# Patient Record
Sex: Female | Born: 1960 | Race: Black or African American | Hispanic: No | Smoking: Never smoker
Health system: Southern US, Community
[De-identification: ages and names within clinical notes are randomized; demographics above are authoritative.]

## PROBLEM LIST (undated history)

## (undated) DIAGNOSIS — M810 Age-related osteoporosis without current pathological fracture: Secondary | ICD-10-CM

## (undated) DIAGNOSIS — I10 Essential (primary) hypertension: Secondary | ICD-10-CM

## (undated) DIAGNOSIS — F419 Anxiety disorder, unspecified: Secondary | ICD-10-CM

## (undated) DIAGNOSIS — K219 Gastro-esophageal reflux disease without esophagitis: Secondary | ICD-10-CM

## (undated) DIAGNOSIS — M8580 Other specified disorders of bone density and structure, unspecified site: Secondary | ICD-10-CM

## (undated) DIAGNOSIS — K5790 Diverticulosis of intestine, part unspecified, without perforation or abscess without bleeding: Secondary | ICD-10-CM

## (undated) DIAGNOSIS — R7303 Prediabetes: Secondary | ICD-10-CM

## (undated) DIAGNOSIS — M1990 Unspecified osteoarthritis, unspecified site: Secondary | ICD-10-CM

## (undated) DIAGNOSIS — R112 Nausea with vomiting, unspecified: Secondary | ICD-10-CM

## (undated) DIAGNOSIS — D496 Neoplasm of unspecified behavior of brain: Secondary | ICD-10-CM

## (undated) DIAGNOSIS — Z9889 Other specified postprocedural states: Secondary | ICD-10-CM

## (undated) HISTORY — DX: Other specified disorders of bone density and structure, unspecified site: M85.80

## (undated) HISTORY — DX: Diverticulosis of intestine, part unspecified, without perforation or abscess without bleeding: K57.90

## (undated) HISTORY — DX: Gastro-esophageal reflux disease without esophagitis: K21.9

## (undated) HISTORY — DX: Anxiety disorder, unspecified: F41.9

## (undated) HISTORY — PX: BRAIN SURGERY: SHX531

## (undated) HISTORY — DX: Essential (primary) hypertension: I10

## (undated) HISTORY — PX: UPPER GASTROINTESTINAL ENDOSCOPY: SHX188

## (undated) HISTORY — DX: Other specified postprocedural states: Z98.890

## (undated) HISTORY — PX: PELVIC LAPAROSCOPY: SHX162

## (undated) HISTORY — DX: Age-related osteoporosis without current pathological fracture: M81.0

## (undated) HISTORY — PX: ENDOMETRIAL ABLATION: SHX621

## (undated) HISTORY — DX: Neoplasm of unspecified behavior of brain: D49.6

## (undated) HISTORY — DX: Nausea with vomiting, unspecified: R11.2

## (undated) HISTORY — PX: COLONOSCOPY: SHX174

## (undated) HISTORY — PX: TUBAL LIGATION: SHX77

---

## 1997-09-02 HISTORY — PX: PELVIC LAPAROSCOPY: SHX162

## 1997-10-30 ENCOUNTER — Inpatient Hospital Stay (HOSPITAL_COMMUNITY): Admission: AD | Admit: 1997-10-30 | Discharge: 1997-10-30 | Payer: Self-pay | Admitting: Gynecology

## 1999-03-13 ENCOUNTER — Other Ambulatory Visit: Admission: RE | Admit: 1999-03-13 | Discharge: 1999-03-13 | Payer: Self-pay | Admitting: Gynecology

## 1999-03-29 ENCOUNTER — Other Ambulatory Visit: Admission: RE | Admit: 1999-03-29 | Discharge: 1999-03-29 | Payer: Self-pay | Admitting: Gynecology

## 1999-03-29 ENCOUNTER — Encounter (INDEPENDENT_AMBULATORY_CARE_PROVIDER_SITE_OTHER): Payer: Self-pay

## 1999-05-18 ENCOUNTER — Ambulatory Visit (HOSPITAL_COMMUNITY): Admission: RE | Admit: 1999-05-18 | Discharge: 1999-05-18 | Payer: Self-pay | Admitting: Gynecology

## 1999-05-18 ENCOUNTER — Encounter (INDEPENDENT_AMBULATORY_CARE_PROVIDER_SITE_OTHER): Payer: Self-pay

## 2001-02-20 ENCOUNTER — Other Ambulatory Visit: Admission: RE | Admit: 2001-02-20 | Discharge: 2001-02-20 | Payer: Self-pay | Admitting: Gynecology

## 2003-06-29 ENCOUNTER — Other Ambulatory Visit: Admission: RE | Admit: 2003-06-29 | Discharge: 2003-06-29 | Payer: Self-pay | Admitting: Obstetrics and Gynecology

## 2004-07-30 ENCOUNTER — Other Ambulatory Visit: Admission: RE | Admit: 2004-07-30 | Discharge: 2004-07-30 | Payer: Self-pay | Admitting: Obstetrics and Gynecology

## 2008-01-12 ENCOUNTER — Other Ambulatory Visit: Admission: RE | Admit: 2008-01-12 | Discharge: 2008-01-12 | Payer: Self-pay | Admitting: Gynecology

## 2009-03-23 ENCOUNTER — Encounter: Payer: Self-pay | Admitting: Gynecology

## 2009-03-23 ENCOUNTER — Ambulatory Visit: Payer: Self-pay | Admitting: Gynecology

## 2009-03-23 ENCOUNTER — Other Ambulatory Visit: Admission: RE | Admit: 2009-03-23 | Discharge: 2009-03-23 | Payer: Self-pay | Admitting: Gynecology

## 2010-04-12 ENCOUNTER — Other Ambulatory Visit: Admission: RE | Admit: 2010-04-12 | Discharge: 2010-04-12 | Payer: Self-pay | Admitting: Gynecology

## 2010-04-12 ENCOUNTER — Ambulatory Visit: Payer: Self-pay | Admitting: Gynecology

## 2010-11-01 HISTORY — PX: SHOULDER SURGERY: SHX246

## 2010-11-08 ENCOUNTER — Encounter (HOSPITAL_BASED_OUTPATIENT_CLINIC_OR_DEPARTMENT_OTHER)
Admission: RE | Admit: 2010-11-08 | Discharge: 2010-11-08 | Disposition: A | Discharge status: Home / Self Care | Payer: BC Managed Care – PPO | Source: Ambulatory Visit | Attending: Orthopaedic Surgery | Admitting: Orthopaedic Surgery

## 2010-11-08 LAB — BASIC METABOLIC PANEL
CO2: 28 mEq/L (ref 19–32)
Calcium: 9.1 mg/dL (ref 8.4–10.5)
Creatinine, Ser: 0.88 mg/dL (ref 0.4–1.2)
GFR calc Af Amer: >60 mL/min (ref >60)
Sodium: 136 mEq/L (ref 135–145)

## 2010-11-13 ENCOUNTER — Ambulatory Visit (HOSPITAL_BASED_OUTPATIENT_CLINIC_OR_DEPARTMENT_OTHER)
Admission: RE | Admit: 2010-11-13 | Discharge: 2010-11-13 | Disposition: A | Discharge status: Home / Self Care | Payer: BC Managed Care – PPO | Source: Ambulatory Visit | Attending: Orthopaedic Surgery | Admitting: Orthopaedic Surgery

## 2010-11-13 DIAGNOSIS — M7500 Adhesive capsulitis of unspecified shoulder: Secondary | ICD-10-CM | POA: Insufficient documentation

## 2010-11-13 DIAGNOSIS — Z01818 Encounter for other preprocedural examination: Secondary | ICD-10-CM | POA: Insufficient documentation

## 2010-11-13 DIAGNOSIS — Z01812 Encounter for preprocedural laboratory examination: Secondary | ICD-10-CM | POA: Insufficient documentation

## 2010-11-13 DIAGNOSIS — M25819 Other specified joint disorders, unspecified shoulder: Secondary | ICD-10-CM | POA: Insufficient documentation

## 2010-11-14 LAB — POCT HEMOGLOBIN-HEMACUE: Hemoglobin: 13.5 g/dL (ref 12.0–15.0)

## 2010-12-08 NOTE — Op Note (Signed)
[place], [address]T               ACCOUNT NO.:  [id number]  MEDICAL RECORD NO.:  [id number]            PATIENT TYPE:  LOCATION:                                 FACILITY:  PHYSICIAN:  Lubertha Basque. Jerl Santos, M.D.     DATE OF BIRTH:  DATE OF PROCEDURE:  11/13/2010 DATE OF DISCHARGE:                              OPERATIVE REPORT   PREOPERATIVE DIAGNOSES: 1. Left shoulder adhesive capsulitis. 2. Left shoulder impingement.  POSTOPERATIVE DIAGNOSES: 1. Left shoulder adhesive capsulitis. 2. Left shoulder impingement.  PROCEDURES: 1. Left shoulder arthroscopic lysis adhesions. 2. Left shoulder manipulation. 3. Left shoulder arthroscopic acromioplasty. 4. Left shoulder arthroscopic partial claviculectomy.  ANESTHESIA:  General and block.  ATTENDING SURGEON:  Lubertha Basque. Jerl Santos, MD  ASSISTANT:  Lindwood Qua, PA   INDICATIONS FOR PROCEDURE:  The patient is a 50 year old woman with 1 year of left shoulder pain and stiffness.  This has persisted despite two glenohumeral injections and several months of physical therapy.  She has stiffness, which limits her ability to use her arm.  She cannot reach her head to do her hair or brush her teeth.  She has terrible pain at night.  She is offered an arthroscopy with manipulation.  Informed operative consent was obtained after discussion of possible complications including reaction to anesthesia and infection.  SUMMARY OF FINDINGS AND PROCEDURE:  Under general anesthesia and a block, the left shoulder arthroscopy was performed.  The glenohumeral joint had no degenerative changes, but she had extensive adhesions throughout.  I cleared the interval and performed a capsular release from the biceps down to the axillary recess leaving the labral tissues intact.  I also performed a release on the posterior direction as well. In the subacromial space, I removed some adhesions and performed an acromioplasty along with the partial claviculectomy.   We then finished with manipulation for the last bit of forward flexion.  We were able to regain all of her shoulder motion.  DESCRIPTION OF PROCEDURE:  The patient was taken to the operating suite where general anesthetic was applied without difficulty.  She was also given a block in the preanesthesia area.  She was positioned in beach- chair position and prepped and draped in normal sterile fashion.  After administration of IV Kefzol, an arthroscopy of the left shoulder was performed through the total of three portals.  Findings were as noted above and procedure consisted mostly of the removal of adhesions.  This was done in a controlled fashion with an ArthroCare wand and a shaver. About 5 mm off the glenoid, we performed capsular releases removing the thick scar tissue.  The biceps tendon and rotator cuff appeared intact as was the underlying subscapularis tendon, which we completely freed up as well.  Once these adhesions had been removed, her external rotation improved from 0 to 45 in the forward flexion and went from about 90 to 120.  We then did a bit of manipulation.     Lubertha Basque Jerl Santos, M.D.     PGD/MEDQ  D:  11/13/2010  T:  11/14/2010  Job:  [id number]  Electronically Signed by Marcene Corning  M.D. on 12/08/2010 12:42:33 PM

## 2010-12-08 NOTE — Op Note (Signed)
  Cristina Conner, Cristina Conner               ACCOUNT NO.:  [id number]  MEDICAL RECORD NO.:  [id number]           PATIENT TYPE:  LOCATION:                                 FACILITY:  PHYSICIAN:  Lubertha Basque. Karynn Deblasi, M.D.     DATE OF BIRTH:  DATE OF PROCEDURE:  11/13/2010 DATE OF DISCHARGE:                              OPERATIVE REPORT   ADDENDUM:  DESCRIPTION OF PROCEDURE:  Once these releases had been completed, her external rotation improved from zero to about 45 degrees and forward flexion improved from 90 to about 120.  I then entered the subacromial space and removed some adhesions there.  We performed a conservative acromioplasty with the bur in the lateral position followed by transfer of the bur to the posterior position.  In a similar fashion, removed the undersurface of the clavicle decompressing at the [facility] joint as well.  We then removed the instruments and I performed a final manipulation regaining all of her motion up to 150 of forward flexion and 70 of external rotation.  We reapproximated the portals loosely with nylon. Adaptic was applied followed by dry gauze and tape.  Estimated blood loss and intraoperative fluids can be obtained from the anesthesia records.  DISPOSITION:  The patient was extubated in the operating room and taken to recovery in stable addition.  She was to go home same-day and follow up in the office in less than a week.  I will contact her by phone tonight.  I would like her to resume physical therapy as soon as possible.     Lubertha Basque Jerl Santos, M.D.     PGD/MEDQ  D:  11/13/2010  T:  11/14/2010  Job:  [id number]  Electronically Signed by Marcene Corning M.D. on 12/08/2010 12:42:57 PM

## 2011-05-02 ENCOUNTER — Ambulatory Visit (INDEPENDENT_AMBULATORY_CARE_PROVIDER_SITE_OTHER): Payer: BC Managed Care – PPO | Admitting: Gynecology

## 2011-05-02 ENCOUNTER — Encounter: Payer: Self-pay | Admitting: Anesthesiology

## 2011-05-02 ENCOUNTER — Encounter: Payer: Self-pay | Admitting: Gynecology

## 2011-05-02 VITALS — BP 128/86

## 2011-05-02 DIAGNOSIS — R300 Dysuria: Secondary | ICD-10-CM

## 2011-05-02 DIAGNOSIS — R82998 Other abnormal findings in urine: Secondary | ICD-10-CM

## 2011-05-02 DIAGNOSIS — N390 Urinary tract infection, site not specified: Secondary | ICD-10-CM

## 2011-05-02 MED ORDER — NITROFURANTOIN MONOHYD MACRO 100 MG PO CAPS: 100.0000 mg | ORAL_CAPSULE | Freq: Two times a day (BID) | ORAL | Status: AC

## 2011-05-02 NOTE — Progress Notes (Signed)
50 year old gravida 2 para 2 who presented to the office today complaining of dysuria and frequency over the course of past 3 days. She denied any vaginal discharge. She denies any back pain. She denied the nausea vomiting fever or chills. Patient was diagnosed in the menopause based on an FSH back in 2011 currently on no hormone replacement therapy and is overdue for her annual exam as well as her mammogram.  Exam: Back: No CVA tenderness Abdomen: Soft nontender no renal or guarding Pelvic: Not done Rectal: Not done  Urinalysis: 20+ WBC, 2+ bacteria, 3-6 RBC.  Assessment: Urinary tract infection  Plan: Uribel 1 by mouth 4 times a day x2 days. Macrobid 100 mg one by mouth twice a day for 7 days. Patient to schedule her mammogram for next month as well as her annual gynecological examination here in the office.

## 2011-05-02 NOTE — Patient Instructions (Signed)
Take Uribel four times a day to help with symptoms. The antibiotic (macrobid) take one tablet two times a day for seven days. Drink plenty of fluids.If you have back pain, fever, or chills will need to see you right away otherwise will see you next month for your annual exam. Remember to schedule your mammogram also.

## 2011-05-20 ENCOUNTER — Encounter: Payer: Self-pay | Admitting: Gynecology

## 2011-05-20 ENCOUNTER — Ambulatory Visit (INDEPENDENT_AMBULATORY_CARE_PROVIDER_SITE_OTHER): Payer: BC Managed Care – PPO | Admitting: Gynecology

## 2011-05-20 ENCOUNTER — Other Ambulatory Visit (HOSPITAL_COMMUNITY)
Admission: RE | Admit: 2011-05-20 | Discharge: 2011-05-20 | Disposition: A | Discharge status: Home / Self Care | Payer: BC Managed Care – PPO | Source: Ambulatory Visit | Attending: Gynecology | Admitting: Gynecology

## 2011-05-20 VITALS — BP 128/80 | Ht 65.5 in | Wt 158.0 lb

## 2011-05-20 DIAGNOSIS — N951 Menopausal and female climacteric states: Secondary | ICD-10-CM | POA: Insufficient documentation

## 2011-05-20 DIAGNOSIS — I10 Essential (primary) hypertension: Secondary | ICD-10-CM | POA: Insufficient documentation

## 2011-05-20 DIAGNOSIS — Z01419 Encounter for gynecological examination (general) (routine) without abnormal findings: Secondary | ICD-10-CM

## 2011-05-20 NOTE — Progress Notes (Signed)
Cristina Conner 20-Jun-1961 [id number]   History:    50 y.o.  for annual exam her only complaint was that time she has swelling episodes 2-3 times a week. In July 2010 she was diagnosed as being menopausal based on an elevated FSH with a value of 83. Patient state that her vasomotor symptoms or foreign in between she's not interested in hormonal replacement therapy. She is taken her calcium and vitamin D one tablet daily. She does her monthly self breast examinations. And her mammogram scheduled for later today. Her weight has remained the same since last year. She's been followed by Dr. Andi Devon her primary physician which she will see next month who will be drawn on her lab work.  Past medical history,surgical history, family history and social history were all reviewed and documented in the EPIC chart.  ROS:  Was performed and pertinent positives and negatives are included in the history.  Exam: chaperone present BP 128/80  Ht 5' 5.5" (1.664 m)  Wt 158 lb (71.668 kg)  BMI 25.89 kg/m2  LMP 05/01/2009  Body mass index is 25.89 kg/(m^2).  General appearance : Well developed well nourished female. No acute distress HEENT: Neck supple, trachea midline, no carotid bruits, no thyroidmegaly Lungs: Clear to auscultation, no rhonchi or wheezes, or rib retractions  Heart: Regular rate and rhythm, no murmurs or gallops Breast:Examined in sitting and supine position were symmetrical in appearance, no palpable masses or tenderness,  no skin retraction, no nipple inversion, no nipple discharge, no skin discoloration, no axillary or supraclavicular lymphadenopathy Abdomen: no palpable masses or tenderness, no rebound or guarding Extremities: no edema or skin discoloration or tenderness  Pelvic:  Bartholin, Urethra, Skene Glands: Within normal limits             Vagina: No gross lesions or discharge  Cervix: No gross lesions or discharge  Uterus  anteverted, normal size, shape and consistency,  non-tender and mobile  Adnexa  Without masses or tenderness  Anus and perineum  normal   Rectovaginal  normal sphincter tone without palpated masses or tenderness             Hemoccult not done     Assessment/Plan:  50 y.o. female for annual exam menopausal with minimal vasomotor symptoms on no hormone replacement therapy. Normal examination today. Patient is encouraged to continue monthly self as examination. Patient to followup with her mammogram later today. Patient to followup with her primary physician Dr. Renae Gloss next month and to have them sent [place] a copy of her labs we can obtain our records. Pap smear done today will followup in one year or when necessary.    Ok Edwards MD, 9:30 AM 05/20/2011

## 2011-05-21 ENCOUNTER — Encounter: Payer: Self-pay | Admitting: Gynecology

## 2012-08-08 ENCOUNTER — Emergency Department (HOSPITAL_COMMUNITY)
Admission: EM | Admit: 2012-08-08 | Discharge: 2012-08-08 | Disposition: A | Discharge status: Home / Self Care | Payer: BC Managed Care – PPO | Attending: Emergency Medicine | Admitting: Emergency Medicine

## 2012-08-08 ENCOUNTER — Encounter (HOSPITAL_COMMUNITY): Payer: Self-pay | Admitting: *Deleted

## 2012-08-08 DIAGNOSIS — R109 Unspecified abdominal pain: Secondary | ICD-10-CM | POA: Insufficient documentation

## 2012-08-08 DIAGNOSIS — R82998 Other abnormal findings in urine: Secondary | ICD-10-CM | POA: Insufficient documentation

## 2012-08-08 DIAGNOSIS — Z79899 Other long term (current) drug therapy: Secondary | ICD-10-CM | POA: Insufficient documentation

## 2012-08-08 DIAGNOSIS — R8271 Bacteriuria: Secondary | ICD-10-CM

## 2012-08-08 DIAGNOSIS — N200 Calculus of kidney: Secondary | ICD-10-CM

## 2012-08-08 LAB — URINALYSIS, ROUTINE W REFLEX MICROSCOPIC
Glucose, UA: NEGATIVE mg/dL
Ketones, ur: NEGATIVE mg/dL
Nitrite: NEGATIVE
Protein, ur: NEGATIVE mg/dL
Urobilinogen, UA: 1 mg/dL (ref 0.0–1.0)

## 2012-08-08 LAB — URINE MICROSCOPIC-ADD ON

## 2012-08-08 LAB — BASIC METABOLIC PANEL
CO2: 26 mEq/L (ref 19–32)
Calcium: 9.6 mg/dL (ref 8.4–10.5)
Creatinine, Ser: 0.77 mg/dL (ref 0.50–1.10)
GFR calc Af Amer: >90 mL/min (ref >90)
GFR calc non Af Amer: >90 mL/min (ref >90)
Sodium: 137 mEq/L (ref 135–145)

## 2012-08-08 MED ORDER — TAMSULOSIN HCL 0.4 MG PO CAPS: 0.4000 mg | ORAL_CAPSULE | Freq: Two times a day (BID) | ORAL | Status: DC

## 2012-08-08 MED ORDER — KETOROLAC TROMETHAMINE 60 MG/2ML IM SOLN
60.0000 mg | Freq: Once | INTRAMUSCULAR | Status: AC
  Administered 2012-08-08: 60 mg via INTRAMUSCULAR
  Filled 2012-08-08: qty 2

## 2012-08-08 MED ORDER — SULFAMETHOXAZOLE-TMP DS 800-160 MG PO TABS
1.0000 | ORAL_TABLET | Freq: Once | ORAL | Status: AC
  Administered 2012-08-08: 1 via ORAL
  Filled 2012-08-08: qty 1

## 2012-08-08 MED ORDER — HYDROCODONE-ACETAMINOPHEN 5-325 MG PO TABS: 2.0000 | ORAL_TABLET | ORAL | Status: DC | PRN

## 2012-08-08 MED ORDER — SULFAMETHOXAZOLE-TRIMETHOPRIM 800-160 MG PO TABS: 1.0000 | ORAL_TABLET | Freq: Two times a day (BID) | ORAL | Status: DC

## 2012-08-08 MED ORDER — PROMETHAZINE HCL 25 MG PO TABS: 25.0000 mg | ORAL_TABLET | Freq: Four times a day (QID) | ORAL | Status: DC | PRN

## 2012-08-08 MED ORDER — NAPROXEN 500 MG PO TABS: 500.0000 mg | ORAL_TABLET | Freq: Two times a day (BID) | ORAL | Status: DC

## 2012-08-08 NOTE — ED Provider Notes (Signed)
History     CSN: [id number]  Arrival date & time 08/08/12  0033   First MD Initiated Contact with Patient 08/08/12 0154      Chief Complaint  Patient presents with  . Emesis  . Flank Pain    (Consider location/radiation/quality/duration/timing/severity/associated sxs/prior treatment) HPI Comments: 51 year old female who presents with a complaint of right flank pain which was sudden in onset, associated with nausea and diaphoresis with a sharp and stabbing pain that radiated from her flank down to her right groin. This lasted for approximately an hour and half and resolved spontaneously. At this time she has no symptoms, no complaints and has not had any hematuria, fevers or dysuria. She has no history of kidney stones.  Patient is a 51 y.o. female presenting with vomiting and flank pain. The history is provided by the patient.  Emesis  Pertinent negatives include no fever.  Flank Pain    History reviewed. No pertinent past medical history.  Past Surgical History  Procedure Date  . Cesarean section     x 2  . Endometrial ablation   . Tubal ligation   . Pelvic laparoscopy 1999    left salpingectomy, excision of Right, paratubal cyst  . Pelvic laparoscopy     laparoscopy with lysis of pelvic adhesions  . Shoulder surgery 11/2010    "FROZEN SHOULDER"    Family History  Problem Relation Age of Onset  . Hypertension Mother   . Heart disease Mother   . Diabetes Mother   . Hypertension Father     History  Substance Use Topics  . Smoking status: Never Smoker   . Smokeless tobacco: Never Used  . Alcohol Use: Yes     Comment: 3 GLASSES OF WINE A MONTH    OB History    Grav Para Term Preterm Abortions TAB SAB Ect Mult Living   2 2        2       Review of Systems  Constitutional: Negative for fever.  Gastrointestinal: Positive for nausea and vomiting.  Genitourinary: Positive for flank pain. Negative for dysuria and hematuria.    Allergies  Review of patient's  allergies indicates no known allergies.  Home Medications   Current Outpatient Rx  Name  Route  Sig  Dispense  Refill  . VITAMIN D 1000 UNITS PO TABS   Oral   Take 1,000 Units by mouth daily.           . OMEGA-3 FATTY ACIDS 1000 MG PO CAPS   Oral   Take 2 g by mouth daily.           Marland Kitchen LISINOPRIL-HYDROCHLOROTHIAZIDE 10-12.5 MG PO TABS   Oral   Take 1 tablet by mouth daily.           . CENTRUM PO TABS   Oral   Take 1 tablet by mouth daily.           Marland Kitchen POTASSIUM CHLORIDE 10 MEQ PO TBCR   Oral   Take 10 mEq by mouth daily.           Marland Kitchen ESZOPICLONE 1 MG PO TABS   Oral   Take 1 mg by mouth at bedtime as needed. For sleep. Take immediately before bedtime         . HYDROCODONE-ACETAMINOPHEN 5-325 MG PO TABS   Oral   Take 2 tablets by mouth every 4 (four) hours as needed for pain.   10 tablet   0   .  NAPROXEN 500 MG PO TABS   Oral   Take 1 tablet (500 mg total) by mouth 2 (two) times daily with a meal.   30 tablet   0   . PROMETHAZINE HCL 25 MG PO TABS   Oral   Take 1 tablet (25 mg total) by mouth every 6 (six) hours as needed for nausea.   12 tablet   0   . SULFAMETHOXAZOLE-TRIMETHOPRIM 800-160 MG PO TABS   Oral   Take 1 tablet by mouth every 12 (twelve) hours.   10 tablet   0   . TAMSULOSIN HCL 0.4 MG PO CAPS   Oral   Take 1 capsule (0.4 mg total) by mouth 2 (two) times daily.   10 capsule   0     BP 141/79  Pulse 82  Temp 98 F (36.7 C)  Resp 16  SpO2 99%  LMP 05/01/2009  Physical Exam  Nursing note and vitals reviewed. Constitutional: She appears well-developed and well-nourished. No distress.  HENT:  Head: Normocephalic and atraumatic.  Mouth/Throat: Oropharynx is clear and moist. No oropharyngeal exudate.  Eyes: Conjunctivae normal and EOM are normal. Pupils are equal, round, and reactive to light. Right eye exhibits no discharge. Left eye exhibits no discharge. No scleral icterus.  Neck: Normal range of motion. Neck supple. No JVD  present. No thyromegaly present.  Cardiovascular: Normal rate, regular rhythm, normal heart sounds and intact distal pulses.  Exam reveals no gallop and no friction rub.   No murmur heard. Pulmonary/Chest: Effort normal and breath sounds normal. No respiratory distress. She has no wheezes. She has no rales.  Abdominal: Soft. Bowel sounds are normal. She exhibits no distension and no mass. There is no tenderness.       Nontender abdomen, no CVA tenderness  Musculoskeletal: Normal range of motion. She exhibits no edema and no tenderness.  Lymphadenopathy:    She has no cervical adenopathy.  Neurological: She is alert. Coordination normal.  Skin: Skin is warm and dry. No rash noted. No erythema.  Psychiatric: She has a normal mood and affect. Her behavior is normal.    ED Course  Procedures (including critical care time)  Labs Reviewed  URINALYSIS, ROUTINE W REFLEX MICROSCOPIC - Abnormal; Notable for the following:    APPearance CLOUDY (*)     Hgb urine dipstick MODERATE (*)     Leukocytes, UA TRACE (*)     All other components within normal limits  BASIC METABOLIC PANEL - Abnormal; Notable for the following:    Potassium 3.4 (*)     Glucose, Bld 131 (*)     All other components within normal limits  URINE MICROSCOPIC-ADD ON - Abnormal; Notable for the following:    Bacteria, UA MANY (*)     All other components within normal limits  URINE CULTURE   No results found.   1. Kidney stone   2. Bacteria in urine       MDM  Overall the patient appears well with normal vital signs other than mild hypertension, her history suggests that she has had a kidney stone which is possibly passed, she appears stable, she declines CAT scan at this time in lieu of conservative therapy, urinalysis pending, basic metabolic panel to evaluate renal function, Toradol. I described to the patient the need for return should her symptoms worsen and she may need CT scan to evaluate size of kidney stone and  she is given this is her option of her choosing this evening.  She understands indications for return and has expressed her understanding verbally to me   The patient has had no pain since arrival, she has been given Bactrim for the bacteriuria in the presence of a kidney stone, hematuria is present with 21-50 red blood cells, no renal dysfunction, stable for discharge, prescriptions given  Discharge Prescriptions include:  Naprosyn Hydrocodone Flomax Phenergan Bactrim      Vida Roller, MD 08/08/12 [phone]

## 2012-08-08 NOTE — ED Notes (Signed)
Pt alert, arrives from home, c/o right flank pain, emesis, onset was this evening, resp even unlabored, skin pwd,

## 2012-08-09 LAB — URINE CULTURE
Colony Count: NO GROWTH
Culture: NO GROWTH

## 2012-08-24 ENCOUNTER — Encounter: Payer: Self-pay | Admitting: Gynecology

## 2012-08-24 ENCOUNTER — Ambulatory Visit (INDEPENDENT_AMBULATORY_CARE_PROVIDER_SITE_OTHER): Payer: BC Managed Care – PPO | Admitting: Gynecology

## 2012-08-24 VITALS — BP 136/98 | Ht 66.0 in | Wt 154.0 lb

## 2012-08-24 DIAGNOSIS — Z23 Encounter for immunization: Secondary | ICD-10-CM

## 2012-08-24 DIAGNOSIS — Z01419 Encounter for gynecological examination (general) (routine) without abnormal findings: Secondary | ICD-10-CM

## 2012-08-24 DIAGNOSIS — N951 Menopausal and female climacteric states: Secondary | ICD-10-CM

## 2012-08-24 NOTE — Progress Notes (Signed)
Cristina Conner 07-12-1961 [id number]   History:    51 y.o.  for annual with no complaints today. She scheduled to see her primary physician Dr. Andi Devon the end of the month and we'll be doing her lab work. Patient  With history of hypertension did not take her medication today. She stayed 3 weeks ago she had gone to the emergency room for right nephrolithiasis and passed a kidney stone. Patient is now 51 years of age and he is a screening colonoscopy. Patient would know prior history of abnormal Pap smears. She had her flu vaccine a few months ago. She does not recall ever having received a Tdap vaccine. She has been menopausal since 2010 has proven by elevated FSH and has not been on any hormone replacement therapy and her vasomotor symptoms consist of one or 2 a day if any hot flashes otherwise doing well. She's had a previous tubal ligation in the past. And she's also had history of resectoscopic polypectomy and endometrial ablation.  Past medical history,surgical history, family history and social history were all reviewed and documented in the EPIC chart.  Gynecologic History Patient's last menstrual period was 05/01/2009. Contraception: tubal ligation Last Pap: 2012. Results were: normal Last mammogram: 2012. Results were: normal  Obstetric History OB History    Grav Para Term Preterm Abortions TAB SAB Ect Mult Living   2 2        2      # Outc Date GA Lbr Len/2nd Wgt Sex Del Anes PTL Lv   1 PAR            2 PAR                ROS: A ROS was performed and pertinent positives and negatives are included in the history.  GENERAL: No fevers or chills. HEENT: No change in vision, no earache, sore throat or sinus congestion. NECK: No pain or stiffness. CARDIOVASCULAR: No chest pain or pressure. No palpitations. PULMONARY: No shortness of breath, cough or wheeze. GASTROINTESTINAL: No abdominal pain, nausea, vomiting or diarrhea, melena or bright red blood per rectum. GENITOURINARY: No  urinary frequency, urgency, hesitancy or dysuria. MUSCULOSKELETAL: No joint or muscle pain, no back pain, no recent trauma. DERMATOLOGIC: No rash, no itching, no lesions. ENDOCRINE: No polyuria, polydipsia, no heat or cold intolerance. No recent change in weight. HEMATOLOGICAL: No anemia or easy bruising or bleeding. NEUROLOGIC: No headache, seizures, numbness, tingling or weakness. PSYCHIATRIC: No depression, no loss of interest in normal activity or change in sleep pattern.     Exam: chaperone present  BP 136/98  Ht 5\' 6"  (1.676 m)  Wt 154 lb (69.854 kg)  BMI 24.86 kg/m2  LMP 05/01/2009  Body mass index is 24.86 kg/(m^2).  General appearance : Well developed well nourished female. No acute distress HEENT: Neck supple, trachea midline, no carotid bruits, no thyroidmegaly Lungs: Clear to auscultation, no rhonchi or wheezes, or rib retractions  Heart: Regular rate and rhythm, no murmurs or gallops Breast:Examined in sitting and supine position were symmetrical in appearance, no palpable masses or tenderness,  no skin retraction, no nipple inversion, no nipple discharge, no skin discoloration, no axillary or supraclavicular lymphadenopathy Abdomen: no palpable masses or tenderness, no rebound or guarding Extremities: no edema or skin discoloration or tenderness  Pelvic:  Bartholin, Urethra, Skene Glands: Within normal limits             Vagina: No gross lesions or discharge  Cervix: No gross lesions or  discharge  Uterus  anteverted, normal size, shape and consistency, non-tender and mobile  Adnexa  Without masses or tenderness  Anus and perineum  normal   Rectovaginal  normal sphincter tone without palpated masses or tenderness             Hemoccult cards provided     Assessment/Plan:  51 y.o. female for annual exam who was given the name of one of my GI colleagues for her to schedule an appointment for screening colonoscopy. Her lab work will be drawn by her PCP at the end of this  month. She will take her blood pressure medication that she did not take this morning. No Pap smear done today. New screening guidelines discussed. She will receive the dTap Vaccine today. We discussed importance of calcium and vitamin D and regular exercise for osteoporosis prevention. Bone density study will be schedule here in our office next month. She was scheduled for mammogram for next month. She was reminded to submit to the office the Hemoccult cards for testing. Tdap vaccine was administered today.    Ok Edwards MD, 2:45 PM 08/24/2012

## 2012-08-24 NOTE — Patient Instructions (Addendum)

## 2012-08-28 ENCOUNTER — Encounter: Payer: Self-pay | Admitting: Gynecology

## 2012-09-04 ENCOUNTER — Encounter: Payer: Self-pay | Admitting: Gynecology

## 2012-09-07 ENCOUNTER — Encounter: Payer: Self-pay | Admitting: Internal Medicine

## 2012-09-14 ENCOUNTER — Encounter: Payer: Self-pay | Admitting: Gynecology

## 2012-09-28 ENCOUNTER — Ambulatory Visit (AMBULATORY_SURGERY_CENTER): Payer: BC Managed Care – PPO | Admitting: *Deleted

## 2012-09-28 VITALS — Ht 66.0 in | Wt 163.8 lb

## 2012-09-28 DIAGNOSIS — Z1211 Encounter for screening for malignant neoplasm of colon: Secondary | ICD-10-CM

## 2012-09-28 MED ORDER — SUPREP BOWEL PREP KIT 17.5-3.13-1.6 GM/177ML PO SOLN: ORAL | Status: DC

## 2012-10-06 ENCOUNTER — Ambulatory Visit (INDEPENDENT_AMBULATORY_CARE_PROVIDER_SITE_OTHER): Payer: BC Managed Care – PPO

## 2012-10-06 DIAGNOSIS — N951 Menopausal and female climacteric states: Secondary | ICD-10-CM

## 2012-10-06 DIAGNOSIS — M949 Disorder of cartilage, unspecified: Secondary | ICD-10-CM

## 2012-10-06 DIAGNOSIS — M899 Disorder of bone, unspecified: Secondary | ICD-10-CM

## 2012-10-06 DIAGNOSIS — M8580 Other specified disorders of bone density and structure, unspecified site: Secondary | ICD-10-CM

## 2012-10-13 ENCOUNTER — Ambulatory Visit (AMBULATORY_SURGERY_CENTER): Payer: BC Managed Care – PPO | Admitting: Internal Medicine

## 2012-10-13 ENCOUNTER — Encounter: Payer: Self-pay | Admitting: Internal Medicine

## 2012-10-13 VITALS — BP 101/69 | HR 74 | Temp 97.7°F | Resp 14 | Ht 66.0 in | Wt 163.0 lb

## 2012-10-13 DIAGNOSIS — Z1211 Encounter for screening for malignant neoplasm of colon: Secondary | ICD-10-CM

## 2012-10-13 MED ORDER — SODIUM CHLORIDE 0.9 % IV SOLN: 500.0000 mL | INTRAVENOUS | Status: DC

## 2012-10-13 NOTE — Progress Notes (Signed)
Lidocaine-40mg IV prior to Propofol InductionPropofol given over incremental dosages 

## 2012-10-13 NOTE — Op Note (Signed)
 [facility] [address] [place], [address]   COLONOSCOPY PROCEDURE REPORT  PATIENT: Cristina Conner, Cristina Conner  MR#: [id number] BIRTHDATE: 18-Dec-1960 , 51  yrs. old GENDER: Female ENDOSCOPIST: Iva Boop, MD, [facility] REFERRED Wyvonnia Dusky, M.D. PROCEDURE DATE:  10/13/2012 PROCEDURE:   Colonoscopy, screening ASA CLASS:   Class II INDICATIONS:average risk screening. MEDICATIONS: propofol (Diprivan) 150mg  IV, MAC sedation, administered by CRNA, and These medications were titrated to patient response per physician's verbal order  DESCRIPTION OF PROCEDURE:   After the risks benefits and alternatives of the procedure were thoroughly explained, informed consent was obtained.  A digital rectal exam revealed no abnormalities of the rectum.   The LB [id number]  endoscope was introduced through the anus and advanced to the cecum, which was identified by both the appendix and ileocecal valve. No adverse events experienced.   The quality of the prep was Suprep excellent The instrument was then slowly withdrawn as the colon was fully examined.      COLON FINDINGS: There was mild scattered diverticulosis noted in the ascending colon.   The colon mucosa was otherwise normal.   A right colon retroflexion was performed.  Retroflexed views revealed no abnormalities. The time to cecum=2 minutes 33 seconds.  Withdrawal time=8 minutes 19 seconds.  The scope was withdrawn and the procedure completed. COMPLICATIONS: There were no complications.  ENDOSCOPIC IMPRESSION: 1.   There was mild diverticulosis noted in the ascending colon 2.   The colon mucosa was otherwise normal - excellent prep  RECOMMENDATIONS: Repeat Colonscopy in 10 years.   eSigned:  Iva Boop, MD, [facility] 10/13/2012 9:47 AM   cc: Kellie Shropshire, MD and The Patient

## 2012-10-13 NOTE — Progress Notes (Signed)
Patient did not experience any of the following events: a burn prior to discharge; a fall within the facility; wrong site/side/patient/procedure/implant event; or a hospital transfer or hospital admission upon discharge from the facility. (G8907) Patient did not have preoperative order for IV antibiotic SSI prophylaxis. (G8918)  

## 2012-10-13 NOTE — Patient Instructions (Addendum)
The colonoscopy was normal except for very mild diverticulosis and you had a great prep.  Next routine colonoscopy in 10 years.  Thank you for choosing me and [facility].  Iva Boop, MD, FACG  YOU HAD AN ENDOSCOPIC PROCEDURE TODAY AT THE [facility]: Refer to the procedure report that was given to you for any specific questions about what was found during the examination.  If the procedure report does not answer your questions, please call your gastroenterologist to clarify.  If you requested that your care partner not be given the details of your procedure findings, then the procedure report has been included in a sealed envelope for you to review at your convenience later.  YOU SHOULD EXPECT: Some feelings of bloating in the abdomen. Passage of more gas than usual.  Walking can help get rid of the air that was put into your GI tract during the procedure and reduce the bloating. If you had a lower endoscopy (such as a colonoscopy or flexible sigmoidoscopy) you may notice spotting of blood in your stool or on the toilet paper. If you underwent a bowel prep for your procedure, then you may not have a normal bowel movement for a few days.  DIET: Your first meal following the procedure should be a light meal and then it is ok to progress to your normal diet.  A half-sandwich or bowl of soup is an example of a good first meal.  Heavy or fried foods are harder to digest and may make you feel nauseous or bloated.  Likewise meals heavy in dairy and vegetables can cause extra gas to form and this can also increase the bloating.  Drink plenty of fluids but you should avoid alcoholic beverages for 24 hours.  ACTIVITY: Your care partner should take you home directly after the procedure.  You should plan to take it easy, moving slowly for the rest of the day.  You can resume normal activity the day after the procedure however you should NOT DRIVE or use heavy machinery for 24 hours  (because of the sedation medicines used during the test).    SYMPTOMS TO REPORT IMMEDIATELY: A gastroenterologist can be reached at any hour.  During normal business hours, 8:30 AM to 5:00 PM Monday through Friday, call [phone].  After hours and on weekends, please call the GI answering service at [phone] who will take a message and have the physician on call contact you.   Following lower endoscopy (colonoscopy or flexible sigmoidoscopy):  Excessive amounts of blood in the stool  Significant tenderness or worsening of abdominal pains  Swelling of the abdomen that is new, acute  Fever of 100F or higher FOLLOW UP: Our staff will call the home number listed on your records the next business day following your procedure to check on you and address any questions or concerns that you may have at that time regarding the information given to you following your procedure. This is a courtesy call and so if there is no answer at the home number and we have not heard from you through the emergency physician on call, we will assume that you have returned to your regular daily activities without incident.  SIGNATURES/CONFIDENTIALITY: You and/or your care partner have signed paperwork which will be entered into your electronic medical record.  These signatures attest to the fact that that the information above on your After Visit Summary has been reviewed and is understood.  Full responsibility of the confidentiality  of this discharge information lies with you and/or your care-partner.

## 2012-10-14 ENCOUNTER — Telehealth: Payer: Self-pay | Admitting: *Deleted

## 2012-10-14 NOTE — Telephone Encounter (Signed)
Left message that we called for f/u 

## 2013-11-08 ENCOUNTER — Encounter: Payer: Self-pay | Admitting: Gynecology

## 2013-11-08 ENCOUNTER — Ambulatory Visit (INDEPENDENT_AMBULATORY_CARE_PROVIDER_SITE_OTHER): Payer: BC Managed Care – PPO | Admitting: Gynecology

## 2013-11-08 ENCOUNTER — Other Ambulatory Visit (HOSPITAL_COMMUNITY)
Admission: RE | Admit: 2013-11-08 | Discharge: 2013-11-08 | Disposition: A | Discharge status: Home / Self Care | Payer: BC Managed Care – PPO | Source: Ambulatory Visit | Attending: Gynecology | Admitting: Gynecology

## 2013-11-08 VITALS — BP 130/76 | Ht 65.5 in | Wt 169.0 lb

## 2013-11-08 DIAGNOSIS — N951 Menopausal and female climacteric states: Secondary | ICD-10-CM

## 2013-11-08 DIAGNOSIS — Z1151 Encounter for screening for human papillomavirus (HPV): Secondary | ICD-10-CM | POA: Insufficient documentation

## 2013-11-08 DIAGNOSIS — Z01419 Encounter for gynecological examination (general) (routine) without abnormal findings: Secondary | ICD-10-CM | POA: Insufficient documentation

## 2013-11-08 MED ORDER — PAROXETINE MESYLATE 7.5 MG PO CAPS: 7.5000 mg | ORAL_CAPSULE | Freq: Every day | ORAL | Status: DC

## 2013-11-08 NOTE — Patient Instructions (Addendum)
Paroxetine capsules What is this medicine? PAROXETINE (pa ROX e teen) is used to treat hot flashes due to menopause. This medicine may be used for other purposes; ask your health care provider or pharmacist if you have questions. COMMON BRAND NAME(S): Brisdelle What should I tell my health care provider before I take this medicine? They need to know if you have any of these conditions: -bleeding disorders -glaucoma -heart disease -kidney disease -liver disease -low levels of sodium in the blood -mania or bipolar disorder -seizures -suicidal thoughts, plans, or attempt; a previous suicide attempt by you or a family member -take MAOIs like Carbex, Eldepryl, Marplan, Nardil, and Parnate -take medicines that treat or prevent blood clots -an unusual or allergic reaction to paroxetine, other medicines, foods, dyes, or preservatives -pregnant or trying to get pregnant -breast-feeding How should I use this medicine? Take this medicine by mouth once daily at bedtime. Follow the directions on the prescription label. This medicine can be taken with or without food. Take your medicine at regular intervals. Do not take your medicine more often than directed. A special MedGuide will be given to you by the pharmacist with each prescription and refill. Be sure to read this information carefully each time. Overdosage: If you think you've taken too much of this medicine contact a poison control center or emergency room at once. Overdosage: If you think you have taken too much of this medicine contact a poison control center or emergency room at once. NOTE: This medicine is only for you. Do not share this medicine with others. What if I miss a dose? If you miss a dose, take it as soon as you can. If it is almost time for your next dose, take only that dose. Do not take double or extra doses. What may interact with this medicine? Do not take this medicine with any of the following  medications: -linezolid -MAOIs like Carbex, Eldepryl, Marplan, Nardil, and Parnate -methylene blue (injected into a vein) -pimozide -thioridazine This medicine may also interact with the following medications: -alcohol -aspirin and aspirin-like medicines -atomoxetine -certain medicines for depression, anxiety, or psychotic disturbances -certain medicines for irregular heart beat like propafenone, flecainide, encainide, and quinidine -certain medicines for migraine headache like almotriptan, eletriptan, frovatriptan, naratriptan, rizatriptan, sumatriptan, zolmitriptan -cimetidine -digoxin -diuretics -fentanyl -fosamprenavir -furazolidone -isoniazid -lithium -medicines that treat or prevent blood clots like warfarin, enoxaparin, and dalteparin -medicines for sleep -NSAIDs, medicines for pain and inflammation, like ibuprofen or naproxen -phenobarbital -phenytoin -procarbazine -rasagiline -ritonavir -supplements like St. John's wort, kava kava, valerian -tamoxifen -tramadol -tryptophan This list may not describe all possible interactions. Give your health care provider a list of all the medicines, herbs, non-prescription drugs, or dietary supplements you use. Also tell them if you smoke, drink alcohol, or use illegal drugs. Some items may interact with your medicine. What should I watch for while using this medicine? Tell your doctor or healthcare professional if your symptoms do not start to get better or if they get worse. Visit your doctor or health care professional for regular checks on your progress. Patients and their families should watch out for new or worsening thoughts of suicide or depression. Also watch out for sudden changes in feelings such as feeling anxious, agitated, panicky, irritable, hostile, aggressive, impulsive, severely restless, overly excited and hyperactive, or not being able to sleep. If this happens, especially at the beginning of treatment or after a  change in dose, call your health care professional. You may get drowsy or dizzy. Do   not drive, use machinery, or do anything that needs mental alertness until you know how this medicine affects you. Do not stand or sit up quickly, especially if you are an older patient. This reduces the risk of dizzy or fainting spells. Alcohol may interfere with the effect of this medicine. Avoid alcoholic drinks. Your mouth may get dry. Chewing sugarless gum, sucking hard candy and drinking plenty of water will help. Contact your doctor if the problem does not go away or is severe. Women should inform their doctor if they wish to become pregnant or think they might be pregnant. There is a potential for serious side effects to an unborn child. Talk to your health care professional or pharmacist for more information. Do not become pregnant while taking this medicine. What side effects may I notice from receiving this medicine? Side effects that you should report to your doctor or health care professional as soon as possible: -allergic reactions like skin rash, itching or hives, swelling of the face, lips, or tongue -changes in emotions or moods -confusion -depression -feeling faint or lightheaded, falls -seizures -suicidal thoughts or actions -unusual bleeding or bruising -unusually weak or tired -weakness Side effects that usually do not require medical attention (Report these to your doctor or health care professional if they continue or are bothersome.): -change in sex drive or performance -fatigue -drowsiness -headache -insomnia -nausea/vomiting -upset stomach This list may not describe all possible side effects. Call your doctor for medical advice about side effects. You may report side effects to FDA at 1-800-FDA-1088. Where should I keep my medicine? Keep out of the reach of children. Store at room temperature between 20 and 25 degrees C (68 and 77 degrees F). Throw away any unused medicine after  the expiration date. NOTE: This sheet is a summary. It may not cover all possible information. If you have questions about this medicine, talk to your doctor, pharmacist, or health care provider.  2014, Elsevier/Gold Standard. (2012-04-20 12:26:17)  Shingles Vaccine What You Need to Know WHAT IS SHINGLES?  Shingles is a painful skin rash, often with blisters. It is also called Herpes Zoster or just Zoster.  A shingles rash usually appears on one side of the face or body and lasts from 2 to 4 weeks. Its main symptom is pain, which can be quite severe. Other symptoms of shingles can include fever, headache, chills, and upset stomach. Very rarely, a shingles infection can lead to pneumonia, hearing problems, blindness, brain inflammation (encephalitis), or death.  For about 1 person in 5, severe pain can continue even after the rash clears up. This is called post-herpetic neuralgia.  Shingles is caused by the Varicella Zoster virus. This is the same virus that causes chickenpox. Only someone who has had a case of chickenpox or rarely, has gotten chickenpox vaccine, can get shingles. The virus stays in your body. It can reappear many years later to cause a case of shingles.  You cannot catch shingles from another person with shingles. However, a person who has never had chickenpox (or chickenpox vaccine) could get chickenpox from someone with shingles. This is not very common.  Shingles is far more common in people 64 and older than in younger people. It is also more common in people whose immune systems are weakened because of a disease such as cancer or drugs such as steroids or chemotherapy.  At least 1 million people get shingles per year in the [place]. SHINGLES VACCINE  A vaccine for shingles was licensed  in 2006. In clinical trials, the vaccine reduced the risk of shingles by 50%. It can also reduce the pain in people who still get shingles after being vaccinated.  A single dose of  shingles vaccine is recommended for adults 68 years of age and older. SOME PEOPLE SHOULD NOT GET SHINGLES VACCINE OR SHOULD WAIT A person should not get shingles vaccine if he or she:  Has ever had a life-threatening allergic reaction to gelatin, the antibiotic neomycin, or any other component of shingles vaccine. Tell your caregiver if you have any severe allergies.  Has a weakened immune system because of current:  AIDS or another disease that affects the immune system.  Treatment with drugs that affect the immune system, such as prolonged use of high-dose steroids.  Cancer treatment, such as radiation or chemotherapy.  Cancer affecting the bone marrow or lymphatic system, such as leukemia or lymphoma.  Is pregnant, or might be pregnant. Women should not become pregnant until at least 4 weeks after getting shingles vaccine. Someone with a minor illness, such as a cold, may be vaccinated. Anyone with a moderate or severe acute illness should usually wait until he or she recovers before getting the vaccine. This includes anyone with a temperature of 101.3 F (38 C) or higher. WHAT ARE THE RISKS FROM SHINGLES VACCINE?  A vaccine, like any medicine, could possibly cause serious problems, such as severe allergic reactions. However, the risk of a vaccine causing serious harm, or death, is extremely small.  No serious problems have been identified with shingles vaccine. Mild Problems  Redness, soreness, swelling, or itching at the site of the injection (about 1 person in 3).  Headache (about 1 person in 6). Like all vaccines, shingles vaccine is being closely monitored for unusual or severe problems. WHAT IF THERE IS A MODERATE OR SEVERE REACTION? What should I look for? Any unusual condition, such as a severe allergic reaction or a high fever. If a severe allergic reaction occurred, it would be within a few minutes to an hour after the shot. Signs of a serious allergic reaction can  include difficulty breathing, weakness, hoarseness or wheezing, a fast heartbeat, hives, dizziness, paleness, or swelling of the throat. What should I do?  Call your caregiver, or get the person to a caregiver right away.  Tell the caregiver what happened, the date and time it happened, and when the vaccination was given.  Ask the caregiver to report the reaction by filing a Vaccine Adverse Event Reporting System (VAERS) form. Or, you can file this report through the VAERS web site at www.vaers.SamedayNews.es or by calling (346) 408-3010. VAERS does not provide medical advice. HOW CAN I LEARN MORE?  Ask your caregiver. He or she can give you the vaccine package insert or suggest other sources of information.  Contact the Centers for Disease Control and Prevention (CDC):  Call 515-468-8525 (1-800-CDC-INFO).  Visit the CDC website at http://hunter.com/ CDC Shingles Vaccine VIS (06/07/08) Document Released: 06/16/2006 Document Revised: 11/11/2011 Document Reviewed: 12/09/2012 [facility] Patient Information 2014 [place].

## 2013-11-08 NOTE — Progress Notes (Signed)
Cristina Conner August 26, 1961 [id number]   History:    53 y.o.  for annual gyn exam with no complaints today. Patient is being followed by her primary physician Dr. Willey Blade who recently did her blood work. Patient with no prior history of abnormal Pap smear. Her Tdap vaccine is up to date. She has been menopausal since 2010 which was proven by an elevated [place] but has been adamant of being on hormone replacement therapy although she is suffering at times from vasomotor symptoms. Patient with prior tubal sterilization procedure as well as resectoscopic polypectomy and endometrial ablation.  Patient had a bone density study in 2014 with left femoral neck -1.3 osteopenic range and normal Frax analysis.  Normal colonoscopy 2014  Past medical history,surgical history, family history and social history were all reviewed and documented in the EPIC chart.  Gynecologic History Patient's last menstrual period was 05/01/2009. Contraception: post menopausal status Last Pap: 2012. Results were: normal Last mammogram: 2015. Results were: normal  Obstetric History OB History  Gravida Para Term Preterm AB SAB TAB Ectopic Multiple Living  2 2        2     # Outcome Date GA Lbr Len/2nd Weight Sex Delivery Anes PTL Lv  2 PAR           1 PAR                ROS: A ROS was performed and pertinent positives and negatives are included in the history.  GENERAL: No fevers or chills. HEENT: No change in vision, no earache, sore throat or sinus congestion. NECK: No pain or stiffness. CARDIOVASCULAR: No chest pain or pressure. No palpitations. PULMONARY: No shortness of breath, cough or wheeze. GASTROINTESTINAL: No abdominal pain, nausea, vomiting or diarrhea, melena or bright red blood per rectum. GENITOURINARY: No urinary frequency, urgency, hesitancy or dysuria. MUSCULOSKELETAL: No joint or muscle pain, no back pain, no recent trauma. DERMATOLOGIC: No rash, no itching, no lesions. ENDOCRINE: No polyuria,  polydipsia, no heat or cold intolerance. No recent change in weight. HEMATOLOGICAL: No anemia or easy bruising or bleeding. NEUROLOGIC: No headache, seizures, numbness, tingling or weakness. PSYCHIATRIC: No depression, no loss of interest in normal activity or change in sleep pattern.     Exam: chaperone present  BP 130/76  Ht 5' 5.5" (1.664 m)  Wt 169 lb (76.658 kg)  BMI 27.69 kg/m2  LMP 05/01/2009  Body mass index is 27.69 kg/(m^2).  General appearance : Well developed well nourished female. No acute distress HEENT: Neck supple, trachea midline, no carotid bruits, no thyroidmegaly Lungs: Clear to auscultation, no rhonchi or wheezes, or rib retractions  Heart: Regular rate and rhythm, no murmurs or gallops Breast:Examined in sitting and supine position were symmetrical in appearance, no palpable masses or tenderness,  no skin retraction, no nipple inversion, no nipple discharge, no skin discoloration, no axillary or supraclavicular lymphadenopathy Abdomen: no palpable masses or tenderness, no rebound or guarding Extremities: no edema or skin discoloration or tenderness  Pelvic:  Bartholin, Urethra, Skene Glands: Within normal limits             Vagina: No gross lesions or discharge  Cervix: No gross lesions or discharge  Uterus  anteverted, normal size, shape and consistency, non-tender and mobile  Adnexa  Without masses or tenderness  Anus and perineum  normal   Rectovaginal  normal sphincter tone without palpated masses or tenderness             Hemoccult  PCP provides     Assessment/Plan:  53 y.o. female for annual exam who had her lab work done by her PCP a few weeks ago. Pap smear was done today in accordance to the new guidelines. Patient will be started on Brisdelle (proximal a teen) 7.5 mg 1 by mouth daily for vasomotor symptoms. Samples were provided. Risks benefits and pros and cons were discussed. Patient was also provided with a requisition to schedule to receive her  shingles vaccine. She did have shingles several years ago. She was reminded to do her monthly breast exam. We discussed importance of calcium and vitamin D and regular exercise for osteoporosis prevention. She will be due for a bone density study next year.  Note: This dictation was prepared with  Dragon/digital dictation along withSmart phrase technology. Any transcriptional errors that result from this process are unintentional.   Terrance Mass MD, 4:51 PM 11/08/2013

## 2014-07-04 ENCOUNTER — Encounter: Payer: Self-pay | Admitting: Gynecology

## 2015-05-02 ENCOUNTER — Ambulatory Visit (INDEPENDENT_AMBULATORY_CARE_PROVIDER_SITE_OTHER): Payer: BC Managed Care – PPO | Admitting: Physician Assistant

## 2015-05-02 ENCOUNTER — Ambulatory Visit (INDEPENDENT_AMBULATORY_CARE_PROVIDER_SITE_OTHER): Payer: BC Managed Care – PPO

## 2015-05-02 VITALS — BP 126/80 | HR 90 | Temp 98.0°F | Resp 17 | Ht 65.5 in | Wt 158.0 lb

## 2015-05-02 DIAGNOSIS — M545 Low back pain, unspecified: Secondary | ICD-10-CM

## 2015-05-02 DIAGNOSIS — M47816 Spondylosis without myelopathy or radiculopathy, lumbar region: Secondary | ICD-10-CM

## 2015-05-02 MED ORDER — DICLOFENAC SODIUM 75 MG PO TBEC: 75.0000 mg | DELAYED_RELEASE_TABLET | Freq: Two times a day (BID) | ORAL | Status: DC

## 2015-05-02 MED ORDER — CYCLOBENZAPRINE HCL 5 MG PO TABS: 5.0000 mg | ORAL_TABLET | Freq: Three times a day (TID) | ORAL | Status: DC | PRN

## 2015-05-02 MED ORDER — HYDROCODONE-ACETAMINOPHEN 5-325 MG PO TABS
1.0000 | ORAL_TABLET | Freq: Four times a day (QID) | ORAL | Status: DC | PRN
Start: 2015-05-02 — End: 2016-07-31

## 2015-05-02 NOTE — Progress Notes (Signed)
   Cristina Conner  MRN: [id number] DOB: 1961/07/19  Subjective:  Pt presents to clinic with low back pain for the last 4 days - started 4 days ago feeling tight and then last night while she was sleeping she felt a spasm in her lower back and today it is very uncomfortable.  She is most comfortable standing and walking around - se gets stiff sitting for long periods of time and then she has more pain with movement. She has no pain radiation into leg and no urinary complaints.  She has something similar to this in April but it was after she twisted her back.  She has had no residual back pain from that incident.  Patient Active Problem List   Diagnosis Date Noted  . Hypertension 05/20/2011  . Menopausal state 05/20/2011    Current Outpatient Prescriptions on File Prior to Visit  Medication Sig Dispense Refill  . lisinopril-hydrochlorothiazide (PRINZIDE,ZESTORETIC) 10-12.5 MG per tablet Take 1 tablet by mouth daily.      . Multiple Vitamins-Minerals (CENTRUM) tablet Take 1 tablet by mouth daily.       No current facility-administered medications on file prior to visit.    No Known Allergies  Review of Systems  Musculoskeletal: Positive for back pain. Negative for gait problem and neck pain.   Objective:  BP 126/80 mmHg  Pulse 90  Temp(Src) 98 F (36.7 C) (Oral)  Resp 17  Ht 5' 5.5" (1.664 m)  Wt 158 lb (71.668 kg)  BMI 25.88 kg/m2  SpO2 97%  LMP 05/01/2009  Physical Exam  Constitutional: She is oriented to person, place, and time and well-developed, well-nourished, and in no distress.  Pt appears uncomfortable - sitting - she is walking around the room when I enter  HENT:  Head: Normocephalic and atraumatic.  Right Ear: Hearing and external ear normal.  Left Ear: Hearing and external ear normal.  Eyes: Conjunctivae are normal.  Neck: Normal range of motion.  Pulmonary/Chest: Effort normal.  Musculoskeletal:       Lumbar back: She exhibits decreased range of motion  (decrease flexion 2nd to pain, normal lateral flexion without pain), tenderness, pain (patient has pain with extension) and spasm.  Neurological: She is alert and oriented to person, place, and time. She has normal sensation, normal strength and normal reflexes. She displays no weakness. Gait normal. Gait normal.  Skin: Skin is warm and dry.  Psychiatric: Mood, memory, affect and judgment normal.  Vitals reviewed.  [facility] reading (PRIMARY) by  Dr. Laney Pastor. ? L4 L5 bony growth vs stool  Assessment and Plan :  Bilateral low back pain without sciatica - Plan: DG Lumbar Spine 2-3 Views, cyclobenzaprine (FLEXERIL) 5 MG tablet, diclofenac (VOLTAREN) 75 MG EC tablet, HYDROcodone-acetaminophen (NORCO/VICODIN) 5-325 MG per tablet  Lumbar spondylosis, unspecified spinal osteoarthritis  Xray shows degenerative disease of the lumbar spine which would explain her flairs of muscle spasms without significant injury.  Flexeril worked well for her last time and we will use that again along with Diclofenac but patient will stop OTC advil use which using this medication.  She will continue heat and she will use Norco sparingly and only for severe pain like she is having today.  She will f/u if the pain radiates or changes/gets worse.  She understands the plan and agrees.  D/w Dr Nell Range PA-C  [facility] 05/02/2015 5:54 PM

## 2015-05-14 ENCOUNTER — Other Ambulatory Visit: Payer: Self-pay | Admitting: Physician Assistant

## 2015-05-29 ENCOUNTER — Other Ambulatory Visit: Payer: Self-pay | Admitting: Physician Assistant

## 2015-05-30 NOTE — Telephone Encounter (Signed)
done

## 2015-05-30 NOTE — Telephone Encounter (Signed)
Sarah, do you want to RF this or need to re-check pt? If OK to RF, refill for flexeril was denied 9/11, do you want to [facility] RF for that as well? It looks like the muscle relaxant was a major part of your treatment plan.

## 2015-06-26 ENCOUNTER — Other Ambulatory Visit: Payer: Self-pay | Admitting: Physician Assistant

## 2015-06-29 NOTE — Telephone Encounter (Signed)
Judson Roch, do you want to give RFs or RTC?

## 2016-07-31 ENCOUNTER — Encounter: Payer: Self-pay | Admitting: Gynecology

## 2016-07-31 ENCOUNTER — Ambulatory Visit (INDEPENDENT_AMBULATORY_CARE_PROVIDER_SITE_OTHER): Payer: BC Managed Care – PPO | Admitting: Gynecology

## 2016-07-31 VITALS — BP 136/74 | Ht 66.0 in | Wt 135.0 lb

## 2016-07-31 DIAGNOSIS — Z79890 Hormone replacement therapy (postmenopausal): Secondary | ICD-10-CM | POA: Insufficient documentation

## 2016-07-31 DIAGNOSIS — Z01419 Encounter for gynecological examination (general) (routine) without abnormal findings: Secondary | ICD-10-CM

## 2016-07-31 DIAGNOSIS — N898 Other specified noninflammatory disorders of vagina: Secondary | ICD-10-CM

## 2016-07-31 DIAGNOSIS — M8580 Other specified disorders of bone density and structure, unspecified site: Secondary | ICD-10-CM | POA: Diagnosis not present

## 2016-07-31 LAB — WET PREP FOR TRICH, YEAST, CLUE
CLUE CELLS WET PREP: NONE SEEN
Trich, Wet Prep: NONE SEEN
YEAST WET PREP: NONE SEEN

## 2016-07-31 MED ORDER — METRONIDAZOLE 0.75 % VA GEL: 1.0000 | Freq: Two times a day (BID) | VAGINAL | 0 refills | Status: DC

## 2016-07-31 MED ORDER — ESTRADIOL 10 MCG VA TABS: 1.0000 | ORAL_TABLET | VAGINAL | 11 refills | Status: DC

## 2016-07-31 NOTE — Progress Notes (Signed)
Cristina Conner [birth year] [id number]   History:    55 y.o.  for annual gyn exam with a complaint of a slight vaginal discharge but prior to that she has had issues with vaginal dryness with having intercourse with her husband. Her husband has been diagnosed with leukemia she's been under a lot of stress and for this reason her primary physician has placed her onset next to take on a when necessary basis. Her PCP recently did her blood work. Patient with no previous history of any abnormal Pap smear. Her Tdap vaccine is up-to-date. Patient been menopausal since 2010 as demonstrated by elevated Alicia but she has been adamant on starting any hormone replacement therapy. She reports no vasomotor symptoms. Patient has had prior history resectoscopic polypectomy as well as endometrial ablation and tubal sterilization. She had a normal colonoscopy in 2010.  Bone density 2014 the lowest T score was at the left femoral neck with a value of -1.3 normal Frax analysis. She is taking calcium and vitamin D. She has been exercising on a regular basis and was weighing 169 pounds in 2015 she's down to 135 pounds now with ideal BMI.  Past medical history,surgical history, family history and social history were all reviewed and documented in the EPIC chart.  Gynecologic History Patient's last menstrual period was 05/01/2009. Contraception: post menopausal status and tubal ligation Last Pap: 2015. Results were: normal Last mammogram: 2015. Results were: normal  Obstetric History OB History  Gravida Para Term Preterm AB Living  2 2       2   SAB TAB Ectopic Multiple Live Births               # Outcome Date GA Lbr Len/2nd Weight Sex Delivery Anes PTL Lv  2 Para           1 Para                ROS: A ROS was performed and pertinent positives and negatives are included in the history.  GENERAL: No fevers or chills. HEENT: No change in vision, no earache, sore throat or sinus congestion. NECK: No pain or  stiffness. CARDIOVASCULAR: No chest pain or pressure. No palpitations. PULMONARY: No shortness of breath, cough or wheeze. GASTROINTESTINAL: No abdominal pain, nausea, vomiting or diarrhea, melena or bright red blood per rectum. GENITOURINARY: No urinary frequency, urgency, hesitancy or dysuria. MUSCULOSKELETAL: No joint or muscle pain, no back pain, no recent trauma. DERMATOLOGIC: No rash, no itching, no lesions. ENDOCRINE: No polyuria, polydipsia, no heat or cold intolerance. No recent change in weight. HEMATOLOGICAL: No anemia or easy bruising or bleeding. NEUROLOGIC: No headache, seizures, numbness, tingling or weakness. PSYCHIATRIC: No depression, no loss of interest in normal activity or change in sleep pattern.     Exam: chaperone present  BP 136/74   Ht 5\' 6"  (1.676 m)   Wt 135 lb (61.2 kg)   LMP 05/01/2009   BMI 21.79 kg/m   Body mass index is 21.79 kg/m.  General appearance : Well developed well nourished female. No acute distress HEENT: Eyes: no retinal hemorrhage or exudates,  Neck supple, trachea midline, no carotid bruits, no thyroidmegaly Lungs: Clear to auscultation, no rhonchi or wheezes, or rib retractions  Heart: Regular rate and rhythm, no murmurs or gallops Breast:Examined in sitting and supine position were symmetrical in appearance, no palpable masses or tenderness,  no skin retraction, no nipple inversion, no nipple discharge, no skin discoloration, no axillary or supraclavicular lymphadenopathy Abdomen:  no palpable masses or tenderness, no rebound or guarding Extremities: no edema or skin discoloration or tenderness  Pelvic:  Bartholin, Urethra, Skene Glands: Within normal limits             Vagina: No gross lesions or discharge, mild atrophic changes  Cervix: No gross lesions or discharge  Uterus  anteverted, normal size, shape and consistency, non-tender and mobile  Adnexa  Without masses or tenderness  Anus and perineum  normal   Rectovaginal  normal  sphincter tone without palpated masses or tenderness             Hemoccult cards provided  Wet prep tumors to count white blood cell, moderate bacteria   Assessment/Plan:  55 y.o. female for annual exam because of possible bacterial vaginosis based on findings from wet prep she will be prescribed MetroGel to apply intravaginally daily at bedtime for 5-7 days. She will wait for one week after finishing treatment in then begin Vagifem 10 g intravaginally twice a week for vaginal atrophy. Risk benefits and pros and cons were discussed. Patient scheduled for mammogram today. Patient will return back to the office before the end of the year for her bone density study and she will bring with her the fecal Hemoccult cards for testing.   Terrance Mass MD, 8:42 AM 07/31/2016

## 2016-07-31 NOTE — Patient Instructions (Addendum)
Bacterial Vaginosis Bacterial vaginosis is a vaginal infection that occurs when the normal balance of bacteria in the vagina is disrupted. It results from an overgrowth of certain bacteria. This is the most common vaginal infection among women ages 82-44. Because bacterial vaginosis increases your risk for STIs (sexually transmitted infections), getting treated can help reduce your risk for chlamydia, gonorrhea, herpes, and HIV (human immunodeficiency virus). Treatment is also important for preventing complications in pregnant women, because this condition can cause an early (premature) delivery. What are the causes? This condition is caused by an increase in harmful bacteria that are normally present in small amounts in the vagina. However, the reason that the condition develops is not fully understood. What increases the risk? The following factors may make you more likely to develop this condition:  Having a new sexual partner or multiple sexual partners.  Having unprotected sex.  Douching.  Having an intrauterine device (IUD).  Smoking.  Drug and alcohol abuse.  Taking certain antibiotic medicines.  Being pregnant. You cannot get bacterial vaginosis from toilet seats, bedding, swimming pools, or contact with objects around you. What are the signs or symptoms? Symptoms of this condition include:  Grey or white vaginal discharge. The discharge can also be watery or foamy.  A fish-like odor with discharge, especially after sexual intercourse or during menstruation.  Itching in and around the vagina.  Burning or pain with urination. Some women with bacterial vaginosis have no signs or symptoms. How is this diagnosed? This condition is diagnosed based on:  Your medical history.  A physical exam of the vagina.  Testing a sample of vaginal fluid under a microscope to look for a large amount of bad bacteria or abnormal cells. Your health care provider may use a cotton swab or a  small wooden spatula to collect the sample. How is this treated? This condition is treated with antibiotics. These may be given as a pill, a vaginal cream, or a medicine that is put into the vagina (suppository). If the condition comes back after treatment, a second round of antibiotics may be needed. Follow these instructions at home: Medicines  Take over-the-counter and prescription medicines only as told by your health care provider.  Take or use your antibiotic as told by your health care provider. Do not stop taking or using the antibiotic even if you start to feel better. General instructions  If you have a female sexual partner, tell her that you have a vaginal infection. She should see her health care provider and be treated if she has symptoms. If you have a female sexual partner, he does not need treatment.  During treatment:  Avoid sexual activity until you finish treatment.  Do not douche.  Avoid alcohol as directed by your health care provider.  Avoid breastfeeding as directed by your health care provider.  Drink enough water and fluids to keep your urine clear or pale yellow.  Keep the area around your vagina and rectum clean.  Wash the area daily with warm water.  Wipe yourself from front to back after using the toilet.  Keep all follow-up visits as told by your health care provider. This is important. How is this prevented?  Do not douche.  Wash the outside of your vagina with warm water only.  Use protection when having sex. This includes latex condoms and dental dams.  Limit how many sexual partners you have. To help prevent bacterial vaginosis, it is best to have sex with just one partner (monogamous).  Make sure you and your sexual partner are tested for STIs.  Wear cotton or cotton-lined underwear.  Avoid wearing tight pants and pantyhose, especially during summer.  Limit the amount of alcohol that you drink.  Do not use any products that contain  nicotine or tobacco, such as cigarettes and e-cigarettes. If you need help quitting, ask your health care provider.  Do not use illegal drugs. Where to find more information:  Centers for Disease Control and Prevention: AppraiserFraud.fi  American Sexual Health Association (ASHA): www.ashastd.org  [place]. Department of Health and Financial controller, Office on Women's Health: DustingSprays.pl or SecuritiesCard.it Contact a health care provider if:  Your symptoms do not improve, even after treatment.  You have more discharge or pain when urinating.  You have a fever.  You have pain in your abdomen.  You have pain during sex.  You have vaginal bleeding between periods. Summary  Bacterial vaginosis is a vaginal infection that occurs when the normal balance of bacteria in the vagina is disrupted.  Because bacterial vaginosis increases your risk for STIs (sexually transmitted infections), getting treated can help reduce your risk for chlamydia, gonorrhea, herpes, and HIV (human immunodeficiency virus). Treatment is also important for preventing complications in pregnant women, because the condition can cause an early (premature) delivery.  This condition is treated with antibiotic medicines. These may be given as a pill, a vaginal cream, or a medicine that is put into the vagina (suppository). This information is not intended to replace advice given to you by your health care provider. Make sure you discuss any questions you have with your health care provider. Document Released: 08/19/2005 Document Revised: 05/04/2016 Document Reviewed: 05/04/2016 Elsevier Interactive Patient Education  2017 [place].  Bone Densitometry Introduction Bone densitometry is an imaging test that uses a special X-ray to measure the amount of calcium and other minerals in your bones (bone density). This test is also known as a bone mineral density test or dual-energy  X-ray absorptiometry (DXA). The test can measure bone density at your hip and your spine. It is similar to having a regular X-ray. You may have this test to:  Diagnose a condition that causes weak or thin bones (osteoporosis).  Predict your risk of a broken bone (fracture).  Determine how well osteoporosis treatment is working. Tell a health care provider about:  Any allergies you have.  All medicines you are taking, including vitamins, herbs, eye drops, creams, and over-the-counter medicines.  Any problems you or family members have had with anesthetic medicines.  Any blood disorders you have.  Any surgeries you have had.  Any medical conditions you have.  Possibility of pregnancy.  Any other medical test you had within the previous 14 days that used contrast material. What are the risks? Generally, this is a safe procedure. However, problems can occur and may include the following:  This test exposes you to a very small amount of radiation.  The risks of radiation exposure may be greater to unborn children. What happens before the procedure?  Do not take any calcium supplements for 24 hours before having the test. You can otherwise eat and drink what you usually do.  Take off all metal jewelry, eyeglasses, dental appliances, and any other metal objects. What happens during the procedure?  You may lie on an exam table. There will be an X-ray generator below you and an imaging device above you.  Other devices, such as boxes or braces, may be used to position your body properly  for the scan.  You will need to lie still while the machine slowly scans your body.  The images will show up on a computer monitor. What happens after the procedure? You may need more testing at a later time. This information is not intended to replace advice given to you by your health care provider. Make sure you discuss any questions you have with your health care provider. Document Released:  09/10/2004 Document Revised: 01/25/2016 Document Reviewed: 01/27/2014  2017 Elsevier

## 2016-08-01 ENCOUNTER — Other Ambulatory Visit: Payer: Self-pay | Admitting: Gynecology

## 2016-08-01 DIAGNOSIS — Z13820 Encounter for screening for osteoporosis: Secondary | ICD-10-CM

## 2016-08-06 ENCOUNTER — Encounter: Payer: Self-pay | Admitting: Gynecology

## 2016-08-06 ENCOUNTER — Ambulatory Visit (INDEPENDENT_AMBULATORY_CARE_PROVIDER_SITE_OTHER): Payer: BC Managed Care – PPO

## 2016-08-06 ENCOUNTER — Other Ambulatory Visit: Payer: Self-pay | Admitting: Gynecology

## 2016-08-06 DIAGNOSIS — M899 Disorder of bone, unspecified: Secondary | ICD-10-CM

## 2016-08-06 DIAGNOSIS — Z13820 Encounter for screening for osteoporosis: Secondary | ICD-10-CM | POA: Diagnosis not present

## 2016-08-07 ENCOUNTER — Other Ambulatory Visit: Payer: Self-pay | Admitting: Anesthesiology

## 2016-08-07 DIAGNOSIS — Z1211 Encounter for screening for malignant neoplasm of colon: Secondary | ICD-10-CM

## 2016-08-09 ENCOUNTER — Other Ambulatory Visit: Payer: Self-pay | Admitting: *Deleted

## 2016-08-09 DIAGNOSIS — M8580 Other specified disorders of bone density and structure, unspecified site: Secondary | ICD-10-CM

## 2017-01-15 ENCOUNTER — Encounter: Payer: Self-pay | Admitting: Gynecology

## 2017-08-01 ENCOUNTER — Encounter: Payer: Self-pay | Admitting: Obstetrics & Gynecology

## 2017-08-01 ENCOUNTER — Ambulatory Visit (INDEPENDENT_AMBULATORY_CARE_PROVIDER_SITE_OTHER): Payer: BC Managed Care – PPO | Admitting: Obstetrics & Gynecology

## 2017-08-01 VITALS — BP 126/84 | Ht 66.0 in | Wt 145.0 lb

## 2017-08-01 DIAGNOSIS — Z01419 Encounter for gynecological examination (general) (routine) without abnormal findings: Secondary | ICD-10-CM | POA: Diagnosis not present

## 2017-08-01 DIAGNOSIS — Z780 Asymptomatic menopausal state: Secondary | ICD-10-CM | POA: Diagnosis not present

## 2017-08-01 DIAGNOSIS — Z1151 Encounter for screening for human papillomavirus (HPV): Secondary | ICD-10-CM

## 2017-08-01 DIAGNOSIS — M8589 Other specified disorders of bone density and structure, multiple sites: Secondary | ICD-10-CM | POA: Diagnosis not present

## 2017-08-01 NOTE — Progress Notes (Signed)
Cristina Conner Oct 17, 1960 [id number]   History:    56 y.o.  G2P2 Married.  Children doing well.  RP:  Established patient presenting for annual gyn exam   HPI: Menopause well-tolerated without hormone replacement therapy.  Very mild hot flushes.  No postmenopausal bleeding.  Not sexually active very often, but doing well with lubricants.  No pelvic pain.  Breasts normal.  Urine and bowel movements normal.  Very active physically, does Zumba, aerobics and Pure bar.  Body mass index 23.40.  Past medical history,surgical history, family history and social history were all reviewed and documented in the EPIC chart.  Gynecologic History Patient's last menstrual period was 05/01/2009. Contraception: post menopausal status and tubal ligation Last Pap: 2015. Results were: normal Last mammogram: 07/2016. Results were: normal, next Mammo scheduled next week. Colono 2014 Dexa 08/2016 Osteopenia  Obstetric History OB History  Gravida Para Term Preterm AB Living  2 2       2   SAB TAB Ectopic Multiple Live Births               # Outcome Date GA Lbr Len/2nd Weight Sex Delivery Anes PTL Lv  2 Para           1 Para                ROS: A ROS was performed and pertinent positives and negatives are included in the history.  GENERAL: No fevers or chills. HEENT: No change in vision, no earache, sore throat or sinus congestion. NECK: No pain or stiffness. CARDIOVASCULAR: No chest pain or pressure. No palpitations. PULMONARY: No shortness of breath, cough or wheeze. GASTROINTESTINAL: No abdominal pain, nausea, vomiting or diarrhea, melena or bright red blood per rectum. GENITOURINARY: No urinary frequency, urgency, hesitancy or dysuria. MUSCULOSKELETAL: No joint or muscle pain, no back pain, no recent trauma. DERMATOLOGIC: No rash, no itching, no lesions. ENDOCRINE: No polyuria, polydipsia, no heat or cold intolerance. No recent change in weight. HEMATOLOGICAL: No anemia or easy bruising or bleeding.  NEUROLOGIC: No headache, seizures, numbness, tingling or weakness. PSYCHIATRIC: No depression, no loss of interest in normal activity or change in sleep pattern.     Exam:   BP 126/84   Ht 5\' 6"  (1.676 m)   Wt 145 lb (65.8 kg)   LMP 05/01/2009   BMI 23.40 kg/m   Body mass index is 23.4 kg/m.  General appearance : Well developed well nourished female. No acute distress HEENT: Eyes: no retinal hemorrhage or exudates,  Neck supple, trachea midline, no carotid bruits, no thyroidmegaly Lungs: Clear to auscultation, no rhonchi or wheezes, or rib retractions  Heart: Regular rate and rhythm, no murmurs or gallops Breast:Examined in sitting and supine position were symmetrical in appearance, no palpable masses or tenderness,  no skin retraction, no nipple inversion, no nipple discharge, no skin discoloration, no axillary or supraclavicular lymphadenopathy Abdomen: no palpable masses or tenderness, no rebound or guarding Extremities: no edema or skin discoloration or tenderness  Pelvic: Vulva normal  Bartholin, Urethra, Skene Glands: Within normal limits             Vagina: No gross lesions or discharge  Cervix: No gross lesions or discharge.  Pap/HPV HR done.  Uterus  AV, normal size, shape and consistency, non-tender and mobile  Adnexa  Without masses or tenderness  Anus and perineum  normal    Assessment/Plan:  56 y.o. female for annual exam   1. Encounter for routine gynecological examination with  Papanicolaou smear of cervix Gynecologic exam normal.  Pap with HPV high risk done.  Breast exam normal.  Screening mammogram scheduled for next week.  Will probably have next annual exam with family physician at [facility].  Will do health labs there.  Colonoscopy 2014.  2. Menopause present Menopause well-tolerated without hormone replacement therapy.  No postmenopausal bleeding.  Recommend Astroglide or coconut oil as lubricants for intercourse as needed.  3. Osteopenia of multiple  sites Osteopenia on last bone density December 2017.  Will start vitamin D supplements.  Continue with calcium supplement maximum 1 tablet a day and the rest in her nutrition.  Continue with weightbearing physical activity.  Will repeat bone density December 2019.  Princess Bruins MD, 8:59 AM 08/01/2017

## 2017-08-01 NOTE — Patient Instructions (Signed)
1. Encounter for routine gynecological examination with Papanicolaou smear of cervix Gynecologic exam normal.  Pap with HPV high risk done.  Breast exam normal.  Screening mammogram scheduled for next week.  Will probably have next annual exam with family physician at [facility].  Will do health labs there.  Colonoscopy 2014.  2. Menopause present Menopause well-tolerated without hormone replacement therapy.  No postmenopausal bleeding.  Recommend Astroglide or coconut oil as lubricants for intercourse as needed.  3. Osteopenia of multiple sites Osteopenia on last bone density December 2017.  Will start vitamin D supplements.  Continue with calcium supplement maximum 1 tablet a day and the rest in her nutrition.  Continue with weightbearing physical activity.  Will repeat bone density December 2019.  Cristina Conner, it was a pleasure meeting you today!  I will inform you of your results as soon as available.   Health Maintenance for Postmenopausal Women Menopause is a normal process in which your reproductive ability comes to an end. This process happens gradually over a span of months to years, usually between the ages of 59 and 77. Menopause is complete when you have missed 12 consecutive menstrual periods. It is important to talk with your health care provider about some of the most common conditions that affect postmenopausal women, such as heart disease, cancer, and bone loss (osteoporosis). Adopting a healthy lifestyle and getting preventive care can help to promote your health and wellness. Those actions can also lower your chances of developing some of these common conditions. What should I know about menopause? During menopause, you may experience a number of symptoms, such as:  Moderate-to-severe hot flashes.  Night sweats.  Decrease in sex drive.  Mood swings.  Headaches.  Tiredness.  Irritability.  Memory problems.  Insomnia.  Choosing to treat or not to treat menopausal changes  is an individual decision that you make with your health care provider. What should I know about hormone replacement therapy and supplements? Hormone therapy products are effective for treating symptoms that are associated with menopause, such as hot flashes and night sweats. Hormone replacement carries certain risks, especially as you become older. If you are thinking about using estrogen or estrogen with progestin treatments, discuss the benefits and risks with your health care provider. What should I know about heart disease and stroke? Heart disease, heart attack, and stroke become more likely as you age. This may be due, in part, to the hormonal changes that your body experiences during menopause. These can affect how your body processes dietary fats, triglycerides, and cholesterol. Heart attack and stroke are both medical emergencies. There are many things that you can do to help prevent heart disease and stroke:  Have your blood pressure checked at least every 1-2 years. High blood pressure causes heart disease and increases the risk of stroke.  If you are 55-60 years old, ask your health care provider if you should take aspirin to prevent a heart attack or a stroke.  Do not use any tobacco products, including cigarettes, chewing tobacco, or electronic cigarettes. If you need help quitting, ask your health care provider.  It is important to eat a healthy diet and maintain a healthy weight. ? Be sure to include plenty of vegetables, fruits, low-fat dairy products, and lean protein. ? Avoid eating foods that are high in solid fats, added sugars, or salt (sodium).  Get regular exercise. This is one of the most important things that you can do for your health. ? Try to exercise for at  least 150 minutes each week. The type of exercise that you do should increase your heart rate and make you sweat. This is known as moderate-intensity exercise. ? Try to do strengthening exercises at least twice  each week. Do these in addition to the moderate-intensity exercise.  Know your numbers.Ask your health care provider to check your cholesterol and your blood glucose. Continue to have your blood tested as directed by your health care provider.  What should I know about cancer screening? There are several types of cancer. Take the following steps to reduce your risk and to catch any cancer development as early as possible. Breast Cancer  Practice breast self-awareness. ? This means understanding how your breasts normally appear and feel. ? It also means doing regular breast self-exams. Let your health care provider know about any changes, no matter how small.  If you are 51 or older, have a clinician do a breast exam (clinical breast exam or CBE) every year. Depending on your age, family history, and medical history, it may be recommended that you also have a yearly breast X-ray (mammogram).  If you have a family history of breast cancer, talk with your health care provider about genetic screening.  If you are at high risk for breast cancer, talk with your health care provider about having an MRI and a mammogram every year.  Breast cancer (BRCA) gene test is recommended for women who have family members with BRCA-related cancers. Results of the assessment will determine the need for genetic counseling and BRCA1 and for BRCA2 testing. BRCA-related cancers include these types: ? Breast. This occurs in males or females. ? Ovarian. ? Tubal. This may also be called fallopian tube cancer. ? Cancer of the abdominal or pelvic lining (peritoneal cancer). ? Prostate. ? Pancreatic.  Cervical, Uterine, and Ovarian Cancer Your health care provider may recommend that you be screened regularly for cancer of the pelvic organs. These include your ovaries, uterus, and vagina. This screening involves a pelvic exam, which includes checking for microscopic changes to the surface of your cervix (Pap  test).  For women ages 21-65, health care providers may recommend a pelvic exam and a Pap test every three years. For women ages 29-65, they may recommend the Pap test and pelvic exam, combined with testing for human papilloma virus (HPV), every five years. Some types of HPV increase your risk of cervical cancer. Testing for HPV may also be done on women of any age who have unclear Pap test results.  Other health care providers may not recommend any screening for nonpregnant women who are considered low risk for pelvic cancer and have no symptoms. Ask your health care provider if a screening pelvic exam is right for you.  If you have had past treatment for cervical cancer or a condition that could lead to cancer, you need Pap tests and screening for cancer for at least 20 years after your treatment. If Pap tests have been discontinued for you, your risk factors (such as having a new sexual partner) need to be reassessed to determine if you should start having screenings again. Some women have medical problems that increase the chance of getting cervical cancer. In these cases, your health care provider may recommend that you have screening and Pap tests more often.  If you have a family history of uterine cancer or ovarian cancer, talk with your health care provider about genetic screening.  If you have vaginal bleeding after reaching menopause, tell your health care provider.  There are currently no reliable tests available to screen for ovarian cancer.  Lung Cancer Lung cancer screening is recommended for adults 59-28 years old who are at high risk for lung cancer because of a history of smoking. A yearly low-dose CT scan of the lungs is recommended if you:  Currently smoke.  Have a history of at least 30 pack-years of smoking and you currently smoke or have quit within the past 15 years. A pack-year is smoking an average of one pack of cigarettes per day for one year.  Yearly screening  should:  Continue until it has been 15 years since you quit.  Stop if you develop a health problem that would prevent you from having lung cancer treatment.  Colorectal Cancer  This type of cancer can be detected and can often be prevented.  Routine colorectal cancer screening usually begins at age 19 and continues through age 54.  If you have risk factors for colon cancer, your health care provider may recommend that you be screened at an earlier age.  If you have a family history of colorectal cancer, talk with your health care provider about genetic screening.  Your health care provider may also recommend using home test kits to check for hidden blood in your stool.  A small camera at the end of a tube can be used to examine your colon directly (sigmoidoscopy or colonoscopy). This is done to check for the earliest forms of colorectal cancer.  Direct examination of the colon should be repeated every 5-10 years until age 109. However, if early forms of precancerous polyps or small growths are found or if you have a family history or genetic risk for colorectal cancer, you may need to be screened more often.  Skin Cancer  Check your skin from head to toe regularly.  Monitor any moles. Be sure to tell your health care provider: ? About any new moles or changes in moles, especially if there is a change in a mole's shape or color. ? If you have a mole that is larger than the size of a pencil eraser.  If any of your family members has a history of skin cancer, especially at a young age, talk with your health care provider about genetic screening.  Always use sunscreen. Apply sunscreen liberally and repeatedly throughout the day.  Whenever you are outside, protect yourself by wearing long sleeves, pants, a wide-brimmed hat, and sunglasses.  What should I know about osteoporosis? Osteoporosis is a condition in which bone destruction happens more quickly than new bone creation. After  menopause, you may be at an increased risk for osteoporosis. To help prevent osteoporosis or the bone fractures that can happen because of osteoporosis, the following is recommended:  If you are 64-86 years old, get at least 1,000 mg of calcium and at least 600 mg of vitamin D per day.  If you are older than age 19 but younger than age 26, get at least 1,200 mg of calcium and at least 600 mg of vitamin D per day.  If you are older than age 32, get at least 1,200 mg of calcium and at least 800 mg of vitamin D per day.  Smoking and excessive alcohol intake increase the risk of osteoporosis. Eat foods that are rich in calcium and vitamin D, and do weight-bearing exercises several times each week as directed by your health care provider. What should I know about how menopause affects my mental health? Depression may occur at any  age, but it is more common as you become older. Common symptoms of depression include:  Low or sad mood.  Changes in sleep patterns.  Changes in appetite or eating patterns.  Feeling an overall lack of motivation or enjoyment of activities that you previously enjoyed.  Frequent crying spells.  Talk with your health care provider if you think that you are experiencing depression. What should I know about immunizations? It is important that you get and maintain your immunizations. These include:  Tetanus, diphtheria, and pertussis (Tdap) booster vaccine.  Influenza every year before the flu season begins.  Pneumonia vaccine.  Shingles vaccine.  Your health care provider may also recommend other immunizations. This information is not intended to replace advice given to you by your health care provider. Make sure you discuss any questions you have with your health care provider. Document Released: 10/11/2005 Document Revised: 03/08/2016 Document Reviewed: 05/23/2015 Elsevier Interactive Patient Education  2018 [employer].

## 2017-08-01 NOTE — Addendum Note (Signed)
Addended by: Thurnell Garbe A on: 08/01/2017 10:10 AM   Modules accepted: Orders

## 2017-08-05 LAB — PAP, TP IMAGING W/ HPV RNA, RFLX HPV TYPE 16,18/45: HPV DNA High Risk: NOT DETECTED

## 2018-01-02 ENCOUNTER — Ambulatory Visit (INDEPENDENT_AMBULATORY_CARE_PROVIDER_SITE_OTHER): Payer: BC Managed Care – PPO | Admitting: Family Medicine

## 2018-01-02 ENCOUNTER — Encounter: Payer: Self-pay | Admitting: Family Medicine

## 2018-01-02 VITALS — BP 117/76 | HR 94 | Ht 66.0 in | Wt 148.9 lb

## 2018-01-02 DIAGNOSIS — M25552 Pain in left hip: Secondary | ICD-10-CM | POA: Insufficient documentation

## 2018-01-02 DIAGNOSIS — I10 Essential (primary) hypertension: Secondary | ICD-10-CM | POA: Diagnosis not present

## 2018-01-02 DIAGNOSIS — M8589 Other specified disorders of bone density and structure, multiple sites: Secondary | ICD-10-CM | POA: Diagnosis not present

## 2018-01-02 DIAGNOSIS — M1612 Unilateral primary osteoarthritis, left hip: Secondary | ICD-10-CM | POA: Diagnosis not present

## 2018-01-02 DIAGNOSIS — Z780 Asymptomatic menopausal state: Secondary | ICD-10-CM | POA: Diagnosis not present

## 2018-01-02 DIAGNOSIS — Z636 Dependent relative needing care at home: Secondary | ICD-10-CM | POA: Insufficient documentation

## 2018-01-02 MED ORDER — MELOXICAM 15 MG PO TABS: ORAL_TABLET | ORAL | 1 refills | Status: DC

## 2018-01-02 NOTE — Progress Notes (Signed)
New patient office visit note:  Impression and Recommendations:    1. Essential hypertension   2. Osteopenia of multiple sites   3. Postmenopausal state- went thru early 23's   4. Caregiver burden- for husband with ALLeukemia   5. Primary osteoarthritis of left hip   6. Left hip pain     - HTN - Controlled with diet, exercise and medication.  - Continue taking medication as directed.   -Osteopenia of multiple sites - Continue taking Calcium/Vitamin D supplement. - Encouraged weightlifting at least two days weekly.   - Postmenopausal state - Will continue to follow up with GYN for hormonal replacement therapy if necessary.   - Caregiver burden - Continue to exercise to take care of herself and to help cope with stresses of caring for sick husband.   - Primary osteoarthritis of left hip/left hip pain - Recommended taking OTC Tylenol for pain. - Will prescribe Mobic to take instead of Aleve.  - Discontinue taking glucosamine chondroitin since it is not helpful. - Recommended Tumeric.    - Recommended icing the joint.  - Will give referral to sports medicine specialist as patient is amenable to injection.     Education and routine counseling performed. Handouts provided.  Orders Placed This Encounter  Procedures  . Ambulatory referral to Sports Medicine    Meds ordered this encounter  Medications  . meloxicam (MOBIC) 15 MG tablet    Sig: 0.5-1 tab po qd prn pain    Dispense:  30 tablet    Refill:  1    Gross side effects, risk and benefits, and alternatives of medications discussed with patient.  Patient is aware that all medications have potential side effects and we are unable to predict every side effect or drug-drug interaction that may occur.  Expresses verbal understanding and consents to current therapy plan and treatment regimen.  Return for CPE/ yrly physical, come fasting near future at your convenience.  Please see AVS handed out to patient at  the end of our visit for further patient instructions/ counseling done pertaining to today's office visit.    Note: This document was prepared using Dragon voice recognition software and may include unintentional dictation errors.   This document serves as a record of services personally performed by Mellody Dance, DO. It was created on her behalf by Bea Graff, a trained medical scribe. The creation of this record is based on the scribe's personal observations and the provider's statements to them.   I have reviewed the above medical documentation for accuracy and completeness and I concur.  Mellody Dance 01/06/18 8:52 AM   -------------------------------------------------------------------------------------   Subjective:    Chief complaint:   Chief Complaint  Patient presents with  . Establish Care     HPI: Cristina Conner is a pleasant 57 y.o. female who presents to [place] at [facility] today to review their medical history with me and establish care.   I asked the patient to review their chronic problem list with me to ensure everything was updated and accurate.    All recent office visits with other providers, any medical records that patient brought in etc  - I reviewed today.     We asked pt to get [place] their medical records from [facility] providers/ specialists that they had seen within the past 3-5 years- if they are in private practice and/or do not work for [employer], [facility], [place], [place] or [place] owned  practice.  Told them to call their specialists to clarify this if they are not sure.   Past Medical History HTN - Controlled on Lisinopril/HCTZ 20/12.5 mg and exercise for the past 4-5 years.  Osteopenia - Takes Calcium/Vit D supplement after having a bone density test last year.    Past Family History Father had arthrosclerotic blockages and PVD due to smoking onset in his 7s. Father is deceased.  Mother had CABG onset in her 15s.  Mother  diagnosed with DM in mid 81s.  Has one brother that is overweight.  Denies family h/o cancers, SI, depression or anxiety.    Past Surgical History  Past Surgical History:  Procedure Laterality Date  . CESAREAN SECTION     x 2  . ENDOMETRIAL ABLATION    . PELVIC LAPAROSCOPY  1999   left salpingectomy, excision of Right, paratubal cyst  . PELVIC LAPAROSCOPY     laparoscopy with lysis of pelvic adhesions  . SHOULDER SURGERY  11/2010   "FROZEN SHOULDER"  . TUBAL LIGATION        Social History She has never smoked and does not use ETOH.    Other Providers [facility] was her PCP. Sees them yearly for CPE, last done February 2018.  OB/GYN- Dr. Dellis Filbert    She states she is looking for a new provider that she can get in contact with more easily and is located in closer proximity to her house. 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 She is originally from [place] in [place]. She has two children and no grandchildren. She has been happily married for the past 31 years. Her husband has leukemia and she is currently the primary caregiver. This is the second time he has had this. She exercises frequently doing Zumba, Strong by Zumba, walking and other cardio related activities. She does not currently do weightlifting.   She states she has been having issues with left hip pain that radiates down the LLE which began gradually last year. She states the pain has worsened in the past 6 months. Bearing weight increases the pain and it is worse in the mornings when she gets out of the bed. Sometimes exercising intensifies the pain as well. Hot showers and taking Aleve help to alleviate the pain. She has taken glucosamine chondroitin in the past with no significant relief.    Wt Readings from Last 3 Encounters:  01/02/18 148 lb 14.4 oz (67.5 kg)  08/01/17 145 lb (65.8 kg)  07/31/16 135 lb (61.2 kg)   BP Readings from Last 3 Encounters:  01/02/18  117/76  08/01/17 126/84  07/31/16 136/74   Pulse Readings from Last 3 Encounters:  01/02/18 94  05/02/15 90  10/13/12 74   BMI Readings from Last 3 Encounters:  01/02/18 24.03 kg/m  08/01/17 23.40 kg/m  07/31/16 21.79 kg/m    Patient Care Team    Relationship Specialty Notifications Start End  Mellody Dance, DO PCP - General Family Medicine  01/02/18   Druscilla Brownie, [facility]  01/02/18   Terrance Mass, MD  Gynecology  01/02/18   Julieanne Cotton  Ophthalmology  01/02/18     Patient Active Problem List   Diagnosis Date Noted  . Hypertension 05/20/2011    Priority: High  . Osteopenia 07/31/2016    Priority: Medium  . Postmenopausal state- went thru early 40's 01/02/2018    Priority: Low  . Primary osteoarthritis of left hip 01/02/2018  .  Caregiver burden- for husband with ALLeukemia 01/02/2018  . Left hip pain 01/02/2018  . Hormone replacement therapy (HRT) 07/31/2016  . Menopausal state 05/20/2011     Past Medical History:  Diagnosis Date  . Hypertension   . Osteopenia   . Post-operative nausea and vomiting    vomiting after general anesthesia per pt.     Past Medical History:  Diagnosis Date  . Hypertension   . Osteopenia   . Post-operative nausea and vomiting    vomiting after general anesthesia per pt.     Past Surgical History:  Procedure Laterality Date  . CESAREAN SECTION     x 2  . ENDOMETRIAL ABLATION    . PELVIC LAPAROSCOPY  1999   left salpingectomy, excision of Right, paratubal cyst  . PELVIC LAPAROSCOPY     laparoscopy with lysis of pelvic adhesions  . SHOULDER SURGERY  11/2010   "FROZEN SHOULDER"  . TUBAL LIGATION       Family History  Problem Relation Age of Onset  . Hypertension Mother   . Heart disease Mother   . Diabetes Mother   . Hypertension Father   . Colon cancer Neg Hx      Social History   Substance and Sexual Activity  Drug Use No     Social History   Substance and  Sexual Activity  Alcohol Use Yes   Comment: rare     Social History   Tobacco Use  Smoking Status Never Smoker  Smokeless Tobacco Never Used     Current Meds  Medication Sig  . Calcium Carbonate-Vitamin D (CALTRATE 600+D PO) Take 1 tablet by mouth daily.  Marland Kitchen lisinopril-hydrochlorothiazide (PRINZIDE,ZESTORETIC) 10-12.5 MG per tablet Take 1 tablet by mouth daily.    . Misc Natural Products (COSAMIN ASU FOR JOINT HEALTH PO) Take 1 capsule by mouth daily.  . naproxen sodium (ALEVE) 220 MG tablet Take 440 mg by mouth daily as needed.  . Vitamin D, Ergocalciferol, 2000 units CAPS Take 1 capsule by mouth daily.    Allergies: Patient has no known allergies.   Review of Systems  Constitutional: Negative for chills, diaphoresis, fever, malaise/fatigue and weight loss.  HENT: Negative for congestion, sore throat and tinnitus.   Eyes: Negative for blurred vision, double vision and photophobia.  Respiratory: Negative for cough and wheezing.   Cardiovascular: Negative for chest pain and palpitations.  Gastrointestinal: Negative for blood in stool, diarrhea, nausea and vomiting.  Genitourinary: Negative for dysuria, frequency and urgency.  Musculoskeletal: Positive for joint pain. Negative for myalgias.  Skin: Negative for itching and rash.  Neurological: Negative for dizziness, focal weakness, weakness and headaches.  Endo/Heme/Allergies: Negative for environmental allergies and polydipsia. Does not bruise/bleed easily.  Psychiatric/Behavioral: Negative for depression and memory loss. The patient is not nervous/anxious and does not have insomnia.      Objective:   Blood pressure 117/76, pulse 94, height 5\' 6"  (1.676 m), weight 148 lb 14.4 oz (67.5 kg), last menstrual period 05/01/2009, SpO2 98 %. Body mass index is 24.03 kg/m. General: Well Developed, well nourished, and in no acute distress.  Neuro: Alert and oriented x3, extra-ocular muscles intact, sensation grossly intact.    HEENT:[place]/AT, PERRLA, neck supple, No carotid bruits Skin: no gross rashes  Cardiac: Regular rate and rhythm Respiratory: Essentially clear to auscultation bilaterally. Not using accessory muscles, speaking in full sentences.  Abdominal: not grossly distended Musculoskeletal: Ambulates w/o diff, FROM * 4 ext.  Vasc: less 2 sec cap RF, warm and pink  Psych:  No HI/SI, judgement and insight good, Euthymic mood. Full Affect.    No results found for this or any previous visit (from the past 2160 hour(s)).

## 2018-01-02 NOTE — Patient Instructions (Signed)
Ice 15 min 3-5 * day. sprts med referral   - Call and speak with TYler- our front desk if you have Q's about the referral by mid next wk.    Osteoarthritis Osteoarthritis is a type of arthritis that affects tissue that covers the ends of bones in joints (cartilage). Cartilage acts as a cushion between the bones and helps them move smoothly. Osteoarthritis results when cartilage in the joints gets worn down. Osteoarthritis is sometimes called "wear and tear" arthritis. Osteoarthritis is the most common form of arthritis. It often occurs in older people. It is a condition that gets worse over time (a progressive condition). Joints that are most often affected by this condition are in:  Fingers.  Toes.  Hips.  Knees.  Spine, including neck and lower back.  What are the causes? This condition is caused by age-related wearing down of cartilage that covers the ends of bones. What increases the risk? The following factors may make you more likely to develop this condition:  Older age.  Being overweight or obese.  Overuse of joints, such as in athletes.  Past injury of a joint.  Past surgery on a joint.  Family history of osteoarthritis.  What are the signs or symptoms? The main symptoms of this condition are pain, swelling, and stiffness in the joint. The joint may lose its shape over time. Small pieces of bone or cartilage may break off and float inside of the joint, which may cause more pain and damage to the joint. Small deposits of bone (osteophytes) may grow on the edges of the joint. Other symptoms may include:  A grating or scraping feeling inside the joint when you move it.  Popping or creaking sounds when you move.  Symptoms may affect one or more joints. Osteoarthritis in a major joint, such as your knee or hip, can make it painful to walk or exercise. If you have osteoarthritis in your hands, you might not be able to grip items, twist your hand, or control small  movements of your hands and fingers (fine motor skills). How is this diagnosed? This condition may be diagnosed based on:  Your medical history.  A physical exam.  Your symptoms.  X-rays of the affected joint(s).  Blood tests to rule out other types of arthritis.  How is this treated? There is no cure for this condition, but treatment can help to control pain and improve joint function. Treatment plans may include:  A prescribed exercise program that allows for rest and joint relief. You may work with a physical therapist.  A weight control plan.  Pain relief techniques, such as: ? Applying heat and cold to the joint. ? Electric pulses delivered to nerve endings under the skin (transcutaneous electrical nerve stimulation, or TENS). ? Massage. ? Certain nutritional supplements.  NSAIDs or prescription medicines to help relieve pain.  Medicine to help relieve pain and inflammation (corticosteroids). This can be given by mouth (orally) or as an injection.  Assistive devices, such as a brace, wrap, splint, specialized glove, or cane.  Surgery, such as: ? An osteotomy. This is done to reposition the bones and relieve pain or to remove loose pieces of bone and cartilage. ? Joint replacement surgery. You may need this surgery if you have very bad (advanced) osteoarthritis.  Follow these instructions at home: Activity  Rest your affected joints as directed by your health care provider.  Do not drive or use heavy machinery while taking prescription pain medicine.  Exercise as  directed. Your health care provider or physical therapist may recommend specific types of exercise, such as: ? Strengthening exercises. These are done to strengthen the muscles that support joints that are affected by arthritis. They can be performed with weights or with exercise bands to add resistance. ? Aerobic activities. These are exercises, such as brisk walking or water aerobics, that get your heart  pumping. ? Range-of-motion activities. These keep your joints easy to move. ? Balance and agility exercises. Managing pain, stiffness, and swelling  If directed, apply heat to the affected area as often as told by your health care provider. Use the heat source that your health care provider recommends, such as a moist heat pack or a heating pad. ? If you have a removable assistive device, remove it as told by your health care provider. ? Place a towel between your skin and the heat source. If your health care provider tells you to keep the assistive device on while you apply heat, place a towel between the assistive device and the heat source. ? Leave the heat on for 20-30 minutes. ? Remove the heat if your skin turns bright red. This is especially important if you are unable to feel pain, heat, or cold. You may have a greater risk of getting burned.  If directed, put ice on the affected joint: ? If you have a removable assistive device, remove it as told by your health care provider. ? Put ice in a plastic bag. ? Place a towel between your skin and the bag. If your health care provider tells you to keep the assistive device on during icing, place a towel between the assistive device and the bag. ? Leave the ice on for 20 minutes, 2-3 times a day. General instructions  Take over-the-counter and prescription medicines only as told by your health care provider.  Maintain a healthy weight. Follow instructions from your health care provider for weight control. These may include dietary restrictions.  Do not use any products that contain nicotine or tobacco, such as cigarettes and e-cigarettes. These can delay bone healing. If you need help quitting, ask your health care provider.  Use assistive devices as directed by your health care provider.  Keep all follow-up visits as told by your health care provider. This is important. Where to find more information:  [employer] of Arthritis  and Musculoskeletal and Skin Diseases: www.niams.SouthExposed.es  [employer] on Aging: http://kim-miller.com/  American College of Rheumatology: www.rheumatology.org Contact a health care provider if:  Your skin turns red.  You develop a rash.  You have pain that gets worse.  You have a fever along with joint or muscle aches. Get help right away if:  You lose a lot of weight.  You suddenly lose your appetite.  You have night sweats. Summary  Osteoarthritis is a type of arthritis that affects tissue covering the ends of bones in joints (cartilage).  This condition is caused by age-related wearing down of cartilage that covers the ends of bones.  The main symptom of this condition is pain, swelling, and stiffness in the joint.  There is no cure for this condition, but treatment can help to control pain and improve joint function. This information is not intended to replace advice given to you by your health care provider. Make sure you discuss any questions you have with your health care provider. Document Released: 08/19/2005 Document Revised: 04/22/2016 Document Reviewed: 04/22/2016 Elsevier Interactive Patient Education  2018 [place] Pain Joint pain,  which is also called arthralgia, can be caused by many things. Joint pain often goes away when you follow your health care provider's instructions for relieving pain at home. However, joint pain can also be caused by conditions that require further treatment. Common causes of joint pain include:  Bruising in the area of the joint.  Overuse of the joint.  Wear and tear on the joints that occur with aging (osteoarthritis).  Various other forms of arthritis.  A buildup of a crystal form of uric acid in the joint (gout).  Infections of the joint (septic arthritis) or of the bone (osteomyelitis).  Your health care provider may recommend medicine to help with the pain. If your joint pain continues, additional tests may be  needed to diagnose your condition. Follow these instructions at home: Watch your condition for any changes. Follow these instructions as directed to lessen the pain that you are feeling.  Take medicines only as directed by your health care provider.  Rest the affected area for as long as your health care provider says that you should. If directed to do so, raise the painful joint above the level of your heart while you are sitting or lying down.  Do not do things that cause or worsen pain.  If directed, apply ice to the painful area: ? Put ice in a plastic bag. ? Place a towel between your skin and the bag. ? Leave the ice on for 20 minutes, 2-3 times per day.  Wear an elastic bandage, splint, or sling as directed by your health care provider. Loosen the elastic bandage or splint if your fingers or toes become numb and tingle, or if they turn cold and blue.  Begin exercising or stretching the affected area as directed by your health care provider. Ask your health care provider what types of exercise are safe for you.  Keep all follow-up visits as directed by your health care provider. This is important.  Contact a health care provider if:  Your pain increases, and medicine does not help.  Your joint pain does not improve within 3 days.  You have increased bruising or swelling.  You have a fever.  You lose 10 lb (4.5 kg) or more without trying. Get help right away if:  You are not able to move the joint.  Your fingers or toes become numb or they turn cold and blue. This information is not intended to replace advice given to you by your health care provider. Make sure you discuss any questions you have with your health care provider. Document Released: 08/19/2005 Document Revised: 01/19/2016 Document Reviewed: 05/31/2014 Elsevier Interactive Patient Education  [employer].

## 2018-01-27 NOTE — Progress Notes (Signed)
[name] [address] [place], [place] [address] Phone: [phone] Subjective:    I'm seeing this patient by the request  of:  Mellody Dance, DO   CC: Left hip pain  CVE:[id number]  Cristina Conner is a 57 y.o. female coming in with complaint of left hip pain. States the pain is in her joint. Can't put pressure on right hip first thing in the morning. Also has lower back pain.  Onset- Chronic Location- Groin, GT Duration- Worse first thing in the morning Character- Sharp, dull  Aggravating factors-exercise Reliving factors- Hot showers, Ice, pain meds  Therapies tried-ice and some over-the-counter medications. Severity-9 out of 10 unable to workout regularly     Past Medical History:  Diagnosis Date  . Hypertension   . Osteopenia   . Post-operative nausea and vomiting    vomiting after general anesthesia per pt.   Past Surgical History:  Procedure Laterality Date  . CESAREAN SECTION     x 2  . ENDOMETRIAL ABLATION    . PELVIC LAPAROSCOPY  1999   left salpingectomy, excision of Right, paratubal cyst  . PELVIC LAPAROSCOPY     laparoscopy with lysis of pelvic adhesions  . SHOULDER SURGERY  11/2010   "FROZEN SHOULDER"  . TUBAL LIGATION     Social History   Socioeconomic History  . Marital status: Married    Spouse name: Not on file  . Number of children: Not on file  . Years of education: Not on file  . Highest education level: Not on file  Occupational History  . Not on file  Social Needs  . Financial resource strain: Not on file  . Food insecurity:    Worry: Not on file    Inability: Not on file  . Transportation needs:    Medical: Not on file    Non-medical: Not on file  Tobacco Use  . Smoking status: Never Smoker  . Smokeless tobacco: Never Used  Substance and Sexual Activity  . Alcohol use: Yes    Comment: rare  . Drug use: No  . Sexual activity: Not Currently    Partners: Male    Birth control/protection:  Post-menopausal    Comment: 1st intercourse- 37, partners- 47,  married- 23 yrs   Lifestyle  . Physical activity:    Days per week: Not on file    Minutes per session: Not on file  . Stress: Not on file  Relationships  . Social connections:    Talks on phone: Not on file    Gets together: Not on file    Attends religious service: Not on file    Active member of club or organization: Not on file    Attends meetings of clubs or organizations: Not on file    Relationship status: Not on file  Other Topics Concern  . Not on file  Social History Narrative  . Not on file   No Known Allergies Family History  Problem Relation Age of Onset  . Hypertension Mother   . Heart disease Mother   . Diabetes Mother   . Hypertension Father   . Colon cancer Neg Hx      Past medical history, social, surgical and family history all reviewed in electronic medical record.  No pertanent information unless stated regarding to the chief complaint.   Review of Systems:Review of systems updated and as accurate as of 01/29/18  No headache, visual changes, nausea, vomiting, diarrhea, constipation, dizziness, abdominal pain,  skin rash, fevers, chills, night sweats, weight loss, swollen lymph nodes, body aches, joint swelling, muscle aches, chest pain, shortness of breath, mood changes.   Objective  Blood pressure (!) 148/80, pulse 81, height 5\' 6"  (1.676 m), weight 150 lb (68 kg), last menstrual period 05/01/2009, SpO2 96 %. Systems examined below as of 01/29/18   General: No apparent distress alert and oriented x3 mood and affect normal, dressed appropriately.  HEENT: Pupils equal, extraocular movements intact  Respiratory: Patient's speak in full sentences and does not appear short of breath  Cardiovascular: No lower extremity edema, non tender, no erythema  Skin: Warm dry intact with no signs of infection or rash on extremities or on axial skeleton.  Abdomen: Soft nontender  Neuro: Cranial nerves II  through XII are intact, neurovascularly intact in all extremities with 2+ DTRs and 2+ pulses.  Lymph: No lymphadenopathy of posterior or anterior cervical chain or axillae bilaterally.  Gait .  Antalgic MSK:  Non tender with full range of motion and good stability and symmetric strength and tone of shoulders, elbows, wrist,  knee and ankles bilaterally.  Left hip shows the patient does have loss of range of motion in all planes with 0 degrees of internal rotation and severe pain in the groin area.  Mild crepitus.  Tightness with Corky Sox.  Negative straight leg test.  Patient has 4 out of 5 strength compared to the contralateral side with hip flexion.  Neurovascular intact distally.  Limited musculoskeletal ultrasound was performed and interpreted by Lyndal Pulley  Patient's left hip does show moderate to severe narrowing of the joint space.  Patient does still has some mild articular cartilage anteriorly.  No significant joint effusion. Impression: Left hip arthritis    Impression and Recommendations:     This case required medical decision making of moderate complexity.      Note: This dictation was prepared with Dragon dictation along with smaller phrase technology. Any transcriptional errors that result from this process are unintentional.

## 2018-01-29 ENCOUNTER — Ambulatory Visit (INDEPENDENT_AMBULATORY_CARE_PROVIDER_SITE_OTHER)
Admission: RE | Admit: 2018-01-29 | Discharge: 2018-01-29 | Disposition: A | Discharge status: Home / Self Care | Payer: BC Managed Care – PPO | Source: Ambulatory Visit | Attending: Family Medicine | Admitting: Family Medicine

## 2018-01-29 ENCOUNTER — Encounter: Payer: Self-pay | Admitting: Family Medicine

## 2018-01-29 ENCOUNTER — Ambulatory Visit (INDEPENDENT_AMBULATORY_CARE_PROVIDER_SITE_OTHER): Payer: BC Managed Care – PPO | Admitting: Family Medicine

## 2018-01-29 ENCOUNTER — Ambulatory Visit: Payer: Self-pay

## 2018-01-29 VITALS — BP 148/80 | HR 81 | Ht 66.0 in | Wt 150.0 lb

## 2018-01-29 DIAGNOSIS — M25552 Pain in left hip: Secondary | ICD-10-CM

## 2018-01-29 DIAGNOSIS — M1612 Unilateral primary osteoarthritis, left hip: Secondary | ICD-10-CM | POA: Diagnosis not present

## 2018-01-29 MED ORDER — VITAMIN D (ERGOCALCIFEROL) 1.25 MG (50000 UNIT) PO CAPS: 50000.0000 [IU] | ORAL_CAPSULE | ORAL | 0 refills | Status: DC

## 2018-01-29 NOTE — Assessment & Plan Note (Signed)
We discussed with patient in great length.  Discussed with patient at this morning moderate to severe arthritic changes.  Patient is limited range of motion.  X-rays are pending.  Ultrasound does show significant narrowing noted.  We discussed the possibility of injections which patient declined at the moment.  Wants to try conservative therapy.  We discussed once weekly vitamin D, over-the-counter medications which some have been already suggested by primary care provider.  Discussed icing regimen.  Follow-up again in 3 weeks.  At that time we will consider injection.

## 2018-01-29 NOTE — Patient Instructions (Signed)
Good to see you  I am sorry for the potential bad news We will get xrays downstairs Once weekly vitamin D for 12 weeks Turmeric 500mg  daily over the counter Tart cherry extract any dose at night Exercises 3 times a week.  I would recommend more low impact exercises like biking, elliptical and swimming See me again In 3 weeks

## 2018-02-13 ENCOUNTER — Other Ambulatory Visit: Payer: Self-pay | Admitting: Family Medicine

## 2018-02-24 ENCOUNTER — Other Ambulatory Visit: Payer: Self-pay | Admitting: Family Medicine

## 2018-02-24 DIAGNOSIS — M1612 Unilateral primary osteoarthritis, left hip: Secondary | ICD-10-CM

## 2018-02-24 NOTE — Progress Notes (Signed)
[name] [address] [place], [place] [address] Phone: [phone] Subjective:     CC: Left hip pain follow-up  FTD:[id number]  Cristina Conner is a 57 y.o. female coming in with complaint of left hip pain.  There was a concern for patient having hip arthritis.  Sent for x-rays after last exam that were independently visualized by me showing severe arthritic changes of the left hip.  Patient is started the over-the-counter medications, icing regimen, topical medications and states that she is feeling 20 to 30% better.  Still has difficulty with after sitting a long amount of time or for steps in the morning but does seem to be improved.     Past Medical History:  Diagnosis Date  . Hypertension   . Osteopenia   . Post-operative nausea and vomiting    vomiting after general anesthesia per pt.   Past Surgical History:  Procedure Laterality Date  . CESAREAN SECTION     x 2  . ENDOMETRIAL ABLATION    . PELVIC LAPAROSCOPY  1999   left salpingectomy, excision of Right, paratubal cyst  . PELVIC LAPAROSCOPY     laparoscopy with lysis of pelvic adhesions  . SHOULDER SURGERY  11/2010   "FROZEN SHOULDER"  . TUBAL LIGATION     Social History   Socioeconomic History  . Marital status: Married    Spouse name: Not on file  . Number of children: Not on file  . Years of education: Not on file  . Highest education level: Not on file  Occupational History  . Not on file  Social Needs  . Financial resource strain: Not on file  . Food insecurity:    Worry: Not on file    Inability: Not on file  . Transportation needs:    Medical: Not on file    Non-medical: Not on file  Tobacco Use  . Smoking status: Never Smoker  . Smokeless tobacco: Never Used  Substance and Sexual Activity  . Alcohol use: Yes    Comment: rare  . Drug use: No  . Sexual activity: Not Currently    Partners: Male    Birth control/protection: Post-menopausal    Comment: 1st  intercourse- 77, partners- 65,  married- 29 yrs   Lifestyle  . Physical activity:    Days per week: Not on file    Minutes per session: Not on file  . Stress: Not on file  Relationships  . Social connections:    Talks on phone: Not on file    Gets together: Not on file    Attends religious service: Not on file    Active member of club or organization: Not on file    Attends meetings of clubs or organizations: Not on file    Relationship status: Not on file  Other Topics Concern  . Not on file  Social History Narrative  . Not on file   No Known Allergies Family History  Problem Relation Age of Onset  . Hypertension Mother   . Heart disease Mother   . Diabetes Mother   . Hypertension Father   . Colon cancer Neg Hx      Past medical history, social, surgical and family history all reviewed in electronic medical record.  No pertanent information unless stated regarding to the chief complaint.   Review of Systems:Review of systems updated and as accurate as of 02/24/18  No headache, visual changes, nausea, vomiting, diarrhea, constipation, dizziness, abdominal pain, skin  rash, fevers, chills, night sweats, weight loss, swollen lymph nodes, body aches, joint swelling, muscle aches, chest pain, shortness of breath, mood changes.   Objective  Last menstrual period 05/01/2009. Systems examined below as of 02/24/18   General: No apparent distress alert and oriented x3 mood and affect normal, dressed appropriately.  HEENT: Pupils equal, extraocular movements intact  Respiratory: Patient's speak in full sentences and does not appear short of breath  Cardiovascular: No lower extremity edema, non tender, no erythema  Skin: Warm dry intact with no signs of infection or rash on extremities or on axial skeleton.  Abdomen: Soft nontender  Neuro: Cranial nerves II through XII are intact, neurovascularly intact in all extremities with 2+ DTRs and 2+ pulses.  Lymph: No lymphadenopathy of  posterior or anterior cervical chain or axillae bilaterally.  Gait antalgic gait MSK:  Non tender with full range of motion and good stability and symmetric strength and tone of shoulders, elbows, wrist,  knee and ankles bilaterally.  Left hip exam shows no internal range of motion.  Does have also only 20 degrees of external range of motion.  Mild crepitus noted.  Pain in the groin area.  Forward flexion to 120 degrees and extension of 45 degrees.  Procedure: Real-time Ultrasound Guided Injection of left intra-articular hip Device: GE Logiq Q7  Ultrasound guided injection is preferred based studies that show increased duration, increased effect, greater accuracy, decreased procedural pain, increased response rate with ultrasound guided versus blind injection.  Verbal informed consent obtained.  Time-out conducted.  Noted no overlying erythema, induration, or other signs of local infection.  Skin prepped in a sterile fashion.  Local anesthesia: Topical Ethyl chloride.  With sterile technique and under real time ultrasound guidance:  Anterior capsule visualized, needle visualized going to the head neck junction at the anterior capsule. Pictures taken. Patient did have injection of 3 cc of 0.5% Marcaine, and 1 cc of Kenalog 40 mg/dL. Completed without difficulty  Pain immediately resolved suggesting accurate placement of the medication.  Advised to call if fevers/chills, erythema, induration, drainage, or persistent bleeding.  Images permanently stored and available for review in the ultrasound unit.  Impression: Technically successful ultrasound guided injection.    Impression and Recommendations:     This case required medical decision making of moderate complexity.      Note: This dictation was prepared with Dragon dictation along with smaller phrase technology. Any transcriptional errors that result from this process are unintentional.

## 2018-02-24 NOTE — Telephone Encounter (Signed)
Patient had her new patient appointment with our office on 01/02/2018 and was started on Mobic for osteoarthritis pain in the left hip and was referred to sports medicine.  Patient has seen Dr. Hulan Saas in 01/29/2018 and is set up to follow up with Dr. Raliegh Scarlet on 03/04/2018.  Please advise if okay to refill or if patient needs to go through Dr. Tamala Julian for the medication. MPulliam, CMA/RT(R)

## 2018-02-25 ENCOUNTER — Encounter: Payer: Self-pay | Admitting: Family Medicine

## 2018-02-25 ENCOUNTER — Ambulatory Visit: Payer: BC Managed Care – PPO | Admitting: Family Medicine

## 2018-02-25 ENCOUNTER — Ambulatory Visit: Payer: Self-pay

## 2018-02-25 VITALS — BP 124/80 | HR 76 | Ht 66.0 in | Wt 154.0 lb

## 2018-02-25 DIAGNOSIS — M1612 Unilateral primary osteoarthritis, left hip: Secondary | ICD-10-CM | POA: Diagnosis not present

## 2018-02-25 DIAGNOSIS — M25559 Pain in unspecified hip: Secondary | ICD-10-CM

## 2018-02-25 NOTE — Assessment & Plan Note (Signed)
Left hip injected today with near complete resolution of pain.  Patient was walking significantly better.  I do believe that the severity of the arthritis patient will be needing a replacement at some point probably in the next 1 to 2 years at maximum.  Hopefully patient does respond well though.  Continue the vitamin D and the over-the-counter medications.  Follow-up with me again 4 to 6 weeks to see how patient is responding.

## 2018-02-25 NOTE — Patient Instructions (Signed)
Good to see you  Cristina Conner is your friend. Ice 20 minutes 2 times daily. Usually after activity and before bed. Stay active Turmeric 500mg  twice daily could be good  Lets see how the injeciton does See me again in 4-6 weeks if not great otherwise see me when you need me

## 2018-02-26 NOTE — Telephone Encounter (Signed)
Since pt was referred to Dr Tamala Julian for this, should get txmnt from Drain provider- Tamala Julian. thnx

## 2018-02-27 NOTE — Telephone Encounter (Signed)
Patient notified. MPulliam, CMA/RT(R)  

## 2018-03-04 ENCOUNTER — Ambulatory Visit (INDEPENDENT_AMBULATORY_CARE_PROVIDER_SITE_OTHER): Payer: BC Managed Care – PPO | Admitting: Family Medicine

## 2018-03-04 ENCOUNTER — Encounter: Payer: Self-pay | Admitting: Family Medicine

## 2018-03-04 VITALS — BP 121/79 | HR 90 | Ht 66.0 in | Wt 156.9 lb

## 2018-03-04 DIAGNOSIS — Z719 Counseling, unspecified: Secondary | ICD-10-CM | POA: Diagnosis not present

## 2018-03-04 DIAGNOSIS — Z79890 Hormone replacement therapy (postmenopausal): Secondary | ICD-10-CM

## 2018-03-04 DIAGNOSIS — M8589 Other specified disorders of bone density and structure, multiple sites: Secondary | ICD-10-CM | POA: Diagnosis not present

## 2018-03-04 DIAGNOSIS — Z0000 Encounter for general adult medical examination without abnormal findings: Secondary | ICD-10-CM | POA: Diagnosis not present

## 2018-03-04 DIAGNOSIS — I10 Essential (primary) hypertension: Secondary | ICD-10-CM

## 2018-03-04 NOTE — Patient Instructions (Signed)
Preventive Care for Adults, Female  A healthy lifestyle and preventive care can promote health and wellness. Preventive health guidelines for women include the following key practices.   A routine yearly physical is a good way to check with your health care provider about your health and preventive screening. It is a chance to share any concerns and updates on your health and to receive a thorough exam.   Visit your dentist for a routine exam and preventive care every 6 months. Brush your teeth twice a day and floss once a day. Good oral hygiene prevents tooth decay and gum disease.   The frequency of eye exams is based on your age, health, family medical history, use of contact lenses, and other factors. Follow your health care provider's recommendations for frequency of eye exams.   Eat a healthy diet. Foods like vegetables, fruits, whole grains, low-fat dairy products, and lean protein foods contain the nutrients you need without too many calories. Decrease your intake of foods high in solid fats, added sugars, and salt. Eat the right amount of calories for you.Get information about a proper diet from your health care provider, if necessary.   Regular physical exercise is one of the most important things you can do for your health. Most adults should get at least 150 minutes of moderate-intensity exercise (any activity that increases your heart rate and causes you to sweat) each week. In addition, most adults need muscle-strengthening exercises on 2 or more days a week.   Maintain a healthy weight. The body mass index (BMI) is a screening tool to identify possible weight problems. It provides an estimate of body fat based on height and weight. Your health care provider can find your BMI, and can help you achieve or maintain a healthy weight.For adults 20 years and older:   - A BMI below 18.5 is considered underweight.   - A BMI of 18.5 to 24.9 is normal.   - A BMI of 25 to 29.9 is  considered overweight.   - A BMI of 30 and above is considered obese.   Maintain normal blood lipids and cholesterol levels by exercising and minimizing your intake of trans and saturated fats.  Eat a balanced diet with plenty of fruit and vegetables. Blood tests for lipids and cholesterol should begin at age 20 and be repeated every 5 years minimum.  If your lipid or cholesterol levels are high, you are over 40, or you are at high risk for heart disease, you may need your cholesterol levels checked more frequently.Ongoing high lipid and cholesterol levels should be treated with medicines if diet and exercise are not working.   If you smoke, find out from your health care provider how to quit. If you do not use tobacco, do not start.   Lung cancer screening is recommended for adults aged 55-80 years who are at high risk for developing lung cancer because of a history of smoking. A yearly low-dose CT scan of the lungs is recommended for people who have at least a 30-pack-year history of smoking and are a current smoker or have quit within the past 15 years. A pack year of smoking is smoking an average of 1 pack of cigarettes a day for 1 year (for example: 1 pack a day for 30 years or 2 packs a day for 15 years). Yearly screening should continue until the smoker has stopped smoking for at least 15 years. Yearly screening should be stopped for people who develop a   health problem that would prevent them from having lung cancer treatment.   If you are pregnant, do not drink alcohol. If you are breastfeeding, be very cautious about drinking alcohol. If you are not pregnant and choose to drink alcohol, do not have more than 1 drink per day. One drink is considered to be 12 ounces (355 mL) of beer, 5 ounces (148 mL) of wine, or 1.5 ounces (44 mL) of liquor.   Avoid use of street drugs. Do not share needles with anyone. Ask for help if you need support or instructions about stopping the use of  drugs.   High blood pressure causes heart disease and increases the risk of stroke. Your blood pressure should be checked at least yearly.  Ongoing high blood pressure should be treated with medicines if weight loss and exercise do not work.   If you are 69-55 years old, ask your health care provider if you should take aspirin to prevent strokes.   Diabetes screening involves taking a blood sample to check your fasting blood sugar level. This should be done once every 3 years, after age 38, if you are within normal weight and without risk factors for diabetes. Testing should be considered at a younger age or be carried out more frequently if you are overweight and have at least 1 risk factor for diabetes.   Breast cancer screening is essential preventive care for women. You should practice "breast self-awareness."  This means understanding the normal appearance and feel of your breasts and may include breast self-examination.  Any changes detected, no matter how small, should be reported to a health care provider.  Women in their 80s and 30s should have a clinical breast exam (CBE) by a health care provider as part of a regular health exam every 1 to 3 years.  After age 66, women should have a CBE every year.  Starting at age 1, women should consider having a mammogram (breast X-ray test) every year.  Women who have a family history of breast cancer should talk to their health care provider about genetic screening.  Women at a high risk of breast cancer should talk to their health care providers about having an MRI and a mammogram every year.   -Breast cancer gene (BRCA)-related cancer risk assessment is recommended for women who have family members with BRCA-related cancers. BRCA-related cancers include breast, ovarian, tubal, and peritoneal cancers. Having family members with these cancers may be associated with an increased risk for harmful changes (mutations) in the breast cancer genes BRCA1 and  BRCA2. Results of the assessment will determine the need for genetic counseling and BRCA1 and BRCA2 testing.   The Pap test is a screening test for cervical cancer. A Pap test can show cell changes on the cervix that might become cervical cancer if left untreated. A Pap test is a procedure in which cells are obtained and examined from the lower end of the uterus (cervix).   - Women should have a Pap test starting at age 57.   - Between ages 90 and 70, Pap tests should be repeated every 2 years.   - Beginning at age 63, you should have a Pap test every 3 years as long as the past 3 Pap tests have been normal.   - Some women have medical problems that increase the chance of getting cervical cancer. Talk to your health care provider about these problems. It is especially important to talk to your health care provider if a  new problem develops soon after your last Pap test. In these cases, your health care provider may recommend more frequent screening and Pap tests.   - The above recommendations are the same for women who have or have not gotten the vaccine for human papillomavirus (HPV).   - If you had a hysterectomy for a problem that was not cancer or a condition that could lead to cancer, then you no longer need Pap tests. Even if you no longer need a Pap test, a regular exam is a good idea to make sure no other problems are starting.   - If you are between ages 36 and 66 years, and you have had normal Pap tests going back 10 years, you no longer need Pap tests. Even if you no longer need a Pap test, a regular exam is a good idea to make sure no other problems are starting.   - If you have had past treatment for cervical cancer or a condition that could lead to cancer, you need Pap tests and screening for cancer for at least 20 years after your treatment.   - If Pap tests have been discontinued, risk factors (such as a new sexual partner) need to be reassessed to determine if screening should  be resumed.   - The HPV test is an additional test that may be used for cervical cancer screening. The HPV test looks for the virus that can cause the cell changes on the cervix. The cells collected during the Pap test can be tested for HPV. The HPV test could be used to screen women aged 70 years and older, and should be used in women of any age who have unclear Pap test results. After the age of 67, women should have HPV testing at the same frequency as a Pap test.   Colorectal cancer can be detected and often prevented. Most routine colorectal cancer screening begins at the age of 57 years and continues through age 26 years. However, your health care provider may recommend screening at an earlier age if you have risk factors for colon cancer. On a yearly basis, your health care provider may provide home test kits to check for hidden blood in the stool.  Use of a small camera at the end of a tube, to directly examine the colon (sigmoidoscopy or colonoscopy), can detect the earliest forms of colorectal cancer. Talk to your health care provider about this at age 23, when routine screening begins. Direct exam of the colon should be repeated every 5 -10 years through age 49 years, unless early forms of pre-cancerous polyps or small growths are found.   People who are at an increased risk for hepatitis B should be screened for this virus. You are considered at high risk for hepatitis B if:  -You were born in a country where hepatitis B occurs often. Talk with your health care provider about which countries are considered high risk.  - Your parents were born in a high-risk country and you have not received a shot to protect against hepatitis B (hepatitis B vaccine).  - You have HIV or AIDS.  - You use needles to inject street drugs.  - You live with, or have sex with, someone who has Hepatitis B.  - You get hemodialysis treatment.  - You take certain medicines for conditions like cancer, organ  transplantation, and autoimmune conditions.   Hepatitis C blood testing is recommended for all people born from [birth year] through 1965 and any individual  with known risks for hepatitis C.   Practice safe sex. Use condoms and avoid high-risk sexual practices to reduce the spread of sexually transmitted infections (STIs). STIs include gonorrhea, chlamydia, syphilis, trichomonas, herpes, HPV, and human immunodeficiency virus (HIV). Herpes, HIV, and HPV are viral illnesses that have no cure. They can result in disability, cancer, and death. Sexually active women aged 25 years and younger should be checked for chlamydia. Older women with new or multiple partners should also be tested for chlamydia. Testing for other STIs is recommended if you are sexually active and at increased risk.   Osteoporosis is a disease in which the bones lose minerals and strength with aging. This can result in serious bone fractures or breaks. The risk of osteoporosis can be identified using a bone density scan. Women ages 65 years and over and women at risk for fractures or osteoporosis should discuss screening with their health care providers. Ask your health care provider whether you should take a calcium supplement or vitamin D to There are also several preventive steps women can take to avoid osteoporosis and resulting fractures or to keep osteoporosis from worsening. -->Recommendations include:  Eat a balanced diet high in fruits, vegetables, calcium, and vitamins.  Get enough calcium. The recommended total intake of is 1,200 mg daily; for best absorption, if taking supplements, divide doses into 250-500 mg doses throughout the day. Of the two types of calcium, calcium carbonate is best absorbed when taken with food but calcium citrate can be taken on an empty stomach.  Get enough vitamin D. NAMS and the National Osteoporosis Foundation recommend at least 1,000 IU per day for women age 50 and over who are at risk of vitamin D  deficiency. Vitamin D deficiency can be caused by inadequate sun exposure (for example, those who live in northern latitudes).  Avoid alcohol and smoking. Heavy alcohol intake (more than 7 drinks per week) increases the risk of falls and hip fracture and women smokers tend to lose bone more rapidly and have lower bone mass than nonsmokers. Stopping smoking is one of the most important changes women can make to improve their health and decrease risk for disease.  Be physically active every day. Weight-bearing exercise (for example, fast walking, hiking, jogging, and weight training) may strengthen bones or slow the rate of bone loss that comes with aging. Balancing and muscle-strengthening exercises can reduce the risk of falling and fracture.  Consider therapeutic medications. Currently, several types of effective drugs are available. Healthcare providers can recommend the type most appropriate for each woman.  Eliminate environmental factors that may contribute to accidents. Falls cause nearly 90% of all osteoporotic fractures, so reducing this risk is an important bone-health strategy. Measures include ample lighting, removing obstructions to walking, using nonskid rugs on floors, and placing mats and/or grab bars in showers.  Be aware of medication side effects. Some common medicines make bones weaker. These include a type of steroid drug called glucocorticoids used for arthritis and asthma, some antiseizure drugs, certain sleeping pills, treatments for endometriosis, and some cancer drugs. An overactive thyroid gland or using too much thyroid hormone for an underactive thyroid can also be a problem. If you are taking these medicines, talk to your doctor about what you can do to help protect your bones.reduce the rate of osteoporosis.    Menopause can be associated with physical symptoms and risks. Hormone replacement therapy is available to decrease symptoms and risks. You should talk to your  health care provider   about whether hormone replacement therapy is right for you.   Use sunscreen. Apply sunscreen liberally and repeatedly throughout the day. You should seek shade when your shadow is shorter than you. Protect yourself by wearing long sleeves, pants, a wide-brimmed hat, and sunglasses year round, whenever you are outdoors.   Once a month, do a whole body skin exam, using a mirror to look at the skin on your back. Tell your health care provider of new moles, moles that have irregular borders, moles that are larger than a pencil eraser, or moles that have changed in shape or color.   -Stay current with required vaccines (immunizations).   Influenza vaccine. All adults should be immunized every year.  Tetanus, diphtheria, and acellular pertussis (Td, Tdap) vaccine. Pregnant women should receive 1 dose of Tdap vaccine during each pregnancy. The dose should be obtained regardless of the length of time since the last dose. Immunization is preferred during the 27th 36th week of gestation. An adult who has not previously received Tdap or who does not know her vaccine status should receive 1 dose of Tdap. This initial dose should be followed by tetanus and diphtheria toxoids (Td) booster doses every 10 years. Adults with an unknown or incomplete history of completing a 3-dose immunization series with Td-containing vaccines should begin or complete a primary immunization series including a Tdap dose. Adults should receive a Td booster every 10 years.  Varicella vaccine. An adult without evidence of immunity to varicella should receive 2 doses or a second dose if she has previously received 1 dose. Pregnant females who do not have evidence of immunity should receive the first dose after pregnancy. This first dose should be obtained before leaving the health care facility. The second dose should be obtained 4 8 weeks after the first dose.  Human papillomavirus (HPV) vaccine. Females aged 13 26  years who have not received the vaccine previously should obtain the 3-dose series. The vaccine is not recommended for use in pregnant females. However, pregnancy testing is not needed before receiving a dose. If a female is found to be pregnant after receiving a dose, no treatment is needed. In that case, the remaining doses should be delayed until after the pregnancy. Immunization is recommended for any person with an immunocompromised condition through the age of 26 years if she did not get any or all doses earlier. During the 3-dose series, the second dose should be obtained 4 8 weeks after the first dose. The third dose should be obtained 24 weeks after the first dose and 16 weeks after the second dose.  Zoster vaccine. One dose is recommended for adults aged 60 years or older unless certain conditions are present.  Measles, mumps, and rubella (MMR) vaccine. Adults born before 1957 generally are considered immune to measles and mumps. Adults born in 1957 or later should have 1 or more doses of MMR vaccine unless there is a contraindication to the vaccine or there is laboratory evidence of immunity to each of the three diseases. A routine second dose of MMR vaccine should be obtained at least 28 days after the first dose for students attending postsecondary schools, health care workers, or international travelers. People who received inactivated measles vaccine or an unknown type of measles vaccine during 1963 1967 should receive 2 doses of MMR vaccine. People who received inactivated mumps vaccine or an unknown type of mumps vaccine before 1979 and are at high risk for mumps infection should consider immunization with 2 doses of   MMR vaccine. For females of childbearing age, rubella immunity should be determined. If there is no evidence of immunity, females who are not pregnant should be vaccinated. If there is no evidence of immunity, females who are pregnant should delay immunization until after pregnancy.  Unvaccinated health care workers born before [birth year] who lack laboratory evidence of measles, mumps, or rubella immunity or laboratory confirmation of disease should consider measles and mumps immunization with 2 doses of MMR vaccine or rubella immunization with 1 dose of MMR vaccine.  Pneumococcal 13-valent conjugate (PCV13) vaccine. When indicated, a person who is uncertain of her immunization history and has no record of immunization should receive the PCV13 vaccine. An adult aged 54 years or older who has certain medical conditions and has not been previously immunized should receive 1 dose of PCV13 vaccine. This PCV13 should be followed with a dose of pneumococcal polysaccharide (PPSV23) vaccine. The PPSV23 vaccine dose should be obtained at least 8 weeks after the dose of PCV13 vaccine. An adult aged 58 years or older who has certain medical conditions and previously received 1 or more doses of PPSV23 vaccine should receive 1 dose of PCV13. The PCV13 vaccine dose should be obtained 1 or more years after the last PPSV23 vaccine dose.  Pneumococcal polysaccharide (PPSV23) vaccine. When PCV13 is also indicated, PCV13 should be obtained first. All adults aged 58 years and older should be immunized. An adult younger than age 65 years who has certain medical conditions should be immunized. Any person who resides in a nursing home or long-term care facility should be immunized. An adult smoker should be immunized. People with an immunocompromised condition and certain other conditions should receive both PCV13 and PPSV23 vaccines. People with human immunodeficiency virus (HIV) infection should be immunized as soon as possible after diagnosis. Immunization during chemotherapy or radiation therapy should be avoided. Routine use of PPSV23 vaccine is not recommended for American Indians, [place] Natives, or people younger than 65 years unless there are medical conditions that require PPSV23 vaccine. When indicated,  people who have unknown immunization and have no record of immunization should receive PPSV23 vaccine. One-time revaccination 5 years after the first dose of PPSV23 is recommended for people aged 70 64 years who have chronic kidney failure, nephrotic syndrome, asplenia, or immunocompromised conditions. People who received 1 2 doses of PPSV23 before age 32 years should receive another dose of PPSV23 vaccine at age 96 years or later if at least 5 years have passed since the previous dose. Doses of PPSV23 are not needed for people immunized with PPSV23 at or after age 55 years.  Meningococcal vaccine. Adults with asplenia or persistent complement component deficiencies should receive 2 doses of quadrivalent meningococcal conjugate (MenACWY-D) vaccine. The doses should be obtained at least 2 months apart. Microbiologists working with certain meningococcal bacteria, [employer] recruits, people at risk during an outbreak, and people who travel to or live in countries with a high rate of meningitis should be immunized. A first-year college student up through age 58 years who is living in a residence hall should receive a dose if she did not receive a dose on or after her 16th birthday. Adults who have certain high-risk conditions should receive one or more doses of vaccine.  Hepatitis A vaccine. Adults who wish to be protected from this disease, have certain high-risk conditions, work with hepatitis A-infected animals, work in hepatitis A research labs, or travel to or work in countries with a high rate of hepatitis A should be  immunized. Adults who were previously unvaccinated and who anticipate close contact with an international adoptee during the first 60 days after arrival in the [place] from a country with a high rate of hepatitis A should be immunized.  Hepatitis B vaccine.  Adults who wish to be protected from this disease, have certain high-risk conditions, may be exposed to blood or other infectious  body fluids, are household contacts or sex partners of hepatitis B positive people, are clients or workers in certain care facilities, or travel to or work in countries with a high rate of hepatitis B should be immunized.  Haemophilus influenzae type b (Hib) vaccine. A previously unvaccinated person with asplenia or sickle cell disease or having a scheduled splenectomy should receive 1 dose of Hib vaccine. Regardless of previous immunization, a recipient of a hematopoietic stem cell transplant should receive a 3-dose series 6 12 months after her successful transplant. Hib vaccine is not recommended for adults with HIV infection.  Preventive Services / Frequency Ages 6 to 39years  Blood pressure check.** / Every 1 to 2 years.  Lipid and cholesterol check.** / Every 5 years beginning at age 39.  Clinical breast exam.** / Every 3 years for women in their 61s and 62s.  BRCA-related cancer risk assessment.** / For women who have family members with a BRCA-related cancer (breast, ovarian, tubal, or peritoneal cancers).  Pap test.** / Every 2 years from ages 47 through 85. Every 3 years starting at age 34 through age 12 or 74 with a history of 3 consecutive normal Pap tests.  HPV screening.** / Every 3 years from ages 46 through ages 43 to 54 with a history of 3 consecutive normal Pap tests.  Hepatitis C blood test.** / For any individual with known risks for hepatitis C.  Skin self-exam. / Monthly.  Influenza vaccine. / Every year.  Tetanus, diphtheria, and acellular pertussis (Tdap, Td) vaccine.** / Consult your health care provider. Pregnant women should receive 1 dose of Tdap vaccine during each pregnancy. 1 dose of Td every 10 years.  Varicella vaccine.** / Consult your health care provider. Pregnant females who do not have evidence of immunity should receive the first dose after pregnancy.  HPV vaccine. / 3 doses over 6 months, if 64 and younger. The vaccine is not recommended for use in  pregnant females. However, pregnancy testing is not needed before receiving a dose.  Measles, mumps, rubella (MMR) vaccine.** / You need at least 1 dose of MMR if you were born in 1957 or later. You may also need a 2nd dose. For females of childbearing age, rubella immunity should be determined. If there is no evidence of immunity, females who are not pregnant should be vaccinated. If there is no evidence of immunity, females who are pregnant should delay immunization until after pregnancy.  Pneumococcal 13-valent conjugate (PCV13) vaccine.** / Consult your health care provider.  Pneumococcal polysaccharide (PPSV23) vaccine.** / 1 to 2 doses if you smoke cigarettes or if you have certain conditions.  Meningococcal vaccine.** / 1 dose if you are age 71 to 38 years and a Market researcher living in a residence hall, or have one of several medical conditions, you need to get vaccinated against meningococcal disease. You may also need additional booster doses.  Hepatitis A vaccine.** / Consult your health care provider.  Hepatitis B vaccine.** / Consult your health care provider.  Haemophilus influenzae type b (Hib) vaccine.** / Consult your health care provider.  Ages 55 to 64years  Blood pressure check.** / Every 1 to 2 years.  Lipid and cholesterol check.** / Every 5 years beginning at age 20 years.  Lung cancer screening. / Every year if you are aged 55 80 years and have a 30-pack-year history of smoking and currently smoke or have quit within the past 15 years. Yearly screening is stopped once you have quit smoking for at least 15 years or develop a health problem that would prevent you from having lung cancer treatment.  Clinical breast exam.** / Every year after age 40 years.  BRCA-related cancer risk assessment.** / For women who have family members with a BRCA-related cancer (breast, ovarian, tubal, or peritoneal cancers).  Mammogram.** / Every year beginning at age 40  years and continuing for as long as you are in good health. Consult with your health care provider.  Pap test.** / Every 3 years starting at age 30 years through age 65 or 70 years with a history of 3 consecutive normal Pap tests.  HPV screening.** / Every 3 years from ages 30 years through ages 65 to 70 years with a history of 3 consecutive normal Pap tests.  Fecal occult blood test (FOBT) of stool. / Every year beginning at age 50 years and continuing until age 75 years. You may not need to do this test if you get a colonoscopy every 10 years.  Flexible sigmoidoscopy or colonoscopy.** / Every 5 years for a flexible sigmoidoscopy or every 10 years for a colonoscopy beginning at age 50 years and continuing until age 75 years.  Hepatitis C blood test.** / For all people born from 1945 through 1965 and any individual with known risks for hepatitis C.  Skin self-exam. / Monthly.  Influenza vaccine. / Every year.  Tetanus, diphtheria, and acellular pertussis (Tdap/Td) vaccine.** / Consult your health care provider. Pregnant women should receive 1 dose of Tdap vaccine during each pregnancy. 1 dose of Td every 10 years.  Varicella vaccine.** / Consult your health care provider. Pregnant females who do not have evidence of immunity should receive the first dose after pregnancy.  Zoster vaccine.** / 1 dose for adults aged 60 years or older.  Measles, mumps, rubella (MMR) vaccine.** / You need at least 1 dose of MMR if you were born in 1957 or later. You may also need a 2nd dose. For females of childbearing age, rubella immunity should be determined. If there is no evidence of immunity, females who are not pregnant should be vaccinated. If there is no evidence of immunity, females who are pregnant should delay immunization until after pregnancy.  Pneumococcal 13-valent conjugate (PCV13) vaccine.** / Consult your health care provider.  Pneumococcal polysaccharide (PPSV23) vaccine.** / 1 to 2 doses if  you smoke cigarettes or if you have certain conditions.  Meningococcal vaccine.** / Consult your health care provider.  Hepatitis A vaccine.** / Consult your health care provider.  Hepatitis B vaccine.** / Consult your health care provider.  Haemophilus influenzae type b (Hib) vaccine.** / Consult your health care provider.  Ages 65 years and over  Blood pressure check.** / Every 1 to 2 years.  Lipid and cholesterol check.** / Every 5 years beginning at age 20 years.  Lung cancer screening. / Every year if you are aged 55 80 years and have a 30-pack-year history of smoking and currently smoke or have quit within the past 15 years. Yearly screening is stopped once you have quit smoking for at least 15 years or develop a health problem that   would prevent you from having lung cancer treatment.  Clinical breast exam.** / Every year after age 103 years.  BRCA-related cancer risk assessment.** / For women who have family members with a BRCA-related cancer (breast, ovarian, tubal, or peritoneal cancers).  Mammogram.** / Every year beginning at age 36 years and continuing for as long as you are in good health. Consult with your health care provider.  Pap test.** / Every 3 years starting at age 5 years through age 85 or 10 years with 3 consecutive normal Pap tests. Testing can be stopped between 65 and 70 years with 3 consecutive normal Pap tests and no abnormal Pap or HPV tests in the past 10 years.  HPV screening.** / Every 3 years from ages 93 years through ages 70 or 45 years with a history of 3 consecutive normal Pap tests. Testing can be stopped between 65 and 70 years with 3 consecutive normal Pap tests and no abnormal Pap or HPV tests in the past 10 years.  Fecal occult blood test (FOBT) of stool. / Every year beginning at age 8 years and continuing until age 45 years. You may not need to do this test if you get a colonoscopy every 10 years.  Flexible sigmoidoscopy or colonoscopy.** /  Every 5 years for a flexible sigmoidoscopy or every 10 years for a colonoscopy beginning at age 69 years and continuing until age 68 years.  Hepatitis C blood test.** / For all people born from [birth year] through 1964 and any individual with known risks for hepatitis C.  Osteoporosis screening.** / A one-time screening for women ages 7 years and over and women at risk for fractures or osteoporosis.  Skin self-exam. / Monthly.  Influenza vaccine. / Every year.  Tetanus, diphtheria, and acellular pertussis (Tdap/Td) vaccine.** / 1 dose of Td every 10 years.  Varicella vaccine.** / Consult your health care provider.  Zoster vaccine.** / 1 dose for adults aged 5 years or older.  Pneumococcal 13-valent conjugate (PCV13) vaccine.** / Consult your health care provider.  Pneumococcal polysaccharide (PPSV23) vaccine.** / 1 dose for all adults aged 74 years and older.  Meningococcal vaccine.** / Consult your health care provider.  Hepatitis A vaccine.** / Consult your health care provider.  Hepatitis B vaccine.** / Consult your health care provider.  Haemophilus influenzae type b (Hib) vaccine.** / Consult your health care provider. ** Family history and personal history of risk and conditions may change your health care provider's recommendations. Document Released: 10/15/2001 Document Revised: 06/09/2013  [facility] Patient Information 2014 [place], [place].   EXERCISE AND DIET:  We recommended that you start or continue a regular exercise program for good health. Regular exercise means any activity that makes your heart beat faster and makes you sweat.  We recommend exercising at least 30 minutes per day at least 3 days a week, preferably 5.  We also recommend a diet low in fat and sugar / carbohydrates.  Inactivity, poor dietary choices and obesity can cause diabetes, heart attack, stroke, and kidney damage, among others.     ALCOHOL AND SMOKING:  Women should limit their alcohol intake to no  more than 7 drinks/beers/glasses of wine (combined, not each!) per week. Moderation of alcohol intake to this level decreases your risk of breast cancer and liver damage.  ( And of course, no recreational drugs are part of a healthy lifestyle.)  Also, you should not be smoking at all or even being exposed to second hand smoke. Most people know smoking can  cause cancer, and various heart and lung diseases, but did you know it also contributes to weakening of your bones?  Aging of your skin?  Yellowing of your teeth and nails?   CALCIUM AND VITAMIN D:  Adequate intake of calcium and Vitamin D are recommended.  The recommendations for exact amounts of these supplements seem to change often, but generally speaking 600 mg of calcium (either carbonate or citrate) and 800 units of Vitamin D per day seems prudent. Certain women may benefit from higher intake of Vitamin D.  If you are among these women, your doctor will have told you during your visit.     PAP SMEARS:  Pap smears, to check for cervical cancer or precancers,  have traditionally been done yearly, although recent scientific advances have shown that most women can have pap smears less often.  However, every woman still should have a physical exam from her gynecologist or primary care physician every year. It will include a breast check, inspection of the vulva and vagina to check for abnormal growths or skin changes, a visual exam of the cervix, and then an exam to evaluate the size and shape of the uterus and ovaries.  And after 56 years of age, a rectal exam is indicated to check for rectal cancers. We will also provide age appropriate advice regarding health maintenance, like when you should have certain vaccines, screening for sexually transmitted diseases, bone density testing, colonoscopy, mammograms, etc.    MAMMOGRAMS:  All women over 71 years old should have a yearly mammogram. Many facilities now offer a "3D" mammogram, which may cost  around $50 extra out of pocket. If possible,  we recommend you accept the option to have the 3D mammogram performed.  It both reduces the number of women who will be called back for extra views which then turn out to be normal, and it is better than the routine mammogram at detecting truly abnormal areas.     COLONOSCOPY:  Colonoscopy to screen for colon cancer is recommended for all women at age 52.  We know, you hate the idea of the prep.  We agree, BUT, having colon cancer and not knowing it is worse!!  Colon cancer so often starts as a polyp that can be seen and removed at colonscopy, which can quite literally save your life!  And if your first colonoscopy is normal and you have no family history of colon cancer, most women don't have to have it again for 10 years.  Once every ten years, you can do something that may end up saving your life, right?  We will be happy to help you get it scheduled when you are ready.  Be sure to check your insurance coverage so you understand how much it will cost.  It may be covered as a preventative service at no cost, but you should check your particular policy.

## 2018-03-04 NOTE — Progress Notes (Signed)
Impression and Recommendations:    1. Encounter for wellness examination   2. Health education/counseling   3. Hormone replacement therapy (HRT)   4. Osteopenia of multiple sites   5. Essential hypertension     1) Anticipatory Guidance: Discussed importance of wearing a seatbelt while driving, not texting while driving; sunscreen when outside along with yearly skin surveillance; eating a well balanced and modest diet; physical activity at least 25 minutes per day or 150 min/ week of moderate to intense activity.  2) Immunizations / Screenings / Labs:  All immunizations and screenings that patient agrees to, are up-to-date per recommendations or will be updated today.  Patient understands the needs for q 34mo dental and yearly vision screens which pt will schedule independently. Obtain CBC, CMP, HgA1c, Lipid panel, TSH and vit D when fasting if not already done recently.   - Patient is in need of a dental referral.  Recommended that she go every 6 months for cleanings.  - Patient plans to schedule her next skin screening.  - Continue to follow up with bone density screening as recommended.  - Strongly encouraged patient to send all information & results from screenings, such as her recent mammogram, for full documentation purposes.  3) Weight:   Discussed goal of losing even 5-10% of current body weight which would improve overall feelings of well being and improve objective health data significantly.   Improve nutrient density of diet through increasing intake of fruits and vegetables and decreasing saturated/trans fats, white flour products and refined sugar products.   4) BMI Counseling Explained to patient what BMI refers to, and what it means medically.    Told patient to think about it as a "medical risk stratification measurement" and how increasing BMI is associated with increasing risk/ or worsening state of various diseases such as hypertension, hyperlipidemia, diabetes,  premature OA, depression etc.  American Heart Association guidelines for healthy diet, basically Mediterranean diet, and exercise guidelines of 30 minutes 5 days per week or more discussed in detail.  Health counseling performed.  All questions answered.  5) Lifestyle & Preventative Health Maintenance - Advised patient to continue working toward exercising to improve health, including mental health and bone health.  - Encouraged continued daily physical activity.  Recommended that the patient eventually strive for at least 150 minutes of moderate cardiovascular activity per week according to guidelines established by the [facility].   - Healthy dietary habits encouraged, including low-carb, and high amounts of lean protein in diet.   - Patient should also consume adequate amounts of water - half of body weight in oz of water per day. - Encouraged increased fiber intake along with plenty of water to aid her digestion. - If constipation is an issue, she may use Miralax every day for gentle relief.  6) Follow-Up - Last blood-work drawn 11/18/2016; patient is due today and fasting in preparation.    - Return in 4-6 months for regularly scheduled chronic follow-up.   No orders of the defined types were placed in this encounter.   Orders Placed This Encounter  Procedures  . CBC with Differential/Platelet  . Comprehensive metabolic panel  . TSH  . VITAMIN D 25 Hydroxy (Vit-D Deficiency, Fractures)  . Hemoglobin A1c  . Lipid panel  . T4, free    Gross side effects, risk and benefits, and alternatives of medications discussed with patient.  Patient is aware that all medications have potential side effects and we are unable to predict every  side effect or drug-drug interaction that may occur.  Expresses verbal understanding and consents to current therapy plan and treatment regimen.  F-up preventative CPE in 1 year. F/up sooner for chronic care management as discussed and/or prn.  Please see  orders placed and AVS handed out to patient at the end of our visit for further patient instructions/ counseling done pertaining to today's office visit.  This document serves as a record of services personally performed by Mellody Dance, DO. It was created on her behalf by [name], a trained medical scribe. The creation of this record is based on the scribe's personal observations and the provider's statements to them.   I have reviewed the above medical documentation for accuracy and completeness and I concur.  Mellody Dance 03/04/18 1:05 PM    Subjective:    Chief Complaint  Patient presents with  . Annual Exam   CC:   HPI: KIMYATA MILICH is a 57 y.o. female who [facility] today a yearly health maintenance exam.  Health Maintenance Summary Reviewed and updated, unless pt declines services.  Colonoscopy:   Last colonoscopy completely normal; conducted in 2014. Tobacco History Reviewed:   Y; never smoker. Alcohol:    No concerns, no excessive use Exercise Habits:   Not meeting AHA guidelines STD concerns:   none Drug Use:   None Birth control method:   n/a Menses regular:     n/a Lumps or breast concerns:      no Breast Cancer Family History:      No Bone Density: Last bone density screening was in December 2017.   Patient notes that she's completely retired.  Works at the [employer]; just picked up a small job there.  Still does her Zumba.  Has been taking turmeric and an injection for her hips; states "it's much better; it's not 100%, but it's much better."  Patient denies any chest pain, SOB, exercise intolerance, or dizziness.  Denies diarrhea, confirms a little constipation.  OBGYN Follows up with Dr. Dellis Filbert; 08/01/2017, negative pap smear, negative high risk HPV. Notes that she had a mammogram recently that was normal, but cannot remember where this was obtained.  Dermatology Follows up with Dr. Ledell Peoples  practice, since her former provider (Dr. Allyson Sabal) retired.  Patient acknowledges that she needs to make her dermatological appointment.  [place] and wearing a lot of sunscreen.  Ophthalmology Last eye exam was a year ago; patient notes that she usually gets them every year and needs to schedule this.  Weight Patient's body mass index is 25.32 kg/m.  She wishes to lose a few more pounds.    Immunization History  Administered Date(s) Administered  . Influenza,inj,Quad PF,6+ Mos 06/18/2017  . Tdap 08/24/2012    Health Maintenance  Topic Date Due  . MAMMOGRAM  01/03/2019 (Originally 07/31/2017)  . Hepatitis C Screening  01/03/2019 (Originally 24-Feb-1961)  . HIV Screening  01/03/2019 (Originally [birth year]1977)  . INFLUENZA VACCINE  04/02/2018  . PAP SMEAR  08/01/2018  . TETANUS/TDAP  08/24/2022  . COLONOSCOPY  10/13/2022     Wt Readings from Last 3 Encounters:  03/04/18 156 lb 14.4 oz (71.2 kg)  02/25/18 154 lb (69.9 kg)  01/29/18 150 lb (68 kg)   BP Readings from Last 3 Encounters:  03/04/18 121/79  02/25/18 124/80  01/29/18 (!) 148/80   Pulse Readings from Last 3 Encounters:  03/04/18 90  02/25/18 76  01/29/18 81     Past Medical History:  Diagnosis Date  . Hypertension   . Osteopenia   . Post-operative nausea and vomiting    vomiting after general anesthesia per pt.      Past Surgical History:  Procedure Laterality Date  . CESAREAN SECTION     x 2  . ENDOMETRIAL ABLATION    . PELVIC LAPAROSCOPY  1999   left salpingectomy, excision of Right, paratubal cyst  . PELVIC LAPAROSCOPY     laparoscopy with lysis of pelvic adhesions  . SHOULDER SURGERY  11/2010   "FROZEN SHOULDER"  . TUBAL LIGATION        Family History  Problem Relation Age of Onset  . Hypertension Mother   . Heart disease Mother   . Diabetes Mother   . Hypertension Father   . Colon cancer Neg Hx       Social History     Substance and Sexual Activity  Drug Use No  ,   Social History   Substance and Sexual Activity  Alcohol Use Yes   Comment: rare  ,   Social History   Tobacco Use  Smoking Status Never Smoker  Smokeless Tobacco Never Used  ,   Social History   Substance and Sexual Activity  Sexual Activity Not Currently  . Partners: Male  . Birth control/protection: Post-menopausal   Comment: 1st intercourse- 19, partners- 5,  married- 58 yrs     Current Outpatient Medications on File Prior to Visit  Medication Sig Dispense Refill  . Calcium Carbonate-Vitamin D (CALTRATE 600+D PO) Take 1 tablet by mouth daily.    Marland Kitchen lisinopril-hydrochlorothiazide (PRINZIDE,ZESTORETIC) 20-12.5 MG tablet TAKE 1 TABLET BY ORAL ROUTE EVERY DAY 30 tablet 3  . meloxicam (MOBIC) 15 MG tablet 0.5-1 tab po qd prn pain 30 tablet 1  . Misc Natural Products (COSAMIN ASU FOR JOINT HEALTH PO) Take 1 capsule by mouth daily.    . Misc Natural Products (TART CHERRY ADVANCED PO) Take 1 capsule by mouth daily.    . TURMERIC PO Take 1 tablet by mouth daily.    . Vitamin D, Ergocalciferol, (DRISDOL) 50000 units CAPS capsule Take 1 capsule (50,000 Units total) by mouth every 7 (seven) days. 12 capsule 0   No current facility-administered medications on file prior to visit.     Allergies: Patient has no known allergies.  Review of Systems: General:   Denies fever, chills, unexplained weight loss.  Optho/Auditory:   Denies visual changes, blurred vision/LOV Respiratory:   Denies SOB, DOE more than baseline levels.  Cardiovascular:   Denies chest pain, palpitations, new onset peripheral edema  Gastrointestinal:   Denies nausea, vomiting, diarrhea.  Genitourinary: Denies dysuria, freq/ urgency, flank pain or discharge from genitals.  Endocrine:     Denies hot or cold intolerance, polyuria, polydipsia. Musculoskeletal:   Denies unexplained myalgias, joint swelling, unexplained arthralgias, gait problems.  Skin:  Denies  rash, suspicious lesions Neurological:     Denies dizziness, unexplained weakness, numbness  Psychiatric/Behavioral:   Denies mood changes, suicidal or homicidal ideations, hallucinations    Objective:    Blood pressure 121/79, pulse 90, height 5\' 6"  (1.676 m), weight 156 lb 14.4 oz (71.2 kg), last menstrual period 05/01/2009, SpO2 98 %. Body mass index is 25.32 kg/m. General Appearance:    Alert, cooperative, no distress, appears stated age  Head:    Normocephalic, without obvious abnormality, atraumatic  Eyes:    PERRL, conjunctiva/corneas clear, EOM's  intact, fundi    benign, both eyes  Ears:    Normal TM's and external ear canals, both ears  Nose:   Nares normal, septum midline, mucosa normal, no drainage    or sinus tenderness  Throat:   Lips w/o lesion, mucosa moist, and tongue normal; teeth and   gums normal  Neck:   Supple, symmetrical, trachea midline, no adenopathy;    thyroid:  no enlargement/tenderness/nodules; no carotid   bruit or JVD  Back:     Symmetric, no curvature, ROM normal, no CVA tenderness  Lungs:     Clear to auscultation bilaterally, respirations unlabored, no       Wh/ R/ R  Chest Wall:    No tenderness or gross deformity; normal excursion   Heart:    Regular rate and rhythm, S1 and S2 normal, no murmur, rub   or gallop  Breast Exam:    Not performed; patient follows up with OBGYN.  Abdomen:     Soft, non-tender, bowel sounds active all four quadrants, NO   G/R/R, no masses, no organomegaly  Genitalia:   Not performed; patient follows up with OBGYN.  Rectal:   Not performed; patient follows up with OBGYN.  Extremities:   Extremities normal, atraumatic, no cyanosis or gross edema  Pulses:   2+ and symmetric all extremities  Skin:   Warm, dry, Skin color, texture, turgor normal, no obvious rashes or lesions Psych: No HI/SI, judgement and insight good, Euthymic mood. Full Affect.  Neurologic:   CNII-XII intact, normal strength, sensation and reflexes     Throughout

## 2018-03-05 LAB — COMPREHENSIVE METABOLIC PANEL
ALBUMIN: 4.3 g/dL (ref 3.5–5.5)
ALT: 13 IU/L (ref 0–32)
AST: 16 IU/L (ref 0–40)
Albumin/Globulin Ratio: 1.8 (ref 1.2–2.2)
Alkaline Phosphatase: 73 IU/L (ref 39–117)
BUN / CREAT RATIO: 17 (ref 9–23)
BUN: 16 mg/dL (ref 6–24)
Bilirubin Total: 0.4 mg/dL (ref 0.0–1.2)
CO2: 26 mmol/L (ref 20–29)
CREATININE: 0.94 mg/dL (ref 0.57–1.00)
Calcium: 10 mg/dL (ref 8.7–10.2)
Chloride: 101 mmol/L (ref 96–106)
GFR calc Af Amer: 79 mL/min/{1.73_m2} (ref >59)
GFR calc non Af Amer: 69 mL/min/{1.73_m2} (ref >59)
GLUCOSE: 95 mg/dL (ref 65–99)
Globulin, Total: 2.4 g/dL (ref 1.5–4.5)
Potassium: 4 mmol/L (ref 3.5–5.2)
Sodium: 140 mmol/L (ref 134–144)
Total Protein: 6.7 g/dL (ref 6.0–8.5)

## 2018-03-05 LAB — LIPID PANEL
CHOLESTEROL TOTAL: 165 mg/dL (ref 100–199)
Chol/HDL Ratio: 2.4 ratio (ref 0.0–4.4)
HDL: 69 mg/dL (ref >39)
LDL Calculated: 88 mg/dL (ref 0–99)
Triglycerides: 41 mg/dL (ref 0–149)
VLDL Cholesterol Cal: 8 mg/dL (ref 5–40)

## 2018-03-05 LAB — CBC WITH DIFFERENTIAL/PLATELET
BASOS ABS: 0 10*3/uL (ref 0.0–0.2)
Basos: 1 %
EOS (ABSOLUTE): 0.2 10*3/uL (ref 0.0–0.4)
Eos: 3 %
HEMOGLOBIN: 13.1 g/dL (ref 11.1–15.9)
Hematocrit: 40.2 % (ref 34.0–46.6)
IMMATURE GRANS (ABS): 0 10*3/uL (ref 0.0–0.1)
Immature Granulocytes: 0 %
LYMPHS ABS: 1.7 10*3/uL (ref 0.7–3.1)
LYMPHS: 28 %
MCH: 27.9 pg (ref 26.6–33.0)
MCHC: 32.6 g/dL (ref 31.5–35.7)
MCV: 86 fL (ref 79–97)
MONOCYTES: 6 %
Monocytes Absolute: 0.4 10*3/uL (ref 0.1–0.9)
Neutrophils Absolute: 3.7 10*3/uL (ref 1.4–7.0)
Neutrophils: 62 %
PLATELETS: 250 10*3/uL (ref 150–450)
RBC: 4.7 x10E6/uL (ref 3.77–5.28)
RDW: 13.7 % (ref 12.3–15.4)
WBC: 6 10*3/uL (ref 3.4–10.8)

## 2018-03-05 LAB — T4, FREE: FREE T4: 1.33 ng/dL (ref 0.82–1.77)

## 2018-03-05 LAB — HEMOGLOBIN A1C
ESTIMATED AVERAGE GLUCOSE: 120 mg/dL
HEMOGLOBIN A1C: 5.8 % — AB (ref 4.8–5.6)

## 2018-03-05 LAB — VITAMIN D 25 HYDROXY (VIT D DEFICIENCY, FRACTURES): VIT D 25 HYDROXY: 79.6 ng/mL (ref 30.0–100.0)

## 2018-03-05 LAB — TSH: TSH: 1.18 u[IU]/mL (ref 0.450–4.500)

## 2018-03-09 ENCOUNTER — Encounter: Payer: Self-pay | Admitting: Family Medicine

## 2018-03-09 DIAGNOSIS — R7303 Prediabetes: Secondary | ICD-10-CM | POA: Insufficient documentation

## 2018-04-08 ENCOUNTER — Ambulatory Visit: Payer: BC Managed Care – PPO | Admitting: Family Medicine

## 2018-04-26 ENCOUNTER — Other Ambulatory Visit: Payer: Self-pay | Admitting: Family Medicine

## 2018-05-16 ENCOUNTER — Other Ambulatory Visit: Payer: Self-pay | Admitting: Family Medicine

## 2018-06-12 ENCOUNTER — Other Ambulatory Visit: Payer: Self-pay

## 2018-06-12 ENCOUNTER — Emergency Department (HOSPITAL_COMMUNITY): Payer: BC Managed Care – PPO

## 2018-06-12 ENCOUNTER — Encounter (HOSPITAL_COMMUNITY): Payer: Self-pay | Admitting: Emergency Medicine

## 2018-06-12 ENCOUNTER — Emergency Department (HOSPITAL_COMMUNITY)
Admission: EM | Admit: 2018-06-12 | Discharge: 2018-06-12 | Disposition: A | Discharge status: Home / Self Care | Payer: BC Managed Care – PPO | Attending: Emergency Medicine | Admitting: Emergency Medicine

## 2018-06-12 DIAGNOSIS — I10 Essential (primary) hypertension: Secondary | ICD-10-CM | POA: Insufficient documentation

## 2018-06-12 DIAGNOSIS — Z79899 Other long term (current) drug therapy: Secondary | ICD-10-CM | POA: Diagnosis not present

## 2018-06-12 DIAGNOSIS — R079 Chest pain, unspecified: Secondary | ICD-10-CM | POA: Diagnosis present

## 2018-06-12 DIAGNOSIS — R0781 Pleurodynia: Secondary | ICD-10-CM | POA: Diagnosis not present

## 2018-06-12 LAB — D-DIMER, QUANTITATIVE: D-Dimer, Quant: 0.87 ug{FEU}/mL — ABNORMAL HIGH (ref 0.00–0.50)

## 2018-06-12 LAB — I-STAT TROPONIN, ED
Troponin i, poc: 0 ng/mL (ref 0.00–0.08)
Troponin i, poc: 0.01 ng/mL (ref 0.00–0.08)

## 2018-06-12 LAB — BASIC METABOLIC PANEL
ANION GAP: 11 (ref 5–15)
BUN: 15 mg/dL (ref 6–20)
CHLORIDE: 102 mmol/L (ref 98–111)
CO2: 25 mmol/L (ref 22–32)
Calcium: 9.2 mg/dL (ref 8.9–10.3)
Creatinine, Ser: 0.95 mg/dL (ref 0.44–1.00)
GFR calc Af Amer: >60 mL/min (ref >60)
Glucose, Bld: 108 mg/dL — ABNORMAL HIGH (ref 70–99)
POTASSIUM: 3.6 mmol/L (ref 3.5–5.1)
SODIUM: 138 mmol/L (ref 135–145)

## 2018-06-12 LAB — CBC
HCT: 43.1 % (ref 36.0–46.0)
HEMOGLOBIN: 13.8 g/dL (ref 12.0–15.0)
MCH: 28 pg (ref 26.0–34.0)
MCHC: 32 g/dL (ref 30.0–36.0)
MCV: 87.6 fL (ref 80.0–100.0)
NRBC: 0 % (ref 0.0–0.2)
Platelets: 259 10*3/uL (ref 150–400)
RBC: 4.92 MIL/uL (ref 3.87–5.11)
RDW: 12.5 % (ref 11.5–15.5)
WBC: 6.7 10*3/uL (ref 4.0–10.5)

## 2018-06-12 MED ORDER — ASPIRIN 81 MG PO CHEW
324.0000 mg | CHEWABLE_TABLET | Freq: Once | ORAL | Status: AC
  Administered 2018-06-12: 324 mg via ORAL
  Filled 2018-06-12: qty 4

## 2018-06-12 MED ORDER — SODIUM CHLORIDE 0.9 % IJ SOLN
INTRAMUSCULAR | Status: AC
  Filled 2018-06-12: qty 50

## 2018-06-12 MED ORDER — IOPAMIDOL (ISOVUE-370) INJECTION 76%
100.0000 mL | Freq: Once | INTRAVENOUS | Status: AC | PRN
  Administered 2018-06-12: 100 mL via INTRAVENOUS

## 2018-06-12 MED ORDER — IOPAMIDOL (ISOVUE-370) INJECTION 76%
INTRAVENOUS | Status: AC
  Filled 2018-06-12: qty 100

## 2018-06-12 MED ORDER — KETOROLAC TROMETHAMINE 30 MG/ML IJ SOLN
30.0000 mg | Freq: Once | INTRAMUSCULAR | Status: AC
  Administered 2018-06-12: 30 mg via INTRAVENOUS
  Filled 2018-06-12: qty 1

## 2018-06-12 NOTE — Discharge Instructions (Addendum)
You were evaluated in the emergency department for left-sided chest pain.  You had blood work EKG chest x-ray and a CAT scan of your chest that did not show an obvious cause of your symptoms.  This is likely muscular and you should use ibuprofen 3 times a day with some food on your stomach.  Please follow-up with your doctor or return if any worsening symptoms.

## 2018-06-12 NOTE — ED Triage Notes (Signed)
Pt c/o chest pain just above her left breast for 12 hours. Pt states pain wraps around to back.

## 2018-06-12 NOTE — ED Provider Notes (Signed)
[facility] DEPT Provider Note   CSN: [id number] Arrival date & time: 06/12/18  0911     History   Chief Complaint Chief Complaint  Patient presents with  . Chest Pain    HPI Cristina Conner is a 57 y.o. female.  She is presenting the emerge department complaining of sharp stabbing left-sided chest pain through to her left scapula that started around 8 PM last night while at rest.  She says she is had some cold symptoms but they are getting better.  The pain is worse with deep breaths and movement of her chest.  She is tried nothing for it.  She does not feel short of breath but she gets the pain when she takes of breath.  No associated nausea vomiting diaphoresis dizziness lightheadedness.  She is never had this pain before.  She said she mowed the lawn yesterday but she does not think she overdid it.  She has a history of hypertension and a family history of heart disease.  No PE risk factors.  The history is provided by the patient.  Chest Pain   This is a new problem. The current episode started yesterday. The problem occurs constantly. The problem has not changed since onset.The pain is associated with raising an arm and breathing. The pain is present in the lateral region. The pain is at a severity of 5/10. The quality of the pain is described as sharp. The pain radiates to the upper back. Duration of episode(s) is 13 hours. The symptoms are aggravated by certain positions and deep breathing. Associated symptoms include cough. Pertinent negatives include no abdominal pain, no diaphoresis, no dizziness, no fever, no headaches, no hemoptysis, no leg pain, no lower extremity edema, no nausea, no near-syncope, no shortness of breath, no sputum production, no syncope and no weakness. She has tried nothing for the symptoms. The treatment provided no relief.  Her past medical history is significant for hypertension.  Her family medical history is significant for  heart disease.    Past Medical History:  Diagnosis Date  . Hypertension   . Osteopenia   . Post-operative nausea and vomiting    vomiting after general anesthesia per pt.    Patient Active Problem List   Diagnosis Date Noted  . Pre-diabetes 03/09/2018  . Postmenopausal state- went thru early 40's 01/02/2018  . Primary osteoarthritis of left hip 01/02/2018  . Caregiver burden- for husband with ALLeukemia 01/02/2018  . Left hip pain 01/02/2018  . Osteopenia 07/31/2016  . Hormone replacement therapy (HRT) 07/31/2016  . Hypertension 05/20/2011  . Menopausal state 05/20/2011    Past Surgical History:  Procedure Laterality Date  . CESAREAN SECTION     x 2  . ENDOMETRIAL ABLATION    . PELVIC LAPAROSCOPY  1999   left salpingectomy, excision of Right, paratubal cyst  . PELVIC LAPAROSCOPY     laparoscopy with lysis of pelvic adhesions  . SHOULDER SURGERY  11/2010   "FROZEN SHOULDER"  . TUBAL LIGATION       OB History    Gravida  2   Para  2   Term      Preterm      AB      Living  2     SAB      TAB      Ectopic      Multiple      Live Births  Home Medications    Prior to Admission medications   Medication Sig Start Date End Date Taking? Authorizing Provider  Calcium Carbonate-Vitamin D (CALTRATE 600+D PO) Take 1 tablet by mouth daily.    [provider]  lisinopril-hydrochlorothiazide (PRINZIDE,ZESTORETIC) 20-12.5 MG tablet TAKE 1 TABLET BY MOUTH EVERY DAY 05/18/18   Mellody Dance, DO  meloxicam (MOBIC) 15 MG tablet 0.5-1 tab po qd prn pain 01/02/18   Opalski, Deborah, DO  Misc Natural Products (COSAMIN ASU FOR JOINT HEALTH PO) Take 1 capsule by mouth daily.    [provider]  Misc Natural Products (TART CHERRY ADVANCED PO) Take 1 capsule by mouth daily.    [provider]  TURMERIC PO Take 1 tablet by mouth daily.    [provider]  Vitamin D, Ergocalciferol, (DRISDOL) 50000 units CAPS capsule TAKE  1 CAPSULE (50,000 UNITS TOTAL) BY MOUTH EVERY 7 (SEVEN) DAYS. 04/27/18   Lyndal Pulley, DO    Family History Family History  Problem Relation Age of Onset  . Hypertension Mother   . Heart disease Mother   . Diabetes Mother   . Hypertension Father   . Colon cancer Neg Hx     Social History Social History   Tobacco Use  . Smoking status: Never Smoker  . Smokeless tobacco: Never Used  Substance Use Topics  . Alcohol use: Yes    Comment: rare  . Drug use: No     Allergies   Patient has no known allergies.   Review of Systems Review of Systems  Constitutional: Negative for diaphoresis and fever.  HENT: Negative for sore throat.   Eyes: Negative for visual disturbance.  Respiratory: Positive for cough. Negative for hemoptysis, sputum production and shortness of breath.   Cardiovascular: Positive for chest pain. Negative for syncope and near-syncope.  Gastrointestinal: Negative for abdominal pain and nausea.  Genitourinary: Negative for dysuria.  Musculoskeletal: Negative for gait problem.  Skin: Negative for rash.  Neurological: Negative for dizziness, weakness and headaches.     Physical Exam Updated Vital Signs BP (!) 165/106 (BP Location: Right Arm)   Pulse (!) 107   Temp 98.1 F (36.7 C) (Oral)   Resp 16   Ht 5\' 6"  (1.676 m)   Wt 71 kg   LMP 05/01/2009   SpO2 100%   BMI 25.26 kg/m   Physical Exam  Constitutional: She appears well-developed and well-nourished. No distress.  HENT:  Head: Normocephalic and atraumatic.  Eyes: Conjunctivae are normal.  Neck: Neck supple.  Cardiovascular: Regular rhythm. Tachycardia present.  No murmur heard. Pulmonary/Chest: Effort normal and breath sounds normal. No respiratory distress.  Abdominal: Soft. There is no tenderness.  Musculoskeletal: Normal range of motion. She exhibits no edema.       Right lower leg: Normal. She exhibits no tenderness and no edema.       Left lower leg: Normal. She exhibits no  tenderness and no edema.  Neurological: She is alert.  Skin: Skin is warm and dry.  Psychiatric: She has a normal mood and affect.  Nursing note and vitals reviewed.    ED Treatments / Results  Labs (all labs ordered are listed, but only abnormal results are displayed) Labs Reviewed  BASIC METABOLIC PANEL - Abnormal; Notable for the following components:      Result Value   Glucose, Bld 108 (*)    All other components within normal limits  D-DIMER, QUANTITATIVE (NOT AT [facility]) - Abnormal; Notable for the following components:   D-Dimer,  Quant 0.87 (*)    All other components within normal limits  CBC  I-STAT TROPONIN, ED  I-STAT TROPONIN, ED    EKG EKG Interpretation  Date/Time:  Friday June 12 2018 09:23:03 EDT Ventricular Rate:  108 PR Interval:    QRS Duration: 70 QT Interval:  325 QTC Calculation: 436 R Axis:   88 Text Interpretation:  Sinus tachycardia increased rate from prior similar otherwise to prior 3/12 Confirmed by Aletta Edouard [phone]) on 06/12/2018 9:26:27 AM   Radiology Ct Angio Chest Pe W/cm &/or Wo Cm  Result Date: 06/12/2018 CLINICAL DATA:  Chest pain above left breast for 12 hours. Positive D-dimers. EXAM: CT ANGIOGRAPHY CHEST WITH CONTRAST TECHNIQUE: Multidetector CT imaging of the chest was performed using the standard protocol during bolus administration of intravenous contrast. Multiplanar CT image reconstructions and MIPs were obtained to evaluate the vascular anatomy. CONTRAST:  172mL ISOVUE-370 IOPAMIDOL (ISOVUE-370) INJECTION 76% COMPARISON:  Chest x-ray June 12, 2018 FINDINGS: Cardiovascular: Satisfactory opacification of the pulmonary arteries to the segmental level. No evidence of pulmonary embolism. Normal heart size. No pericardial effusion. Mediastinum/Nodes: No enlarged mediastinal, hilar, or axillary lymph nodes. Thyroid gland, trachea, and esophagus demonstrate no significant findings. Lungs/Pleura: Lungs are clear. No pleural  effusion or pneumothorax. Upper Abdomen: No acute abnormality. Musculoskeletal: No chest wall abnormality. No acute or significant osseous findings. Review of the MIP images confirms the above findings. IMPRESSION: No pulmonary embolus. No acute abnormality identified in the chest. Electronically Signed   By: Abelardo Diesel M.D.   On: 06/12/2018 12:12   Dg Chest Port 1 View  Result Date: 06/12/2018 CLINICAL DATA:  Chest pain EXAM: PORTABLE CHEST 1 VIEW COMPARISON:  None. FINDINGS: Lungs are clear. Heart size and pulmonary vascularity are normal. No adenopathy. No pneumothorax. No bone lesions. IMPRESSION: No edema or consolidation. Electronically Signed   By: Lowella Grip III M.D.   On: 06/12/2018 10:15    Procedures Procedures (including critical care time)  Medications Ordered in ED Medications  aspirin chewable tablet 324 mg (324 mg Oral Given 06/12/18 0933)  iopamidol (ISOVUE-370) 76 % injection 100 mL (100 mLs Intravenous Contrast Given 06/12/18 1151)  ketorolac (TORADOL) 30 MG/ML injection 30 mg (30 mg Intravenous Given 06/12/18 1251)     Initial Impression / Assessment and Plan / ED Course  I have reviewed the triage vital signs and the nursing notes.  Pertinent labs & imaging results that were available during my care of the patient were reviewed by me and considered in my medical decision making (see chart for details).  Clinical Course as of Jun 14 1055  Fri Jun 12, 3044  342 56 year old with no prior cardiac history here with sharp left-sided chest pain radiating through to her scapula.  Her EKG shows a sinus tachycardia but no acute ST-T changes.  No PE risk factors.  Pulse ox 100%.  Blood pressure mildly elevated.  She is getting aspirin and standard work-up including d-dimer.  Differential includes ACS, PE, pleurisy, pneumothorax, musculoskeletal, aortic dissection.   [MB]  0109 Unfortunately patient's d-dimer is slightly elevated and so I put her in for a chest CT.    [MB]  1216 Patient CT PE negative.  Will check a second troponin and if negative likely discharge.   [MB]    Clinical Course User Index [MB] Hayden Rasmussen, MD      Final Clinical Impressions(s) / ED Diagnoses   Final diagnoses:  Pleuritic chest pain    ED Discharge Orders  None       Hayden Rasmussen, MD 06/13/18 1057

## 2018-07-15 ENCOUNTER — Encounter: Payer: Self-pay | Admitting: Family Medicine

## 2018-07-15 DIAGNOSIS — M1612 Unilateral primary osteoarthritis, left hip: Secondary | ICD-10-CM

## 2018-07-15 MED ORDER — MELOXICAM 15 MG PO TABS: ORAL_TABLET | ORAL | 1 refills | Status: DC

## 2018-07-19 ENCOUNTER — Other Ambulatory Visit: Payer: Self-pay | Admitting: Family Medicine

## 2018-07-24 ENCOUNTER — Other Ambulatory Visit: Payer: Self-pay | Admitting: Nurse Practitioner

## 2018-08-06 ENCOUNTER — Encounter: Payer: BC Managed Care – PPO | Admitting: Obstetrics & Gynecology

## 2018-08-07 ENCOUNTER — Encounter: Payer: Self-pay | Admitting: Family Medicine

## 2018-08-10 LAB — HM MAMMOGRAPHY

## 2018-08-10 NOTE — Progress Notes (Signed)
[name] [address], [place] [address] Phone: [phone] Subjective:   Fontaine No, am serving as a scribe for Dr. Hulan Saas.  CC: Hip pain follow-up  [id number]  Cristina Conner is a 57 y.o. female coming in with complaint of left hip pain. Pain has increased in the past month. Pain is intermittent. Some days worse than others. Pain usually is worse the day after physcial activity. Is using Mobic and turmeric. States that is having some mild discomfort more with even radiation down the leg with light activity such as walking greater than 200 feet.    Past Medical History:  Diagnosis Date  . Hypertension   . Osteopenia   . Post-operative nausea and vomiting    vomiting after general anesthesia per pt.   Past Surgical History:  Procedure Laterality Date  . CESAREAN SECTION     x 2  . ENDOMETRIAL ABLATION    . PELVIC LAPAROSCOPY  1999   left salpingectomy, excision of Right, paratubal cyst  . PELVIC LAPAROSCOPY     laparoscopy with lysis of pelvic adhesions  . SHOULDER SURGERY  11/2010   "FROZEN SHOULDER"  . TUBAL LIGATION     Social History   Socioeconomic History  . Marital status: Married    Spouse name: Not on file  . Number of children: Not on file  . Years of education: Not on file  . Highest education level: Not on file  Occupational History  . Not on file  Social Needs  . Financial resource strain: Not on file  . Food insecurity:    Worry: Not on file    Inability: Not on file  . Transportation needs:    Medical: Not on file    Non-medical: Not on file  Tobacco Use  . Smoking status: Never Smoker  . Smokeless tobacco: Never Used  Substance and Sexual Activity  . Alcohol use: Yes    Comment: rare  . Drug use: No  . Sexual activity: Not Currently    Partners: Male    Birth control/protection: Post-menopausal    Comment: 1st intercourse- 48, partners- 50,  married- 31 yrs   Lifestyle  . Physical  activity:    Days per week: Not on file    Minutes per session: Not on file  . Stress: Not on file  Relationships  . Social connections:    Talks on phone: Not on file    Gets together: Not on file    Attends religious service: Not on file    Active member of club or organization: Not on file    Attends meetings of clubs or organizations: Not on file    Relationship status: Not on file  Other Topics Concern  . Not on file  Social History Narrative  . Not on file   No Known Allergies Family History  Problem Relation Age of Onset  . Hypertension Mother   . Heart disease Mother   . Diabetes Mother   . Hypertension Father   . Colon cancer Neg Hx      Current Outpatient Medications (Cardiovascular):  .  lisinopril-hydrochlorothiazide (PRINZIDE,ZESTORETIC) 20-12.5 MG tablet, TAKE 1 TABLET BY MOUTH EVERY DAY   Current Outpatient Medications (Analgesics):  .  meloxicam (MOBIC) 15 MG tablet, 0.5-1 tab po qd prn pain   Current Outpatient Medications (Other):  .  calcium citrate (CALCITRATE - DOSED IN MG ELEMENTAL CALCIUM) 950 MG tablet, Take 200 mg of elemental calcium  by mouth daily. .  cholecalciferol (VITAMIN D3) 25 MCG (1000 UT) tablet, Take 1,000 Units by mouth daily. .  Misc Natural Products (TART CHERRY ADVANCED PO), Take 1 capsule by mouth daily. .  TURMERIC PO, Take 1 tablet by mouth daily.    Past medical history, social, surgical and family history all reviewed in electronic medical record.  No pertanent information unless stated regarding to the chief complaint.   Review of Systems:  No headache, visual changes, nausea, vomiting, diarrhea, constipation, dizziness, abdominal pain, skin rash, fevers, chills, night sweats, weight loss, swollen lymph nodes, body aches, joint swelling,  chest pain, shortness of breath, mood changes.  Positive muscle aches  Objective  Blood pressure 102/80, pulse 83, height 5\' 6"  (1.676 m), weight 150 lb (68 kg), last menstrual period  05/01/2009, SpO2 98 %.    General: No apparent distress alert and oriented x3 mood and affect normal, dressed appropriately.  HEENT: Pupils equal, extraocular movements intact  Respiratory: Patient's speak in full sentences and does not appear short of breath  Cardiovascular: No lower extremity edema, non tender, no erythema  Skin: Warm dry intact with no signs of infection or rash on extremities or on axial skeleton.  Abdomen: Soft nontender  Neuro: Cranial nerves II through XII are intact, neurovascularly intact in all extremities with 2+ DTRs and 2+ pulses.  Lymph: No lymphadenopathy of posterior or anterior cervical chain or axillae bilaterally.  Gait antalgic MSK:  Non tender with full range of motion and good stability and symmetric strength and tone of shoulders, elbows, wrist, , knee and ankles bilaterally.  Hip exam shows significant amount of decreased range of motion in all planes.  Left hip exam shows decreased range of motion in all planes.  Patient has negative straight leg test.  Severe tenderness in the groin area on the left side.  Procedure: Real-time Ultrasound Guided Injection of left intra-articular hip Device: GE Logiq Q7  Ultrasound guided injection is preferred based studies that show increased duration, increased effect, greater accuracy, decreased procedural pain, increased response rate with ultrasound guided versus blind injection.  Verbal informed consent obtained.  Time-out conducted.  Noted no overlying erythema, induration, or other signs of local infection.  Skin prepped in a sterile fashion.  Local anesthesia: Topical Ethyl chloride.  With sterile technique and under real time ultrasound guidance:  Anterior capsule visualized, needle visualized going to the head neck junction at the anterior capsule. Pictures taken. Patient did have injection of 3 cc of 0.5% Marcaine, and 1 cc of Kenalog 40 mg/dL. Completed without difficulty  Pain immediately resolved  suggesting accurate placement of the medication.  Advised to call if fevers/chills, erythema, induration, drainage, or persistent bleeding.  Images permanently stored and available for review in the ultrasound unit.  Impression: Technically successful ultrasound guided injection.   Impression and Recommendations:     This case required medical decision making of moderate complexity. The above documentation has been reviewed and is accurate and complete Lyndal Pulley, DO       Note: This dictation was prepared with Dragon dictation along with smaller phrase technology. Any transcriptional errors that result from this process are unintentional.

## 2018-08-11 ENCOUNTER — Ambulatory Visit: Payer: BC Managed Care – PPO | Admitting: Family Medicine

## 2018-08-11 ENCOUNTER — Encounter: Payer: Self-pay | Admitting: Family Medicine

## 2018-08-11 ENCOUNTER — Ambulatory Visit: Payer: Self-pay

## 2018-08-11 VITALS — BP 102/80 | HR 83 | Ht 66.0 in | Wt 150.0 lb

## 2018-08-11 DIAGNOSIS — M25552 Pain in left hip: Secondary | ICD-10-CM | POA: Diagnosis not present

## 2018-08-11 DIAGNOSIS — M1612 Unilateral primary osteoarthritis, left hip: Secondary | ICD-10-CM | POA: Diagnosis not present

## 2018-08-11 NOTE — Patient Instructions (Signed)
Injected the hip again  You know the drill  Need to last at least 3 months  See me again ain 3 months Happy holidays!

## 2018-08-11 NOTE — Assessment & Plan Note (Signed)
Repeat injection given again today.  Mated 6 months.  Patient encouraged to consider the possibility of a hip replacement in the near future.  Patient is going to be going on a trip in April and would like to do it after that.  We discussed continuing the home exercise, icing regimen, which activities to do which wants to avoid.  Patient will follow-up with me again 12 weeks

## 2018-09-09 ENCOUNTER — Encounter: Payer: Self-pay | Admitting: Family Medicine

## 2018-09-09 ENCOUNTER — Ambulatory Visit: Payer: BC Managed Care – PPO | Admitting: Family Medicine

## 2018-09-09 VITALS — BP 123/82 | HR 100 | Temp 98.3°F | Ht 66.0 in | Wt 153.6 lb

## 2018-09-09 DIAGNOSIS — R7303 Prediabetes: Secondary | ICD-10-CM | POA: Diagnosis not present

## 2018-09-09 DIAGNOSIS — M25552 Pain in left hip: Secondary | ICD-10-CM

## 2018-09-09 DIAGNOSIS — M1612 Unilateral primary osteoarthritis, left hip: Secondary | ICD-10-CM

## 2018-09-09 DIAGNOSIS — I10 Essential (primary) hypertension: Secondary | ICD-10-CM | POA: Diagnosis not present

## 2018-09-09 DIAGNOSIS — G479 Sleep disorder, unspecified: Secondary | ICD-10-CM | POA: Insufficient documentation

## 2018-09-09 DIAGNOSIS — Z636 Dependent relative needing care at home: Secondary | ICD-10-CM | POA: Diagnosis not present

## 2018-09-09 DIAGNOSIS — M8589 Other specified disorders of bone density and structure, multiple sites: Secondary | ICD-10-CM

## 2018-09-09 LAB — POCT GLYCOSYLATED HEMOGLOBIN (HGB A1C): Hemoglobin A1C: 5.5 % (ref 4.0–5.6)

## 2018-09-09 NOTE — Patient Instructions (Addendum)
Tayli as we discussed take half a tab of your 50 mg trazodone that you have at home.  If this works well for your sleep, please call and let us know that you have started this so we can add it onto your med list.  Your vitamin D level was superb back in July when we checked it at 28.  There is no need to recheck this today.  We will check your A1c today and you can wait for those results.  -As we discussed try to increase your exercise to a daily routine of at least 45 minutes or more  Please talk to your pastor about possible counseling as well.  And also look into support groups /caregiver support groups of patients with leukemia etc.      Behavioral Health/ Counseling Referrals    Anderson Malta, personal counselor in [place], specializing in marriage counseling    Dr. Tomi Bamberger, PHD Dr. Tomi Bamberger, PHD is a counselor in [place], [place].  [address] [place], [place] [address] [place] [phone]   Kristie Cowman, [place]  [phone] [email or web address] ( she does [place] and faith-based coaching and counseling )    [employer]- ( faith-based counseling ) Address: [place]. [place], [place] [address] [phone] [address] for appointments [phone] Fax Hours: Monday - Thursday 8:00am-6:00pm Closed for lunch 12-1Thursday only Friday: Closed all day   Steffanie Rainwater: [phone] or Meg [phone] -counselors in [place] who are faith based   -Also Ms. Marya Fossa - [place] behavioral medicine. Darrick Meigs based counseling.    [facility] Nunzio Cobbs, LCSW, ACSW, M.ED.  -Nunzio Cobbs is a licensed clinical social worker in practice over 35 years and with Dr. Chucky May for the last 10 years.  -She sees adults, adolescents, children & families and couples. -Services are provided for mood and anxiety disorders, marital  issues, family or parent/child problems, parenting, co-dependency, gender issues, trauma, grief, and stages of life issues. She also provides critical incident stress debriefing.  -Pamala Hurry accepts many employee assistance programs (EAP), [employer], Pharmacist, hospital.  PHONE  [phone]                FAX [phone]   Rodena Goldmann -[email or web address] [employer]- gen counseling;  PHD   Wilber Oliphant, MSW [address] [place]   [place] Apolonio Schneiders, PhD [address] [facility] [phone]   [facility]- children [address] [place], [place]    [facility] various counselors [place], [place] [phone] ( one patient saw Gayland Curry and really liked her)    Lanelle Bal Professional Counselor Counseling and [employer] [phone]   [place] abuse [facility] Taylor-Clinical Manager [address] [place] [phone] [place] [address] [place] Delmer Islam, PhD **   -adult, adolescent, and child Oneida Arenas, PhD Leitha Bleak, LCSW Jillene Bucks, PhD-child, adolescent and adults   [facility] [address], [place] [phone] Merilyn Baba, MS-child, adolescent and adults Lennart Pall, PhD-adolescent and adults   [facility]-grief, terminal illness [place], [place] [address], [employer] [facility] [employer] [address], [place]   [place]  [address] [place] [phone]   [facility] [address] [place],  [place] [phone]   [facility] [address] [place] [phone]   [facility] [address], [place]   [facility] [address] [place], [place]

## 2018-09-09 NOTE — Progress Notes (Signed)
Impression and Recommendations:    1. Pre-diabetes   2. Essential hypertension   3. Caregiver burden- for husband with ALLeukemia   4. Sleep difficulties   5. Left hip pain   6. Primary osteoarthritis of left hip   7. Osteopenia of multiple sites     1. Essential Hypertension - Blood pressure remains controlled at this time. - Continue treatment plan as prescribed.  See med list below. - Patient tolerating meds well without complication.  Denies S-E  - Reviewed goal blood pressure as under 130/80.  - Lifestyle changes such as dash diet and engaging in a regular exercise program discussed with patient.  - Ongoing ambulatory BP monitoring encouraged. Keep log and bring in next OV.  2. Pre-diabetes - A1c checked today.  Patient working hard on her physical activity and overall well-being, and wants to keep an eye on her A1c.  - Counseled patient on prevention of disease and discussed dietary and lifestyle modifications as first line.  Importance of low carb/ketogenic diet discussed with patient in addition to regular exercise.   3. Acute Caregiver Stress - Educated patient extensively about caregiver stress.  All questions were answered.  - Encouraged patient to seek assistance through counseling, including counseling through her pastor at church.  - Discussed that patient may also visit caregiver support groups PRN.  - Reviewed the "spokes of the wheel" of mood and health management.  Stressed the importance of ongoing prudent habits, including regular exercise, appropriate sleep hygiene, healthful dietary habits, and prayer/meditation to relax.  4. Sleep Difficulties - Patient knows to let us know if she resumes use of trazodone. - Advised patient to call in to the clinic to update her med list regarding her trazodone prescription. - Discussed that if she is experiencing symptoms of grogginess, her ideal dose should be 1/2 of a 50 mg tablet.  5. Osteoarthritis -  Chronic Left Hip Pain - Continue management through specialist as prescribed. - Symptoms remain well-managed at this time. - Will continue to monitor.  6. BMI Counseling Explained to patient what BMI refers to, and what it means medically.    Told patient to think about it as a "medical risk stratification measurement" and how increasing BMI is associated with increasing risk/ or worsening state of various diseases such as hypertension, hyperlipidemia, diabetes, premature OA, depression etc.  American Heart Association guidelines for healthy diet, basically Mediterranean diet, and exercise guidelines of 30 minutes 5 days per week or more discussed in detail.  Health counseling performed.  All questions answered.  7. Lifestyle & Preventative Health Maintenance - Advised patient to continue working toward exercising to improve overall mental, physical, and emotional health.    - Encouraged patient to continue engaging in daily physical activity, especially a formal exercise routine.  Recommended that the patient eventually strive for at least 150 minutes of moderate cardiovascular activity per week according to guidelines established by the [facility]   - Healthy dietary habits encouraged, including low-carb, and high amounts of lean protein in diet.   - Patient should also consume adequate amounts of water.   Education and routine counseling performed. Handouts provided.    Orders Placed This Encounter  Procedures  . POCT glycosylated hemoglobin (Hb A1C)    No orders of the defined types were placed in this encounter.   There are no discontinued medications.   The patient was counseled, risk factors were discussed, anticipatory guidance given.  Gross side effects, risk and benefits, and  alternatives of medications discussed with patient.  Patient is aware that all medications have potential side effects and we are unable to predict every side effect or drug-drug interaction that may  occur.  Expresses verbal understanding and consents to current therapy plan and treatment regimen.  Return for 43mo- stress check up- .  Please see AVS handed out to patient at the end of our visit for further patient instructions/ counseling done pertaining to today's office visit.    Note:  This document was prepared using Dragon voice recognition software and may include unintentional dictation errors.   This document serves as a record of services personally performed by Mellody Dance, DO. It was created on her behalf by [name], a trained medical scribe. The creation of this record is based on the scribe's personal observations and the provider's statements to them.   I have reviewed the above medical documentation for accuracy and completeness and I concur.  Mellody Dance, DO 09/09/2018 9:37 PM       Subjective:    HPI: Cristina Conner is a 58 y.o. female who presents to [facility] today for follow up of [place].    Hospital Visit Three Months Ago Had a bad cold with a cough, experienced chest pain, and drove herself to the hospital because she was worried she was having a heart attack.  Notes she went to [facility] about three months ago, "when it started getting cool."  They reassured her that they couldn't find anything wrong with her heart.  Exercise Habits She continues exercising, doing Zumba as tolerated by her hip.  Exercises four days per week, at least an hour each time.  "I'm a group exercise person."  She has also begun putting "body pump" weight lifting into her exercise routine.  "It's a group exercise with music."  Chronic Hip Pain States that her hip bothers her the most.   She continues taking meloxicam, every two or three days.  States that Dr. Tamala Julian told her she will need to have hip surgery.  She is planning to look into this around November.  However, notes she has had two cortisone shots in her hip in  the past, to significant relief.  The relief from the injections lasts about five months.  "I always have the pain, some days worse than others.  But it's bearable."  Vitamin D Supplementation States "I don't feel tired."  She takes a Caltrate and a Vitamin D, 1000 IU's daily.  Notes that she does not take a Calcium pill.  Life Stressors Her husband has had leukemia twice; she takes him to [facility] two to three times per week.  Patent is also currently dealing with her mom who will be 86 soon.  Patient states she is currently caregiver for both her husband and her mother.  While she has a brother, she has some issues with him and his alcoholism.  Says "sometimes I feel like I don't have anybody to turn to."  Insomnia due to Acute Stress Says "I'm not sleeping as well at night."  At night she has racing thoughts about everything that's going on in her life.  She doesn't have trouble falling asleep, but wakes up in the middle of the night thinking about her mom, husband, and brother.    Has a prescription for trazodone and "has tons of them at the house."  Notes "I'm not a pill person."  HTN:  -  Her blood  pressure has been controlled at home.  Pt has been checking it regularly.  Notes she was rushing to get here today, which may have elevated her blood pressure.  Otherwise notes that her blood pressure at home is good, running around 120/72.  Confirms that BP stays in the 120's/70's in general.  - Patient reports good compliance with blood pressure medications.  She continues taking her medications every day.  - Denies medication S-E   - Smoking Status noted   - She denies new onset of: chest pain, exercise intolerance, shortness of breath, dizziness, visual changes, headache, lower extremity swelling or claudication.   Last 3 blood pressure readings in our office are as follows: BP Readings from Last 3 Encounters:  09/09/18 123/82  08/11/18 102/80  06/12/18 133/87    Pulse Readings  from Last 3 Encounters:  09/09/18 100  08/11/18 83  06/12/18 94    Filed Weights   09/09/18 0855  Weight: 153 lb 9.6 oz (69.7 kg)      Patient Care Team    Relationship Specialty Notifications Start End  Mellody Dance, DO PCP - General Family Medicine  01/02/18   Druscilla Brownie, MD Consulting Physician Dermatology  01/02/18   Opthamology, [facility]  Ophthalmology  01/02/18   Princess Bruins, MD Consulting Physician Obstetrics and Gynecology  03/04/18      Lab Results  Component Value Date   CREATININE 0.95 06/12/2018   BUN 15 06/12/2018   NA 138 06/12/2018   K 3.6 06/12/2018   CL 102 06/12/2018   CO2 25 06/12/2018    Lab Results  Component Value Date   CHOL 165 03/04/2018    Lab Results  Component Value Date   HDL 69 03/04/2018    Lab Results  Component Value Date   LDLCALC 88 03/04/2018    Lab Results  Component Value Date   TRIG 41 03/04/2018    Lab Results  Component Value Date   CHOLHDL 2.4 03/04/2018    No results found for: LDLDIRECT ===================================================================   Patient Active Problem List   Diagnosis Date Noted  . Pre-diabetes 03/09/2018    Priority: High  . Hypertension 05/20/2011    Priority: High  . Osteopenia 07/31/2016    Priority: Medium  . Postmenopausal state- went thru early 40's 01/02/2018    Priority: Low  . Sleep difficulties 09/09/2018  . Primary osteoarthritis of left hip 01/02/2018  . Caregiver burden- for husband with ALLeukemia 01/02/2018  . Left hip pain 01/02/2018  . Hormone replacement therapy (HRT) 07/31/2016  . Menopausal state 05/20/2011     Past Medical History:  Diagnosis Date  . Hypertension   . Osteopenia   . Post-operative nausea and vomiting    vomiting after general anesthesia per pt.     Past Surgical History:  Procedure Laterality Date  . CESAREAN SECTION     x 2  . ENDOMETRIAL ABLATION    . PELVIC LAPAROSCOPY  1999   left salpingectomy,  excision of Right, paratubal cyst  . PELVIC LAPAROSCOPY     laparoscopy with lysis of pelvic adhesions  . SHOULDER SURGERY  11/2010   "FROZEN SHOULDER"  . TUBAL LIGATION       Family History  Problem Relation Age of Onset  . Hypertension Mother   . Heart disease Mother   . Diabetes Mother   . Hypertension Father   . Colon cancer Neg Hx      Social History   Substance and Sexual Activity  Drug Use  No  ,  Social History   Substance and Sexual Activity  Alcohol Use Yes   Comment: rare  ,  Social History   Tobacco Use  Smoking Status Never Smoker  Smokeless Tobacco Never Used  ,    Current Outpatient Medications on File Prior to Visit  Medication Sig Dispense Refill  . calcium citrate (CALCITRATE - DOSED IN MG ELEMENTAL CALCIUM) 950 MG tablet Take 200 mg of elemental calcium by mouth daily.    . cholecalciferol (VITAMIN D3) 25 MCG (1000 UT) tablet Take 1,000 Units by mouth daily.    Marland Kitchen lisinopril-hydrochlorothiazide (PRINZIDE,ZESTORETIC) 20-12.5 MG tablet TAKE 1 TABLET BY MOUTH EVERY DAY 90 tablet 1  . meloxicam (MOBIC) 15 MG tablet 0.5-1 tab po qd prn pain 90 tablet 1  . Misc Natural Products (TART CHERRY ADVANCED PO) Take 1 capsule by mouth daily.    . TURMERIC PO Take 1 tablet by mouth daily.     No current facility-administered medications on file prior to visit.      No Known Allergies   Review of Systems:   General:  Denies fever, chills Optho/Auditory:   Denies visual changes, blurred vision Respiratory:   Denies SOB, cough, wheeze, DIB  Cardiovascular:   Denies chest pain, palpitations, painful respirations Gastrointestinal:   Denies nausea, vomiting, diarrhea.  Endocrine:     Denies new hot or cold intolerance Musculoskeletal:  Denies joint swelling, gait issues, or new unexplained myalgias/ arthralgias Skin:  Denies rash, suspicious lesions  Neurological:    Denies dizziness, unexplained weakness, numbness  Psychiatric/Behavioral:   Denies mood  changes  Objective:    Blood pressure 123/82, pulse 100, temperature 98.3 F (36.8 C), height 5\' 6"  (1.676 m), weight 153 lb 9.6 oz (69.7 kg), last menstrual period 05/01/2009, SpO2 100 %.  Body mass index is 24.79 kg/m.  General: Well Developed, well nourished, and in no acute distress.  HEENT: Normocephalic, atraumatic, pupils equal round reactive to light, neck supple, No carotid bruits, no JVD Skin: Warm and dry, cap RF less 2 sec Cardiac: Regular rate and rhythm, S1, S2 WNL's, no murmurs rubs or gallops Respiratory: ECTA B/L, Not using accessory muscles, speaking in full sentences. NeuroM-Sk: Ambulates w/o assistance, moves ext * 4 w/o difficulty, sensation grossly intact.  Ext: scant edema b/l lower ext Psych: No HI/SI, judgement and insight good, Euthymic mood. Full Affect.

## 2018-10-30 ENCOUNTER — Encounter: Payer: BC Managed Care – PPO | Admitting: Obstetrics & Gynecology

## 2018-11-11 ENCOUNTER — Ambulatory Visit: Payer: BC Managed Care – PPO | Admitting: Family Medicine

## 2018-11-13 ENCOUNTER — Other Ambulatory Visit: Payer: Self-pay | Admitting: Family Medicine

## 2018-11-17 ENCOUNTER — Encounter: Payer: Self-pay | Admitting: Obstetrics & Gynecology

## 2018-11-17 ENCOUNTER — Ambulatory Visit: Payer: BC Managed Care – PPO | Admitting: Obstetrics & Gynecology

## 2018-11-17 ENCOUNTER — Other Ambulatory Visit: Payer: Self-pay

## 2018-11-17 VITALS — BP 124/80 | Ht 66.0 in | Wt 155.0 lb

## 2018-11-17 DIAGNOSIS — Z01419 Encounter for gynecological examination (general) (routine) without abnormal findings: Secondary | ICD-10-CM

## 2018-11-17 DIAGNOSIS — M8589 Other specified disorders of bone density and structure, multiple sites: Secondary | ICD-10-CM | POA: Diagnosis not present

## 2018-11-17 DIAGNOSIS — Z780 Asymptomatic menopausal state: Secondary | ICD-10-CM

## 2018-11-17 NOTE — Progress Notes (Signed)
Cristina Conner [facility] 03-07-1961 [id number]   History:    58 y.o. G2P2L2  Married  RP:  Established patient presenting for annual gyn exam   HPI: Postmenopausal, well on no HRT.  No PMB.  No pelvic pain.  Dryness and pain with IC.  Urine/BMs wnl.  Breasts wnl.  BMI 25.02.  Good fitness.  Healthy nutrition.  Health labs with Fam MD.  Colono normal 2014.  Past medical history,surgical history, family history and social history were all reviewed and documented in the EPIC chart.  Gynecologic History Patient's last menstrual period was 05/01/2009. Contraception: post menopausal status Last Pap: 07/2017. Results were: Negative, HPV HR neg Last mammogram: 08/2018. Results were: Negative Bone Density: 08/2016 Osteopenia Colonoscopy: 2014, on a 10-year schedule  Obstetric History OB History  Gravida Para Term Preterm AB Living  2 2       2   SAB TAB Ectopic Multiple Live Births               # Outcome Date GA Lbr Len/2nd Weight Sex Delivery Anes PTL Lv  2 Para           1 Para              ROS: A ROS was performed and pertinent positives and negatives are included in the history.  GENERAL: No fevers or chills. HEENT: No change in vision, no earache, sore throat or sinus congestion. NECK: No pain or stiffness. CARDIOVASCULAR: No chest pain or pressure. No palpitations. PULMONARY: No shortness of breath, cough or wheeze. GASTROINTESTINAL: No abdominal pain, nausea, vomiting or diarrhea, melena or bright red blood per rectum. GENITOURINARY: No urinary frequency, urgency, hesitancy or dysuria. MUSCULOSKELETAL: No joint or muscle pain, no back pain, no recent trauma. DERMATOLOGIC: No rash, no itching, no lesions. ENDOCRINE: No polyuria, polydipsia, no heat or cold intolerance. No recent change in weight. HEMATOLOGICAL: No anemia or easy bruising or bleeding. NEUROLOGIC: No headache, seizures, numbness, tingling or weakness. PSYCHIATRIC: No depression, no loss of interest in normal activity or change  in sleep pattern.     Exam:   BP 124/80 (BP Location: Right Arm, Patient Position: Sitting, Cuff Size: Normal)   Ht 5\' 6"  (1.676 m)   Wt 155 lb (70.3 kg)   LMP 05/01/2009   BMI 25.02 kg/m   Body mass index is 25.02 kg/m.  General appearance : Well developed well nourished female. No acute distress HEENT: Eyes: no retinal hemorrhage or exudates,  Neck supple, trachea midline, no carotid bruits, no thyroidmegaly Lungs: Clear to auscultation, no rhonchi or wheezes, or rib retractions  Heart: Regular rate and rhythm, no murmurs or gallops Breast:Examined in sitting and supine position were symmetrical in appearance, no palpable masses or tenderness,  no skin retraction, no nipple inversion, no nipple discharge, no skin discoloration, no axillary or supraclavicular lymphadenopathy Abdomen: no palpable masses or tenderness, no rebound or guarding Extremities: no edema or skin discoloration or tenderness  Pelvic: Vulva: Normal             Vagina: No gross lesions or discharge  Cervix: No gross lesions or discharge  Uterus AV, normal size, shape and consistency, non-tender and mobile  Adnexa  Without masses or tenderness  Anus: Normal   Assessment/Plan:  58 y.o. female for annual exam   1. Well female exam with routine gynecological exam Normal gynecologic exam and menopause.  Pap test in November 2018 was negative with negative high-risk HPV, no indication to repeat this year.  Breast exam normal.  Screening mammogram in December 2019 was negative.  Colonoscopy in 2014 was normal, on a 10-year schedule.  Health labs with family physician.  Good body mass index at 25.02.  Continue with fitness and healthy nutrition.  2. Postmenopausal Well on no hormone replacement therapy.  No postmenopausal bleeding.  May use coconut oil for intercourse.  3. Osteopenia of multiple sites Osteopenia on last bone density December 2017.  Will schedule a repeat bone density here now.  Vitamin D  supplements, calcium intake of 1.2 to 1.5 g/day and regular weightbearing physical activity is recommended. - DG Bone Density; Future  Princess Bruins MD, 10:46 AM 11/17/2018

## 2018-11-17 NOTE — Patient Instructions (Addendum)
1. Well female exam with routine gynecological exam Normal gynecologic exam and menopause.  Pap test in November 2018 was negative with negative high-risk HPV, no indication to repeat this year.  Breast exam normal.  Screening mammogram in December 2019 was negative.  Colonoscopy in 2014 was normal, on a 10-year schedule.  Health labs with family physician.  Good body mass index at 25.02.  Continue with fitness and healthy nutrition.  2. Postmenopausal Well on no hormone replacement therapy.  No postmenopausal bleeding.  May use coconut oil for intercourse.  3. Osteopenia of multiple sites Osteopenia on last bone density December 2017.  Will schedule a repeat bone density here now.  Vitamin D supplements, calcium intake of 1.2 to 1.5 g/day and regular weightbearing physical activity is recommended. - DG Bone Density; Future  Cristina Conner, it was a pleasure seeing you today!  I will inform you on your bone density results when available.

## 2018-12-03 ENCOUNTER — Other Ambulatory Visit: Payer: Self-pay

## 2018-12-03 ENCOUNTER — Ambulatory Visit: Payer: Self-pay

## 2018-12-03 ENCOUNTER — Ambulatory Visit: Payer: BC Managed Care – PPO | Admitting: Family Medicine

## 2018-12-03 ENCOUNTER — Encounter: Payer: Self-pay | Admitting: Family Medicine

## 2018-12-03 DIAGNOSIS — M1612 Unilateral primary osteoarthritis, left hip: Secondary | ICD-10-CM | POA: Diagnosis not present

## 2018-12-03 NOTE — Patient Instructions (Addendum)
Good to see you  Ice 20 minutes 2 times daily. Usually after activity and before bed. Keep trucking along Sometimes compression shorts or compression sleeve on the thigh can help with activity  See me again in 3 months  Stay safe!

## 2018-12-03 NOTE — Progress Notes (Signed)
[name] [place], [place] [address] Phone: [phone] Subjective:   I [name] am serving as a Education administrator for Dr. Hulan Saas.    CC: Left hip pain follow-up [id number]   08/11/2018 Repeat injection given again today.  Mated 6 months.  Patient encouraged to consider the possibility of a hip replacement in the near future.  Patient is going to be going on a trip in April and would like to do it after that.  We discussed continuing the home exercise, icing regimen, which activities to do which wants to avoid.  Patient will follow-up with me again 12 weeks  Updated 12/03/2018 Cristina RYBACK is a 58 y.o. female coming in with complaint of left hip pain. States that the hip is painful. Would like an injection.  Patient has known severe osteoarthritic changes of the left hip.  Patient wants to avoid any surgical intervention at this time.  Has had 2 injections since April of last year.  Starting to have increasing discomfort again.  Affecting daily activities.     Past Medical History:  Diagnosis Date  . Hypertension   . Osteopenia   . Post-operative nausea and vomiting    vomiting after general anesthesia per pt.   Past Surgical History:  Procedure Laterality Date  . CESAREAN SECTION     x 2  . ENDOMETRIAL ABLATION    . PELVIC LAPAROSCOPY  1999   left salpingectomy, excision of Right, paratubal cyst  . PELVIC LAPAROSCOPY     laparoscopy with lysis of pelvic adhesions  . SHOULDER SURGERY  11/2010   "FROZEN SHOULDER"  . TUBAL LIGATION     Social History   Socioeconomic History  . Marital status: Married    Spouse name: Not on file  . Number of children: Not on file  . Years of education: Not on file  . Highest education level: Not on file  Occupational History  . Not on file  Social Needs  . Financial resource strain: Not on file  . Food insecurity:    Worry: Not on file    Inability: Not on file  . Transportation  needs:    Medical: Not on file    Non-medical: Not on file  Tobacco Use  . Smoking status: Never Smoker  . Smokeless tobacco: Never Used  Substance and Sexual Activity  . Alcohol use: Not Currently  . Drug use: No  . Sexual activity: Not Currently    Partners: Male    Birth control/protection: Post-menopausal    Comment: 1st intercourse- 41, partners- 11,  married- 73 yrs   Lifestyle  . Physical activity:    Days per week: Not on file    Minutes per session: Not on file  . Stress: Not on file  Relationships  . Social connections:    Talks on phone: Not on file    Gets together: Not on file    Attends religious service: Not on file    Active member of club or organization: Not on file    Attends meetings of clubs or organizations: Not on file    Relationship status: Not on file  Other Topics Concern  . Not on file  Social History Narrative  . Not on file   No Known Allergies Family History  Problem Relation Age of Onset  . Hypertension Mother   . Heart disease Mother   . Diabetes Mother   . Hypertension Father   . Colon  cancer Neg Hx      Current Outpatient Medications (Cardiovascular):  .  lisinopril-hydrochlorothiazide (PRINZIDE,ZESTORETIC) 20-12.5 MG tablet, TAKE 1 TABLET BY MOUTH EVERY DAY   Current Outpatient Medications (Analgesics):  .  meloxicam (MOBIC) 15 MG tablet, 0.5-1 tab po qd prn pain   Current Outpatient Medications (Other):  .  calcium citrate (CALCITRATE - DOSED IN MG ELEMENTAL CALCIUM) 950 MG tablet, Take 200 mg of elemental calcium by mouth daily. .  cholecalciferol (VITAMIN D3) 25 MCG (1000 UT) tablet, Take 1,000 Units by mouth daily. .  Misc Natural Products (TART CHERRY ADVANCED PO), Take 1 capsule by mouth daily. .  TURMERIC PO, Take 1 tablet by mouth daily.    Past medical history, social, surgical and family history all reviewed in electronic medical record.  No pertanent information unless stated regarding to the chief complaint.    Review of Systems:  No headache, visual changes, nausea, vomiting, diarrhea, constipation, dizziness, abdominal pain, skin rash, fevers, chills, night sweats, weight loss, swollen lymph nodes, body aches, joint swelling,  chest pain, shortness of breath, mood changes.  Positive muscle aches  Objective  Blood pressure 118/70, pulse 98, height 5\' 6"  (1.676 m), weight 159 lb (72.1 kg), last menstrual period 05/01/2009, SpO2 95 %.   General: No apparent distress alert and oriented x3 mood and affect normal, dressed appropriately.  HEENT: Pupils equal, extraocular movements intact  Respiratory: Patient's speak in full sentences and does not appear short of breath  Cardiovascular: No lower extremity edema, non tender, no erythema  Skin: Warm dry intact with no signs of infection or rash on extremities or on axial skeleton.  Abdomen: Soft nontender  Neuro: Cranial nerves II through XII are intact, neurovascularly intact in all extremities with 2+ DTRs and 2+ pulses.  Lymph: No lymphadenopathy of posterior or anterior cervical chain or axillae bilaterally.  Gait antalgic gait MSK:  Non tender with full range of motion and good stability and symmetric strength and tone of shoulders, elbows, wrist, knee and ankles bilaterally.   Left hip exam has decreased range of motion with only 2 degrees of internal range of motion.  He also has some stiffness and decreased range of motion with only 30 degrees of external rotation.  Negative straight leg test.  Pain in the groin more on palpation.  No significant atrophy of the musculature of the 4-5 strength compared to the contralateral side  Procedure: Real-time Ultrasound Guided Injection of left intra-articular hip Device: GE Logiq Q7  Ultrasound guided injection is preferred based studies that show increased duration, increased effect, greater accuracy, decreased procedural pain, increased response rate with ultrasound guided versus blind injection.  Verbal  informed consent obtained.  Time-out conducted.  Noted no overlying erythema, induration, or other signs of local infection.  Skin prepped in a sterile fashion.  Local anesthesia: Topical Ethyl chloride.  With sterile technique and under real time ultrasound guidance:  Anterior capsule visualized, needle visualized going to the head neck junction at the anterior capsule. Pictures taken. Patient did have injection of 3 cc of 0.5% Marcaine, and 1 cc of Kenalog 40 mg/dL. Completed without difficulty  Pain immediately resolved suggesting accurate placement of the medication.  Advised to call if fevers/chills, erythema, induration, drainage, or persistent bleeding.  Images permanently stored and available for review in the ultrasound unit.  Impression: Technically successful ultrasound guided injection.     Impression and Recommendations:     This case required medical decision making of moderate complexity. The above  documentation has been reviewed and is accurate and complete Lyndal Pulley, DO       Note: This dictation was prepared with Dragon dictation along with smaller phrase technology. Any transcriptional errors that result from this process are unintentional.

## 2018-12-03 NOTE — Assessment & Plan Note (Signed)
Patient given a repeat injection.  Patient now has had 3 in a 14-month period.  Discussed with patient at this time needs to consider possible surgical intervention problems with the current outbreak we will need to continue to monitor.  Patient can have another 1 in 3 months if needed.

## 2018-12-09 ENCOUNTER — Ambulatory Visit: Payer: BC Managed Care – PPO | Admitting: Family Medicine

## 2018-12-17 ENCOUNTER — Ambulatory Visit: Payer: BC Managed Care – PPO | Admitting: Family Medicine

## 2019-01-15 ENCOUNTER — Other Ambulatory Visit: Payer: Self-pay | Admitting: Family Medicine

## 2019-01-15 DIAGNOSIS — M1612 Unilateral primary osteoarthritis, left hip: Secondary | ICD-10-CM

## 2019-03-09 ENCOUNTER — Encounter: Payer: Self-pay | Admitting: Family Medicine

## 2019-03-10 ENCOUNTER — Other Ambulatory Visit: Payer: Self-pay

## 2019-03-30 ENCOUNTER — Ambulatory Visit: Payer: BC Managed Care – PPO | Admitting: Family Medicine

## 2019-04-01 ENCOUNTER — Ambulatory Visit (INDEPENDENT_AMBULATORY_CARE_PROVIDER_SITE_OTHER): Payer: BC Managed Care – PPO | Admitting: Family Medicine

## 2019-04-01 ENCOUNTER — Encounter: Payer: Self-pay | Admitting: Family Medicine

## 2019-04-01 ENCOUNTER — Other Ambulatory Visit: Payer: Self-pay

## 2019-04-01 VITALS — BP 109/81 | Ht 66.0 in | Wt 150.4 lb

## 2019-04-01 DIAGNOSIS — Z634 Disappearance and death of family member: Secondary | ICD-10-CM | POA: Diagnosis not present

## 2019-04-01 DIAGNOSIS — F329 Major depressive disorder, single episode, unspecified: Secondary | ICD-10-CM | POA: Insufficient documentation

## 2019-04-01 DIAGNOSIS — Z636 Dependent relative needing care at home: Secondary | ICD-10-CM

## 2019-04-01 DIAGNOSIS — G479 Sleep disorder, unspecified: Secondary | ICD-10-CM

## 2019-04-01 MED ORDER — ZOLPIDEM TARTRATE ER 6.25 MG PO TBCR: 6.2500 mg | EXTENDED_RELEASE_TABLET | Freq: Every evening | ORAL | 0 refills | Status: DC | PRN

## 2019-04-01 MED ORDER — FLUOXETINE HCL 10 MG PO TABS: ORAL_TABLET | ORAL | 0 refills | Status: DC

## 2019-04-01 NOTE — Progress Notes (Signed)
Telehealth office visit note for Cristina Conner, D.O- at Primary Care at [facility]   I connected with current patient today and verified that I am speaking with the correct person using two identifiers.    Location of the patient: Home  Location of the provider: Office Only the patient (+/- their family members at pt's discretion) and myself were participating in the encounter    - This visit type was conducted due to national recommendations for restrictions regarding the COVID-19 Pandemic (e.g. social distancing) in an effort to limit this patient's exposure and mitigate transmission in our community.  This format is felt to be most appropriate for this patient at this time.   - The patient did not have access to video technology or had technical difficulties with video requiring transitioning to audio format only. - No physical exam could be performed with this format, beyond that communicated to [place] by the patient/ family members as noted.   - Additionally my office staff/ schedulers discussed with the patient that there may be a monetary charge related to this service, depending on their medical insurance.   The patient expressed understanding, and agreed to proceed.       History of Present Illness:  States she is doing reasonably well.  Overall says she's been doing okay physically.  However, her mood is struggling.  Recent Death of Husband Her husband passed away two weeks ago on April 07, 2023.  Patient states that her husband had severe pneumonia.  He did not have COVID; states that he had five COVID tests between April and July, and he "kept getting different bacteria on his lungs" and "never could turn the corner on it."  Notes they kept finding different bacteria on his lungs through [facility].  Patient's mother has been in the hospital recently as well, after falling and cracking her rib.  Patient repeats that physically she's doing well, but mentally it's hard to deal with  everything.  Support System: Her daughter typically lives in [place] and has returned home to be with patient until August 12th.  Notes that her daughter has been helping the patient handle business and household tasks.  For instance, yesterday they took husband's medications to the [facility] drop.  States they had "two big hospital bags that were full of medications."  Says "I made it through, but I'm having my moments."  Her son remains here in [place].  He has a newborn child.  He and his fiance live nearby.  Patient's husband was struggling with leukemia.  Patient says the light at the end of the tunnel is that he is no longer suffering, because he was so sick.  Says between the bone marrow transplants and the pneumonia, the pneumonia was the worst, because he never received good news.  Mood & Anxiety Says she goes from being really sad to being really angry.    Her cousin recommended a therapist, but patient has not been able to seek this care yet.  She is still working on finding a therapist to talk to about everything she's been going through all these years.  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  Says she might be sleeping 4-5 hours per night, but it's "not straight."  She wakes up and dozes back off throughout the night.  States she has difficulty staying asleep.  Patient states she was taking trazodone formerly, but that this caused her to feel groggy.  Notes she cut the trazodone tablet in half but it doesn't seem to help much.  She thinks she took Lunesta in the past and isn't sure why  she discontinued this, whether it was due to insurance or something else.  Chronic Pain She does not take meloxicam daily.  Says "I just deal with the pain because I don't like to take a lot of medications."  Blood Pressure Feels her blood pressure may be running around 104/81.  Feels a little bit lightheaded.  Says "when I get up from a lying down position," she experiences lightheadedness, "but not all the time."  Says she also measured it at 100/79.  "I have not been taking my blood pressure.  Today is the first day I took it in a while."  States her pulse has been 119 today.   Impression and Recommendations:    1. Reactive depression   2. Sleep difficulties   3. Death of family member   85. Caregiver burden- for husband with ALLeukemia      - As part of my medical decision making, I reviewed the following data within the [facility] History obtained from pt /family, CMA notes reviewed and incorporated if applicable, Labs reviewed, Radiograph/ tests reviewed if applicable and OV notes from prior OV's with me, as well as other specialists she/he has seen since seeing me last, were all reviewed and used in my medical decision making process today.   - Additionally, discussion had with patient regarding txmnt plan, and their biases/concerns about that plan were used in my medical decision making today.   - The patient agreed with the plan and demonstrated an understanding of the instructions.   No barriers to understanding were identified.   - Red flag symptoms and signs discussed in detail.  Patient expressed understanding regarding what to do in case of emergency\ urgent symptoms.  The patient was advised to call back or seek an in-person evaluation if the symptoms worsen or if the condition fails to improve as anticipated.  Reactive Depression after Death of Husband, Caregiver Burden - Patient agrees that she needs attention for her mood difficulties now.  - Comforted,  reassured, and advised patient regarding acute mood distress during televisit today. - Extensively discussed beginning medication.  Education was provided and all questions were answered.  - Begin medication today.  See med list. - Discussed tapering dose of Rx up as indicated in directions.  Extensive education was provided.  Patient knows to reduce side-effects, she may begin with a half a tablet for a week, then the full tablet for a week, and then two tablets after that.  - Referral to [facility] offered today.  Patient agrees to referral. - Ambulatory Referral to [facility] placed today.  - Reviewed the "spokes of the wheel" of mood and health management.  Stressed the importance of ongoing prudent habits, including regular exercise, appropriate sleep hygiene, healthful dietary habits, and prayer/meditation to relax.  - Follow up in 2 weeks. - Will continue to monitor.  Sleep Difficulties due to Grief & Mood Difficulty - Per  patient was taking trazodone formerly, but this caused her to feel groggy, and reducing the tablet by half had no effect.  Per pt, also took Lunesta in the past.  - Low-dose Ambien prescribed today, sustained-release. - Risks and benefits of medication reviewed.  Education provided and all questions answered. - Follow up in 2 weeks.  - Extensive education was provided to patient today.  Blood Pressure Monitoring - Per patient, periodically feeling lightheaded. - Ambulatory blood pressure & heart rate monitoring encouraged. - Discussed importance of making decisions based on BP log of multiple data points.  - Advised visiting www.heart.org to check and read about monitoring blood pressure at home.  - Re-check in 2-3 weeks. - Will continue to monitor.   No follow-ups on file.    Orders Placed This Encounter  Procedures   Ambulatory referral to Psychology    Meds ordered this encounter  Medications   DISCONTD: FLUoxetine (PROZAC) 10  MG tablet    Sig: Take 1 tablet (10 mg total) by mouth daily for 8 days, THEN 2 tablets (20 mg total) daily.    Dispense:  172 tablet    Refill:  0   zolpidem (AMBIEN CR) 6.25 MG CR tablet    Sig: Take 1 tablet (6.25 mg total) by mouth at bedtime as needed for sleep.    Dispense:  90 tablet    Refill:  0    There are no discontinued medications.    I provided 28 minutes of non-face-to-face time during this encounter,with over 50% of the time in direct counseling on patients medical conditions/ medical concerns.  Additional time was spent with charting and coordination of care after the actual visit commenced.   Note:  This note was prepared with assistance of Dragon voice recognition software. Occasional wrong-word or sound-a-like substitutions may have occurred due to the inherent limitations of voice recognition software.  Cristina Dance, DO  This document serves as a record of services personally performed by Cristina Dance, DO. It was created on her behalf by [name], a trained medical scribe. The creation of this record is based on the scribe's personal observations and the provider's statements to them.   I have reviewed the above medical documentation for accuracy and completeness and I concur.  Cristina Dance, DO 04/15/2019 10:21 AM        Patient Care Team    Relationship Specialty Notifications Start End  Cristina Dance, DO PCP - General Family Medicine  01/02/18   Druscilla Brownie, MD Consulting Physician Dermatology  01/02/18   Opthamology, [facility]  Ophthalmology  01/02/18   Princess Bruins, MD Consulting Physician Obstetrics and Gynecology  03/04/18      -Vitals obtained; medications/ allergies reconciled;  personal medical, social, Sx etc.histories were updated by CMA, reviewed by me and are reflected in chart   Patient Active Problem List   Diagnosis Date Noted   Pre-diabetes 03/09/2018    Priority: High   Hypertension 05/20/2011     Priority: High   Osteopenia 07/31/2016    Priority: Medium   Postmenopausal state- went thru early 40's 01/02/2018    Priority: Low   Reactive depression 04/01/2019   Sleep difficulties 09/09/2018   Primary osteoarthritis of left hip 01/02/2018   Caregiver burden- for husband with ALLeukemia 01/02/2018   Left hip pain 01/02/2018   Menopausal state 05/20/2011     Current Meds  Medication Sig   calcium citrate (CALCITRATE - DOSED IN MG ELEMENTAL CALCIUM) 950 MG tablet Take 200 mg  of elemental calcium by mouth daily.   cholecalciferol (VITAMIN D3) 25 MCG (1000 UT) tablet Take 1,000 Units by mouth daily.   lisinopril-hydrochlorothiazide (PRINZIDE,ZESTORETIC) 20-12.5 MG tablet TAKE 1 TABLET BY MOUTH EVERY DAY   meloxicam (MOBIC) 15 MG tablet TAKE 1/2 TO 1 TABLET DAILY AS NEEDED FOR PAIN   Misc Natural Products (TART CHERRY ADVANCED PO) Take 1 capsule by mouth daily.   TURMERIC PO Take 1 tablet by mouth daily.     Allergies:  No Known Allergies   ROS:  See above HPI for pertinent positives and negatives   Objective:   Blood pressure 109/81, height 5\' 6"  (1.676 m), weight 150 lb 6.4 oz (68.2 kg), last menstrual period 05/01/2009.  (if some vitals are omitted, this means that patient was UNABLE to obtain them even though they were asked to get them prior to OV today.  They were asked to call us at their earliest convenience with these once obtained. )  General: A & O * 3; sounds in no acute distress; in usual state of health.  Skin: Pt confirms warm and dry extremities and pink fingertips HEENT: Pt confirms lips non-cyanotic Chest: Patient confirms normal chest excursion and movement Respiratory: speaking in full sentences, no conversational dyspnea; patient confirms no use of accessory muscles Psych: insight appears good, mood- appears full

## 2019-04-05 ENCOUNTER — Encounter: Payer: Self-pay | Admitting: Family Medicine

## 2019-04-06 ENCOUNTER — Other Ambulatory Visit: Payer: Self-pay

## 2019-04-06 MED ORDER — FLUOXETINE HCL 10 MG PO CAPS: ORAL_CAPSULE | ORAL | 0 refills | Status: DC

## 2019-04-06 NOTE — Progress Notes (Signed)
Sent in RX for Prozac capsules as insurance does not cover tablets. MPulliam, CMA/RT(R)

## 2019-04-07 ENCOUNTER — Ambulatory Visit (INDEPENDENT_AMBULATORY_CARE_PROVIDER_SITE_OTHER): Payer: BC Managed Care – PPO | Admitting: Family Medicine

## 2019-04-07 ENCOUNTER — Other Ambulatory Visit: Payer: Self-pay

## 2019-04-07 ENCOUNTER — Encounter: Payer: Self-pay | Admitting: Family Medicine

## 2019-04-07 ENCOUNTER — Ambulatory Visit: Payer: Self-pay

## 2019-04-07 DIAGNOSIS — M1612 Unilateral primary osteoarthritis, left hip: Secondary | ICD-10-CM

## 2019-04-07 NOTE — Assessment & Plan Note (Signed)
Repeat injection given again today.  Patient tolerated the procedure well.  Still has significant antalgic gait.  Patient recently did lose her husband but now has time to take care of herself.  Patient will consider the possibility of replacement and referral to orthopedic surgery done today.  Patient can follow-up with pain more on an as-needed basis or we can repeat injections every 10 weeks.

## 2019-04-07 NOTE — Progress Notes (Signed)
[facility] [place], [place] [address] Phone: [phone] Subjective:   Fontaine No, am serving as a scribe for Dr. Hulan Saas.  CC: Left hip pain follow-up  POE:[id number]   12/03/2018 Patient given a repeat injection.  Patient now has had 3 in a 70-month period.  Discussed with patient at this time needs to consider possible surgical intervention problems with the current outbreak we will need to continue to monitor.  Patient can have another 1 in 3 months if needed.  Update 04/07/2019 Cristina Conner is a 58 y.o. female coming in with complaint of left hip pain. Had to walk with cane on Saturday. Woke up in pain.  Patient states that it is hurting her significantly more at the moment.  This is affecting daily activities as well as sleep.  Rates the severity of pain is 8 out of 10.    Patient has had x-rays that are independently visualized by me showing severe osteoarthritic changes of the hip.  Last injection was December 03, 2018  Past Medical History:  Diagnosis Date  . Hypertension   . Osteopenia   . Post-operative nausea and vomiting    vomiting after general anesthesia per pt.   Past Surgical History:  Procedure Laterality Date  . CESAREAN SECTION     x 2  . ENDOMETRIAL ABLATION    . PELVIC LAPAROSCOPY  1999   left salpingectomy, excision of Right, paratubal cyst  . PELVIC LAPAROSCOPY     laparoscopy with lysis of pelvic adhesions  . SHOULDER SURGERY  11/2010   "FROZEN SHOULDER"  . TUBAL LIGATION     Social History   Socioeconomic History  . Marital status: Married    Spouse name: Not on file  . Number of children: Not on file  . Years of education: Not on file  . Highest education level: Not on file  Occupational History  . Not on file  Social Needs  . Financial resource strain: Not on file  . Food insecurity    Worry: Not on file    Inability: Not on file  . Transportation needs    Medical: Not on file   Non-medical: Not on file  Tobacco Use  . Smoking status: Never Smoker  . Smokeless tobacco: Never Used  Substance and Sexual Activity  . Alcohol use: Not Currently  . Drug use: No  . Sexual activity: Not Currently    Partners: Male    Birth control/protection: Post-menopausal    Comment: 1st intercourse- 30, partners- 103,  married- 59 yrs   Lifestyle  . Physical activity    Days per week: Not on file    Minutes per session: Not on file  . Stress: Not on file  Relationships  . Social Herbalist on phone: Not on file    Gets together: Not on file    Attends religious service: Not on file    Active member of club or organization: Not on file    Attends meetings of clubs or organizations: Not on file    Relationship status: Not on file  Other Topics Concern  . Not on file  Social History Narrative  . Not on file   No Known Allergies Family History  Problem Relation Age of Onset  . Hypertension Mother   . Heart disease Mother   . Diabetes Mother   . Hypertension Father   . Colon cancer Neg Hx  Current Outpatient Medications (Cardiovascular):  .  lisinopril-hydrochlorothiazide (PRINZIDE,ZESTORETIC) 20-12.5 MG tablet, TAKE 1 TABLET BY MOUTH EVERY DAY   Current Outpatient Medications (Analgesics):  .  meloxicam (MOBIC) 15 MG tablet, TAKE 1/2 TO 1 TABLET DAILY AS NEEDED FOR PAIN   Current Outpatient Medications (Other):  .  calcium citrate (CALCITRATE - DOSED IN MG ELEMENTAL CALCIUM) 950 MG tablet, Take 200 mg of elemental calcium by mouth daily. .  cholecalciferol (VITAMIN D3) 25 MCG (1000 UT) tablet, Take 1,000 Units by mouth daily. Marland Kitchen  FLUoxetine (PROZAC) 10 MG capsule, Take 1 capsule daily for 8 days then 2 capsules daily .  Misc Natural Products (TART CHERRY ADVANCED PO), Take 1 capsule by mouth daily. .  TURMERIC PO, Take 1 tablet by mouth daily. Marland Kitchen  zolpidem (AMBIEN CR) 6.25 MG CR tablet, Take 1 tablet (6.25 mg total) by mouth at bedtime as needed for  sleep.    Past medical history, social, surgical and family history all reviewed in electronic medical record.  No pertanent information unless stated regarding to the chief complaint.   Review of Systems:  No headache, visual changes, nausea, vomiting, diarrhea, constipation, dizziness, abdominal pain, skin rash, fevers, chills, night sweats, weight loss, swollen lymph nodes, body aches, joint swelling, muscle aches, chest pain, shortness of breath, mood changes.   Objective  Last menstrual period 05/01/2009.   General: No apparent distress alert and oriented x3 mood and affect normal, dressed appropriately.  HEENT: Pupils equal, extraocular movements intact  Respiratory: Patient's speak in full sentences and does not appear short of breath  Cardiovascular: No lower extremity edema, non tender, no erythema  Skin: Warm dry intact with no signs of infection or rash on extremities or on axial skeleton.  Abdomen: Soft nontender  Neuro: Cranial nerves II through XII are intact, neurovascularly intact in all extremities with 2+ DTRs and 2+ pulses.  Lymph: No lymphadenopathy of posterior or anterior cervical chain or axillae bilaterally.  Gait antalgic MSK:  tender with limited range of motion and good stability and symmetric strength and tone of shoulders, elbows, wrist, knee and ankles bilaterally.  Left hip does decreased range of motion in all planes.  Patient does have significant decrease in internal range of motion which seems to be worsening.  4 out of 5 strength of the hip flexor compared to the contralateral side.  Neurovascularly intact distally.   Procedure: Real-time Ultrasound Guided Injection of left intra-articular hip Device: GE Logiq Q7  Ultrasound guided injection is preferred based studies that show increased duration, increased effect, greater accuracy, decreased procedural pain, increased response rate with ultrasound guided versus blind injection.  Verbal informed consent  obtained.  Time-out conducted.  Noted no overlying erythema, induration, or other signs of local infection.  Skin prepped in a sterile fashion.  Local anesthesia: Topical Ethyl chloride.  With sterile technique and under real time ultrasound guidance:  Anterior capsule visualized, needle visualized going to the head neck junction at the anterior capsule. Pictures taken. Patient did have injection of 3 cc of 0.5% Marcaine, and 1 cc of Kenalog 40 mg/dL. Completed without difficulty  Pain immediately resolved suggesting accurate placement of the medication.  Advised to call if fevers/chills, erythema, induration, drainage, or persistent bleeding.  Images permanently stored and available for review in the ultrasound unit.  Impression: Technically successful ultrasound guided injection.   Impression and Recommendations:     This case required medical decision making of moderate complexity. The above documentation has been reviewed and is  accurate and complete Lyndal Pulley, DO       Note: This dictation was prepared with Dragon dictation along with smaller phrase technology. Any transcriptional errors that result from this process are unintentional.

## 2019-04-07 NOTE — Patient Instructions (Signed)
Injected hip today Referral to Dr. Rhona Raider

## 2019-04-15 ENCOUNTER — Encounter: Payer: Self-pay | Admitting: Family Medicine

## 2019-04-15 ENCOUNTER — Ambulatory Visit (INDEPENDENT_AMBULATORY_CARE_PROVIDER_SITE_OTHER): Payer: BC Managed Care – PPO | Admitting: Family Medicine

## 2019-04-15 ENCOUNTER — Ambulatory Visit (INDEPENDENT_AMBULATORY_CARE_PROVIDER_SITE_OTHER): Payer: BC Managed Care – PPO | Admitting: Psychology

## 2019-04-15 ENCOUNTER — Other Ambulatory Visit: Payer: Self-pay

## 2019-04-15 VITALS — BP 127/84 | HR 84 | Ht 66.0 in | Wt 149.0 lb

## 2019-04-15 DIAGNOSIS — G479 Sleep disorder, unspecified: Secondary | ICD-10-CM

## 2019-04-15 DIAGNOSIS — I10 Essential (primary) hypertension: Secondary | ICD-10-CM

## 2019-04-15 DIAGNOSIS — F4323 Adjustment disorder with mixed anxiety and depressed mood: Secondary | ICD-10-CM | POA: Diagnosis not present

## 2019-04-15 DIAGNOSIS — Z634 Disappearance and death of family member: Secondary | ICD-10-CM | POA: Diagnosis not present

## 2019-04-15 DIAGNOSIS — M1612 Unilateral primary osteoarthritis, left hip: Secondary | ICD-10-CM

## 2019-04-15 DIAGNOSIS — F329 Major depressive disorder, single episode, unspecified: Secondary | ICD-10-CM | POA: Diagnosis not present

## 2019-04-15 DIAGNOSIS — M25552 Pain in left hip: Secondary | ICD-10-CM

## 2019-04-15 DIAGNOSIS — Z719 Counseling, unspecified: Secondary | ICD-10-CM

## 2019-04-15 NOTE — Progress Notes (Signed)
Virtual / live video office visit note for [employer], D.O- Primary Care Physician at [facility]   I connected with current patient today and beyond visually recognizing the correct individual, I verified that I am speaking with the correct person using two identifiers.  . Location of the patient: Home . Location of the provider: Office Only the patient (+/- their family members at pt's discretion) and myself were participating in the encounter    - This visit type was conducted due to national recommendations for restrictions regarding the COVID-19 Pandemic (e.g. social distancing) in an effort to limit this patient's exposure and mitigate transmission in our community.  This format is felt to be most appropriate for this patient at this time.   - The patient did have access to video technology today yet, we had technical difficulties with this method, requiring transitioning to audio only.    - No physical exam could be performed with this format, beyond that communicated to [place] by the patient/ family members as noted.   - Additionally my office staff/ schedulers discussed with the patient that there may be a monetary charge related to this service, depending on patient's medical insurance.   The patient expressed understanding, and agreed to proceed.      History of Present Illness:  Patient has upcoming hip surgery on her left hip.  Notes she's been putting it off and putting it off and finally is going to have this surgery done.  She has not been exercising due to her hip bothering her.  She plans to return to the [facility] to begin working this coming Saturday, working the [employer].  Notes she will be wearing a mask, being behind plexiglass, and monitoring her health.  Mood Management Says she is now set up with a therapist in her [place].  They have a virtual appointment at 3 PM.  Says "It took me a while to start the Prozac, because my insurance wouldn't approve a  tablet; they would only approve a capsule."  She didn't start taking it until this past Saturday.  Notes on the Prozac she "doesn't think I feel any different at all."  Says "I'm not quite as stressed out."  Not sure if this is due to the medication or due to "time."  Her daughter left yesterday and patient is now at home by herself.  Says "I'm doing okay, but I still think I need to talk to someone."  Says she still feels kind of anxious, "the least little thing will kind of take me over the edge."  Insomnia Management On the Ambien, states she took one tablet and slept the entire night, and then the entire next day as well.  Blood Pressure at Home Her blood pressure has averaged out to 107/78.  Her heart rate average was 84.25.  Says her lowest pulse was around 71, but running mostly in the mid 70's to low 80's.  Notes her heart rate has gone down, "thank goodness," and "I think I was just really anxious about everything that was going on."     GAD 7 : Generalized Anxiety Score 04/15/2019 04/01/2019  Nervous, Anxious, on Edge 2 3  Control/stop worrying 2 3  Worry too much - different things 2 3  Trouble relaxing 2 3  Restless 1 1  Easily annoyed or irritable 2 3  Afraid - awful might happen 3 2  Total GAD 7 Score 14 18  Anxiety Difficulty Very  difficult Very difficult    Depression screen [facility] 2/9 04/29/2019 04/15/2019 04/01/2019  Decreased Interest 1 3 3   Down, Depressed, Hopeless 0 3 3  PHQ - 2 Score 1 6 6   Altered sleeping 3 3 3   Tired, decreased energy 1 3 3   Change in appetite 1 1 2   Feeling bad or failure about yourself  0 1 1  Trouble concentrating 0 1 0  Moving slowly or fidgety/restless 0 1 0  Suicidal thoughts 0 0 0  PHQ-9 Score 6 16 15   Difficult doing work/chores Somewhat difficult Very difficult Very difficult     Impression and Recommendations:    1. Reactive depression   2. Death of family member   8. Sleep difficulties   4. Essential hypertension    5. Left hip pain   6. Primary osteoarthritis of left hip   7. Health education/counseling      - Prudent practices during [place] reviewed during appointment.  - Reassured patient that she is doing well and deserves to go easy on herself through this time.  Reactive Depression after Death of Husband - Started Prozac last appointment. - Per patient, insurance would only cover capsules of Prozac. - Increase dose of Prozac to two capsules per day.  See med list.  - Advised patient that the changes on mood management are meant to be very subtle.  Discussed that it's not meant to change [place] as a person, but just make things more tolerable.  - Extensive education provided today and all questions were answered.  - Reviewed the "spokes of the wheel" of mood and health management.  Stressed the importance of ongoing prudent habits, including regular exercise, appropriate sleep hygiene, healthful dietary habits, and prayer/meditation to relax.  - STRONGLY advised patient to continue following up with therapist/ilife coach as established.  - Patient knows to continue to call in PRN with any concerns.  - Will continue to monitor.  Sleep Difficulties - As one full tablet of Ambien caused excessive sleepiness, advised patient to cut her tablet in half. - If patient wishes, she knows she can transition to melatonin instead of Ambien as well. - Education provided today and all questions were answered.  - Will continue to monitor.  Blood Pressure Monitoring - Stable at this time. - Ongoing BP monitoring encouraged. - Will continue to monitor.  Left Hip Pain and Arthritis - Follow-up through orthopedics. - Going in for consultation and possible surgery soon.  Recommendations - Re-check in 2 weeks. - Otherwise, patient knows to call in PRN with any concerns about health or meds. - Will continue to monitor.  - As part of my medical decision making, I reviewed the following data within  the [place] History obtained from pt /family, CMA notes reviewed and incorporated if applicable, Labs reviewed, Radiograph/ tests reviewed if applicable and OV notes from prior OV's with me, as well as other specialists she/he has seen since seeing me last, were all reviewed and used in my medical decision making process today.   - Additionally, discussion had with patient regarding txmnt plan, their biases about that plan etc were used in my medical decision making today.   - The patient agreed with the plan and demonstrated an understanding of the instructions.   No barriers to understanding were identified.   - Red flag symptoms and signs discussed in detail.  Patient expressed understanding regarding what to do in case of emergency\ urgent symptoms.  The patient was advised to  call back or seek an in-person evaluation if the symptoms worsen or if the condition fails to improve as anticipated.   Return for 2 weeks per patient request-increase Prozac from 10 mg to 20.    No orders of the defined types were placed in this encounter.   No orders of the defined types were placed in this encounter.   There are no discontinued medications.    I provided 25+ minutes of non-face-to-face time during this encounter,with over 50% of the time in direct counseling on patients medical conditions/ medical concerns.  Additional time was spent with charting and coordination of care after the actual visit commenced.   Note:  This note was prepared with assistance of Dragon voice recognition software. Occasional wrong-word or sound-a-like substitutions may have occurred due to the inherent limitations of voice recognition software.  Mellody Dance, DO  This document serves as a record of services personally performed by Mellody Dance, DO. It was created on her behalf by [name], a trained medical scribe. The creation of this record is based on the scribe's personal  observations and the provider's statements to them.   I have reviewed the above medical documentation for accuracy and completeness and I concur.   Mellody Dance, DO 04/18/2019 8:24 AM       Patient Care Team    Relationship Specialty Notifications Start End  Mellody Dance, DO PCP - General Family Medicine  01/02/18   Druscilla Brownie, MD Consulting Physician Dermatology  01/02/18   Opthamology, [facility]  Ophthalmology  01/02/18   Princess Bruins, MD Consulting Physician Obstetrics and Gynecology  03/04/18     -Vitals obtained; medications/ allergies reconciled;  personal medical, social, Sx etc.histories were updated by CMA, reviewed by me and are reflected in chart  Patient Active Problem List   Diagnosis Date Noted  . Pre-diabetes 03/09/2018    Priority: High  . Hypertension 05/20/2011    Priority: High  . Osteopenia 07/31/2016    Priority: Medium  . Postmenopausal state- went thru early 40's 01/02/2018    Priority: Low  . 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 Sleep difficulties 09/09/2018  . Primary osteoarthritis of left hip 01/02/2018  . Caregiver burden- for husband with ALLeukemia 01/02/2018  . Left hip pain 01/02/2018  . Menopausal state 05/20/2011     Current Meds  Medication Sig  . calcium citrate (CALCITRATE - DOSED IN MG ELEMENTAL CALCIUM) 950 MG tablet Take 200 mg of elemental calcium by mouth daily.  . cholecalciferol (VITAMIN D3) 25 MCG (1000 UT) tablet Take 1,000 Units by mouth daily.  Marland Kitchen FLUoxetine (PROZAC) 10 MG capsule Take 1 capsule daily for 8 days then 2 capsules daily  . lisinopril-hydrochlorothiazide (PRINZIDE,ZESTORETIC) 20-12.5 MG tablet TAKE 1 TABLET BY MOUTH EVERY DAY  . meloxicam (MOBIC) 15 MG tablet TAKE 1/2 TO 1 TABLET DAILY AS NEEDED FOR PAIN  . Misc Natural Products (TART CHERRY ADVANCED PO) Take 1 capsule by mouth daily.  . TURMERIC PO Take 1 tablet by mouth daily.     No Known Allergies   ROS:  See above HPI for pertinent  positives and negatives   Objective:   Blood pressure 127/84, pulse 84, height 5\' 6"  (1.676 m), weight 149 lb (67.6 kg), last menstrual period 05/01/2009.  (if some vitals are omitted, this means that patient was UNABLE to obtain them even though they were asked to get them prior to OV today.  They were asked to call us at their earliest convenience with these once  obtained.)  General: A & O * 3; visually in no acute distress; in usual state of health.  Skin: Visible skin appears normal and pt's usual skin color HEENT:  EOMI, head is normocephalic and atraumatic.  Sclera are anicteric. Neck has a good range of motion.  Lips are noncyanotic Chest: normal chest excursion and movement Respiratory: speaking in full sentences, no conversational dyspnea; no use of accessory muscles Psych: insight good, mood- appears full

## 2019-04-18 ENCOUNTER — Encounter: Payer: Self-pay | Admitting: Family Medicine

## 2019-04-23 ENCOUNTER — Ambulatory Visit (INDEPENDENT_AMBULATORY_CARE_PROVIDER_SITE_OTHER): Payer: BC Managed Care – PPO | Admitting: Psychology

## 2019-04-23 DIAGNOSIS — F4323 Adjustment disorder with mixed anxiety and depressed mood: Secondary | ICD-10-CM

## 2019-04-23 DIAGNOSIS — Z634 Disappearance and death of family member: Secondary | ICD-10-CM

## 2019-04-29 ENCOUNTER — Other Ambulatory Visit: Payer: Self-pay

## 2019-04-29 ENCOUNTER — Encounter: Payer: Self-pay | Admitting: Family Medicine

## 2019-04-29 ENCOUNTER — Ambulatory Visit (INDEPENDENT_AMBULATORY_CARE_PROVIDER_SITE_OTHER): Payer: BC Managed Care – PPO | Admitting: Family Medicine

## 2019-04-29 VITALS — BP 107/76 | HR 97 | Ht 66.0 in | Wt 144.2 lb

## 2019-04-29 DIAGNOSIS — Z634 Disappearance and death of family member: Secondary | ICD-10-CM

## 2019-04-29 DIAGNOSIS — F329 Major depressive disorder, single episode, unspecified: Secondary | ICD-10-CM

## 2019-04-29 DIAGNOSIS — G479 Sleep disorder, unspecified: Secondary | ICD-10-CM | POA: Diagnosis not present

## 2019-04-29 MED ORDER — ESZOPICLONE 1 MG PO TABS: ORAL_TABLET | ORAL | 0 refills | Status: DC

## 2019-04-29 MED ORDER — FLUOXETINE HCL 10 MG PO CAPS: ORAL_CAPSULE | ORAL | 0 refills | Status: DC

## 2019-04-29 NOTE — Progress Notes (Signed)
Virtual / live video office visit note for [employer], D.O- Primary Care Physician at [facility]   I connected with current patient today and beyond visually recognizing the correct individual, I verified that I am speaking with the correct person using two identifiers.  . Location of the patient: Home . Location of the provider: Office Only the patient (+/- their family members at pt's discretion) and myself were participating in the encounter    - This visit type was conducted due to national recommendations for restrictions regarding the COVID-19 Pandemic (e.g. social distancing) in an effort to limit this patient's exposure and mitigate transmission in our community.  This format is felt to be most appropriate for this patient at this time.   - The patient did have access to video technology today   - No physical exam could be performed with this format, beyond that communicated to [place] by the patient/ family members as noted.   - Additionally my office staff/ schedulers discussed with the patient that there may be a monetary charge related to this service, depending on patient's medical insurance.   The patient expressed understanding, and agreed to proceed.      History of Present Illness:   Mood Management During Grief Says she's doing better, but thinks the Prozac is working too much.  When she doubled up on the dose, says "I just feel really nervous.  "sometimes I look at my hands to make sure they're not shaking; they're not shaking but I feel like they are.  I just feel really nervous."  Notes she's also had some weird dreams, stranger than usual.  Confirms trying to take care of herself in ways other than meds.  She has been seeing a therapist and says "I feel like he's helping me."  "We talked some things out, and he's given me some tools."  Notes she hasn't been exercising that much due to her hip issue.  Says she likes to walk but trying not to walk a whole lot due  to her hip.    Still trying to handle business, insurance, bills since the death of her husband.  Says when she takes the Prozac, she has no desire to really do anything.  Repeats "I just think I'm getting better.  I don't think I'm getting any worse; I just feel like when I take the medication, it's kind of like I'm in a fog."  Says she feels a little lightheaded but her BP hasn't been bad at all, 102-120/70's.  Her resting heart rate remains in the 80's or 90's, "but not in the 100's like it was."  Feels a little bit of depression, "but I don't feel as depressed as I felt two weeks ago."  Says "I still feel sad, but not as sad as I felt before.  It hasn't gotten any worse."  Says trying to get out of the house and "do something" every day, even if she just walks around [employer].  Says she was formerly lying in bed and doing nothing all day, and it once felt like the "walls were closing in around her."  Got out in her yard yesterday and notes "that did help me feel better."  Says "I feel better when I'm out and about and talking to someone," and "I know it's a process."  Support System Has been able to visit her son and the new baby; notes she wishes to visit her daughter, but is afraid  to get on a plane and travel.  Feels her friends are her relatives, her cousins, her female cousins.  Notes "they call me almost every single day; at least one person calls me every day."  Says that two of her cousins have lost their husbands and she calls them often with concerns.  They help her with advice and talk her through her concerns based on their own experiences.  Sleep Habits Says it doesn't matter what time she goes to bed; at first she was so exhausted from not sleeping the night before, she would go to bed at 7 o'clock and still wake up at 11 "thinking she slept the whole night."  Says most nights she twists and turns and dozes off and is "never fully asleep."  Says she's thinking about her  husband mainly, and goals and dreams they had planned and things they had always wished for which now they can never do, because he's gone.  Says she does plan to address that with her therapist.  Says "when I take that Ambien, I'm like a zombie the next day."  Has tried sleepy time tea, benadryl; "it does not matter what I do, I cannot sleep a full 3-4 hours without waking up."  She has tried melatonin OTC but it didn't work, period.  Cristina Conner several years ago and says "I did get a good night's rest and did not feel drugged up the next day.  Says "I think if I get some sleep I will be a whole lot better."    Depression screen [facility] 2/9 04/29/2019 04/15/2019 04/01/2019 09/09/2018 03/04/2018  Decreased Interest 1 3 3  0 0  Down, Depressed, Hopeless 0 3 3 0 0  PHQ - 2 Score 1 6 6  0 0  Altered sleeping 3 3 3 1  0  Tired, decreased energy 1 3 3  0 0  Change in appetite 1 1 2  0 0  Feeling bad or failure about yourself  0 1 1 0 0  Trouble concentrating 0 1 0 0 0  Moving slowly or fidgety/restless 0 1 0 0 0  Suicidal thoughts 0 0 0 0 0  PHQ-9 Score 6 16 15 1  0  Difficult doing work/chores Somewhat difficult Very difficult Very difficult - Not difficult at all    GAD 7 : Generalized Anxiety Score 04/15/2019 04/01/2019  Nervous, Anxious, on Edge 2 3  Control/stop worrying 2 3  Worry too much - different things 2 3  Trouble relaxing 2 3  Restless 1 1  Easily annoyed or irritable 2 3  Afraid - awful might happen 3 2  Total GAD 7 Score 14 18  Anxiety Difficulty Very difficult Very difficult     Impression and Recommendations:    1. Reactive depression   2. Death of family member-  husband passed   3. Sleep difficulties     Lifestyle Goals: 1. 10-15 minutes of exercise daily.  Reactive Depression - Grief after Death of Husband - Managed on 20 mg Prozac. - Per pt, feeling increasing anxiety after taking it.  - Discussed need to decrease medication. - Reduce dose of Prozac to 10 from 20  prior. - Advised patient that these medications increase our motivation and energy levels, but need to fine-tune to find balance between just enough energy and too much/anxiety.  - Discussed choosing Prozac at first because it enters the system quickly, and patient was dealing with acute grief after the death of her husband. - Reviewed necessity of adjusting  dosage and changing medicine in the future PRN until patient's ideal treatment is discovered.  - In addition to prescription intervention, extensively reviewed the "spokes of the wheel" of mood and health management.  Stressed the importance of ongoing prudent habits, including regular exercise, follow-up with therapist, appropriate sleep hygiene, healthful dietary habits, and prayer/meditation to relax.  - Encouraged patient to continue reaching out to her family and support system.  - Will continue to monitor.  Sleep Difficulties - Per pt, feels the Ambien is too much. - Reports "feeling like a zombie" the next day after taking it. - Ambien discontinued.  - Per pt, has tried melatonin, sleepy time tea, etc., to no relief. - Per pt, Lunesta worked well for her in the past and she did not feel "drugged." - Low dose Lunesta prescribed today. - Discussed use of 1-2 tablets PRN for sleep.  See med list. - patient knows to reduce dose to half tablet if one tablet is too much.  - Sleep hygiene reviewed. - Discussed need for patient to be in bed and prepared for sleep before taking sleep aid.  Health Counseling & Preventative Health Maintenance - Advised patient to continue working toward exercising to improve overall mental, physical, and emotional health.    - Encouraged patient to engage in daily physical activity, especially a formal exercise routine.  Recommended that the patient eventually strive for at least 150 minutes of moderate cardiovascular activity per week according to guidelines established by the [facility].   - Healthy dietary  habits encouraged, including low-carb, and high amounts of lean protein in diet.   - Patient should also consume adequate amounts of water.   - As part of my medical decision making, I reviewed the following data within the [place] History obtained from pt /family, CMA notes reviewed and incorporated if applicable, Labs reviewed, Radiograph/ tests reviewed if applicable and OV notes from prior OV's with me, as well as other specialists she/he has seen since seeing me last, were all reviewed and used in my medical decision making process today.   - Additionally, discussion had with patient regarding txmnt plan, their biases about that plan etc were used in my medical decision making today.   - The patient agreed with the plan and demonstrated an understanding of the instructions.   No barriers to understanding were identified.   - Red flag symptoms and signs discussed in detail.  Patient expressed understanding regarding what to do in case of emergency\ urgent symptoms.  The patient was advised to call back or seek an in-person evaluation if the symptoms worsen or if the condition fails to improve as anticipated.   Return for 2-4wks - dec prozac and change from [place] to [place].     Meds ordered this encounter  Medications  . eszopiclone (LUNESTA) 1 MG TABS tablet    Sig: 1-2 prn sleep before bedtime    Dispense:  90 tablet    Refill:  0  . FLUoxetine (PROZAC) 10 MG capsule    Sig: Take 1 capsule daily    Dispense:  90 capsule    Refill:  0    Medications Discontinued During This Encounter  Medication Reason  . zolpidem (AMBIEN CR) 6.25 MG CR tablet Change in therapy  . FLUoxetine (PROZAC) 10 MG capsule       I provided 22+ minutes of face-to-face time during this encounter.  Additional time was spent with charting and coordination of care after the actual visit  commenced.   Note:  This note was prepared with assistance of Dragon voice recognition software.  Occasional wrong-word or sound-a-like substitutions may have occurred due to the inherent limitations of voice recognition software.  This document serves as a record of services personally performed by Mellody Dance, DO. It was created on her behalf by [name], a trained medical scribe. The creation of this record is based on the scribe's personal observations and the provider's statements to them.   I have reviewed the above medical documentation for accuracy and completeness and I concur.  Mellody Dance, DO 04/29/2019 11:00 AM      Patient Care Team    Relationship Specialty Notifications Start End  Mellody Dance, DO PCP - General Family Medicine  2018/01/24   Druscilla Brownie, MD Consulting Physician Dermatology  2018/01/24   Opthamology, [facility]  Ophthalmology  01/24/2018   Princess Bruins, MD Consulting Physician Obstetrics and Gynecology  03/04/18     -Vitals obtained; medications/ allergies reconciled;  personal medical, social, Sx etc.histories were updated by CMA, reviewed by me and are reflected in chart  Patient Active Problem List   Diagnosis Date Noted  . Pre-diabetes 03/09/2018    Priority: High  . Hypertension 05/20/2011    Priority: High  . Osteopenia 07/31/2016    Priority: Medium  . Postmenopausal state- went thru early 40's 24-Jan-2018    Priority: Low  . Death of family member-  husband passed 04/29/2019  . 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 Sleep difficulties 09/09/2018  . Primary osteoarthritis of left hip 01-24-2018  . Caregiver burden- for husband with ALLeukemia 2018-01-24  . Left hip pain 01/24/2018  . Menopausal state 05/20/2011     Current Meds  Medication Sig  . calcium citrate (CALCITRATE - DOSED IN MG ELEMENTAL CALCIUM) 950 MG tablet Take 200 mg of elemental calcium by mouth daily.  . cholecalciferol (VITAMIN D3) 25 MCG (1000 UT) tablet Take 1,000 Units by mouth daily.  Marland Kitchen FLUoxetine (PROZAC) 10 MG capsule Take 1 capsule daily  .  lisinopril-hydrochlorothiazide (PRINZIDE,ZESTORETIC) 20-12.5 MG tablet TAKE 1 TABLET BY MOUTH EVERY DAY  . meloxicam (MOBIC) 15 MG tablet TAKE 1/2 TO 1 TABLET DAILY AS NEEDED FOR PAIN  . Misc Natural Products (TART CHERRY ADVANCED PO) Take 1 capsule by mouth daily.  . TURMERIC PO Take 1 tablet by mouth daily.  . [DISCONTINUED] FLUoxetine (PROZAC) 10 MG capsule Take 1 capsule daily for 8 days then 2 capsules daily     No Known Allergies   ROS:  See above HPI for pertinent positives and negatives   Objective:   Blood pressure 107/76, pulse 97, height 5\' 6"  (1.676 m), weight 144 lb 3.2 oz (65.4 kg), last menstrual period 05/01/2009.  (if some vitals are omitted, this means that patient was UNABLE to obtain them even though they were asked to get them prior to OV today.  They were asked to call us at their earliest convenience with these once obtained.) General: A & O * 3; visually in no acute distress; in usual state of health.  Skin: Visible skin appears normal and pt's usual skin color HEENT:  EOMI, head is normocephalic and atraumatic.  Sclera are anicteric. Neck has a good range of motion.  Lips are noncyanotic Chest: normal chest excursion and movement Respiratory: speaking in full sentences, no conversational dyspnea; no use of accessory muscles Psych: insight good, mood- appears full

## 2019-05-04 ENCOUNTER — Telehealth: Payer: Self-pay | Admitting: Family Medicine

## 2019-05-04 NOTE — Telephone Encounter (Signed)
Patient came in office, stating she has been sending [place] messages to provider and was looking for a response.    --- Forwarding to clinical pool --- AM

## 2019-05-04 NOTE — Telephone Encounter (Signed)
Advised pt that PA request has been submitted and that we are awaiting decision from insurance company.  Pt expressed understanding.  Charyl Bigger, CMA

## 2019-05-06 ENCOUNTER — Ambulatory Visit (INDEPENDENT_AMBULATORY_CARE_PROVIDER_SITE_OTHER): Payer: BC Managed Care – PPO | Admitting: Psychology

## 2019-05-06 DIAGNOSIS — F4323 Adjustment disorder with mixed anxiety and depressed mood: Secondary | ICD-10-CM | POA: Diagnosis not present

## 2019-05-08 ENCOUNTER — Other Ambulatory Visit: Payer: Self-pay | Admitting: Family Medicine

## 2019-05-13 ENCOUNTER — Encounter: Payer: Self-pay | Admitting: Family Medicine

## 2019-05-13 ENCOUNTER — Telehealth: Payer: Self-pay

## 2019-05-13 NOTE — Telephone Encounter (Signed)
Advised pt that PA was cancelled in Cover My Meds.  This usually means that either the insurance company doesn't cover the medication at all or that there is a larger copay for the medication.  Advised pt she should call her insurance company to inquire why this PA was cancelled.  Pt expressed understanding and will call back if further action is needed on on part.  Charyl Bigger, CMA

## 2019-05-20 ENCOUNTER — Encounter: Payer: Self-pay | Admitting: Family Medicine

## 2019-05-26 ENCOUNTER — Ambulatory Visit: Payer: BC Managed Care – PPO | Admitting: Psychology

## 2019-05-26 ENCOUNTER — Other Ambulatory Visit: Payer: Self-pay | Admitting: Family Medicine

## 2019-05-26 DIAGNOSIS — M1612 Unilateral primary osteoarthritis, left hip: Secondary | ICD-10-CM

## 2019-06-02 NOTE — Progress Notes (Signed)
Denial from [employer] [employer] that Eszopiclone was denied more than the max quantity allowed by plan was requested.

## 2019-06-03 ENCOUNTER — Telehealth: Payer: Self-pay | Admitting: Family Medicine

## 2019-06-03 NOTE — Telephone Encounter (Signed)
Rcvd medical clearance request form from [place] for patient--- Called pt to set up provider required OV/Appt for clearance --- Left pt message to call office  --FYI  --glh

## 2019-06-08 ENCOUNTER — Other Ambulatory Visit: Payer: Self-pay

## 2019-06-08 DIAGNOSIS — Z20822 Contact with and (suspected) exposure to covid-19: Secondary | ICD-10-CM

## 2019-06-10 LAB — NOVEL CORONAVIRUS, NAA: SARS-CoV-2, NAA: NOT DETECTED

## 2019-07-02 ENCOUNTER — Other Ambulatory Visit: Payer: Self-pay

## 2019-07-02 ENCOUNTER — Ambulatory Visit (INDEPENDENT_AMBULATORY_CARE_PROVIDER_SITE_OTHER): Payer: BC Managed Care – PPO | Admitting: Family Medicine

## 2019-07-02 ENCOUNTER — Encounter: Payer: Self-pay | Admitting: Family Medicine

## 2019-07-02 VITALS — BP 131/84 | HR 94 | Temp 98.2°F | Resp 10 | Ht 66.0 in | Wt 144.0 lb

## 2019-07-02 DIAGNOSIS — Z634 Disappearance and death of family member: Secondary | ICD-10-CM

## 2019-07-02 DIAGNOSIS — Z01818 Encounter for other preprocedural examination: Secondary | ICD-10-CM

## 2019-07-02 DIAGNOSIS — M1612 Unilateral primary osteoarthritis, left hip: Secondary | ICD-10-CM | POA: Diagnosis not present

## 2019-07-02 DIAGNOSIS — M25552 Pain in left hip: Secondary | ICD-10-CM

## 2019-07-02 DIAGNOSIS — I10 Essential (primary) hypertension: Secondary | ICD-10-CM | POA: Diagnosis not present

## 2019-07-02 DIAGNOSIS — F329 Major depressive disorder, single episode, unspecified: Secondary | ICD-10-CM

## 2019-07-02 DIAGNOSIS — R7303 Prediabetes: Secondary | ICD-10-CM

## 2019-07-02 DIAGNOSIS — G479 Sleep disorder, unspecified: Secondary | ICD-10-CM

## 2019-07-02 MED ORDER — FLUOXETINE HCL 20 MG PO TABS: 20.0000 mg | ORAL_TABLET | Freq: Every day | ORAL | 1 refills | Status: DC

## 2019-07-02 NOTE — Patient Instructions (Signed)
Cristina Conner please let us know about if they are giving you general anesthesia with with a tube down your throat versus an LMA with total IV anesthesia.  If you are getting general anesthesia with a endotracheal tube, then I recommend an EKG  Also please let me know how you do with increasing Prozac from 10-20.  I gave you a tablet of 20 mg so you may cut in half if need be.

## 2019-07-02 NOTE — Progress Notes (Signed)
Impression and Recommendations:    1. Preop examination   2. Primary osteoarthritis of left hip   3. Left hip pain   4. Essential hypertension   5. Pre-diabetes   6. Reactive depression   7. Sleep difficulties   8. Death of family member-  husband passed     Surgical Clearance - Total Left Hip Replacement - Per patient, going to undergo a regional anesthesia block and TIVA for total hip replacement. - Education provided today regarding clearance for surgery.  All questions answered.  - Per patient, has had different forms of anesthesia in the past and tolerated well.  - Discussed that if patient finds out she will be receiving general anesthesia, recommended obtaining EKG prior to surgery.  Encouraged patient to call her surgical provider for further information. Sx not scheduled until December.   - Lengthy discussion held with patient about expectations and risks.  - Reviewed need for lab work prior to surgery. - Discussed important screening labs, including CBC and CMP.    Hypertension - Stable at this time. - Reviewed goal BP with patient today. - Ongoing ambulatory BP monitoring encouraged. - Will continue to monitor.    Reactive Depression - not well controlled; pt struggling - Encouraged patient to double dose of Prozac for increased mood control. - New prescription of 20 mg tablets provided to patient today. - If the double dose makes the patient more anxious, she will reduce her dose back by half, and knows to call in to the clinic for additional assistance.  Recommended adding Buspar in the future PRN. - Will continue to monitor closely.   Recommendations - Return in near future for chronic follow-up and acute concerns PRN.   Orders Placed This Encounter  Procedures  . CBC with Differential/Platelet  . Comprehensive metabolic panel  . Protime-INR    Meds ordered this encounter  Medications  . FLUoxetine (PROZAC) 20 MG tablet    Sig: Take 1  tablet (20 mg total) by mouth daily.    Dispense:  90 tablet    Refill:  1    Medications Discontinued During This Encounter  Medication Reason  . FLUoxetine (PROZAC) 10 MG capsule Dose change     Gross side effects, risk and benefits, and alternatives of medications and treatment plan in general discussed with patient.  Patient is aware that all medications have potential side effects and we are unable to predict every side effect or drug-drug interaction that may occur.   Patient will call with any questions prior to using medication if they have concerns.    Expresses verbal understanding and consents to current therapy and treatment regimen.  No barriers to understanding were identified.  Red flag symptoms and signs discussed in detail.  Patient expressed understanding regarding what to do in case of emergency\urgent symptoms  Please see AVS handed out to patient at the end of our visit for further patient instructions/ counseling done pertaining to today's office visit.   Return for 4-6 weeks for increased Prozac, and physical exam w/ FBW.     Note:  This note was prepared with assistance of Dragon voice recognition software. Occasional wrong-word or sound-a-like substitutions may have occurred due to the inherent limitations of voice recognition software.   This document serves as a record of services personally performed by Mellody Dance, DO. It was created on her behalf by [name], a trained medical scribe. The creation of this record is based on the scribe's  personal observations and the provider's statements to them.   This case required medical decision making of at least moderate complexity. The above documentation has been reviewed to be accurate and was completed by Marjory Sneddon, D.O.       --------------------------------------------------------------------------------------------------------------------------------------------------------------------------------------------------------------------------------------------    Subjective:    Cristina Conner, am serving as scribe for Dr. Mellody Dance.  HPI: Cristina Conner is a 58 y.o. female who presents to [place] [facility] today for issues as discussed below.  - Reactive Depression Notes mornings are hard for her.  Feels she's been dealing with a lot of stress in the past week.  Confirms she has not been feeling up to her usual self.  - Need for Surgical Clearance Having her left hip replaced. Confirms having the whole hip replaced. Says "I may have to stay overnight and be released early in the morning."  She has had anesthesia before.  Says she's only ever been just "really drowsy" from it.  In the past, she has had a frozen shoulder surgery; "that was the most recent one."  She also had two C-sections for two kids; thinks "maybe had a 'block' for those."    Per patient, they are going to do a block and TIVA for her upcoming surgery.  Able to walk up a flight of stairs without chest pain and SOB. She is able to carry in bags of groceries from outside without issue. Notes "not doing much exercise" lately, but did a lot of exercise in the past. Thinks the last time she exercised purposefully was in March. Has been off track since [place] started. Denies heart palpitations, chest pain, dizziness, visual changes, exercise/exertion intolerance. Denies family history of S-E to general anesthesia, or problems tolerating it.  Not on anticoagulants; doesn't take aspirin or Advil every day.   Wt Readings from Last 3 Encounters:  07/02/19 144 lb (65.3 kg)  04/29/19 144 lb 3.2 oz (65.4 kg)  04/15/19 149 lb (67.6 kg)   BP Readings from Last 3 Encounters:  07/02/19 131/84  04/29/19 107/76   04/15/19 127/84   Pulse Readings from Last 3 Encounters:  07/02/19 94  04/29/19 97  04/15/19 84   BMI Readings from Last 3 Encounters:  07/02/19 23.24 kg/m  04/29/19 23.27 kg/m  04/15/19 24.05 kg/m     Patient Care Team    Relationship Specialty Notifications Start End  Mellody Dance, DO PCP - General Family Medicine  01-19-18   Druscilla Brownie, MD Consulting Physician Dermatology  2018/01/19   Opthamology, [facility]  Ophthalmology  19-Jan-2018   Princess Bruins, MD Consulting Physician Obstetrics and Gynecology  03/04/18      Patient Active Problem List   Diagnosis Date Noted  . Pre-diabetes 03/09/2018    Priority: High  . Hypertension 05/20/2011    Priority: High  . Osteopenia 07/31/2016    Priority: Medium  . Postmenopausal state- went thru early 40's 2018-01-19    Priority: Low  . Death of family member-  husband passed 04/29/2019  . 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 Sleep difficulties 09/09/2018  . Primary osteoarthritis of left hip 01/19/2018  . Caregiver burden- for husband with ALLeukemia 01-19-18  . Left hip pain 2018/01/19  . Menopausal state 05/20/2011    Past Medical history, Surgical history, Family history, Social history, Allergies and Medications have been entered into the medical record, reviewed and changed as needed.    Current Meds  Medication Sig  . calcium citrate (CALCITRATE - DOSED  IN MG ELEMENTAL CALCIUM) 950 MG tablet Take 200 mg of elemental calcium by mouth daily.  . cholecalciferol (VITAMIN D3) 25 MCG (1000 UT) tablet Take 1,000 Units by mouth daily.  . eszopiclone (LUNESTA) 1 MG TABS tablet 1-2 prn sleep before bedtime (Patient taking differently: 1-2 prn sleep before bedtime  Patient states only doing half tab)  . lisinopril-hydrochlorothiazide (ZESTORETIC) 20-12.5 MG tablet TAKE 1 TABLET BY MOUTH EVERY DAY  . meloxicam (MOBIC) 15 MG tablet TAKE 1/2 TO 1 TABLET DAILY AS NEEDED FOR PAIN  . Misc Natural Products (TART CHERRY  ADVANCED PO) Take 1 capsule by mouth daily.  . TURMERIC PO Take 1 tablet by mouth daily.  . [DISCONTINUED] FLUoxetine (PROZAC) 10 MG capsule Take 1 capsule daily    Allergies:  No Known Allergies   Review of Systems:  A fourteen system review of systems was performed and found to be positive as per HPI.   Objective:   Blood pressure 131/84, pulse 94, temperature 98.2 F (36.8 C), temperature source Oral, resp. rate 10, height 5\' 6"  (1.676 m), weight 144 lb (65.3 kg), last menstrual period 05/01/2009, SpO2 100 %. Body mass index is 23.24 kg/m. General:  Well Developed, well nourished, appropriate for stated age.  Neuro:  Alert and oriented,  extra-ocular muscles intact  HEENT:  Normocephalic, atraumatic, neck supple, no carotid bruits appreciated  Skin:  no gross rash, warm, pink. Cardiac:  RRR, S1 S2 Respiratory:  ECTA B/L and A/P, Not using accessory muscles, speaking in full sentences- unlabored. Vascular:  Ext warm, no cyanosis apprec.; cap RF less 2 sec. Psych:  No HI/SI, judgement and insight good, Euthymic mood. Full Affect.

## 2019-07-03 ENCOUNTER — Other Ambulatory Visit: Payer: Self-pay | Admitting: Family Medicine

## 2019-07-03 LAB — PROTIME-INR
INR: 1 (ref 0.9–1.2)
Prothrombin Time: 11 s (ref 9.1–12.0)

## 2019-07-15 ENCOUNTER — Encounter: Payer: Self-pay | Admitting: Family Medicine

## 2019-07-15 NOTE — Telephone Encounter (Signed)
Per Dr. Raliegh Scarlet last note if patient feels that the 20mg  prozac is too much she can cut the dose back down to 10mg . I spoke with patient and she said she 'just feels like she has too much medicine in her system' so I advised her per Dr. Daisy Floro last note that she could cut the Prozac back down to 10mg . Pt verbalized understanding. I asked pt if she was have in self harm or suicidal ideations and patient denied both. I advise patient that Dr. Jenetta Downer was out of the office until next week and that we could schedule her an apt to follow up with her then but that if she needed immediate help that she could go the the [facility] ED across the street from [place]. Patient stated that she did not need this help at this time but verbalized understanding and agreement that if she did need immediate assistance that she would seek help there. Call transferred to the front desk for scheduling.  Valetta Fuller, NP in office is aware of this situation. AS, CMA

## 2019-07-16 ENCOUNTER — Other Ambulatory Visit: Payer: Self-pay | Admitting: Orthopaedic Surgery

## 2019-07-19 ENCOUNTER — Ambulatory Visit (INDEPENDENT_AMBULATORY_CARE_PROVIDER_SITE_OTHER): Payer: BC Managed Care – PPO | Admitting: Family Medicine

## 2019-07-19 ENCOUNTER — Encounter: Payer: Self-pay | Admitting: Family Medicine

## 2019-07-19 ENCOUNTER — Other Ambulatory Visit: Payer: Self-pay

## 2019-07-19 DIAGNOSIS — G479 Sleep disorder, unspecified: Secondary | ICD-10-CM

## 2019-07-19 DIAGNOSIS — F329 Major depressive disorder, single episode, unspecified: Secondary | ICD-10-CM | POA: Diagnosis not present

## 2019-07-19 DIAGNOSIS — Z634 Disappearance and death of family member: Secondary | ICD-10-CM | POA: Diagnosis not present

## 2019-07-19 MED ORDER — ESZOPICLONE 1 MG PO TABS: 0.5000 mg | ORAL_TABLET | Freq: Every evening | ORAL | Status: DC | PRN

## 2019-07-19 MED ORDER — FLUOXETINE HCL 20 MG PO TABS: 10.0000 mg | ORAL_TABLET | Freq: Every day | ORAL | Status: DC

## 2019-07-19 MED ORDER — BUSPIRONE HCL 10 MG PO TABS: ORAL_TABLET | ORAL | 0 refills | Status: DC

## 2019-07-19 NOTE — Patient Instructions (Signed)
Generalized Anxiety Disorder, Adult Generalized anxiety disorder (GAD) is a mental health disorder. People with this condition constantly worry about everyday events. Unlike normal anxiety, worry related to GAD is not triggered by a specific event. These worries also do not fade or get better with time. GAD interferes with life functions, including relationships, work, and school. GAD can vary from mild to severe. People with severe GAD can have intense waves of anxiety with physical symptoms (panic attacks). What are the causes? The exact cause of GAD is not known. What increases the risk? This condition is more likely to develop in:  Women.  People who have a family history of anxiety disorders.  People who are very shy.  People who experience very stressful life events, such as the death of a loved one.  People who have a very stressful family environment. What are the signs or symptoms? People with GAD often worry excessively about many things in their lives, such as their health and family. They may also be overly concerned about:  Doing well at work.  Being on time.  Natural disasters.  Friendships. Physical symptoms of GAD include:  Fatigue.  Muscle tension or having muscle twitches.  Trembling or feeling shaky.  Being easily startled.  Feeling like your heart is pounding or racing.  Feeling out of breath or like you cannot take a deep breath.  Having trouble falling asleep or staying asleep.  Sweating.  Nausea, diarrhea, or irritable bowel syndrome (IBS).  Headaches.  Trouble concentrating or remembering facts.  Restlessness.  Irritability. How is this diagnosed? Your health care provider can diagnose GAD based on your symptoms and medical history. You will also have a physical exam. The health care provider will ask specific questions about your symptoms, including how severe they are, when they started, and if they come and go. Your health care  provider may ask you about your use of alcohol or drugs, including prescription medicines. Your health care provider may refer you to a mental health specialist for further evaluation. Your health care provider will do a thorough examination and may perform additional tests to rule out other possible causes of your symptoms. To be diagnosed with GAD, a person must have anxiety that:  Is out of his or her control.  Affects several different aspects of his or her life, such as work and relationships.  Causes distress that makes him or her unable to take part in normal activities.  Includes at least three physical symptoms of GAD, such as restlessness, fatigue, trouble concentrating, irritability, muscle tension, or sleep problems. Before your health care provider can confirm a diagnosis of GAD, these symptoms must be present more days than they are not, and they must last for six months or longer. How is this treated? The following therapies are usually used to treat GAD:  Medicine. Antidepressant medicine is usually prescribed for long-term daily control. Antianxiety medicines may be added in severe cases, especially when panic attacks occur.  Talk therapy (psychotherapy). Certain types of talk therapy can be helpful in treating GAD by providing support, education, and guidance. Options include: ? Cognitive behavioral therapy (CBT). People learn coping skills and techniques to ease their anxiety. They learn to identify unrealistic or negative thoughts and behaviors and to replace them with positive ones. ? Acceptance and commitment therapy (ACT). This treatment teaches people how to be mindful as a way to cope with unwanted thoughts and feelings. ? Biofeedback. This process trains you to manage your body's response (  physiological response) through breathing techniques and relaxation methods. You will work with a therapist while machines are used to monitor your physical symptoms.  Stress  management techniques. These include yoga, meditation, and exercise. A mental health specialist can help determine which treatment is best for you. Some people see improvement with one type of therapy. However, other people require a combination of therapies. Follow these instructions at home:  Take over-the-counter and prescription medicines only as told by your health care provider.  Try to maintain a normal routine.  Try to anticipate stressful situations and allow extra time to manage them.  Practice any stress management or self-calming techniques as taught by your health care provider.  Do not punish yourself for setbacks or for not making progress.  Try to recognize your accomplishments, even if they are small.  Keep all follow-up visits as told by your health care provider. This is important. Contact a health care provider if:  Your symptoms do not get better.  Your symptoms get worse.  You have signs of depression, such as: ? A persistently sad, cranky, or irritable mood. ? Loss of enjoyment in activities that used to bring you joy. ? Change in weight or eating. ? Changes in sleeping habits. ? Avoiding friends or family members. ? Loss of energy for normal tasks. ? Feelings of guilt or worthlessness. Get help right away if:  You have serious thoughts about hurting yourself or others. If you ever feel like you may hurt yourself or others, or have thoughts about taking your own life, get help right away. You can go to your nearest emergency department or call:  Your local emergency services (911 in the [place]  A suicide crisis helpline, such as the [facility] at (905) 321-1649. This is open 24 hours a day. Summary  Generalized anxiety disorder (GAD) is a mental health disorder that involves worry that is not triggered by a specific event.  People with GAD often worry excessively about many things in their lives, such as their health and  family.  GAD may cause physical symptoms such as restlessness, trouble concentrating, sleep problems, frequent sweating, nausea, diarrhea, headaches, and trembling or muscle twitching.  A mental health specialist can help determine which treatment is best for you. Some people see improvement with one type of therapy. However, other people require a combination of therapies. This information is not intended to replace advice given to you by your health care provider. Make sure you discuss any questions you have with your health care provider. Document Released: 12/14/2012 Document Revised: 08/01/2017 Document Reviewed: 07/09/2016 Elsevier Patient Education  2020 [employer]  After being diagnosed with an anxiety disorder, you may be relieved to know why you have felt or behaved a certain way. It is natural to also feel overwhelmed about the treatment ahead and what it will mean for your life. With care and support, you can manage this condition and recover from it. How to cope with anxiety Dealing with stress Stress is your body's reaction to life changes and events, both good and bad. Stress can last just a few hours or it can be ongoing. Stress can play a major role in anxiety, so it is important to learn both how to cope with stress and how to think about it differently. Talk with your health care provider or a counselor to learn more about stress reduction. He or she may suggest some stress reduction techniques, such as:  Music therapy. This can include creating or listening to music that you enjoy and that inspires you.  Mindfulness-based meditation. This involves being aware of your normal breaths, rather than trying to control your breathing. It can be done while sitting or walking.  Centering prayer. This is a kind of meditation that involves focusing on a word, phrase, or sacred image that is meaningful to you and that brings you peace.  Deep breathing. To  do this, expand your stomach and inhale slowly through your nose. Hold your breath for 3-5 seconds. Then exhale slowly, allowing your stomach muscles to relax.  Self-talk. This is a skill where you identify thought patterns that lead to anxiety reactions and correct those thoughts.  Muscle relaxation. This involves tensing muscles then relaxing them. Choose a stress reduction technique that fits your lifestyle and personality. Stress reduction techniques take time and practice. Set aside 5-15 minutes a day to do them. Therapists can offer training in these techniques. The training may be covered by some insurance plans. Other things you can do to manage stress include:  Keeping a stress diary. This can help you learn what triggers your stress and ways to control your response.  Thinking about how you respond to certain situations. You may not be able to control everything, but you can control your reaction.  Making time for activities that help you relax, and not feeling guilty about spending your time in this way. Therapy combined with coping and stress-reduction skills provides the best chance for successful treatment. Medicines Medicines can help ease symptoms. Medicines for anxiety include:  Anti-anxiety drugs.  Antidepressants.  Beta-blockers. Medicines may be used as the main treatment for anxiety disorder, along with therapy, or if other treatments are not working. Medicines should be prescribed by a health care provider. Relationships Relationships can play a big part in helping you recover. Try to spend more time connecting with trusted friends and family members. Consider going to couples counseling, taking family education classes, or going to family therapy. Therapy can help you and others better understand the condition. How to recognize changes in your condition Everyone has a different response to treatment for anxiety. Recovery from anxiety happens when symptoms decrease and  stop interfering with your daily activities at home or work. This may mean that you will start to:  Have better concentration and focus.  Sleep better.  Be less irritable.  Have more energy.  Have improved memory. It is important to recognize when your condition is getting worse. Contact your health care provider if your symptoms interfere with home or work and you do not feel like your condition is improving. Where to find help and support: You can get help and support from these sources:  Self-help groups.  Online and [employer].  A trusted spiritual leader.  Couples counseling.  Family education classes.  Family therapy. Follow these instructions at home:  Eat a healthy diet that includes plenty of vegetables, fruits, whole grains, low-fat dairy products, and lean protein. Do not eat a lot of foods that are high in solid fats, added sugars, or salt.  Exercise. Most adults should do the following: ? Exercise for at least 150 minutes each week. The exercise should increase your heart rate and make you sweat (moderate-intensity exercise). ? Strengthening exercises at least twice a week.  Cut down on caffeine, tobacco, alcohol, and other potentially harmful substances.  Get the right amount and quality of sleep. Most adults need 7-9 hours of sleep each  night.  Make choices that simplify your life.  Take over-the-counter and prescription medicines only as told by your health care provider.  Avoid caffeine, alcohol, and certain over-the-counter cold medicines. These may make you feel worse. Ask your pharmacist which medicines to avoid.  Keep all follow-up visits as told by your health care provider. This is important. Questions to ask your health care provider  Would I benefit from therapy?  How often should I follow up with a health care provider?  How long do I need to take medicine?  Are there any long-term side effects of my medicine?  Are there  any alternatives to taking medicine? Contact a health care provider if:  You have a hard time staying focused or finishing daily tasks.  You spend many hours a day feeling worried about everyday life.  You become exhausted by worry.  You start to have headaches, feel tense, or have nausea.  You urinate more than normal.  You have diarrhea. Get help right away if:  You have a racing heart and shortness of breath.  You have thoughts of hurting yourself or others. If you ever feel like you may hurt yourself or others, or have thoughts about taking your own life, get help right away. You can go to your nearest emergency department or call:  Your local emergency services (911 in the [place]  A suicide crisis helpline, such as the [facility] at 828-111-4882. This is open 24-hours a day. Summary  Taking steps to deal with stress can help calm you.  Medicines cannot cure anxiety disorders, but they can help ease symptoms.  Family, friends, and partners can play a big part in helping you recover from an anxiety disorder. This information is not intended to replace advice given to you by your health care provider. Make sure you discuss any questions you have with your health care provider. Document Released: 08/13/2016 Document Revised: 08/01/2017 Document Reviewed: 08/13/2016 Elsevier Patient Education  2020 [employer].

## 2019-07-19 NOTE — Progress Notes (Signed)
Impression and Recommendations:    1. Reactive depression   2. Sleep difficulties   3. Death of family member-  husband passed     Reactive Depression - Death of family member (husband) - Last came for pre-op OV, began 20 mg of Prozac.  - Discussed uses of Prozac for depression and anxiety / overall mood medicine. - Discussed that some meds are tailored more toward depression, others more toward anxiety. - Long conversation held with patient. Education provided and all questions answered.  - Reviewed that as patient's anxiety increased on higher dose of Prozac, recommended addition of second mood medicine such as Zoloft, Paxil, or Buspar.  - Reduce dose of Prozac to 10 mg.  See med list. - Reviewed prudent use of Prozac with patient today.  - Begin Buspar today.  See med list.  - Extensively discussed appropriate use of Buspar with patient today, including critical importance of taking it as prescribed to reach full effectiveness.  Reviewed that Buspar should help alleviate anxiety as a mood stabilizer.  - In addition to prescription intervention, reviewed need for therapy/counseling, and the "spokes of the wheel" of mood and health management.  Stressed the importance of ongoing prudent habits, including regular exercise, appropriate sleep hygiene, healthful dietary habits, and prayer/meditation to relax.  - Advised patient to call Behavioral Health and ask about transferring to therapists that specialize in grief and loss of loved ones.  Encouraged patient to have a visit with her former therapist while waiting to establish with transfer.  - Encouraged patient to call her pastor and ask about grief counseling as well.  - Advised seeking out local support groups for additional assistance.  - Will continue to monitor.  Sleeping Difficulties - Continues with 1/2 tablet Lunesta PRN for sleep. - Per patient, experiences grogginess on full tablet. - Stable on current  management.  See med list. - Reviewed prudent use with patient today.  - Encouraged patient to avoid caffeine and engage in prudent lifestyle habits to improve sleep hygiene.  - Will continue to monitor.  Lifestyle & Preventative Health Maintenance - Advised patient to continue working toward exercising to improve overall mental, physical, and emotional health.    - Encouraged patient to engage in daily physical activity, especially a formal exercise routine.  Recommended that the patient eventually strive for at least 150 minutes of moderate cardiovascular activity per week according to guidelines established by the [facility].   - Healthy dietary habits encouraged, including low-carb, and high amounts of lean protein in diet.   - Patient should also consume adequate amounts of water.  - Health counseling performed.  All questions answered.   Education and routine counseling performed. Handouts provided.  Follow-Up - Discussed return OV on 08/09/2019 as scheduled-   Medications Discontinued During This Encounter  Medication Reason   FLUoxetine (PROZAC) 20 MG tablet    eszopiclone (LUNESTA) 1 MG TABS tablet      Meds ordered this encounter  Medications   STARTED busPIRone (BUSPAR) 10 MG tablet    Sig: Take 0.5 tablets (5 mg total) by mouth 3 (three) times daily for 10 days, THEN 1 tablet (10 mg total) 3 (three) times daily.    Dispense:  255 tablet    Refill:  0   FLUoxetine (PROZAC) 20 MG tablet    Sig: Take 0.5 tablets (10 mg total) by mouth daily.   eszopiclone (LUNESTA) 1 MG TABS tablet    Sig: Take 0.5  tablets (0.5 mg total) by mouth at bedtime as needed for sleep.    Gross side effects, risk and benefits, and alternatives of medications and treatment plan in general discussed with patient.  Patient is aware that all medications have potential side effects and we are unable to predict every side effect or drug-drug interaction that may occur.   Patient will call with any  questions prior to using medication if they have concerns.  Expresses verbal understanding and consents to current therapy and treatment regimen.  No barriers to understanding were identified.  Red flag symptoms and signs discussed in detail.  Patient expressed understanding regarding what to do in case of emergency\urgent symptoms  Please see AVS handed out to patient at the end of our visit for further patient instructions/ counseling done pertaining to today's office visit.   Return for keep Dec f/up: started buspar, f/up psychology appt.     Note:  This document was prepared using Dragon voice recognition software and may include unintentional dictation errors.   This document serves as a record of services personally performed by Mellody Dance, DO. It was created on her behalf by [name], a trained medical scribe. The creation of this record is based on the scribe's personal observations and the provider's statements to them.   This case required medical decision making of at least moderate complexity. The above documentation has been reviewed to be accurate and was completed by Marjory Sneddon, D.O.    --------------------------------------------------------------------------------------------------------------------------------------------------------------------------------------------------------------------------------------------    Subjective:    Cristina Conner, am serving as scribe for Dr. Mellody Dance.  CC:  Chief Complaint  Patient presents with   Depression    HPI: Cristina Conner is a 58 y.o. female who presents to [place] [facility] today for follow-up of mood.   - Mood Management Says the "Prozac doesn't work for me."  Notes started taking the 20 mg and began feeling more anxious, more nervous, jittery.  "I feel like I'm shaking, but I look at my hands and I'm not shaking."  States when she's in bed at night, she  wakes up "rocking" on her side.  "Sometimes when I sit like this, my legs are shaking uncontrollably."  Notes she can control this if she tries, "but when I don't think about it, they start shaking again."  Patient stopped taking the 20 mg Prozac.  "Not rocking anymore and I don't feel like I'm doing this [shaking] as much."  Still nervous, anxious, jittery; denies anger toward others.  "I just want to get back to my normal self."  Notes not exercising much because of her hip.  She was seeing a therapist through [facility]; notes "he gave me some tools."  Says she tries to refer to those tools when she feels anxious, "but I feel like I continue to go back to worrying."  "It boils down to days that I have to deal with things to do with my husband."  "I get really sad and want to stay in bed."  Says she stopped talking to her counselor due to scheduling difficulties.  In addition, she was "feeling pretty good" at first but now "getting consistently worse" and thinks she should go back to counseling.  "I would love to talk to somebody who has maybe walked in my shoes."  - Insomnia Only takes half a tablet of Lunesta; notes "really really groggy on one full tablet, so I cut it in half."  Depression screen [facility] 2/9 07/19/2019 07/02/2019 04/29/2019  Decreased Interest 2 1 1   Down, Depressed, Hopeless 2 1 0  PHQ - 2 Score 4 2 1   Altered sleeping 2 1 3   Tired, decreased energy 2 0 1  Change in appetite 0 0 1  Feeling bad or failure about yourself  0 0 0  Trouble concentrating 0 0 0  Moving slowly or fidgety/restless 2 0 0  Suicidal thoughts 0 0 0  PHQ-9 Score 10 3 6   Difficult doing work/chores Somewhat difficult Somewhat difficult Somewhat difficult     GAD 7 : Generalized Anxiety Score 07/19/2019 04/15/2019 04/01/2019  Nervous, Anxious, on Edge 1 2 3   Control/stop worrying 1 2 3   Worry too much - different things 1 2 3   Trouble relaxing 3 2 3   Restless 1 1 1   Easily annoyed or irritable 1 2 3     Afraid - awful might happen 1 3 2   Total GAD 7 Score 9 14 18   Anxiety Difficulty Somewhat difficult Very difficult Very difficult     Wt Readings from Last 3 Encounters:  07/19/19 148 lb 4.8 oz (67.3 kg)  07/02/19 144 lb (65.3 kg)  04/29/19 144 lb 3.2 oz (65.4 kg)   BP Readings from Last 3 Encounters:  07/19/19 125/86  07/02/19 131/84  04/29/19 107/76   Pulse Readings from Last 3 Encounters:  07/19/19 (!) 109  07/02/19 94  04/29/19 97   BMI Readings from Last 3 Encounters:  07/19/19 23.94 kg/m  07/02/19 23.24 kg/m  04/29/19 23.27 kg/m         Patient Care Team    Relationship Specialty Notifications Start End  Mellody Dance, DO PCP - General Family Medicine  01-31-18   Druscilla Brownie, MD Consulting Physician Dermatology  January 31, 2018   Opthamology, [facility]  Ophthalmology  31-Jan-2018   Princess Bruins, MD Consulting Physician Obstetrics and Gynecology  03/04/18      Patient Active Problem List   Diagnosis Date Noted   Pre-diabetes 03/09/2018    Priority: High   Hypertension 05/20/2011    Priority: High   Osteopenia 07/31/2016    Priority: Medium   Postmenopausal state- went thru early 40's 01/31/18    Priority: Low   Death of family member-  husband passed 04/29/2019   Reactive depression 04/01/2019   Sleep difficulties 09/09/2018   Primary osteoarthritis of left hip 31-Jan-2018   Caregiver burden- for husband with ALLeukemia 01-31-2018   Left hip pain 2018/01/31   Menopausal state 05/20/2011    Past Medical history, Surgical history, Family history, Social history, Allergies and Medications have been entered into the medical record, reviewed and changed as needed.    Current Meds  Medication Sig   calcium citrate (CALCITRATE - DOSED IN MG ELEMENTAL CALCIUM) 950 MG tablet Take 200 mg of elemental calcium by mouth daily.   cholecalciferol (VITAMIN D3) 25 MCG (1000 UT) tablet Take 1,000 Units by mouth daily.   eszopiclone (LUNESTA) 1  MG TABS tablet Take 0.5 tablets (0.5 mg total) by mouth at bedtime as needed for sleep.   FLUoxetine (PROZAC) 20 MG tablet Take 0.5 tablets (10 mg total) by mouth daily.   lisinopril-hydrochlorothiazide (ZESTORETIC) 20-12.5 MG tablet TAKE 1 TABLET BY MOUTH EVERY DAY   meloxicam (MOBIC) 15 MG tablet TAKE 1/2 TO 1 TABLET DAILY AS NEEDED FOR PAIN   Misc Natural Products (TART CHERRY ADVANCED PO) Take 1 capsule by mouth daily.   TURMERIC PO Take 1 tablet by mouth daily.   [  DISCONTINUED] eszopiclone (LUNESTA) 1 MG TABS tablet 1-2 prn sleep before bedtime (Patient taking differently: 0.5 mg. 1-2 prn sleep before bedtime  Patient states only doing half tab)   [DISCONTINUED] FLUoxetine (PROZAC) 20 MG tablet Take 1 tablet (20 mg total) by mouth daily. (Patient taking differently: Take 10 mg by mouth daily. )    Allergies:  No Known Allergies   Review of Systems: Review of Systems: General:   No F/C, wt loss Pulm:   No DIB, SOB, pleuritic chest pain Card:  No CP, palpitations Abd:  No n/v/d or pain Ext:  No inc edema from baseline Psych: no SI/ HI    Objective:   Blood pressure 125/86, pulse (!) 109, temperature 97.6 F (36.4 C), temperature source Oral, resp. rate 10, height 5\' 6"  (1.676 m), weight 148 lb 4.8 oz (67.3 kg), last menstrual period 05/01/2009, SpO2 100 %. Body mass index is 23.94 kg/m. General:  Well Developed, well nourished, appropriate for stated age.  Neuro:  Alert and oriented,  extra-ocular muscles intact  HEENT:  Normocephalic, atraumatic, neck supple, no carotid bruits appreciated  Skin:  no gross rash, warm, pink. Cardiac:  RRR, S1 S2 Respiratory:  ECTA B/L and A/P, Not using accessory muscles, speaking in full sentences- unlabored. Vascular:  Ext warm, no cyanosis apprec.; cap RF less 2 sec. Psych:  No HI/SI, judgement and insight good, Euthymic mood. Full Affect.

## 2019-07-21 ENCOUNTER — Ambulatory Visit: Payer: BC Managed Care – PPO | Admitting: Family Medicine

## 2019-07-26 ENCOUNTER — Ambulatory Visit (INDEPENDENT_AMBULATORY_CARE_PROVIDER_SITE_OTHER): Payer: BC Managed Care – PPO | Admitting: Psychology

## 2019-07-26 DIAGNOSIS — F4323 Adjustment disorder with mixed anxiety and depressed mood: Secondary | ICD-10-CM

## 2019-08-09 ENCOUNTER — Other Ambulatory Visit: Payer: Self-pay

## 2019-08-09 ENCOUNTER — Encounter: Payer: Self-pay | Admitting: Family Medicine

## 2019-08-09 ENCOUNTER — Ambulatory Visit (INDEPENDENT_AMBULATORY_CARE_PROVIDER_SITE_OTHER): Payer: BC Managed Care – PPO | Admitting: Family Medicine

## 2019-08-09 DIAGNOSIS — G479 Sleep disorder, unspecified: Secondary | ICD-10-CM

## 2019-08-09 NOTE — Progress Notes (Signed)
Virtual / live video office visit note for [employer], D.O- Primary Care Physician at [facility]   I connected with current patient today and beyond visually recognizing the correct individual, I verified that I am speaking with the correct person using two identifiers.  . Location of the patient: Home . Location of the provider: Office Only the patient (+/- their family members at pt's discretion) and myself were participating in the encounter    - This visit type was conducted due to national recommendations for restrictions regarding the COVID-19 Pandemic (e.g. social distancing) in an effort to limit this patient's exposure and mitigate transmission in our community.  This format is felt to be most appropriate for this patient at this time.   - The patient did have access to video technology today  - No physical exam could be performed with this format, beyond that communicated to [place] by the patient/ family members as noted.   - Additionally my office staff/ schedulers discussed with the patient that there may be a monetary charge related to this service, depending on patient's medical insurance.   The patient expressed understanding, and agreed to proceed.      History of Present Illness: I, [name], am serving as scribe for Dr. Mellody Dance.  - Mood Management States "I feel so much better, I really do."  Notes she's still having some days where she feels a little down, but she has stopped taking all anxiety medication.  "I have decided to do meditation, prayer, listening to music, being more spiritual, and talking to the therapist, like you and my therapist suggested."  She is trying to redirect her thoughts to something positive, and "I just refuse to be stuck on medication."  Says "I don't really like taking a lot of medication, and I really do appreciate you trying to help me, and who knows, I might need it again in the future, but I'm really going to try  to do this on my own."  Confirms she is completely off of the Prozac and Buspar.  "I don't need any refills."  Says she only tried the Buspar for a week and "didn't feel any better."  Notes after speaking with a therapist and praying about it, she asked God to help her get through this without medication.  Reiterates "I do not want to be stuck on medications."  Notes she is relying on prayer to help her to push through those days when she feels more anxious or sad.  - Insomnia Continues on a half-tablet of Lunesta as needed.  States she hasn't been taking any of that in probably two weeks.  Says she has been trying to discontinue use of Lunesta as well.  Notes "because I'm able to go back to sleep, I don't feel I need to take the Lunesta.'  Says she has been trying t  - Hip Pain, managed on meloxicam, upcoming surgery Continues taking meloxicam due to her hip.  "I'm hoping I won't have to keep taking that after my hip surgery."  Her surgery is a week from tomorrow.  Is unsure when her pre-op evaluation is.  States she is going to call and contact the anesthesiologist today to discuss when she needs to obtain her labs and COVID test.     Depression screen [facility] 2/9 08/09/2019 07/19/2019 07/02/2019 04/29/2019 04/15/2019  Decreased Interest 1 2 1 1 3   Down, Depressed, Hopeless 0 2 1 0 3  PHQ -  2 Score 1 4 2 1 6   Altered sleeping 1 2 1 3 3   Tired, decreased energy 1 2 0 1 3  Change in appetite 0 0 0 1 1  Feeling bad or failure about yourself  0 0 0 0 1  Trouble concentrating 0 0 0 0 1  Moving slowly or fidgety/restless 0 2 0 0 1  Suicidal thoughts 0 0 0 0 0  PHQ-9 Score 3 10 3 6 16   Difficult doing work/chores Not difficult at all Somewhat difficult Somewhat difficult Somewhat difficult Very difficult  Some recent data might be hidden    GAD 7 : Generalized Anxiety Score 08/09/2019 07/19/2019 04/15/2019 04/01/2019  Nervous, Anxious, on Edge 0 1 2 3   Control/stop worrying 0 1 2 3   Worry  too much - different things 1 1 2 3   Trouble relaxing 0 3 2 3   Restless 0 1 1 1   Easily annoyed or irritable 0 1 2 3   Afraid - awful might happen 0 1 3 2   Total GAD 7 Score 1 9 14 18   Anxiety Difficulty Not difficult at all Somewhat difficult Very difficult Very difficult     Impression and Recommendations:    1. Sleep difficulties     Upcoming Surgery - Told patient to call and confirm requirements regarding her pre-op evaluation. - Advised patient to obtain blood work and other screenings as advised by pre-op. - Recommended patient obtain CBC and CMP prior to surgery.  - Told patient to return for CPE after sufficiently recovered from surgery. - Will continue to monitor.  Anxiety - Per patient, discontinued both Prozac and Buspar. - States her symptoms are well controlled with therapy and prayer.  - Reviewed the "spokes of the wheel" of mood and health management.  In addition to prudent use of prescription intervention and therapy/counseling, stressed the importance of ongoing prudent habits, including regular exercise, appropriate sleep hygiene, healthful dietary habits, and prayer/meditation to relax.  - Encouraged patient to continue following up with therapy/counseling, and coping skills as established.  - Will continue to monitor.  Sleep Difficulties - Managed on Lunesta PRN. - Per patient, able to sleep without assistance most nights.  - Told patient to only use Lunesta if needed. - Advised patient to avoid use of sleep aid if at all possible.  - Will continue to monitor.  Lifestyle & Preventative Health Maintenance - Advised patient to continue working toward exercising to improve overall mental, physical, and emotional health.    - Encouraged patient to engage in daily physical activity as tolerated, especially a formal exercise routine.  Recommended that the patient eventually strive for at least 150 minutes of moderate cardiovascular activity per week  according to guidelines established by the [facility].   - Healthy dietary habits encouraged, including low-carb, and high amounts of lean protein in diet.   - Patient should also consume adequate amounts of water.  - Health counseling performed.  All questions answered.  Recommendations - Last A1c obtained 11 months ago. - Last FLP obtained 7 of 2019. - Discussed need for full blood work and CPE near future.   - As part of my medical decision making, I reviewed the following data within the [place] History obtained from pt /family, CMA notes reviewed and incorporated if applicable, Labs reviewed, Radiograph/ tests reviewed if applicable and OV notes from prior OV's with me, as well as other specialists she/he has seen since seeing me last, were all reviewed and used  in my medical decision making process today.   - Additionally, discussion had with patient regarding txmnt plan, their biases about that plan etc were used in my medical decision making today.   - The patient agreed with the plan and demonstrated an understanding of the instructions.   No barriers to understanding were identified.   - Red flag symptoms and signs discussed in detail.  Patient expressed understanding regarding what to do in case of emergency\ urgent symptoms.  The patient was advised to call back or seek an in-person evaluation if the symptoms worsen or if the condition fails to improve as anticipated.   Return for f/up near future for FBW and CPE next 2-3 months.     Medications Discontinued During This Encounter  Medication Reason  . busPIRone (BUSPAR) 10 MG tablet   . FLUoxetine (PROZAC) 20 MG tablet      Note:  This note was prepared with assistance of Dragon voice recognition software. Occasional wrong-word or sound-a-like substitutions may have occurred due to the inherent limitations of voice recognition software.  This document serves as a record of services personally performed by  Mellody Dance, DO. It was created on her behalf by [name], a trained medical scribe. The creation of this record is based on the scribe's personal observations and the provider's statements to them.   This case required medical decision making of at least moderate complexity. The above documentation has been reviewed to be accurate and was completed by Marjory Sneddon, D.O.      Patient Care Team    Relationship Specialty Notifications Start End  Mellody Dance, DO PCP - General Family Medicine  01-08-2018   Druscilla Brownie, MD Consulting Physician Dermatology  01-08-2018   Opthamology, [facility]  Ophthalmology  08-Jan-2018   Princess Bruins, MD Consulting Physician Obstetrics and Gynecology  03/04/18   Melrose Nakayama, MD Consulting Physician Orthopedic Surgery  08/04/19     -Vitals obtained; medications/ allergies reconciled;  personal medical, social, Sx etc.histories were updated by CMA, reviewed by me and are reflected in chart  Patient Active Problem List   Diagnosis Date Noted  . Pre-diabetes 03/09/2018    Priority: High  . Hypertension 05/20/2011    Priority: High  . Osteopenia 07/31/2016    Priority: Medium  . Postmenopausal state- went thru early 40's 2018-01-08    Priority: Low  . Death of family member-  husband passed 04/29/2019  . 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 Sleep difficulties 09/09/2018  . Primary osteoarthritis of left hip 01/08/2018  . Caregiver burden- for husband with ALLeukemia 01/08/2018  . Left hip pain 08-Jan-2018  . Menopausal state 05/20/2011     Current Meds  Medication Sig  . calcium citrate (CALCITRATE - DOSED IN MG ELEMENTAL CALCIUM) 950 MG tablet Take 200 mg of elemental calcium by mouth daily.  . cholecalciferol (VITAMIN D3) 25 MCG (1000 UT) tablet Take 1,000 Units by mouth daily.  Marland Kitchen lisinopril-hydrochlorothiazide (ZESTORETIC) 20-12.5 MG tablet TAKE 1 TABLET BY MOUTH EVERY DAY  . meloxicam (MOBIC) 15 MG tablet TAKE 1/2 TO 1  TABLET DAILY AS NEEDED FOR PAIN  . Misc Natural Products (TART CHERRY ADVANCED PO) Take 1 capsule by mouth daily.  . TURMERIC PO Take 1 tablet by mouth daily.     No Known Allergies   ROS:  See above HPI for pertinent positives and negatives   Objective:   Height 5\' 6"  (1.676 m), weight 142 lb 3.2 oz (64.5 kg), last menstrual period 05/01/2009.  (  if some vitals are omitted, this means that patient was UNABLE to obtain them even though they were asked to get them prior to [place] today.  They were asked to call us at their earliest convenience with these once obtained.)  General: A & O * 3; visually in no acute distress; in usual state of health.  Skin: Visible skin appears normal and pt's usual skin color HEENT:  EOMI, head is normocephalic and atraumatic.  Sclera are anicteric. Neck has a good range of motion.  Lips are noncyanotic Chest: normal chest excursion and movement Respiratory: speaking in full sentences, no conversational dyspnea; no use of accessory muscles Psych: insight good, mood- appears full

## 2019-08-10 NOTE — Patient Instructions (Addendum)
DUE TO COVID-19 ONLY ONE VISITOR IS ALLOWED TO COME WITH YOU AND STAY IN THE WAITING ROOM ONLY DURING PRE OP AND PROCEDURE DAY OF SURGERY. THE 1 VISITOR MAY VISIT WITH YOU AFTER SURGERY IN YOUR PRIVATE ROOM DURING VISITING HOURS ONLY!   ONCE YOUR COVID TEST IS COMPLETED, PLEASE BEGIN THE QUARANTINE INSTRUCTIONS AS OUTLINED IN YOUR HANDOUT.                Cristina Conner    Your procedure is scheduled on: Tuesday 08/17/2019   Report to [facility] Main  Entrance    Report to Short Stay at   [place] AM     Call this number if you have problems the morning of surgery [phone]    Remember: Do not eat food  :After Midnight.     NO SOLID FOOD AFTER MIDNIGHT THE NIGHT PRIOR TO SURGERY. NOTHING BY MOUTH EXCEPT CLEAR LIQUIDS UNTIL  0430 am .     PLEASE FINISH ENSURE DRINK PER SURGEON ORDER  WHICH NEEDS TO BE COMPLETED AT   0430 am.   CLEAR LIQUID DIET   Foods Allowed                                                                     Foods Excluded  Coffee and tea, regular and decaf                             liquids that you cannot  Plain Jell-O any favor except red or purple                                           see through such as: Fruit ices (not with fruit pulp)                                     milk, soups, orange juice  Iced Popsicles                                    All solid food Carbonated beverages, regular and diet                                    Cranberry, grape and apple juices Sports drinks like Gatorade Lightly seasoned clear broth or consume(fat free) Sugar, honey syrup  Sample Menu Breakfast                                Lunch                                     Supper Cranberry juice                    Beef  broth                            Chicken broth Jell-O                                     Grape juice                           Apple juice Coffee or tea                        Jell-O                                      Popsicle                                                 Coffee or tea                        Coffee or tea  _____________________________________________________________________     BRUSH YOUR TEETH MORNING OF SURGERY AND RINSE YOUR MOUTH OUT, NO CHEWING GUM CANDY OR MINTS.     Take these medicines the morning of surgery with A SIP OF WATER: none                                 You may not have any metal on your body including hair pins and              piercings  Do not wear jewelry, make-up, lotions, powders or perfumes, deodorant             Do not wear nail polish on your fingernails.  Do not shave  48 hours prior to surgery.              Do not bring valuables to the hospital. [place].  Contacts, dentures or bridgework may not be worn into surgery.  Leave suitcase in the car. After surgery it may be brought to your room.                   Please read over the following fact sheets you were given: _____________________________________________________________________             [facility] - Preparing for Surgery Before surgery, you can play an important role.  Because skin is not sterile, your skin needs to be as free of germs as possible.  You can reduce the number of germs on your skin by washing with CHG (chlorahexidine gluconate) soap before surgery.  CHG is an antiseptic cleaner which kills germs and bonds with the skin to continue killing germs even after washing. Please DO NOT use if you have an allergy to CHG or antibacterial soaps.  If your skin becomes reddened/irritated stop using the CHG and inform your nurse when you arrive at Short Stay. Do not shave (including legs and underarms)  for at least 48 hours prior to the first CHG shower.  You may shave your face/neck. Please follow these instructions carefully:  1.  Shower with CHG Soap the night before surgery and the  morning of Surgery.  2.  If you choose to wash your hair,  wash your hair first as usual with your  normal  shampoo.  3.  After you shampoo, rinse your hair and body thoroughly to remove the  shampoo.                           4.  Use CHG as you would any other liquid soap.  You can apply chg directly  to the skin and wash                       Gently with a scrungie or clean washcloth.  5.  Apply the CHG Soap to your body ONLY FROM THE NECK DOWN.   Do not use on face/ open                           Wound or open sores. Avoid contact with eyes, ears mouth and genitals (private parts).                       Wash face,  Genitals (private parts) with your normal soap.             6.  Wash thoroughly, paying special attention to the area where your surgery  will be performed.  7.  Thoroughly rinse your body with warm water from the neck down.  8.  DO NOT shower/wash with your normal soap after using and rinsing off  the CHG Soap.                9.  Pat yourself dry with a clean towel.            10.  Wear clean pajamas.            11.  Place clean sheets on your bed the night of your first shower and do not  sleep with pets. Day of Surgery : Do not apply any lotions/deodorants the morning of surgery.  Please wear clean clothes to the hospital/surgery center.  FAILURE TO FOLLOW THESE INSTRUCTIONS MAY RESULT IN THE CANCELLATION OF YOUR SURGERY PATIENT SIGNATURE_________________________________  NURSE SIGNATURE__________________________________  ________________________________________________________________________   [name]  An incentive spirometer is a tool that can help keep your lungs clear and active. This tool measures how well you are filling your lungs with each breath. Taking long deep breaths may help reverse or decrease the chance of developing breathing (pulmonary) problems (especially infection) following:  A long period of time when you are unable to move or be active. BEFORE THE PROCEDURE   If the spirometer includes an  indicator to show your best effort, your nurse or respiratory therapist will set it to a desired goal.  If possible, sit up straight or lean slightly forward. Try not to slouch.  Hold the incentive spirometer in an upright position. INSTRUCTIONS FOR USE  1. Sit on the edge of your bed if possible, or sit up as far as you can in bed or on a chair. 2. Hold the incentive spirometer in an upright position. 3. Breathe out normally. 4. Place the mouthpiece in your mouth and seal your lips  tightly around it. 5. Breathe in slowly and as deeply as possible, raising the piston or the ball toward the top of the column. 6. Hold your breath for 3-5 seconds or for as long as possible. Allow the piston or ball to fall to the bottom of the column. 7. Remove the mouthpiece from your mouth and breathe out normally. 8. Rest for a few seconds and repeat Steps 1 through 7 at least 10 times every 1-2 hours when you are awake. Take your time and take a few normal breaths between deep breaths. 9. The spirometer may include an indicator to show your best effort. Use the indicator as a goal to work toward during each repetition. 10. After each set of 10 deep breaths, practice coughing to be sure your lungs are clear. If you have an incision (the cut made at the time of surgery), support your incision when coughing by placing a pillow or rolled up towels firmly against it. Once you are able to get out of bed, walk around indoors and cough well. You may stop using the incentive spirometer when instructed by your caregiver.  RISKS AND COMPLICATIONS  Take your time so you do not get dizzy or light-headed.  If you are in pain, you may need to take or ask for pain medication before doing incentive spirometry. It is harder to take a deep breath if you are having pain. AFTER USE  Rest and breathe slowly and easily.  It can be helpful to keep track of a log of your progress. Your caregiver can provide you with a simple table  to help with this. If you are using the spirometer at home, follow these instructions: [place] IF:   You are having difficultly using the spirometer.  You have trouble using the spirometer as often as instructed.  Your pain medication is not giving enough relief while using the spirometer.  You develop fever of 100.5 F (38.1 C) or higher. SEEK IMMEDIATE MEDICAL CARE IF:   You cough up bloody sputum that had not been present before.  You develop fever of 102 F (38.9 C) or greater.  You develop worsening pain at or near the incision site. MAKE SURE YOU:   Understand these instructions.  Will watch your condition.  Will get help right away if you are not doing well or get worse. Document Released: 12/30/2006 Document Revised: 11/11/2011 Document Reviewed: 03/02/2007 ExitCare Patient Information 2014 ExitCare, [place].   ________________________________________________________________________  WHAT IS A BLOOD TRANSFUSION? Blood Transfusion Information  A transfusion is the replacement of blood or some of its parts. Blood is made up of multiple cells which provide different functions.  Red blood cells carry oxygen and are used for blood loss replacement.  White blood cells fight against infection.  Platelets control bleeding.  Plasma helps clot blood.  Other blood products are available for specialized needs, such as hemophilia or other clotting disorders. BEFORE THE TRANSFUSION  Who gives blood for transfusions?   Healthy volunteers who are fully evaluated to make sure their blood is safe. This is blood bank blood. Transfusion therapy is the safest it has ever been in the practice of medicine. Before blood is taken from a donor, a complete history is taken to make sure that person has no history of diseases nor engages in risky social behavior (examples are intravenous drug use or sexual activity with multiple partners). The donor's travel history is screened  to minimize risk of transmitting infections, such as malaria.  The donated blood is tested for signs of infectious diseases, such as HIV and hepatitis. The blood is then tested to be sure it is compatible with you in order to minimize the chance of a transfusion reaction. If you or a relative donates blood, this is often done in anticipation of surgery and is not appropriate for emergency situations. It takes many days to process the donated blood. RISKS AND COMPLICATIONS Although transfusion therapy is very safe and saves many lives, the main dangers of transfusion include:   Getting an infectious disease.  Developing a transfusion reaction. This is an allergic reaction to something in the blood you were given. Every precaution is taken to prevent this. The decision to have a blood transfusion has been considered carefully by your caregiver before blood is given. Blood is not given unless the benefits outweigh the risks. AFTER THE TRANSFUSION  Right after receiving a blood transfusion, you will usually feel much better and more energetic. This is especially true if your red blood cells have gotten low (anemic). The transfusion raises the level of the red blood cells which carry oxygen, and this usually causes an energy increase.  The nurse administering the transfusion will monitor you carefully for complications. HOME CARE INSTRUCTIONS  No special instructions are needed after a transfusion. You may find your energy is better. Speak with your caregiver about any limitations on activity for underlying diseases you may have. SEEK MEDICAL CARE IF:   Your condition is not improving after your transfusion.  You develop redness or irritation at the intravenous (IV) site. SEEK IMMEDIATE MEDICAL CARE IF:  Any of the following symptoms occur over the next 12 hours:  Shaking chills.  You have a temperature by mouth above 102 F (38.9 C), not controlled by medicine.  Chest, back, or muscle  pain.  People around you feel you are not acting correctly or are confused.  Shortness of breath or difficulty breathing.  Dizziness and fainting.  You get a rash or develop hives.  You have a decrease in urine output.  Your urine turns a dark color or changes to pink, red, or brown. Any of the following symptoms occur over the next 10 days:  You have a temperature by mouth above 102 F (38.9 C), not controlled by medicine.  Shortness of breath.  Weakness after normal activity.  The white part of the eye turns yellow (jaundice).  You have a decrease in the amount of urine or are urinating less often.  Your urine turns a dark color or changes to pink, red, or brown. Document Released: 08/16/2000 Document Revised: 11/11/2011 Document Reviewed: 04/04/2008 [facility] Patient Information 2014 [place], [place].  _______________________________________________________________________

## 2019-08-11 NOTE — H&P (Signed)
TOTAL HIP ADMISSION H&P  Patient is admitted for left total hip arthroplasty.  Subjective:  Chief Complaint: left hip pain  HPI: Cristina Conner, 58 y.o. female, has a history of pain and functional disability in the left hip(s) due to arthritis and patient has failed non-surgical conservative treatments for greater than 12 weeks to include NSAID's and/or analgesics, corticosteriod injections, flexibility and strengthening excercises, use of assistive devices, weight reduction as appropriate and activity modification.  Onset of symptoms was gradual starting 5 years ago with gradually worsening course since that time.The patient noted no past surgery on the left hip(s).  Patient currently rates pain in the left hip at 10 out of 10 with activity. Patient has night pain, worsening of pain with activity and weight bearing, trendelenberg gait, pain that interfers with activities of daily living and crepitus. Patient has evidence of subchondral cysts, subchondral sclerosis, periarticular osteophytes and joint space narrowing by imaging studies. This condition presents safety issues increasing the risk of falls. There is no current active infection.  Patient Active Problem List   Diagnosis Date Noted  . Death of family member-  husband passed 04/29/2019  . 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 Sleep difficulties 09/09/2018  . Pre-diabetes 03/09/2018  . Postmenopausal state- went thru early 40's 01/02/2018  . Primary osteoarthritis of left hip 01/02/2018  . Caregiver burden- for husband with ALLeukemia 01/02/2018  . Left hip pain 01/02/2018  . Osteopenia 07/31/2016  . Hypertension 05/20/2011  . Menopausal state 05/20/2011   Past Medical History:  Diagnosis Date  . Hypertension   . Osteopenia   . Post-operative nausea and vomiting    vomiting after general anesthesia per pt.    Past Surgical History:  Procedure Laterality Date  . CESAREAN SECTION     x 2  . ENDOMETRIAL ABLATION    . PELVIC  LAPAROSCOPY  1999   left salpingectomy, excision of Right, paratubal cyst  . PELVIC LAPAROSCOPY     laparoscopy with lysis of pelvic adhesions  . SHOULDER SURGERY  11/2010   "FROZEN SHOULDER"  . TUBAL LIGATION      No current facility-administered medications for this encounter.    Current Outpatient Medications  Medication Sig Dispense Refill Last Dose  . calcium citrate (CALCITRATE - DOSED IN MG ELEMENTAL CALCIUM) 950 MG tablet Take 200 mg of elemental calcium by mouth daily.     . cholecalciferol (VITAMIN D3) 25 MCG (1000 UT) tablet Take 1,000 Units by mouth daily.     . eszopiclone (LUNESTA) 1 MG TABS tablet Take 0.5 tablets (0.5 mg total) by mouth at bedtime as needed for sleep.     Marland Kitchen lisinopril-hydrochlorothiazide (ZESTORETIC) 20-12.5 MG tablet TAKE 1 TABLET BY MOUTH EVERY DAY (Patient taking differently: Take 1 tablet by mouth daily. ) 90 tablet 0   . meloxicam (MOBIC) 15 MG tablet TAKE 1/2 TO 1 TABLET DAILY AS NEEDED FOR PAIN (Patient taking differently: Take 15 mg by mouth daily. ) 90 tablet 1   . Misc Natural Products (TART CHERRY ADVANCED PO) Take 1 capsule by mouth daily.     . TURMERIC PO Take 1 tablet by mouth daily.      No Known Allergies  Social History   Tobacco Use  . Smoking status: Never Smoker  . Smokeless tobacco: Never Used  Substance Use Topics  . Alcohol use: Not Currently    Family History  Problem Relation Age of Onset  . Hypertension Mother   . Heart disease Mother   .  Diabetes Mother   . Hypertension Father   . Colon cancer Neg Hx      Review of Systems  Musculoskeletal: Positive for joint pain.       Left hip  All other systems reviewed and are negative.   Objective:  Physical Exam  Constitutional: She is oriented to person, place, and time. She appears well-developed and well-nourished.  HENT:  Head: Normocephalic and atraumatic.  Eyes: Pupils are equal, round, and reactive to light.  Neck: Normal range of motion.  Cardiovascular:  Normal rate.  Respiratory: Effort normal.  GI: Soft.  Musculoskeletal:     Comments: Left hip motion is extremely limited.  This is painful in internal rotation.  She walks with an altered gait.  Opposite hip moves well.  Sensation and motor function are intact in her feet with palpable pulses on both sides.    Neurological: She is alert and oriented to person, place, and time.  Skin: Skin is warm and dry.  Psychiatric: She has a normal mood and affect. Her behavior is normal. Judgment and thought content normal.    Vital signs in last 24 hours:    Labs:   Estimated body mass index is 22.95 kg/m as calculated from the following:   Height as of 08/09/19: 5\' 6"  (1.676 m).   Weight as of 08/09/19: 64.5 kg.   Imaging Review Plain radiographs demonstrate severe degenerative joint disease of the left hip(s). The bone quality appears to be good for age and reported activity level.   Assessment/Plan:  End stage primary arthritis, left hip(s)  The patient history, physical examination, clinical judgement of the provider and imaging studies are consistent with end stage degenerative joint disease of the left hip(s) and total hip arthroplasty is deemed medically necessary. The treatment options including medical management, injection therapy, arthroscopy and arthroplasty were discussed at length. The risks and benefits of total hip arthroplasty were presented and reviewed. The risks due to aseptic loosening, infection, stiffness, dislocation/subluxation,  thromboembolic complications and other imponderables were discussed.  The patient acknowledged the explanation, agreed to proceed with the plan and consent was signed. Patient is being admitted for inpatient treatment for surgery, pain control, PT, OT, prophylactic antibiotics, VTE prophylaxis, progressive ambulation and ADL's and discharge planning.The patient is planning to be discharged home with home health services

## 2019-08-13 ENCOUNTER — Other Ambulatory Visit (HOSPITAL_COMMUNITY)
Admission: RE | Admit: 2019-08-13 | Discharge: 2019-08-13 | Disposition: A | Discharge status: Home / Self Care | Payer: BC Managed Care – PPO | Source: Ambulatory Visit | Attending: Orthopaedic Surgery | Admitting: Orthopaedic Surgery

## 2019-08-13 ENCOUNTER — Encounter (HOSPITAL_COMMUNITY)
Admission: RE | Admit: 2019-08-13 | Discharge: 2019-08-13 | Disposition: A | Discharge status: Home / Self Care | Payer: BC Managed Care – PPO | Source: Ambulatory Visit | Attending: Orthopaedic Surgery | Admitting: Orthopaedic Surgery

## 2019-08-13 ENCOUNTER — Encounter (HOSPITAL_COMMUNITY): Payer: Self-pay

## 2019-08-13 ENCOUNTER — Other Ambulatory Visit: Payer: Self-pay

## 2019-08-13 ENCOUNTER — Ambulatory Visit (HOSPITAL_COMMUNITY)
Admission: RE | Admit: 2019-08-13 | Discharge: 2019-08-13 | Disposition: A | Discharge status: Home / Self Care | Payer: BC Managed Care – PPO | Source: Ambulatory Visit | Attending: Orthopaedic Surgery | Admitting: Orthopaedic Surgery

## 2019-08-13 DIAGNOSIS — M1612 Unilateral primary osteoarthritis, left hip: Secondary | ICD-10-CM | POA: Insufficient documentation

## 2019-08-13 DIAGNOSIS — Z20828 Contact with and (suspected) exposure to other viral communicable diseases: Secondary | ICD-10-CM | POA: Diagnosis not present

## 2019-08-13 DIAGNOSIS — Z01818 Encounter for other preprocedural examination: Secondary | ICD-10-CM | POA: Diagnosis not present

## 2019-08-13 HISTORY — DX: Prediabetes: R73.03

## 2019-08-13 HISTORY — DX: Unspecified osteoarthritis, unspecified site: M19.90

## 2019-08-13 LAB — PROTIME-INR
INR: 1 (ref 0.8–1.2)
Prothrombin Time: 12.8 seconds (ref 11.4–15.2)

## 2019-08-13 LAB — CBC WITH DIFFERENTIAL/PLATELET
Abs Immature Granulocytes: 0.02 10*3/uL (ref 0.00–0.07)
Basophils Absolute: 0.1 10*3/uL (ref 0.0–0.1)
Basophils Relative: 1 %
Eosinophils Absolute: 1.4 10*3/uL — ABNORMAL HIGH (ref 0.0–0.5)
Eosinophils Relative: 15 %
HCT: 37.8 % (ref 36.0–46.0)
Hemoglobin: 11.9 g/dL — ABNORMAL LOW (ref 12.0–15.0)
Immature Granulocytes: 0 %
Lymphocytes Relative: 20 %
Lymphs Abs: 1.9 10*3/uL (ref 0.7–4.0)
MCH: 27.2 pg (ref 26.0–34.0)
MCHC: 31.5 g/dL (ref 30.0–36.0)
MCV: 86.5 fL (ref 80.0–100.0)
Monocytes Absolute: 0.6 10*3/uL (ref 0.1–1.0)
Monocytes Relative: 6 %
Neutro Abs: 5.6 10*3/uL (ref 1.7–7.7)
Neutrophils Relative %: 58 %
Platelets: 347 10*3/uL (ref 150–400)
RBC: 4.37 MIL/uL (ref 3.87–5.11)
RDW: 13.3 % (ref 11.5–15.5)
WBC: 9.5 10*3/uL (ref 4.0–10.5)
nRBC: 0 % (ref 0.0–0.2)

## 2019-08-13 LAB — BASIC METABOLIC PANEL
Anion gap: 8 (ref 5–15)
BUN: 19 mg/dL (ref 6–20)
CO2: 26 mmol/L (ref 22–32)
Calcium: 9 mg/dL (ref 8.9–10.3)
Chloride: 106 mmol/L (ref 98–111)
Creatinine, Ser: 0.88 mg/dL (ref 0.44–1.00)
GFR calc Af Amer: >60 mL/min (ref >60)
GFR calc non Af Amer: >60 mL/min (ref >60)
Glucose, Bld: 114 mg/dL — ABNORMAL HIGH (ref 70–99)
Potassium: 4 mmol/L (ref 3.5–5.1)
Sodium: 140 mmol/L (ref 135–145)

## 2019-08-13 LAB — SURGICAL PCR SCREEN
MRSA, PCR: NEGATIVE
Staphylococcus aureus: NEGATIVE

## 2019-08-13 LAB — URINALYSIS, ROUTINE W REFLEX MICROSCOPIC
Bilirubin Urine: NEGATIVE
Glucose, UA: NEGATIVE mg/dL
Hgb urine dipstick: NEGATIVE
Ketones, ur: NEGATIVE mg/dL
Leukocytes,Ua: NEGATIVE
Nitrite: NEGATIVE
Protein, ur: NEGATIVE mg/dL
Specific Gravity, Urine: 1.013 (ref 1.005–1.030)
pH: 7 (ref 5.0–8.0)

## 2019-08-13 LAB — APTT: aPTT: 27 seconds (ref 24–36)

## 2019-08-13 LAB — HEMOGLOBIN A1C
Hgb A1c MFr Bld: 5.7 % — ABNORMAL HIGH (ref 4.8–5.6)
Mean Plasma Glucose: 116.89 mg/dL

## 2019-08-13 LAB — ABO/RH: ABO/RH(D): AB POS

## 2019-08-13 NOTE — Progress Notes (Signed)
PCP - Dr. Mellody Dance Cardiologist - n/a  Chest x-ray - 08/13/2019 EPIC EKG - 08/13/2019 EPIC Stress Test -n/a  ECHO - n/a Cardiac Cath - n/a  Sleep Study - n/a CPAP -n/a   Fasting Blood Sugar - Pre-Diabetes Checks Blood Sugar __0___ times a day  Blood Thinner Instructions:n/a Aspirin Instructions:n/a Last Dose:n/a  Anesthesia review:  Patient has a history of HTN and Pre-Diabetes.  Patient denies shortness of breath, fever, cough and chest pain at PAT appointment   Patient verbalized understanding of instructions that were given to them at the PAT appointment. Patient was also instructed that they will need to review over the PAT instructions again at home before surgery.

## 2019-08-14 LAB — NOVEL CORONAVIRUS, NAA (HOSP ORDER, SEND-OUT TO REF LAB; TAT 18-24 HRS): SARS-CoV-2, NAA: NOT DETECTED

## 2019-08-16 MED ORDER — TRANEXAMIC ACID 1000 MG/10ML IV SOLN
2000.0000 mg | INTRAVENOUS | Status: DC
  Filled 2019-08-16: qty 20

## 2019-08-16 MED ORDER — BUPIVACAINE LIPOSOME 1.3 % IJ SUSP
10.0000 mL | Freq: Once | INTRAMUSCULAR | Status: DC
  Filled 2019-08-16: qty 10

## 2019-08-16 NOTE — Anesthesia Preprocedure Evaluation (Addendum)
Anesthesia Evaluation  Patient identified by MRN, date of birth, ID band Patient awake    Reviewed: Allergy & Precautions, NPO status , Patient's Chart, lab work & pertinent test results  History of Anesthesia Complications (+) PONV and history of anesthetic complications  Airway Mallampati: I  TM Distance: >3 FB Neck ROM: Full    Dental no notable dental hx.    Pulmonary neg pulmonary ROS,    Pulmonary exam normal breath sounds clear to auscultation       Cardiovascular hypertension, Pt. on medications Normal cardiovascular exam Rhythm:Regular Rate:Normal     Neuro/Psych PSYCHIATRIC DISORDERS Depression negative neurological ROS     GI/Hepatic negative GI ROS, Neg liver ROS,   Endo/Other  negative endocrine ROS  Renal/GU negative Renal ROS     Musculoskeletal  (+) Arthritis ,   Abdominal   Peds  Hematology negative hematology ROS (+)   Anesthesia Other Findings LEFT HIP DEGENERATIVE JOINT DISEASE  Reproductive/Obstetrics                           Anesthesia Physical Anesthesia Plan  ASA: II  Anesthesia Plan: Spinal   Post-op Pain Management:    Induction: Intravenous  PONV Risk Score and Plan: 3 and Ondansetron, Dexamethasone, Propofol infusion, Midazolam and Treatment may vary due to age or medical condition  Airway Management Planned: Simple Face Mask  Additional Equipment:   Intra-op Plan:   Post-operative Plan:   Informed Consent: I have reviewed the patients History and Physical, chart, labs and discussed the procedure including the risks, benefits and alternatives for the proposed anesthesia with the patient or authorized representative who has indicated his/her understanding and acceptance.     Dental advisory given  Plan Discussed with: CRNA  Anesthesia Plan Comments:         Anesthesia Quick Evaluation

## 2019-08-17 ENCOUNTER — Ambulatory Visit (HOSPITAL_COMMUNITY): Payer: BC Managed Care – PPO | Admitting: Physician Assistant

## 2019-08-17 ENCOUNTER — Ambulatory Visit (HOSPITAL_COMMUNITY)
Admission: RE | Admit: 2019-08-17 | Discharge: 2019-08-17 | Disposition: A | Discharge status: Home / Self Care | Payer: BC Managed Care – PPO | Attending: Orthopaedic Surgery | Admitting: Orthopaedic Surgery

## 2019-08-17 ENCOUNTER — Ambulatory Visit (HOSPITAL_COMMUNITY): Payer: BC Managed Care – PPO | Admitting: Certified Registered Nurse Anesthetist

## 2019-08-17 ENCOUNTER — Ambulatory Visit (HOSPITAL_COMMUNITY): Payer: BC Managed Care – PPO

## 2019-08-17 ENCOUNTER — Encounter (HOSPITAL_COMMUNITY): Payer: Self-pay | Admitting: Orthopaedic Surgery

## 2019-08-17 ENCOUNTER — Encounter (HOSPITAL_COMMUNITY)
Admission: RE | Disposition: A | Discharge status: Home / Self Care | Payer: Self-pay | Source: Home / Self Care | Attending: Orthopaedic Surgery

## 2019-08-17 DIAGNOSIS — M8580 Other specified disorders of bone density and structure, unspecified site: Secondary | ICD-10-CM | POA: Insufficient documentation

## 2019-08-17 DIAGNOSIS — Z791 Long term (current) use of non-steroidal anti-inflammatories (NSAID): Secondary | ICD-10-CM | POA: Diagnosis not present

## 2019-08-17 DIAGNOSIS — Z8249 Family history of ischemic heart disease and other diseases of the circulatory system: Secondary | ICD-10-CM | POA: Diagnosis not present

## 2019-08-17 DIAGNOSIS — Z419 Encounter for procedure for purposes other than remedying health state, unspecified: Secondary | ICD-10-CM

## 2019-08-17 DIAGNOSIS — I10 Essential (primary) hypertension: Secondary | ICD-10-CM | POA: Insufficient documentation

## 2019-08-17 DIAGNOSIS — Z833 Family history of diabetes mellitus: Secondary | ICD-10-CM | POA: Insufficient documentation

## 2019-08-17 DIAGNOSIS — Z79899 Other long term (current) drug therapy: Secondary | ICD-10-CM | POA: Insufficient documentation

## 2019-08-17 DIAGNOSIS — R7303 Prediabetes: Secondary | ICD-10-CM | POA: Insufficient documentation

## 2019-08-17 DIAGNOSIS — M1612 Unilateral primary osteoarthritis, left hip: Secondary | ICD-10-CM | POA: Diagnosis present

## 2019-08-17 DIAGNOSIS — F329 Major depressive disorder, single episode, unspecified: Secondary | ICD-10-CM | POA: Insufficient documentation

## 2019-08-17 DIAGNOSIS — Z9079 Acquired absence of other genital organ(s): Secondary | ICD-10-CM | POA: Insufficient documentation

## 2019-08-17 HISTORY — PX: TOTAL HIP ARTHROPLASTY: SHX124

## 2019-08-17 LAB — TYPE AND SCREEN
ABO/RH(D): AB POS
Antibody Screen: NEGATIVE

## 2019-08-17 SURGERY — ARTHROPLASTY, HIP, TOTAL, ANTERIOR APPROACH
Anesthesia: Spinal | Site: Hip | Laterality: Left

## 2019-08-17 MED ORDER — MIDAZOLAM HCL 5 MG/5ML IJ SOLN
INTRAMUSCULAR | Status: DC | PRN
  Administered 2019-08-17: 2 mg via INTRAVENOUS

## 2019-08-17 MED ORDER — HYDROCODONE-ACETAMINOPHEN 5-325 MG PO TABS: 1.0000 | ORAL_TABLET | Freq: Four times a day (QID) | ORAL | 0 refills | Status: DC | PRN

## 2019-08-17 MED ORDER — PROPOFOL 500 MG/50ML IV EMUL
INTRAVENOUS | Status: AC
  Filled 2019-08-17: qty 50

## 2019-08-17 MED ORDER — POVIDONE-IODINE 10 % EX SWAB
2.0000 "application " | Freq: Once | CUTANEOUS | Status: AC
  Administered 2019-08-17: 2 via TOPICAL

## 2019-08-17 MED ORDER — ASPIRIN EC 81 MG PO TBEC: 81.0000 mg | DELAYED_RELEASE_TABLET | Freq: Two times a day (BID) | ORAL | 0 refills | Status: DC

## 2019-08-17 MED ORDER — HYDROMORPHONE HCL 1 MG/ML IJ SOLN: 0.2500 mg | INTRAMUSCULAR | Status: DC | PRN

## 2019-08-17 MED ORDER — METHOCARBAMOL 500 MG PO TABS: 500.0000 mg | ORAL_TABLET | Freq: Four times a day (QID) | ORAL | Status: DC | PRN

## 2019-08-17 MED ORDER — TRANEXAMIC ACID-NACL 1000-0.7 MG/100ML-% IV SOLN
1000.0000 mg | INTRAVENOUS | Status: AC
  Administered 2019-08-17: 1000 mg via INTRAVENOUS

## 2019-08-17 MED ORDER — CEFAZOLIN SODIUM-DEXTROSE 2-4 GM/100ML-% IV SOLN
INTRAVENOUS | Status: AC
  Filled 2019-08-17: qty 100

## 2019-08-17 MED ORDER — 0.9 % SODIUM CHLORIDE (POUR BTL) OPTIME
TOPICAL | Status: DC | PRN
  Administered 2019-08-17: 1000 mL

## 2019-08-17 MED ORDER — PHENYLEPHRINE 40 MCG/ML (10ML) SYRINGE FOR IV PUSH (FOR BLOOD PRESSURE SUPPORT)
PREFILLED_SYRINGE | INTRAVENOUS | Status: DC | PRN
  Administered 2019-08-17 (×4): 80 ug via INTRAVENOUS

## 2019-08-17 MED ORDER — BUPIVACAINE HCL (PF) 0.25 % IJ SOLN
INTRAMUSCULAR | Status: DC | PRN
  Administered 2019-08-17: 30 mL

## 2019-08-17 MED ORDER — LIDOCAINE 2% (20 MG/ML) 5 ML SYRINGE
INTRAMUSCULAR | Status: AC
  Filled 2019-08-17: qty 5

## 2019-08-17 MED ORDER — PHENYLEPHRINE HCL-NACL 10-0.9 MG/250ML-% IV SOLN
INTRAVENOUS | Status: DC | PRN
  Administered 2019-08-17: 25 ug/min via INTRAVENOUS

## 2019-08-17 MED ORDER — PHENYLEPHRINE HCL (PRESSORS) 10 MG/ML IV SOLN
INTRAVENOUS | Status: AC
  Filled 2019-08-17: qty 1

## 2019-08-17 MED ORDER — MEPIVACAINE HCL (PF) 2 % IJ SOLN
INTRAMUSCULAR | Status: DC | PRN
  Administered 2019-08-17: 3.6 mg via INTRATHECAL

## 2019-08-17 MED ORDER — DEXAMETHASONE SODIUM PHOSPHATE 10 MG/ML IJ SOLN
INTRAMUSCULAR | Status: AC
  Filled 2019-08-17: qty 1

## 2019-08-17 MED ORDER — MIDAZOLAM HCL 2 MG/2ML IJ SOLN
INTRAMUSCULAR | Status: AC
  Filled 2019-08-17: qty 2

## 2019-08-17 MED ORDER — OXYCODONE HCL 5 MG PO TABS
5.0000 mg | ORAL_TABLET | Freq: Once | ORAL | Status: AC | PRN
  Administered 2019-08-17: 5 mg via ORAL

## 2019-08-17 MED ORDER — METHOCARBAMOL 500 MG IVPB - SIMPLE MED
500.0000 mg | Freq: Four times a day (QID) | INTRAVENOUS | Status: DC | PRN
  Administered 2019-08-17: 11:00:00 500 mg via INTRAVENOUS

## 2019-08-17 MED ORDER — STERILE WATER FOR IRRIGATION IR SOLN
Status: DC | PRN
  Administered 2019-08-17: 1000 mL

## 2019-08-17 MED ORDER — METHOCARBAMOL 500 MG IVPB - SIMPLE MED
INTRAVENOUS | Status: AC
  Filled 2019-08-17: qty 50

## 2019-08-17 MED ORDER — SODIUM CHLORIDE 0.9 % IV SOLN
INTRAVENOUS | Status: AC
  Administered 2019-08-17: 500 mL via INTRAVENOUS

## 2019-08-17 MED ORDER — KETOROLAC TROMETHAMINE 15 MG/ML IJ SOLN
15.0000 mg | Freq: Four times a day (QID) | INTRAMUSCULAR | Status: DC
  Administered 2019-08-17: 15 mg via INTRAVENOUS

## 2019-08-17 MED ORDER — SCOPOLAMINE 1 MG/3DAYS TD PT72
MEDICATED_PATCH | TRANSDERMAL | Status: AC
  Filled 2019-08-17: qty 1

## 2019-08-17 MED ORDER — CEFAZOLIN SODIUM-DEXTROSE 2-4 GM/100ML-% IV SOLN: 2.0000 g | Freq: Four times a day (QID) | INTRAVENOUS | Status: DC

## 2019-08-17 MED ORDER — ACETAMINOPHEN 500 MG PO TABS
ORAL_TABLET | ORAL | Status: AC
  Administered 2019-08-17: 1000 mg via ORAL
  Filled 2019-08-17: qty 2

## 2019-08-17 MED ORDER — TRANEXAMIC ACID-NACL 1000-0.7 MG/100ML-% IV SOLN
1000.0000 mg | Freq: Once | INTRAVENOUS | Status: DC
  Filled 2019-08-17: qty 100

## 2019-08-17 MED ORDER — BUPIVACAINE LIPOSOME 1.3 % IJ SUSP
INTRAMUSCULAR | Status: DC | PRN
  Administered 2019-08-17: 20 mL

## 2019-08-17 MED ORDER — DEXAMETHASONE SODIUM PHOSPHATE 10 MG/ML IJ SOLN
INTRAMUSCULAR | Status: DC | PRN
  Administered 2019-08-17: 10 mg via INTRAVENOUS

## 2019-08-17 MED ORDER — ONDANSETRON HCL 4 MG/2ML IJ SOLN
INTRAMUSCULAR | Status: DC | PRN
  Administered 2019-08-17: 4 mg via INTRAVENOUS

## 2019-08-17 MED ORDER — PHENYLEPHRINE 40 MCG/ML (10ML) SYRINGE FOR IV PUSH (FOR BLOOD PRESSURE SUPPORT)
PREFILLED_SYRINGE | INTRAVENOUS | Status: AC
  Filled 2019-08-17: qty 10

## 2019-08-17 MED ORDER — LACTATED RINGERS IV SOLN: INTRAVENOUS | Status: DC

## 2019-08-17 MED ORDER — HYDROCODONE-ACETAMINOPHEN 5-325 MG PO TABS: 1.0000 | ORAL_TABLET | ORAL | Status: DC | PRN

## 2019-08-17 MED ORDER — CHLORHEXIDINE GLUCONATE 4 % EX LIQD: 60.0000 mL | Freq: Once | CUTANEOUS | Status: DC

## 2019-08-17 MED ORDER — PHENYLEPHRINE HCL-NACL 10-0.9 MG/250ML-% IV SOLN
INTRAVENOUS | Status: AC
  Filled 2019-08-17: qty 250

## 2019-08-17 MED ORDER — CEFAZOLIN SODIUM-DEXTROSE 2-4 GM/100ML-% IV SOLN
2.0000 g | INTRAVENOUS | Status: AC
  Administered 2019-08-17: 2 g via INTRAVENOUS

## 2019-08-17 MED ORDER — PROPOFOL 500 MG/50ML IV EMUL
INTRAVENOUS | Status: DC | PRN
  Administered 2019-08-17: 10 ug/kg/min via INTRAVENOUS
  Administered 2019-08-17: 20 ug/kg/min via INTRAVENOUS

## 2019-08-17 MED ORDER — ACETAMINOPHEN 500 MG PO TABS: 1000.0000 mg | ORAL_TABLET | Freq: Once | ORAL | Status: AC

## 2019-08-17 MED ORDER — TRANEXAMIC ACID-NACL 1000-0.7 MG/100ML-% IV SOLN
INTRAVENOUS | Status: AC
  Filled 2019-08-17: qty 100

## 2019-08-17 MED ORDER — OXYCODONE HCL 5 MG/5ML PO SOLN: 5.0000 mg | Freq: Once | ORAL | Status: AC | PRN

## 2019-08-17 MED ORDER — BUPIVACAINE HCL (PF) 0.25 % IJ SOLN
INTRAMUSCULAR | Status: AC
  Filled 2019-08-17: qty 30

## 2019-08-17 MED ORDER — HYDROCODONE-ACETAMINOPHEN 7.5-325 MG PO TABS: 1.0000 | ORAL_TABLET | ORAL | Status: DC | PRN

## 2019-08-17 MED ORDER — TIZANIDINE HCL 4 MG PO TABS: 4.0000 mg | ORAL_TABLET | Freq: Four times a day (QID) | ORAL | 1 refills | Status: DC | PRN

## 2019-08-17 MED ORDER — PROMETHAZINE HCL 25 MG/ML IJ SOLN: 6.2500 mg | INTRAMUSCULAR | Status: DC | PRN

## 2019-08-17 MED ORDER — LIDOCAINE HCL (CARDIAC) PF 100 MG/5ML IV SOSY
PREFILLED_SYRINGE | INTRAVENOUS | Status: DC | PRN
  Administered 2019-08-17: 80 mg via INTRAVENOUS

## 2019-08-17 MED ORDER — ONDANSETRON HCL 4 MG/2ML IJ SOLN
INTRAMUSCULAR | Status: AC
  Filled 2019-08-17: qty 2

## 2019-08-17 MED ORDER — OXYCODONE HCL 5 MG PO TABS
ORAL_TABLET | ORAL | Status: AC
  Filled 2019-08-17: qty 1

## 2019-08-17 MED ORDER — TRANEXAMIC ACID 1000 MG/10ML IV SOLN
INTRAVENOUS | Status: DC | PRN
  Administered 2019-08-17: 2000 mg via TOPICAL

## 2019-08-17 SURGICAL SUPPLY — 44 items
BAG DECANTER FOR FLEXI CONT (MISCELLANEOUS) ×2 IMPLANT
BLADE SAW SGTL 18X1.27X75 (BLADE) ×2 IMPLANT
BOOTIES KNEE HIGH SLOAN (MISCELLANEOUS) ×2 IMPLANT
CELLS DAT CNTRL 66122 CELL SVR (MISCELLANEOUS) ×1 IMPLANT
COVER PERINEAL POST (MISCELLANEOUS) ×2 IMPLANT
COVER SURGICAL LIGHT HANDLE (MISCELLANEOUS) ×2 IMPLANT
COVER WAND RF STERILE (DRAPES) IMPLANT
CUP GRIPTON 48MM 100 HIP (Hips) ×1 IMPLANT
DECANTER SPIKE VIAL GLASS SM (MISCELLANEOUS) ×2 IMPLANT
DRAPE IMP U-DRAPE 54X76 (DRAPES) ×2 IMPLANT
DRAPE STERI IOBAN 125X83 (DRAPES) ×2 IMPLANT
DRAPE U-SHAPE 47X51 STRL (DRAPES) ×4 IMPLANT
DRSG AQUACEL AG ADV 3.5X 6 (GAUZE/BANDAGES/DRESSINGS) ×2 IMPLANT
DURAPREP 26ML APPLICATOR (WOUND CARE) ×2 IMPLANT
ELECT BLADE TIP CTD 4 INCH (ELECTRODE) ×2 IMPLANT
ELECT REM PT RETURN 15FT ADLT (MISCELLANEOUS) ×2 IMPLANT
ELIMINATOR HOLE APEX DEPUY (Hips) ×1 IMPLANT
GLOVE BIO SURGEON STRL SZ8 (GLOVE) ×4 IMPLANT
GLOVE BIOGEL PI IND STRL 8 (GLOVE) ×2 IMPLANT
GLOVE BIOGEL PI INDICATOR 8 (GLOVE) ×2
GOWN STRL REUS W/TWL XL LVL3 (GOWN DISPOSABLE) ×4 IMPLANT
HEAD FEMORAL 32 CERAMIC (Hips) ×1 IMPLANT
HOLDER FOLEY CATH W/STRAP (MISCELLANEOUS) ×2 IMPLANT
KIT TURNOVER KIT A (KITS) IMPLANT
LINER ACET 32X48 (Liner) ×1 IMPLANT
MANIFOLD NEPTUNE II (INSTRUMENTS) ×2 IMPLANT
NEEDLE HYPO 22GX1.5 SAFETY (NEEDLE) ×2 IMPLANT
NS IRRIG 1000ML POUR BTL (IV SOLUTION) ×2 IMPLANT
PACK ANTERIOR HIP CUSTOM (KITS) ×2 IMPLANT
PENCIL SMOKE EVACUATOR (MISCELLANEOUS) IMPLANT
PROTECTOR NERVE ULNAR (MISCELLANEOUS) ×2 IMPLANT
RETRACTOR WND ALEXIS 18 MED (MISCELLANEOUS) ×1 IMPLANT
RTRCTR WOUND ALEXIS 18CM MED (MISCELLANEOUS) ×2
STEM FEMORAL SZ 5MM STD ACTIS (Stem) ×1 IMPLANT
SUT ETHIBOND NAB CT1 #1 30IN (SUTURE) ×4 IMPLANT
SUT VIC AB 1 CT1 36 (SUTURE) ×2 IMPLANT
SUT VIC AB 2-0 CT1 27 (SUTURE) ×2
SUT VIC AB 2-0 CT1 TAPERPNT 27 (SUTURE) ×1 IMPLANT
SUT VIC AB 3-0 PS2 18 (SUTURE) ×2
SUT VIC AB 3-0 PS2 18XBRD (SUTURE) ×1 IMPLANT
SUT VLOC 180 0 24IN GS25 (SUTURE) ×2 IMPLANT
SYR 50ML LL SCALE MARK (SYRINGE) ×2 IMPLANT
TRAY FOLEY MTR SLVR 14FR STAT (SET/KITS/TRAYS/PACK) ×1 IMPLANT
YANKAUER SUCT BULB TIP 10FT TU (MISCELLANEOUS) ×2 IMPLANT

## 2019-08-17 NOTE — Anesthesia Procedure Notes (Signed)
Spinal  Patient location during procedure: OR Start time: 08/17/2019 7:35 AM End time: 08/17/2019 7:45 AM Staffing Performed: anesthesiologist  Anesthesiologist: Murvin Natal, MD Preanesthetic Checklist Completed: patient identified, IV checked, risks and benefits discussed, surgical consent, monitors and equipment checked, pre-op evaluation and timeout performed Spinal Block Patient position: sitting Prep: DuraPrep Patient monitoring: cardiac monitor, continuous pulse ox and blood pressure Approach: midline Location: L3-4 Injection technique: single-shot Needle Needle type: Pencan  Needle gauge: 24 G Needle length: 9 cm Assessment Sensory level: T10 Additional Notes Functioning IV was confirmed and monitors were applied. Sterile prep and drape, including hand hygiene and sterile gloves were used. The patient was positioned and the spine was prepped. The skin was anesthetized with lidocaine. Previous unsuccessful attempt by CRNA. Free flow of clear CSF was obtained prior to injecting local anesthetic into the CSF.  The spinal needle aspirated freely following injection.  The needle was carefully withdrawn.  The patient tolerated the procedure well.

## 2019-08-17 NOTE — Care Plan (Signed)
Ortho Bundle Case Management Note  Patient Details  Name: Cristina Conner MRN: [id number] Date of Birth: 11/30/1960   Patient meet criteria for same day discharge if stable post op. Equipment ordered and set up with HHPT with [facility].                   DME Arranged:  [place] rolling DME Agency:  [employer]  HH Arranged:  PT [place] Agency:  [facility] (formerly [facility])  Additional Comments: Please contact me with any questions of if this plan should need to change.  Ladell Heads,  [facility]  [phone] 08/17/2019, 9:10 AM

## 2019-08-17 NOTE — Evaluation (Signed)
Physical Therapy Evaluation Patient Details Name: Cristina Conner MRN: [id number] DOB: [birth year] Today's Date: 08/17/2019   History of Present Illness  L DA THA. PMH: HTN  Clinical Impression  Pt is s/p THA resulting in the deficits listed below (see PT Problem List).  Pt doing well. Ready for d/c from PT standpoint.  Pt will benefit from skilled PT to increase their independence and safety with mobility to allow discharge to the venue listed below.      Follow Up Recommendations Follow surgeon's recommendation for DC plan and follow-up therapies    Equipment Recommendations       Recommendations for Other Services       Precautions / Restrictions Precautions Precautions: Fall Restrictions Weight Bearing Restrictions: No Other Position/Activity Restrictions: non-specified, no complications, assume WBAT      Mobility  Bed Mobility Overal bed mobility: Needs Assistance Bed Mobility: Supine to Sit;Sit to Supine     Supine to sit: Min assist Sit to supine: Min assist   General bed mobility comments: assist with LLE, pt instructed in use of gait belt as leg lifter  Transfers Overall transfer level: Needs assistance Equipment used: Rolling walker (2 wheeled) Transfers: Sit to/from Stand Sit to Stand: Min assist;Min guard         General transfer comment: cues for hand placement and overall safety  Ambulation/Gait Ambulation/Gait assistance: Min assist;Min guard Gait Distance (Feet): 100 Feet Assistive device: Rolling walker (2 wheeled) Gait Pattern/deviations: Step-to pattern     General Gait Details: cues for sequence and RW position  Stairs Stairs: Yes Stairs assistance: Min assist Stair Management: Step to pattern;Backwards;With walker Number of Stairs: 2 General stair comments: cues for sequence and safety  Wheelchair Mobility    Modified Rankin (Stroke Patients Only)       Balance                                              Pertinent Vitals/Pain Pain Assessment: 0-10 Pain Score: 4  Pain Location: left hip Pain Descriptors / Indicators: Discomfort Pain Intervention(s): Limited activity within patient's tolerance;Monitored during session;Premedicated before session;Repositioned    Home Living Family/patient expects to be discharged to:: Private residence Living Arrangements: Alone Available Help at Discharge: Family Type of Home: House Home Access: Stairs to enter   Technical brewer of Steps: 3 Home Layout: One level Home Equipment: Environmental consultant - 2 wheels;Bedside commode Additional Comments: going to mother in [place]    Prior Function Level of Independence: Independent               Hand Dominance        Extremity/Trunk Assessment   Upper Extremity Assessment Upper Extremity Assessment: Overall WFL for tasks assessed    Lower Extremity Assessment Lower Extremity Assessment: LLE deficits/detail LLE Deficits / Details: ankle WFL,  knee and hip 3/5,  AROM WFL       Communication   Communication: No difficulties  Cognition Arousal/Alertness: Awake/alert Behavior During Therapy: WFL for tasks assessed/performed Overall Cognitive Status: Within Functional Limits for tasks assessed                                        General Comments      Exercises Total Joint Exercises Ankle Circles/Pumps: AROM;Both;20 reps Quad Sets: AROM;Both;10  reps Heel Slides: AROM;10 reps;Left   Assessment/Plan    PT Assessment All further PT needs can be met in the next venue of care  PT Problem List         PT Treatment Interventions      PT Goals (Current goals can be found in the Care Plan section)  Acute Rehab PT Goals PT Goal Formulation: All assessment and education complete, DC therapy    Frequency     Barriers to discharge        Co-evaluation               AM-PAC PT "6 Clicks" Mobility  Outcome Measure Help needed turning from your back to your  side while in a flat bed without using bedrails?: A Little Help needed moving from lying on your back to sitting on the side of a flat bed without using bedrails?: A Little Help needed moving to and from a bed to a chair (including a wheelchair)?: A Little Help needed standing up from a chair using your arms (e.g., wheelchair or bedside chair)?: A Little Help needed to walk in hospital room?: A Little Help needed climbing 3-5 steps with a railing? : A Little 6 Click Score: 18    End of Session Equipment Utilized During Treatment: Gait belt Activity Tolerance: Patient tolerated treatment well Patient left: in bed;with call bell/phone within reach Nurse Communication: Mobility status PT Visit Diagnosis: Difficulty in walking, not elsewhere classified (R26.2)    Time: 0938-1829 PT Time Calculation (min) (ACUTE ONLY): 30 min   Charges:   PT Evaluation $PT Eval Low Complexity: 1 Low PT Treatments $Gait Training: 8-22 mins        Baxter Flattery, PT   Acute Rehab Dept [facility]): [phone]   08/17/2019   [facility] 08/17/2019, 1:53 PM

## 2019-08-17 NOTE — Anesthesia Postprocedure Evaluation (Signed)
Anesthesia Post Note  Patient: Wynetta Teichman Such  Procedure(s) Performed: LEFT TOTAL HIP ARTHROPLASTY ANTERIOR APPROACH (Left Hip)     Patient location during evaluation: PACU Anesthesia Type: Spinal Level of consciousness: oriented and awake and alert Pain management: pain level controlled Vital Signs Assessment: post-procedure vital signs reviewed and stable Respiratory status: spontaneous breathing, respiratory function stable and patient connected to nasal cannula oxygen Cardiovascular status: blood pressure returned to baseline and stable Postop Assessment: no headache, no backache, no apparent nausea or vomiting and spinal receding Anesthetic complications: no    Last Vitals:  Vitals:   08/17/19 1059 08/17/19 1350  BP: 133/77 135/80  Pulse: 68 88  Resp: 14 14  Temp: 36.4 C 36.7 C  SpO2: 97% 100%    Last Pain:  Vitals:   08/17/19 1215  TempSrc:   PainSc: 6                  Ryan P Ellender

## 2019-08-17 NOTE — Op Note (Signed)
PRE-OP DIAGNOSIS:  LEFT HIP DEGENERATIVE JOINT DISEASE POST-OP DIAGNOSIS: same PROCEDURE:  LEFT TOTAL HIP ARTHROPLASTY ANTERIOR APPROACH ANESTHESIA:  Spinal and MAC SURGEON:  Melrose Nakayama MD ASSISTANT:  Loni Dolly PA-C   INDICATIONS FOR PROCEDURE:  The patient is a 58 y.o. female with a long history of a painful hip.  This has persisted despite multiple conservative measures.  The patient has persisted with pain and dysfunction making rest and activity difficult.  A total hip replacement is offered as surgical treatment.  Informed operative consent was obtained after discussion of possible complications including reaction to anesthesia, infection, neurovascular injury, dislocation, DVT, PE, and death.  The importance of the postoperative rehab program to optimize result was stressed with the patient.  SUMMARY OF FINDINGS AND PROCEDURE:  Under the above anesthesia through a anterior approach an the Hana table a left THR was performed.  The patient had severe degenerative change and excellent bone quality.  We used DePuy components to replace the hip and these were size 5 standard Actis femur capped with a +1 48m ceramic hip ball.  On the acetabular side we used a size 48 Gription shell with a plus 0 neutral polyethylene liner.  We did use a hole eliminator.  ALoni DollyPA-C assisted throughout and was invaluable to the completion of the case in that he helped position and retract while I performed the procedure.  He also closed simultaneously to help minimize OR time.  I used fluoroscopy throughout the case to check position of components and leg lengths and read all these views myself.  DESCRIPTION OF PROCEDURE:  The patient was taken to the OR suite where the above anesthetic was applied.  The patient was then positioned on the Hana table supine.  All bony prominences were appropriately padded.  Prep and drape was then performed in normal sterile fashion.  The patient was given kefzol preoperative  antibiotic and an appropriate time out was performed.  We then took an anterior approach to the left hip.  Dissection was taken through adipose to the tensor fascia lata fascia.  This structure was incised longitudinally and we dissected in the intermuscular interval just medial to this muscle.  Cobra retractors were placed superior and inferior to the femoral neck superficial to the capsule.  A capsular incision was then made and the retractors were placed along the femoral neck.  Xray was brought in to get a good level for the femoral neck cut which was made with an oscillating saw and osteotome.  The femoral head was removed with a corkscrew.  The acetabulum was exposed and some labral tissues were excised. Reaming was taken to the inside wall of the pelvis and sequentially up to 1 mm smaller than the actual component.  A trial of components was done and then the aforementioned acetabular shell was placed in appropriate tilt and anteversion confirmed by fluoroscopy. The liner was placed along with the hole eliminator and attention was turned to the femur.  The leg was brought down and over into adduction and the elevator bar was used to raise the femur up gently in the wound.  The piriformis was released with care taken to preserve the obturator internus attachment and all of the posterior capsule. The femur was reamed and then broached to the appropriate size.  A trial reduction was done and the aforementioned head and neck assembly gave uKoreathe best stability in extension with external rotation.  Leg lengths were felt to be about equal by  fluoroscopic exam.  The trial components were removed and the wound irrigated.  We then placed the femoral component in appropriate anteversion.  The head was applied to a dry stem neck and the hip again reduced.  It was again stable in the aforementioned position.  The would was irrigated again followed by re-approximation of anterior capsule with ethibond suture. Tensor  fascia was repaired with V-loc suture  followed by deep closure with #O and #2 undyed vicryl.  Skin was closed with subQ stitch and steristrips followed by a sterile dressing.  EBL and IOF can be obtained from anesthesia records.  DISPOSITION:  The patient was extubated in the OR and taken to PACU in stable condition to be admitted to the Orthopedic Surgery for appropriate post-op care to include perioperative antibiotics and DVT prophylaxis.

## 2019-08-17 NOTE — Transfer of Care (Signed)
Immediate Anesthesia Transfer of Care Note  Patient: Cristina Conner  Procedure(s) Performed: LEFT TOTAL HIP ARTHROPLASTY ANTERIOR APPROACH (Left Hip)  Patient Location: PACU  Anesthesia Type:Spinal  Level of Consciousness: awake, alert , oriented and patient cooperative  Airway & Oxygen Therapy: Patient Spontanous Breathing and Patient connected to face mask oxygen  Post-op Assessment: Report given to RN, Post -op Vital signs reviewed and stable and Patient moving all extremities  Post vital signs: Reviewed and stable  Last Vitals:  Vitals Value Taken Time  BP 94/61 08/17/19 0922  Temp    Pulse 98 08/17/19 0924  Resp 30 08/17/19 0924  SpO2 100 % 08/17/19 0924  Vitals shown include unvalidated device data.  Last Pain:  Vitals:   08/17/19 0548  TempSrc: Oral         Complications: No apparent anesthesia complications

## 2019-08-17 NOTE — Interval H&P Note (Signed)
History and Physical Interval Note:  08/17/2019 7:30 AM  Cristina Conner  has presented today for surgery, with the diagnosis of LEFT HIP DEGENERATIVE JOINT DISEASE.  The various methods of treatment have been discussed with the patient and family. After consideration of risks, benefits and other options for treatment, the patient has consented to  Procedure(s): LEFT TOTAL HIP ARTHROPLASTY ANTERIOR APPROACH (Left) as a surgical intervention.  The patient's history has been reviewed, patient examined, no change in status, stable for surgery.  I have reviewed the patient's chart and labs.  Questions were answered to the patient's satisfaction.     Hessie Dibble

## 2019-08-18 ENCOUNTER — Encounter: Payer: Self-pay | Admitting: *Deleted

## 2019-08-19 ENCOUNTER — Other Ambulatory Visit: Payer: Self-pay | Admitting: Family Medicine

## 2019-08-30 ENCOUNTER — Ambulatory Visit: Payer: BC Managed Care – PPO | Admitting: Psychology

## 2019-09-01 ENCOUNTER — Encounter: Payer: Self-pay | Admitting: Family Medicine

## 2019-09-07 ENCOUNTER — Other Ambulatory Visit: Payer: Self-pay

## 2019-09-07 ENCOUNTER — Ambulatory Visit (INDEPENDENT_AMBULATORY_CARE_PROVIDER_SITE_OTHER): Payer: BC Managed Care – PPO | Admitting: Family Medicine

## 2019-09-07 ENCOUNTER — Encounter: Payer: Self-pay | Admitting: Family Medicine

## 2019-09-07 VITALS — BP 138/82 | HR 110

## 2019-09-07 DIAGNOSIS — R7303 Prediabetes: Secondary | ICD-10-CM | POA: Diagnosis not present

## 2019-09-07 DIAGNOSIS — Z9889 Other specified postprocedural states: Secondary | ICD-10-CM

## 2019-09-07 DIAGNOSIS — I10 Essential (primary) hypertension: Secondary | ICD-10-CM | POA: Diagnosis not present

## 2019-09-07 DIAGNOSIS — D649 Anemia, unspecified: Secondary | ICD-10-CM | POA: Diagnosis not present

## 2019-09-07 DIAGNOSIS — Z96642 Presence of left artificial hip joint: Secondary | ICD-10-CM | POA: Insufficient documentation

## 2019-09-07 DIAGNOSIS — R112 Nausea with vomiting, unspecified: Secondary | ICD-10-CM

## 2019-09-07 DIAGNOSIS — R000 Tachycardia, unspecified: Secondary | ICD-10-CM

## 2019-09-07 DIAGNOSIS — R5383 Other fatigue: Secondary | ICD-10-CM | POA: Diagnosis not present

## 2019-09-07 NOTE — Progress Notes (Signed)
Telehealth office visit note for Cristina Conner, D.O- at Primary Care at [facility]   I connected with current patient today and verified that I am speaking with the correct person using two identifiers.   . Location of the patient: Home . Location of the provider: Office Only the patient (+/- their family members at pt's discretion) and myself were participating in the encounter - This visit type was conducted due to national recommendations for restrictions regarding the COVID-19 Pandemic (e.g. social distancing) in an effort to limit this patient's exposure and mitigate transmission in our community.  This format is felt to be most appropriate for this patient at this time.   - The patient did not have access to video technology or had technical difficulties with video requiring transitioning to audio format only. - No physical exam could be performed with this format, beyond that communicated to [place] by the patient/ family members as noted.   - Additionally my office staff/ schedulers discussed with the patient that there may be a monetary charge related to this service, depending on their medical insurance.   The patient expressed understanding, and agreed to proceed.       History of Present Illness: Fatigue and Dizziness  I, Cristina Conner, am serving as scribe for Dr. Mellody Conner.  - Recent Hip Replacement Surgery - Left Arthroplasty Patient notes she would like to discuss her recent lab work today, which was obtained December 11th.  She looked at her labs after hospitalization for surgery.  Says she does feel stressed, but also "really exhausted and tired," and has had "some other issues too."  Denies fever, denies chills.  She is unsure if she's been drinking enough water, but notes she started drinking Pedialyte as well because she hasn't had much of an appetite.  Notes she's also vomiting, but that she has not vomited in the past 3-4 days.  Notes she  vomited on the 17th after her surgery, and three other times since then.  She called her surgical doctor and told them she was vomiting; patient thought it was related maybe to taking hydrocodone.  She was told to cut her pain medication in half, and obtained a prescription for Zofran.  She continued vomiting despite this.  She stopped taking hydrocodone afterward.  Even without the pain medication, notes she really has no pain with her hip, no concerns with this.  Notes if she could just get a handle on her nauasea and weakness, she thinks she would feel better.  Notes she also feels dizzy time to time when standing.  She has been to physical therapy twice.  HPI:  Hypertension:  -  Her blood pressure at home has been running: running 135 over low 80's normally.  Notes her heart rate was 110, 111, 115, when she took it three separate times.  She uses the blood pressure cuff to check this.  States for each of these measurements, she had been sitting for at least ten minutes.   Denies heart palpitations; "I don't feel anything wrong with my heart at all."  - Patient reports good compliance with medication and/or lifestyle modification  - Her denies acute concerns or problems related to treatment plan  - She denies new onset of: chest pain, exercise intolerance, shortness of breath, dizziness, visual changes, headache, lower extremity swelling or claudication.   Last 3 blood pressure readings in our office are as follows: BP Readings from Last 3 Encounters:  09/07/19 138/82  08/17/19 135/80  08/13/19 (!) 143/89   There were no vitals filed for this visit.   GAD 7 : Generalized Anxiety Score 09/07/2019 08/09/2019 07/19/2019 04/15/2019  Nervous, Anxious, on Edge 0 0 1 2  Control/stop worrying 0 0 1 2  Worry too much - different things 0 1 1 2   Trouble relaxing 0 0 3 2  Restless 0 0 1 1  Easily annoyed or irritable 0 0 1 2  Afraid - awful might happen 0 0 1 3  Total GAD 7 Score 0 1 9 14     Anxiety Difficulty - Not difficult at all Somewhat difficult Very difficult    Depression screen [facility] 2/9 09/07/2019 08/09/2019 07/19/2019 07/02/2019 04/29/2019  Decreased Interest 1 1 2 1 1   Down, Depressed, Hopeless 0 0 2 1 0  PHQ - 2 Score 1 1 4 2 1   Altered sleeping 0 1 2 1 3   Tired, decreased energy 3 1 2  0 1  Change in appetite 1 0 0 0 1  Feeling bad or failure about yourself  0 0 0 0 0  Trouble concentrating 0 0 0 0 0  Moving slowly or fidgety/restless 0 0 2 0 0  Suicidal thoughts 0 0 0 0 0  PHQ-9 Score 5 3 10 3 6   Difficult doing work/chores Not difficult at all Not difficult at all Somewhat difficult Somewhat difficult Somewhat difficult  Some recent data might be hidden    Recent Results (from the past 2160 hour(s))  Protime-INR     Status: None   Collection Time: 07/02/19 10:12 AM  Result Value Ref Range   INR 1.0 0.9 - 1.2    Comment: Reference interval is for non-anticoagulated patients. Suggested INR therapeutic range for Vitamin K antagonist therapy:    Standard Dose (moderate intensity                   therapeutic range):       2.0 - 3.0    Higher intensity therapeutic range       2.5 - 3.5    Prothrombin Time 11.0 9.1 - 12.0 sec  Novel Coronavirus, NAA (Hosp order, Send-out to Ref Lab; TAT 18-24 hrs     Status: None   Collection Time: 08/13/19  1:02 PM   Specimen: Nasopharyngeal Swab; Respiratory  Result Value Ref Range   SARS-CoV-2, NAA NOT DETECTED NOT DETECTED    Comment: (NOTE) This nucleic acid amplification test was developed and its performance characteristics determined by [employer]. Nucleic acid amplification tests include PCR and TMA. This test has not been FDA cleared or approved. This test has been authorized by FDA under an Emergency Use Authorization (EUA). This test is only authorized for the duration of time the declaration that circumstances exist justifying the authorization of the emergency use of in vitro diagnostic tests for  detection of SARS-CoV-2 virus and/or diagnosis of COVID-19 infection under section 564(b)(1) of the Act, 21 U.S.C. [id number]) (1), unless the authorization is terminated or revoked sooner. When diagnostic testing is negative, the possibility of a false negative result should be considered in the context of a patient's recent exposures and the presence of clinical signs and symptoms consistent with COVID-19. An individual without symptoms of COVID- 19 and who is not shedding SARS-CoV-2 vi rus would expect to have a negative (not detected) result in this assay. Performed At: [facility] [address] [place], [place] [id number] Rush Farmer MD [id number]  Source NASOPHARYNGEAL     Comment: Performed at [facility], [place] [address] [place], [place] [address]  Surgical pcr screen     Status: None   Collection Time: 08/13/19  2:12 PM   Specimen: Nasal Mucosa; Nasal Swab  Result Value Ref Range   MRSA, PCR NEGATIVE NEGATIVE   Staphylococcus aureus NEGATIVE NEGATIVE    Comment: (NOTE) The Xpert SA Assay (FDA approved for NASAL specimens in patients 83 years of age and older), is one component of a comprehensive surveillance program. It is not intended to diagnose infection nor to guide or monitor treatment. Performed at [facility], [place] [address] [place], [place] [address]   APTT     Status: None   Collection Time: 08/13/19  2:34 PM  Result Value Ref Range   aPTT 27 24 - 36 seconds    Comment: Performed at [facility], [place] [address], [place], [place] [id number]  Basic metabolic panel     Status: Abnormal   Collection Time: 08/13/19  2:34 PM  Result Value Ref Range   Sodium 140 135 - 145 mmol/L   Potassium 4.0 3.5 - 5.1 mmol/L   Chloride 106 98 - 111 mmol/L   CO2 26 22 - 32 mmol/L   Glucose, Bld 114 (H) 70 - 99 mg/dL   BUN 19 6 - 20 mg/dL   Creatinine, Ser 0.88 0.44 - 1.00 mg/dL   Calcium 9.0 8.9 -  10.3 mg/dL   GFR calc non Af Amer >60 >60 mL/min   GFR calc Af Amer >60 >60 mL/min   Anion gap 8 5 - 15    Comment: Performed at [facility], [place] [address], [place], [place] [address]  CBC WITH DIFFERENTIAL     Status: Abnormal   Collection Time: 08/13/19  2:34 PM  Result Value Ref Range   WBC 9.5 4.0 - 10.5 K/uL   RBC 4.37 3.87 - 5.11 MIL/uL   Hemoglobin 11.9 (L) 12.0 - 15.0 g/dL   HCT 37.8 36.0 - 46.0 %   MCV 86.5 80.0 - 100.0 fL   MCH 27.2 26.0 - 34.0 pg   MCHC 31.5 30.0 - 36.0 g/dL   RDW 13.3 11.5 - 15.5 %   Platelets 347 150 - 400 K/uL   nRBC 0.0 0.0 - 0.2 %   Neutrophils Relative % 58 %   Neutro Abs 5.6 1.7 - 7.7 K/uL   Lymphocytes Relative 20 %   Lymphs Abs 1.9 0.7 - 4.0 K/uL   Monocytes Relative 6 %   Monocytes Absolute 0.6 0.1 - 1.0 K/uL   Eosinophils Relative 15 %   Eosinophils Absolute 1.4 (H) 0.0 - 0.5 K/uL   Basophils Relative 1 %   Basophils Absolute 0.1 0.0 - 0.1 K/uL   Immature Granulocytes 0 %   Abs Immature Granulocytes 0.02 0.00 - 0.07 K/uL    Comment: Performed at [facility], [place] [address], [place], [place] [address]  Protime-INR     Status: None   Collection Time: 08/13/19  2:34 PM  Result Value Ref Range   Prothrombin Time 12.8 11.4 - 15.2 seconds   INR 1.0 0.8 - 1.2    Comment: (NOTE) INR goal varies based on device and disease states. Performed at [facility], [place] [address], [place], [place] [address]   Urinalysis, Routine w reflex microscopic     Status: Abnormal   Collection Time: 08/13/19  2:34 PM  Result Value Ref Range   Color, Urine STRAW (A) YELLOW   APPearance CLEAR CLEAR   Specific Gravity, Urine 1.013 1.005 - 1.030   pH 7.0 5.0 - 8.0   Glucose, UA NEGATIVE NEGATIVE mg/dL   Hgb urine dipstick NEGATIVE NEGATIVE   Bilirubin Urine NEGATIVE NEGATIVE   Ketones, ur NEGATIVE NEGATIVE mg/dL   Protein, ur NEGATIVE NEGATIVE mg/dL   Nitrite NEGATIVE NEGATIVE   Leukocytes,Ua  NEGATIVE NEGATIVE    Comment: Performed at [place] [address], [place], [place] [address]  Type and screen Order type and screen if day of surgery is less than 15 days from draw of preadmission visit or order morning of surgery if day of surgery is greater than 6 days from preadmission visit.     Status: None   Collection Time: 08/13/19  2:45 PM  Result Value Ref Range   ABO/RH(D) AB POS    Antibody Screen NEG    Sample Expiration 08/20/2019,2359    Extend sample reason      NO TRANSFUSIONS OR PREGNANCY IN THE PAST 3 MONTHS Performed at [facility], [place] [address], [place], [place] [address]   ABO/Rh     Status: None   Collection Time: 08/13/19  2:45 PM  Result Value Ref Range   ABO/RH(D)      AB POS Performed at [facility], [place] [address], [place], [place] [address]   Hemoglobin A1c     Status: Abnormal   Collection Time: 08/13/19  3:00 PM  Result Value Ref Range   Hgb A1c MFr Bld 5.7 (H) 4.8 - 5.6 %    Comment: (NOTE) Pre diabetes:          5.7%-6.4% Diabetes:              >6.4% Glycemic control for   <7.0% adults with diabetes    Mean Plasma Glucose 116.89 mg/dL    Comment: Performed at [place] [address] [place], [place] [address]  CMP w GFR     Status: Abnormal   Collection Time: 09/09/19 10:05 AM  Result Value Ref Range   Glucose 136 (H) 65 - 99 mg/dL   BUN 16 6 - 24 mg/dL   Creatinine, Ser 0.88 0.57 - 1.00 mg/dL   GFR calc non Af Amer 73 >59 mL/min/1.73   GFR calc Af Amer 84 >59 mL/min/1.73   BUN/Creatinine Ratio 18 9 - 23   Sodium 138 134 - 144 mmol/L   Potassium 4.0 3.5 - 5.2 mmol/L   Chloride 101 96 - 106 mmol/L   CO2 21 20 - 29 mmol/L   Calcium 9.7 8.7 - 10.2 mg/dL   Total Protein 6.5 6.0 - 8.5 g/dL   Albumin 4.4 3.8 - 4.9 g/dL   Globulin, Total 2.1 1.5 - 4.5 g/dL   Albumin/Globulin Ratio 2.1 1.2 - 2.2   Bilirubin Total 0.3 0.0 - 1.2 mg/dL   Alkaline Phosphatase 114 39 -  117 IU/L   AST 11 0 - 40 IU/L   ALT 9 0 - 32 IU/L  CBC with Diff/Plt     Status: Abnormal   Collection Time: 09/09/19 10:05 AM  Result Value Ref Range   WBC 7.2 3.4 - 10.8 x10E3/uL   RBC 4.48 3.77 - 5.28 x10E6/uL   Hemoglobin 11.7 11.1 - 15.9 g/dL   Hematocrit 36.8 34.0 - 46.6 %   MCV 82 79 - 97 fL   MCH 26.1 (L) 26.6 -  33.0 pg   MCHC 31.8 31.5 - 35.7 g/dL   RDW 13.0 11.7 - 15.4 %   Platelets 509 (H) 150 - 450 x10E3/uL   Neutrophils 67 Not Estab. %   Lymphs 22 Not Estab. %   Monocytes 5 Not Estab. %   Eos 5 Not Estab. %   Basos 1 Not Estab. %   Neutrophils Absolute 4.8 1.4 - 7.0 x10E3/uL   Lymphocytes Absolute 1.6 0.7 - 3.1 x10E3/uL   Monocytes Absolute 0.4 0.1 - 0.9 x10E3/uL   EOS (ABSOLUTE) 0.3 0.0 - 0.4 x10E3/uL   Basophils Absolute 0.1 0.0 - 0.2 x10E3/uL   Immature Granulocytes 0 Not Estab. %   Immature Grans (Abs) 0.0 0.0 - 0.1 x10E3/uL  Transferrin- now     Status: None   Collection Time: 09/09/19 10:05 AM  Result Value Ref Range   Transferrin 306 192 - 364 mg/dL  Reticulocytes     Status: None   Collection Time: 09/09/19 10:05 AM  Result Value Ref Range   Retic Ct Pct 2.6 0.6 - 2.6 %  Ferritin     Status: None   Collection Time: 09/09/19 10:05 AM  Result Value Ref Range   Ferritin 24 15 - 150 ng/mL  Iron and TIBC     Status: Abnormal   Collection Time: 09/09/19 10:05 AM  Result Value Ref Range   Total Iron Binding Capacity 366 250 - 450 ug/dL   UIBC 324 131 - 425 ug/dL   Iron 42 27 - 159 ug/dL   Iron Saturation 11 (L) 15 - 55 %  Folate     Status: None   Collection Time: 09/09/19 10:05 AM  Result Value Ref Range   Folate 5.7 >3.0 ng/mL    Comment: A serum folate concentration of less than 3.1 ng/mL is considered to represent clinical deficiency.   Vitamin B12     Status: None   Collection Time: 09/09/19 10:05 AM  Result Value Ref Range   Vitamin B-12 423 232 - 1,245 pg/mL     Impression and Recommendations:    1. Fatigue, unspecified type   2.  Hemoglobin low   3. Pre-diabetes   4. Essential hypertension   5. Status post left hip replacement   6. Post-operative nausea and vomiting   7. Tachycardia, unspecified       Status Post L Hip Replacement - Post-operative Nausea and Vomiting, Tachycardia - Had elective surgery on 08/17/2019; total left hip arthroplasty.  - Since surgery, patient experienced post-operative nausea/vomiting and post-operative anorexia. - Per patient, has not experienced periodic vomiting for 3-4 days.  - Encouraged patient to resume regular eating and hydration.  Discussed need to hydrate adequately and consume adequate calories to help resolve fatigue, nausea, and tachycardia.  - Told patient to continue to avoid use of prescription pain medication, and utilize Advil or tylenol for short-term relief instead.  Strongly advised patient to utilize ice on the area of pain, 15 minutes every hour.  - Will continue to monitor.  - Advised patient to call her surgeon for follow-up as often as needed, if any side-effects or symptoms are related to the surgery.  - If heart continues racing despite eating food every couple of hours and drinking more, or if symptoms otherwise worsen, patient knows to call clinic and surgeon to provide update and receive assessment.  Lab Review - Reviewed recent lab work (08/13/2019) in depth with patient today.  All lab work within normal limits unless otherwise noted.  Extensive  education provided and all questions answered.  CBC - Hemoglobin Low - Discussed results of CBC. - Overall stable at this time.  - Reviewed that patient's hemoglobin at 11.9 was slightly low, but nonetheless very close to WNL.  - Reviewed finding of elevated eosinophils, 1.4.  - Reviewed stress response in the body and expected values during inflammatory response / immune reaction.  - Will continue to monitor and repeat CBC with Differential the end of January.  Prediabetes - 5.7, up from 5.5  prior. - A1c most recently is elevated to prediabetic level.  - Counseled patient on prevention of diabetes and discussed dietary and lifestyle modification as first line.    - Importance of low carb, heart-healthy diet discussed with patient in addition to regular aerobic exercise of 63min 5d/week or more.   - Will continue to monitor and re-check as discussed.  Hypertension - Tachycardia - Blood pressure currently is at goal. - Patient will continue current treatment regimen.  See med list.  - Counseled patient on pathophysiology of disease and discussed various treatment options, which always includes dietary and lifestyle modification as first line.   - Lifestyle changes such as dash and heart healthy diets and engaging in a regular exercise program discussed extensively with patient.   - Ambulatory blood pressure monitoring encouraged at least 3 times weekly.  Keep log and bring in every office visit.  Reminded patient that if they ever feel poorly in any way, to check their blood pressure and pulse.  - We will continue to monitor.  Recommendations - Return near future for lab work as indicated, metabolic panel, kidney function, electrolytes, CBC with differential and anemia panel as discussed. - Return OV in 3 months. - Patient will let us know if her symptoms worsen or do not improve as expected.   - As part of my medical decision making, I reviewed the following data within the [place] History obtained from pt /family, CMA notes reviewed and incorporated if applicable, Labs reviewed, Radiograph/ tests reviewed if applicable and OV notes from prior OV's with me, as well as other specialists she/he has seen since seeing me last, were all reviewed and used in my medical decision making process today.    - Additionally, discussion had with patient regarding our treatment plan, and their biases/concerns about that plan were used in my medical decision making today.     - The patient agreed with the plan and demonstrated an understanding of the instructions.   No barriers to understanding were identified.    - Red flag symptoms and signs discussed in detail.  Patient expressed understanding regarding what to do in case of emergency\ urgent symptoms.   - The patient was advised to call back or seek an in-person evaluation if the symptoms worsen or if the condition fails to improve as anticipated.   Return for labs, metabolic panel (kidney function, electrolytes), CBC-diff, and anemia panel ASAP, OV 3 mo.    Orders Placed This Encounter  Procedures  . Vitamin B12  . Folate  . Iron and TIBC  . Ferritin  . Reticulocytes  . Transferrin- now  . CBC with Diff/Plt  . CMP w GFR    I provided 22 minutes of non face-to-face time during this encounter.  Additional time was spent with charting and coordination of care before and after the actual visit commenced.   Note:  This note was prepared with assistance of Dragon voice recognition software. Occasional wrong-word or sound-a-like  substitutions may have occurred due to the inherent limitations of voice recognition software.  This document serves as a record of services personally performed by Cristina Dance, DO. It was created on her behalf by Cristina Conner, a trained medical scribe. The creation of this record is based on the scribe's personal observations and the provider's statements to them.   This case required medical decision making of at least moderate complexity. The above documentation has been reviewed to be accurate and was completed by Marjory Sneddon, D.O.       Patient Care Team    Relationship Specialty Notifications Start End  Cristina Dance, DO PCP - General Family Medicine  01/02/18   Druscilla Brownie, MD Consulting Physician Dermatology  01/02/18   Opthamology, [facility]  Ophthalmology  01/02/18   Princess Bruins, MD Consulting Physician Obstetrics and Gynecology   03/04/18   Melrose Nakayama, MD Consulting Physician Orthopedic Surgery  08/04/19      -Vitals obtained; medications/ allergies reconciled;  personal medical, social, Sx etc.histories were updated by CMA, reviewed by me and are reflected in chart   Patient Active Problem List   Diagnosis Date Noted  . Pre-diabetes 03/09/2018  . Hypertension 05/20/2011  . Osteopenia 07/31/2016  . Postmenopausal state- went thru early 40's 01/02/2018  . Status post left hip replacement 09/07/2019  . 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 Primary localized osteoarthritis of left hip Sep 14, 2019  . Death of family member-  husband passed 04/29/2019  . 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 Sleep difficulties 09/09/2018  . Primary osteoarthritis of left hip 01/02/2018  . Caregiver burden- for husband with ALLeukemia 01/02/2018  . Left hip pain 01/02/2018  . Menopausal state 05/20/2011     Current Meds  Medication Sig  . aspirin EC 81 MG tablet Take 1 tablet (81 mg total) by mouth 2 (two) times daily after a meal.  . calcium citrate (CALCITRATE - DOSED IN MG ELEMENTAL CALCIUM) 950 MG tablet Take 200 mg of elemental calcium by mouth daily.  . cholecalciferol (VITAMIN D3) 25 MCG (1000 UT) tablet Take 1,000 Units by mouth daily.  Marland Kitchen lisinopril-hydrochlorothiazide (ZESTORETIC) 20-12.5 MG tablet TAKE 1 TABLET BY MOUTH EVERY DAY  . Misc Natural Products (TART CHERRY ADVANCED PO) Take 1 capsule by mouth daily.  . TURMERIC PO Take 1 tablet by mouth daily.     Allergies:  No Known Allergies   ROS:  See above HPI for pertinent positives and negatives   Objective:   Blood pressure 138/82, pulse (!) 110, last menstrual period 05/01/2009.  (if some vitals are omitted, this means that patient was UNABLE to obtain them even though they were asked to get them prior to OV today.  They were asked to call us at their earliest convenience with these once obtained. )  General: A & O * 3; sounds in no acute distress; in usual state  of health.  Skin: Pt confirms warm and dry extremities and pink fingertips HEENT: Pt confirms lips non-cyanotic Chest: Patient confirms normal chest excursion and movement Respiratory: speaking in full sentences, no conversational dyspnea; patient confirms no use of accessory muscles Psych: insight appears good, mood- appears full

## 2019-09-09 ENCOUNTER — Encounter: Payer: Self-pay | Admitting: Family Medicine

## 2019-09-09 ENCOUNTER — Other Ambulatory Visit: Payer: BC Managed Care – PPO

## 2019-09-09 ENCOUNTER — Other Ambulatory Visit: Payer: Self-pay

## 2019-09-09 DIAGNOSIS — R5383 Other fatigue: Secondary | ICD-10-CM

## 2019-09-09 DIAGNOSIS — D649 Anemia, unspecified: Secondary | ICD-10-CM

## 2019-09-09 DIAGNOSIS — R112 Nausea with vomiting, unspecified: Secondary | ICD-10-CM

## 2019-09-09 DIAGNOSIS — R000 Tachycardia, unspecified: Secondary | ICD-10-CM

## 2019-09-09 DIAGNOSIS — Z9889 Other specified postprocedural states: Secondary | ICD-10-CM

## 2019-09-09 DIAGNOSIS — Z96642 Presence of left artificial hip joint: Secondary | ICD-10-CM

## 2019-09-10 LAB — COMPREHENSIVE METABOLIC PANEL
ALT: 9 IU/L (ref 0–32)
AST: 11 IU/L (ref 0–40)
Albumin/Globulin Ratio: 2.1 (ref 1.2–2.2)
Albumin: 4.4 g/dL (ref 3.8–4.9)
Alkaline Phosphatase: 114 IU/L (ref 39–117)
BUN/Creatinine Ratio: 18 (ref 9–23)
BUN: 16 mg/dL (ref 6–24)
Bilirubin Total: 0.3 mg/dL (ref 0.0–1.2)
CO2: 21 mmol/L (ref 20–29)
Calcium: 9.7 mg/dL (ref 8.7–10.2)
Chloride: 101 mmol/L (ref 96–106)
Creatinine, Ser: 0.88 mg/dL (ref 0.57–1.00)
GFR calc Af Amer: 84 mL/min/{1.73_m2} (ref >59)
GFR calc non Af Amer: 73 mL/min/{1.73_m2} (ref >59)
Globulin, Total: 2.1 g/dL (ref 1.5–4.5)
Glucose: 136 mg/dL — ABNORMAL HIGH (ref 65–99)
Potassium: 4 mmol/L (ref 3.5–5.2)
Sodium: 138 mmol/L (ref 134–144)
Total Protein: 6.5 g/dL (ref 6.0–8.5)

## 2019-09-10 LAB — IRON AND TIBC
Iron Saturation: 11 % — ABNORMAL LOW (ref 15–55)
Iron: 42 ug/dL (ref 27–159)
Total Iron Binding Capacity: 366 ug/dL (ref 250–450)
UIBC: 324 ug/dL (ref 131–425)

## 2019-09-10 LAB — FERRITIN: Ferritin: 24 ng/mL (ref 15–150)

## 2019-09-10 LAB — CBC WITH DIFFERENTIAL/PLATELET
Basophils Absolute: 0.1 10*3/uL (ref 0.0–0.2)
Basos: 1 %
EOS (ABSOLUTE): 0.3 10*3/uL (ref 0.0–0.4)
Eos: 5 %
Hematocrit: 36.8 % (ref 34.0–46.6)
Hemoglobin: 11.7 g/dL (ref 11.1–15.9)
Immature Grans (Abs): 0 10*3/uL (ref 0.0–0.1)
Immature Granulocytes: 0 %
Lymphocytes Absolute: 1.6 10*3/uL (ref 0.7–3.1)
Lymphs: 22 %
MCH: 26.1 pg — ABNORMAL LOW (ref 26.6–33.0)
MCHC: 31.8 g/dL (ref 31.5–35.7)
MCV: 82 fL (ref 79–97)
Monocytes Absolute: 0.4 10*3/uL (ref 0.1–0.9)
Monocytes: 5 %
Neutrophils Absolute: 4.8 10*3/uL (ref 1.4–7.0)
Neutrophils: 67 %
Platelets: 509 10*3/uL — ABNORMAL HIGH (ref 150–450)
RBC: 4.48 x10E6/uL (ref 3.77–5.28)
RDW: 13 % (ref 11.7–15.4)
WBC: 7.2 10*3/uL (ref 3.4–10.8)

## 2019-09-10 LAB — TRANSFERRIN: Transferrin: 306 mg/dL (ref 192–364)

## 2019-09-10 LAB — RETICULOCYTES: Retic Ct Pct: 2.6 % (ref 0.6–2.6)

## 2019-09-10 LAB — FOLATE: Folate: 5.7 ng/mL (ref >3.0)

## 2019-09-10 LAB — VITAMIN B12: Vitamin B-12: 423 pg/mL (ref 232–1245)

## 2019-09-13 ENCOUNTER — Encounter: Payer: Self-pay | Admitting: Family Medicine

## 2019-09-16 ENCOUNTER — Encounter: Payer: Self-pay | Admitting: Family Medicine

## 2019-09-17 ENCOUNTER — Telehealth: Payer: Self-pay | Admitting: Family Medicine

## 2019-09-17 NOTE — Telephone Encounter (Signed)
Called patient and advised that we would need to see this patient to evaluate either by doxy or in person appointment. Call transferred to the front desk for scheduling.  AS, CMA

## 2019-09-20 ENCOUNTER — Encounter: Payer: Self-pay | Admitting: Family Medicine

## 2019-09-20 ENCOUNTER — Other Ambulatory Visit: Payer: Self-pay

## 2019-09-20 ENCOUNTER — Ambulatory Visit (INDEPENDENT_AMBULATORY_CARE_PROVIDER_SITE_OTHER): Payer: BC Managed Care – PPO | Admitting: Family Medicine

## 2019-09-20 VITALS — BP 120/84 | HR 97 | Temp 97.5°F | Resp 12 | Ht 66.0 in | Wt 144.7 lb

## 2019-09-20 DIAGNOSIS — R1013 Epigastric pain: Secondary | ICD-10-CM

## 2019-09-20 DIAGNOSIS — R112 Nausea with vomiting, unspecified: Secondary | ICD-10-CM | POA: Diagnosis not present

## 2019-09-20 DIAGNOSIS — K30 Functional dyspepsia: Secondary | ICD-10-CM | POA: Diagnosis not present

## 2019-09-20 DIAGNOSIS — R142 Eructation: Secondary | ICD-10-CM

## 2019-09-20 MED ORDER — ONDANSETRON 8 MG PO TBDP: 8.0000 mg | ORAL_TABLET | Freq: Three times a day (TID) | ORAL | 0 refills | Status: DC | PRN

## 2019-09-20 MED ORDER — FAMOTIDINE 40 MG PO TABS: ORAL_TABLET | ORAL | 1 refills | Status: DC

## 2019-09-20 MED ORDER — OMEPRAZOLE 20 MG PO CPDR: DELAYED_RELEASE_CAPSULE | ORAL | 1 refills | Status: DC

## 2019-09-20 NOTE — Progress Notes (Signed)
Pt here for an acute care OV today  Impression and Recommendations:    1. Epigastric abdominal pain   2. Nausea and vomiting, intractability of vomiting not specified, unspecified vomiting type   3. Indigestion   4. Burping     Post-Surgical Nausea, Vomiting, Epigastric Abdominal Pain, Indigestion, Burping - Patient with unknown reason for nausea, vomiting for 4 weeks ever since hip surgery December 15th. - Discussed patient's symptoms extensively during appointment today.    - Reviewed that patient's physical was completely normal during examination today which is reassuring but still with unknown etiology to sx.  - Advised that increased stress (such as post-surgical stress) can lead to increased gastric acid.  This, in addition to use of meloxicam for the past six months, and prior use of NSAID's, can contribute to increased risk of gastric ulcer.  - Continue to avoid use of NSAID's.  - Reviewed need for further testing and assessment.  - Discussed need for ambulatory referral to gastroenterology.  Reviewed that specialist will be able to obtain targeted screenings and scopes (EGD) as needed.  Explained to patient about additional intervention she may need.  - Ambulatory referral to gastroenterology placed today.  - Additional lab work ordered today.  - d/c pt that we can order abdominal ultrasound today.  Discussed with patient that this will not likely lead to a definitive reason for symptoms or anything but is a good for start.  Also would be good to have it done so that patient can review results of ultrasound with GI doc.  -Explained to patient that upper endoscopy may be needed +/- abd CT but will be the decision of the GI provider on next best steps  - Will do H. pylori stool screen.  Order placed today.  Education provided to patient today regarding purpose of this screening.  - Begin two medications for reflux/heartburn, as in the case of peptic ulcer.  See med  list.  - Encouraged patient to hydrate adequately.  - Continue plain foods such as plain mashed potatoes, plain rice.  Avoid use of butter or dairy, avoid spicy foods, fatty foods, fried foods.  - Discussed critical need to reduce stress.  Reviewed the "spokes of the wheel" of mood and stress management.  Stressed the importance of ongoing prudent habits, including regular exercise, appropriate sleep hygiene, healthful dietary habits, and prayer/meditation to relax.  - Encouraged patient to reschedule follow-up with therapist for support with stress management and surgical recovery.  - Will continue to monitor closely alongside GI-but patient understands gastroenterology would be best to further evaluate her symptoms at this time  - Patient will call the clinic if symptoms worsen or anything changes.  - Patient knows to go the emergency room if acutely W for some reason/ needed.   Return if symptoms worsen or fail to improve, for F-up of current med issues as previously discussed and as needed.   Meds ordered this encounter  Medications  . ondansetron (ZOFRAN-ODT) 8 MG disintegrating tablet    Sig: Take 1 tablet (8 mg total) by mouth every 8 (eight) hours as needed for nausea or refractory nausea / vomiting.    Dispense:  20 tablet    Refill:  0  . omeprazole (PRILOSEC) 20 MG capsule    Sig: Take 1 capsule (20 mg total) by mouth 2 (two) times daily before a meal for 14 days, THEN 1 capsule (20 mg total) daily. Refill per GI.    Dispense:  90 capsule    Refill:  1  . DISCONTD: famotidine (PEPCID) 40 MG tablet    Sig: Take 1 tablet (40 mg total) by mouth 2 (two) times daily for 14 days, THEN 1 tablet (40 mg total) at bedtime. Refill per GI.    Dispense:  90 tablet    Refill:  1    Medications Discontinued During This Encounter  Medication Reason  . TURMERIC PO Error  . tiZANidine (ZANAFLEX) 4 MG tablet Error  . Misc Natural Products (TART CHERRY ADVANCED PO) Error  .  HYDROcodone-acetaminophen (NORCO/VICODIN) 5-325 MG tablet Error  . eszopiclone (LUNESTA) 1 MG TABS tablet Error  . cholecalciferol (VITAMIN D3) 25 MCG (1000 UT) tablet Error  . calcium citrate (CALCITRATE - DOSED IN MG ELEMENTAL CALCIUM) 950 MG tablet Error  . aspirin EC 81 MG tablet Error  . ZOFRAN 4 MG tablet   . omeprazole (PRILOSEC) 20 MG capsule      Orders Placed This Encounter  Procedures  . US Abdomen Limited  . H. pylori antigen, stool  . CBC with Differential/Platelet  . Comprehensive metabolic panel  . Bilirubin, fractionated(tot/dir/indir)  . Gamma GT  . Lipase  . Amylase  . Lactate Dehydrogenase (LDH)  . Ambulatory referral to Gastroenterology     Education and routine counseling performed. Handouts provided  Gross side effects, risk and benefits, and alternatives of medications and treatment plan in general discussed with patient.  Patient is aware that all medications have potential side effects and we are unable to predict every side effect or drug-drug interaction that may occur.   Patient will call with any questions prior to using medication if they have concerns.    Expresses verbal understanding and consents to current therapy and treatment regimen.  No barriers to understanding were identified.  Red flag symptoms and signs discussed in detail.  Patient expressed understanding regarding what to do in case of emergency\urgent symptoms   Please see AVS handed out to patient at the end of our visit for further patient instructions/ counseling done pertaining to today's office visit.   Return if symptoms worsen or fail to improve, for F-up of current med issues as previously discussed and as needed.     Note:  This document was prepared occasionally using Dragon voice recognition software and may include unintentional dictation errors in addition to a scribe.  This document serves as a record of services personally performed by Mellody Dance, DO. It was  created on her behalf by [name], a trained medical scribe. The creation of this record is based on the scribe's personal observations and the provider's statements to them.   This case required medical decision making of at least moderate complexity. The above documentation has been reviewed to be accurate and was completed by Marjory Sneddon, D.O.    --------------------------------------------------------------------------------------------------------------------------------------------------------------------------------------------------------------------------------------------  Subjective:    Cristina Conner, am serving as scribe for Dr. Mellody Dance.  CC:  Chief Complaint  Patient presents with  . Abdominal Pain  . Emesis    HPI: Cristina Conner is a 59 y.o. female who presents to [place] at [facility] today for issues as discussed below.   Patient's last OV was on September 07, 2019, in which she continued to experience post-surgical nausea, vomiting, and fatigue.  HTN:  Notes her blood pressure at home is controlled, running 139/low 80's.  - Abdominal Pain & Vomiting Since Hip Surgery In the past three days, she has not vomited.  Currently taking Zofran 4mg  prn and Nexium daily.  Had her surgery on August 17, 2019, and her symptoms (nausea, vomiting) began on the 20th.  "I've been having it ever since, not all day long, normally during the day time, and usually after I eat."  Says "it's like a stabbing pain underneath my ribcage," confirms in the center of her upper abdomen.  Patient indicates the epigastric region during appointment.  Since her stomach pain occurs after eating, she started eliminating things from her diet and eating more bland foods, including crackers, chicken noodle soup, "less chicken and no hard meats at all, I can't digest those."   Says "it feels like someone has a knife, jabbing it into my abdominal  area, and it continues for a long period of time, hours, until I vomit."  "I have nausea pretty much all the time, and then after I eat, because I have to eat something, I have the stabbing pain for hours."  Notes this pain causes her to "bend over double" from the cramping.  "And then, all of a sudden, I just say oh gosh I've gotta vomit."  At those times, she runs to the bathroom and throws up and feels better 30 minutes later.  She thinks the vomiting occurs every other day, but the past three days, she has not vomited.  "Sometimes it's every day, but at least every other day."  She has never had pain like this before, never before the surgery.   Denies radiation of pain.  Denies positions that make the pain better.  Thinks the pain is worse when she lies on her back.  For pain relief after surgery, she has been using tylenol every now and then, and no NSAID's.  Prior to surgery, she took NSAID's in the past, "before I got placed on meloxicam."  She stopped taking Alleve after she was told to take meloxicam instead.  She took meloxicam daily for six months prior to surgery.  She was not taking Zantac or anything to protect her stomach alongside the meloxicam.  Says "I have to burp a lot; I have a lot of indigestion."  Confirms that her stomach hurts often.  Denies feelings of unusual fullness in stomach.  She began taking Nexium OTC about four days ago.  She is taking one tablet every 24 hours.  Notes "I haven't thrown up, so maybe it has helped."  She says she still has feelings of indigestion.  Notes she can't do physical therapy for her surgery at present because of the GI symptoms.  She is also concerned about her heart rate, though it is normal in office today.  Notes she feels she is dehydrated.  Says she drinks liquids during the day, but "I feel like I don't get enough."  Has about four water bottles during the day.  Notes she drinks water usually, and a sprite occasionally to help her  burp.  Notes the burping "feels so good."  She is established with a psycho-therapist.    -  HPI Last OV: Recent Hip Replacement Surgery - Left Arthroplasty  Says she does feel stressed, but also "really exhausted and tired," and has had "some other issues too."  Denies fever, denies chills.  She is unsure if she's been drinking enough water, but notes she started drinking Pedialyte as well because she hasn't had much of an appetite.  Notes she's also vomiting, but that she has not vomited in the past 3-4 days.  Notes she vomited  on the 17th after her surgery, and three other times since then.  She called her surgical doctor and told them she was vomiting; patient thought it was related maybe to taking hydrocodone.  She was told to cut her pain medication in half, and obtained a prescription for Zofran.  She continued vomiting despite this.  She stopped taking hydrocodone afterward.  Even without the pain medication, notes she really has no pain with her hip, no concerns with this.  Notes if she could just get a handle on her nauasea and weakness, she thinks she would feel better.  Notes she also feels dizzy time to time when standing.  She has been to physical therapy twice.   Wt Readings from Last 3 Encounters:  09/21/19 140 lb (63.5 kg)  09/20/19 144 lb 11.2 oz (65.6 kg)  08/21/2019 140 lb (63.5 kg)   BP Readings from Last 3 Encounters:  09/21/19 120/70  09/20/19 120/84  09/07/19 138/82   BMI Readings from Last 3 Encounters:  09/21/19 22.60 kg/m  09/20/19 23.36 kg/m  August 21, 2019 22.60 kg/m     Patient Care Team    Relationship Specialty Notifications Start End  Mellody Dance, DO PCP - General Family Medicine  01/02/18   Druscilla Brownie, MD Consulting Physician Dermatology  01/02/18   Opthamology, [facility]  Ophthalmology  01/02/18   Princess Bruins, MD Consulting Physician Obstetrics and Gynecology  03/04/18   Melrose Nakayama, MD Consulting Physician Orthopedic  Surgery  08/04/19      Patient Active Problem List   Diagnosis Date Noted  . Pre-diabetes 03/09/2018  . Hypertension 05/20/2011  . Osteopenia 07/31/2016  . Postmenopausal state- went thru early 40's 01/02/2018  . Status post left hip replacement 09/07/2019  . 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 Primary localized osteoarthritis of left hip 21-Aug-2019  . Death of family member-  husband passed 04/29/2019  . 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 Sleep difficulties 09/09/2018  . Primary osteoarthritis of left hip 01/02/2018  . Caregiver burden- for husband with ALLeukemia 01/02/2018  . Left hip pain 01/02/2018  . Menopausal state 05/20/2011      Past Medical History:  Diagnosis Date  . Arthritis   . Hypertension   . Osteopenia   . Post-operative nausea and vomiting    vomiting after general anesthesia per pt.  . Pre-diabetes      Past Surgical History:  Procedure Laterality Date  . CESAREAN SECTION     x 2  . ENDOMETRIAL ABLATION    . PELVIC LAPAROSCOPY  1999   left salpingectomy, excision of Right, paratubal cyst  . PELVIC LAPAROSCOPY     laparoscopy with lysis of pelvic adhesions  . SHOULDER SURGERY  11/2010   "FROZEN SHOULDER"  . TOTAL HIP ARTHROPLASTY Left 08/21/2019   Procedure: LEFT TOTAL HIP ARTHROPLASTY ANTERIOR APPROACH;  Surgeon: Melrose Nakayama, MD;  Location: [facility];  Service: Orthopedics;  Laterality: Left;  . TUBAL LIGATION       Family History  Problem Relation Age of Onset  . Hypertension Mother   . Heart disease Mother   . Diabetes Mother   . Hypertension Father   . Colon cancer Neg Hx      Social History   Socioeconomic History  . Marital status: Widowed    Spouse name: Not on file  . Number of children: Not on file  . Years of education: Not on file  . Highest education level: Not on file  Occupational History  . Not on file  Tobacco  Use  . Smoking status: Never Smoker  . Smokeless tobacco: Never Used  Substance and Sexual Activity  . Alcohol  use: Not Currently  . Drug use: No  . Sexual activity: Not Currently    Partners: Male    Birth control/protection: Post-menopausal    Comment: 1st intercourse- 37, partners- 39,  married- 16 yrs   Other Topics Concern  . Not on file  Social History Narrative  . Not on file   Social Determinants of Health   Financial Resource Strain:   . Difficulty of Paying Living Expenses: Not on file  Food Insecurity:   . Worried About Charity fundraiser in the Last Year: Not on file  . Ran Out of Food in the Last Year: Not on file  Transportation Needs:   . Lack of Transportation (Medical): Not on file  . Lack of Transportation (Non-Medical): Not on file  Physical Activity:   . Days of Exercise per Week: Not on file  . Minutes of Exercise per Session: Not on file  Stress:   . Feeling of Stress : Not on file  Social Connections:   . Frequency of Communication with Friends and Family: Not on file  . Frequency of Social Gatherings with Friends and Family: Not on file  . Attends Religious Services: Not on file  . Active Member of Clubs or Organizations: Not on file  . Attends Archivist Meetings: Not on file  . Marital Status: Not on file  Intimate Partner Violence:   . Fear of Current or Ex-Partner: Not on file  . Emotionally Abused: Not on file  . Physically Abused: Not on file  . Sexually Abused: Not on file     Current Meds  Medication Sig  . lisinopril-hydrochlorothiazide (ZESTORETIC) 20-12.5 MG tablet TAKE 1 TABLET BY MOUTH EVERY DAY  . [DISCONTINUED] omeprazole (PRILOSEC) 20 MG capsule Take 20 mg by mouth daily.  . [DISCONTINUED] ZOFRAN 4 MG tablet Take 4 mg by mouth every 6 (six) hours as needed.    Allergies:  Allergies  Allergen Reactions  . Hydrocodone Nausea And Vomiting     Review of Systems: General:   Denies fever, chills, unexplained weight loss.  Optho/Auditory:   Denies visual changes, blurred vision/LOV Respiratory:   Denies wheeze, DOE more than  baseline levels.   Cardiovascular:   Denies chest pain, palpitations, new onset peripheral edema  Gastrointestinal:   Denies nausea, vomiting, diarrhea, abd pain.  Genitourinary: Denies dysuria, freq/ urgency, flank pain or discharge from genitals.  Endocrine:     Denies hot or cold intolerance, polyuria, polydipsia. Musculoskeletal:   Denies unexplained myalgias, joint swelling, unexplained arthralgias, gait problems.  Skin:  Denies new onset rash, suspicious lesions Neurological:     Denies dizziness, unexplained weakness, numbness  Psychiatric/Behavioral:   Denies mood changes, suicidal or homicidal ideations, hallucinations    Objective:   Blood pressure 120/84, pulse 97, temperature (!) 97.5 F (36.4 C), temperature source Oral, resp. rate 12, height 5\' 6"  (1.676 m), weight 144 lb 11.2 oz (65.6 kg), last menstrual period 05/01/2009, SpO2 100 %. Body mass index is 23.36 kg/m. General:  Well Developed, well nourished, appropriate for stated age.  Neuro:  Alert and oriented,  extra-ocular muscles intact  HEENT:  Normocephalic, atraumatic, neck supple Abdominal: Soft, non-tender, bowel sounds active all four quadrants, NO   G/R/R, no masses, no organomegaly Skin:  no gross rash, warm, pink. Cardiac:  RRR, S1 S2 Respiratory:  ECTA B/L and A/P, Not  using accessory muscles, speaking in full sentences- unlabored. Vascular:  Ext warm, no cyanosis apprec.; cap RF less 2 sec. Psych:  No HI/SI, judgement and insight good, Euthymic mood. Full Affect.

## 2019-09-20 NOTE — Patient Instructions (Addendum)
Avoid foods that are spicy, fried, fatty, or incorporate milk/dairy.     Peptic Ulcer  A peptic ulcer is a sore in the lining of the stomach (gastric ulcer) or the first part of the small intestine (duodenal ulcer). The ulcer causes a gradual wearing away (erosion) of the deeper tissue. What are the causes? Normally, the lining of the stomach and the small intestine protects them from the acid that digests food. The protective lining can be damaged by:  An infection caused by a type of bacteria called Helicobacter pylori or H. pylori.  Regular use of NSAIDs, such as ibuprofen or aspirin.  Rare tumors in the stomach, small intestine, or pancreas (Zollinger-Ellison syndrome). What increases the risk? The following factors may make you more likely to develop this condition:  Smoking.  Having a family history of ulcer disease.  Drinking alcohol.  Having been hospitalized in an intensive care unit (ICU). What are the signs or symptoms? Symptoms of this condition include:  Persistent burning pain in the area between the chest and the belly button. The pain may be worse on an empty stomach and at night.  Heartburn.  Nausea and vomiting.  Bloating. If the ulcer results in bleeding, it can cause:  Black, tarry stools.  Vomiting of bright red blood.  Vomiting of material that looks like coffee grounds. How is this diagnosed? This condition may be diagnosed based on:  Your medical history and a physical exam.  Various tests or procedures, such as: ? Blood tests, stool tests, or breath tests to check for the H. pylori bacteria. ? An X-ray exam (upper gastrointestinal series) of the esophagus, stomach, and small intestine. ? Upper endoscopy. The health care provider examines the esophagus, stomach, and small intestine using a small flexible tube that has a video camera at the end. ? Biopsy. A tissue sample is removed to be examined under a microscope. How is this  treated? Treatment for this condition may include:  Eliminating the cause of the ulcer, such as smoking or use of NSAIDs, and limiting alcohol and caffeine intake.  Medicines to reduce the amount of acid in your digestive tract.  Antibiotic medicines, if the ulcer is caused by an H. pylori infection.  An upper endoscopy may be used to treat a bleeding ulcer.  Surgery. This may be needed if the bleeding is severe or if the ulcer created a hole somewhere in the digestive system. Follow these instructions at home:  Do not drink alcohol if your health care provider tells you not to drink.  Do not use any products that contain nicotine or tobacco, such as cigarettes, e-cigarettes, and chewing tobacco. If you need help quitting, ask your health care provider.  Take over-the-counter and prescription medicines only as told by your health care provider. ? Do not use over-the-counter medicines in place of prescription medicines unless your health care provider approves. ? Do not take aspirin, ibuprofen, or other NSAIDs unless your health care provider told you to do so.  Take over-the-counter and prescription medicines only as told by your health care provider.  Keep all follow-up visits as told by your health care provider. This is important. Contact a health care provider if:  Your symptoms do not improve within 7 days of starting treatment.  You have ongoing indigestion or heartburn. Get help right away if:  You have sudden, sharp, or persistent pain in your abdomen.  You have bloody or dark black, tarry stools.  You vomit blood or material  that looks like coffee grounds.  You become light-headed or you feel faint.  You become weak.  You become sweaty or clammy. Summary  A peptic ulcer is a sore in the lining of the stomach (gastric ulcer) or the first part of the small intestine (duodenal ulcer). The ulcer causes a gradual wearing away (erosion) of the deeper tissue.  Do not  use any products that contain nicotine or tobacco, such as cigarettes, e-cigarettes, and chewing tobacco. If you need help quitting, ask your health care provider.  Take over-the-counter and prescription medicines only as told by your health care provider. Do not use over-the-counter medicines in place of prescription medicines unless your health care provider approves.  Contact your health care provider if you have ongoing indigestion or heartburn.  Keep all follow-up visits as told by your health care provider. This is important. This information is not intended to replace advice given to you by your health care provider. Make sure you discuss any questions you have with your health care provider. Document Revised: 02/24/2018 Document Reviewed: 02/24/2018 Elsevier Patient Education  [place].

## 2019-09-21 ENCOUNTER — Encounter: Payer: Self-pay | Admitting: Physician Assistant

## 2019-09-21 ENCOUNTER — Ambulatory Visit: Payer: BC Managed Care – PPO | Admitting: Physician Assistant

## 2019-09-21 ENCOUNTER — Other Ambulatory Visit (INDEPENDENT_AMBULATORY_CARE_PROVIDER_SITE_OTHER): Payer: BC Managed Care – PPO

## 2019-09-21 ENCOUNTER — Other Ambulatory Visit: Payer: BC Managed Care – PPO

## 2019-09-21 VITALS — BP 120/70 | HR 103 | Temp 97.4°F | Ht 66.0 in | Wt 140.0 lb

## 2019-09-21 DIAGNOSIS — R110 Nausea: Secondary | ICD-10-CM | POA: Diagnosis not present

## 2019-09-21 DIAGNOSIS — R1013 Epigastric pain: Secondary | ICD-10-CM

## 2019-09-21 LAB — H. PYLORI ANTIBODY, IGG: H Pylori IgG: NEGATIVE

## 2019-09-21 MED ORDER — ONDANSETRON HCL 4 MG PO TABS: 4.0000 mg | ORAL_TABLET | Freq: Four times a day (QID) | ORAL | 1 refills | Status: DC | PRN

## 2019-09-21 NOTE — Progress Notes (Signed)
Subjective:    Patient ID: Cristina Conner, female    DOB: [birth year], 59 y.o.   MRN: [id number]  HPI Jonte is a pleasant 59 year old white female, established with Dr. Carlean Purl with history of diverticulosis, hypertension and osteoarthritis.  She is status post left hip replacement on 08/28/2020.  She comes in today with complaints of epigastric pain and nausea. Patient had colonoscopy in February 2014 which was pertinent for scattered diverticulosis of the ascending colon, no polyps.  She has not had prior EGD. Patient says she had been taking meloxicam for about 6 months regularly prior to her hip replacement.  She has not been taking any anti-inflammatories over the past few weeks.  She says about 5 days after her surgery she started having problems with nausea and then this gradually progressed to which she describes as a stabbing epigastric pain that occurs with p.o. intake.  This is been associated with nausea.  She has had a few episodes of vomiting off and on though none over the past 4 days.  She started taking over-the-counter Nexium once daily about 5 days ago.  She describes a lot of burping and some burning discomfort.  No heartburn or indigestion no dysphagia or odynophagia.  She has a prescription for Zofran but has not been using it. She was seen by her PCP yesterday and multiple labs were ordered and are still pending.  She says she thought she was supposed to be scheduled for an upper abdominal ultrasound.  Prescription for omeprazole was called in but she did not pick it up as yet. She says actually over the past couple of days she thinks she is feeling a bit better  Review of Systems Pertinent positive and negative review of systems were noted in the above HPI section.  All other review of systems was otherwise negative.  Outpatient Encounter Medications as of 09/21/2019  Medication Sig  . lisinopril-hydrochlorothiazide (ZESTORETIC) 20-12.5 MG tablet TAKE 1 TABLET BY MOUTH EVERY  DAY  . omeprazole (PRILOSEC) 20 MG capsule Take 1 capsule (20 mg total) by mouth 2 (two) times daily before a meal for 14 days, THEN 1 capsule (20 mg total) daily. Refill per GI.  Marland Kitchen ondansetron (ZOFRAN-ODT) 8 MG disintegrating tablet Take 1 tablet (8 mg total) by mouth every 8 (eight) hours as needed for nausea or refractory nausea / vomiting.  . ondansetron (ZOFRAN) 4 MG tablet Take 1 tablet (4 mg total) by mouth every 6 (six) hours as needed for nausea or vomiting.  . [DISCONTINUED] famotidine (PEPCID) 40 MG tablet Take 1 tablet (40 mg total) by mouth 2 (two) times daily for 14 days, THEN 1 tablet (40 mg total) at bedtime. Refill per GI.   No facility-administered encounter medications on file as of 09/21/2019.   Allergies  Allergen Reactions  . Hydrocodone Nausea And Vomiting   Patient Active Problem List   Diagnosis Date Noted  . Status post left hip replacement 09/07/2019  . 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 Primary localized osteoarthritis of left hip 08-29-19  . Death of family member-  husband passed 04/29/2019  . 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 Sleep difficulties 09/09/2018  . Pre-diabetes 03/09/2018  . Postmenopausal state- went thru early 40's 01/02/2018  . Primary osteoarthritis of left hip 01/02/2018  . Caregiver burden- for husband with ALLeukemia 01/02/2018  . Left hip pain 01/02/2018  . Osteopenia 07/31/2016  . Hypertension 05/20/2011  . Menopausal state 05/20/2011   Social History   Socioeconomic History  . Marital  status: Widowed    Spouse name: Not on file  . Number of children: Not on file  . Years of education: Not on file  . Highest education level: Not on file  Occupational History  . Not on file  Tobacco Use  . Smoking status: Never Smoker  . Smokeless tobacco: Never Used  Substance and Sexual Activity  . Alcohol use: Not Currently  . Drug use: No  . Sexual activity: Not Currently    Partners: Male    Birth control/protection: Post-menopausal     Comment: 1st intercourse- 57, partners- 44,  married- 64 yrs   Other Topics Concern  . Not on file  Social History Narrative  . Not on file   Social Determinants of Health   Financial Resource Strain:   . Difficulty of Paying Living Expenses: Not on file  Food Insecurity:   . Worried About Charity fundraiser in the Last Year: Not on file  . Ran Out of Food in the Last Year: Not on file  Transportation Needs:   . Lack of Transportation (Medical): Not on file  . Lack of Transportation (Non-Medical): Not on file  Physical Activity:   . Days of Exercise per Week: Not on file  . Minutes of Exercise per Session: Not on file  Stress:   . Feeling of Stress : Not on file  Social Connections:   . Frequency of Communication with Friends and Family: Not on file  . Frequency of Social Gatherings with Friends and Family: Not on file  . Attends Religious Services: Not on file  . Active Member of Clubs or Organizations: Not on file  . Attends Archivist Meetings: Not on file  . Marital Status: Not on file  Intimate Partner Violence:   . Fear of Current or Ex-Partner: Not on file  . Emotionally Abused: Not on file  . Physically Abused: Not on file  . Sexually Abused: Not on file    Ms. Frick's family history includes Diabetes in her mother; Heart disease in her mother; Hypertension in her father and mother.      Objective:    Vitals:   09/21/19 0915  BP: 120/70  Pulse: (!) 103  Temp: (!) 97.4 F (36.3 C)    Physical Exam Well-developed well-nourished female/female in no acute distress.  Height, Weight140, BMI 22.6.  Ambulating with a cane HEENT; nontraumatic normocephalic, EOMI, PER R LA, sclera anicteric. Oropharynx; not examined/mass/Covid Neck; supple, no JVD Cardiovascular; regular rate and rhythm with S1-S2, no murmur rub or gallop Pulmonary; Clear bilaterally Abdomen; soft, mildly tender epigastrium,, nondistended, no palpable mass or hepatosplenomegaly, bowel  sounds are active Rectal; not done today Skin; benign exam, no jaundice rash or appreciable lesions Extremities; no clubbing cyanosis or edema skin warm and dry Neuro/Psych; alert and oriented x4, grossly nonfocal mood and affect appropriate       Assessment & Plan:   #77 59 year old white female status post left hip replacement on 08/17/2019 with onset of nausea about 5 days later gradually progressing to nausea, stabbing epigastric pain worse postprandially and intermittent vomiting. Patient started OTC Nexium about 5 days ago and is currently feeling a bit better. Etiology of symptoms is not clear, suspect gastritis or peptic ulcer disease, probably NSAID induced.  Will need to rule out gallbladder disease.  Rule out H. Pylori.  #2 colon cancer surveillance-up-to-date with negative colonoscopy 2014.  Due for follow-up 2024 3.  Diverticulosis  Plan; start omeprazole 20 mg p.o. twice  daily x14 days then every morning thereafter. Avoid all aspirin and NSAIDs. We have scheduled her for upper abdominal ultrasound. Check H. pylori antibody. Will review pending labs.  Have asked patient to call back in about a week if she is not feeling significantly better and if remainder of work-up is negative she may need to be scheduled for EGD with Dr. Carlean Purl.  Kaedence Connelly Genia Harold PA-C 09/21/2019   Cc: Mellody Dance, DO

## 2019-09-21 NOTE — Patient Instructions (Addendum)
If you are age 58 or older, your body mass index should be between 23-30. Your Body mass index is 22.6 kg/m. If this is out of the aforementioned range listed, please consider follow up with your Primary Care Provider.  If you are age 82 or younger, your body mass index should be between 19-25. Your Body mass index is 22.6 kg/m. If this is out of the aformentioned range listed, please consider follow up with your Primary Care Provider.   Your provider has requested that you go to the basement level for lab work before leaving today. Press "B" on the elevator. The lab is located at the first door on the left as you exit the elevator.  You have been scheduled for an abdominal ultrasound at [facility] (1st floor of hospital) on Friday 09/24/19 at 11 am. Please arrive 15 minutes prior to your appointment for registration. Make certain not to have anything to eat or drink 8 hours prior to your appointment. Should you need to reschedule your appointment, please contact radiology at [phone]. This test typically takes about 30 minutes to perform.   We have sent the following medications to your pharmacy for you to pick up at your convenience: Zofran, Omeprazole.  Call office with an update in one week.

## 2019-09-22 LAB — CBC WITH DIFFERENTIAL/PLATELET
Basophils Absolute: 0.1 10*3/uL (ref 0.0–0.2)
Basos: 1 %
EOS (ABSOLUTE): 0.2 10*3/uL (ref 0.0–0.4)
Eos: 3 %
Hematocrit: 34.7 % (ref 34.0–46.6)
Hemoglobin: 11.1 g/dL (ref 11.1–15.9)
Immature Grans (Abs): 0 10*3/uL (ref 0.0–0.1)
Immature Granulocytes: 0 %
Lymphocytes Absolute: 1.8 10*3/uL (ref 0.7–3.1)
Lymphs: 30 %
MCH: 25.8 pg — ABNORMAL LOW (ref 26.6–33.0)
MCHC: 32 g/dL (ref 31.5–35.7)
MCV: 81 fL (ref 79–97)
Monocytes Absolute: 0.5 10*3/uL (ref 0.1–0.9)
Monocytes: 8 %
Neutrophils Absolute: 3.6 10*3/uL (ref 1.4–7.0)
Neutrophils: 58 %
Platelets: 318 10*3/uL (ref 150–450)
RBC: 4.3 x10E6/uL (ref 3.77–5.28)
RDW: 13.3 % (ref 11.7–15.4)
WBC: 6.1 10*3/uL (ref 3.4–10.8)

## 2019-09-22 LAB — COMPREHENSIVE METABOLIC PANEL
ALT: 8 IU/L (ref 0–32)
AST: 11 IU/L (ref 0–40)
Albumin/Globulin Ratio: 2.3 — ABNORMAL HIGH (ref 1.2–2.2)
Albumin: 4.4 g/dL (ref 3.8–4.9)
Alkaline Phosphatase: 87 IU/L (ref 39–117)
BUN/Creatinine Ratio: 7 — ABNORMAL LOW (ref 9–23)
BUN: 7 mg/dL (ref 6–24)
Bilirubin Total: 0.3 mg/dL (ref 0.0–1.2)
CO2: 24 mmol/L (ref 20–29)
Calcium: 9.8 mg/dL (ref 8.7–10.2)
Chloride: 104 mmol/L (ref 96–106)
Creatinine, Ser: 1.01 mg/dL — ABNORMAL HIGH (ref 0.57–1.00)
GFR calc Af Amer: 71 mL/min/{1.73_m2} (ref >59)
GFR calc non Af Amer: 62 mL/min/{1.73_m2} (ref >59)
Globulin, Total: 1.9 g/dL (ref 1.5–4.5)
Glucose: 102 mg/dL — ABNORMAL HIGH (ref 65–99)
Potassium: 3.9 mmol/L (ref 3.5–5.2)
Sodium: 142 mmol/L (ref 134–144)
Total Protein: 6.3 g/dL (ref 6.0–8.5)

## 2019-09-22 LAB — LIPASE: Lipase: 44 U/L (ref 14–72)

## 2019-09-22 LAB — LACTATE DEHYDROGENASE: LDH: 165 IU/L (ref 119–226)

## 2019-09-22 LAB — AMYLASE: Amylase: 88 U/L (ref 31–110)

## 2019-09-22 LAB — GAMMA GT: GGT: 19 IU/L (ref 0–60)

## 2019-09-22 LAB — BILIRUBIN, FRACTIONATED(TOT/DIR/INDIR)
Bilirubin, Direct: 0.09 mg/dL (ref 0.00–0.40)
Bilirubin, Indirect: 0.21 mg/dL (ref 0.10–0.80)

## 2019-09-24 ENCOUNTER — Ambulatory Visit (HOSPITAL_COMMUNITY)
Admission: RE | Admit: 2019-09-24 | Discharge: 2019-09-24 | Disposition: A | Discharge status: Home / Self Care | Payer: BC Managed Care – PPO | Source: Ambulatory Visit | Attending: Physician Assistant | Admitting: Physician Assistant

## 2019-09-24 ENCOUNTER — Other Ambulatory Visit: Payer: Self-pay

## 2019-09-24 DIAGNOSIS — R1013 Epigastric pain: Secondary | ICD-10-CM | POA: Insufficient documentation

## 2019-09-24 DIAGNOSIS — R110 Nausea: Secondary | ICD-10-CM | POA: Diagnosis present

## 2019-09-24 LAB — H. PYLORI ANTIGEN, STOOL: H pylori Ag, Stl: NEGATIVE

## 2019-09-27 NOTE — Progress Notes (Signed)
Please call patient and let her know the ultrasound was, no gallstones or gallbladder wall thickening, liver appears normal

## 2019-09-28 ENCOUNTER — Telehealth: Payer: Self-pay | Admitting: Physician Assistant

## 2019-09-29 ENCOUNTER — Other Ambulatory Visit: Payer: Self-pay

## 2019-09-29 DIAGNOSIS — Z1159 Encounter for screening for other viral diseases: Secondary | ICD-10-CM

## 2019-09-29 MED ORDER — HYOSCYAMINE SULFATE 0.125 MG SL SUBL: 0.1250 mg | SUBLINGUAL_TABLET | Freq: Four times a day (QID) | SUBLINGUAL | 1 refills | Status: DC | PRN

## 2019-09-29 NOTE — Telephone Encounter (Signed)
Patient has also sent a message through her My Chart portal.  [place], Yuleni [place] to [facility]     09/29/19 5:25 AM Received your message on yesterday. Called back to let you know that I'm still having the intense abdominal pains and vomiting. Pains began around 2:30 am Tuesday morning. Vomited on Tuesday and unable to eat anything all day.  The pains finally eased off around 11:30 pm.  Is there something that I can take to give more immediate relief when I'm having the intense stabbing pains? Thank you. This encounter is not signed. The conversation may still be ongoing.  Message forwarded to provider.

## 2019-09-29 NOTE — Telephone Encounter (Signed)
Spoke with the patient. See the patient message. She is scheduled for an EGD with Dr Carlean Purl. Levsin Rx sent to her pharmacy.

## 2019-10-04 HISTORY — PX: ESOPHAGOGASTRODUODENOSCOPY: SHX1529

## 2019-10-05 ENCOUNTER — Other Ambulatory Visit: Payer: Self-pay

## 2019-10-05 ENCOUNTER — Ambulatory Visit (AMBULATORY_SURGERY_CENTER): Payer: Self-pay | Admitting: *Deleted

## 2019-10-05 VITALS — Temp 97.4°F | Ht 66.0 in | Wt 140.4 lb

## 2019-10-05 DIAGNOSIS — Z01818 Encounter for other preprocedural examination: Secondary | ICD-10-CM

## 2019-10-05 DIAGNOSIS — R1013 Epigastric pain: Secondary | ICD-10-CM

## 2019-10-05 DIAGNOSIS — R110 Nausea: Secondary | ICD-10-CM

## 2019-10-05 NOTE — Progress Notes (Signed)
Pt is aware that care partner will wait in the car during procedure; if they feel like they will be too hot or cold to wait in the car; they may wait in the 4 th floor lobby. Patient is aware to bring only one care partner. We want them to wear a mask (we do not have any that we can provide them), practice social distancing, and we will check their temperatures when they get here.  I did remind the patient that their care partner needs to stay in the parking lot the entire time and have a cell phone available, we will call them when the pt is ready for discharge. Patient will wear mask into building.  Pt has PONV  No egg or soy allergy  No home oxygen use  No medications for weight loss taken  emmi information given  covid test 10-11-19 at 910

## 2019-10-07 ENCOUNTER — Encounter: Payer: Self-pay | Admitting: Internal Medicine

## 2019-10-09 ENCOUNTER — Other Ambulatory Visit: Payer: Self-pay | Admitting: Family Medicine

## 2019-10-11 ENCOUNTER — Ambulatory Visit (INDEPENDENT_AMBULATORY_CARE_PROVIDER_SITE_OTHER): Payer: BC Managed Care – PPO

## 2019-10-11 DIAGNOSIS — Z1159 Encounter for screening for other viral diseases: Secondary | ICD-10-CM

## 2019-10-11 LAB — SARS CORONAVIRUS 2 (TAT 6-24 HRS): SARS Coronavirus 2: NEGATIVE

## 2019-10-13 ENCOUNTER — Ambulatory Visit (AMBULATORY_SURGERY_CENTER): Payer: BC Managed Care – PPO | Admitting: Internal Medicine

## 2019-10-13 ENCOUNTER — Encounter: Payer: Self-pay | Admitting: Internal Medicine

## 2019-10-13 ENCOUNTER — Other Ambulatory Visit: Payer: Self-pay

## 2019-10-13 VITALS — BP 101/68 | HR 71 | Temp 97.1°F | Resp 13 | Ht 66.0 in | Wt 140.4 lb

## 2019-10-13 DIAGNOSIS — R1013 Epigastric pain: Secondary | ICD-10-CM

## 2019-10-13 DIAGNOSIS — R110 Nausea: Secondary | ICD-10-CM | POA: Diagnosis not present

## 2019-10-13 MED ORDER — SODIUM CHLORIDE 0.9 % IV SOLN: 500.0000 mL | Freq: Once | INTRAVENOUS | Status: DC

## 2019-10-13 NOTE — Patient Instructions (Addendum)
The esophagus, stomach and upper small intestine all look ok.  No more testing now - but if symptoms recur would look at the function of the gallbladder with a HIDA scan.  Hope you continue to be pain free.  I suggest stopping omeprazole at the end of February.  If you get recurrent symptoms let us know.  I appreciate the opportunity to care for you. Gatha Mayer, MD, FACG  YOU HAD AN ENDOSCOPIC PROCEDURE TODAY AT [facility]:   Refer to the procedure report that was given to you for any specific questions about what was found during the examination.  If the procedure report does not answer your questions, please call your gastroenterologist to clarify.  If you requested that your care partner not be given the details of your procedure findings, then the procedure report has been included in a sealed envelope for you to review at your convenience later.  YOU SHOULD EXPECT: Some feelings of bloating in the abdomen. Passage of more gas than usual.  Walking can help get rid of the air that was put into your GI tract during the procedure and reduce the bloating. If you had a lower endoscopy (such as a colonoscopy or flexible sigmoidoscopy) you may notice spotting of blood in your stool or on the toilet paper. If you underwent a bowel prep for your procedure, you may not have a normal bowel movement for a few days.  Please Note:  You might notice some irritation and congestion in your nose or some drainage.  This is from the oxygen used during your procedure.  There is no need for concern and it should clear up in a day or so.  SYMPTOMS TO REPORT IMMEDIATELY:    Following upper endoscopy (EGD)  Vomiting of blood or coffee ground material  New chest pain or pain under the shoulder blades  Painful or persistently difficult swallowing  New shortness of breath  Fever of 100F or higher  Black, tarry-looking stools  For urgent or emergent issues, a gastroenterologist can be  reached at any hour by calling [phone].   DIET:  We do recommend a small meal at first, but then you may proceed to your regular diet.  Drink plenty of fluids but you should avoid alcoholic beverages for 24 hours.  ACTIVITY:  You should plan to take it easy for the rest of today and you should NOT DRIVE or use heavy machinery until tomorrow (because of the sedation medicines used during the test).    FOLLOW UP: Our staff will call the number listed on your records 48-72 hours following your procedure to check on you and address any questions or concerns that you may have regarding the information given to you following your procedure. If we do not reach you, we will leave a message.  We will attempt to reach you two times.  During this call, we will ask if you have developed any symptoms of COVID 19. If you develop any symptoms (ie: fever, flu-like symptoms, shortness of breath, cough etc.) before then, please call [phone].  If you test positive for Covid 19 in the 2 weeks post procedure, please call and report this information to [place].    If any biopsies were taken you will be contacted by phone or by letter within the next 1-3 weeks.  Please call us at [phone] if you have not heard about the biopsies in 3 weeks.    SIGNATURES/CONFIDENTIALITY: You and/or your care  partner have signed paperwork which will be entered into your electronic medical record.  These signatures attest to the fact that that the information above on your After Visit Summary has been reviewed and is understood.  Full responsibility of the confidentiality of this discharge information lies with you and/or your care-partner.

## 2019-10-13 NOTE — Progress Notes (Signed)
Report to PACU, RN, vss, BBS= Clear.  

## 2019-10-13 NOTE — Op Note (Addendum)
[facility] Patient Name: Cristina Conner Procedure Date: 10/13/2019 10:07 AM MRN: [id number] Endoscopist: Gatha Mayer , MD Age: 59 Referring MD:  Date of Birth: 31-May-1961 Gender: Female Account #: [id number] Procedure:                Upper GI endoscopy Indications:              Epigastric abdominal pain, Nausea with vomiting Medicines:                Propofol per Anesthesia, Monitored Anesthesia Care Procedure:                Pre-Anesthesia Assessment:                           - Prior to the procedure, a History and Physical                            was performed, and patient medications and                            allergies were reviewed. The patient's tolerance of                            previous anesthesia was also reviewed. The risks                            and benefits of the procedure and the sedation                            options and risks were discussed with the patient.                            All questions were answered, and informed consent                            was obtained. Prior Anticoagulants: The patient has                            taken no previous anticoagulant or antiplatelet                            agents. ASA Grade Assessment: II - A patient with                            mild systemic disease. After reviewing the risks                            and benefits, the patient was deemed in                            satisfactory condition to undergo the procedure.                           After obtaining informed consent, the endoscope was  passed under direct vision. Throughout the                            procedure, the patient's blood pressure, pulse, and                            oxygen saturations were monitored continuously. The                            Endoscope was introduced through the mouth, and                            advanced to the second part of duodenum. The upper                    GI endoscopy was accomplished without difficulty.                            The patient tolerated the procedure well. Scope In: Scope Out: Findings:                 The examined esophagus was normal.                           The gastroesophageal flap valve was visualized                            endoscopically and classified as Hill Grade II                            (fold present, opens with respiration).                           The entire examined stomach was normal.                           The examined duodenum was normal.                           The cardia and gastric fundus were normal on                            retroflexion. Complications:            No immediate complications. Estimated Blood Loss:     Estimated blood loss: none. Impression:               - Normal esophagus.                           - Gastroesophageal flap valve classified as Hill                            Grade II (fold present, opens with respiration).                           - Normal stomach.                           -  Normal examined duodenum.                           - No specimens collected. Recommendation:           - Patient has a contact number available for                            emergencies. The signs and symptoms of potential                            delayed complications were discussed with the                            patient. Return to normal activities tomorrow.                            Written discharge instructions were provided to the                            patient.                           - Resume previous diet.                           - Continue present medications.                           - STOP OMEPRAZOLE AT END OF FEBRUARY                           IF SYMPTOMS RETURN WOULD DO HIDA SCAN + EJECTION                            FRACTION                           SHE RELEATED IN RECOVERY THAT SHE HAS BEEN UNDER A                             GREAT DEAL OF STRESS SETTLING ESTATE OF HUSBAND                            (DIED IN 03-30-2019) AND ASKED IFTHAT COULD BE REL;ATED                            - THINK SO Gatha Mayer, MD 10/13/2019 10:29:13 AM This report has been signed electronically.

## 2019-10-13 NOTE — Progress Notes (Signed)
Pt. Reports no change in her medical or surgical history since her pre-visit 10/05/19.

## 2019-10-15 ENCOUNTER — Telehealth: Payer: Self-pay | Admitting: *Deleted

## 2019-10-15 ENCOUNTER — Telehealth: Payer: Self-pay

## 2019-10-15 NOTE — Telephone Encounter (Signed)
  Follow up Call-  Call back number 10/13/2019  Post procedure Call Back phone  # [phone]  Permission to leave phone message Yes  Some recent data might be hidden     Patient questions:  Do you have a fever, pain , or abdominal swelling? No. Pain Score  0 *  Have you tolerated food without any problems? Yes.    Have you been able to return to your normal activities? Yes.    Do you have any questions about your discharge instructions: Diet   No. Medications  No. Follow up visit  No.  Do you have questions or concerns about your Care? No.  Actions: * If pain score is 4 or above: No action needed, pain <4.  1. Have you developed a fever since your procedure? no  2.   Have you had an respiratory symptoms (SOB or cough) since your procedure? no  3.   Have you tested positive for COVID 19 since your procedure no  4.   Have you had any family members/close contacts diagnosed with the COVID 19 since your procedure? no   If yes to any of these questions please route to Joylene John, RN and Alphonsa Gin, Therapist, sports.

## 2019-10-15 NOTE — Telephone Encounter (Signed)
Left message on follow up call. 

## 2019-10-19 LAB — HM MAMMOGRAPHY

## 2019-10-25 ENCOUNTER — Encounter: Payer: Self-pay | Admitting: Anesthesiology

## 2019-10-26 ENCOUNTER — Encounter: Payer: Self-pay | Admitting: Obstetrics & Gynecology

## 2019-11-02 NOTE — Progress Notes (Signed)
Opened in error. T. Tersea Aulds, CMA 

## 2019-11-09 ENCOUNTER — Telehealth: Payer: Self-pay

## 2019-11-09 NOTE — Telephone Encounter (Signed)
Patient's office visit changed to Dr Carlean Purl on 11/10/19. Called patient and notified her.

## 2019-11-10 ENCOUNTER — Ambulatory Visit: Payer: BC Managed Care – PPO | Admitting: Internal Medicine

## 2019-11-10 ENCOUNTER — Other Ambulatory Visit (INDEPENDENT_AMBULATORY_CARE_PROVIDER_SITE_OTHER): Payer: BC Managed Care – PPO

## 2019-11-10 ENCOUNTER — Other Ambulatory Visit: Payer: Self-pay

## 2019-11-10 ENCOUNTER — Encounter: Payer: Self-pay | Admitting: Internal Medicine

## 2019-11-10 VITALS — BP 138/80 | HR 106 | Temp 98.7°F | Ht 66.0 in | Wt 134.0 lb

## 2019-11-10 DIAGNOSIS — R1013 Epigastric pain: Secondary | ICD-10-CM | POA: Diagnosis not present

## 2019-11-10 DIAGNOSIS — R112 Nausea with vomiting, unspecified: Secondary | ICD-10-CM

## 2019-11-10 DIAGNOSIS — R634 Abnormal weight loss: Secondary | ICD-10-CM

## 2019-11-10 DIAGNOSIS — F4381 Prolonged grief disorder: Secondary | ICD-10-CM

## 2019-11-10 DIAGNOSIS — R10816 Epigastric abdominal tenderness: Secondary | ICD-10-CM | POA: Diagnosis not present

## 2019-11-10 DIAGNOSIS — F4329 Adjustment disorder with other symptoms: Secondary | ICD-10-CM

## 2019-11-10 LAB — CBC WITH DIFFERENTIAL/PLATELET
Basophils Absolute: 0 10*3/uL (ref 0.0–0.1)
Basophils Relative: 0.8 % (ref 0.0–3.0)
Eosinophils Absolute: 0.1 10*3/uL (ref 0.0–0.7)
Eosinophils Relative: 0.9 % (ref 0.0–5.0)
HCT: 35.7 % — ABNORMAL LOW (ref 36.0–46.0)
Hemoglobin: 11.3 g/dL — ABNORMAL LOW (ref 12.0–15.0)
Lymphocytes Relative: 19.9 % (ref 12.0–46.0)
Lymphs Abs: 1.1 10*3/uL (ref 0.7–4.0)
MCHC: 31.7 g/dL (ref 30.0–36.0)
MCV: 74.5 fl — ABNORMAL LOW (ref 78.0–100.0)
Monocytes Absolute: 0.4 10*3/uL (ref 0.1–1.0)
Monocytes Relative: 7.1 % (ref 3.0–12.0)
Neutro Abs: 4.1 10*3/uL (ref 1.4–7.7)
Neutrophils Relative %: 71.3 % (ref 43.0–77.0)
Platelets: 389 10*3/uL (ref 150.0–400.0)
RBC: 4.79 Mil/uL (ref 3.87–5.11)
RDW: 16 % — ABNORMAL HIGH (ref 11.5–15.5)
WBC: 5.8 10*3/uL (ref 4.0–10.5)

## 2019-11-10 LAB — COMPREHENSIVE METABOLIC PANEL
ALT: 9 U/L (ref 0–35)
AST: 12 U/L (ref 0–37)
Albumin: 4.5 g/dL (ref 3.5–5.2)
Alkaline Phosphatase: 68 U/L (ref 39–117)
BUN: 12 mg/dL (ref 6–23)
CO2: 26 mEq/L (ref 19–32)
Calcium: 10 mg/dL (ref 8.4–10.5)
Chloride: 103 mEq/L (ref 96–112)
Creatinine, Ser: 0.96 mg/dL (ref 0.40–1.20)
GFR: 72.37 mL/min (ref >60.00)
Glucose, Bld: 116 mg/dL — ABNORMAL HIGH (ref 70–99)
Potassium: 3.5 mEq/L (ref 3.5–5.1)
Sodium: 136 mEq/L (ref 135–145)
Total Bilirubin: 0.5 mg/dL (ref 0.2–1.2)
Total Protein: 7.5 g/dL (ref 6.0–8.3)

## 2019-11-10 LAB — LIPASE: Lipase: 41 U/L (ref 11.0–59.0)

## 2019-11-10 MED ORDER — ONDANSETRON HCL 4 MG PO TABS: 4.0000 mg | ORAL_TABLET | Freq: Four times a day (QID) | ORAL | 1 refills | Status: DC | PRN

## 2019-11-10 MED ORDER — OMEPRAZOLE 20 MG PO CPDR: 20.0000 mg | DELAYED_RELEASE_CAPSULE | Freq: Every day | ORAL | 1 refills | Status: DC

## 2019-11-10 NOTE — Progress Notes (Signed)
Cristina Conner 59 y.o. 1961/09/02 [id number]  Assessment & Plan:   Encounter Diagnoses  Name Primary?  . Abdominal pain, epigastric Yes  . Loss of weight   . Epigastric abdominal tenderness without rebound tenderness   . Intractable nausea and vomiting   . Prolonged grief reaction     My sense is this is most likely related to her grief and situational stress and probable anxiety and depression.  I am going to go ahead and have her do a CT of the abdomen and pelvis and repeat labs.  I think regardless she should get back with Dr. Raliegh Conner and reconsider pharmacologic treatment.  She is in counseling.  We will definitely follow-up on any CT abnormalities.  I am less inclined to think this is a gallbladder issue the way it has evolved so I am not inclined to pursue a HIDA scan.  Orders Placed This Encounter  Procedures  . CT Abdomen Pelvis W Contrast  . CBC with Differential/Platelet  . Comprehensive metabolic panel  . Lipase    Meds ordered this encounter  Medications  . ondansetron (ZOFRAN) 4 MG tablet    Sig: Take 1 tablet (4 mg total) by mouth every 6 (six) hours as needed for nausea or vomiting.    Dispense:  30 tablet    Refill:  1  . omeprazole (PRILOSEC) 20 MG capsule    Sig: Take 1 capsule (20 mg total) by mouth daily before breakfast.    Dispense:  30 capsule    Refill:  1   She is advised to try to frequently/constantly sip liquids like Pedialyte etc. to stay hydrated.  I appreciate the opportunity to care for this patient. CC: Cristina Dance, DO  Subjective:   Chief Complaint: Nausea and vomiting weight loss epigastric pain  HPI Cristina Conner presents in follow-up, she had seen Cristina Conner in early part of 2021 because of nausea vomiting epigastric pain ultrasound was unrevealing EGD was unrevealing.  She got a bit better but because she had persistent issues she called back as instructed.  She is describing a postprandial epigastric pain often, fairly  intense, it lasts for hours at times, and she vomits usually hour sometimes days after she eats.  She had let me know she was under considerable stress when I had done her endoscopy.  That stresses that her husband died in the summer 2020.  She was on fluoxetine and I think has had some alprazolam prescriptions but "I do not really want to take medication".  She is seeing a Social worker.  She reports she is sleeping "a lot but I still remain very tired" She thinks Zofran helps some as well.  "I do not move my bowels right" chronic 3 bowel movements a week not changed.  Nausea is constant the vomiting and epigastric pain are intermittent.  She is avoiding eating because of the way she feels and eating differently trying to keep electrolytes Pedialyte etc. in.  Wt Readings from Last 3 Encounters:  11/10/19 134 lb (60.8 kg)  10/13/19 140 lb 6.4 oz (63.7 kg)  10/05/19 140 lb 6.4 oz (63.7 kg)    Allergies  Allergen Reactions  . Cristina Conner Nausea And Vomiting   Current Meds  Medication Sig  . eszopiclone (LUNESTA) 1 MG TABS tablet 1-2 tablets as needed for sleep before bedtime  . famotidine (PEPCID) 20 MG tablet Take 20 mg by mouth 2 (two) times daily.  . hyoscyamine (LEVSIN SL) 0.125 MG SL tablet Place 1 tablet (  0.125 mg total) under the tongue every 6 (six) hours as needed.  Marland Kitchen lisinopril-hydrochlorothiazide (ZESTORETIC) 20-12.5 MG tablet TAKE 1 TABLET BY MOUTH EVERY DAY  . omeprazole (PRILOSEC) 20 MG capsule Take 1 capsule (20 mg total) by mouth daily before breakfast.  . ondansetron (ZOFRAN) 4 MG tablet Take 1 tablet (4 mg total) by mouth every 6 (six) hours as needed for nausea or vomiting.  . ondansetron (ZOFRAN-ODT) 8 MG disintegrating tablet Take 1 tablet (8 mg total) by mouth every 8 (eight) hours as needed for nausea or refractory nausea / vomiting.  . [DISCONTINUED] omeprazole (PRILOSEC) 20 MG capsule Take 20 mg by mouth daily.  . [DISCONTINUED] ondansetron (ZOFRAN) 4 MG tablet Take 1 tablet  (4 mg total) by mouth every 6 (six) hours as needed for nausea or vomiting.   Past Medical History:  Diagnosis Date  . Anxiety   . Arthritis   . Hypertension   . Osteopenia   . Post-operative nausea and vomiting    vomiting after general anesthesia per pt.  . Pre-diabetes    Past Surgical History:  Procedure Laterality Date  . CESAREAN SECTION     x 2  . ENDOMETRIAL ABLATION    . ESOPHAGOGASTRODUODENOSCOPY  10/2019  . PELVIC LAPAROSCOPY  1999   left salpingectomy, excision of Right, paratubal cyst  . PELVIC LAPAROSCOPY     laparoscopy with lysis of pelvic adhesions  . SHOULDER SURGERY  11/2010   "FROZEN SHOULDER"  . TOTAL HIP ARTHROPLASTY Left 08/17/2019   Procedure: LEFT TOTAL HIP ARTHROPLASTY ANTERIOR APPROACH;  Surgeon: Melrose Nakayama, MD;  Location: [facility];  Service: Orthopedics;  Laterality: Left;  . TUBAL LIGATION     Social History   Social History Narrative   Widowed in summer 2020   Former smoker no alcohol or drug use   family history includes Diabetes in her mother; Heart disease in her mother; Hypertension in her father and mother.   Review of Systems As per HPI  Objective:   Physical Exam BP 138/80   Pulse (!) 106   Temp 98.7 F (37.1 C)   Ht 5\' 6"  (1.676 m)   Wt 134 lb (60.8 kg)   LMP 05/01/2009   BMI 21.63 kg/m  Lungs cta Cor Nl abd mildly tender epigastrium, no bruits, BS + anxious

## 2019-11-10 NOTE — Patient Instructions (Signed)
Your provider has requested that you go to the basement level for lab work before leaving today. Press "B" on the elevator. The lab is located at the first door on the left as you exit the elevator.   We have sent the following medications to your pharmacy for you to pick up at your convenience: zofran   You have been scheduled for a CT scan of the abdomen and pelvis at [facility].  You are scheduled on 11/16/2019 at 8:30AM. You should arrive 20 minutes prior to your appointment time for registration. Please follow the written instructions below on the day of your exam:  WARNING: IF YOU ARE ALLERGIC TO IODINE/X-RAY DYE, PLEASE NOTIFY RADIOLOGY IMMEDIATELY AT [phone]! YOU WILL BE GIVEN A 13 HOUR PREMEDICATION PREP.  1) Do not eat anything after 4:30AM (4 hours prior to your test) 2) You have been given 2 bottles of oral contrast to drink. The solution may taste better if refrigerated, but do NOT add ice or any other liquid to this solution. Shake well before drinking.    Drink 1 bottle of contrast @ 6:30AM (2 hours prior to your exam)  Drink 1 bottle of contrast @ 7:30AM (1 hour prior to your exam)  You may take any medications as prescribed with a small amount of water, if necessary. If you take any of the following medications: METFORMIN, GLUCOPHAGE, GLUCOVANCE, AVANDAMET, RIOMET, FORTAMET, [place] MET, JANUMET, GLUMETZA or METAGLIP, you MAY be asked to HOLD this medication 48 hours AFTER the exam.  The purpose of you drinking the oral contrast is to aid in the visualization of your intestinal tract. The contrast solution may cause some diarrhea. Depending on your individual set of symptoms, you may also receive an intravenous injection of x-ray contrast/dye. Plan on being at [facility]  for 30 minutes or longer, depending on the type of exam you are having performed.  This test typically takes 30-45 minutes to complete.  If you have any questions regarding your exam or  if you need to reschedule, you may call the CT department at [phone] between the hours of 8:00 am and 5:00 pm, Monday-Friday.   Please follow up with your primary care Doctor.   ________________________________________________________________________  I appreciate the opportunity to care for you. Silvano Rusk, MD, [facility]

## 2019-11-11 ENCOUNTER — Encounter: Payer: Self-pay | Admitting: Family Medicine

## 2019-11-12 ENCOUNTER — Ambulatory Visit: Payer: BC Managed Care – PPO | Admitting: Physician Assistant

## 2019-11-14 ENCOUNTER — Other Ambulatory Visit: Payer: Self-pay | Admitting: Family Medicine

## 2019-11-16 ENCOUNTER — Inpatient Hospital Stay: Admission: RE | Admit: 2019-11-16 | Payer: BC Managed Care – PPO | Source: Ambulatory Visit

## 2019-11-16 ENCOUNTER — Telehealth: Payer: Self-pay

## 2019-11-16 DIAGNOSIS — R1013 Epigastric pain: Secondary | ICD-10-CM

## 2019-11-16 DIAGNOSIS — R10816 Epigastric abdominal tenderness: Secondary | ICD-10-CM

## 2019-11-16 DIAGNOSIS — R634 Abnormal weight loss: Secondary | ICD-10-CM

## 2019-11-16 NOTE — Telephone Encounter (Signed)
I have spoken to [facility] centralized scheduling and they request that I fax the order to them at [phone] and they will call her and set it up. Order faxed and confirmation that it went thru.

## 2019-11-16 NOTE — Telephone Encounter (Signed)
-----   Message from Darden Dates sent at 11/16/2019  8:41 AM EDT ----- Regarding: need imaging appt Hey PJ, Long story, but patient's CT needs to be scheduled over at [facility] [phone] due to insurance network issues.   Patient is aware we are working on changing to that place of service. I have already obtained the auth with their facility. Auth# [id number] good 11/16/19-05/13/20 Can you please call to schedule, give them that auth info, then call patient with appt time or let me know when it is and I'll call her. Thanks a million! Amy

## 2019-11-17 ENCOUNTER — Encounter (HOSPITAL_COMMUNITY): Payer: Self-pay | Admitting: Emergency Medicine

## 2019-11-17 ENCOUNTER — Other Ambulatory Visit: Payer: Self-pay

## 2019-11-17 ENCOUNTER — Telehealth: Payer: Self-pay | Admitting: Family Medicine

## 2019-11-17 ENCOUNTER — Telehealth: Payer: Self-pay | Admitting: Internal Medicine

## 2019-11-17 ENCOUNTER — Emergency Department (HOSPITAL_COMMUNITY)
Admission: EM | Admit: 2019-11-17 | Discharge: 2019-11-17 | Disposition: A | Discharge status: Home / Self Care | Payer: BC Managed Care – PPO | Attending: Emergency Medicine | Admitting: Emergency Medicine

## 2019-11-17 ENCOUNTER — Emergency Department (HOSPITAL_COMMUNITY): Payer: BC Managed Care – PPO

## 2019-11-17 DIAGNOSIS — Z96642 Presence of left artificial hip joint: Secondary | ICD-10-CM | POA: Insufficient documentation

## 2019-11-17 DIAGNOSIS — I10 Essential (primary) hypertension: Secondary | ICD-10-CM | POA: Insufficient documentation

## 2019-11-17 DIAGNOSIS — R1013 Epigastric pain: Secondary | ICD-10-CM | POA: Diagnosis not present

## 2019-11-17 DIAGNOSIS — Z79899 Other long term (current) drug therapy: Secondary | ICD-10-CM | POA: Diagnosis not present

## 2019-11-17 DIAGNOSIS — R1084 Generalized abdominal pain: Secondary | ICD-10-CM

## 2019-11-17 DIAGNOSIS — K529 Noninfective gastroenteritis and colitis, unspecified: Secondary | ICD-10-CM

## 2019-11-17 DIAGNOSIS — R112 Nausea with vomiting, unspecified: Secondary | ICD-10-CM | POA: Insufficient documentation

## 2019-11-17 DIAGNOSIS — R1032 Left lower quadrant pain: Secondary | ICD-10-CM | POA: Diagnosis not present

## 2019-11-17 LAB — COMPREHENSIVE METABOLIC PANEL
ALT: 14 U/L (ref 0–44)
AST: 17 U/L (ref 15–41)
Albumin: 4.6 g/dL (ref 3.5–5.0)
Alkaline Phosphatase: 67 U/L (ref 38–126)
Anion gap: 10 (ref 5–15)
BUN: 14 mg/dL (ref 6–20)
CO2: 24 mmol/L (ref 22–32)
Calcium: 9.6 mg/dL (ref 8.9–10.3)
Chloride: 107 mmol/L (ref 98–111)
Creatinine, Ser: 0.95 mg/dL (ref 0.44–1.00)
GFR calc Af Amer: >60 mL/min (ref >60)
GFR calc non Af Amer: >60 mL/min (ref >60)
Glucose, Bld: 103 mg/dL — ABNORMAL HIGH (ref 70–99)
Potassium: 3.6 mmol/L (ref 3.5–5.1)
Sodium: 141 mmol/L (ref 135–145)
Total Bilirubin: 1.1 mg/dL (ref 0.3–1.2)
Total Protein: 7.8 g/dL (ref 6.5–8.1)

## 2019-11-17 LAB — CBC WITH DIFFERENTIAL/PLATELET
Abs Immature Granulocytes: 0.02 10*3/uL (ref 0.00–0.07)
Basophils Absolute: 0.1 10*3/uL (ref 0.0–0.1)
Basophils Relative: 1 %
Eosinophils Absolute: 0 10*3/uL (ref 0.0–0.5)
Eosinophils Relative: 0 %
HCT: 41.3 % (ref 36.0–46.0)
Hemoglobin: 12.3 g/dL (ref 12.0–15.0)
Immature Granulocytes: 0 %
Lymphocytes Relative: 15 %
Lymphs Abs: 1.5 10*3/uL (ref 0.7–4.0)
MCH: 23.5 pg — ABNORMAL LOW (ref 26.0–34.0)
MCHC: 29.8 g/dL — ABNORMAL LOW (ref 30.0–36.0)
MCV: 78.8 fL — ABNORMAL LOW (ref 80.0–100.0)
Monocytes Absolute: 0.5 10*3/uL (ref 0.1–1.0)
Monocytes Relative: 5 %
Neutro Abs: 8 10*3/uL — ABNORMAL HIGH (ref 1.7–7.7)
Neutrophils Relative %: 79 %
Platelets: 344 10*3/uL (ref 150–400)
RBC: 5.24 MIL/uL — ABNORMAL HIGH (ref 3.87–5.11)
RDW: 15.7 % — ABNORMAL HIGH (ref 11.5–15.5)
WBC: 10.1 10*3/uL (ref 4.0–10.5)
nRBC: 0 % (ref 0.0–0.2)

## 2019-11-17 MED ORDER — METRONIDAZOLE 500 MG PO TABS: 500.0000 mg | ORAL_TABLET | Freq: Two times a day (BID) | ORAL | 0 refills | Status: DC

## 2019-11-17 MED ORDER — IOHEXOL 300 MG/ML  SOLN
100.0000 mL | Freq: Once | INTRAMUSCULAR | Status: AC | PRN
  Administered 2019-11-17: 100 mL via INTRAVENOUS

## 2019-11-17 MED ORDER — CIPROFLOXACIN HCL 500 MG PO TABS: 500.0000 mg | ORAL_TABLET | Freq: Two times a day (BID) | ORAL | 0 refills | Status: DC

## 2019-11-17 MED ORDER — SODIUM CHLORIDE 0.9 % IV BOLUS
500.0000 mL | Freq: Once | INTRAVENOUS | Status: AC
  Administered 2019-11-17: 500 mL via INTRAVENOUS

## 2019-11-17 NOTE — ED Provider Notes (Addendum)
[facility] DEPT Provider Note   CSN: [id number] Arrival date & time: 11/17/19  1112     History Chief Complaint  Patient presents with  . Abdominal Pain  . Emesis    Cristina Conner is a 59 y.o. female.  HPI     59 year old female comes in a chief complaint of abdominal pain and vomiting. Patient has history of hypertension and reports abdominal pain that has been present now for the last 2 or 3 months.  The abdominal pain is typically epigastric, and she normally gets pain on 5 out of 7 days of the week.  The pain when present will last for several hours.  She thinks that the pain is typically either provoked by food or made worse by food.  She has seen GI and had an upper endoscopy and an ultrasound which were both negative for any acute findings.  She was noted to have GERD and she is taking medications for it.  Patient's pain is primarily in the upper quadrants but intermittently she has noted the pain to be in the lower quadrants as well.  She has nausea but no vomiting.  No diarrhea.  There is no family history of IBD, IBS.  Patient has reduced p.o. intake.  Past Medical History:  Diagnosis Date  . Anxiety   . Arthritis   . Hypertension   . Osteopenia   . Post-operative nausea and vomiting    vomiting after general anesthesia per pt.  . Pre-diabetes     Patient Active Problem List   Diagnosis Date Noted  . Status post left hip replacement 09/07/2019  . 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 Primary localized osteoarthritis of left hip 2019/08/29  . Death of family member-  husband passed 04/29/2019  . 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 Sleep difficulties 09/09/2018  . Pre-diabetes 03/09/2018  . Postmenopausal state- went thru early 40's 01/02/2018  . Primary osteoarthritis of left hip 01/02/2018  . Caregiver burden- for husband with ALLeukemia 01/02/2018  . Left hip pain 01/02/2018  . Osteopenia 07/31/2016  . Hypertension 05/20/2011  .  Menopausal state 05/20/2011    Past Surgical History:  Procedure Laterality Date  . CESAREAN SECTION     x 2  . ENDOMETRIAL ABLATION    . ESOPHAGOGASTRODUODENOSCOPY  10/2019  . PELVIC LAPAROSCOPY  1999   left salpingectomy, excision of Right, paratubal cyst  . PELVIC LAPAROSCOPY     laparoscopy with lysis of pelvic adhesions  . SHOULDER SURGERY  11/2010   "FROZEN SHOULDER"  . TOTAL HIP ARTHROPLASTY Left Aug 29, 2019   Procedure: LEFT TOTAL HIP ARTHROPLASTY ANTERIOR APPROACH;  Surgeon: Melrose Nakayama, MD;  Location: [facility];  Service: Orthopedics;  Laterality: Left;  . TUBAL LIGATION       OB History    Gravida  2   Para  2   Term      Preterm      AB      Living  2     SAB      TAB      Ectopic      Multiple      Live Births              Family History  Problem Relation Age of Onset  . Hypertension Mother   . Heart disease Mother   . Diabetes Mother   . Hypertension Father   . Colon cancer Neg Hx   . Esophageal cancer Neg Hx   . Stomach  cancer Neg Hx   . Rectal cancer Neg Hx     Social History   Tobacco Use  . Smoking status: Never Smoker  . Smokeless tobacco: Never Used  Substance Use Topics  . Alcohol use: Not Currently  . Drug use: No    Home Medications Prior to Admission medications   Medication Sig Start Date End Date Taking? Authorizing Provider  eszopiclone (LUNESTA) 1 MG TABS tablet 1-2 tablets as needed for sleep before bedtime 10/15/19   [provider]  famotidine (PEPCID) 20 MG tablet Take 20 mg by mouth 2 (two) times daily.    [provider]  hyoscyamine (LEVSIN SL) 0.125 MG SL tablet Place 1 tablet (0.125 mg total) under the tongue every 6 (six) hours as needed. 09/29/19   Mauri Pole, MD  lisinopril-hydrochlorothiazide (ZESTORETIC) 20-12.5 MG tablet TAKE 1 TABLET BY MOUTH EVERY DAY 11/15/19   Opalski, Neoma Laming, DO  omeprazole (PRILOSEC) 20 MG capsule Take 1 capsule (20 mg total) by mouth daily before  breakfast. 11/10/19   Gatha Mayer, MD  ondansetron (ZOFRAN) 4 MG tablet Take 1 tablet (4 mg total) by mouth every 6 (six) hours as needed for nausea or vomiting. 11/10/19   Gatha Mayer, MD  ondansetron (ZOFRAN-ODT) 8 MG disintegrating tablet Take 1 tablet (8 mg total) by mouth every 8 (eight) hours as needed for nausea or refractory nausea / vomiting. 09/20/19   Mellody Dance, DO    Allergies    Hydrocodone  Review of Systems   Review of Systems  Constitutional: Positive for activity change.  Respiratory: Negative for shortness of breath.   Cardiovascular: Negative for chest pain.  Gastrointestinal: Positive for abdominal pain and nausea.  All other systems reviewed and are negative.   Physical Exam Updated Vital Signs BP (!) 150/97 (BP Location: Left Arm)   Pulse (!) 118   Temp 98.1 F (36.7 C) (Oral)   Resp 18   LMP 05/01/2009   SpO2 100%   Physical Exam Vitals and nursing note reviewed.  Constitutional:      Appearance: She is well-developed.  HENT:     Head: Normocephalic and atraumatic.  Cardiovascular:     Rate and Rhythm: Tachycardia present.  Pulmonary:     Effort: Pulmonary effort is normal.  Abdominal:     General: Bowel sounds are normal.     Tenderness: There is abdominal tenderness in the epigastric area and left lower quadrant.     Hernia: No hernia is present.  Musculoskeletal:     Cervical back: Normal range of motion and neck supple.  Skin:    General: Skin is warm and dry.  Neurological:     Mental Status: She is alert and oriented to person, place, and time.     ED Results / Procedures / Treatments   Labs (all labs ordered are listed, but only abnormal results are displayed) Labs Reviewed  COMPREHENSIVE METABOLIC PANEL - Abnormal; Notable for the following components:      Result Value   Glucose, Bld 103 (*)    All other components within normal limits  CBC WITH DIFFERENTIAL/PLATELET - Abnormal; Notable for the following components:    RBC 5.24 (*)    MCV 78.8 (*)    MCH 23.5 (*)    MCHC 29.8 (*)    RDW 15.7 (*)    Neutro Abs 8.0 (*)    All other components within normal limits    EKG None  Radiology No results found.  Procedures Procedures (including critical care time)  Medications Ordered in ED Medications  sodium chloride 0.9 % bolus 500 mL (500 mLs Intravenous New Bag/Given 11/17/19 1421)    ED Course  I have reviewed the triage vital signs and the nursing notes.  Pertinent labs & imaging results that were available during my care of the patient were reviewed by me and considered in my medical decision making (see chart for details).    MDM Rules/Calculators/A&P                      59 year old female comes in a chief complaint of abdominal pain. Patient is having generalized abdominal tenderness, mostly in the upper quadrants for the last several days.  She has intermittent, waxing and waning intensity to the pain.  She had an EGD which is negative.  Ultrasound which was also negative.  PCP and GI do not think that the underlying cause is hepatobiliary.  It appears that they were interested in getting a CT scan.  Patient on exam has no acute findings besides the tenderness over the upper quadrants and left lower quadrant.  CT scan ordered.  If negative we will advise her to continue with PCP follow-up, as the differential would include conditions like IBS, celiac's disease etc.  Final Clinical Impression(s) / ED Diagnoses Final diagnoses:  Generalized abdominal pain  Non-intractable vomiting with nausea, unspecified vomiting type    Rx / DC Orders ED Discharge Orders    None          Varney Biles, MD 11/17/19 [phone]

## 2019-11-17 NOTE — ED Provider Notes (Signed)
MSE was initiated and I personally evaluated the patient and placed orders (if any) at  5:43 PM on November 17, 2019.  Patient appears comfortable and has had no further vomiting.  CT shows proximal and mid small bowel inflammation and wall thickening consistent with enteritis.  Spoke with Dr. Arelia Longest who recommended starting Cipro and Flagyl for the next 5 days and the office will follow up for more evaluation.  Patient will continue her outpatient medications and was given return precautions.   Blanchie Dessert, MD 11/17/19 1744

## 2019-11-17 NOTE — ED Triage Notes (Signed)
Patient reports intermittent stabbing abdominal pains with vomiting x3 months. Reports followed by gastroenterology for same. States vomiting started yesterday. Denies diarrhea.

## 2019-11-17 NOTE — Telephone Encounter (Signed)
Spoke to Dr. Maryan Rued - Cristina Conner went to ED today  CT abd/pelvis shows inflamed small bowel loops  Will cover w/ Abx   As I look at her med list she is on lisinopril w/ HCTZ and I must consider possibilitry of ACEI induced angioedema as a cause of this  We will call her tomorrow and ask:  1) How is she feeling compared to 3/17  2) How long has she been on lisinopril/HCTZ?  3) Has she evere experienced lip or tongue swelling problems?  Will determine f/u after this

## 2019-11-17 NOTE — Telephone Encounter (Signed)
Patient called our office stating that she has been vomiting and having nausea x 2 days. Patient states she saw GI on 11/10/19 and that he is attributing the nausea/vomiting to grief and anxiety. She states he advised her to call us for further treatment. Patient is currently in counseling. Patient states that she is having sharp pain in her stomach.   I spoke with Dr. Raliegh Scarlet who says we can start patient on Lexapro 10mg  1/2 tab x8 days then full tab daily with meal.   I called patient to advise this and patient states she is in ED for evaluation. I advised patient we would wait on starting the new medication and for her to allow ED to treat and call if she needs anything else. Patient verbalized understanding. AS, CMA

## 2019-11-17 NOTE — Discharge Instructions (Addendum)
We saw you in the ER for the abdominal pain. All the results in the ER are normal, labs and imaging. We are not sure what is causing your symptoms. The workup in the ER is not complete, and is limited to screening for life threatening and emergent conditions only, so please see a primary care doctor for further evaluation.  CAT scan today showed that you had inflammation of your small bowel consistent with enteritis.  Now it will be important to work with the specialist to find out what is causing this.  We did prescribe you antibiotics today in case it is bacterial.  The office will call you for further work-up.

## 2019-11-18 NOTE — Telephone Encounter (Signed)
OK  Explain to her that we see inflammation in the small bowel but cannot tell where it came from.  She could have had an infection on top of the chronic issues or this demonstrates a chronic issue that we did not know  Please schedule a f/u w/ Amy E 3/26 9she has seen her before) at which point we will decide next steps as far as testing go.  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  A rare but real cause of problems like this.  We may need to stop that medication - I am going to cc PCP Dr. Raliegh Scarlet on this to see if she thinks we can switch BP med now

## 2019-11-18 NOTE — Telephone Encounter (Signed)
Answers to questions as follows:  1. "I'm feeling a little bit better. My stomach is sore, but I think that's from all of the vomiting yesterday."  2. "I'm not sure but I'd guess about 4 years."  3. "No."

## 2019-11-18 NOTE — Telephone Encounter (Signed)
Patient notified of results and recommendations, scheduled with AE on 3/29 at 9:00 am. Told her PCP would be reaching out concerning a possible change with BP medication.

## 2019-11-19 NOTE — Telephone Encounter (Signed)
Patient is aware of the below. AS, CMA 

## 2019-11-19 NOTE — Telephone Encounter (Signed)
Cristina Conner, can you let patient know that per her GI doctor, they recommend a change in her blood pressure medicine from lisinopril to something else.  In addition, due to her increased stress, I think it is important we meet to discuss a new mood medicine as well as a change to her blood pressure medicine.  Please have her make an appointment our next available.  Thank you.  If she is comfortable please make this in person

## 2019-11-25 ENCOUNTER — Other Ambulatory Visit: Payer: Self-pay

## 2019-11-26 ENCOUNTER — Encounter: Payer: Self-pay | Admitting: Obstetrics & Gynecology

## 2019-11-26 ENCOUNTER — Ambulatory Visit (INDEPENDENT_AMBULATORY_CARE_PROVIDER_SITE_OTHER): Payer: BC Managed Care – PPO | Admitting: Obstetrics & Gynecology

## 2019-11-26 VITALS — BP 140/88 | Ht 65.0 in | Wt 134.0 lb

## 2019-11-26 DIAGNOSIS — Z780 Asymptomatic menopausal state: Secondary | ICD-10-CM | POA: Diagnosis not present

## 2019-11-26 DIAGNOSIS — M8589 Other specified disorders of bone density and structure, multiple sites: Secondary | ICD-10-CM

## 2019-11-26 DIAGNOSIS — Z01419 Encounter for gynecological examination (general) (routine) without abnormal findings: Secondary | ICD-10-CM

## 2019-11-26 NOTE — Patient Instructions (Signed)
1. Well female exam with routine gynecological exam Normal gynecologic exam in menopause.  Pap test November 2019 was negative, will repeat Pap test next year.  Breast exam normal.  Screening mammogram February 2021 was negative.  Colonoscopy 2014.  Health labs with family physician.  Body mass index 22.3.  Will you resume physical activities as she has recovered from left hip replacement.  Still under investigation by GI for nausea and vomiting persisting postop.  2. Postmenopausal Well on no hormone replacement therapy.  No postmenopausal bleeding.  3. Osteopenia of multiple sites Osteopenia on bone density in 2017.  Will repeat a bone density here now.  Vitamin D supplements, calcium intake of 1200 mg daily and regular weightbearing physical activity is recommended. - DG Bone Density; Future  Cristina Conner, it was a pleasure seeing you today!

## 2019-11-26 NOTE — Progress Notes (Signed)
Cristina Conner 08/28/1961 [id number]   History:    59 y.o. G2P2L2  Married.  Widowed x 03/2019.  RP:  Established patient presenting for annual gyn exam   HPI: Postmenopausal, well on no HRT.  No PMB.  No pelvic pain.  Abstinence.  Urine/BMs wnl.  Breasts wnl.  BMI 22.3. Lt Hip Replacement in 08/2019.  GI for N/V persisting postop.  Healthy nutrition.  Health labs with Fam MD.  Colono normal 2014.   Past medical history,surgical history, family history and social history were all reviewed and documented in the EPIC chart.  Gynecologic History Patient's last menstrual period was 05/01/2009.  Obstetric History OB History  Gravida Para Term Preterm AB Living  2 2       2   SAB TAB Ectopic Multiple Live Births               # Outcome Date GA Lbr Len/2nd Weight Sex Delivery Anes PTL Lv  2 Para           1 Para              ROS: A ROS was performed and pertinent positives and negatives are included in the history.  GENERAL: No fevers or chills. HEENT: No change in vision, no earache, sore throat or sinus congestion. NECK: No pain or stiffness. CARDIOVASCULAR: No chest pain or pressure. No palpitations. PULMONARY: No shortness of breath, cough or wheeze. GASTROINTESTINAL: No abdominal pain, nausea, vomiting or diarrhea, melena or bright red blood per rectum. GENITOURINARY: No urinary frequency, urgency, hesitancy or dysuria. MUSCULOSKELETAL: No joint or muscle pain, no back pain, no recent trauma. DERMATOLOGIC: No rash, no itching, no lesions. ENDOCRINE: No polyuria, polydipsia, no heat or cold intolerance. No recent change in weight. HEMATOLOGICAL: No anemia or easy bruising or bleeding. NEUROLOGIC: No headache, seizures, numbness, tingling or weakness. PSYCHIATRIC: No depression, no loss of interest in normal activity or change in sleep pattern.     Exam:   BP 140/88   Ht 5\' 5"  (1.651 m)   Wt 134 lb (60.8 kg)   LMP 05/01/2009   BMI 22.30 kg/m   Body mass index is 22.3  kg/m.  General appearance : Well developed well nourished female. No acute distress HEENT: Eyes: no retinal hemorrhage or exudates,  Neck supple, trachea midline, no carotid bruits, no thyroidmegaly Lungs: Clear to auscultation, no rhonchi or wheezes, or rib retractions  Heart: Regular rate and rhythm, no murmurs or gallops Breast:Examined in sitting and supine position were symmetrical in appearance, no palpable masses or tenderness,  no skin retraction, no nipple inversion, no nipple discharge, no skin discoloration, no axillary or supraclavicular lymphadenopathy Abdomen: no palpable masses or tenderness, no rebound or guarding Extremities: no edema or skin discoloration or tenderness  Pelvic: Vulva: Normal             Vagina: No gross lesions or discharge  Cervix: No gross lesions or discharge  Uterus AV, normal size, shape and consistency, non-tender and mobile  Adnexa  Without masses or tenderness  Anus: Normal   Assessment/Plan:  59 y.o. female for annual exam   1. Well female exam with routine gynecological exam Normal gynecologic exam in menopause.  Pap test November 2019 was negative, will repeat Pap test next year.  Breast exam normal.  Screening mammogram February 2021 was negative.  Colonoscopy 2014.  Health labs with family physician.  Body mass index 22.3.  Will you resume physical activities as she has recovered  from left hip replacement.  Still under investigation by GI for nausea and vomiting persisting postop.  2. Postmenopausal Well on no hormone replacement therapy.  No postmenopausal bleeding.  3. Osteopenia of multiple sites Osteopenia on bone density in 2017.  Will repeat a bone density here now.  Vitamin D supplements, calcium intake of 1200 mg daily and regular weightbearing physical activity is recommended. - DG Bone Density; Future  Princess Bruins MD, 9:05 AM 11/26/2019

## 2019-11-29 ENCOUNTER — Other Ambulatory Visit (INDEPENDENT_AMBULATORY_CARE_PROVIDER_SITE_OTHER): Payer: BC Managed Care – PPO

## 2019-11-29 ENCOUNTER — Encounter: Payer: Self-pay | Admitting: Physician Assistant

## 2019-11-29 ENCOUNTER — Ambulatory Visit: Payer: BC Managed Care – PPO | Admitting: Physician Assistant

## 2019-11-29 ENCOUNTER — Other Ambulatory Visit: Payer: Self-pay

## 2019-11-29 VITALS — BP 126/86 | HR 124 | Temp 98.4°F | Ht 65.5 in | Wt 136.2 lb

## 2019-11-29 DIAGNOSIS — R112 Nausea with vomiting, unspecified: Secondary | ICD-10-CM

## 2019-11-29 DIAGNOSIS — R109 Unspecified abdominal pain: Secondary | ICD-10-CM | POA: Diagnosis not present

## 2019-11-29 DIAGNOSIS — R935 Abnormal findings on diagnostic imaging of other abdominal regions, including retroperitoneum: Secondary | ICD-10-CM

## 2019-11-29 DIAGNOSIS — R634 Abnormal weight loss: Secondary | ICD-10-CM

## 2019-11-29 DIAGNOSIS — Z01818 Encounter for other preprocedural examination: Secondary | ICD-10-CM

## 2019-11-29 LAB — TSH: TSH: 0.47 u[IU]/mL (ref 0.35–4.50)

## 2019-11-29 LAB — B12 AND FOLATE PANEL
Folate: 5.7 ng/mL — ABNORMAL LOW (ref >5.9)
Vitamin B-12: 233 pg/mL (ref 211–911)

## 2019-11-29 LAB — IBC + FERRITIN
Ferritin: 5.5 ng/mL — ABNORMAL LOW (ref 10.0–291.0)
Iron: 42 ug/dL (ref 42–145)
Saturation Ratios: 11 % — ABNORMAL LOW (ref 20.0–50.0)
Transferrin: 272 mg/dL (ref 212.0–360.0)

## 2019-11-29 LAB — SEDIMENTATION RATE: Sed Rate: 16 mm/hr (ref 0–30)

## 2019-11-29 LAB — C-REACTIVE PROTEIN: CRP: <1 mg/dL (ref 0.5–20.0)

## 2019-11-29 MED ORDER — ONDANSETRON HCL 4 MG PO TABS: 4.0000 mg | ORAL_TABLET | Freq: Four times a day (QID) | ORAL | 4 refills | Status: DC | PRN

## 2019-11-29 MED ORDER — DICYCLOMINE HCL 10 MG PO CAPS: 10.0000 mg | ORAL_CAPSULE | Freq: Three times a day (TID) | ORAL | 3 refills | Status: DC

## 2019-11-29 NOTE — Patient Instructions (Signed)
If you are age 58 or older, your body mass index should be between 23-30. Your Body mass index is 22.33 kg/m. If this is out of the aforementioned range listed, please consider follow up with your Primary Care Provider.  If you are age 76 or younger, your body mass index should be between 19-25. Your Body mass index is 22.33 kg/m. If this is out of the aformentioned range listed, please consider follow up with your Primary Care Provider.   You have been scheduled for an endoscopy. Please follow written instructions given to you at your visit today. If you use inhalers (even only as needed), please bring them with you on the day of your procedure.  Your provider has requested that you go to the basement level for lab work before leaving today. Press "B" on the elevator. The lab is located at the first door on the left as you exit the elevator.  Due to recent changes in healthcare laws, you may see the results of your imaging and laboratory studies on MyChart before your provider has had a chance to review them.  We understand that in some cases there may be results that are confusing or concerning to you. Not all laboratory results come back in the same time frame and the provider may be waiting for multiple results in order to interpret others.  Please give [place] 48 hours in order for your provider to thoroughly review all the results before contacting the office for clarification of your results.   Continue Zofran  Mg every 6 hours as needed.  START Bentyl 10 mg 1 tablet by mouth a half hour prior to meals. This has been sent to your pharmacy.

## 2019-11-29 NOTE — Progress Notes (Signed)
Subjective:    Patient ID: Cristina Conner, female    DOB: 11/26/1960, 59 y.o.   MRN: [id number]  HPI Cristina Conner is a pleasant 59 year old white female, established with Dr. Carlean Purl who comes in today after recent ER visit on 11/17/2019 at which time she had an abnormal CT. Patient says her current symptoms have been present since the end of December.  Initially she had onset of recurrent episodes of nausea and vomiting then developed a lot of burping.  Since that time she has had continued daily nausea.  She is now taking Zofran fairly regularly which is helpful.  She has developed epigastric and mid low abdominal pain.  She consistently has had increase in abdominal discomfort/pain after eating.  This may occur 1 to 2 hours after eating last for a couple of hours and then gradually subsides.  She says she usually feels best between about 9 PM and 6 AM when she has not eaten.  Her appetite has been decreased and she is lost about 15 pounds since December.  She had had some associated mild constipation, no melena or hematochezia.  No fevers. CT of the abdomen and pelvis done on 11/17/2019 shows a 10 mm hepatic hemangioma and focal fatty changes.  There is wall thickening of the proximal to mid small bowel extending into the left mid abdomen with some surrounding hazy opacity of the mesentery and a trace amount of ascites.  Etiology felt to be infectious versus inflammatory. Labs on 11/17/2019 WBC of 10.1 hemoglobin 12.3 hematocrit of 41 MCV of 78.  Chemistries unremarkable.  She was given a course of Cipro and Flagyl which she has completed and did not notice any real changes in her symptoms with antibiotics. She had recently been instructed by Dr. Carlean Purl to stop lisinopril which she had been on over the past 4 years.  She thinks she stopped it just prior to that ER visit and has appointment with her PCP next week to discuss alternatives. She says she is not feeling  well in general, has weakness, intermittent  lightheadedness.  She also mentions that her heart rate has been fairly consistently elevated over the past 8 or 9 months. EGD in February 2021 was unremarkable. Review of Systems Pertinent positive and negative review of systems were noted in the above HPI section.  All other review of systems was otherwise negative.  Outpatient Encounter Medications as of 11/29/2019  Medication Sig  . eszopiclone (LUNESTA) 1 MG TABS tablet Take 1 mg by mouth at bedtime as needed for sleep.   Marland Kitchen ondansetron (ZOFRAN) 4 MG tablet Take 1 tablet (4 mg total) by mouth every 6 (six) hours as needed for nausea or vomiting.  . ondansetron (ZOFRAN-ODT) 8 MG disintegrating tablet Take 1 tablet (8 mg total) by mouth every 8 (eight) hours as needed for nausea or refractory nausea / vomiting.  . [DISCONTINUED] ondansetron (ZOFRAN) 4 MG tablet Take 1 tablet (4 mg total) by mouth every 6 (six) hours as needed for nausea or vomiting.  . dicyclomine (BENTYL) 10 MG capsule Take 1 capsule (10 mg total) by mouth 3 (three) times daily before meals.  . [DISCONTINUED] ciprofloxacin (CIPRO) 500 MG tablet Take 1 tablet (500 mg total) by mouth every 12 (twelve) hours.  . [DISCONTINUED] famotidine (PEPCID) 20 MG tablet Take 20 mg by mouth 2 (two) times daily.  . [DISCONTINUED] hyoscyamine (LEVSIN SL) 0.125 MG SL tablet Place 1 tablet (0.125 mg total) under the tongue every 6 (six) hours as  needed. (Patient taking differently: Place 0.125 mg under the tongue every 6 (six) hours as needed for cramping. )  . [DISCONTINUED] lisinopril-hydrochlorothiazide (ZESTORETIC) 20-12.5 MG tablet TAKE 1 TABLET BY MOUTH EVERY DAY  . [DISCONTINUED] metroNIDAZOLE (FLAGYL) 500 MG tablet Take 1 tablet (500 mg total) by mouth 2 (two) times daily.  . [DISCONTINUED] omeprazole (PRILOSEC) 20 MG capsule Take 1 capsule (20 mg total) by mouth daily before breakfast.   No facility-administered encounter medications on file as of 11/29/2019.   Allergies  Allergen  Reactions  . Hydrocodone Nausea And Vomiting   Patient Active Problem List   Diagnosis Date Noted  . Status post left hip replacement 09/07/2019  . 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 Primary localized osteoarthritis of left hip 09-12-2019  . Death of family member-  husband passed 04/29/2019  . 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 Sleep difficulties 09/09/2018  . Pre-diabetes 03/09/2018  . Postmenopausal state- went thru early 40's 01/02/2018  . Primary osteoarthritis of left hip 01/02/2018  . Caregiver burden- for husband with ALLeukemia 01/02/2018  . Left hip pain 01/02/2018  . Osteopenia 07/31/2016  . Hypertension 05/20/2011  . Menopausal state 05/20/2011   Social History   Socioeconomic History  . Marital status: Widowed    Spouse name: Not on file  . Number of children: Not on file  . Years of education: Not on file  . Highest education level: Not on file  Occupational History  . Not on file  Tobacco Use  . Smoking status: Never Smoker  . Smokeless tobacco: Never Used  Substance and Sexual Activity  . Alcohol use: Not Currently  . Drug use: No  . Sexual activity: Not Currently    Partners: Male    Birth control/protection: Post-menopausal    Comment: 1st intercourse- 88, partners- 61,  married- 48 yrs   Other Topics Concern  . Not on file  Social History Narrative   Widowed in summer 2020   Former smoker no alcohol or drug use   Social Determinants of Radio broadcast assistant Strain:   . Difficulty of Paying Living Expenses:   Food Insecurity:   . Worried About Charity fundraiser in the Last Year:   . Arboriculturist in the Last Year:   Transportation Needs:   . Film/video editor (Medical):   Marland Kitchen Lack of Transportation (Non-Medical):   Physical Activity:   . Days of Exercise per Week:   . Minutes of Exercise per Session:   Stress:   . Feeling of Stress :   Social Connections:   . Frequency of Communication with Friends and Family:   . Frequency of  Social Gatherings with Friends and Family:   . Attends Religious Services:   . Active Member of Clubs or Organizations:   . Attends Archivist Meetings:   Marland Kitchen Marital Status:   Intimate Partner Violence:   . Fear of Current or Ex-Partner:   . Emotionally Abused:   Marland Kitchen Physically Abused:   . Sexually Abused:     Ms. Kieschnick family history includes Diabetes in her mother; Heart disease in her mother; Hypertension in her father and mother.      Objective:    Vitals:   11/29/19 0843  BP: 126/86  Pulse: (!) 124  Temp: 98.4 F (36.9 C)    Physical Exam Well-developed well-nourished WF in no acute distress.  Height, [id number] BMI22.3  HEENT; nontraumatic normocephalic, EOMI, PER R LA, sclera anicteric. Oropharynx;not examined Neck; supple,  no JVD Cardiovascular; tachy regular rate(100) and rhythm with S1-S2, no murmur rub or gallop Pulmonary; Clear bilaterally Abdomen; soft, , nondistended, she is tender in the hypogastrium and mid abdomen no guarding no palpable mass or hepatosplenomegaly, bowel sounds are somewhat hyper active Rectal; not done today skin; benign exam, no jaundice rash or appreciable lesions Extremities; no clubbing cyanosis or edema skin warm and dry Neuro/Psych; alert and oriented x4, grossly nonfocal mood and affect appropriate       Assessment & Plan:   #28 59 year old white female with 58-month history of persistent nausea, intermittent vomiting, postprandial abdominal pain located in the mid abdomen, weight loss of 15 pounds and now with abnormal CT of the abdomen and pelvis showing wall thickening of the proximal to mid small bowel with surrounding hazy opacity of the mesentery and a trace amount of ascites. Doubt infectious due to duration of symptoms, rule out IBD, no peripheral eosinophilia to suggest eosinophilic gastroenteritis though still need to consider, rule out ACE induced angioedema.  She has been off lisinopril over the past 3 weeks.   Rule out other small bowel inflammatory process/neoplasm  #2 hypertension #3.  Osteoarthritis status post left hip replacement  Plan; patient encouraged to push liquids and eat 5-6 small meals per day to increase calorie intake.  We also discussed adding protein shakes twice daily. Continue Zofran 4 mg every 6 hours as needed for nausea, refill sent. Start trial of dicyclomine 10 mg 1/2-hour AC. Sed rate, CRP, TSH and iron studies. Patient will be scheduled for enteroscopy per Dr. Carlean Purl.  Procedure was discussed in detail with the patient including indications risks and benefits and she is agreeable to proceed. Further recommendations pending findings at enteroscopy.   Genia Harold PA-C 11/29/2019   Cc: Mellody Dance, DO

## 2019-12-01 ENCOUNTER — Other Ambulatory Visit: Payer: Self-pay

## 2019-12-01 MED ORDER — INTEGRA 62.5-62.5-40-3 MG PO CAPS: 62.5000 mg | ORAL_CAPSULE | Freq: Every day | ORAL | 6 refills | Status: DC

## 2019-12-02 ENCOUNTER — Encounter: Payer: Self-pay | Admitting: Family Medicine

## 2019-12-07 ENCOUNTER — Ambulatory Visit (INDEPENDENT_AMBULATORY_CARE_PROVIDER_SITE_OTHER): Payer: BC Managed Care – PPO | Admitting: Family Medicine

## 2019-12-07 ENCOUNTER — Encounter: Payer: Self-pay | Admitting: Family Medicine

## 2019-12-07 ENCOUNTER — Other Ambulatory Visit: Payer: Self-pay

## 2019-12-07 VITALS — BP 130/77 | HR 87 | Temp 97.6°F | Resp 14 | Ht 66.0 in | Wt 136.0 lb

## 2019-12-07 DIAGNOSIS — R1013 Epigastric pain: Secondary | ICD-10-CM

## 2019-12-07 DIAGNOSIS — F329 Major depressive disorder, single episode, unspecified: Secondary | ICD-10-CM

## 2019-12-07 DIAGNOSIS — Z634 Disappearance and death of family member: Secondary | ICD-10-CM | POA: Diagnosis not present

## 2019-12-07 DIAGNOSIS — K30 Functional dyspepsia: Secondary | ICD-10-CM

## 2019-12-07 DIAGNOSIS — R112 Nausea with vomiting, unspecified: Secondary | ICD-10-CM

## 2019-12-07 DIAGNOSIS — F411 Generalized anxiety disorder: Secondary | ICD-10-CM | POA: Diagnosis not present

## 2019-12-07 MED ORDER — FLUOXETINE HCL 10 MG PO TABS: ORAL_TABLET | ORAL | 0 refills | Status: DC

## 2019-12-07 NOTE — Patient Instructions (Addendum)
Please call [facility] and make an appointment with your new counselor.  This is very important in addition to exercising as much as you can daily, getting back to being active and doing things out of the home if you feel safe to do so due to Covid and, please make sure you engage in some type of reading of loss and grief etc. which I think would be helpful.   Please continue to check your blood pressure as it seems under great control and at goal of under 140/90 on a regular basis    Please follow-up with gastroenterology as as instructed       What causes depression?  You may already know that depression (MDD) is a complex medical condition. The exact cause is unknown, but it's most likely a combination of several factors: genetic, biological, environmental, and psychological.  Depression can run in families, but not everyone with depression has a family history. Scientists are trying to identify genes involved to understand the role that genetics may play in depression.  Environmental factors that may play a role in depression include trauma, loss of a loved one, a difficult relationship, or other stressful situations. Some depressive episodes may occur without an obvious trigger.  If you think you may have depression, there is something you can do about it. The first step is to talk to your healthcare professional about your symptoms.  You can also read about the symptoms of depression below to help you get a better understanding about the types of changes you may be experiencing.    Depression and the brain:  It's thought that certain neurotransmitters such as serotonin, norepinephrine, and dopamine (chemicals that the brain uses to communicate) are out of balance when you're depressed  Serotonin is one of the chemicals believed to be affected by depression - it is thought to be involved in the regulation of mood and other functions    What are the symptoms  of depression?  Emotional. Physical. Cognitive.  1. Little interest or pleasure in doing things 2. Feeling down, depressed, or hopeless 3. Trouble falling or staying asleep, or sleeping too much 4. Feeling tired or having little energy 5. Poor appetite, overeating, or considerable weight changes 6. Feeling bad about yourself - that you are a failure or having a lot of guilt 7. Difficulty concentrating on things or making decisions 8. Moving or speaking slowly, so that other people have noticed, or being so restless that you've been moving around a lot 9. Thoughts that you would be better off dead, or of hurting yourself in some way   Depression is a complex medical condition that may make it hard to feel like yourself. Talk to your healthcare professional about all of your symptoms and their impact.   You may be suffering from depression if you are experiencing five or more of the symptoms above including either depressed mood or decreased interest or pleasure. In addition, depression is also associated with:   Symptoms that are new or noticeably worse compared to what they were prior to the episode  Symptoms that persist for most of the day, nearly every day for at least two consecutive weeks  Episodes that are accompanied by clinically significant distress or impaired functioning    Myth:  Depression is something you can just "snap out of".   Fact:  No one chooses to be depressed, and it is not caused by laziness, weakness, or simply feeling sad. Depression is a complex  medical condition. The exact cause is unknown, but it's most likely a combination of several factors: genetic, biological, environmental, and psychological. You cannot control your loved one's recovery, but you can support them along the way.   Myth:  Depression only affects your emotions  Fact:  Depression can be emotional, physical, and cognitive symptoms. It can result in symptoms that include little  interest or pleasure in doing things; feeling down, depressed, or hopeless; trouble falling or staying asleep, or sleeping too much; feeling tired or having little energy; poor appetite, overeating, or considerable weight changes; feeling bad about yourself, like you are a failure or having a lot of guilt; trouble concentrating on things or making decisions; moving or speaking slowly, so that other people have noticed, or being so restless that you've been moving around a lot; and thoughts that you would be better off dead, or of hurting yourself in some way. People with depression do not all experience the same symptoms, and the severity, frequency, and duration can vary         What is Chronic Stress Syndrome, Symptoms & Ways to Deal With it   What is Chronic Stress Syndrome?  Chronic Stress Syndrome is something which can now be called as a medical condition due to the amount of stress an individual is going through these days. Chronic Stress Syndrome causes the body and mind to shutdown and the person has no control over himself or herself. Due to the demands of modern day life and the hardship throughout day and night takes its toll over a period of time and the body and brain starts demanding rest and a break. This leads to certain symptoms where your performance level starts to dip at work, you become irritable both at work and at home, you may stop enjoying activities you previously liked, you may become depressed, you may get angry for even small things. Chronic Stress Syndrome can significantly impact your quality life. Thus it is important understand the symptoms of Chronic Stress Syndrome and react accordingly in order to cope up with it.  It is important to note here that a balanced work-home equation should be drawn to cut down symptoms of Chronic Stress Syndrome. Minor stressors can be overcome by the body's inbuilt stress response but when there is unending stress for a long period of  time then an external help is required to ease the stress.  Chronic Stress Syndrome can physically and psychologically drain you over a period of time. For such cases stress management is the best way to cope up with Chronic Stress Syndrome. If Chronic Stress Syndrome is not treated then it may result in many health hazards like anxiety, muscle pain, insomnia, and high blood pressure along with a compromised immune system leading to frequent infections and missed days from work.    What are the Symptoms of Chronic Stress Syndrome?   The symptoms of Chronic Stress Syndrome are variable and range from generalized symptoms to emotional symptoms along with behavioral and cognitive symptoms. Some of these symptoms have been delineated below:  Generalized Symptoms of Chronic Stress Syndrome are: Anxiety Depression Social isolation Headache Abdominal pain Lack of sleep Back pain Difficulty in concentrating Hypertension Hemorrhoids Varicose veins Panic attacks/ Panic disorder Cardiovascular diseases.   Some of the Emotional Symptoms of Chronic Stress Syndrome are: To become easily agitated, moody and frustrated Feeling overwhelmed which makes you feel like you are losing control. Having difficulty relaxing and have a peaceful mind Having low  self esteem Feeling lonely Feeling worthless Feeling depressed Avoiding social environment.   Some of the Physical Symptoms of Chronic Stress Syndrome are: Headaches Lethargy Alternating diarrhea and constipation Nausea Muscles aches and pains Insomnia Rapid heartbeat and chest pain Infections and frequent colds Decreased libido Nervousness and shaking Tinnitus Sweaty palms Dry mouth Clenched jaw.  Some of the Cognitive Symptoms of Chronic Stress Syndrome are: Constant worrying Racing thoughts Disorganization and forgetfulness Inability to focus Poor judgment Abundance of negativity.  Some of the Behavioral Symptoms of  Chronic Stress Syndrome are: Changes in appetite with less desire to eat Avoiding responsibilities Indulgence in alcohol or recreational drug use Increased nail biting and being fidgety Ways to Deal With Chronic Stress Syndrome    Chronic Stress Syndrome is not something which cannot be addressed. A bit of effort from your side in the form of lifestyle modifications, a little bit of exercise, a balanced work life equation can do wonders and help you get rid of Chronic Stress Syndrome.  Get Proper Sleep: It has been proved that Chronic Stress Syndrome causes loss of sleep where an individual may not even be able to sleep for days unending. This may result in the individual feeling lethargic and unable to focus at work the following morning. This may lead to decreased performance at work. Thus, it is important to have a good sleep-wake cycle. For this, try and not drink any caffeinated beverage about four hours prior to going to sleep, as caffeine pumps up the adrenaline and causes you to stay awake resulting ultimately in Chronic Stress Syndrome.  Avoid Alcohol and Drugs: Another way to get rid of Chronic Stress Syndrome is lifestyle modifications. Stay away from alcohol and other recreational drugs. Take Short Frequent Breaks at Work: Try to take frequent breaks from work and do not work continuously. Try and manage your work in such a way that you even meet your deadline and come home on time for a happy dinner with family. A good time spent with family and kids does wonders in not only dealing with Chronic Stress Syndrome but also preventing it.  Become Physically Active: Another step towards getting rid of Chronic Stress Syndrome is physical activity. If you do not have time to spend at the gym then at least try and go for daily walks for about half an hour a day which not only keeps the stress away but also is good for your overall health. Physical activity leads to production of endorphins which  will make you feel relaxed and feel good.  Healthy Diet Can Help You Deal With Chronic Stress Syndrome: Have a balanced and healthy diet is another step towards a stress free life and keeping Chronic Stress Syndrome at [facility] [place]. If time is a constraint then you can try eating three small meals a day. Try and avoid fast foods and take foods which are healthy and rich in proteins, fiber, and carbohydrates to boost your energy system.  Music Can Soothe Your Mind: Light music is one of the best and most effective relaxation techniques that one can try to overcome stress. It has shown to calm down the mind and take you away from all the stressors that you may be having. These days it is also being used as a therapy in some institutes for overcoming stress. It is important here to discuss the importance of a good social support system for patients with Chronic Stress Syndrome, as a good social support framework can do wonders in taking  the stress away from the patient and overcoming Chronic Stress Syndrome.  Meditation Can Help You Deal With Chronic Stress Syndrome Effectively: Meditation and yoga has also shown to be quite effective in relaxing the mind and coping up with Chronic Stress Syndrome   In cases where these measures are not helpful, then it is time for you to consult with a skilled psychologist or a psychiatrist for potential therapies or medications to control the stress response.   The psychologist can help you with a variety of steps for coping up with Chronic Stress Syndrome. Relaxation techniques and behavioral therapy are some of the methods employed by psychologists. In some cases, medications can also be given to help relax the patient.  Since Chronic Stress Syndrome is both emotionally and physically draining for the patient and it also adversely affects the family life of the patient hence it is important for the patient to recognize the condition and taking steps to cope up with it. Escaping  measures like alcohol and drug use are of no help as they only aggravate the condition apart from their other health hazards. If this condition is ignored or left untreated it can lead to various medical conditions like anxiety and depression and various other medical conditions.  Last but not least, smile as often as you can as it is the best gift that you can give to someone. The best way to stay relaxed is to have a good smile, exercise daily, spend time with your family, meditation and if required consultation with a good psychologist so that you can live a stress free life and overcome the symptoms of Chronic Stress Syndrome.     ------------------------------------------------------------------------------------------------------------------   -Also we need to transition your brain into thinking more positively.  These tasks below are some things I want you to do every day 1)  write 3 new things that you are grateful for every day for 21 days  2)  exercise daily- walk for 15 minutes twice a day every day 3)  you are going to journal every day about one positive experience that you had 4)  meditate every day.  You can go on YouTube and look for 15-minute relaxation meditation or what ever.  But we need to make sure that you are in the moment and relaxing and deep breathing every day 5)  Write 1 positive email every day to praise someone in your life     - If you have insomnia or difficulty sleeping, this information is for you:  - Avoid caffeinated beverages after lunch,  no alcoholic beverages,  no eating within 2-3 hours of lying down,  avoid exposure to blue light before bed,  avoid daytime naps, and  needs to maintain a regular sleep schedule- go to sleep and wake up around the same time every night.   - Resolve concerns or worries before entering bedroom:  Discussed relaxation techniques with patient and to keep a journal to write down fears\ worries.  I suggested seeing a  counselor for CBT.   - Recommend patient meditate or do deep breathing exercises to help relax.   Incorporate the use of white noise machines or listen to "sleep meditation music", or recordings of guided meditations for sleep from YouTube which are free, such as  "guided meditation for detachment from over thinking"  by Mayford Knife.       Behavioral Health/ Counseling Referrals   Angelyn Punt:  [place] [place], [place] [phone]  [phone] (not exclusive, is a general counselor, but does have special interest in eating disorders, body dysmorphia syndrome etc)       lauren averitt- counselor;  [place], [place]  [email or web address] [phone] (text is preferred) [address] [place], [place] [address] She is VERY comfortable with live video visits- which she does regularly    [place]    [facility]: Counselors : Lynden Ang and partner Phil Dopp in Adult & Pediatric Nutrition Counseling Therapy for:  Eating Disorders  Disordered & Emotional Eating  Non-Diet Nutrition Counseling  Family Feeding Coaching  Weight- Neutral Diabetes Management     Anderson Malta, personal counselor in [place], specializing in marriage counseling    Dr. Tomi Bamberger, PHD Dr. Tomi Bamberger, PHD is a counselor in [place], [place].  [address] [place], [place] [address] [place] [phone]   Kristie Cowman, [place]  [phone] [email or web address] ( she does [place] and faith-based coaching and counseling )    [employer]- ( faith-based counseling ) Address: [place]. [place], [place] [address] [phone] [address] for appointments [phone] Fax Hours: Monday - Thursday 8:00am-6:00pm Closed for lunch 12-1Thursday only Friday: Closed all  day   Steffanie Rainwater: [phone] or Meg [phone] -counselors in [place] who are faith based   -Also Ms. Marya Fossa - [facility]. Darrick Meigs based counseling.    [facility] Nunzio Cobbs, LCSW, ACSW, M.ED.  -Nunzio Cobbs is a licensed clinical social worker in practice over 35 years and with Dr. Chucky May for the last 10 years.  -She sees adults, adolescents, children & families and couples. -Services are provided for mood and anxiety disorders, marital issues, family or parent/child problems, parenting, co-dependency, gender issues, trauma, grief, and stages of life issues. She also provides critical incident stress debriefing.  -Pamala Hurry accepts many employee assistance programs (EAP), [employer], Pharmacist, hospital.  PHONE  [phone]                FAX [phone]   Rodena Goldmann [email or web address] [employer]- gen counseling;  PHD   Wilber Oliphant, MSW [address] [place]   [place] Apolonio Schneiders, PhD [address] [facility] [phone]   [place] [facility]- children [address], [place], [place]    [facility]- various counselors [place], [place] [phone] ( one patient saw Gayland Curry and really liked her)    Lanelle Bal Professional Counselor Counseling and [employer] [phone]   [place] abuse [facility]-Clinical Manager [address] [place] [phone] [place] [address] Christian Delmer Islam, PhD **   -adult, adolescent, and child [place], PhD Leitha Bleak, LCSW Jillene Bucks, PhD-child, adolescent and adults   [facility] [address], [place] [phone] Merilyn Baba, MS-child, adolescent and adults Lennart Pall, PhD-adolescent and adults   [facility]-grief, terminal illness [place], [place] [address], [place] [address] [place]   [facility] [place], [place]   [facility] [address] [place] [phone]   [facility] [address],  [place] [phone]   [facility] [address], [place]   [facility] [address] [place] [address], [place]

## 2019-12-07 NOTE — Progress Notes (Signed)
Impression and Recommendations:    1. Reactive depression   2. Death of family member-  husband passed   3. GAD (generalized anxiety disorder)   4. Epigastric abdominal pain   5. Nausea and vomiting, intractability of vomiting not specified, unspecified vomiting type   6. Indigestion     Epigastric abdominal pain, Indigestion, Nausea and vomiting, intractability of vomiting not specified, unspecified vomiting type - Patient is being followed by GI for her symptoms, and has an appointment upcoming on April 12th. - Patient understands that her treatment plan will be continue to be monitored and adjusted by [place] specialist along with [place] since they feel is it largely emotionally mediated - Will continue to monitor alongside gastroenterology.   Reactive depression, Death of family member-  husband passed - Per patient, feeling more depressed than anxious, with depression worsening at this time.  - Pt has been managed on Prozac and Buspar in the past. - For management of depression, discussed resuming Prozac today.  Pt tolerated lower doses of 20 mg and less well.  Had more anxiety then and higher doses made her jittery See med list.  - Strongly advised patient to establish with therapist/counselor as discussed.  Rec once wkly or once every other week - Handout provided to patient today regarding additional options for therapists.  - Encouraged patient to continue attending her grief support group.  - Advised pt to ask her support group regarding recommendations for self-help books for coping with grief and loss.  - In addition to therapy/counseling and prescription intervention, reviewed the "spokes of the wheel" of mood and health management.  Stressed the importance of ongoing prudent habits, including regular exercise, appropriate sleep hygiene, healthful dietary habits, and prayer/meditation to relax.  - Will continue to monitor.    Sleeping too much - Per patient, sleeping  too much at this time. Could sleep all day  - Discussed that patient's symptoms may be mediated by worsening depression, and most likely improve as patient's depression is more controlled.  - Patient will begin Prozac low dose for depression.  See med list.  - Will continue to monitor.   Health Counseling & Preventative Maintenance - Advised patient to continue working toward exercising to improve overall mental, physical, and emotional health.    - Encouraged patient to engage in daily physical activity as tolerated.  - Patient knows to walk on flat, forgiving surfaces, and start with 5 minutes of activity per day, working toward a formal exercise routine.    - Recommended that the patient eventually strive for at least 150 minutes of moderate cardiovascular activity per week according to guidelines established by the [facility].   - Healthy dietary habits encouraged, including low-carb, and high amounts of lean protein in diet.   - Patient should also consume adequate amounts of water.  - Health counseling performed.  All questions answered.   Education and routine counseling performed. Handouts provided.   Meds ordered this encounter  Medications  . FLUoxetine (PROZAC) 10 MG tablet    Sig: Use one half tablet daily for 1 week then increase to 1 tablet daily    Dispense:  30 tablet    Refill:  0    Gross side effects, risk and benefits, and alternatives of medications and treatment plan in general discussed with patient.  Patient is aware that all medications have potential side effects and we are unable to predict every side effect or drug-drug interaction that may occur.  Patient will call with any questions prior to using medication if they have concerns.  Expresses verbal understanding and consents to current therapy and treatment regimen.  No barriers to understanding were identified.    Please see AVS handed out to patient at the end of our visit for further patient  instructions/ counseling done pertaining to today's office visit.   Return for f/up in 4-6 weeks for mood management restarted prozac low dose, go to counselor.     Note:  This document was prepared using Dragon voice recognition software and may include unintentional dictation errors.   The [place] was signed into law in 2016 which includes the topic of electronic health records.  This provides immediate access to information in MyChart.  This includes consultation notes, operative notes, office notes, lab results and pathology reports.  If you have any questions about what you read please let us know at your next visit or call us at the office.  We are right here with you.    This case required medical decision making of at least moderate complexity.  This document serves as a record of services personally performed by Mellody Dance, DO. It was created on her behalf by [name], a trained medical scribe. The creation of this record is based on the scribe's personal observations and the provider's statements to them.    The above documentation from [name], medical scribe, has been reviewed by Marjory Sneddon, D.O.      --------------------------------------------------------------------------------------------------------------------------------------------------------------------------------------------------------------------------------------------    Subjective:    Cristina Conner, am serving as scribe for Dr.Octavie Westerhold.  CC:  Chief Complaint  Patient presents with  . Depression  . Anxiety    HPI: Cristina Conner is a 59 y.o. female who presents to [place] [facility] today for follow-up of mood.   She did not come in sooner due to difficulty being scheduled.  - Gastrointestinal Concerns Notes that she had an EGD done through gastroenterology, and "they saw something in my small intestine."  On the  twelfth of April, she is having "a lower scope and biopsy done on her small intestine" for further assessment of symptoms.  Her last colonoscopy was obtained in 2014.  She still vomits "every now and then," about once per week or less.  Whenever she is going to vomit, she starts to have stabbing pains, and "once I have the stabbing pains going on, something builds up inside of me, and I just have to vomit."  Feels that her stools are more constipated than loose.  She continues to have indigestion, and notes she "has to burp a lot."  - Exercise Habits She has not been exercising regularly.  Notes after her hip replacement, she would go to physical therapy and start vomiting, and because of this, "I haven't been able to go back to PT like I need to."  - Mood Management Patient notes she was managed on Buspar in the past, along with something else.  On discussion during appointment, she was managed on fluoxetine.  She wanted to come off of fluoxetine in the past "because it made me jittery."  Says she "felt like she was rocking in my bed, and when I sat down, I was shaking my leg."  She did not experience the "jittery" feelings when first starting out on a lower dose of Prozac.  States "I don't know much about these medications.  I've never had to take these before."  She thinks she currently suffers more from depression now versus prior was a lot of GAD.  Believes at first, it was more anxiety, and now has flipped to depression.  "I feel sad all the time.  I have no motivation to do anything.  I lay in bed, and think I have to do this, this, and this, and I get up in the morning and do nothing."  - Sleeping Often Says "all I want to do is sleep."  She's sleeping a lot.  "I could go to bed at seven o'clock, if not earlier, and I can sleep all night long until the next morning, 7 o'clock, and when I get up, I'm still exhausted."  She feels she sleeps restfully overall; "I'll wake up some, but  I'm able to go back to sleep."  She feels she could stay in bed all day long.  Patient is in [facility] therapy, and has started looking for another counselor.  She did have a counselor in the past, "but I wasn't real pleased with him."  At present, she feels she needs to shop around until she finds someone who understands her.  She has gone to the website to seek alternative options for providers.  - Blood Pressure Notes that her blood pressure at home has been controlled.  She's been checking it and knows that in office today it was unusually high.  Running in 130's/80's at home, and not even taking her lisinopril at present.  Last 3 blood pressure readings in our office are as follows: BP Readings from Last 3 Encounters:  12/07/19 130/77  11/29/19 126/86  11/26/19 140/88   Filed Weights   12/07/19 0920  Weight: 136 lb (61.7 kg)    Depression screen [facility] 2/9 12/07/2019 09/20/2019 09/07/2019  Decreased Interest 3 1 1   Down, Depressed, Hopeless 3 2 0  PHQ - 2 Score 6 3 1   Altered sleeping 2 1 0  Tired, decreased energy 3 2 3   Change in appetite 2 2 1   Feeling bad or failure about yourself  2 0 0  Trouble concentrating 2 0 0  Moving slowly or fidgety/restless 2 3 0  Suicidal thoughts 1 0 0  PHQ-9 Score 20 11 5   Difficult doing work/chores Somewhat difficult Very difficult Not difficult at all  Some recent data might be hidden     GAD 7 : Generalized Anxiety Score 12/07/2019 09/20/2019 09/07/2019 08/09/2019  Nervous, Anxious, on Edge 2 2 0 0  Control/stop worrying 2 3 0 0  Worry too much - different things 2 3 0 1  Trouble relaxing 2 3 0 0  Restless 1 1 0 0  Easily annoyed or irritable 3 2 0 0  Afraid - awful might happen 3 0 0 0  Total GAD 7 Score 15 14 0 1  Anxiety Difficulty Very difficult Very difficult - Not difficult at all     Wt Readings from Last 3 Encounters:  12/07/19 136 lb (61.7 kg)  11/29/19 136 lb 4 oz (61.8 kg)  11/26/19 134 lb (60.8 kg)   BP Readings  from Last 3 Encounters:  12/07/19 130/77  11/29/19 126/86  11/26/19 140/88   Pulse Readings from Last 3 Encounters:  12/07/19 87  11/29/19 (!) 124  11/17/19 81   BMI Readings from Last 3 Encounters:  12/07/19 21.95 kg/m  11/29/19 22.33 kg/m  11/26/19 22.30 kg/m         Patient Care Team    Relationship Specialty Notifications  Start End  Mellody Dance, DO PCP - General Family Medicine  01/02/18   Druscilla Brownie, MD Consulting Physician Dermatology  01/02/18   Opthamology, [facility]  Ophthalmology  01/02/18   Princess Bruins, MD Consulting Physician Obstetrics and Gynecology  03/04/18   Melrose Nakayama, MD Consulting Physician Orthopedic Surgery  08/04/19      Patient Active Problem List   Diagnosis Date Noted  . Pre-diabetes 03/09/2018  . Hypertension 05/20/2011  . Osteopenia 07/31/2016  . Postmenopausal state- went thru early 40's 01/02/2018  . Status post left hip replacement 09/07/2019  . 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 Primary localized osteoarthritis of left hip 01-Sep-2019  . Death of family member-  husband passed 04/29/2019  . 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 Sleep difficulties 09/09/2018  . Primary osteoarthritis of left hip 01/02/2018  . Caregiver burden- for husband with ALLeukemia 01/02/2018  . Left hip pain 01/02/2018  . Menopausal state 05/20/2011    Past Medical history, Surgical history, Family history, Social history, Allergies and Medications have been entered into the medical record, reviewed and changed as needed.    Current Meds  Medication Sig  . dicyclomine (BENTYL) 10 MG capsule Take 1 capsule (10 mg total) by mouth 3 (three) times daily before meals.  . ondansetron (ZOFRAN) 4 MG tablet Take 1 tablet (4 mg total) by mouth every 6 (six) hours as needed for nausea or vomiting.    Allergies:  Allergies  Allergen Reactions  . Hydrocodone Nausea And Vomiting     Review of Systems: Review of Systems: General:   No F/C, wt loss  Pulm:   No DIB, SOB, pleuritic chest pain Card:  No CP, palpitations Abd:  No n/v/d or pain Ext:  No inc edema from baseline Psych: no SI/ HI    Objective:   Blood pressure 130/77, pulse 87, temperature 97.6 F (36.4 C), temperature source Oral, resp. rate 14, height 5\' 6"  (1.676 m), weight 136 lb (61.7 kg), last menstrual period 05/01/2009, SpO2 100 %. Body mass index is 21.95 kg/m. General:  Well Developed, well nourished, appropriate for stated age.  Neuro:  Alert and oriented,  extra-ocular muscles intact  HEENT:  Normocephalic, atraumatic, neck supple, no carotid bruits appreciated  Skin:  no gross rash, warm, pink. Cardiac:  RRR, S1 S2 Respiratory:  ECTA B/L and A/P, Not using accessory muscles, speaking in full sentences- unlabored. Vascular:  Ext warm, no cyanosis apprec.; cap RF less 2 sec. Psych:  No HI/SI, judgement and insight good, Euthymic mood. Full Affect.

## 2019-12-09 ENCOUNTER — Ambulatory Visit: Payer: BC Managed Care – PPO | Admitting: Family Medicine

## 2019-12-09 ENCOUNTER — Ambulatory Visit (INDEPENDENT_AMBULATORY_CARE_PROVIDER_SITE_OTHER): Payer: BC Managed Care – PPO

## 2019-12-09 ENCOUNTER — Encounter: Payer: Self-pay | Admitting: Internal Medicine

## 2019-12-09 ENCOUNTER — Other Ambulatory Visit: Payer: Self-pay | Admitting: Internal Medicine

## 2019-12-09 DIAGNOSIS — Z1159 Encounter for screening for other viral diseases: Secondary | ICD-10-CM

## 2019-12-09 LAB — SARS CORONAVIRUS 2 (TAT 6-24 HRS): SARS Coronavirus 2: NEGATIVE

## 2019-12-13 ENCOUNTER — Encounter: Payer: Self-pay | Admitting: Internal Medicine

## 2019-12-13 ENCOUNTER — Other Ambulatory Visit: Payer: Self-pay

## 2019-12-13 ENCOUNTER — Ambulatory Visit (AMBULATORY_SURGERY_CENTER): Payer: BC Managed Care – PPO | Admitting: Internal Medicine

## 2019-12-13 VITALS — BP 126/78 | HR 82 | Temp 95.9°F | Resp 17 | Ht 65.0 in | Wt 136.0 lb

## 2019-12-13 DIAGNOSIS — K529 Noninfective gastroenteritis and colitis, unspecified: Secondary | ICD-10-CM | POA: Diagnosis not present

## 2019-12-13 DIAGNOSIS — R634 Abnormal weight loss: Secondary | ICD-10-CM

## 2019-12-13 MED ORDER — SODIUM CHLORIDE 0.9 % IV SOLN: 500.0000 mL | Freq: Once | INTRAVENOUS | Status: DC

## 2019-12-13 NOTE — Patient Instructions (Addendum)
The exam was normal. I did take biopsies to see if there are microscopic changes that will give [place] a diagnosis.  Will call with the results and plans.  I appreciate the opportunity to care for you. Gatha Mayer, MD, [facility]  Resume previous diet Await pathology results  YOU HAD AN ENDOSCOPIC PROCEDURE TODAY AT [facility]:   Refer to the procedure report that was given to you for any specific questions about what was found during the examination.  If the procedure report does not answer your questions, please call your gastroenterologist to clarify.  If you requested that your care partner not be given the details of your procedure findings, then the procedure report has been included in a sealed envelope for you to review at your convenience later.  YOU SHOULD EXPECT: Some feelings of bloating in the abdomen. Passage of more gas than usual.  Walking can help get rid of the air that was put into your GI tract during the procedure and reduce the bloating. If you had a lower endoscopy (such as a colonoscopy or flexible sigmoidoscopy) you may notice spotting of blood in your stool or on the toilet paper. If you underwent a bowel prep for your procedure, you may not have a normal bowel movement for a few days.  Please Note:  You might notice some irritation and congestion in your nose or some drainage.  This is from the oxygen used during your procedure.  There is no need for concern and it should clear up in a day or so.  SYMPTOMS TO REPORT IMMEDIATELY:     Following upper endoscopy (EGD)  Vomiting of blood or coffee ground material  New chest pain or pain under the shoulder blades  Painful or persistently difficult swallowing  New shortness of breath  Fever of 100F or higher  Black, tarry-looking stools  For urgent or emergent issues, a gastroenterologist can be reached at any hour by calling [phone]. Do not use MyChart messaging for urgent concerns.    DIET:   We do recommend a small meal at first, but then you may proceed to your regular diet.  Drink plenty of fluids but you should avoid alcoholic beverages for 24 hours.  ACTIVITY:  You should plan to take it easy for the rest of today and you should NOT DRIVE or use heavy machinery until tomorrow (because of the sedation medicines used during the test).    FOLLOW UP: Our staff will call the number listed on your records 48-72 hours following your procedure to check on you and address any questions or concerns that you may have regarding the information given to you following your procedure. If we do not reach you, we will leave a message.  We will attempt to reach you two times.  During this call, we will ask if you have developed any symptoms of COVID 19. If you develop any symptoms (ie: fever, flu-like symptoms, shortness of breath, cough etc.) before then, please call [phone].  If you test positive for Covid 19 in the 2 weeks post procedure, please call and report this information to [place].    If any biopsies were taken you will be contacted by phone or by letter within the next 1-3 weeks.  Please call us at [phone] if you have not heard about the biopsies in 3 weeks.    SIGNATURES/CONFIDENTIALITY: You and/or your care partner have signed paperwork which will be entered into your electronic medical record.  These signatures attest to the fact that that the information above on your After Visit Summary has been reviewed and is understood.  Full responsibility of the confidentiality of this discharge information lies with you and/or your care-partner.

## 2019-12-13 NOTE — Progress Notes (Signed)
Pt's states no medical or surgical changes since previsit or office visit. 

## 2019-12-13 NOTE — Progress Notes (Signed)
Pt's states no medical or surgical changes since previsit or office visit.  LC - temp KA - vitals 

## 2019-12-13 NOTE — Op Note (Signed)
[facility] Patient Name: Gladine Plude Procedure Date: 12/13/2019 7:54 AM MRN: [id number] Endoscopist: Gatha Mayer , MD Age: 59 Referring MD:  Date of Birth: [birth year] Gender: Female Account #: [id number] Procedure:                Small bowel enteroscopy Indications:              Weight loss, Enteritis on CT Medicines:                Propofol per Anesthesia, Monitored Anesthesia Care Procedure:                Pre-Anesthesia Assessment:                           - Prior to the procedure, a History and Physical                            was performed, and patient medications and                            allergies were reviewed. The patient's tolerance of                            previous anesthesia was also reviewed. The risks                            and benefits of the procedure and the sedation                            options and risks were discussed with the patient.                            All questions were answered, and informed consent                            was obtained. Prior Anticoagulants: The patient has                            taken no previous anticoagulant or antiplatelet                            agents. ASA Grade Assessment: II - A patient with                            mild systemic disease. After reviewing the risks                            and benefits, the patient was deemed in                            satisfactory condition to undergo the procedure.                           After obtaining informed consent, the endoscope was  passed under direct vision. Throughout the                            procedure, the patient's blood pressure, pulse, and                            oxygen saturations were monitored continuously. The                            Endoscope was introduced through the mouth, and                            advanced to the proximal jejunum. The small bowel    enteroscopy was accomplished without difficulty.                            The patient tolerated the procedure well. Scope In: Scope Out: Findings:                 The esophagus was normal.                           The stomach was normal.                           There was no evidence of significant pathology in                            the entire examined duodenum.                           There was no evidence of significant pathology in                            the proximal jejunum. Biopsies were taken with a                            cold forceps for histology. Estimated blood loss                            was minimal. Complications:            No immediate complications. Estimated Blood Loss:     Estimated blood loss was minimal. Impression:               - Normal esophagus.                           - Normal stomach.                           - Normal examined duodenum.                           - The examined portion of the jejunum was normal.  Biopsied. Recommendation:           - Await pathology results.                           - The patient will be observed post-procedure,                            until all discharge criteria are met.                           - Patient has a contact number available for                            emergencies. The signs and symptoms of potential                            delayed complications were discussed with the                            patient. Return to normal activities tomorrow.                            Written discharge instructions were provided to the                            patient.                           - Resume previous diet. Gatha Mayer, MD 12/13/2019 8:30:39 AM This report has been signed electronically.

## 2019-12-13 NOTE — Progress Notes (Signed)
Report given to PACU, vss 

## 2019-12-15 ENCOUNTER — Telehealth: Payer: Self-pay

## 2019-12-15 ENCOUNTER — Telehealth: Payer: Self-pay | Admitting: *Deleted

## 2019-12-15 NOTE — Telephone Encounter (Signed)
  Follow up Call-  Call back number 12/13/2019 10/13/2019  Post procedure Call Back phone  # [phone]  Permission to leave phone message Yes Yes  Some recent data might be hidden     Patient questions:  Do you have a fever, pain , or abdominal swelling? No. Pain Score  0 *  Have you tolerated food without any problems? Yes.    Have you been able to return to your normal activities? Yes.    Do you have any questions about your discharge instructions: Diet   No. Medications  No. Follow up visit  No.  Do you have questions or concerns about your Care? Yes.    Actions: * If pain score is 4 or above: No action needed, pain <4.  1. Have you developed a fever since your procedure? no  2.   Have you had an respiratory symptoms (SOB or cough) since your procedure? no  3.   Have you tested positive for COVID 19 since your procedure no  4.   Have you had any family members/close contacts diagnosed with the COVID 19 since your procedure?  no   If yes to any of these questions please route to Joylene John, RN and Erenest Rasher, RN

## 2019-12-15 NOTE — Telephone Encounter (Signed)
No answer, left message to call back later today, B.Matsue Strom RN. 

## 2019-12-17 ENCOUNTER — Other Ambulatory Visit: Payer: Self-pay

## 2019-12-17 DIAGNOSIS — K529 Noninfective gastroenteritis and colitis, unspecified: Secondary | ICD-10-CM

## 2019-12-22 ENCOUNTER — Ambulatory Visit (INDEPENDENT_AMBULATORY_CARE_PROVIDER_SITE_OTHER): Payer: BC Managed Care – PPO | Admitting: Psychology

## 2019-12-22 DIAGNOSIS — F4323 Adjustment disorder with mixed anxiety and depressed mood: Secondary | ICD-10-CM

## 2019-12-28 ENCOUNTER — Encounter: Payer: Self-pay | Admitting: Internal Medicine

## 2019-12-28 ENCOUNTER — Ambulatory Visit (INDEPENDENT_AMBULATORY_CARE_PROVIDER_SITE_OTHER): Payer: BC Managed Care – PPO | Admitting: Internal Medicine

## 2019-12-28 DIAGNOSIS — K529 Noninfective gastroenteritis and colitis, unspecified: Secondary | ICD-10-CM

## 2019-12-28 DIAGNOSIS — K56699 Other intestinal obstruction unspecified as to partial versus complete obstruction: Secondary | ICD-10-CM

## 2019-12-28 NOTE — Progress Notes (Signed)
Pt here for capsule endoscopy. Pt completed prep without difficulty. Pt instructed on how to retrieve the capsule and return it. Pt verbalized understanding. Pt knows to call the office if she has not passed the capsule in 72 hours. Pt tolerated procedure well.   Lot # [id number] Exp:  12/28/2020

## 2019-12-29 ENCOUNTER — Other Ambulatory Visit: Payer: Self-pay | Admitting: Family Medicine

## 2019-12-29 DIAGNOSIS — F411 Generalized anxiety disorder: Secondary | ICD-10-CM

## 2019-12-29 DIAGNOSIS — F329 Major depressive disorder, single episode, unspecified: Secondary | ICD-10-CM

## 2019-12-31 ENCOUNTER — Telehealth: Payer: Self-pay | Admitting: Internal Medicine

## 2019-12-31 DIAGNOSIS — T189XXA Foreign body of alimentary tract, part unspecified, initial encounter: Secondary | ICD-10-CM

## 2019-12-31 NOTE — Telephone Encounter (Signed)
Patient reports that she has not seen the capsule in her stool.  She will try a dose of Miralax today.  If she has not seen it by Monday she will call back and arrange a KUB

## 2020-01-03 ENCOUNTER — Ambulatory Visit (INDEPENDENT_AMBULATORY_CARE_PROVIDER_SITE_OTHER)
Admission: RE | Admit: 2020-01-03 | Discharge: 2020-01-03 | Disposition: A | Discharge status: Home / Self Care | Payer: BC Managed Care – PPO | Source: Ambulatory Visit | Attending: Internal Medicine | Admitting: Internal Medicine

## 2020-01-03 ENCOUNTER — Other Ambulatory Visit: Payer: Self-pay

## 2020-01-03 DIAGNOSIS — T189XXA Foreign body of alimentary tract, part unspecified, initial encounter: Secondary | ICD-10-CM | POA: Diagnosis not present

## 2020-01-03 NOTE — Telephone Encounter (Signed)
Patient states she still has not passed capsule endo. She will come for a KUB this am.

## 2020-01-03 NOTE — Telephone Encounter (Signed)
Patient called to report that she still has not passed the capsule please advise.

## 2020-01-03 NOTE — Addendum Note (Signed)
Addended by: Marlon Pel on: 01/03/2020 09:14 AM   Modules accepted: Orders

## 2020-01-04 NOTE — Telephone Encounter (Signed)
Patient notified of the results and recommendations 

## 2020-01-04 NOTE — Telephone Encounter (Signed)
KUB showed it in sigmoid vs rectum  If still has not passed would try an enema or 2 dulcolax

## 2020-01-07 ENCOUNTER — Encounter: Payer: BC Managed Care – PPO | Admitting: Family Medicine

## 2020-01-07 ENCOUNTER — Ambulatory Visit (INDEPENDENT_AMBULATORY_CARE_PROVIDER_SITE_OTHER)
Admission: RE | Admit: 2020-01-07 | Discharge: 2020-01-07 | Disposition: A | Discharge status: Home / Self Care | Payer: BC Managed Care – PPO | Source: Ambulatory Visit | Attending: Internal Medicine | Admitting: Internal Medicine

## 2020-01-07 ENCOUNTER — Other Ambulatory Visit: Payer: Self-pay

## 2020-01-07 DIAGNOSIS — T189XXD Foreign body of alimentary tract, part unspecified, subsequent encounter: Secondary | ICD-10-CM | POA: Diagnosis not present

## 2020-01-07 DIAGNOSIS — T189XXA Foreign body of alimentary tract, part unspecified, initial encounter: Secondary | ICD-10-CM

## 2020-01-07 NOTE — Telephone Encounter (Signed)
Patient notified And new orders for KUB entered.  She will come today for x-ray

## 2020-01-10 NOTE — Telephone Encounter (Signed)
Please arrange for the flex sig.  Looks like she has had Pfizer vaccine but double check please

## 2020-01-10 NOTE — Telephone Encounter (Signed)
I called her and asked her to wait until we get the capsule endoscopy out and uploaded - before changing meds   Still w/ episodic AM nausea and vomiting  She needs to be set up for a flex sig in the [place] so that I may removed the capsule that is lodges in sigmoid colon  Hoping to get that done soon - next week at latest

## 2020-01-11 ENCOUNTER — Telehealth: Payer: Self-pay

## 2020-01-11 NOTE — Telephone Encounter (Signed)
Wants to have Flex Sigmoid scheduled sooner than 01-21-2020. Tells me she needs to "get this out of her"

## 2020-01-11 NOTE — Telephone Encounter (Signed)
Pt got 2nd covid vaccine 12/15/19. Previsit scheduled for 5/12@10am , flex sig scheduled in the [place] 01/14/20@1 :30pm. Pt aware of appt.

## 2020-01-11 NOTE — Telephone Encounter (Signed)
Please arrange for the flex sig.  Looks like she has had Pfizer vaccine but double check please      Documentation   Gatha Mayer, MD 16 hours ago (4:14 PM)     I called her and asked her to wait until we get the capsule endoscopy out and uploaded - before changing meds   Still w/ episodic AM nausea and vomiting  She needs to be set up for a flex sig in the [place] so that I may removed the capsule that is lodges in sigmoid colon  Hoping to get that done soon - next week at latest      Please schedule pt for flex sig in the [place] per Dr. Carlean Purl.

## 2020-01-12 ENCOUNTER — Encounter: Payer: Self-pay | Admitting: Internal Medicine

## 2020-01-12 ENCOUNTER — Other Ambulatory Visit: Payer: Self-pay

## 2020-01-12 ENCOUNTER — Ambulatory Visit (AMBULATORY_SURGERY_CENTER): Payer: Self-pay | Admitting: *Deleted

## 2020-01-12 VITALS — Temp 96.9°F | Ht 66.0 in | Wt 126.0 lb

## 2020-01-12 DIAGNOSIS — T189XXA Foreign body of alimentary tract, part unspecified, initial encounter: Secondary | ICD-10-CM

## 2020-01-12 NOTE — Progress Notes (Signed)
No egg or soy allergy known to patient  issues with past sedation with any surgeries  or procedures- of PONV , no intubation problems  No diet pills per patient No home 02 use per patient  No blood thinners per patient  Pt denies issues with constipation  No A fib or A flutter  EMMI video sent to pt's e mail   Due to the COVID-19 pandemic we are asking patients to follow these guidelines. Please only bring one care partner. Please be aware that your care partner may wait in the car in the parking lot or if they feel like they will be too hot to wait in the car, they may wait in the lobby on the 4th floor. All care partners are required to wear a mask the entire time (we do not have any that we can provide them), they need to practice social distancing, and we will do a Covid check for all patient's and care partners when you arrive. Also we will check their temperature and your temperature. If the care partner waits in their car they need to stay in the parking lot the entire time and we will call them on their cell phone when the patient is ready for discharge so they can bring the car to the front of the building. Also all patient's will need to wear a mask into building.  Pt completed covid 19 vaccines 12-15-2019

## 2020-01-14 ENCOUNTER — Encounter: Payer: Self-pay | Admitting: Internal Medicine

## 2020-01-14 ENCOUNTER — Other Ambulatory Visit: Payer: Self-pay

## 2020-01-14 ENCOUNTER — Ambulatory Visit (AMBULATORY_SURGERY_CENTER): Payer: BC Managed Care – PPO | Admitting: Internal Medicine

## 2020-01-14 ENCOUNTER — Telehealth: Payer: Self-pay

## 2020-01-14 ENCOUNTER — Ambulatory Visit (INDEPENDENT_AMBULATORY_CARE_PROVIDER_SITE_OTHER)
Admission: RE | Admit: 2020-01-14 | Discharge: 2020-01-14 | Disposition: A | Discharge status: Home / Self Care | Payer: BC Managed Care – PPO | Source: Ambulatory Visit | Attending: Internal Medicine | Admitting: Internal Medicine

## 2020-01-14 VITALS — BP 157/87 | HR 75 | Temp 96.8°F | Resp 12 | Ht 66.0 in | Wt 126.0 lb

## 2020-01-14 DIAGNOSIS — T189XXA Foreign body of alimentary tract, part unspecified, initial encounter: Secondary | ICD-10-CM

## 2020-01-14 DIAGNOSIS — R933 Abnormal findings on diagnostic imaging of other parts of digestive tract: Secondary | ICD-10-CM

## 2020-01-14 MED ORDER — SODIUM CHLORIDE 0.9 % IV SOLN: 500.0000 mL | Freq: Once | INTRAVENOUS | Status: DC

## 2020-01-14 NOTE — Progress Notes (Signed)
Report given to PACU, vss 

## 2020-01-14 NOTE — Telephone Encounter (Signed)
-----   Message from Gatha Mayer, MD sent at 01/14/2020  2:02 PM EDT ----- Regarding: needs CT Whomever sees this first please order a CT abd/pelvis without oral or iv contrast and try to get done by Monday - if can be done tonight or weekend that is great  Dx foreign body of small intestine (retained capsule cam)  Just finished sigmoidoscopy

## 2020-01-14 NOTE — Patient Instructions (Addendum)
The capsule is not in the colon.  Need to a CT scan next we will work on this and try to get it done by Monday.  I appreciate the opportunity to care for you. Gatha Mayer, MD, FACG  YOU HAD AN ENDOSCOPIC PROCEDURE TODAY AT [facility]:   Refer to the procedure report that was given to you for any specific questions about what was found during the examination.  If the procedure report does not answer your questions, please call your gastroenterologist to clarify.  If you requested that your care partner not be given the details of your procedure findings, then the procedure report has been included in a sealed envelope for you to review at your convenience later.  YOU SHOULD EXPECT: Some feelings of bloating in the abdomen. Passage of more gas than usual.  Walking can help get rid of the air that was put into your GI tract during the procedure and reduce the bloating. If you had a lower endoscopy (such as a colonoscopy or flexible sigmoidoscopy) you may notice spotting of blood in your stool or on the toilet paper. If you underwent a bowel prep for your procedure, you may not have a normal bowel movement for a few days.  Please Note:  You might notice some irritation and congestion in your nose or some drainage.  This is from the oxygen used during your procedure.  There is no need for concern and it should clear up in a day or so.  SYMPTOMS TO REPORT IMMEDIATELY:   Following lower endoscopy (colonoscopy or flexible sigmoidoscopy):  Excessive amounts of blood in the stool  Significant tenderness or worsening of abdominal pains  Swelling of the abdomen that is new, acute  Fever of 100F or higher  For urgent or emergent issues, a gastroenterologist can be reached at any hour by calling [phone]. Do not use MyChart messaging for urgent concerns.    DIET:  We do recommend a small meal at first, but then you may proceed to your regular diet.  Drink plenty of fluids but  you should avoid alcoholic beverages for 24 hours.  ACTIVITY:  You should plan to take it easy for the rest of today and you should NOT DRIVE or use heavy machinery until tomorrow (because of the sedation medicines used during the test).    FOLLOW UP: Our staff will call the number listed on your records 48-72 hours following your procedure to check on you and address any questions or concerns that you may have regarding the information given to you following your procedure. If we do not reach you, we will leave a message.  We will attempt to reach you two times.  During this call, we will ask if you have developed any symptoms of COVID 19. If you develop any symptoms (ie: fever, flu-like symptoms, shortness of breath, cough etc.) before then, please call [phone].  If you test positive for Covid 19 in the 2 weeks post procedure, please call and report this information to [place].    If any biopsies were taken you will be contacted by phone or by letter within the next 1-3 weeks.  Please call us at [phone] if you have not heard about the biopsies in 3 weeks.    SIGNATURES/CONFIDENTIALITY: You and/or your care partner have signed paperwork which will be entered into your electronic medical record.  These signatures attest to the fact that that the information above on your After Visit  Summary has been reviewed and is understood.  Full responsibility of the confidentiality of this discharge information lies with you and/or your care-partner. 

## 2020-01-14 NOTE — Telephone Encounter (Signed)
She will go straight over to the [facility] after leaving here to get her scan. They have informed her and her driver of this.

## 2020-01-14 NOTE — Progress Notes (Signed)
Vitals-CW Temp-JB  Pt's states no medical or surgical changes since previsit or office visit. 

## 2020-01-14 NOTE — Op Note (Addendum)
[facility] Patient Name: Cristina Conner Procedure Date: 01/14/2020 1:17 PM MRN: [id number] Endoscopist: Gatha Mayer , MD Age: 59 Referring MD:  Date of Birth: 1960-11-30 Gender: Female Account #: [id number] Procedure:                Flexible Sigmoidoscopy Indications:              Abnormal abdominal x-ray of the GI tract, retained                            capsule camera Medicines:                Propofol per Anesthesia, Monitored Anesthesia Care Procedure:                Pre-Anesthesia Assessment:                           - Prior to the procedure, a History and Physical                            was performed, and patient medications and                            allergies were reviewed. The patient's tolerance of                            previous anesthesia was also reviewed. The risks                            and benefits of the procedure and the sedation                            options and risks were discussed with the patient.                            All questions were answered, and informed consent                            was obtained. Prior Anticoagulants: The patient has                            taken no previous anticoagulant or antiplatelet                            agents. ASA Grade Assessment: II - A patient with                            mild systemic disease. After reviewing the risks                            and benefits, the patient was deemed in                            satisfactory condition to undergo the procedure.  After obtaining informed consent, the scope was                            passed under direct vision. The Colonoscope was                            introduced through the anus and advanced to the the                            left transverse colon. The flexible sigmoidoscopy                            was accomplished without difficulty. The patient                            tolerated  the procedure well. The quality of the                            bowel preparation was good. Scope In: Scope Out: Findings:                 The perianal and digital rectal examinations were                            normal.                           No capsule (foreign body) seen in the colon. Entire                            colon examined. Left colon good prep. Complications:            No immediate complications. Estimated Blood Loss:     Estimated blood loss: none. Impression:               - No specimens collected.                           - No retained capsule in colon Recommendation:           - Patient has a contact number available for                            emergencies. The signs and symptoms of potential                            delayed complications were discussed with the                            patient. Return to normal activities tomorrow.                            Written discharge instructions were provided to the                            patient.                           -  Resume previous diet.                           - Will order CT scan no oral and no IV contrast                           dx foreign body small bowel Gatha Mayer, MD 01/14/2020 2:02:07 PM This report has been signed electronically.

## 2020-01-18 ENCOUNTER — Emergency Department (HOSPITAL_COMMUNITY): Payer: BC Managed Care – PPO

## 2020-01-18 ENCOUNTER — Telehealth: Payer: Self-pay

## 2020-01-18 ENCOUNTER — Encounter (HOSPITAL_COMMUNITY): Payer: Self-pay

## 2020-01-18 ENCOUNTER — Other Ambulatory Visit: Payer: Self-pay

## 2020-01-18 ENCOUNTER — Inpatient Hospital Stay (HOSPITAL_COMMUNITY)
Admission: EM | Admit: 2020-01-18 | Discharge: 2020-02-03 | DRG: 025 | Disposition: A | Discharge status: Rehabilitation Facility | Payer: BC Managed Care – PPO | Attending: Neurological Surgery | Admitting: Neurological Surgery

## 2020-01-18 DIAGNOSIS — X58XXXA Exposure to other specified factors, initial encounter: Secondary | ICD-10-CM | POA: Diagnosis present

## 2020-01-18 DIAGNOSIS — E43 Unspecified severe protein-calorie malnutrition: Secondary | ICD-10-CM | POA: Insufficient documentation

## 2020-01-18 DIAGNOSIS — F329 Major depressive disorder, single episode, unspecified: Secondary | ICD-10-CM | POA: Diagnosis not present

## 2020-01-18 DIAGNOSIS — K56609 Unspecified intestinal obstruction, unspecified as to partial versus complete obstruction: Secondary | ICD-10-CM | POA: Diagnosis not present

## 2020-01-18 DIAGNOSIS — Z96642 Presence of left artificial hip joint: Secondary | ICD-10-CM | POA: Diagnosis present

## 2020-01-18 DIAGNOSIS — R1310 Dysphagia, unspecified: Secondary | ICD-10-CM | POA: Diagnosis not present

## 2020-01-18 DIAGNOSIS — H9192 Unspecified hearing loss, left ear: Secondary | ICD-10-CM | POA: Diagnosis present

## 2020-01-18 DIAGNOSIS — G9389 Other specified disorders of brain: Secondary | ICD-10-CM | POA: Diagnosis present

## 2020-01-18 DIAGNOSIS — G510 Bell's palsy: Secondary | ICD-10-CM | POA: Diagnosis present

## 2020-01-18 DIAGNOSIS — F418 Other specified anxiety disorders: Secondary | ICD-10-CM | POA: Diagnosis present

## 2020-01-18 DIAGNOSIS — Z833 Family history of diabetes mellitus: Secondary | ICD-10-CM | POA: Diagnosis not present

## 2020-01-18 DIAGNOSIS — R2689 Other abnormalities of gait and mobility: Secondary | ICD-10-CM | POA: Diagnosis not present

## 2020-01-18 DIAGNOSIS — M8580 Other specified disorders of bone density and structure, unspecified site: Secondary | ICD-10-CM | POA: Diagnosis present

## 2020-01-18 DIAGNOSIS — Z634 Disappearance and death of family member: Secondary | ICD-10-CM | POA: Diagnosis not present

## 2020-01-18 DIAGNOSIS — R109 Unspecified abdominal pain: Secondary | ICD-10-CM | POA: Diagnosis not present

## 2020-01-18 DIAGNOSIS — E8809 Other disorders of plasma-protein metabolism, not elsewhere classified: Secondary | ICD-10-CM | POA: Diagnosis not present

## 2020-01-18 DIAGNOSIS — R000 Tachycardia, unspecified: Secondary | ICD-10-CM | POA: Diagnosis present

## 2020-01-18 DIAGNOSIS — I10 Essential (primary) hypertension: Secondary | ICD-10-CM | POA: Diagnosis present

## 2020-01-18 DIAGNOSIS — E46 Unspecified protein-calorie malnutrition: Secondary | ICD-10-CM | POA: Diagnosis present

## 2020-01-18 DIAGNOSIS — Z20822 Contact with and (suspected) exposure to covid-19: Secondary | ICD-10-CM | POA: Diagnosis present

## 2020-01-18 DIAGNOSIS — T183XXA Foreign body in small intestine, initial encounter: Secondary | ICD-10-CM | POA: Diagnosis present

## 2020-01-18 DIAGNOSIS — D496 Neoplasm of unspecified behavior of brain: Secondary | ICD-10-CM | POA: Diagnosis present

## 2020-01-18 DIAGNOSIS — Z79899 Other long term (current) drug therapy: Secondary | ICD-10-CM | POA: Diagnosis not present

## 2020-01-18 DIAGNOSIS — Z8249 Family history of ischemic heart disease and other diseases of the circulatory system: Secondary | ICD-10-CM | POA: Diagnosis not present

## 2020-01-18 DIAGNOSIS — R634 Abnormal weight loss: Secondary | ICD-10-CM

## 2020-01-18 DIAGNOSIS — D333 Benign neoplasm of cranial nerves: Principal | ICD-10-CM | POA: Diagnosis present

## 2020-01-18 DIAGNOSIS — K5790 Diverticulosis of intestine, part unspecified, without perforation or abscess without bleeding: Secondary | ICD-10-CM | POA: Diagnosis present

## 2020-01-18 DIAGNOSIS — K5669 Other partial intestinal obstruction: Secondary | ICD-10-CM | POA: Diagnosis present

## 2020-01-18 DIAGNOSIS — Z681 Body mass index (BMI) 19 or less, adult: Secondary | ICD-10-CM | POA: Diagnosis not present

## 2020-01-18 DIAGNOSIS — G911 Obstructive hydrocephalus: Secondary | ICD-10-CM | POA: Diagnosis present

## 2020-01-18 DIAGNOSIS — D638 Anemia in other chronic diseases classified elsewhere: Secondary | ICD-10-CM

## 2020-01-18 DIAGNOSIS — G935 Compression of brain: Secondary | ICD-10-CM | POA: Diagnosis present

## 2020-01-18 DIAGNOSIS — E538 Deficiency of other specified B group vitamins: Secondary | ICD-10-CM

## 2020-01-18 DIAGNOSIS — D62 Acute posthemorrhagic anemia: Secondary | ICD-10-CM | POA: Diagnosis not present

## 2020-01-18 DIAGNOSIS — D649 Anemia, unspecified: Secondary | ICD-10-CM | POA: Diagnosis present

## 2020-01-18 DIAGNOSIS — R7303 Prediabetes: Secondary | ICD-10-CM | POA: Diagnosis present

## 2020-01-18 DIAGNOSIS — G8918 Other acute postprocedural pain: Secondary | ICD-10-CM | POA: Diagnosis not present

## 2020-01-18 DIAGNOSIS — G479 Sleep disorder, unspecified: Secondary | ICD-10-CM | POA: Diagnosis present

## 2020-01-18 DIAGNOSIS — K529 Noninfective gastroenteritis and colitis, unspecified: Secondary | ICD-10-CM

## 2020-01-18 LAB — CBC
HCT: 45.1 % (ref 36.0–46.0)
Hemoglobin: 13.9 g/dL (ref 12.0–15.0)
MCH: 24.9 pg — ABNORMAL LOW (ref 26.0–34.0)
MCHC: 30.8 g/dL (ref 30.0–36.0)
MCV: 80.7 fL (ref 80.0–100.0)
Platelets: 296 10*3/uL (ref 150–400)
RBC: 5.59 MIL/uL — ABNORMAL HIGH (ref 3.87–5.11)
RDW: 19.9 % — ABNORMAL HIGH (ref 11.5–15.5)
WBC: 10.3 10*3/uL (ref 4.0–10.5)
nRBC: 0 % (ref 0.0–0.2)

## 2020-01-18 LAB — LACTIC ACID, PLASMA: Lactic Acid, Venous: 1.7 mmol/L (ref 0.5–1.9)

## 2020-01-18 LAB — COMPREHENSIVE METABOLIC PANEL
ALT: 14 U/L (ref 0–44)
AST: 14 U/L — ABNORMAL LOW (ref 15–41)
Albumin: 4.4 g/dL (ref 3.5–5.0)
Alkaline Phosphatase: 64 U/L (ref 38–126)
Anion gap: 11 (ref 5–15)
BUN: 15 mg/dL (ref 6–20)
CO2: 25 mmol/L (ref 22–32)
Calcium: 9.9 mg/dL (ref 8.9–10.3)
Chloride: 104 mmol/L (ref 98–111)
Creatinine, Ser: 0.93 mg/dL (ref 0.44–1.00)
GFR calc Af Amer: >60 mL/min (ref >60)
GFR calc non Af Amer: >60 mL/min (ref >60)
Glucose, Bld: 139 mg/dL — ABNORMAL HIGH (ref 70–99)
Potassium: 3.4 mmol/L — ABNORMAL LOW (ref 3.5–5.1)
Sodium: 140 mmol/L (ref 135–145)
Total Bilirubin: 0.9 mg/dL (ref 0.3–1.2)
Total Protein: 7.4 g/dL (ref 6.5–8.1)

## 2020-01-18 LAB — SARS CORONAVIRUS 2 BY RT PCR (HOSPITAL ORDER, PERFORMED IN ~~LOC~~ HOSPITAL LAB): SARS Coronavirus 2: NEGATIVE

## 2020-01-18 LAB — LIPASE, BLOOD: Lipase: 33 U/L (ref 11–51)

## 2020-01-18 MED ORDER — ENOXAPARIN SODIUM 40 MG/0.4ML ~~LOC~~ SOLN
40.0000 mg | SUBCUTANEOUS | Status: DC
  Administered 2020-01-18 – 2020-01-23 (×6): 40 mg via SUBCUTANEOUS
  Filled 2020-01-18 (×6): qty 0.4

## 2020-01-18 MED ORDER — DIPHENHYDRAMINE HCL 25 MG PO CAPS: 25.0000 mg | ORAL_CAPSULE | Freq: Four times a day (QID) | ORAL | Status: DC | PRN

## 2020-01-18 MED ORDER — FENTANYL CITRATE (PF) 100 MCG/2ML IJ SOLN: 25.0000 ug | INTRAMUSCULAR | Status: DC | PRN

## 2020-01-18 MED ORDER — DIPHENHYDRAMINE HCL 50 MG/ML IJ SOLN: 25.0000 mg | Freq: Four times a day (QID) | INTRAMUSCULAR | Status: DC | PRN

## 2020-01-18 MED ORDER — ONDANSETRON 4 MG PO TBDP: 4.0000 mg | ORAL_TABLET | Freq: Four times a day (QID) | ORAL | Status: DC | PRN

## 2020-01-18 MED ORDER — POTASSIUM CHLORIDE IN NACL 20-0.9 MEQ/L-% IV SOLN
INTRAVENOUS | Status: DC
  Filled 2020-01-18 (×2): qty 1000

## 2020-01-18 MED ORDER — SODIUM CHLORIDE 0.9% FLUSH: 3.0000 mL | Freq: Once | INTRAVENOUS | Status: DC

## 2020-01-18 MED ORDER — MIRTAZAPINE 15 MG PO TABS
7.5000 mg | ORAL_TABLET | Freq: Every day | ORAL | Status: DC
  Administered 2020-01-18 – 2020-01-22 (×5): 7.5 mg via ORAL
  Filled 2020-01-18 (×5): qty 1

## 2020-01-18 MED ORDER — ONDANSETRON HCL 4 MG/2ML IJ SOLN
4.0000 mg | Freq: Four times a day (QID) | INTRAMUSCULAR | Status: DC | PRN
  Administered 2020-01-23: 4 mg via INTRAVENOUS
  Filled 2020-01-18: qty 2

## 2020-01-18 MED ORDER — MIRTAZAPINE 7.5 MG PO TABS: 7.5000 mg | ORAL_TABLET | Freq: Every day | ORAL | 1 refills | Status: DC

## 2020-01-18 NOTE — ED Provider Notes (Signed)
[facility] DEPT Provider Note   CSN: [id number] Arrival date & time: 01/18/20  1233     History Chief Complaint  Patient presents with  . Abdominal Pain  . Emesis  . Constipation    Cristina Conner is a 59 y.o. female.  59 year old female who presents with several weeks of nonbilious emesis along with abdominal discomfort.  Patient swallowed a Cologuard capsule and since that time is been able to pass it.  No fever chills.  Denies any urinary symptoms.  No diarrhea.  Has been seen by her GI doctor who has referred her to general surgeon for operative removal.        Past Medical History:  Diagnosis Date  . Anxiety   . Arthritis   . Diverticulosis   . GERD (gastroesophageal reflux disease)   . Hypertension   . Osteopenia   . Post-operative nausea and vomiting    vomiting after general anesthesia per pt.  . Pre-diabetes     Patient Active Problem List   Diagnosis Date Noted  . SBO (small bowel obstruction) ([place]) 01/18/2020  . Status post left hip replacement 09/07/2019  . 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 Primary localized osteoarthritis of left hip 08/30/19  . Death of family member-  husband passed 04/29/2019  . 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 Sleep difficulties 09/09/2018  . Pre-diabetes 03/09/2018  . Postmenopausal state- went thru early 40's 01/02/2018  . Primary osteoarthritis of left hip 01/02/2018  . Caregiver burden- for husband with ALLeukemia 01/02/2018  . Left hip pain 01/02/2018  . Osteopenia 07/31/2016  . Hypertension 05/20/2011  . Menopausal state 05/20/2011    Past Surgical History:  Procedure Laterality Date  . CESAREAN SECTION     x 2  . COLONOSCOPY    . ENDOMETRIAL ABLATION    . ESOPHAGOGASTRODUODENOSCOPY  10/2019  . PELVIC LAPAROSCOPY  1999   left salpingectomy, excision of Right, paratubal cyst  . PELVIC LAPAROSCOPY     laparoscopy with lysis of pelvic adhesions  . SHOULDER SURGERY  11/2010   "FROZEN  SHOULDER"  . TOTAL HIP ARTHROPLASTY Left 08/30/2019   Procedure: LEFT TOTAL HIP ARTHROPLASTY ANTERIOR APPROACH;  Surgeon: Melrose Nakayama, MD;  Location: [facility] ORS;  Service: Orthopedics;  Laterality: Left;  . TUBAL LIGATION    . UPPER GASTROINTESTINAL ENDOSCOPY       OB History    Gravida  2   Para  2   Term      Preterm      AB      Living  2     SAB      TAB      Ectopic      Multiple      Live Births              Family History  Problem Relation Age of Onset  . Hypertension Mother   . Heart disease Mother   . Diabetes Mother   . Hypertension Father   . Colon cancer Neg Hx   . Esophageal cancer Neg Hx   . Stomach cancer Neg Hx   . Rectal cancer Neg Hx   . Colon polyps Neg Hx     Social History   Tobacco Use  . Smoking status: Never Smoker  . Smokeless tobacco: Never Used  Substance Use Topics  . Alcohol use: Not Currently  . Drug use: No    Home Medications Prior to Admission medications   Medication Sig Start Date End  Date Taking? Authorizing Provider  acetaminophen (TYLENOL) 650 MG CR tablet Take 650 mg by mouth every 8 (eight) hours as needed for pain.    [provider]  dicyclomine (BENTYL) 10 MG capsule Take 1 capsule (10 mg total) by mouth 3 (three) times daily before meals. 11/29/19   Esterwood, Amy S, PA-C  eszopiclone (LUNESTA) 1 MG TABS tablet Take 1 mg by mouth at bedtime as needed for sleep.  10/15/19   [provider]  famotidine (PEPCID) 40 MG tablet PLEASE SEE ATTACHED FOR DETAILED DIRECTIONS 12/17/19   [provider]  Fe Fum-FePoly-Vit C-Vit B3 (INTEGRA) 62.5-62.5-40-3 MG CAPS Take 62.5 mg by mouth daily. Patient not taking: Reported on 01/12/2020 12/01/19   Esterwood, Amy S, PA-C  FLUoxetine (PROZAC) 10 MG tablet Take 1 tablet (10 mg total) by mouth daily. 12/29/19   Opalski, Neoma Laming, DO  mirtazapine (REMERON) 7.5 MG tablet Take 1 tablet (7.5 mg total) by mouth at bedtime. 01/18/20   Gatha Mayer, MD    ondansetron (ZOFRAN) 4 MG tablet Take 1 tablet (4 mg total) by mouth every 6 (six) hours as needed for nausea or vomiting. 11/29/19   Esterwood, Amy S, PA-C  ondansetron (ZOFRAN-ODT) 8 MG disintegrating tablet Take 1 tablet (8 mg total) by mouth every 8 (eight) hours as needed for nausea or refractory nausea / vomiting. 09/20/19   Mellody Dance, DO    Allergies    Hydrocodone  Review of Systems   Review of Systems  All other systems reviewed and are negative.   Physical Exam Updated Vital Signs BP (!) 172/110 (BP Location: Left Arm)   Pulse (!) 109   Temp 98 F (36.7 C) (Oral)   Resp 16   Ht 1.676 m (5\' 6" )   Wt 55.8 kg   LMP 05/01/2009   SpO2 100%   BMI 19.85 kg/m   Physical Exam Vitals and nursing note reviewed.  Constitutional:      General: She is not in acute distress.    Appearance: Normal appearance. She is well-developed. She is not toxic-appearing.  HENT:     Head: Normocephalic and atraumatic.  Eyes:     General: Lids are normal.     Conjunctiva/sclera: Conjunctivae normal.     Pupils: Pupils are equal, round, and reactive to light.  Neck:     Thyroid: No thyroid mass.     Trachea: No tracheal deviation.  Cardiovascular:     Rate and Rhythm: Normal rate and regular rhythm.     Heart sounds: Normal heart sounds. No murmur. No gallop.   Pulmonary:     Effort: Pulmonary effort is normal. No respiratory distress.     Breath sounds: Normal breath sounds. No stridor. No decreased breath sounds, wheezing, rhonchi or rales.  Abdominal:     General: Bowel sounds are normal. There is no distension.     Palpations: Abdomen is soft.     Tenderness: There is no abdominal tenderness. There is no rebound.  Musculoskeletal:        General: No tenderness. Normal range of motion.     Cervical back: Normal range of motion and neck supple.  Skin:    General: Skin is warm and dry.     Findings: No abrasion or rash.  Neurological:     Mental Status: She is alert and  oriented to person, place, and time.     GCS: GCS eye subscore is 4. GCS verbal subscore is 5. GCS motor subscore is 6.  Cranial Nerves: No cranial nerve deficit.     Sensory: No sensory deficit.  Psychiatric:        Speech: Speech normal.        Behavior: Behavior normal.     ED Results / Procedures / Treatments   Labs (all labs ordered are listed, but only abnormal results are displayed) Labs Reviewed  COMPREHENSIVE METABOLIC PANEL - Abnormal; Notable for the following components:      Result Value   Potassium 3.4 (*)    Glucose, Bld 139 (*)    AST 14 (*)    All other components within normal limits  CBC - Abnormal; Notable for the following components:   RBC 5.59 (*)    MCH 24.9 (*)    RDW 19.9 (*)    All other components within normal limits  SARS CORONAVIRUS 2 BY RT PCR (HOSPITAL ORDER, [facility])  LIPASE, BLOOD  LACTIC ACID, PLASMA  URINALYSIS, ROUTINE W REFLEX MICROSCOPIC    EKG None  Radiology DG Abdomen 1 View  Result Date: 01/18/2020 CLINICAL DATA:  The patient began pill endoscopy 2 weeks ago. EXAM: ABDOMEN - 1 VIEW COMPARISON:  KUB 01/07/2020.  CT abdomen and pelvis 01/14/2020. FINDINGS: Endoscopy capsule projecting in the pelvis just to the right of midline is again seen. The position is not notably changed. Bowel gas pattern is nonobstructive. No unexpected abdominal calcification or focal bony abnormality. IMPRESSION: Endoscopy capsule is essentially unchanged in position projecting in the pelvis just to the right of midline. Electronically Signed   By: Inge Rise M.D.   On: 01/18/2020 16:18    Procedures Procedures (including critical care time)  Medications Ordered in ED Medications  sodium chloride flush (NS) 0.9 % injection 3 mL (has no administration in time range)    ED Course  I have reviewed the triage vital signs and the nursing notes.  Pertinent labs & imaging results that were available during my care of  the patient were reviewed by me and considered in my medical decision making (see chart for details).    MDM Rules/Calculators/A&P                      Patient is x-ray shows retained endoscopy capsule.  Spoke with Dr. Ninfa Linden for general surgery will come to admit Final Clinical Impression(s) / ED Diagnoses Final diagnoses:  None    Rx / DC Orders ED Discharge Orders    None       Lacretia Leigh, MD 01/18/20 1902

## 2020-01-18 NOTE — ED Notes (Signed)
Pt given cup for UA

## 2020-01-18 NOTE — H&P (Signed)
Cristina Conner is an 59 y.o. female.   Chief Complaint: retained capsule endoscopy HPI: This is a 59 year old patient Dr. Silvano Rusk who is several weeks status post swallowing a capsule endoscopy. The capsule, unfortunate, has not passed. She has had intermittent nausea and vomiting which she was even having prior to the capsule. She had a previous CT scan in March prior to the endoscopy showing small bowel inflammation. On multiple plain abdominal x-rays the capsule remains in the pelvis. CT scan on 5/14 shows that the capsule is in the distal ileum. She has had a resolution of the inflammatory process in the small bowel in the interim. She reports that her pain is only mild. She has not had a bowel movement for several days but is passing flatus. She does feel weak and slightly lightheaded.  Past Medical History:  Diagnosis Date  . Anxiety   . Arthritis   . Diverticulosis   . GERD (gastroesophageal reflux disease)   . Hypertension   . Osteopenia   . Post-operative nausea and vomiting    vomiting after general anesthesia per pt.  . Pre-diabetes     Past Surgical History:  Procedure Laterality Date  . CESAREAN SECTION     x 2  . COLONOSCOPY    . ENDOMETRIAL ABLATION    . ESOPHAGOGASTRODUODENOSCOPY  10/2019  . PELVIC LAPAROSCOPY  1999   left salpingectomy, excision of Right, paratubal cyst  . PELVIC LAPAROSCOPY     laparoscopy with lysis of pelvic adhesions  . SHOULDER SURGERY  11/2010   "FROZEN SHOULDER"  . TOTAL HIP ARTHROPLASTY Left 08/17/2019   Procedure: LEFT TOTAL HIP ARTHROPLASTY ANTERIOR APPROACH;  Surgeon: Melrose Nakayama, MD;  Location: [facility];  Service: Orthopedics;  Laterality: Left;  . TUBAL LIGATION    . UPPER GASTROINTESTINAL ENDOSCOPY      Family History  Problem Relation Age of Onset  . Hypertension Mother   . Heart disease Mother   . Diabetes Mother   . Hypertension Father   . Colon cancer Neg Hx   . Esophageal cancer Neg Hx   . Stomach cancer Neg Hx    . Rectal cancer Neg Hx   . Colon polyps Neg Hx    Social History:  reports that she has never smoked. She has never used smokeless tobacco. She reports previous alcohol use. She reports that she does not use drugs.  Allergies:  Allergies  Allergen Reactions  . Hydrocodone Nausea And Vomiting    Medications Prior to Admission  Medication Sig Dispense Refill  . acetaminophen (TYLENOL) 650 MG CR tablet Take 650 mg by mouth every 8 (eight) hours as needed for pain.    Marland Kitchen dicyclomine (BENTYL) 10 MG capsule Take 1 capsule (10 mg total) by mouth 3 (three) times daily before meals. 90 capsule 3  . eszopiclone (LUNESTA) 1 MG TABS tablet Take 1 mg by mouth at bedtime as needed for sleep.     . famotidine (PEPCID) 40 MG tablet PLEASE SEE ATTACHED FOR DETAILED DIRECTIONS    . Fe Fum-FePoly-Vit C-Vit B3 (INTEGRA) 62.5-62.5-40-3 MG CAPS Take 62.5 mg by mouth daily. (Patient not taking: Reported on 01/12/2020) 30 capsule 6  . FLUoxetine (PROZAC) 10 MG tablet Take 1 tablet (10 mg total) by mouth daily. 90 tablet 0  . mirtazapine (REMERON) 7.5 MG tablet Take 1 tablet (7.5 mg total) by mouth at bedtime. 30 tablet 1  . ondansetron (ZOFRAN) 4 MG tablet Take 1 tablet (4 mg total) by mouth every  6 (six) hours as needed for nausea or vomiting. 50 tablet 4  . ondansetron (ZOFRAN-ODT) 8 MG disintegrating tablet Take 1 tablet (8 mg total) by mouth every 8 (eight) hours as needed for nausea or refractory nausea / vomiting. 20 tablet 0    Results for orders placed or performed during the hospital encounter of 01/18/20 (from the past 48 hour(s))  Lipase, blood     Status: None   Collection Time: 01/18/20  3:38 PM  Result Value Ref Range   Lipase 33 11 - 51 U/L    Comment: Performed at [facility], [place] [address] [place], [place] [address]  Comprehensive metabolic panel     Status: Abnormal   Collection Time: 01/18/20  3:38 PM  Result Value Ref Range   Sodium 140 135 - 145 mmol/L   Potassium  3.4 (L) 3.5 - 5.1 mmol/L   Chloride 104 98 - 111 mmol/L   CO2 25 22 - 32 mmol/L   Glucose, Bld 139 (H) 70 - 99 mg/dL    Comment: Glucose reference range applies only to samples taken after fasting for at least 8 hours.   BUN 15 6 - 20 mg/dL   Creatinine, Ser 0.93 0.44 - 1.00 mg/dL   Calcium 9.9 8.9 - 10.3 mg/dL   Total Protein 7.4 6.5 - 8.1 g/dL   Albumin 4.4 3.5 - 5.0 g/dL   AST 14 (L) 15 - 41 U/L   ALT 14 0 - 44 U/L   Alkaline Phosphatase 64 38 - 126 U/L   Total Bilirubin 0.9 0.3 - 1.2 mg/dL   GFR calc non Af Amer >60 >60 mL/min   GFR calc Af Amer >60 >60 mL/min   Anion gap 11 5 - 15    Comment: Performed at [facility], [place] [address] [place], [place] [address]  CBC     Status: Abnormal   Collection Time: 01/18/20  3:38 PM  Result Value Ref Range   WBC 10.3 4.0 - 10.5 K/uL   RBC 5.59 (H) 3.87 - 5.11 MIL/uL   Hemoglobin 13.9 12.0 - 15.0 g/dL   HCT 45.1 36.0 - 46.0 %   MCV 80.7 80.0 - 100.0 fL   MCH 24.9 (L) 26.0 - 34.0 pg   MCHC 30.8 30.0 - 36.0 g/dL   RDW 19.9 (H) 11.5 - 15.5 %   Platelets 296 150 - 400 K/uL   nRBC 0.0 0.0 - 0.2 %    Comment: Performed at [facility], [place] [address], [place], [place] [address]  Lactic acid, plasma     Status: None   Collection Time: 01/18/20  3:40 PM  Result Value Ref Range   Lactic Acid, Venous 1.7 0.5 - 1.9 mmol/L    Comment: Performed at [facility], [place] [address], [place], [place] [address]   DG Abdomen 1 View  Result Date: 01/18/2020 CLINICAL DATA:  The patient began pill endoscopy 2 weeks ago. EXAM: ABDOMEN - 1 VIEW COMPARISON:  KUB 01/07/2020.  CT abdomen and pelvis 01/14/2020. FINDINGS: Endoscopy capsule projecting in the pelvis just to the right of midline is again seen. The position is not notably changed. Bowel gas pattern is nonobstructive. No unexpected abdominal calcification or focal bony abnormality. IMPRESSION: Endoscopy capsule is essentially unchanged in  position projecting in the pelvis just to the right of midline. Electronically Signed   By: Inge Rise M.D.   On: 01/18/2020 16:18    Review of Systems  Constitutional: Negative for chills and fever.  Gastrointestinal: Positive for abdominal pain, constipation, nausea and vomiting. Negative for abdominal distention.  Genitourinary: Negative for dysuria.  All other systems reviewed and are negative.   Blood pressure (!) 162/95, pulse 96, temperature 98.3 F (36.8 C), temperature source Oral, resp. rate 18, height 5\' 6"  (1.676 m), weight 55.8 kg, last menstrual period 05/01/2009, SpO2 100 %. Physical Exam  Constitutional: She is oriented to person, place, and time. She appears well-developed and well-nourished. No distress.  HENT:  Head: Normocephalic and atraumatic.  Right Ear: External ear normal.  Left Ear: External ear normal.  Nose: Nose normal.  Mouth/Throat: Oropharynx is clear and moist. No oropharyngeal exudate.  Eyes: Pupils are equal, round, and reactive to light. Conjunctivae are normal. Right eye exhibits no discharge. Left eye exhibits no discharge. No scleral icterus.  Neck: No tracheal deviation present. No thyromegaly present.  Cardiovascular: Regular rhythm, normal heart sounds and intact distal pulses.  No murmur heard. Tachycardic  Respiratory: Effort normal and breath sounds normal. No respiratory distress. She has no wheezes.  GI: Soft. She exhibits no distension.  There is very minimal tenderness with him is no guarding in the lower abdomen  There is no hepatosplenomegaly  There are no obvious hernias  Musculoskeletal:        General: No tenderness, deformity or edema. Normal range of motion.     Cervical back: Normal range of motion and neck supple.  Lymphadenopathy:    She has no cervical adenopathy.  There is no inguinal adenopathy  Neurological: She is alert and oriented to person, place, and time.  Skin: Skin is warm and dry. No erythema. No  pallor.  Psychiatric: She has a normal mood and affect. Her behavior is normal. Judgment normal.     Assessment/Plan Retained capsule endoscopy with partial small bowel obstruction  At this point, the actual cause of her symptoms pre and post capsule are uncertain. It is uncertain whether this is adhesive disease or some type of stricture that is holding up the capsule. It does not appear to be moving out of the distal small bowel. I discussed this with her in detail. It will require surgical removal in the operating room. This would be attempted laparoscopically but given her previous surgery and pelvic adhesions there is a high likelihood of conversion to an open procedure. This also may require a small bowel resection. I have discussed this briefly with my partner Dr. Dema Severin will be the one performing the surgery tomorrow. The patient is agreement with the plan. She will be hydrated overnight and will remain n.p.o.  Coralie Keens, MD 01/18/2020, 8:22 PM

## 2020-01-18 NOTE — Telephone Encounter (Signed)
  Follow up Call-  Call back number 01/14/2020 12/13/2019 10/13/2019  Post procedure Call Back phone  # [phone]  Permission to leave phone message Yes Yes Yes  Some recent data might be hidden     Patient questions:  Do you have a fever, pain , or abdominal swelling? No. Pain Score  0 *  Have you tolerated food without any problems? No.  Have you been able to return to your normal activities? No.  Do you have any questions about your discharge instructions: Diet   Yes.   Medications  No. Follow up visit  Yes.    Do you have questions or concerns about your Care? Yes.    Actions: * If pain score is 4 or above: Physician/ provider Notified : Silvano Rusk, MD   1. Have you developed a fever since your procedure? no  2.   Have you had an respiratory symptoms (SOB or cough) since your procedure? no  3.   Have you tested positive for COVID 19 since your procedure no  4.   Have you had any family members/close contacts diagnosed with the COVID 19 since your procedure?  no   If yes to any of these questions please route to Joylene John, RN and Erenest Rasher, RN.

## 2020-01-18 NOTE — Progress Notes (Signed)
Report received from [facility] RN/ED.  Pt arrived to unit via stretcher and ambulated to bed with assist. Call bell education initiated and within reach of pt.

## 2020-01-18 NOTE — Telephone Encounter (Signed)
Spoke to [employer].  She continues to be nauseous all the time and is weak and lightheaded.  She vomits frequently.  In the past it was mainly in the mornings but now it is after most meals.  Explained that we have the referral in the [place] surgery regarding the retained capsule.  I explained how I am not sure that what is causing the capsule retention is related to her nausea and vomiting.  She does have weight loss but she is not eating well, she is trying to take nutritional supplements, but she also had evidence of malabsorption with a low normal B12 and a low folate and a low ferritin.  I am going to repeat those labs and regular labs with CMET and CBC.  She will come this week.  I explained how I thought a surgeon might remove the capsule either laparoscopically or with a mini laparotomy perhaps.  I told her it would really be up to the surgeon to determine how to approach that.  She seemed to understand that better.  I do believe there is an element of depression to this but I cannot say that the only issue.  Still trying to understand if there is some sort of inflammatory bowel disease in the small intestine.  That certainly possible.  She did have an ER visit where she had inflammation in the more proximal small bowel but a push enteroscopy did not detect any problems and I wonder, in retrospect if that was not a 1 off episode of a gastroenteritis.  If mirtazapine is not helpful consider metoclopramide.  Note I am aware of the potential interaction between mirtazapine and SSRI she is on a very low-dose SSRI and I am using low-dose mirtazapine and though recognized I think the risk of problems is acceptably low and given her situation it is important to try to treat her chronic nausea with chronic intermittent vomiting.

## 2020-01-18 NOTE — ED Triage Notes (Signed)
Patient states she had a pill/camera that she swallowed 2 weeks ago for a GI study and has not had a BM to get rid of it.  Patient c/o abdominal pain, emesis, and constipation.

## 2020-01-18 NOTE — Plan of Care (Signed)
POC initiated 

## 2020-01-19 ENCOUNTER — Inpatient Hospital Stay (HOSPITAL_COMMUNITY): Payer: BC Managed Care – PPO | Admitting: Certified Registered Nurse Anesthetist

## 2020-01-19 ENCOUNTER — Encounter (HOSPITAL_COMMUNITY): Payer: Self-pay | Admitting: Surgery

## 2020-01-19 ENCOUNTER — Encounter (HOSPITAL_COMMUNITY)
Admission: EM | Disposition: A | Discharge status: Rehabilitation Facility | Payer: Self-pay | Source: Home / Self Care | Attending: Neurological Surgery

## 2020-01-19 HISTORY — PX: LAPAROSCOPY: SHX197

## 2020-01-19 LAB — MRSA PCR SCREENING: MRSA by PCR: NEGATIVE

## 2020-01-19 LAB — HIV ANTIBODY (ROUTINE TESTING W REFLEX): HIV Screen 4th Generation wRfx: NONREACTIVE

## 2020-01-19 SURGERY — LAPAROSCOPY, DIAGNOSTIC
Anesthesia: General

## 2020-01-19 MED ORDER — DEXAMETHASONE SODIUM PHOSPHATE 10 MG/ML IJ SOLN
INTRAMUSCULAR | Status: DC | PRN
  Administered 2020-01-19: 10 mg via INTRAVENOUS

## 2020-01-19 MED ORDER — LACTATED RINGERS IV SOLN: INTRAVENOUS | Status: DC

## 2020-01-19 MED ORDER — ACETAMINOPHEN 10 MG/ML IV SOLN
INTRAVENOUS | Status: AC
  Filled 2020-01-19: qty 100

## 2020-01-19 MED ORDER — TRAMADOL HCL 50 MG PO TABS
50.0000 mg | ORAL_TABLET | Freq: Four times a day (QID) | ORAL | Status: DC | PRN
  Administered 2020-01-22 – 2020-01-24 (×2): 50 mg via ORAL
  Filled 2020-01-19 (×2): qty 1

## 2020-01-19 MED ORDER — ACETAMINOPHEN 10 MG/ML IV SOLN
INTRAVENOUS | Status: DC | PRN
Start: 2020-01-19 — End: 2020-01-19
  Administered 2020-01-19: 1000 mg via INTRAVENOUS

## 2020-01-19 MED ORDER — OXYCODONE HCL 5 MG PO TABS: 5.0000 mg | ORAL_TABLET | Freq: Once | ORAL | Status: DC | PRN

## 2020-01-19 MED ORDER — DEXAMETHASONE SODIUM PHOSPHATE 10 MG/ML IJ SOLN
INTRAMUSCULAR | Status: AC
  Filled 2020-01-19: qty 1

## 2020-01-19 MED ORDER — ONDANSETRON HCL 4 MG/2ML IJ SOLN
INTRAMUSCULAR | Status: AC
  Filled 2020-01-19: qty 2

## 2020-01-19 MED ORDER — OXYCODONE HCL 5 MG/5ML PO SOLN: 5.0000 mg | Freq: Once | ORAL | Status: DC | PRN

## 2020-01-19 MED ORDER — ROCURONIUM BROMIDE 10 MG/ML (PF) SYRINGE
PREFILLED_SYRINGE | INTRAVENOUS | Status: AC
  Filled 2020-01-19: qty 10

## 2020-01-19 MED ORDER — LACTATED RINGERS IV SOLN
INTRAVENOUS | Status: DC | PRN
Start: 2020-01-19 — End: 2020-01-19

## 2020-01-19 MED ORDER — FENTANYL CITRATE (PF) 250 MCG/5ML IJ SOLN
INTRAMUSCULAR | Status: AC
  Filled 2020-01-19: qty 5

## 2020-01-19 MED ORDER — SUCCINYLCHOLINE CHLORIDE 200 MG/10ML IV SOSY
PREFILLED_SYRINGE | INTRAVENOUS | Status: DC | PRN
  Administered 2020-01-19: 80 mg via INTRAVENOUS

## 2020-01-19 MED ORDER — IBUPROFEN 200 MG PO TABS
600.0000 mg | ORAL_TABLET | Freq: Four times a day (QID) | ORAL | Status: DC | PRN
  Administered 2020-01-23 – 2020-01-25 (×2): 600 mg via ORAL
  Filled 2020-01-19: qty 1
  Filled 2020-01-19: qty 3

## 2020-01-19 MED ORDER — LIDOCAINE 2% (20 MG/ML) 5 ML SYRINGE
INTRAMUSCULAR | Status: AC
  Filled 2020-01-19: qty 5

## 2020-01-19 MED ORDER — SODIUM CHLORIDE 0.9 % IV SOLN
2.0000 g | INTRAVENOUS | Status: AC
  Administered 2020-01-19: 2 g via INTRAVENOUS
  Filled 2020-01-19: qty 2

## 2020-01-19 MED ORDER — ACETAMINOPHEN 500 MG PO TABS
1000.0000 mg | ORAL_TABLET | Freq: Four times a day (QID) | ORAL | Status: DC
  Administered 2020-01-19 – 2020-01-26 (×22): 1000 mg via ORAL
  Filled 2020-01-19 (×26): qty 2

## 2020-01-19 MED ORDER — PROPOFOL 10 MG/ML IV BOLUS
INTRAVENOUS | Status: DC | PRN
  Administered 2020-01-19: 40 mg via INTRAVENOUS
  Administered 2020-01-19: 110 mg via INTRAVENOUS

## 2020-01-19 MED ORDER — 0.9 % SODIUM CHLORIDE (POUR BTL) OPTIME
TOPICAL | Status: DC | PRN
  Administered 2020-01-19: 1000 mL

## 2020-01-19 MED ORDER — SODIUM CHLORIDE (PF) 0.9 % IJ SOLN
INTRAMUSCULAR | Status: AC
  Filled 2020-01-19: qty 10

## 2020-01-19 MED ORDER — LIDOCAINE 2% (20 MG/ML) 5 ML SYRINGE
INTRAMUSCULAR | Status: DC | PRN
  Administered 2020-01-19: 60 mg via INTRAVENOUS

## 2020-01-19 MED ORDER — LABETALOL HCL 5 MG/ML IV SOLN
INTRAVENOUS | Status: AC
  Filled 2020-01-19: qty 4

## 2020-01-19 MED ORDER — FENTANYL CITRATE (PF) 100 MCG/2ML IJ SOLN: 25.0000 ug | INTRAMUSCULAR | Status: DC | PRN

## 2020-01-19 MED ORDER — LIDOCAINE HCL 2 % IJ SOLN
INTRAMUSCULAR | Status: AC
  Filled 2020-01-19: qty 20

## 2020-01-19 MED ORDER — LABETALOL HCL 5 MG/ML IV SOLN
10.0000 mg | Freq: Once | INTRAVENOUS | Status: AC
  Administered 2020-01-19: 10 mg via INTRAVENOUS

## 2020-01-19 MED ORDER — ONDANSETRON HCL 4 MG/2ML IJ SOLN: 4.0000 mg | Freq: Once | INTRAMUSCULAR | Status: DC | PRN

## 2020-01-19 MED ORDER — MIDAZOLAM HCL 2 MG/2ML IJ SOLN
INTRAMUSCULAR | Status: AC
  Filled 2020-01-19: qty 2

## 2020-01-19 MED ORDER — PROPOFOL 10 MG/ML IV BOLUS
INTRAVENOUS | Status: AC
  Filled 2020-01-19: qty 20

## 2020-01-19 MED ORDER — SCOPOLAMINE 1 MG/3DAYS TD PT72
MEDICATED_PATCH | TRANSDERMAL | Status: DC | PRN
  Administered 2020-01-19: 1 via TRANSDERMAL

## 2020-01-19 MED ORDER — SUCCINYLCHOLINE CHLORIDE 200 MG/10ML IV SOSY
PREFILLED_SYRINGE | INTRAVENOUS | Status: AC
  Filled 2020-01-19: qty 10

## 2020-01-19 MED ORDER — BUPIVACAINE HCL (PF) 0.25 % IJ SOLN
INTRAMUSCULAR | Status: DC | PRN
  Administered 2020-01-19: 20 mL

## 2020-01-19 MED ORDER — PHENYLEPHRINE 40 MCG/ML (10ML) SYRINGE FOR IV PUSH (FOR BLOOD PRESSURE SUPPORT)
PREFILLED_SYRINGE | INTRAVENOUS | Status: AC
  Filled 2020-01-19: qty 10

## 2020-01-19 MED ORDER — POTASSIUM CHLORIDE IN NACL 20-0.9 MEQ/L-% IV SOLN
INTRAVENOUS | Status: DC
  Filled 2020-01-19: qty 1000

## 2020-01-19 MED ORDER — ONDANSETRON HCL 4 MG/2ML IJ SOLN
INTRAMUSCULAR | Status: DC | PRN
  Administered 2020-01-19: 4 mg via INTRAVENOUS

## 2020-01-19 MED ORDER — HYDROMORPHONE HCL 1 MG/ML IJ SOLN: 0.5000 mg | INTRAMUSCULAR | Status: DC | PRN

## 2020-01-19 MED ORDER — SUGAMMADEX SODIUM 200 MG/2ML IV SOLN
INTRAVENOUS | Status: DC | PRN
  Administered 2020-01-19: 120 mg via INTRAVENOUS

## 2020-01-19 MED ORDER — BUPIVACAINE LIPOSOME 1.3 % IJ SUSP
20.0000 mL | Freq: Once | INTRAMUSCULAR | Status: AC
  Administered 2020-01-19: 20 mL
  Filled 2020-01-19: qty 20

## 2020-01-19 MED ORDER — FENTANYL CITRATE (PF) 250 MCG/5ML IJ SOLN
INTRAMUSCULAR | Status: DC | PRN
  Administered 2020-01-19 (×5): 50 ug via INTRAVENOUS

## 2020-01-19 MED ORDER — SCOPOLAMINE 1 MG/3DAYS TD PT72
MEDICATED_PATCH | TRANSDERMAL | Status: AC
  Filled 2020-01-19: qty 1

## 2020-01-19 MED ORDER — PHENYLEPHRINE 40 MCG/ML (10ML) SYRINGE FOR IV PUSH (FOR BLOOD PRESSURE SUPPORT)
PREFILLED_SYRINGE | INTRAVENOUS | Status: DC | PRN
  Administered 2020-01-19: 80 ug via INTRAVENOUS

## 2020-01-19 MED ORDER — MIDAZOLAM HCL 2 MG/2ML IJ SOLN
INTRAMUSCULAR | Status: DC | PRN
  Administered 2020-01-19: 2 mg via INTRAVENOUS

## 2020-01-19 MED ORDER — ROCURONIUM BROMIDE 10 MG/ML (PF) SYRINGE
PREFILLED_SYRINGE | INTRAVENOUS | Status: DC | PRN
  Administered 2020-01-19: 30 mg via INTRAVENOUS
  Administered 2020-01-19 (×2): 10 mg via INTRAVENOUS

## 2020-01-19 MED ORDER — LIDOCAINE 2% (20 MG/ML) 5 ML SYRINGE
INTRAMUSCULAR | Status: DC | PRN
Start: 2020-01-19 — End: 2020-01-19
  Administered 2020-01-19: 1.5 mg/kg/h via INTRAVENOUS

## 2020-01-19 MED ORDER — BUPIVACAINE HCL 0.25 % IJ SOLN
INTRAMUSCULAR | Status: AC
  Filled 2020-01-19: qty 1

## 2020-01-19 SURGICAL SUPPLY — 64 items
ADH SKN CLS APL DERMABOND .7 (GAUZE/BANDAGES/DRESSINGS) ×1
APPLIER CLIP 5 13 M/L LIGAMAX5 (MISCELLANEOUS)
APPLIER CLIP ROT 10 11.4 M/L (STAPLE)
APR CLP MED LRG 11.4X10 (STAPLE)
APR CLP MED LRG 5 ANG JAW (MISCELLANEOUS)
BAG SPEC RTRVL 10 TROC 200 (ENDOMECHANICALS)
BAG SPEC RTRVL LRG 6X4 10 (ENDOMECHANICALS)
BLADE EXTENDED COATED 6.5IN (ELECTRODE) IMPLANT
CABLE HIGH FREQUENCY MONO STRZ (ELECTRODE) IMPLANT
CELLS DAT CNTRL 66122 CELL SVR (MISCELLANEOUS) ×1 IMPLANT
CLIP APPLIE 5 13 M/L LIGAMAX5 (MISCELLANEOUS) IMPLANT
CLIP APPLIE ROT 10 11.4 M/L (STAPLE) IMPLANT
CNTNR URN SCR LID CUP LEK RST (MISCELLANEOUS) IMPLANT
CONT SPEC 4OZ STRL OR WHT (MISCELLANEOUS) ×2
COVER MAYO STAND STRL (DRAPES) IMPLANT
COVER WAND RF STERILE (DRAPES) IMPLANT
DECANTER SPIKE VIAL GLASS SM (MISCELLANEOUS) ×2 IMPLANT
DERMABOND ADVANCED (GAUZE/BANDAGES/DRESSINGS) ×1
DERMABOND ADVANCED .7 DNX12 (GAUZE/BANDAGES/DRESSINGS) IMPLANT
DRSG OPSITE POSTOP 3X4 (GAUZE/BANDAGES/DRESSINGS) ×1 IMPLANT
ELECT REM PT RETURN 15FT ADLT (MISCELLANEOUS) ×2 IMPLANT
GAUZE SPONGE 4X4 12PLY STRL (GAUZE/BANDAGES/DRESSINGS) ×2 IMPLANT
GLOVE BIO SURGEON STRL SZ7.5 (GLOVE) ×2 IMPLANT
GLOVE INDICATOR 8.0 STRL GRN (GLOVE) ×4 IMPLANT
GOWN STRL REUS W/TWL XL LVL3 (GOWN DISPOSABLE) ×4 IMPLANT
HANDLE SUCTION POOLE (INSTRUMENTS) IMPLANT
KIT TURNOVER KIT A (KITS) IMPLANT
LIGASURE IMPACT 36 18CM CVD LR (INSTRUMENTS) ×1 IMPLANT
POUCH RETRIEVAL ECOSAC 10 (ENDOMECHANICALS) IMPLANT
POUCH RETRIEVAL ECOSAC 10MM (ENDOMECHANICALS)
POUCH SPECIMEN RETRIEVAL 10MM (ENDOMECHANICALS) IMPLANT
RELOAD PROXIMATE 75MM BLUE (ENDOMECHANICALS) ×4 IMPLANT
RELOAD STAPLE 75 3.8 BLU REG (ENDOMECHANICALS) IMPLANT
RETRACTOR WND ALEXIS 18 MED (MISCELLANEOUS) IMPLANT
RTRCTR WOUND ALEXIS 18CM MED (MISCELLANEOUS) ×2
SEALER TISSUE G2 STRG ARTC 35C (ENDOMECHANICALS) IMPLANT
SET IRRIG TUBING LAPAROSCOPIC (IRRIGATION / IRRIGATOR) ×2 IMPLANT
SLEEVE ADV FIXATION 5X100MM (TROCAR) ×2 IMPLANT
SPONGE LAP 18X18 RF (DISPOSABLE) IMPLANT
STAPLER 90 3.5 STAND SLIM (STAPLE) ×2
STAPLER 90 3.5 STD SLIM (STAPLE) IMPLANT
STAPLER GUN LINEAR PROX 60 (STAPLE) ×1 IMPLANT
STAPLER PROXIMATE 75MM BLUE (STAPLE) ×1 IMPLANT
SUCTION POOLE HANDLE (INSTRUMENTS) ×4
SUT MNCRL AB 4-0 PS2 18 (SUTURE) ×2 IMPLANT
SUT PDS AB 1 TP1 54 (SUTURE) ×2 IMPLANT
SUT PDS AB 1 TP1 96 (SUTURE) IMPLANT
SUT SILK 2 0 (SUTURE)
SUT SILK 2 0 SH CR/8 (SUTURE) IMPLANT
SUT SILK 2-0 18XBRD TIE 12 (SUTURE) IMPLANT
SUT SILK 3 0 (SUTURE)
SUT SILK 3 0 SH CR/8 (SUTURE) ×1 IMPLANT
SUT SILK 3-0 18XBRD TIE 12 (SUTURE) IMPLANT
SUT VIC AB 2-0 CT1 36 (SUTURE) ×1 IMPLANT
SUT VIC AB 3-0 SH 27 (SUTURE) ×2
SUT VIC AB 3-0 SH 27XBRD (SUTURE) IMPLANT
TOWEL OR 17X26 10 PK STRL BLUE (TOWEL DISPOSABLE) ×2 IMPLANT
TOWEL OR NON WOVEN STRL DISP B (DISPOSABLE) ×2 IMPLANT
TRAY FOLEY MTR SLVR 16FR STAT (SET/KITS/TRAYS/PACK) ×2 IMPLANT
TRAY LAPAROSCOPIC (CUSTOM PROCEDURE TRAY) ×2 IMPLANT
TROCAR ADV FIXATION 12X100MM (TROCAR) IMPLANT
TROCAR ADV FIXATION 5X100MM (TROCAR) ×2 IMPLANT
TROCAR BLADELESS OPT 5 100 (ENDOMECHANICALS) ×2 IMPLANT
TROCAR XCEL BLUNT TIP 100MML (ENDOMECHANICALS) IMPLANT

## 2020-01-19 NOTE — Op Note (Addendum)
01/18/2020 - 01/19/2020  4:36 PM  PATIENT:  Cristina Conner  59 y.o. female  Patient Care Team: Lorrene Reid, PA-C as PCP - General (Physician Assistant) Druscilla Brownie, MD as Consulting Physician (Dermatology) Opthamology, Cristina Conner (Ophthalmology) Princess Bruins, MD as Consulting Physician (Obstetrics and Gynecology) Melrose Nakayama, MD as Consulting Physician (Orthopedic Surgery)  PRE-OPERATIVE DIAGNOSIS:  Retained capsule endoscope  POST-OPERATIVE DIAGNOSIS:   1. Retained capsule endoscope in mid ileum 2. 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 Laparoscopic-assisted small bowel resection 2. Diagnostic laparoscopy 3. Bilateral transversus abdominus plane blocks  SURGEON:  Luna Mt. Dema Severin, MD  ASSISTANT: Barkley Boards, PA-C  ANESTHESIA:   local and general  COUNTS:  Sponge, needle and instrument counts were reported correct x2 at the conclusion of the operation.  EBL: 10 mL  DRAINS: None  SPECIMEN:  1. Segment of ileum 2. Capsule endoscope  COMPLICATIONS: None  FINDINGS: Retained capsule in mid ileum.  Laparoscopically, the small bowel was run from the ligament of Treitz to the ileocecal valve and there is no obvious external pathology.  The mid ileum did show a small area with a focal indentation that was quite subtle.  Laparoscopically, the capsule was not clearly palpable.  The cecum, ascending, transverse, intraperitoneal portion of the descending, and sigmoid were grossly normal in appearance as well.  The stomach was grossly normal in appearance.  There were no significant adhesions in the pelvis.  There were no masses evident in the pelvis.  A small periumbilical midline incision was created and the segment was eviscerated and the capsule confirmed to be in this location.  Small bowel resection was carried out.  This was opened on the back table and found to contain the capsule and a benign-appearing short segment intrinsic  stricture with normal-appearing mucosa.  There is no mesenteric adenopathy that was appreciable.  DISPOSITION: PACU in satisfactory condition  INDICATION: Cristina Conner is a very pleasant 82yoF with history of swallowing a capsule endoscope.  The capsule did not pass.  This was being done for evaluation of some abnormal findings noted on a CAT scan back in March.  There was some small bowel inflammation that had subsequently resolved.  Multiple plain film x-rays demonstrated the capsule to be in an unchanged position in the pelvis.  CT scan 01/14/2020 demonstrated the capsule in the distal ileum.  There is no ongoing inflammatory process in the small bowel.  She is not having any obstructive type symptoms and has been doing reasonably well but the capsule had failed to progress or passed.  She had been admitted to the hospital.  We discussed options going forward and given the duration of time, discussed proceeding with surgery.  She opted to pursue this.  Please refer to notes elsewhere for details regarding this discussion.  DESCRIPTION: The patient was identified in preop holding and taken to the OR where she was placed on the operating room table. SCDs were placed. General endotracheal anesthesia was induced without difficulty. Hair on the abdomen was clipped. A foley catheter was placed under sterile conditions.  The left arm was tucked.  Pressure points were then padded.  She was then prepped and draped in the usual sterile fashion. A surgical timeout was performed indicating the correct patient, procedure, positioning and need for preoperative antibiotics.   An OG tube had been placed by anesthesia and was confirmed to be to suction.  At Palmer's point, a stab incision was created and a Veress needle introduced into  the peritoneal cavity on the first attempt.  Intraperitoneal location was confirmed with the aspiration and saline drop test.  Pneumoperitoneum established to a maximum pressure of 15 mmHg  with CO2.  At Palmer's point, a 5 mm Optiview trocar was placed under direct visualization.  The laparoscope was inserted and intraperitoneal inspection demonstrated no evidence of trocar site or Veress needle complication.  Bilateral transversus abdominis plane blocks were then created with a dilute mixture of Exparel and 0.25% Marcaine.  2 additional 5 mm trochars were placed-one in the supraumbilical position and the other in the left lower quadrant - both under direct visualization.  The abdomen was surveyed.  There were no significant adhesions to the abdominal wall or in her pelvis.  She was positioned in Trendelenburg.  The stomach was normal in appearance.  The cecum, ascending, transverse, intraperitoneal portion of descending, and sigmoid colon were normal in appearance.  The omentum was normal in appearance.  The pelvis had no apparent masses or abnormalities.  Small bowel was then run from the ileocecal valve to the ligament of Treitz.  This was run twice.  A very subtle area of dimpling on the mid ileum was seen.  The capsule endoscope could not be clearly palpated but we suspected to be at this location.  A soft bowel clamp was placed.  A periumbilical midline incision was created and the subcutaneous tissue was incised electrocautery.  The fascia was incised.  A small Alexis wound protector was placed.  The segment of small bowel was delivered onto the field and found to contain the capsule endoscope.  The lumen was grossly normal by palpation.  There is no palpable mass.  The mesentery was normal in appearance and there is no significant adenopathy.  The bowel proximal and distal to this was all run. There were no palpable abnormalities or narrowing. At the segment where there stricture was, there was palpable crepitus in the bowel wall but no evident ischemia or concerning appearing features. Given that this capsule was clearly obstructed at this location and I could not advance it distally, we  turned our attention to performing a segmental small bowel resection.  Windows were created in the mesentery.  Each limb proximal distal to the problematic segment was then divided using a GIA blue load stapler.  The intervening mesentery was divided using a LigaSure.  This was hemostatic.  The specimen was passed off.  The antimesenteric border of each respective staple line was cut.  An anastomosis was then fashioned-side-to-side functional end-to-end using a 75 mm GIA blue load stapler after confirming orientation such that there was no twisting of the bowel or mesentery.  The anastomosis was inspected and noted to be hemostatic.  The common enterotomy was then closed with a firing of a TA 90 blue load stapler.  The corners of each TA staple line were then dunked using 3-0 silk.  A 3-0 silk suture was placed at the apex of the anastomosis.  The mesenteric defect was then approximated using a running 3-0 Vicryl suture.  The anastomosis was palpated and noted to be widely patent, 3 fingerbreadths.  This was hemostatic.  This was placed back in the abdomen.  I went to the back table and examined the specimen.  I open the proximal end of the staple line and was able to get the capsule endoscope out.  There was a very small intrinsic appearing short segment stricture in the ileum with no evident small bowel mass. Gown/gloves were then  changed. The abdomen was irrigated.  The Sumner wound protector was removed.  Attention was turned to closing.  The fascia was closed using 2 running #1 PDS sutures.  Sponge, needle, and instrument counts were reported correct x2.  The skin of all incision sites was closed with a 4-0 Monocryl subcuticular suture.  Dermabond was applied over the incisions.  A honeycomb was also placed over the midline.  The Foley catheter was removed.  She was awakened from anesthesia, extubated, and transferred to stretcher for transport to PACU in satisfactory condition.  ADDENDUM: I spoke with  Dr. Lyndon Code with pathology regarding all of this and to not discard the capsule but keep it on hold for Dr. Carlean Purl.  We discussed that the capsule would be sent in a separate container with the specimen.  Dr. Arelia Longest has intentions to pick up the capsule to mail it back to the company for image retrieval.  I have let Dr. Carlean Purl know that the capsule will be in pathology.

## 2020-01-19 NOTE — Anesthesia Postprocedure Evaluation (Signed)
Anesthesia Post Note  Patient: Cristina Conner  Procedure(s) Performed: LAPAROSCOPY DIAGNOSTIC LAPAROTOMY WITH SMALL BOWEL RESECTION (N/A )     Patient location during evaluation: PACU Anesthesia Type: General Level of consciousness: awake and alert Pain management: pain level controlled Vital Signs Assessment: post-procedure vital signs reviewed and stable Respiratory status: spontaneous breathing, nonlabored ventilation and respiratory function stable Cardiovascular status: blood pressure returned to baseline and stable Postop Assessment: no apparent nausea or vomiting Anesthetic complications: no    Last Vitals:  Vitals:   01/19/20 1800 01/19/20 1827  BP: (!) 153/98 (!) 139/96  Pulse: 98 94  Resp: 12 12  Temp: 36.9 C 37.2 C  SpO2: 97% 98%    Last Pain:  Vitals:   01/19/20 1827  TempSrc: Oral  PainSc:                  Lidia Collum

## 2020-01-19 NOTE — Anesthesia Preprocedure Evaluation (Addendum)
Anesthesia Evaluation  Patient identified by MRN, date of birth, ID band Patient awake    Reviewed: Allergy & Precautions, NPO status , Patient's Chart, lab work & pertinent test results  History of Anesthesia Complications (+) PONV and history of anesthetic complications  Airway Mallampati: II  TM Distance: >3 FB Neck ROM: Full    Dental  (+) Missing, Chipped, Poor Dentition,    Pulmonary neg pulmonary ROS,    Pulmonary exam normal        Cardiovascular hypertension, Normal cardiovascular exam  ECG: NSR rate 97   Neuro/Psych PSYCHIATRIC DISORDERS Anxiety Depression negative neurological ROS     GI/Hepatic Neg liver ROS, GERD  Medicated and Controlled,SBO   Endo/Other  negative endocrine ROS  Renal/GU negative Renal ROS     Musculoskeletal negative musculoskeletal ROS (+)   Abdominal   Peds  Hematology negative hematology ROS (+)   Anesthesia Other Findings small bowel obstruction  Reproductive/Obstetrics S/p BTL                           Anesthesia Physical Anesthesia Plan  ASA: II  Anesthesia Plan: General   Post-op Pain Management:    Induction: Intravenous, Rapid sequence and Cricoid pressure planned  PONV Risk Score and Plan: 4 or greater and Treatment may vary due to age or medical condition, Ondansetron, Dexamethasone, Midazolam and Scopolamine patch - Pre-op  Airway Management Planned: Oral ETT  Additional Equipment: None  Intra-op Plan:   Post-operative Plan: Extubation in OR  Informed Consent: I have reviewed the patients History and Physical, chart, labs and discussed the procedure including the risks, benefits and alternatives for the proposed anesthesia with the patient or authorized representative who has indicated his/her understanding and acceptance.     Dental advisory given  Plan Discussed with: CRNA  Anesthesia Plan Comments:         Anesthesia  Quick Evaluation

## 2020-01-19 NOTE — Anesthesia Procedure Notes (Signed)
Procedure Name: Intubation Date/Time: 01/19/2020 2:49 PM Performed by: Sharlette Dense, CRNA Patient Re-evaluated:Patient Re-evaluated prior to induction Oxygen Delivery Method: Circle system utilized Preoxygenation: Pre-oxygenation with 100% oxygen Induction Type: IV induction, Rapid sequence and Cricoid Pressure applied Ventilation: Mask ventilation without difficulty Laryngoscope Size: Miller and 2 Grade View: Grade I Tube type: Oral Tube size: 7.0 mm Number of attempts: 1 Airway Equipment and Method: Stylet Placement Confirmation: ETT inserted through vocal cords under direct vision,  positive ETCO2 and breath sounds checked- equal and bilateral Secured at: 20 cm Tube secured with: Tape Dental Injury: Teeth and Oropharynx as per pre-operative assessment

## 2020-01-19 NOTE — Transfer of Care (Signed)
Immediate Anesthesia Transfer of Care Note  Patient: Cristina Conner  Procedure(s) Performed: LAPAROSCOPY DIAGNOSTIC POSSIBLE LAPAROTOMY WITH SMALL BOWEL RESECTION (N/A )  Patient Location: PACU  Anesthesia Type:General  Level of Consciousness: drowsy  Airway & Oxygen Therapy: Patient Spontanous Breathing and Patient connected to face mask oxygen  Post-op Assessment: Report given to RN and Post -op Vital signs reviewed and stable  Post vital signs: Reviewed and stable  Last Vitals:  Vitals Value Taken Time  BP 199/111 01/19/20 1650  Temp    Pulse 112 01/19/20 1651  Resp 9 01/19/20 1651  SpO2 100 % 01/19/20 1651  Vitals shown include unvalidated device data.  Last Pain:  Vitals:   01/19/20 1401  TempSrc: Oral  PainSc:       Patients Stated Pain Goal: 3 ([id number])  Complications: No apparent anesthesia complications

## 2020-01-19 NOTE — Progress Notes (Signed)
Subjective No acute events. No significant pain, nausea or emesis  Objective: Vital signs in last 24 hours: Temp:  [97.8 F (36.6 C)-99.5 F (37.5 C)] 99.5 F (37.5 C) (05/19 0957) Pulse Rate:  [80-112] 89 (05/19 0957) Resp:  [16-18] 16 (05/19 0957) BP: (151-172)/(85-110) 164/85 (05/19 0957) SpO2:  [97 %-100 %] 100 % (05/19 0957) Weight:  [55.8 kg] 55.8 kg (05/18 2006) Last BM Date: 01/10/20  Intake/Output from previous day: 05/18 0701 - 05/19 0700 In: 910.5 [I.V.:910.5] Out: 700 [Urine:700] Intake/Output this shift: Total I/O In: 0  Out: 200 [Urine:200]  Gen: NAD, comfortable CV: RRR Pulm: Normal work of breathing Abd: Soft, NT/ND Ext: SCDs in place  Lab Results: CBC  Recent Labs    01/18/20 1538  WBC 10.3  HGB 13.9  HCT 45.1  PLT 296   BMET Recent Labs    01/18/20 1538  NA 140  K 3.4*  CL 104  CO2 25  GLUCOSE 139*  BUN 15  CREATININE 0.93  CALCIUM 9.9   PT/INR No results for input(s): LABPROT, INR in the last 72 hours. ABG No results for input(s): PHART, HCO3 in the last 72 hours.  Invalid input(s): PCO2, PO2  Studies/Results:  Anti-infectives: Anti-infectives (From admission, onward)   None       Assessment/Plan: Patient Active Problem List   Diagnosis Date Noted  . SBO (small bowel obstruction) ([place]) 01/18/2020  . Status post left hip replacement 09/07/2019  . 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 Primary localized osteoarthritis of left hip 08/27/19  . Death of family member-  husband passed 04/29/2019  . 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 Sleep difficulties 09/09/2018  . Pre-diabetes 03/09/2018  . Postmenopausal state- went thru early 40's 01/02/2018  . Primary osteoarthritis of left hip 01/02/2018  . Caregiver burden- for husband with ALLeukemia 01/02/2018  . Left hip pain 01/02/2018  . Osteopenia 07/31/2016  . Hypertension 05/20/2011  . Menopausal state 05/20/2011   Ms. Herrle is a very pleasant 53yoF with retained capsule  endoscope - admitted from ED for this reason - no evident obstruction on imaging per se  -The anatomy and physiology of the GI tract was discussed at length with the patient. The pathophysiology of bowel obstructions/adhesions/masses was discussed at length as well -This has remained in situ for some time now and not progressing. We discussed long-term risks of obstruction or erosion and therefore surgery to address. We discussed diagnostic laparoscopy, lysis of adhesions, possible laparotomy, probable bowel resection. -The planned procedures, material risks (including, but not limited to, pain, bleeding, infection, scarring, need for blood transfusion, damage to surrounding structures- blood vessels/nerves/viscus/organs, leak from anastomosis, need for additional procedures, medication reactions, worsening of pre-existing medical conditions, hernia, failure to identify the capsule or it passing spontaneously on mobilization of bowel, pneumonia, heart attack, stroke, death) benefits and alternatives to surgery were discussed at length. We discussed expectations and that many of her GI symptoms that were noted prior may certainly persist despite this surgery - that the main goal of this surgery is to address the issue of a retained capsule and not necessarily the more functional GI related issues. The patient's questions were answered to her satisfaction, she voiced understanding and elected to proceed with surgery. Additionally, we discussed typical postoperative expectations and the recovery process.   LOS: 1 day   [place] Dema Severin, M.D. [facility] Use [email or web address] to contact on call provider

## 2020-01-20 DIAGNOSIS — K56609 Unspecified intestinal obstruction, unspecified as to partial versus complete obstruction: Secondary | ICD-10-CM

## 2020-01-20 NOTE — Progress Notes (Signed)
  Courtesy note   I appreciate the excellent surgical care of Ms. Thibault.  She seems to be recovering well so far.   Her daughter was asking about physical therapy and I said I would defer to surgery.  She was very weak and not walking much for some reason at home for reasons that are not clear to me.  She has had this longstanding issue with nausea and vomiting and weight loss.  Its been going on since December 2020 approximately.  It has defied explanation with EGD, colonoscopy, labs, imaging.  She did have an episode of inflammatory enteritis on the CT scan that I think was transient at this point.   We will get the capsule uploaded and review that.  I am ordering some labs that I was planning to get an follow-up as an outpatient.  This will be B12 ferritin and folate levels.  B12 was only 233 and folate was 5.7 which was slightly low back in March so when to follow-up on that, and ferritin was 5.5.  Fortunately she is not anemic.  This does suggest some possible malabsorption syndrome.  Previous small bowel biopsies on enteroscopy have been normal.  Recheck TSH as well.  Even though the biopsies were normal I am going to [place] check celiac antibodies.  Her overall scenario has been a mystery, maybe the small bowel stricture in the ileum is the source and she will recover completely, hopefully.  In addition to PT consult when you feel appropriate I would appreciate dietitian eval to help with diet recommendations for supplementation given her weight loss.  Thanks again.  Gatha Mayer, MD, [facility] 01/20/2020 5:19 PM

## 2020-01-20 NOTE — Progress Notes (Signed)
Patient ID: Cristina Conner, female   DOB: 1961/04/21, 59 y.o.   MRN: [id number]    1 Day Post-Op  Subjective: Feeling better, but no flatus yet.  Tolerating clear liquids.  Voiding well.  Wanting to know when she can go home.  ROS: See above, otherwise other systems negative  Objective: Vital signs in last 24 hours: Temp:  [97.5 F (36.4 C)-99.5 F (37.5 C)] 98.7 F (37.1 C) (05/20 0931) Pulse Rate:  [66-124] 86 (05/20 0931) Resp:  [10-18] 18 (05/20 0601) BP: (126-199)/(76-114) 157/82 (05/20 0931) SpO2:  [96 %-100 %] 100 % (05/20 0931) Last BM Date: 01/10/20  Intake/Output from previous day: 05/19 0701 - 05/20 0700 In: 3931.2 [P.O.:120; I.V.:3611.2; IV Piggyback:200] Out: 1050 [Urine:1000; Blood:50] Intake/Output this shift: No intake/output data recorded.  PE: Abd: soft, appropriately tender, incisions c/d/i, +BS, ND  Lab Results:  Recent Labs    01/18/20 1538  WBC 10.3  HGB 13.9  HCT 45.1  PLT 296   BMET Recent Labs    01/18/20 1538  NA 140  K 3.4*  CL 104  CO2 25  GLUCOSE 139*  BUN 15  CREATININE 0.93  CALCIUM 9.9   PT/INR No results for input(s): LABPROT, INR in the last 72 hours. CMP     Component Value Date/Time   NA 140 01/18/2020 1538   NA 142 09/21/2019 0804   K 3.4 (L) 01/18/2020 1538   CL 104 01/18/2020 1538   CO2 25 01/18/2020 1538   GLUCOSE 139 (H) 01/18/2020 1538   BUN 15 01/18/2020 1538   BUN 7 09/21/2019 0804   CREATININE 0.93 01/18/2020 1538   CALCIUM 9.9 01/18/2020 1538   PROT 7.4 01/18/2020 1538   PROT 6.3 09/21/2019 0804   ALBUMIN 4.4 01/18/2020 1538   ALBUMIN 4.4 09/21/2019 0804   AST 14 (L) 01/18/2020 1538   ALT 14 01/18/2020 1538   ALKPHOS 64 01/18/2020 1538   BILITOT 0.9 01/18/2020 1538   BILITOT 0.3 09/21/2019 0804   GFRNONAA >60 01/18/2020 1538   GFRAA >60 01/18/2020 1538   Lipase     Component Value Date/Time   LIPASE 33 01/18/2020 1538       Studies/Results: DG Abdomen 1 View  Result Date:  01/18/2020 CLINICAL DATA:  The patient began pill endoscopy 2 weeks ago. EXAM: ABDOMEN - 1 VIEW COMPARISON:  KUB 01/07/2020.  CT abdomen and pelvis 01/14/2020. FINDINGS: Endoscopy capsule projecting in the pelvis just to the right of midline is again seen. The position is not notably changed. Bowel gas pattern is nonobstructive. No unexpected abdominal calcification or focal bony abnormality. IMPRESSION: Endoscopy capsule is essentially unchanged in position projecting in the pelvis just to the right of midline. Electronically Signed   By: Inge Rise M.D.   On: 01/18/2020 16:18    Anti-infectives: Anti-infectives (From admission, onward)   Start     Dose/Rate Route Frequency Ordered Stop   01/19/20 1100  cefoTEtan (CEFOTAN) 2 g in sodium chloride 0.9 % 100 mL IVPB     2 g 200 mL/hr over 30 Minutes Intravenous On call to O.R. 01/19/20 1058 01/19/20 1506       Assessment/Plan POD 1, s/p lap assisted SBR for retained capsule endoscope, Dr. Dema Severin, 5/19 -tolerating liquids, adv to soft diet -mobilize, pulm toilet today -hopefully home tomorrow if doing well  FEN - soft diet, SLIV VTE - lovenox ID - none needed   LOS: 2 days    Cristina Conner , [facility]  01/20/2020, 9:57 AM Please see Amion for pager number during day hours 7:00am-4:30pm or 7:00am -11:30am on weekends

## 2020-01-21 ENCOUNTER — Ambulatory Visit: Payer: BC Managed Care – PPO | Admitting: Physician Assistant

## 2020-01-21 ENCOUNTER — Ambulatory Visit: Payer: BC Managed Care – PPO | Admitting: Psychology

## 2020-01-21 LAB — VITAMIN B12: Vitamin B-12: 228 pg/mL (ref 180–914)

## 2020-01-21 LAB — TSH: TSH: 0.754 u[IU]/mL (ref 0.350–4.500)

## 2020-01-21 LAB — SURGICAL PATHOLOGY

## 2020-01-21 LAB — FOLATE: Folate: 2.7 ng/mL — ABNORMAL LOW (ref >5.9)

## 2020-01-21 LAB — FERRITIN: Ferritin: 42 ng/mL (ref 11–307)

## 2020-01-21 MED ORDER — TRAMADOL HCL 50 MG PO TABS: 50.0000 mg | ORAL_TABLET | Freq: Four times a day (QID) | ORAL | 0 refills | Status: DC | PRN

## 2020-01-21 NOTE — Discharge Summary (Addendum)
Patient ID: Cristina Conner [id number] 02-05-1961 59 y.o.  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 01/19/2020 01/18/2020   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 2020 09/07/2019   Hemoglobin low 09/07/2019   Primary localized osteoarthritis of left hip Sep 07, 2019   Death of family member-  husband passed Jul 2020 04/29/2019   Reactive depression 04/01/2019   Sleep difficulties 09/09/2018   Pre-diabetes 03/09/2018   Postmenopausal state- went thru early 40's 01/02/2018   Primary osteoarthritis of left hip 01/02/2018   Caregiver burden- for husband with ALLeukemia 01/02/2018   Left hip pain 01/02/2018   Osteopenia 07/31/2016   Hypertension 05/20/2011   Menopausal state 05/20/2011  s/p dx lap with SBR and removal of retained capsule endoscope HTN Intermittent tachycardia  Consultants Hospitalist  Reason for Admission: This is a 59 year old patient Dr. Silvano Rusk who is several weeks status post swallowing a capsule endoscopy. The capsule, unfortunate, has not passed. She has had intermittent nausea and vomiting which she was even having prior to the capsule. She had a previous CT scan in March prior to the endoscopy showing small bowel inflammation. On multiple plain abdominal x-rays the capsule remains in the pelvis. CT scan on 5/14 shows that the capsule is in the distal ileum. She has had a resolution of the inflammatory process in the small bowel in the interim. She reports that her pain is only mild. She has not had a bowel movement for several days but is passing flatus. She does feel weak and slightly lightheaded.  Procedures Diagnostic laparoscopy with SBR and  removal of retained capsule endoscope  Hospital Course:  The patient was admitted and taken to the OR the following day for the above procedure.  She tolerated this well and her diet was advanced to soft as tolerated.  She was eating great and no further issues with nausea and vomiting as she had chronically prior to admission.  On POD 2, she was surgically stable.  She developed some weakness prior to her surgery.  She continued to have some weakness and PT eval was ordered post op.  HH PT was recommended this was arranged for the patient.   On POD 3, the patient was stable for DC home, but then developed some tachycardia and HTN with ambulation.  Hospitalist were consulted. Her HTN medications were adjusted.  She ultimately was transitioned to labetalol for her HTN and tachycardia as she said the norvasc gave her a HA.  It was felt the HA was actually from her HTN, but the patient preferred to not take this any more.  She was felt stable on 5/24 from a medical standpoint for discharge home.  She is encourage to follow up with her PCP prn for her chronic medical problems and further management of medication adjustment.  She will also need to follow up with GI regarding any chronic abdominal complaints as well.  She will follow up with Dr. Dema Severin in the office for her surgical follow up.  Physical Exam: Heart: mildly tachy Lungs: CTAB Abd: soft, appropriately tender, +BS, ND, incisions c/d/i  Allergies as of 01/24/2020       Reactions   Hydrocodone Nausea And Vomiting  Medication List     STOP taking these medications    mirtazapine 7.5 MG tablet Commonly known as: REMERON       TAKE these medications    acetaminophen 500 MG tablet Commonly known as: TYLENOL Take 1,000 mg by mouth as needed for moderate pain.   busPIRone 5 MG tablet Commonly known as: BUSPAR Take 1 tablet (5 mg total) by mouth 3 (three) times daily.   cyanocobalamin 1000 MCG tablet Take 1 tablet (1,000  mcg total) by mouth daily.   dicyclomine 10 MG capsule Commonly known as: BENTYL Take 1 capsule (10 mg total) by mouth 3 (three) times daily before meals.   eszopiclone 1 MG Tabs tablet Commonly known as: LUNESTA Take 1 mg by mouth at bedtime as needed for sleep.   famotidine 40 MG tablet Commonly known as: PEPCID Take 40 mg by mouth daily.   FLUoxetine 10 MG tablet Commonly known as: PROZAC Take 1 tablet (10 mg total) by mouth daily.   folic acid 1 MG tablet Commonly known as: FOLVITE Take 1 tablet (1 mg total) by mouth daily.   Integra 62.5-62.5-40-3 MG Caps Take 62.5 mg by mouth daily.   iron polysaccharides 150 MG capsule Commonly known as: NIFEREX Take 1 capsule (150 mg total) by mouth daily.   metoprolol tartrate 25 MG tablet Commonly known as: LOPRESSOR Take 1 tablet (25 mg total) by mouth 2 (two) times daily.   ondansetron 4 MG tablet Commonly known as: ZOFRAN Take 1 tablet (4 mg total) by mouth every 6 (six) hours as needed for nausea or vomiting.   ondansetron 8 MG disintegrating tablet Commonly known as: ZOFRAN-ODT Take 1 tablet (8 mg total) by mouth every 8 (eight) hours as needed for nausea or refractory nausea / vomiting.   traMADol 50 MG tablet Commonly known as: ULTRAM Take 1 tablet (50 mg total) by mouth every 6 (six) hours as needed (pain not controlled with tylenol and ibuprofen).         Follow-up Information     Ileana Roup, MD. Go on 02/16/2020.   Specialty: General Surgery Why: Follow up appointment scheduled for 2:15 PM. Please arrive 30 min prior to appointment time. Bring photo ID and insurance information.  Contact information: [place] [address] [phone]         Care, [facility] Follow up.   Specialty: Home Health Services Why: to provide home health physical therapy Contact information: [address] [place] [address] [phone]         Mellody Dance, DO  Follow up in 1 week(s).   Specialty: Family Medicine Contact information: [phone] [place] [address] [phone]            Signed: Saverio Danker, [facility] 01/24/2020, 10:42 AM Please see Amion for pager number during day hours 7:00am-4:30pm, 7-11:30am on Weekends  Because of neurologic changes - she underwent a CT scan of her head on  01/24/2020 (the day of planned discharge) - the results showed moderate hydrocephalus. No prior studies for comparison.        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 Fourth  ventricle is displaced to the right and distorted. Recommend MRI  brain without with contrast for further evaluation.         She is  undergoing an emergency transfer to [facility] to be evaluated by neurosurgery and CCM.  Alphonsa Overall, MD, [facility] Office phone:  [phone]

## 2020-01-21 NOTE — TOC Transition Note (Signed)
Transition of Care [facility]) - CM/SW Discharge Note   Patient Details  Name: Cristina Conner MRN: [id number] Date of Birth: 04-15-1961  [facility]) CM/SW Contact:  Lennart Pall, LCSW Phone Number: 01/21/2020, 3:04 PM   Clinical Narrative:   Alerted by PA of referral for HHPT.  Pt for d/c today.  Have placed referral with [facility].  No further needs.    Final next level of care: [place] Barriers to Discharge: Barriers Resolved   Patient Goals and CMS Choice        Discharge Placement                       Discharge Plan and Services                DME Arranged: N/A DME Agency: NA       HH Arranged: PT HH Agency: [place] Date [facility] Agency Contacted: 01/21/20 Time [place]: 1504 Representative spoke with at [place]: [place] Determinants of Health ([place]) Interventions     Readmission Risk Interventions No flowsheet data found.

## 2020-01-21 NOTE — Evaluation (Signed)
Physical Therapy Evaluation Patient Details Name: Cristina Conner MRN: [id number] DOB: 1960-12-11 Today's Date: 01/21/2020   History of Present Illness  s/p lap assisted SBR for retained capsule endoscope per Dr. Dema Severin, 5/19. PMH: HTN, L DA THA 2020  Clinical Impression  Pt admitted with above diagnosis.  Pt amb short hallway distance with min assist, definite balance issues noted --pt amb with wide BOS, near festinating gait at times and diminished coordination. Will continue to follow in acute setting.  Pt currently with functional limitations due to the deficits listed below (see PT Problem List). Pt will benefit from skilled PT to increase their independence and safety with mobility to allow discharge to the venue listed below.       Follow Up Recommendations Home health PT;Supervision for mobility/OOB    Equipment Recommendations  None recommended by PT    Recommendations for Other Services       Precautions / Restrictions Precautions Precautions: Fall Restrictions Weight Bearing Restrictions: No      Mobility  Bed Mobility Overal bed mobility: Needs Assistance Bed Mobility: Sit to Sidelying         Sit to sidelying: Min assist General bed mobility comments: cues for technique, assist to lift LEs onto bed  Transfers Overall transfer level: Needs assistance Equipment used: Rolling walker (2 wheeled) Transfers: Sit to/from Stand Sit to Stand: Min assist         General transfer comment: cues for hand placement and to power up with LEs  Ambulation/Gait Ambulation/Gait assistance: Min assist Gait Distance (Feet): 40 Feet Assistive device: Rolling walker (2 wheeled) Gait Pattern/deviations: Trunk flexed;Wide base of support;Step-to pattern;Decreased step length - right;Decreased step length - left     General Gait Details: pt requiring assist for balance and to maneuver RW safely, especially with turns. pt with festinating type gait at times, cues for posture  and to incr step length bil  Stairs            Wheelchair Mobility    Modified Rankin (Stroke Patients Only)       Balance Overall balance assessment: Needs assistance Sitting-balance support: No upper extremity supported;Feet supported Sitting balance-Leahy Scale: Fair Sitting balance - Comments: difficulty d/t abd pain with wt shifting   Standing balance support: During functional activity;Bilateral upper extremity supported;Single extremity supported Standing balance-Leahy Scale: Poor Standing balance comment: reliant on UE support at least unilateral for balance. stood at sink to brush teeth                             Pertinent Vitals/Pain      Home Living Family/patient expects to be discharged to:: Private residence Living Arrangements: Alone Available Help at Discharge: Family Type of Home: House Home Access: Stairs to enter   Technical brewer of Steps: 2 Home Layout: One [place]: Environmental consultant - 2 wheels;Bedside commode Additional Comments: pt dtr reports she will be staying with her mother at d/c    Prior Function Level of Independence: Independent         Comments: dtr states she is independent     Hand Dominance        Extremity/Trunk Assessment   Upper Extremity Assessment Upper Extremity Assessment: Overall WFL for tasks assessed    Lower Extremity Assessment Lower Extremity Assessment: Generalized weakness;RLE deficits/detail;LLE deficits/detail RLE Coordination: decreased gross motor LLE Coordination: decreased gross motor       Communication   Communication: No difficulties  Cognition Arousal/Alertness: Awake/alert Behavior During Therapy: WFL for tasks assessed/performed Overall Cognitive Status: Within Functional Limits for tasks assessed                                        General Comments      Exercises     Assessment/Plan    PT Assessment Patient needs continued PT  services  PT Problem List Decreased strength;Decreased mobility;Decreased activity tolerance;Decreased balance;Decreased knowledge of use of DME;Pain;Decreased coordination       PT Treatment Interventions DME instruction;Therapeutic exercise;Functional mobility training;Therapeutic activities;Patient/family education;Gait training;Balance training    PT Goals (Current goals can be found in the Care Plan section)  Acute Rehab PT Goals Patient Stated Goal: feel better, less pain PT Goal Formulation: With patient Time For Goal Achievement: 01/28/20 Potential to Achieve Goals: Good    Frequency Min 3X/week   Barriers to discharge        Co-evaluation               AM-PAC PT "6 Clicks" Mobility  Outcome Measure Help needed turning from your back to your side while in a flat bed without using bedrails?: A Little Help needed moving from lying on your back to sitting on the side of a flat bed without using bedrails?: A Little Help needed moving to and from a bed to a chair (including a wheelchair)?: A Little Help needed standing up from a chair using your arms (e.g., wheelchair or bedside chair)?: A Little Help needed to walk in hospital room?: A Little Help needed climbing 3-5 steps with a railing? : A Lot 6 Click Score: 17    End of Session   Activity Tolerance: Patient limited by fatigue;Patient limited by pain Patient left: in bed;with call bell/phone within reach;with family/visitor present;with bed alarm set Nurse Communication: Mobility status PT Visit Diagnosis: Difficulty in walking, not elsewhere classified (R26.2);Other abnormalities of gait and mobility (R26.89);Unsteadiness on feet (R26.81)    Time: [id number] PT Time Calculation (min) (ACUTE ONLY): 28 min   Charges:   PT Evaluation $PT Eval Low Complexity: 1 Low PT Treatments $Gait Training: 8-22 mins        Baxter Flattery, PT   Acute Rehab Dept [facility]): [id number]   01/21/2020   [facility] 01/21/2020,  1:11 PM

## 2020-01-21 NOTE — Progress Notes (Signed)
Patient ID: Cristina Conner, female   DOB: 15-Oct-1960, 59 y.o.   MRN: [id number]    2 Days Post-Op  Subjective: C/o some urinary incontinence which seems new.  Some instability with mobilization.  Eating very well she says with no nausea.  Passing flatus.  Abdominal pain as anticipated with mobility but well controlled when resting.  ROS: See above, otherwise other systems negative  Objective: Vital signs in last 24 hours: Temp:  [98.7 F (37.1 C)-99.1 F (37.3 C)] 98.7 F (37.1 C) (05/21 0602) Pulse Rate:  [85-104] 100 (05/21 0602) Resp:  [15-18] 15 (05/21 0602) BP: (140-158)/(82-88) 142/88 (05/21 0602) SpO2:  [99 %-100 %] 99 % (05/21 0602) Last BM Date: 01/10/20  Intake/Output from previous day: 05/20 0701 - 05/21 0700 In: 600 [P.O.:600] Out: 800 [Urine:800] Intake/Output this shift: Total I/O In: -  Out: 400 [Urine:400]  PE: Heart: regular, mildly tachy at times Lungs: CTAB Abd: soft, appropriately tender, incisions c/d/i, +BS  Lab Results:  Recent Labs    01/18/20 1538  WBC 10.3  HGB 13.9  HCT 45.1  PLT 296   BMET Recent Labs    01/18/20 1538  NA 140  K 3.4*  CL 104  CO2 25  GLUCOSE 139*  BUN 15  CREATININE 0.93  CALCIUM 9.9   PT/INR No results for input(s): LABPROT, INR in the last 72 hours. CMP     Component Value Date/Time   NA 140 01/18/2020 1538   NA 142 09/21/2019 0804   K 3.4 (L) 01/18/2020 1538   CL 104 01/18/2020 1538   CO2 25 01/18/2020 1538   GLUCOSE 139 (H) 01/18/2020 1538   BUN 15 01/18/2020 1538   BUN 7 09/21/2019 0804   CREATININE 0.93 01/18/2020 1538   CALCIUM 9.9 01/18/2020 1538   PROT 7.4 01/18/2020 1538   PROT 6.3 09/21/2019 0804   ALBUMIN 4.4 01/18/2020 1538   ALBUMIN 4.4 09/21/2019 0804   AST 14 (L) 01/18/2020 1538   ALT 14 01/18/2020 1538   ALKPHOS 64 01/18/2020 1538   BILITOT 0.9 01/18/2020 1538   BILITOT 0.3 09/21/2019 0804   GFRNONAA >60 01/18/2020 1538   GFRAA >60 01/18/2020 1538   Lipase     Component  Value Date/Time   LIPASE 33 01/18/2020 1538       Studies/Results: No results found.  Anti-infectives: Anti-infectives (From admission, onward)   Start     Dose/Rate Route Frequency Ordered Stop   01/19/20 1100  cefoTEtan (CEFOTAN) 2 g in sodium chloride 0.9 % 100 mL IVPB     2 g 200 mL/hr over 30 Minutes Intravenous On call to O.R. 01/19/20 1058 01/19/20 1506       Assessment/Plan POD 2, s/p lap assisted SBR for retained capsule endoscope, Dr. Dema Severin, 5/19 -soft diet going well -PT to see today.  If does well with therapy, plan for DC home later today vs tomorrow -unclear why she has some urinary incontinence today.  Hopefully this is self-limiting but discussed with patient that this may something she will need to have evaluated by PCP vs uro as an outpatient if it continues.  FEN - soft diet, SLIV VTE - lovenox ID - none needed   LOS: 3 days    [name] , [facility] 01/21/2020, 9:42 AM Please see Amion for pager number during day hours 7:00am-4:30pm or 7:00am -11:30am on weekends

## 2020-01-22 LAB — IGA: IgA: 115 mg/dL (ref 87–352)

## 2020-01-22 MED ORDER — BISACODYL 10 MG RE SUPP
10.0000 mg | Freq: Two times a day (BID) | RECTAL | Status: DC | PRN
  Administered 2020-01-22: 10 mg via RECTAL
  Filled 2020-01-22: qty 1

## 2020-01-22 MED ORDER — SODIUM CHLORIDE 0.9% FLUSH: 3.0000 mL | INTRAVENOUS | Status: DC | PRN

## 2020-01-22 MED ORDER — FLUOXETINE HCL 10 MG PO CAPS
10.0000 mg | ORAL_CAPSULE | Freq: Every day | ORAL | Status: DC
  Administered 2020-01-22 – 2020-01-27 (×6): 10 mg via ORAL
  Filled 2020-01-22 (×7): qty 1

## 2020-01-22 MED ORDER — LIP MEDEX EX OINT
1.0000 "application " | TOPICAL_OINTMENT | Freq: Two times a day (BID) | CUTANEOUS | Status: DC
  Administered 2020-01-22: 1 via TOPICAL
  Filled 2020-01-22 (×2): qty 7

## 2020-01-22 MED ORDER — POLYETHYLENE GLYCOL 3350 17 G PO PACK
17.0000 g | PACK | Freq: Two times a day (BID) | ORAL | Status: DC | PRN
  Administered 2020-01-25: 17 g via ORAL
  Filled 2020-01-22: qty 1

## 2020-01-22 MED ORDER — LACTATED RINGERS IV BOLUS: 1000.0000 mL | Freq: Three times a day (TID) | INTRAVENOUS | Status: AC | PRN

## 2020-01-22 MED ORDER — METOPROLOL TARTRATE 5 MG/5ML IV SOLN
5.0000 mg | Freq: Four times a day (QID) | INTRAVENOUS | Status: DC | PRN
  Filled 2020-01-22: qty 5

## 2020-01-22 MED ORDER — MAGIC MOUTHWASH
15.0000 mL | Freq: Four times a day (QID) | ORAL | Status: DC | PRN
  Filled 2020-01-22: qty 15

## 2020-01-22 MED ORDER — PSYLLIUM 95 % PO PACK
1.0000 | PACK | Freq: Every day | ORAL | Status: DC
  Filled 2020-01-22: qty 1

## 2020-01-22 MED ORDER — METHOCARBAMOL 1000 MG/10ML IJ SOLN
1000.0000 mg | Freq: Four times a day (QID) | INTRAVENOUS | Status: DC | PRN
  Administered 2020-01-23: 1000 mg via INTRAVENOUS
  Filled 2020-01-22: qty 10
  Filled 2020-01-22: qty 1000

## 2020-01-22 MED ORDER — ALUM & MAG HYDROXIDE-SIMETH 200-200-20 MG/5ML PO SUSP
30.0000 mL | Freq: Four times a day (QID) | ORAL | Status: DC | PRN
  Administered 2020-01-22 (×2): 30 mL via ORAL
  Filled 2020-01-22 (×2): qty 30

## 2020-01-22 MED ORDER — SIMETHICONE 40 MG/0.6ML PO SUSP
40.0000 mg | Freq: Four times a day (QID) | ORAL | Status: DC | PRN
  Filled 2020-01-22: qty 0.6

## 2020-01-22 MED ORDER — INTEGRA 62.5-62.5-40-3 MG PO CAPS: 62.5000 mg | ORAL_CAPSULE | Freq: Every day | ORAL | Status: DC

## 2020-01-22 MED ORDER — SODIUM CHLORIDE 0.9% FLUSH
3.0000 mL | Freq: Two times a day (BID) | INTRAVENOUS | Status: DC
  Administered 2020-01-22 – 2020-02-03 (×20): 3 mL via INTRAVENOUS

## 2020-01-22 MED ORDER — SODIUM CHLORIDE 0.9 % IV SOLN
250.0000 mL | INTRAVENOUS | Status: DC | PRN
  Administered 2020-01-23 – 2020-01-27 (×2): 250 mL via INTRAVENOUS

## 2020-01-22 MED ORDER — DICYCLOMINE HCL 10 MG PO CAPS
10.0000 mg | ORAL_CAPSULE | Freq: Three times a day (TID) | ORAL | Status: DC
  Administered 2020-01-22 – 2020-01-26 (×14): 10 mg via ORAL
  Filled 2020-01-22 (×19): qty 1

## 2020-01-22 MED ORDER — ZOLPIDEM TARTRATE 5 MG PO TABS: 5.0000 mg | ORAL_TABLET | Freq: Every evening | ORAL | Status: DC | PRN

## 2020-01-22 MED ORDER — POLYSACCHARIDE IRON COMPLEX 150 MG PO CAPS
150.0000 mg | ORAL_CAPSULE | Freq: Every day | ORAL | Status: DC
  Administered 2020-01-22 – 2020-01-26 (×5): 150 mg via ORAL
  Filled 2020-01-22 (×7): qty 1

## 2020-01-22 MED ORDER — METOPROLOL TARTRATE 12.5 MG HALF TABLET
12.5000 mg | ORAL_TABLET | Freq: Two times a day (BID) | ORAL | Status: DC
  Administered 2020-01-22: 12.5 mg via ORAL
  Filled 2020-01-22: qty 1

## 2020-01-22 MED ORDER — FAMOTIDINE 20 MG PO TABS
40.0000 mg | ORAL_TABLET | Freq: Every day | ORAL | Status: DC
  Administered 2020-01-22 – 2020-01-26 (×5): 40 mg via ORAL
  Filled 2020-01-22 (×6): qty 2

## 2020-01-22 MED ORDER — POLYETHYLENE GLYCOL 3350 17 G PO PACK
17.0000 g | PACK | Freq: Two times a day (BID) | ORAL | Status: DC
  Administered 2020-01-22 – 2020-01-24 (×6): 17 g via ORAL
  Filled 2020-01-22 (×6): qty 1

## 2020-01-22 NOTE — Progress Notes (Signed)
Physical Therapy Treatment Patient Details Name: Cristina Conner MRN: [id number] DOB: [birth year] Today's Date: 01/22/2020    History of Present Illness s/p lap assisted SBR for retained capsule endoscope, Dr. Dema Severin, 5/19. PMH: HTN, L DA THA 2020    PT Comments    Pt and dtr had requested HEP. Returned to see pt as we were not able to complete exercises earlier d/t interruptions from other staff.  Provided with and reviewed (progressive) medbridge handout with pt.   Follow Up Recommendations  Home health PT;Supervision for mobility/OOB     Equipment Recommendations  None recommended by PT    Recommendations for Other Services       Precautions / Restrictions Precautions Precautions: Fall Restrictions Weight Bearing Restrictions: No    Mobility  Bed Mobility Overal bed mobility: Needs Assistance Bed Mobility:  Rolling: Min assist Sidelying to sit: Min assist;HOB elevated     Sit to sidelying: Min assist General bed mobility comments: cues for technique, assist to lift LEs on to bed   Transfers Overall transfer level: Needs assistance Equipment used: Rolling walker (2 wheeled) Transfers: Sit to/from Stand Sit to Stand: Min assist;Min guard         General transfer comment: cues for hand placement and to power up with LEs, incr time   Ambulation/Gait Ambulation/Gait assistance: Min assist Gait Distance (Feet): 15 Feet(x2) Assistive device: Rolling walker (2 wheeled) Gait Pattern/deviations: Trunk flexed;Wide base of support;Step-to pattern;Decreased step length - right;Decreased step length - left     General Gait Details: pt requiring assist for balance and to maneuver RW safely, especially with turns.  cues for posture and to incr step length bil   Stairs             Wheelchair Mobility    Modified Rankin (Stroke Patients Only)       Balance   Sitting-balance support: No upper extremity supported;Feet supported Sitting balance-Leahy Scale:  Fair Sitting balance - Comments: difficulty d/t abd pain with wt shifting   Standing balance support: During functional activity;Bilateral upper extremity supported;Single extremity supported Standing balance-Leahy Scale: Fair Standing balance comment:  stood at sink to wash hands without UE support, wide BOS                             Cognition Arousal/Alertness: Awake/alert Behavior During Therapy: WFL for tasks assessed/performed Overall Cognitive Status: Within Functional Limits for tasks assessed                                        Exercises General Exercises - Lower Extremity Long Arc Quad: AROM;Both;10 reps;Seated Hip Flexion/Marching: AROM;Both;10 reps;Seated Toe Raises: AROM;10 reps;Seated;Both Heel Raises: AROM;Both;10 reps;Seated    General Comments        Pertinent Vitals/Pain Pain Assessment: 0-10 Pain Score: 3  Pain Location: abd  Pain Descriptors / Indicators: Discomfort;Sore Pain Intervention(s): Limited activity within patient's tolerance;Monitored during session;Premedicated before session    Home Living                      Prior Function            PT Goals (current goals can now be found in the care plan section) Acute Rehab PT Goals Patient Stated Goal: feel better, less pain PT Goal Formulation: With patient Time For Goal Achievement: 01/28/20 Potential to  Achieve Goals: Good Progress towards PT goals: Progressing toward goals    Frequency    Min 3X/week      PT Plan Current plan remains appropriate    Co-evaluation              AM-PAC PT "6 Clicks" Mobility   Outcome Measure  Help needed turning from your back to your side while in a flat bed without using bedrails?: A Little Help needed moving from lying on your back to sitting on the side of a flat bed without using bedrails?: A Little Help needed moving to and from a bed to a chair (including a wheelchair)?: A Little Help needed  standing up from a chair using your arms (e.g., wheelchair or bedside chair)?: A Little Help needed to walk in hospital room?: A Little Help needed climbing 3-5 steps with a railing? : A Lot 6 Click Score: 17    End of Session Equipment Utilized During Treatment: Gait belt Activity Tolerance: Patient tolerated treatment well Patient left: with call bell/phone within reach;in bed;with bed alarm set Nurse Communication: Mobility status PT Visit Diagnosis: Difficulty in walking, not elsewhere classified (R26.2);Other abnormalities of gait and mobility (R26.89);Unsteadiness on feet (R26.81)     Time: [id number] PT Time Calculation (min) (ACUTE ONLY): 17 min  Charges:  $Therapeutic Exercise: 8-22 mins                     Baxter Flattery, PT   Acute Rehab Dept [facility]): [id number]   01/22/2020    [facility] 01/22/2020, 3:51 PM

## 2020-01-22 NOTE — Progress Notes (Signed)
Cristina Conner [id number] 04/30/1961  CARE TEAM:  PCP: Lorrene Reid, PA-C  Outpatient Care Team: Patient Care Team: Lorrene Reid, PA-C as PCP - General (Physician Assistant) Druscilla Brownie, MD as Consulting Physician (Dermatology) Opthamology, Lady Gary (Ophthalmology) Princess Bruins, MD as Consulting Physician (Obstetrics and Gynecology) Melrose Nakayama, MD as Consulting Physician (Orthopedic Surgery)  Inpatient Treatment Team: Treatment Team: Attending Provider: Nolon Nations, MD; Registered Nurse: Heloise Ochoa, RN; Technician: Leda Quail, NT; Registered Nurse: Lyndal Pulley, RN; Technician: Lewanda Rife, NT; Physical Therapist: Neil Crouch, PT   Problem List:   Active Problems:   SBO (small bowel obstruction) ([place])   3 Days Post-Op  01/19/2020  POST-OPERATIVE DIAGNOSIS:   1. Retained capsule endoscope in mid ileum 2. 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 Laparoscopic-assisted small bowel resection 2. Diagnostic laparoscopy 3. Bilateral transversus abdominus plane blocks  SURGEON:  Camaya Mt. White, MD  PATHOLOGY  A. SEGMENT OF ILEUM, RESECTION:  - Benign small bowel. Transmural incision present grossly.   B. CAPSULE ENDOSCOPE, EXPLANTATION:  - Consistent with clinically stated capsule endoscope. [name]  examination only.  Assessment  Recovering  [name] Stay = 4 days)   Assessment/Plan  s/p lap assisted SBR for retained capsule endoscope, Dr. Dema Severin, 5/19 -Advance to heart healthy diet.  With flatus, do bowel regimen since she struggled with constipation.  Pathology benign.  No tumor.    Check orthostatics with her complaint of having weak extremities.  If she is feeling stronger and better, perhaps allow her to discharge later today with plan of close home health physical therapy follow-up.  Otherwise low threshold to keep her over the weekend for her to mobilize and get stronger  since she claims she normally is rather active.  Check urinalysis with some concerns of urinary incontinence and other issues.  I believe was ordered but I do not see that has been collected.  Can I&O cath if needed.   Hopefully this is self-limiting but discussed with patient that this may something she will need to have evaluated by PCP vs urology as an outpatient if it continues.  FEN -soft diet, SLIV VTE -lovenox ID -none needed  25 minutes spent in review, evaluation, examination, counseling, and coordination of care.  More than 50% of that time was spent in counseling.  01/22/2020    Subjective: (Chief complaint)  Patient had episode of weak legs and slid to floor when she was trying to get to the bedside commode in the middle the night.  Patient feels weak in her legs but not lightheaded or dizzy.  Daughter in room.  Tolerating  Objective:  Vital signs:  Vitals:   01/21/20 2126 01/22/20 0536 01/22/20 0609 01/22/20 0800  BP: (!) 150/87 (!) 142/95 123/74 (!) 153/82  Pulse: (!) 105 (!) 131 (!) 115 99  Resp: 18 18 18 16   Temp: 99 F (37.2 C) 98.9 F (37.2 C) 98.6 F (37 C) 98.1 F (36.7 C)  TempSrc: Oral Oral Oral Oral  SpO2: 96% 98% 100% 100%  Weight:      Height:        Last BM Date: 01/10/20  Intake/Output   Yesterday:  05/21 0701 - 05/22 0700 In: 1080 [P.O.:1080] Out: 2175 [Urine:2175] This shift:  No intake/output data recorded.  Bowel function:  Flatus: YES  BM:  No  Drain: (No drain)   Physical Exam:  General: Pt awake/alert in no acute distress.  Mildly tired & anxious but not toxic nor  sickly Eyes: PERRL, normal EOM.  Sclera clear.  No icterus Neuro: CN II-XII intact w/o focal sensory/motor deficits. Lymph: No head/neck/groin lymphadenopathy Psych:  No delerium/psychosis/paranoia.  Oriented x 4 HENT: Normocephalic, Mucus membranes moist.  No thrush Neck: Supple, No tracheal deviation.  No obvious thyromegaly Chest: No pain to chest  wall compression.  Good respiratory excursion.  No audible wheezing CV:  Pulses intact.  Regular rhythm.  No major extremity edema MS: Normal AROM mjr joints.  No obvious deformity  Abdomen: Soft.  Nondistended.  Nontender.  No evidence of peritonitis.  No incarcerated hernias.  Ext:   No deformity.  No mjr edema.  No cyanosis Skin: No petechiae / purpurea.  No major sores.  Warm and dry    Results:   SURGICAL PATHOLOGY  CASE: [id number]  PATIENT: Cristina Conner  Surgical Pathology Report      Clinical History: Small bowel obstruction      FINAL MICROSCOPIC DIAGNOSIS:   A. SEGMENT OF ILEUM, RESECTION:  - Benign small bowel. Transmural incision present grossly.   B. CAPSULE ENDOSCOPE, EXPLANTATION:  - Consistent with clinically stated capsule endoscope. [name]  examination only.   [name] DESCRIPTION:   A: Received fresh is an 11 cm segment of small intestine, clinically  ileum, with a suture attached identifying the distal end. The margins  are stapled. There is a 2 cm previous transmural incision located 1 cm  from the proximal margin. The serosa is smooth and tan. The mucosa is  glistening and tan. There are no discrete masses. Sections are  submitted in 3 cassettes.  1 = proximal margin.  2 = distal margin.  3 = representative sections.   B: Received fresh is a medical device consistent with clinically stated  capsule endoscope. The capsule measures 3 cm in length and 1.2 cm in  diameter. The ends of the capsule are rounded. The external surface is  smooth. No tissue is present. No sections are submitted. [name]  01/20/2020)     Final Diagnosis performed by Gillie Manners, MD.  Electronically  signed 01/21/2020  Technical component performed at [facility], [place]  [address] [place], [name].  Professional component performed at [employer]. [name],  [name], [place], [name].    Immunohistochemistry Technical component (if applicable) was performed  at [facility]. [address] [place],  [place], [place] [address].  IMMUNOHISTOCHEMISTRY DISCLAIMER (if applicable):  Some of these immunohistochemical stains may have been developed and the  performance characteristics determine by [facility]. Some  may not have been cleared or approved by the U.S. Food and Drug  Administration. The FDA has determined that such clearance or approval  is not necessary. This test is used for clinical purposes. It should not  be regarded as investigational or for research. This laboratory is  certified under the [place]  (CLIA-88) as qualified to perform high complexity clinical laboratory  testing. The controls stained appropriately  Cultures: Recent Results (from the past 720 hour(s))  SARS Coronavirus 2 by RT PCR (hospital order, performed in [facility] hospital lab) Nasopharyngeal Nasopharyngeal Swab     Status: None   Collection Time: 01/18/20  6:59 PM   Specimen: Nasopharyngeal Swab  Result Value Ref Range Status   SARS Coronavirus 2 NEGATIVE NEGATIVE Final    Comment: (NOTE) SARS-CoV-2 target nucleic acids are NOT DETECTED. The SARS-CoV-2 RNA is generally detectable in upper and lower  respiratory specimens during the acute phase of infection. The lowest concentration of SARS-CoV-2 viral copies this assay can detect is 250 copies / mL. A negative result does not preclude SARS-CoV-2 infection and should not be used as the sole basis for treatment or other patient management decisions.  A negative result may occur with improper specimen collection / handling, submission of specimen other than nasopharyngeal swab, presence of viral mutation(s) within the areas targeted by this assay, and inadequate number of viral copies (<250 copies / mL). A negative result must be combined with  clinical observations, patient history, and epidemiological information. Fact Sheet for Patients:   StrictlyIdeas.no Fact Sheet for Healthcare Providers: BankingDealers.co.za This test is not yet approved or cleared  by the [place] FDA and has been authorized for detection and/or diagnosis of SARS-CoV-2 by FDA under an Emergency Use Authorization (EUA).  This EUA will remain in effect (meaning this test can be used) for the duration of the COVID-19 declaration under Section 564(b)(1) of the Act, 21 U.S.C. section 360bbb-3(b)(1), unless the authorization is terminated or revoked sooner. Performed at [facility], [place] [address], [place], [place] [address]   MRSA PCR Screening     Status: None   Collection Time: 01/19/20 12:48 PM   Specimen: Nasal Mucosa; Nasopharyngeal  Result Value Ref Range Status   MRSA by PCR NEGATIVE NEGATIVE Final    Comment:        The GeneXpert MRSA Assay (FDA approved for NASAL specimens only), is one component of a comprehensive MRSA colonization surveillance program. It is not intended to diagnose MRSA infection nor to guide or monitor treatment for MRSA infections. Performed at [facility], [place] [address] [place], [place] [address]     Labs: Results for orders placed or performed during the hospital encounter of 01/18/20 (from the past 48 hour(s))  Vitamin B12     Status: None   Collection Time: 01/21/20  4:44 AM  Result Value Ref Range   Vitamin B-12 228 180 - 914 pg/mL    Comment: (NOTE) This assay is not validated for testing neonatal or myeloproliferative syndrome specimens for Vitamin B12 levels. Performed at [facility], [place] [address], [place], [place] [address]   Folate, serum, performed at [facility] lab     Status: Abnormal   Collection Time: 01/21/20  4:44 AM  Result Value Ref Range   Folate 2.7 (L) >5.9 ng/mL     Comment: Performed at [facility], [place] [address] [place], [place] [address]  Ferritin     Status: None   Collection Time: 01/21/20  4:44 AM  Result Value Ref Range   Ferritin 42 11 - 307 ng/mL    Comment: Performed at [facility], [place] [address], [place], [place] [address]  TSH     Status: None   Collection Time: 01/21/20  4:44 AM  Result Value Ref Range   TSH 0.754 0.350 - 4.500 uIU/mL    Comment: Performed by a 3rd Generation assay with a functional sensitivity of <=0.01 uIU/mL. Performed at [facility], [place] [address] [place], [place] [address]   IgA     Status: None   Collection Time: 01/21/20  4:44 AM  Result Value Ref Range   IgA 115 87 - 352 mg/dL    Comment: (NOTE) Performed At: [facility] [place], [place] [id number] Rush Farmer MD [id number]     Imaging / Studies:  No results found.  Medications / Allergies: per chart  Antibiotics: Anti-infectives (From admission, onward)   Start     Dose/Rate Route Frequency Ordered Stop   01/19/20 1100  cefoTEtan (CEFOTAN) 2 g in sodium chloride 0.9 % 100 mL IVPB     2 g 200 mL/hr over 30 Minutes Intravenous On call to O.R. 01/19/20 1058 01/19/20 1506        Note: Portions of this report may have been transcribed using voice recognition software. Every effort was made to ensure accuracy; however, inadvertent computerized transcription errors may be present.   Any transcriptional errors that result from this process are unintentional.    Adin Hector, MD, FACS, MASCRS Gastrointestinal and Minimally Invasive Surgery  [facility] [address] [place], [place] [address] [phone] Fax [phone] Main/Paging  CONTACT INFORMATION: Weekday (9AM-5PM) concerns: Call [facility] main office at [phone] Weeknight (5PM-9AM) or Weekend/Holiday concerns: Check [email or web address] for General Surgery [facility]  coverage (Please, do not use SecureChat as it is not reliable communication to surgeons for patient care)      01/22/2020  9:18 AM

## 2020-01-22 NOTE — Progress Notes (Signed)
NT witnessed patient slowly falling on her knees, and called RN for help. Patient was on her knees when RN came to the room, patient was assisted back to bed side commode. Upon assessment patient stated that she was trying to get up and go the bedside commode all by her self, she thought that she can do it, but her knees gave in and got weak and she slowly slide down to her knees on the floor. Patient was assessed, denies hitting her head to anything, denies pain and any issues so far, vitals was take and is on MEWS Yellow for increased heart rate. MEWS Yellow was escalated . Notified on call provider Dr. Harlow Asa no new orders was given, called [name] and explained what happened. Given report to day shift RN Nevin Bloodgood and shannon and [employer]

## 2020-01-22 NOTE — Progress Notes (Signed)
Physical Therapy Treatment Patient Details Name: Cristina Conner MRN: [id number] DOB: 10-30-1960 Today's Date: 01/22/2020    History of Present Illness s/p lap assisted SBR for retained capsule endoscope, Dr. Dema Conner, 5/19. PMH: HTN, L DA THA 2020    PT Comments    Pt progressing, incr gait distance today. Improved tolerance, continues to exhibit gait with wide BOS, decr step step length however improved with cues today. dtr present for session   Follow Up Recommendations  Home health PT;Supervision for mobility/OOB     Equipment Recommendations  None recommended by PT    Recommendations for Other Services       Precautions / Restrictions Precautions Precautions: Fall Restrictions Weight Bearing Restrictions: No    Mobility  Bed Mobility Overal bed mobility: Needs Assistance Bed Mobility: Rolling;Sidelying to Sit Rolling: Min assist Sidelying to sit: Min assist;HOB elevated       General bed mobility comments: cues to log roll, light assist to elevat trunk to full sitting   Transfers Overall transfer level: Needs assistance Equipment used: Rolling walker (2 wheeled) Transfers: Sit to/from Stand Sit to Stand: Min assist;Min guard         General transfer comment: cues for hand placement and to power up with LEs, incr time   Ambulation/Gait Ambulation/Gait assistance: Min assist Gait Distance (Feet): 60 Feet Assistive device: Rolling walker (2 wheeled) Gait Pattern/deviations: Trunk flexed;Wide base of support;Step-to pattern;Decreased step length - right;Decreased step length - left     General Gait Details: pt requiring assist for balance and to maneuver RW safely, especially with turns. pt with festinating type gait at times, cues for posture and to incr step length bil   Stairs             Wheelchair Mobility    Modified Rankin (Stroke Patients Only)       Balance   Sitting-balance support: No upper extremity supported;Feet  supported Sitting balance-Cristina Conner Scale: Fair Sitting balance - Comments: difficulty d/t abd pain with wt shifting   Standing balance support: During functional activity;Bilateral upper extremity supported;Single extremity supported Standing balance-Cristina Conner Scale: Poor Standing balance comment: reliant on UE support at least unilateral for balance. stood at sink to brush teeth                            Cognition Arousal/Alertness: Awake/alert Behavior During Therapy: WFL for tasks assessed/performed Overall Cognitive Status: Within Functional Limits for tasks assessed                                        Exercises      General Comments        Pertinent Vitals/Pain Pain Assessment: 0-10 Pain Score: 8  Pain Location: abd  Pain Descriptors / Indicators: Discomfort;Grimacing;Guarding;Sore Pain Intervention(s): Limited activity within patient's tolerance;Monitored during session;Ice applied;Repositioned(pt states she called for pain meds early am)    Home Living                      Prior Function            PT Goals (current goals can now be found in the care plan section) Acute Rehab PT Goals Patient Stated Goal: feel better, less pain PT Goal Formulation: With patient Time For Goal Achievement: 01/28/20 Potential to Achieve Goals: Good Progress towards PT goals: Progressing toward goals  Frequency    Min 3X/week      PT Plan Current plan remains appropriate    Co-evaluation              AM-PAC PT "6 Clicks" Mobility   Outcome Measure  Help needed turning from your back to your side while in a flat bed without using bedrails?: A Little Help needed moving from lying on your back to sitting on the side of a flat bed without using bedrails?: A Little Help needed moving to and from a bed to a chair (including a wheelchair)?: A Little Help needed standing up from a chair using your arms (e.g., wheelchair or bedside  chair)?: A Little Help needed to walk in hospital room?: A Little Help needed climbing 3-5 steps with a railing? : A Lot 6 Click Score: 17    End of Session Equipment Utilized During Treatment: Gait belt Activity Tolerance: Patient tolerated treatment well Patient left: in chair;with call bell/phone within reach;with chair alarm set;with nursing/sitter in room Nurse Communication: Mobility status PT Visit Diagnosis: Difficulty in walking, not elsewhere classified (R26.2);Other abnormalities of gait and mobility (R26.89);Unsteadiness on feet (R26.81)     Time: [id number] PT Time Calculation (min) (ACUTE ONLY): 20 min  Charges:  $Gait Training: 8-22 mins                     Cristina Conner, PT   Acute Rehab Dept [facility]): [id number]   01/22/2020    [facility] 01/22/2020, 12:04 PM

## 2020-01-23 DIAGNOSIS — F329 Major depressive disorder, single episode, unspecified: Secondary | ICD-10-CM

## 2020-01-23 DIAGNOSIS — I10 Essential (primary) hypertension: Secondary | ICD-10-CM

## 2020-01-23 DIAGNOSIS — E538 Deficiency of other specified B group vitamins: Secondary | ICD-10-CM

## 2020-01-23 DIAGNOSIS — D638 Anemia in other chronic diseases classified elsewhere: Secondary | ICD-10-CM

## 2020-01-23 LAB — CBC
HCT: 27.8 % — ABNORMAL LOW (ref 36.0–46.0)
Hemoglobin: 8.6 g/dL — ABNORMAL LOW (ref 12.0–15.0)
MCH: 25.1 pg — ABNORMAL LOW (ref 26.0–34.0)
MCHC: 30.9 g/dL (ref 30.0–36.0)
MCV: 81.3 fL (ref 80.0–100.0)
Platelets: 228 10*3/uL (ref 150–400)
RBC: 3.42 MIL/uL — ABNORMAL LOW (ref 3.87–5.11)
RDW: 19 % — ABNORMAL HIGH (ref 11.5–15.5)
WBC: 5 10*3/uL (ref 4.0–10.5)
nRBC: 0 % (ref 0.0–0.2)

## 2020-01-23 LAB — MAGNESIUM: Magnesium: 1.9 mg/dL (ref 1.7–2.4)

## 2020-01-23 LAB — CREATININE, SERUM
Creatinine, Ser: 0.79 mg/dL (ref 0.44–1.00)
GFR calc Af Amer: >60 mL/min (ref >60)
GFR calc non Af Amer: >60 mL/min (ref >60)

## 2020-01-23 LAB — LIPID PANEL
Cholesterol: 141 mg/dL (ref 0–200)
HDL: 34 mg/dL — ABNORMAL LOW (ref >40)
LDL Cholesterol: 74 mg/dL (ref 0–99)
Total CHOL/HDL Ratio: 4.1 RATIO
Triglycerides: 164 mg/dL — ABNORMAL HIGH (ref <150)
VLDL: 33 mg/dL (ref 0–40)

## 2020-01-23 LAB — T4, FREE: Free T4: 0.99 ng/dL (ref 0.61–1.12)

## 2020-01-23 LAB — URINALYSIS, ROUTINE W REFLEX MICROSCOPIC
Bacteria, UA: NONE SEEN
Bilirubin Urine: NEGATIVE
Glucose, UA: NEGATIVE mg/dL
Hgb urine dipstick: NEGATIVE
Ketones, ur: NEGATIVE mg/dL
Nitrite: NEGATIVE
Protein, ur: NEGATIVE mg/dL
Specific Gravity, Urine: 1.012 (ref 1.005–1.030)
pH: 8 (ref 5.0–8.0)

## 2020-01-23 LAB — HEMOGLOBIN A1C
Hgb A1c MFr Bld: 5.4 % (ref 4.8–5.6)
Mean Plasma Glucose: 108.28 mg/dL

## 2020-01-23 LAB — POTASSIUM: Potassium: 3.8 mmol/L (ref 3.5–5.1)

## 2020-01-23 LAB — LIPASE, BLOOD: Lipase: 26 U/L (ref 11–51)

## 2020-01-23 LAB — PREALBUMIN: Prealbumin: 20 mg/dL (ref 18–38)

## 2020-01-23 MED ORDER — SODIUM CHLORIDE 0.9 % IV SOLN
500.0000 mg | Freq: Once | INTRAVENOUS | Status: AC
  Administered 2020-01-23: 500 mg via INTRAVENOUS
  Filled 2020-01-23: qty 10

## 2020-01-23 MED ORDER — LABETALOL HCL 5 MG/ML IV SOLN
10.0000 mg | INTRAVENOUS | Status: DC | PRN
  Administered 2020-01-24: 10 mg via INTRAVENOUS
  Filled 2020-01-23 (×4): qty 4

## 2020-01-23 MED ORDER — AMLODIPINE BESYLATE 10 MG PO TABS
10.0000 mg | ORAL_TABLET | Freq: Every day | ORAL | Status: DC
  Administered 2020-01-23: 10 mg via ORAL
  Filled 2020-01-23: qty 1

## 2020-01-23 MED ORDER — ADULT MULTIVITAMIN W/MINERALS CH
1.0000 | ORAL_TABLET | Freq: Every day | ORAL | Status: DC
  Administered 2020-01-23 – 2020-01-26 (×4): 1 via ORAL
  Filled 2020-01-23 (×6): qty 1

## 2020-01-23 MED ORDER — FOLIC ACID 1 MG PO TABS
1.0000 mg | ORAL_TABLET | Freq: Every day | ORAL | Status: DC
  Administered 2020-01-23 – 2020-01-26 (×4): 1 mg via ORAL
  Filled 2020-01-23 (×6): qty 1

## 2020-01-23 MED ORDER — VITAMIN B-12 1000 MCG PO TABS
1000.0000 ug | ORAL_TABLET | Freq: Every day | ORAL | Status: DC
  Administered 2020-01-23 – 2020-01-26 (×4): 1000 ug via ORAL
  Filled 2020-01-23 (×7): qty 1

## 2020-01-23 MED ORDER — FLUOXETINE HCL 10 MG PO CAPS: 10.0000 mg | ORAL_CAPSULE | Freq: Every day | ORAL | Status: DC

## 2020-01-23 MED ORDER — HYDRALAZINE HCL 25 MG PO TABS
25.0000 mg | ORAL_TABLET | ORAL | Status: DC | PRN
  Administered 2020-01-24 – 2020-01-26 (×2): 25 mg via ORAL
  Filled 2020-01-23 (×2): qty 1

## 2020-01-23 MED ORDER — SODIUM CHLORIDE 0.9 % IV SOLN
25.0000 mg | Freq: Once | INTRAVENOUS | Status: AC
  Administered 2020-01-23: 25 mg via INTRAVENOUS
  Filled 2020-01-23: qty 0.5

## 2020-01-23 MED ORDER — BUSPIRONE HCL 10 MG PO TABS
5.0000 mg | ORAL_TABLET | Freq: Three times a day (TID) | ORAL | Status: DC
  Administered 2020-01-23 – 2020-01-27 (×13): 5 mg via ORAL
  Filled 2020-01-23 (×14): qty 1

## 2020-01-23 NOTE — Consult Note (Addendum)
Medical Consultation   Cristina Conner  [name]  DOB: [birth year]  DOA: 01/18/2020  PCP: Mellody Dance, DO  Requesting physician: Dr. Johney Maine  Reason for consultation: HTN, tachycardia   History of Present Illness: [name] is a 59 yo AAF with PMH HTN (dx ~4 yrs ago), anxiety/depression (s/p passing of husband), prediabetes (A1c 5.7-5.8%), diverticulosis who presented to the hospital on 01/18/20 with N/V/abd pain. She has been undergoing a large outpatient GI workup lately for lingering abdominal pain/symptoms with a rather unremarkable workup it appears.  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  She underwent diagnostic laparoscopy with lap assisted small bowel resection for capsule removal on 01/19/2020. Of note, her prior work-up was concerning for small bowel inflammation/wall thickening on CT in March 2021.  After further note review, there was concern from GI that her lisinopril could possibly be causing angioedema of the intestine due to ACEi effect (11/17/19 phone visit).  However, I cannot see a blood pressure medication change after this was brought up.  Furthermore, when discussing resuming the patient's blood pressure regimen of lisinopril/HCTZ, she did not clarify this to me. After the passing of her husband she was started on Prozac which was uptitrated, however after further PCP note review, the up titration worsened her anxiety and she was lowered back down to Prozac 10 mg daily with the addition of BuSpar (PCP visit 07/19/19).  At the next follow-up visit on 08/09/2019 with PCP, the patient had reported taking herself off of both Prozac and BuSpar and noting an improved mood and that she had been doing lifestyle changes. Furthermore, when discussing elevated blood pressure during this hospitalization, she states that she had taken herself off of her lisinopril-HCTZ approximately 2 weeks prior to this  hospitalization.  When asked why, she stated that she heard the "lisinopril was bad".  This was before record review had taken place noting that the plan was to transition her off of lisinopril to an alternative agent. Also, she did endorse wanting to go back on her Prozac and BuSpar to me today.  She was also taking it at home and had stopped both approximately 1 week ago.  It appears that she has probably been going on and off of it intermittently given prior history of stopping it cold [place] in December 2020 as well.  I instructed her that typically it should be tapered off safely if planning to stop.  She did voice understanding to these instructions.  Also, with further record review, when compliant on her BP regimen (lisinopril/hctz 20-12.5), her blood pressure at office visits seems to range low 130s/mid 80s.  She has chronically had sinus tachycardia dating back to at least 2019 very persistently (110s).  There is no LVH on EKGs after review either.  No echo has been performed to date.  Patient still states hypertension diagnosis has only been for approximately 4 years.   Review of Systems: Review of Systems - General ROS: negative for - chills, fatigue or fever Respiratory ROS: negative for - cough or shortness of breath Cardiovascular ROS: negative for - chest pain Gastrointestinal ROS: positive for - abdominal pain   Past Medical History: Past Medical History:  Diagnosis Date  . Anxiety   . Arthritis   . Diverticulosis   . GERD (gastroesophageal reflux disease)   . Hypertension   . Osteopenia   .  Post-operative nausea and vomiting    vomiting after general anesthesia per pt.  . Pre-diabetes     Past Surgical History: Past Surgical History:  Procedure Laterality Date  . CESAREAN SECTION     x 2  . COLONOSCOPY    . ENDOMETRIAL ABLATION    . ESOPHAGOGASTRODUODENOSCOPY  10/2019  . LAPAROSCOPY N/A 01/19/2020   Procedure: LAPAROSCOPY DIAGNOSTIC LAPAROTOMY WITH SMALL BOWEL  RESECTION;  Surgeon: Ileana Roup, MD;  Location: [facility];  Service: General;  Laterality: N/A;  . PELVIC LAPAROSCOPY  1999   left salpingectomy, excision of Right, paratubal cyst  . PELVIC LAPAROSCOPY     laparoscopy with lysis of pelvic adhesions  . SHOULDER SURGERY  11/2010   "FROZEN SHOULDER"  . TOTAL HIP ARTHROPLASTY Left 08/17/2019   Procedure: LEFT TOTAL HIP ARTHROPLASTY ANTERIOR APPROACH;  Surgeon: Melrose Nakayama, MD;  Location: [facility];  Service: Orthopedics;  Laterality: Left;  . TUBAL LIGATION    . UPPER GASTROINTESTINAL ENDOSCOPY      Allergies:   Allergies  Allergen Reactions  . Hydrocodone Nausea And Vomiting    Social History:  reports that she has never smoked. She has never used smokeless tobacco. She reports previous alcohol use. She reports that she does not use drugs.  Family History: Family History  Problem Relation Age of Onset  . Hypertension Mother   . Heart disease Mother   . Diabetes Mother   . Hypertension Father   . Colon cancer Neg Hx   . Esophageal cancer Neg Hx   . Stomach cancer Neg Hx   . Rectal cancer Neg Hx   . Colon polyps Neg Hx    Objective: Physical Exam: Vitals:   01/23/20 0754 01/23/20 0916 01/23/20 0918 01/23/20 0919  BP: (!) 155/93 (!) 145/83 (!) 142/102 (!) 135/93  Pulse: 88 99 (!) 112 (!) 125  Resp: 17     Temp: 98.1 F (36.7 C)     TempSrc: Oral     SpO2: 100% 100% 100% 100%  Weight:      Height:       General appearance: Pleasant adult woman resting in bed in no distress with slightly flat affect Head: Normocephalic, without obvious abnormality Eyes: EOMI Lungs: clear to auscultation bilaterally Heart: regular rate and rhythm, S1, S2 normal and No appreciated murmurs Abdomen: Bowel sounds present.  Appropriately tender to palpation from surgery Extremities: No edema Skin: mobility and turgor normal Neurologic: Grossly normal  Data reviewed:  I have personally reviewed following labs and imaging  studies Results for orders placed or performed during the hospital encounter of 01/18/20 (from the past 48 hour(s))  CBC     Status: Abnormal   Collection Time: 01/23/20  4:23 AM  Result Value Ref Range   WBC 5.0 4.0 - 10.5 K/uL   RBC 3.42 (L) 3.87 - 5.11 MIL/uL   Hemoglobin 8.6 (L) 12.0 - 15.0 g/dL   HCT 27.8 (L) 36.0 - 46.0 %   MCV 81.3 80.0 - 100.0 fL   MCH 25.1 (L) 26.0 - 34.0 pg   MCHC 30.9 30.0 - 36.0 g/dL   RDW 19.0 (H) 11.5 - 15.5 %   Platelets 228 150 - 400 K/uL   nRBC 0.0 0.0 - 0.2 %    Comment: Performed at [facility], [place] [address], [place], [place] [address]  Magnesium     Status: None   Collection Time: 01/23/20  4:23 AM  Result Value Ref Range   Magnesium 1.9  1.7 - 2.4 mg/dL    Comment: Performed at [facility], [place] [address] [place], [place] [address]  Potassium     Status: None   Collection Time: 01/23/20  4:23 AM  Result Value Ref Range   Potassium 3.8 3.5 - 5.1 mmol/L    Comment: Performed at [facility], [place] [address] [place], [place] [address]  Creatinine, serum     Status: None   Collection Time: 01/23/20  4:23 AM  Result Value Ref Range   Creatinine, Ser 0.79 0.44 - 1.00 mg/dL   GFR calc non Af Amer >60 >60 mL/min   GFR calc Af Amer >60 >60 mL/min    Comment: Performed at [facility], [place] [address] [place], [place] [address]    Labs:  CBC: Recent Labs  Lab 01/18/20 1538 01/23/20 0423  WBC 10.3 5.0  HGB 13.9 8.6*  HCT 45.1 27.8*  MCV 80.7 81.3  PLT 296 [id number]    Basic Metabolic Panel: Recent Labs  Lab 01/18/20 1538 01/23/20 0423  NA 140  --   K 3.4* 3.8  CL 104  --   CO2 25  --   GLUCOSE 139*  --   BUN 15  --   CREATININE 0.93 0.79  CALCIUM 9.9  --   MG  --  1.9   GFR Estimated Creatinine Clearance: 68.3 mL/min (by C-G formula based on SCr of 0.79 mg/dL). Liver Function Tests: Recent Labs  Lab 01/18/20 1538  AST 14*  ALT 14  ALKPHOS 64   BILITOT 0.9  PROT 7.4  ALBUMIN 4.4   Recent Labs  Lab 01/18/20 1538  LIPASE 33   No results for input(s): AMMONIA in the last 168 hours. Coagulation profile No results for input(s): INR, PROTIME in the last 168 hours.  Cardiac Enzymes: No results for input(s): CKTOTAL, CKMB, CKMBINDEX, TROPONINI in the last 168 hours. BNP: Invalid input(s): POCBNP CBG: No results for input(s): GLUCAP in the last 168 hours. D-Dimer No results for input(s): DDIMER in the last 72 hours. Hgb A1c No results for input(s): HGBA1C in the last 72 hours. Lipid Profile No results for input(s): CHOL, HDL, LDLCALC, TRIG, CHOLHDL, LDLDIRECT in the last 72 hours. Thyroid function studies Recent Labs    01/21/20 0444  TSH 0.754   Anemia work up Recent Labs    01/21/20 0444  VITAMINB12 228  FOLATE 2.7*  FERRITIN 42   Urinalysis    Component Value Date/Time   COLORURINE STRAW (A) 08/13/2019 1434   APPEARANCEUR CLEAR 08/13/2019 1434   LABSPEC 1.013 08/13/2019 1434   [place] 7.0 08/13/2019 1434   GLUCOSEU NEGATIVE 08/13/2019 1434   [place] 08/13/2019 1434   [place] 08/13/2019 1434   KETONESUR NEGATIVE 08/13/2019 1434   PROTEINUR NEGATIVE 08/13/2019 1434   UROBILINOGEN 1.0 08/08/2012 0218   NITRITE NEGATIVE 08/13/2019 1434   LEUKOCYTESUR NEGATIVE 08/13/2019 1434    Microbiology Recent Results (from the past 240 hour(s))  SARS Coronavirus 2 by RT PCR (hospital order, performed in [place] hospital lab) Nasopharyngeal Nasopharyngeal Swab     Status: None   Collection Time: 01/18/20  6:59 PM   Specimen: Nasopharyngeal Swab  Result Value Ref Range Status   SARS Coronavirus 2 NEGATIVE NEGATIVE Final    Comment: (NOTE) SARS-CoV-2 target nucleic acids are NOT DETECTED. The SARS-CoV-2 RNA is generally detectable in upper and lower respiratory specimens during the acute phase of infection. The lowest concentration of SARS-CoV-2 viral copies this  assay can detect is  250 copies / mL. A negative result does not preclude SARS-CoV-2 infection and should not be used as the sole basis for treatment or other patient management decisions.  A negative result may occur with improper specimen collection / handling, submission of specimen other than nasopharyngeal swab, presence of viral mutation(s) within the areas targeted by this assay, and inadequate number of viral copies (<250 copies / mL). A negative result must be combined with clinical observations, patient history, and epidemiological information. Fact Sheet for Patients:   StrictlyIdeas.no Fact Sheet for Healthcare Providers: BankingDealers.co.za This test is not yet approved or cleared  by the [place] FDA and has been authorized for detection and/or diagnosis of SARS-CoV-2 by FDA under an Emergency Use Authorization (EUA).  This EUA will remain in effect (meaning this test can be used) for the duration of the COVID-19 declaration under Section 564(b)(1) of the Act, 21 U.S.C. section 360bbb-3(b)(1), unless the authorization is terminated or revoked sooner. Performed at [facility], [place] [address], [place], [place] [address]   MRSA PCR Screening     Status: None   Collection Time: 01/19/20 12:48 PM   Specimen: Nasal Mucosa; Nasopharyngeal  Result Value Ref Range Status   MRSA by PCR NEGATIVE NEGATIVE Final    Comment:        The GeneXpert MRSA Assay (FDA approved for NASAL specimens only), is one component of a comprehensive MRSA colonization surveillance program. It is not intended to diagnose MRSA infection nor to guide or monitor treatment for MRSA infections. Performed at [facility], [place] [address] [place], [place] [address]     Inpatient Medications:   Scheduled Meds: . acetaminophen  1,000 mg Oral Q6H  . amLODipine  10 mg Oral Daily  . busPIRone  5 mg Oral TID  . dicyclomine  10 mg  Oral TID AC  . enoxaparin (LOVENOX) injection  40 mg Subcutaneous Q24H  . famotidine  40 mg Oral Daily  . FLUoxetine  10 mg Oral Daily  . folic acid  1 mg Oral Daily  . iron polysaccharides  150 mg Oral Daily  . polyethylene glycol  17 g Oral BID  . sodium chloride flush  3 mL Intravenous Once  . sodium chloride flush  3 mL Intravenous Q12H   PRN Meds: sodium chloride, alum & mag hydroxide-simeth, bisacodyl, diphenhydrAMINE **OR** diphenhydrAMINE, hydrALAZINE, HYDROmorphone (DILAUDID) injection, ibuprofen, labetalol, lactated ringers, magic mouthwash, methocarbamol (ROBAXIN) IV, ondansetron **OR** ondansetron (ZOFRAN) IV, polyethylene glycol, simethicone, sodium chloride flush, traMADol, zolpidem Continuous Infusions: . sodium chloride    . iron dextran (INFED/DEXFERRUM) infusion     Followed by  . iron dextran (INFED/DEXFERRUM) infusion    . lactated ringers    . methocarbamol (ROBAXIN) IV      Radiological Exams on Admission: No results found.  Assessment/Plan:  Hypertension -Patient endorses being off of lisinopril/HCTZ for approximately 2 weeks prior to this hospitalization.  GI had wanted patient off of lisinopril given possible concern for intestinal angioedema given difficulty explaining her lingering abdominal symptoms for several months now.  It should also be noted that repeat CT abdomen/pelvis on 01/14/2020 also revealed resolution of her intestinal inflammation that was the concern - checking for other etiologies and risk factor assessment (last TSH was low normal). Check FT4, TT3. Check lipid and A1c -At this time, will hold off on resuming lisinopril/HCTZ -Will initiate trial of amlodipine 10 mg daily and assess response.  If needing to be  discharged, she could have close follow-up with her PCP.  She may still warrant a second agent, and HCTZ or chlorthalidone would be still appropriate -Use labetalol or hydralazine as needed while hospitalized (already ordered).  If  needed, this would suggest a second agent required -Obtain repeat EKG now; previous EKGs reviewed from 2019 and 2020.  No evidence of LVH, does have persistent sinus tachycardia noted on these EKGs as well historically at her PCP OV's.  No need for echo at this time, can be considered outpatient with PCP -Other considered etiologies are sleep apnea.  Patient does endorse history of snoring but is not told she stops breathing.  However hypertension during this hospitalization more likely related to being off of home meds coming into the hospitalization.  Would resume medications and assess response  Sinus tachycardia - pain is controlled, this is less likely to be contributing; given long standing outpatient history of sinus tach, she is at baseline and would continue to control any underlying treatable disease (HTN, anxiety, depression as we are doing) - also, her anemia may be contributing to some degree; she is historically deficient on B12, folate, and iron; would continue supplementation with these and also told patient this  - follow up EKG  Depression/anxiety - resume home prozac and buspar; patient is amenable to this - d/c mirtazapine    Thank you for this consultation.  Our [facility] hospitalist team will follow the patient with you.   Time Spent: 40 minutes   Dwyane Dee M.D. [facility] 01/23/2020, 10:12 AM Pager: Secure chat

## 2020-01-23 NOTE — Progress Notes (Addendum)
BRECK CHRISLEY [id number] 1961/04/20  CARE TEAM:  PCP: Lorrene Reid, PA-C  Outpatient Care Team: Patient Care Team: Lorrene Reid, PA-C as PCP - General (Physician Assistant) Druscilla Brownie, MD as Consulting Physician (Dermatology) Opthamology, Lady Gary (Ophthalmology) Princess Bruins, MD as Consulting Physician (Obstetrics and Gynecology) Melrose Nakayama, MD as Consulting Physician (Orthopedic Surgery)  Inpatient Treatment Team: Treatment Team: Attending Provider: Edison Pace, Md, MD; Technician: Leda Quail, NT; Registered Nurse: Lyndal Pulley, RN; Technician: Lewanda Rife, NT; Registered Nurse: Sunny Schlein, RN; Registered Nurse: Guadlupe Spanish, RN   Problem List:   Active Problems:   SBO (small bowel obstruction) ([place])   4 Days Post-Op  01/19/2020  POST-OPERATIVE DIAGNOSIS:   1. Retained capsule endoscope in mid ileum 2. 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 Laparoscopic-assisted small bowel resection 2. Diagnostic laparoscopy 3. Bilateral transversus abdominus plane blocks  SURGEON:  Verta Mt. White, MD  PATHOLOGY  A. SEGMENT OF ILEUM, RESECTION:  - Benign small bowel. Transmural incision present grossly.   B. CAPSULE ENDOSCOPE, EXPLANTATION:  - Consistent with clinically stated capsule endoscope. Mieczyslaw Stamas  examination only.  Assessment  Recovering  [facility] Stay = 5 days)   Assessment/Plan  s/p lap assisted SBR for retained capsule endoscope, Dr. Dema Severin, 5/19 Heart healthy diet.  Bowel regimen   Pathology benign.  No tumor.    Acute postoperative on top of chronic anemia.  Replace folate.  Continue oral iron.  We will give 1 dose of IV iron.  Hold off any transfusion since no evidence of orthostasis.  No strong evidence of orthostasis.  Intermittent hypertension and tachycardia of uncertain etiology.  She does has a history of depression anxiety.  Seems to normalize if she is consoled.   Continue low-dose metoprolol.    Resuming Prozac with her depression anxiety and stressors from prior surgeries and death of husband last year.  Patient notes she is walks normally but then does confess that she needed a walker or cane after hip replacement done 5 months ago.  She was concerned that the capsule had caused right hip issues.  That is not possible.  Work with physical therapy.    We will ask [facility] to help evaluate with her persistent intermittent tachycardia, borderline hypertension and depression with intermittent compliance with medications   Check urinalysis with some concerns of urinary incontinence and other issues.  I do not see that has been collected.  Can I&O cath if needed.   Hopefully this is self-limiting but discussed with patient that this may something she will need to have evaluated by PCP vs urology as an outpatient if it continues.  Medicine consultation.  FEN -soft diet, SLIV VTE -lovenox ID -none needed  25 minutes spent in review, evaluation, examination, counseling, and coordination of care.  More than 50% of that time was spent in counseling.  01/23/2020    Subjective: (Chief complaint)  Patient claims to feel little better today.  Note she was walking normally but after discussion does confess she needed a walker and cane after hip replacement only 5 months ago.  Less nausea.  Not much of an appetite.  No hematochezia.  Still with intermittent episodes of tachycardia.  No orthostasis.  Objective:  Vital signs:  Vitals:   01/23/20 0536 01/23/20 0538 01/23/20 0540 01/23/20 0754  BP: (!) 157/91 (!) 149/98 (!) 142/100 (!) 155/93  Pulse: 97 (!) 105 (!) 124 88  Resp: 16 16 18 17   Temp: 98.3 F (36.8  C)   98.1 F (36.7 C)  TempSrc: Oral   Oral  SpO2: 98% 99% 98% 100%  Weight:      Height:        Last BM Date: 01/10/20  Intake/Output   Yesterday:  05/22 0701 - 05/23 0700 In: 520 [P.O.:520] Out: 1000  [Urine:1000] This shift:  No intake/output data recorded.  Bowel function:  Flatus: YES  BM:  No  Drain: (No drain)   Physical Exam:  General: Pt awake/alert in no acute distress.  More alert this morning.   Eyes: PERRL, normal EOM.  Sclera clear.  No icterus Neuro: CN II-XII intact w/o focal sensory/motor deficits. Lymph: No head/neck/groin lymphadenopathy Psych:  No delerium/psychosis/paranoia.  Oriented x 4 HENT: Normocephalic, Mucus membranes moist.  No thrush Neck: Supple, No tracheal deviation.  No obvious thyromegaly Chest: No pain to chest wall compression.  Good respiratory excursion.  No audible wheezing CV:  Pulses intact.  Regular rhythm.  No major extremity edema MS: Normal AROM mjr joints.  No obvious deformity  Abdomen: Soft.  Nondistended.  Mildly tender at incisions only.  Incisions clean dry and intact.  No evidence of peritonitis.  No incarcerated hernias.  Ext:   No deformity.  No mjr edema.  No cyanosis Skin: No petechiae / purpurea.  No major sores.  Warm and dry    Results:   SURGICAL PATHOLOGY  CASE: [id number]  PATIENT: Barnetta Hammersmith  Surgical Pathology Report      Clinical History: Small bowel obstruction      FINAL MICROSCOPIC DIAGNOSIS:   A. SEGMENT OF ILEUM, RESECTION:  - Benign small bowel. Transmural incision present grossly.   B. CAPSULE ENDOSCOPE, EXPLANTATION:  - Consistent with clinically stated capsule endoscope. Lissandro Dilorenzo  examination only.   Cherly Erno DESCRIPTION:   A: Received fresh is an 11 cm segment of small intestine, clinically  ileum, with a suture attached identifying the distal end. The margins  are stapled. There is a 2 cm previous transmural incision located 1 cm  from the proximal margin. The serosa is smooth and tan. The mucosa is  glistening and tan. There are no discrete masses. Sections are  submitted in 3 cassettes.  1 = proximal margin.  2 = distal margin.  3 = representative sections.   B:  Received fresh is a medical device consistent with clinically stated  capsule endoscope. The capsule measures 3 cm in length and 1.2 cm in  diameter. The ends of the capsule are rounded. The external surface is  smooth. No tissue is present. No sections are submitted. [facility]  01/20/2020)     Final Diagnosis performed by Gillie Manners, MD.  Electronically  signed 01/21/2020  Technical component performed at [facility], [place]  [address] [place], [place] [address].  Professional component performed at [employer]. [facility],  [place] [address], [place], [place] [address].  Immunohistochemistry Technical component (if applicable) was performed  at [facility]. [address], [place],  [place],  [address].  IMMUNOHISTOCHEMISTRY DISCLAIMER (if applicable):  Some of these immunohistochemical stains may have been developed and the  performance characteristics determine by [facility]. Some  may not have been cleared or approved by the U.S. Food and Drug  Administration. The FDA has determined that such clearance or approval  is not necessary. This test is used for clinical purposes. It should not  be regarded as investigational or for research. This laboratory is  certified under the [place]  Laboratory Improvement Amendments of 1988  (CLIA-88) as qualified to perform high complexity clinical laboratory  testing. The controls stained appropriately  Cultures: Recent Results (from the past 720 hour(s))  SARS Coronavirus 2 by RT PCR (hospital order, performed in [facility] hospital lab) Nasopharyngeal Nasopharyngeal Swab     Status: None   Collection Time: 01/18/20  6:59 PM   Specimen: Nasopharyngeal Swab  Result Value Ref Range Status   SARS Coronavirus 2 NEGATIVE NEGATIVE Final    Comment: (NOTE) SARS-CoV-2 target nucleic acids are NOT DETECTED. The SARS-CoV-2 RNA is generally detectable in upper and  lower respiratory specimens during the acute phase of infection. The lowest concentration of SARS-CoV-2 viral copies this assay can detect is 250 copies / mL. A negative result does not preclude SARS-CoV-2 infection and should not be used as the sole basis for treatment or other patient management decisions.  A negative result may occur with improper specimen collection / handling, submission of specimen other than nasopharyngeal swab, presence of viral mutation(s) within the areas targeted by this assay, and inadequate number of viral copies (<250 copies / mL). A negative result must be combined with clinical observations, patient history, and epidemiological information. Fact Sheet for Patients:   StrictlyIdeas.no Fact Sheet for Healthcare Providers: BankingDealers.co.za This test is not yet approved or cleared  by the [place] FDA and has been authorized for detection and/or diagnosis of SARS-CoV-2 by FDA under an Emergency Use Authorization (EUA).  This EUA will remain in effect (meaning this test can be used) for the duration of the COVID-19 declaration under Section 564(b)(1) of the Act, 21 U.S.C. section 360bbb-3(b)(1), unless the authorization is terminated or revoked sooner. Performed at [facility], [place] [address], [place], [place] [address]   MRSA PCR Screening     Status: None   Collection Time: 01/19/20 12:48 PM   Specimen: Nasal Mucosa; Nasopharyngeal  Result Value Ref Range Status   MRSA by PCR NEGATIVE NEGATIVE Final    Comment:        The GeneXpert MRSA Assay (FDA approved for NASAL specimens only), is one component of a comprehensive MRSA colonization surveillance program. It is not intended to diagnose MRSA infection nor to guide or monitor treatment for MRSA infections. Performed at [facility], [place] [address], [place], [place] [address]     Labs: Results for  orders placed or performed during the hospital encounter of 01/18/20 (from the past 48 hour(s))  CBC     Status: Abnormal   Collection Time: 01/23/20  4:23 AM  Result Value Ref Range   WBC 5.0 4.0 - 10.5 K/uL   RBC 3.42 (L) 3.87 - 5.11 MIL/uL   Hemoglobin 8.6 (L) 12.0 - 15.0 g/dL   HCT 27.8 (L) 36.0 - 46.0 %   MCV 81.3 80.0 - 100.0 fL   MCH 25.1 (L) 26.0 - 34.0 pg   MCHC 30.9 30.0 - 36.0 g/dL   RDW 19.0 (H) 11.5 - 15.5 %   Platelets 228 150 - 400 K/uL   nRBC 0.0 0.0 - 0.2 %    Comment: Performed at [facility], [place] [address] [place], [place] [address]  Magnesium     Status: None   Collection Time: 01/23/20  4:23 AM  Result Value Ref Range   Magnesium 1.9 1.7 - 2.4 mg/dL    Comment: Performed at [facility], [place] [address] [place], [place] [address]  Potassium     Status:  None   Collection Time: 01/23/20  4:23 AM  Result Value Ref Range   Potassium 3.8 3.5 - 5.1 mmol/L    Comment: Performed at [facility], [place] [address] [place], [place] [address]  Creatinine, serum     Status: None   Collection Time: 01/23/20  4:23 AM  Result Value Ref Range   Creatinine, Ser 0.79 0.44 - 1.00 mg/dL   GFR calc non Af Amer >60 >60 mL/min   GFR calc Af Amer >60 >60 mL/min    Comment: Performed at [facility], [place] [address] [place], [place] [address]    Imaging / Studies: No results found.  Medications / Allergies: per chart  Antibiotics: Anti-infectives (From admission, onward)   Start     Dose/Rate Route Frequency Ordered Stop   01/19/20 1100  cefoTEtan (CEFOTAN) 2 g in sodium chloride 0.9 % 100 mL IVPB     2 g 200 mL/hr over 30 Minutes Intravenous On call to O.R. 01/19/20 1058 01/19/20 1506        Note: Portions of this report may have been transcribed using voice recognition software. Every effort was made to ensure accuracy; however, inadvertent computerized transcription errors may be present.    Any transcriptional errors that result from this process are unintentional.    Adin Hector, MD, FACS, MASCRS Gastrointestinal and Minimally Invasive Surgery  [facility] [address] [place], [place] [address] [phone] Fax [phone] Main/Paging  CONTACT INFORMATION: Weekday (9AM-5PM) concerns: Call [facility] main office at [phone] Weeknight (5PM-9AM) or Weekend/Holiday concerns: Check [email or web address] for General Surgery [facility] coverage (Please, do not use SecureChat as it is not reliable communication to surgeons for patient care)      01/23/2020  8:12 AM

## 2020-01-23 NOTE — Progress Notes (Signed)
Pt vomited 100 cc today after breakfast. She received Zofran IV and felt better. Will continue to monitor.

## 2020-01-24 ENCOUNTER — Inpatient Hospital Stay (HOSPITAL_COMMUNITY): Payer: BC Managed Care – PPO

## 2020-01-24 ENCOUNTER — Encounter (HOSPITAL_COMMUNITY): Payer: Self-pay | Admitting: Surgery

## 2020-01-24 DIAGNOSIS — R109 Unspecified abdominal pain: Secondary | ICD-10-CM

## 2020-01-24 DIAGNOSIS — G911 Obstructive hydrocephalus: Secondary | ICD-10-CM

## 2020-01-24 DIAGNOSIS — D638 Anemia in other chronic diseases classified elsewhere: Secondary | ICD-10-CM

## 2020-01-24 DIAGNOSIS — D649 Anemia, unspecified: Secondary | ICD-10-CM

## 2020-01-24 LAB — CBC
HCT: 29.2 % — ABNORMAL LOW (ref 36.0–46.0)
HCT: 33.8 % — ABNORMAL LOW (ref 36.0–46.0)
Hemoglobin: 8.8 g/dL — ABNORMAL LOW (ref 12.0–15.0)
Hemoglobin: 10.3 g/dL — ABNORMAL LOW (ref 12.0–15.0)
MCH: 25 pg — ABNORMAL LOW (ref 26.0–34.0)
MCH: 24.8 pg — ABNORMAL LOW (ref 26.0–34.0)
MCHC: 30.1 g/dL (ref 30.0–36.0)
MCHC: 30.5 g/dL (ref 30.0–36.0)
MCV: 83 fL (ref 80.0–100.0)
MCV: 81.4 fL (ref 80.0–100.0)
Platelets: 235 10*3/uL (ref 150–400)
Platelets: 334 10*3/uL (ref 150–400)
RBC: 3.52 MIL/uL — ABNORMAL LOW (ref 3.87–5.11)
RBC: 4.15 MIL/uL (ref 3.87–5.11)
RDW: 18.8 % — ABNORMAL HIGH (ref 11.5–15.5)
RDW: 18.5 % — ABNORMAL HIGH (ref 11.5–15.5)
WBC: 4.1 10*3/uL (ref 4.0–10.5)
WBC: 6 10*3/uL (ref 4.0–10.5)
nRBC: 0 % (ref 0.0–0.2)
nRBC: 0 % (ref 0.0–0.2)

## 2020-01-24 LAB — BASIC METABOLIC PANEL
Anion gap: 8 (ref 5–15)
BUN: 10 mg/dL (ref 6–20)
CO2: 26 mmol/L (ref 22–32)
Calcium: 8.5 mg/dL — ABNORMAL LOW (ref 8.9–10.3)
Chloride: 107 mmol/L (ref 98–111)
Creatinine, Ser: 0.81 mg/dL (ref 0.44–1.00)
GFR calc Af Amer: >60 mL/min (ref >60)
GFR calc non Af Amer: >60 mL/min (ref >60)
Glucose, Bld: 98 mg/dL (ref 70–99)
Potassium: 3.6 mmol/L (ref 3.5–5.1)
Sodium: 141 mmol/L (ref 135–145)

## 2020-01-24 LAB — GLUCOSE, CAPILLARY
Glucose-Capillary: 123 mg/dL — ABNORMAL HIGH (ref 70–99)
Glucose-Capillary: 181 mg/dL — ABNORMAL HIGH (ref 70–99)

## 2020-01-24 LAB — CREATININE, SERUM
Creatinine, Ser: 0.92 mg/dL (ref 0.44–1.00)
GFR calc Af Amer: >60 mL/min (ref >60)
GFR calc non Af Amer: >60 mL/min (ref >60)

## 2020-01-24 LAB — TISSUE TRANSGLUTAMINASE, IGA: Tissue Transglutaminase Ab, IgA: <2 U/mL (ref 0–3)

## 2020-01-24 LAB — LIPASE, BLOOD: Lipase: 26 U/L (ref 11–51)

## 2020-01-24 LAB — T3: T3, Total: 112 ng/dL (ref 71–180)

## 2020-01-24 LAB — MRSA PCR SCREENING: MRSA by PCR: NEGATIVE

## 2020-01-24 LAB — MAGNESIUM: Magnesium: 2.2 mg/dL (ref 1.7–2.4)

## 2020-01-24 MED ORDER — GADOBUTROL 1 MMOL/ML IV SOLN
5.0000 mL | Freq: Once | INTRAVENOUS | Status: AC | PRN
  Administered 2020-01-24: 5 mL via INTRAVENOUS

## 2020-01-24 MED ORDER — DEXAMETHASONE SODIUM PHOSPHATE 4 MG/ML IJ SOLN
4.0000 mg | Freq: Four times a day (QID) | INTRAMUSCULAR | Status: DC
  Administered 2020-01-24 – 2020-01-26 (×6): 4 mg via INTRAVENOUS
  Filled 2020-01-24 (×6): qty 1

## 2020-01-24 MED ORDER — HEPARIN SODIUM (PORCINE) 5000 UNIT/ML IJ SOLN
5000.0000 [IU] | Freq: Three times a day (TID) | INTRAMUSCULAR | Status: DC
  Administered 2020-01-24 – 2020-01-26 (×6): 5000 [IU] via SUBCUTANEOUS
  Filled 2020-01-24 (×6): qty 1

## 2020-01-24 MED ORDER — CYANOCOBALAMIN 1000 MCG PO TABS: 1000.0000 ug | ORAL_TABLET | Freq: Every day | ORAL | 3 refills | Status: DC

## 2020-01-24 MED ORDER — POLYSACCHARIDE IRON COMPLEX 150 MG PO CAPS: 150.0000 mg | ORAL_CAPSULE | Freq: Every day | ORAL | 3 refills | Status: DC

## 2020-01-24 MED ORDER — FOLIC ACID 1 MG PO TABS: 1.0000 mg | ORAL_TABLET | Freq: Every day | ORAL | 3 refills | Status: DC

## 2020-01-24 MED ORDER — DEXAMETHASONE SODIUM PHOSPHATE 10 MG/ML IJ SOLN
10.0000 mg | Freq: Once | INTRAMUSCULAR | Status: AC
  Administered 2020-01-24: 10 mg via INTRAVENOUS
  Filled 2020-01-24: qty 1

## 2020-01-24 MED ORDER — METOPROLOL TARTRATE 25 MG PO TABS
25.0000 mg | ORAL_TABLET | Freq: Two times a day (BID) | ORAL | Status: DC
  Administered 2020-01-24 – 2020-01-27 (×7): 25 mg via ORAL
  Filled 2020-01-24 (×8): qty 1

## 2020-01-24 MED ORDER — METOPROLOL TARTRATE 25 MG PO TABS: 25.0000 mg | ORAL_TABLET | Freq: Two times a day (BID) | ORAL | 3 refills | Status: DC

## 2020-01-24 MED ORDER — INSULIN ASPART 100 UNIT/ML ~~LOC~~ SOLN
1.0000 [IU] | SUBCUTANEOUS | Status: DC
  Administered 2020-01-24: 2 [IU] via SUBCUTANEOUS
  Administered 2020-01-24: 1 [IU] via SUBCUTANEOUS
  Administered 2020-01-25 (×4): 2 [IU] via SUBCUTANEOUS
  Administered 2020-01-26 (×2): 1 [IU] via SUBCUTANEOUS
  Administered 2020-01-26 (×2): 2 [IU] via SUBCUTANEOUS
  Administered 2020-01-26 – 2020-01-30 (×8): 1 [IU] via SUBCUTANEOUS

## 2020-01-24 MED ORDER — BUSPIRONE HCL 5 MG PO TABS: 5.0000 mg | ORAL_TABLET | Freq: Three times a day (TID) | ORAL | 0 refills | Status: DC

## 2020-01-24 MED ORDER — CHLORHEXIDINE GLUCONATE CLOTH 2 % EX PADS
6.0000 | MEDICATED_PAD | Freq: Every day | CUTANEOUS | Status: DC
  Administered 2020-01-24 – 2020-02-03 (×9): 6 via TOPICAL

## 2020-01-24 NOTE — Consult Note (Signed)
NAME:  Cristina Conner, MRN:  [id number], DOB:  Jul 11, 1961, LOS: 6 ADMISSION DATE:  01/18/2020, CONSULTATION DATE:  5/24 REFERRING MD:  Dr Tana Coast, CHIEF COMPLAINT:  Obstructive hydrocephalus   Brief History   59 year old female admitted to [facility] for partial SBO following retained capsule endoscopy. She developed unilateral weakness and was discovered to have hydrocephalus with mass effect. Felt to strongly correlate with brain mass. Transferred to [facility] for ICU admission and neurosurgery eval.   History of present illness   59 year old female with PMH as below, who was admitted to [facility] on 5/18. She had a several month history of abdominal pain, nausea, and vomiting, for which she had been followed by [facility]. Etiology remained unclear despite symptomatic treatment and endoscopy on 4/12 with no acute findings. They proceeded with capsule endoscopy on 4/27, but did not pass. It was not found on colonoscopy 5/14. CT that day demonstrated capsule in the distal ileum. She presented to [facility] ED with complaints of nausea/vomiting and abdominal pain. She was admitted to the surgical service and underwent laparoscopic- assisted small bowel resection for capsule retrieval. The post-operative course was initially uncomplicated with the exclusion of some weakness with PT, which ultimately lead to her not being discharged. Course then became complicated by hypertension with associated blurry vision. While working with PT there were some unilateral weakness/ ambulation issues noted and she was taken for CT head. This showed hydrocephalus with mass effect on the fourth ventricle. Neurosurgery was involved and the patient was transferred to [facility] ICU for further evaluation.   Past Medical History   has a past medical history of Anxiety, Arthritis, Diverticulosis, GERD (gastroesophageal reflux disease), Hypertension, Osteopenia, Post-operative nausea and vomiting, and Pre-diabetes.  Significant  Hospital Events   5/18 admit with partial SBO and retained capsule endoscopy.  5/19 lap-assisted small bowel resection for retrieval of capsule.  5/24 gait abnormality prompted head CT concerning for mass. Transfer to [facility].   Consults:  Surgery Neurosurgery GI PCCM  Procedures:  5/19 lap-assisted small bowel resection for retrieval of capsule.   Significant Diagnostic Tests:  CT head 5/24 > Moderate hydrocephalus. No prior studies for comparison. Transependymal resorption of CSF in the white matter due to hydrocephalus There is mass-effect on the posterior fossa on the left. Fourth ventricle is displaced to the right and distorted.  MRI brain 5/24 >  Micro Data:  none  Antimicrobials:   none  Interim history/subjective:  Transferred today from [facility]  Objective   Blood pressure (!) 149/93, pulse 75, temperature 97.9 F (36.6 C), temperature source Oral, resp. rate 16, height 5\' 6"  (1.676 m), weight 55.8 kg, last menstrual period 05/01/2009, SpO2 100 %.        Intake/Output Summary (Last 24 hours) at 01/24/2020 1606 Last data filed at 01/24/2020 0900 Gross per 24 hour  Intake 1428.32 ml  Output 1950 ml  Net -521.68 ml   Filed Weights   01/18/20 1359 01/18/20 2006  Weight: 55.8 kg 55.8 kg    Examination: General: thin woman, appropriately interactive.  HENT: no scleral icterus. Mallampati 1 airway. Normal TM and HM distance with normal range of motion, no thyromegaly Lungs: chest clear, no tracheal tug.  Cardiovascular: HS normal with edema.  Abdomen: soft and non-tender. Extremities: no rashes or active joints. Neuro: no focal deficits.    Resolved Hospital Problem list   n/a  Assessment & Plan:   Obstructive hydrocephalus: most likely due to mass effect  on the fourth ventricle in the setting of suspected brain mass. - [facility] to [facility] - Neurosurgery to see upon arrival - Decadron - MRI pending   Small bowel obstruction: secondary to retained capsule  endoscopy, s/p small bowel resection and retrieval non 5/19 - management per surgery  Hypertension - Continue lopressor 25mg  BID - No ACE-I due to possible GI angioedema, she has also requested to come off norvasc.   Anemia: Anemia panel showed ferritin 42, folate 2.7, borderline [id number] - Folic acid 1mg  daily, B12 1081mcg daily, Iron supplementation  Depression: Following the death of her husband in 04-22-19 - Continue fluoxetine, buspirone  Preoperative assessment for anticipated neurosurgery:  Currently medically optimized for surgery: Cardiac risk - very low risk 0.4% ([facility] - [facility]) Pulmonary risk - moderate 13.3% (ARISCAT) DVT risk - high risk Pollyann Samples)  Best practice:  Diet: DAT Pain/Anxiety/Delirium protocol (if indicated): prn only VAP protocol (if indicated): not intubated. Mobilize preop DVT prophylaxis: heparin tid. GI prophylaxis: n/a Glucose control: SSI while on decadron Mobility: PT for prehabilitation. Code Status: Full. Family Communication: patient advised. Disposition:   Labs   CBC: Recent Labs  Lab 01/18/20 1538 01/23/20 0423 01/24/20 0346  WBC 10.3 5.0 4.1  HGB 13.9 8.6* 8.8*  HCT 45.1 27.8* 29.2*  MCV 80.7 81.3 83.0  PLT 296 228 [id number]    Basic Metabolic Panel: Recent Labs  Lab 01/18/20 1538 01/23/20 0423 01/24/20 0346  NA 140  --  141  K 3.4* 3.8 3.6  CL 104  --  107  CO2 25  --  26  GLUCOSE 139*  --  98  BUN 15  --  10  CREATININE 0.93 0.79 0.81  CALCIUM 9.9  --  8.5*  MG  --  1.9 2.2   GFR: Estimated Creatinine Clearance: 67.5 mL/min (by C-G formula based on SCr of 0.81 mg/dL). Recent Labs  Lab 01/18/20 1538 01/18/20 1540 01/23/20 0423 01/24/20 0346  WBC 10.3  --  5.0 4.1  LATICACIDVEN  --  1.7  --   --     Liver Function Tests: Recent Labs  Lab 01/18/20 1538  AST 14*  ALT 14  ALKPHOS 64  BILITOT 0.9  PROT 7.4  ALBUMIN 4.4   Recent Labs  Lab 01/18/20 1538 01/23/20 1030 01/24/20 0346  LIPASE 33 26 26   No  results for input(s): AMMONIA in the last 168 hours.  ABG No results found for: PHART, PCO2ART, PO2ART, HCO3, TCO2, ACIDBASEDEF, O2SAT   Coagulation Profile: No results for input(s): INR, PROTIME in the last 168 hours.  Cardiac Enzymes: No results for input(s): CKTOTAL, CKMB, CKMBINDEX, TROPONINI in the last 168 hours.  HbA1C: Hgb A1c MFr Bld  Date/Time Value Ref Range Status  01/23/2020 09:39 AM 5.4 4.8 - 5.6 % Final    Comment:    (NOTE) Pre diabetes:          5.7%-6.4% Diabetes:              >6.4% Glycemic control for   <7.0% adults with diabetes   08/13/2019 03:00 PM 5.7 (H) 4.8 - 5.6 % Final    Comment:    (NOTE) Pre diabetes:          5.7%-6.4% Diabetes:              >6.4% Glycemic control for   <7.0% adults with diabetes     CBG: No results for input(s): GLUCAP in the last 168 hours.  Review of Systems:  Review of Systems  Constitutional: Negative.   HENT: Positive for hearing loss.   Eyes: Negative.   Respiratory: Negative.   Cardiovascular: Negative.   Gastrointestinal: Positive for nausea and vomiting.  Genitourinary: Negative.   Musculoskeletal: Negative.   Skin: Negative.   Neurological: Positive for dizziness.  Endo/Heme/Allergies: Negative.   Psychiatric/Behavioral: Negative.      Past Medical History  She,  has a past medical history of Anxiety, Arthritis, Diverticulosis, GERD (gastroesophageal reflux disease), Hypertension, Osteopenia, Post-operative nausea and vomiting, and Pre-diabetes.   Surgical History    Past Surgical History:  Procedure Laterality Date  . CESAREAN SECTION     x 2  . COLONOSCOPY    . ENDOMETRIAL ABLATION    . ESOPHAGOGASTRODUODENOSCOPY  10/2019  . LAPAROSCOPY N/A 01/19/2020   Procedure: LAPAROSCOPY DIAGNOSTIC LAPAROTOMY WITH SMALL BOWEL RESECTION;  Surgeon: Ileana Roup, MD;  Location: [facility];  Service: General;  Laterality: N/A;  . PELVIC LAPAROSCOPY  1999   left salpingectomy, excision of Right,  paratubal cyst  . PELVIC LAPAROSCOPY     laparoscopy with lysis of pelvic adhesions  . SHOULDER SURGERY  11/2010   "FROZEN SHOULDER"  . TOTAL HIP ARTHROPLASTY Left 08/17/2019   Procedure: LEFT TOTAL HIP ARTHROPLASTY ANTERIOR APPROACH;  Surgeon: Melrose Nakayama, MD;  Location: [facility];  Service: Orthopedics;  Laterality: Left;  . TUBAL LIGATION    . UPPER GASTROINTESTINAL ENDOSCOPY       Social History   reports that she has never smoked. She has never used smokeless tobacco. She reports previous alcohol use. She reports that she does not use drugs.   Family History   Her family history includes Diabetes in her mother; Heart disease in her mother; Hypertension in her father and mother. There is no history of Colon cancer, Esophageal cancer, Stomach cancer, Rectal cancer, or Colon polyps.   Allergies Allergies  Allergen Reactions  . Hydrocodone Nausea And Vomiting     Home Medications  Prior to Admission medications   Medication Sig Start Date End Date Taking? Authorizing Provider  acetaminophen (TYLENOL) 500 MG tablet Take 1,000 mg by mouth as needed for moderate pain.   Yes [provider]  dicyclomine (BENTYL) 10 MG capsule Take 1 capsule (10 mg total) by mouth 3 (three) times daily before meals. 11/29/19  Yes Esterwood, Amy S, PA-C  eszopiclone (LUNESTA) 1 MG TABS tablet Take 1 mg by mouth at bedtime as needed for sleep.  10/15/19  Yes [provider]  famotidine (PEPCID) 40 MG tablet Take 40 mg by mouth daily.  12/17/19  Yes [provider]  Fe Fum-FePoly-Vit C-Vit B3 (INTEGRA) 62.5-62.5-40-3 MG CAPS Take 62.5 mg by mouth daily. 12/01/19  Yes Esterwood, Amy S, PA-C  FLUoxetine (PROZAC) 10 MG tablet Take 1 tablet (10 mg total) by mouth daily. 12/29/19  Yes Opalski, Neoma Laming, DO  ondansetron (ZOFRAN) 4 MG tablet Take 1 tablet (4 mg total) by mouth every 6 (six) hours as needed for nausea or vomiting. 11/29/19  Yes Esterwood, Amy S, PA-C  busPIRone (BUSPAR) 5 MG  tablet Take 1 tablet (5 mg total) by mouth 3 (three) times daily. 01/24/20   Rai, Vernelle Emerald, MD  folic acid (FOLVITE) 1 MG tablet Take 1 tablet (1 mg total) by mouth daily. 01/24/20   Rai, Vernelle Emerald, MD  iron polysaccharides (NIFEREX) 150 MG capsule Take 1 capsule (150 mg total) by mouth daily. 01/24/20   Rai, Vernelle Emerald, MD  metoprolol tartrate (LOPRESSOR) 25 MG tablet Take  1 tablet (25 mg total) by mouth 2 (two) times daily. 01/24/20   Rai, Ripudeep K, MD  mirtazapine (REMERON) 7.5 MG tablet Take 1 tablet (7.5 mg total) by mouth at bedtime. Patient not taking: Reported on 01/18/2020 01/18/20   Gatha Mayer, MD  ondansetron (ZOFRAN-ODT) 8 MG disintegrating tablet Take 1 tablet (8 mg total) by mouth every 8 (eight) hours as needed for nausea or refractory nausea / vomiting. Patient not taking: Reported on 01/18/2020 09/20/19   Mellody Dance, DO  traMADol (ULTRAM) 50 MG tablet Take 1 tablet (50 mg total) by mouth every 6 (six) hours as needed (pain not controlled with tylenol and ibuprofen). 01/21/20   Saverio Danker, PA-C  vitamin B-12 1000 MCG tablet Take 1 tablet (1,000 mcg total) by mouth daily. 01/24/20   Mendel Corning, MD    Kipp Brood, MD [facility] ICU Physician [place]  Pager: [phone] Mobile: [phone] After hours: [phone].  01/24/2020, 6:46 PM

## 2020-01-24 NOTE — Progress Notes (Signed)
Report called to nurse at [facility]. Care link here to get patient.

## 2020-01-24 NOTE — Progress Notes (Addendum)
DC order cancelled secondary to ambulation issues with PT.  Medicine has ordered a CT of the head which reveals hydrocephalus with mass effect on the 4th ventricle.  A STAT MRI of the brain has been ordered per radiology request.  Medicine is calling neurology for further plan of care.  Have discussed with medicine a possible transfer of services.  We will discuss further after discussion with neuro.  ADDENDUM: discussion by medicine with NS.  Patient going to neuro ICU to cone on CCM service for NS evaluation for possible brain tumor with secondary hydrocephalus.  The patient is surgically stable from a general surgery standpoint with appropriate follow up already made for the patient when she is stable for DC medically.  We will sign off.  Call if you have any questions.  [name] 3:15 PM 01/24/2020

## 2020-01-24 NOTE — Progress Notes (Addendum)
TRH Consult progress note                                                                               Patient Demographics  Cristina Conner, is a 59 y.o. female, DOB - 1960-12-20, [id number]  Admit date - 01/18/2020   Admitting Physician Coralie Keens, MD  Outpatient Primary MD for the patient is Mellody Dance, DO  Outpatient specialists:   LOS - 6  days   Medical records reviewed and are as summarized below:    Chief Complaint  Patient presents with   Abdominal Pain   Emesis   Constipation       Brief summary   Cristina Conner is a 59 yo AAF with PMH HTN (dx ~4 yrs ago), anxiety/depression (s/p passing of husband), prediabetes (A1c 5.7-5.8%), diverticulosis who presented to the hospital on 01/18/20 with N/V/abd pain. She has been undergoing a large outpatient GI workup lately for lingering abdominal pain/symptoms with a rather unremarkable workup it appears.  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  She underwent diagnostic laparoscopy with lap assisted small bowel resection for capsule removal on 01/19/2020. Of note, her prior work-up was concerning for small bowel inflammation/wall thickening on CT in March 2021.  After further note review, there was concern from GI that her lisinopril could possibly be causing angioedema of the intestine due to ACEi effect (11/17/19 phone visit).  After the passing of her husband she was started on Prozac which was uptitrated, however after further PCP note review, the up titration worsened her anxiety and she was lowered back down to Prozac 10 mg daily with the addition of BuSpar (PCP visit 07/19/19).  At the next follow-up visit on 08/09/2019 with PCP, the patient had reported taking herself off of both Prozac and BuSpar and noting an improved mood and that she had been doing lifestyle changes. Also, with further record review, when compliant on her BP regimen  (lisinopril/hctz 20-12.5), her blood pressure at office visits seems to range low 130s/mid 80s.  She has chronically had sinus tachycardia dating back to at least 2019 very persistently (110s).  There is no LVH on EKGs after review either.  No echo has been performed to date.  Patient still states hypertension diagnosis has only been for approximately 4 years.  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  Patient does not want Norvasc, states that she does not typically get headaches and Norvasc is only new medication started -TSH within normal limits 0.754, T4 0.9 - change to lopressor 25mg  BID, sent script to her [facility]  -Discontinued Norvasc.  Recommended follow-up with her PCP for further adjustment for her medications, she can restart hydrochlorothiazide outpatient at the follow-up appointment. No ACE inhibitor secondary to possibly causing angioedema of the intestine (11/17/2019 phone  visit) -She declined any imaging of the brain, will take Tylenol for the headache Addendum 1:40pm -Received page from the nurse that patient's daughter requesting CT head, she felt patient's gait was off during the PT evaluation -Ordered CT head. Discussed with surgery, Saverio Danker.  If CT head negative,  PT has recommended home health PT. patient to follow-up with PCP. Addendum: 3:40pm - CT head reviewed.  I spoke with neurology, Dr. 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  I spoke with Dr. Annette Stable, who agreed with above and recommended IV Decadron, MRI brain with and without  contrast and emergent transfer to [facility] neuro ICU.  Placed on Decadron 10 mg IV x1 then 4 mg IV every 6 hours -Spoke with CCM, Georgann Housekeeper, accepted transfer to the ICU.  Dr. Annette Stable will see the patient when she arrives.  -Discussed all the above with the primary service (surgery), patient's RN, patient and her [name] who agrees with the management. To expedite the transfer, I have notified [facility] service.   Generalized debility, folate deficiency, low B12  Anemia of chronic disease -Has anemia of chronic disease, baseline ~12, has been undergoing outpatient GI work-up -Anemia panel showed ferritin 42, folate 2.7, borderline B12 [id number] - placed on Folic acid 1 mg daily, B12 1088mcg daily, Iron supplement, sent scripts to her pharmacy     Sleep difficulties, depression   - Death of family member-  husband passed Jul 2020 - continue fluoxetine, added buspirone, patient had wished to resume both fluoxetine and buspirone, which she had stopped outpatient, sent script   -Follow-up outpatient with her PCP   Estimated body mass index is 19.85 kg/m as calculated from the following:   Height as of this encounter: 5\' 6"  (1.676 m).   Weight as of this encounter: 55.8 kg.  Code Status: Full CODE STATUS DVT Prophylaxis:  Lovenox    Disposition Plan:     Status is: Inpatient. Okay to DC from medical standpoint if she is surgically stable  time Spent in minutes  63mins    Antimicrobials:   Anti-infectives (From admission, onward)   Start     Dose/Rate Route Frequency Ordered Stop   01/19/20 1100  cefoTEtan (CEFOTAN) 2 g in sodium chloride 0.9 % 100 mL IVPB     2 g 200 mL/hr over 30 Minutes Intravenous On call to O.R. 01/19/20 1058 01/19/20 1506          Medications  Scheduled Meds:  acetaminophen  1,000 mg Oral Q6H   busPIRone  5 mg Oral TID   dicyclomine  10 mg Oral TID AC   enoxaparin (LOVENOX) injection  40 mg Subcutaneous Q24H   famotidine  40 mg Oral Daily    FLUoxetine  10 mg Oral Daily   folic acid  1 mg Oral Daily   iron polysaccharides  150 mg Oral Daily   metoprolol tartrate  25 mg Oral BID   multivitamin with minerals  1 tablet Oral Daily   polyethylene glycol  17 g Oral BID   sodium chloride flush  3 mL Intravenous Once   sodium chloride flush  3 mL Intravenous Q12H   vitamin B-12  1,000 mcg Oral Daily   Continuous Infusions:  sodium chloride 250 mL (01/23/20 1152)   methocarbamol (ROBAXIN) IV 1,000 mg (01/23/20 1949)   PRN Meds:.sodium chloride, alum & mag hydroxide-simeth, bisacodyl, diphenhydrAMINE **OR** diphenhydrAMINE, hydrALAZINE, HYDROmorphone (DILAUDID) injection, ibuprofen,  labetalol, magic mouthwash, methocarbamol (ROBAXIN) IV, ondansetron **OR** ondansetron (ZOFRAN) IV, polyethylene glycol, simethicone, sodium chloride flush, traMADol, zolpidem      Subjective:   Kristy-Lee Flohr was seen and examined today.  Complaining of headache, no focal neurological deficits, sinus tachycardia.   Patient denies dizziness, chest pain, shortness of breath, no focal weakness.  Objective:   Vitals:   01/24/20 0753 01/24/20 0754 01/24/20 0826 01/24/20 0949  BP: (!) 134/92 (!) 154/91 (!) 151/85 (!) 135/92  Pulse: (!) 111 (!) 139 99 (!) 103  Resp: 20 (!) 22 18 16   Temp: 98.9 F (37.2 C) 98.8 F (37.1 C) 98.3 F (36.8 C) 98.4 F (36.9 C)  TempSrc: Oral  Oral Oral  SpO2: 99% 99% 100% 100%  Weight:      Height:        Intake/Output Summary (Last 24 hours) at 01/24/2020 1006 Last data filed at 01/24/2020 0900 Gross per 24 hour  Intake 1668.32 ml  Output 2200 ml  Net -531.68 ml     Wt Readings from Last 3 Encounters:  01/18/20 55.8 kg  01/14/20 57.2 kg  01/12/20 57.2 kg     Exam  General: Alert and oriented x 3, NAD  Cardiovascular: S1 S2 auscultated, no murmurs, RRR  Respiratory: Clear to auscultation bilaterally, no wheezing, rales or rhonchi  Gastrointestinal: Soft, appropriately tender from surgery.   Nondistended, + bowel sounds  Ext: no pedal edema bilaterally  Neuro: No new deficit  Musculoskeletal: No digital cyanosis, clubbing  Skin: No rashes  Psych: Normal affect and demeanor, alert and oriented x3    Data Reviewed:  I have personally reviewed following labs and imaging studies  Micro Results Recent Results (from the past 240 hour(s))  SARS Coronavirus 2 by RT PCR (hospital order, performed in [facility] hospital lab) Nasopharyngeal Nasopharyngeal Swab     Status: None   Collection Time: 01/18/20  6:59 PM   Specimen: Nasopharyngeal Swab  Result Value Ref Range Status   SARS Coronavirus 2 NEGATIVE NEGATIVE Final    Comment: (NOTE) SARS-CoV-2 target nucleic acids are NOT DETECTED. The SARS-CoV-2 RNA is generally detectable in upper and lower respiratory specimens during the acute phase of infection. The lowest concentration of SARS-CoV-2 viral copies this assay can detect is 250 copies / mL. A negative result does not preclude SARS-CoV-2 infection and should not be used as the sole basis for treatment or other patient management decisions.  A negative result may occur with improper specimen collection / handling, submission of specimen other than nasopharyngeal swab, presence of viral mutation(s) within the areas targeted by this assay, and inadequate number of viral copies (<250 copies / mL). A negative result must be combined with clinical observations, patient history, and epidemiological information. Fact Sheet for Patients:   StrictlyIdeas.no Fact Sheet for Healthcare Providers: BankingDealers.co.za This test is not yet approved or cleared  by the [place] FDA and has been authorized for detection and/or diagnosis of SARS-CoV-2 by FDA under an Emergency Use Authorization (EUA).  This EUA will remain in effect (meaning this test can be used) for the duration of the COVID-19 declaration under Section 564(b)(1)  of the Act, 21 U.S.C. section 360bbb-3(b)(1), unless the authorization is terminated or revoked sooner. Performed at [facility], [place] [address], [place], [place] [address]   MRSA PCR Screening     Status: None   Collection Time: 01/19/20 12:48 PM   Specimen: Nasal Mucosa; Nasopharyngeal  Result Value Ref Range Status  MRSA by PCR NEGATIVE NEGATIVE Final    Comment:        The GeneXpert MRSA Assay (FDA approved for NASAL specimens only), is one component of a comprehensive MRSA colonization surveillance program. It is not intended to diagnose MRSA infection nor to guide or monitor treatment for MRSA infections. Performed at [facility], [place] [address] [place],  [address]     Radiology Reports CT ABDOMEN PELVIS WO CONTRAST  Result Date: 01/14/2020 CLINICAL DATA:  Retained endoscopy capsule. EXAM: CT ABDOMEN AND PELVIS WITHOUT CONTRAST TECHNIQUE: Multidetector CT imaging of the abdomen and pelvis was performed following the standard protocol without IV contrast. COMPARISON:  Plain film abdomen 01/07/2020, CT of the abdomen and pelvis from 11/17/2019 FINDINGS: Lower chest: Basilar atelectasis without evidence of pleural effusion. No sign of consolidation. No pericardial fluid. Heart is incompletely imaged. Hepatobiliary: No focal, suspicious hepatic lesion. Area of low-density in the posterior RIGHT hepatic lobe compatible with hemangioma. Better seen on previous imaging studies. No pericholecystic stranding. No gross biliary ductal dilation. Pancreas: Pancreas is normal. Spleen: Spleen normal size without focal lesion. Adrenals/Urinary Tract: Adrenal glands are normal. Urinary bladder is unremarkable. No nephrolithiasis. Stomach/Bowel: Signs of enteritis that were seen on the previous CT study are no longer as evident. There may be very mild stranding in the small bowel mesentery. Within the pelvic small bowel loops, likely in the mid ileum  ileum there is a radiodense or partially radiodense structure measuring 2.5 x 1.0 cm compatible with given history retained capsule. Based on comparison with the prior plain film evaluation from May 7th this appears to be in a similar position. This causes abundant streak artifact limiting assessment in this area but there is no sign of bowel obstruction or significant adjacent stranding. The appendix is normal. No acute colonic process. Vascular/Lymphatic: Scattered calcified atherosclerotic changes in the abdominal aorta and iliac vessels. No adenopathy in the retroperitoneum or in the upper abdomen. No pelvic lymphadenopathy. Reproductive: Grossly unremarkable. Pelvic structures not well assessed due to streak artifact from LEFT hip arthroplasty. Other: No abdominal wall hernia or abnormality. No abdominopelvic ascites. Musculoskeletal: No acute musculoskeletal process. Spinal degenerative changes. LEFT hip arthroplasty. IMPRESSION: 1. Resolution of changes of enteritis seen on the previous study. Question very subtle stranding in the jejunal mesentery. 2. Signs of retained capsule for endoscopy as given in history, noted in the mid to distal ileum. Based on comparison with plain film evaluation from 01/07/2020 likely in very similar position. No evidence of obstruction or significant perienteric stranding accounting for limitations due to streak artifact. 3. Atherosclerotic changes in the abdominal aorta and iliac vessels. 4. Aortic atherosclerosis. Aortic Atherosclerosis (ICD10-I70.0). Electronically Signed   By: Zetta Bills M.D.   On: 01/14/2020 16:13   DG Abdomen 1 View  Result Date: 01/18/2020 CLINICAL DATA:  The patient began pill endoscopy 2 weeks ago. EXAM: ABDOMEN - 1 VIEW COMPARISON:  KUB 01/07/2020.  CT abdomen and pelvis 01/14/2020. FINDINGS: Endoscopy capsule projecting in the pelvis just to the right of midline is again seen. The position is not notably changed. Bowel gas pattern is  nonobstructive. No unexpected abdominal calcification or focal bony abnormality. IMPRESSION: Endoscopy capsule is essentially unchanged in position projecting in the pelvis just to the right of midline. Electronically Signed   By: Inge Rise M.D.   On: 01/18/2020 16:18   DG Abd 1 View  Result Date: 01/07/2020 CLINICAL DATA:  Patient undergoing capsule endoscopy. EXAM: ABDOMEN - 1 VIEW COMPARISON:  Single-view of the abdomen 01/03/2020. FINDINGS: Endoscopy capsule is unchanged in position projecting in the central pelvis. Bowel gas pattern is nonobstructive. IMPRESSION: Endoscopy capsule remains in the central pelvis, likely in the sigmoid colon. No acute finding. Electronically Signed   By: Inge Rise M.D.   On: 01/07/2020 16:34   DG Abd 1 View  Result Date: 01/03/2020 CLINICAL DATA:  Follow-up capsule endoscopy EXAM: ABDOMEN - 1 VIEW COMPARISON:  None. FINDINGS: The bowel gas pattern is normal. Moderate burden of stool throughout the colon and rectum. Capsule endoscope projects in the central pelvis, likely within the sigmoid colon or rectum. No radio-opaque calculi or other significant radiographic abnormality are seen. IMPRESSION: Nonobstructive pattern of bowel gas. Moderate burden of stool throughout the colon and rectum. Capsule endoscope projects in the central pelvis, likely within the sigmoid colon or rectum. Electronically Signed   By: Eddie Candle M.D.   On: 01/03/2020 16:17    Lab Data:  CBC: Recent Labs  Lab 01/18/20 1538 01/23/20 0423 01/24/20 0346  WBC 10.3 5.0 4.1  HGB 13.9 8.6* 8.8*  HCT 45.1 27.8* 29.2*  MCV 80.7 81.3 83.0  PLT 296 228 [id number]   Basic Metabolic Panel: Recent Labs  Lab 01/18/20 1538 01/23/20 0423 01/24/20 0346  NA 140  --  141  K 3.4* 3.8 3.6  CL 104  --  107  CO2 25  --  26  GLUCOSE 139*  --  98  BUN 15  --  10  CREATININE 0.93 0.79 0.81  CALCIUM 9.9  --  8.5*  MG  --  1.9 2.2   GFR: Estimated Creatinine Clearance: 67.5 mL/min (by C-G  formula based on SCr of 0.81 mg/dL). Liver Function Tests: Recent Labs  Lab 01/18/20 1538  AST 14*  ALT 14  ALKPHOS 64  BILITOT 0.9  PROT 7.4  ALBUMIN 4.4   Recent Labs  Lab 01/18/20 1538 01/23/20 1030 01/24/20 0346  LIPASE 33 26 26   No results for input(s): AMMONIA in the last 168 hours. Coagulation Profile: No results for input(s): INR, PROTIME in the last 168 hours. Cardiac Enzymes: No results for input(s): CKTOTAL, CKMB, CKMBINDEX, TROPONINI in the last 168 hours. BNP (last 3 results) No results for input(s): PROBNP in the last 8760 hours. HbA1C: Recent Labs    01/23/20 0939  HGBA1C 5.4   CBG: No results for input(s): GLUCAP in the last 168 hours. Lipid Profile: Recent Labs    01/23/20 0939  CHOL 141  HDL 34*  LDLCALC 74  TRIG 164*  CHOLHDL 4.1   Thyroid Function Tests: Recent Labs    01/23/20 0939  FREET4 0.99   Anemia Panel: No results for input(s): VITAMINB12, FOLATE, FERRITIN, TIBC, IRON, RETICCTPCT in the last 72 hours. Urine analysis:    Component Value Date/Time   COLORURINE YELLOW 01/22/2020 0956   APPEARANCEUR CLOUDY (A) 01/22/2020 0956   LABSPEC 1.012 01/22/2020 0956   PHURINE 8.0 01/22/2020 0956   GLUCOSEU NEGATIVE 01/22/2020 0956   HGBUR NEGATIVE 01/22/2020 0956   BILIRUBINUR NEGATIVE 01/22/2020 0956   KETONESUR NEGATIVE 01/22/2020 0956   PROTEINUR NEGATIVE 01/22/2020 0956   UROBILINOGEN 1.0 08/08/2012 0218   NITRITE NEGATIVE 01/22/2020 0956   LEUKOCYTESUR TRACE (A) 01/22/2020 0956     Esteven Overfelt M.D. [facility] 01/24/2020, 10:06 AM   Call night coverage person covering after 7pm

## 2020-01-24 NOTE — Consult Note (Signed)
Reason for Consult: Hydrocephalus Referring Physician: Medicine  Cristina Conner is an 59 y.o. female.  HPI: 59 year old female admitted to [facility] long partial small bowel obstruction secondary to endoscopy capsule. the patient's obstruction has resolved with medical management.  Patient was noted over the last 24 hours to have difficulty with hypotension and tachycardia.  She also complains of headache.  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  The patient notes that she has been having difficulty with some progressive headaches over the last 29months.  She is also noted decreased hearing on the left side.  She has noted unsteadiness and some difficulty walking for the past month.  She has no history of known malignancy.  She has no fevers, chills or other signs of infection.  Past Medical History:  Diagnosis Date  . Anxiety   . Arthritis   . Diverticulosis   . GERD (gastroesophageal reflux disease)   . Hypertension   . Osteopenia   . Post-operative nausea and vomiting    vomiting after general anesthesia per pt.  . Pre-diabetes     Past Surgical History:  Procedure Laterality Date  . CESAREAN SECTION     x 2  . COLONOSCOPY    . ENDOMETRIAL ABLATION    . ESOPHAGOGASTRODUODENOSCOPY  10/2019  . LAPAROSCOPY N/A 01/19/2020   Procedure: LAPAROSCOPY DIAGNOSTIC LAPAROTOMY WITH SMALL BOWEL RESECTION;  Surgeon: Ileana Roup, MD;  Location: [facility];  Service: General;  Laterality: N/A;  . PELVIC LAPAROSCOPY  1999   left salpingectomy, excision of Right, paratubal cyst  . PELVIC LAPAROSCOPY     laparoscopy with lysis of pelvic adhesions  . SHOULDER SURGERY  11/2010   "FROZEN SHOULDER"  . TOTAL HIP ARTHROPLASTY Left 08/17/2019   Procedure: LEFT TOTAL HIP ARTHROPLASTY ANTERIOR APPROACH;  Surgeon: Melrose Nakayama, MD;  Location: [facility];  Service: Orthopedics;  Laterality: Left;  . TUBAL LIGATION    . UPPER GASTROINTESTINAL ENDOSCOPY       Family History  Problem Relation Age of Onset  . Hypertension Mother   . Heart disease Mother   . Diabetes Mother   . Hypertension Father   . Colon cancer Neg Hx   . Esophageal cancer Neg Hx   . Stomach cancer Neg Hx   . Rectal cancer Neg Hx   . Colon polyps Neg Hx     Social History:  reports that she has never smoked. She has never used smokeless tobacco. She reports previous alcohol use. She reports that she does not use drugs.  Allergies:  Allergies  Allergen Reactions  . Hydrocodone Nausea And Vomiting    Medications: I have reviewed the patient's current medications.  Results for orders placed or performed during the hospital encounter of 01/18/20 (from the past 48 hour(s))  CBC     Status: Abnormal   Collection Time: 01/23/20  4:23 AM  Result Value Ref Range   WBC 5.0 4.0 - 10.5 K/uL   RBC 3.42 (L) 3.87 - 5.11 MIL/uL   Hemoglobin 8.6 (L) 12.0 - 15.0 g/dL   HCT 27.8 (L) 36.0 - 46.0 %   MCV 81.3 80.0 - 100.0 fL   MCH 25.1 (L) 26.0 - 34.0 pg   MCHC 30.9 30.0 - 36.0 g/dL   RDW 19.0 (H) 11.5 - 15.5 %   Platelets 228 150 - 400 K/uL   nRBC 0.0 0.0 - 0.2 %    Comment: Performed at [facility], [place]  [address] [place], [place] [address]  Magnesium     Status: None   Collection Time: 01/23/20  4:23 AM  Result Value Ref Range   Magnesium 1.9 1.7 - 2.4 mg/dL    Comment: Performed at [facility], [place] [address] [place], [place] [address]  Potassium     Status: None   Collection Time: 01/23/20  4:23 AM  Result Value Ref Range   Potassium 3.8 3.5 - 5.1 mmol/L    Comment: Performed at [facility], [place] [address], [place], [place] [address]  Creatinine, serum     Status: None   Collection Time: 01/23/20  4:23 AM  Result Value Ref Range   Creatinine, Ser 0.79 0.44 - 1.00 mg/dL   GFR calc non Af Amer >60 >60 mL/min   GFR calc Af Amer >60 >60 mL/min    Comment: Performed at [facility], [place] [address], [place], [place] [address]  T4, free     Status: None   Collection Time: 01/23/20  9:39 AM  Result Value Ref Range   Free T4 0.99 0.61 - 1.12 ng/dL    Comment: (NOTE) Biotin ingestion may interfere with free T4 tests. If the results are inconsistent with the TSH level, previous test results, or the clinical presentation, then consider biotin interference. If needed, order repeat testing after stopping biotin. Performed at [facility], [place] [address], [place], [place] [address]   T3     Status: None   Collection Time: 01/23/20  9:39 AM  Result Value Ref Range   T3, Total 112 71 - 180 ng/dL    Comment: (NOTE) Performed At: [facility] [place], [place] [id number] Rush Farmer MD [id number]   Hemoglobin A1c     Status: None   Collection Time: 01/23/20  9:39 AM  Result Value Ref Range   Hgb A1c MFr Bld 5.4 4.8 - 5.6 %    Comment: (NOTE) Pre diabetes:          5.7%-6.4% Diabetes:              >6.4% Glycemic control for   <7.0% adults with diabetes    Mean Plasma Glucose 108.28 mg/dL    Comment: Performed at [facility], [place] [address], [place],  [address]  Lipid panel     Status: Abnormal   Collection Time: 01/23/20  9:39 AM  Result Value Ref Range   Cholesterol 141 0 - 200 mg/dL   Triglycerides 164 (H) <150 mg/dL   HDL 34 (L) >40 mg/dL   Total CHOL/HDL Ratio 4.1 RATIO   VLDL 33 0 - 40 mg/dL   LDL Cholesterol 74 0 - 99 mg/dL    Comment:        Total Cholesterol/HDL:CHD Risk Coronary Heart Disease Risk Table                     Men   Women  1/2 Average Risk   3.4   3.3  Average Risk       5.0   4.4  2 X Average Risk   9.6   7.1  3 X Average Risk  23.4   11.0        Use the calculated Patient Ratio above and the CHD Risk Table to determine the patient's CHD Risk.        ATP III CLASSIFICATION (LDL):  <100     mg/dL   Optimal  100-129  mg/dL   Near or Above                    Optimal  130-159  mg/dL    Borderline  160-189  mg/dL   High  >190     mg/dL   Very High Performed at [place] [address], [place], [place] [address]   Prealbumin     Status: None   Collection Time: 01/23/20 10:30 AM  Result Value Ref Range   Prealbumin 20.0 18 - 38 mg/dL    Comment: Performed at [facility], [place] [address] [place], [place] [address]  Lipase, blood     Status: None   Collection Time: 01/23/20 10:30 AM  Result Value Ref Range   Lipase 26 11 - 51 U/L    Comment: Performed at [facility], [place] [address], [place], [place] [address]  CBC     Status: Abnormal   Collection Time: 01/24/20  3:46 AM  Result Value Ref Range   WBC 4.1 4.0 - 10.5 K/uL   RBC 3.52 (L) 3.87 - 5.11 MIL/uL   Hemoglobin 8.8 (L) 12.0 - 15.0 g/dL   HCT 29.2 (L) 36.0 - 46.0 %   MCV 83.0 80.0 - 100.0 fL   MCH 25.0 (L) 26.0 - 34.0 pg   MCHC 30.1 30.0 - 36.0 g/dL   RDW 18.8 (H) 11.5 - 15.5 %   Platelets 235 150 - 400 K/uL   nRBC 0.0 0.0 - 0.2 %    Comment: Performed at [facility], [place] [address], [place], [place] [id number]  Basic metabolic panel     Status: Abnormal   Collection Time: 01/24/20  3:46 AM  Result Value Ref Range   Sodium 141 135 - 145 mmol/L   Potassium 3.6 3.5 - 5.1 mmol/L   Chloride 107 98 - 111 mmol/L   CO2 26 22 - 32 mmol/L   Glucose, Bld 98 70 - 99 mg/dL    Comment: Glucose reference range applies only to samples taken after fasting for at least 8 hours.   BUN 10 6 - 20 mg/dL   Creatinine, Ser 0.81 0.44 - 1.00 mg/dL   Calcium 8.5 (L) 8.9 - 10.3 mg/dL   GFR calc non Af Amer >60 >60 mL/min   GFR calc Af Amer >60 >60 mL/min   Anion gap 8 5 - 15    Comment: Performed at [facility], [place] [address], [place], [place] [address]  Magnesium     Status: None   Collection Time: 01/24/20  3:46 AM  Result Value Ref Range   Magnesium 2.2 1.7 - 2.4 mg/dL    Comment: Performed at [facility], [place] [address] [place], [place] [address]  Lipase, blood     Status: None   Collection Time: 01/24/20  3:46 AM  Result Value Ref Range   Lipase 26 11 - 51 U/L    Comment: Performed at [facility], [place] [address], [place], [place] [address]    CT HEAD WO CONTRAST  Result Date: 01/24/2020 CLINICAL DATA:  Postop day 5 small-bowel resection.  Confusion. EXAM: CT HEAD WITHOUT CONTRAST TECHNIQUE: Contiguous axial images were obtained from the base of the skull through the vertex without intravenous contrast. COMPARISON:  None. FINDINGS: Brain: Moderate ventricular enlargement with rounded ventricles consistent with hydrocephalus. Periventricular white matter hypodensity likely due to transependymal resorption of CSF due to  hydrocephalus. Aqueduct is distended. The fourth ventricle is dilated however there is mass-effect on the left side of the fourth ventricle. Mass-effect on the fourth ventricle on the left side. Fourth ventricle is displaced to the right. Possible mass lesion in the left posterior fossa displacing the brachium pontis. Vascular: Negative for hyperdense vessel Skull: Negative Sinuses/Orbits: Paranasal sinuses clear.  Negative orbit Other: None IMPRESSION: Moderate hydrocephalus. No prior studies for comparison. Transependymal resorption of CSF in the white matter due to hydrocephalus There is mass-effect on the posterior fossa on the left. Fourth ventricle is displaced to the right and distorted. Recommend MRI brain without with contrast for further evaluation. Electronically Signed   By: Franchot Gallo M.D.   On: 01/24/2020 15:02    Pertinent items noted in HPI and remainder of comprehensive ROS otherwise negative. Blood pressure (!) 173/90, pulse 76, temperature 97.9 F (36.6 C), temperature source Oral, resp. rate 13, height 5\' 6"  (1.676 m), weight 55.8 kg, last menstrual period 05/01/2009, SpO2 100 %. The patient is awake and alert.  She does not appear  to be any distress.  Her speech is fluent.  Her judgment and insight are intact.  Cranial nerve function finds her to have a normal visual field and normal visual acuity bilaterally.  Extraocular movements are full.  Her facial movement and sensation normal bilaterally.  Her tongue protrudes midline.  She has diminished hearing on the left side.  Motor examination of her extremities reveals 5/5 strength in both upper and lower extremities.  He has very mild dysmetria with finger-to-nose testing.  Gait is not assessed at this time.  Examination head ears eyes nose throat:.  Chest and abdomen are benign appearing.  Extremities are free from any deformity.  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  Plan MRI scan tonight emergently.  Continue IV steroids and ICU observation.  No indication for external ventricular drainage at present.   Cristina Conner 01/24/2020, 6:37 PM

## 2020-01-24 NOTE — Progress Notes (Signed)
Physical Therapy Treatment Patient Details Name: Cristina Conner MRN: [id number] DOB: October 27, 1960 Today's Date: 01/24/2020    History of Present Illness s/p lap assisted SBR for retained capsule endoscope, Dr. Dema Severin, 5/19. PMH: HTN, L DA THA 2020    PT Comments    Pt continues to exhibit "parkinson like" gait. Shuffling/decr step length bil, wide BOS, intermittent festinating  and with Anterior LOB x3 requiring min to mod assist to recover.  Pt dtr states that hospitalist offered CT scan today and her mother declined, dtr (who is an SLP) feels pt would benefit from CT scan. PT in agreement. Pt may need to follow up with neuro if current presentation persists.    Follow Up Recommendations  Home health PT;Supervision/Assistance - 24 hour     Equipment Recommendations  None recommended by PT    Recommendations for Other Services       Precautions / Restrictions Precautions Precautions: Fall Precaution Comments: pt is a significant fall risk Restrictions Weight Bearing Restrictions: No    Mobility  Bed Mobility Overal bed mobility: Needs Assistance Bed Mobility: Rolling;Sidelying to Sit;Sit to Sidelying   Sidelying to sit: HOB elevated     Sit to sidelying: Min guard General bed mobility comments: for safety, incr time   Transfers Overall transfer level: Needs assistance Equipment used: Rolling walker (2 wheeled) Transfers: Sit to/from Stand Sit to Stand: Min guard         General transfer comment: cues for hand placement, anterior-superior and to power up with LEs, incr time   Ambulation/Gait Ambulation/Gait assistance: Min assist Gait Distance (Feet): 140 Feet Assistive device: Rolling walker (2 wheeled);None;1 person hand held assist Gait Pattern/deviations: Trunk flexed;Wide base of support;Step-to pattern;Decreased step length - right;Decreased step length - left;Shuffle;Festinating     General Gait Details: pt requiring assist for balance and to maneuver RW,  continues to have LOB with and without use of RW, LOB x3 with min to mod assist to recover cues for posture and to incr step length bil   Stairs             Wheelchair Mobility    Modified Rankin (Stroke Patients Only)       Balance Overall balance assessment: Needs assistance Sitting-balance support: No upper extremity supported;Feet supported Sitting balance-Leahy Scale: Good     Standing balance support: Single extremity supported;During functional activity;Bilateral upper extremity supported Standing balance-Leahy Scale: Fair Standing balance comment: requiring assist for dynamic tasks (LOB x3 during dynamic gait)             High level balance activites: Backward walking;Direction changes;Turns;Sudden stops High Level Balance Comments: anterior LOB x 3 during gait and dynamic tasks            Cognition Arousal/Alertness: Awake/alert Behavior During Therapy: WFL for tasks assessed/performed Overall Cognitive Status: Within Functional Limits for tasks assessed                                        Exercises      General Comments        Pertinent Vitals/Pain Pain Assessment: 0-10 Pain Score: 2  Pain Location: abd  Pain Descriptors / Indicators: Discomfort;Sore Pain Intervention(s): Limited activity within patient's tolerance;Monitored during session;Premedicated before session;Repositioned    Home Living                      Prior Function  PT Goals (current goals can now be found in the care plan section) Acute Rehab PT Goals Patient Stated Goal: feel better, less pain PT Goal Formulation: With patient Time For Goal Achievement: 01/28/20 Potential to Achieve Goals: Good Progress towards PT goals: Progressing toward goals    Frequency    Min 3X/week      PT Plan Current plan remains appropriate    Co-evaluation              AM-PAC PT "6 Clicks" Mobility   Outcome Measure  Help needed  turning from your back to your side while in a flat bed without using bedrails?: A Little Help needed moving from lying on your back to sitting on the side of a flat bed without using bedrails?: A Little Help needed moving to and from a bed to a chair (including a wheelchair)?: A Little Help needed standing up from a chair using your arms (e.g., wheelchair or bedside chair)?: A Little Help needed to walk in hospital room?: A Little Help needed climbing 3-5 steps with a railing? : A Lot 6 Click Score: 17    End of Session Equipment Utilized During Treatment: Gait belt Activity Tolerance: Patient tolerated treatment well Patient left: in bed;with call bell/phone within reach;with bed alarm set;with family/visitor present Nurse Communication: Mobility status PT Visit Diagnosis: Difficulty in walking, not elsewhere classified (R26.2);Other abnormalities of gait and mobility (R26.89);Unsteadiness on feet (R26.81)     Time: [id number] PT Time Calculation (min) (ACUTE ONLY): 18 min  Charges:  $Gait Training: 8-22 mins                     Baxter Flattery, PT   Acute Rehab Dept [facility]): [id number]   01/24/2020    [facility] 01/24/2020, 1:39 PM

## 2020-01-25 LAB — GLUCOSE, CAPILLARY
Glucose-Capillary: 140 mg/dL — ABNORMAL HIGH (ref 70–99)
Glucose-Capillary: 156 mg/dL — ABNORMAL HIGH (ref 70–99)
Glucose-Capillary: 158 mg/dL — ABNORMAL HIGH (ref 70–99)
Glucose-Capillary: 166 mg/dL — ABNORMAL HIGH (ref 70–99)
Glucose-Capillary: 173 mg/dL — ABNORMAL HIGH (ref 70–99)

## 2020-01-25 MED ORDER — POLYETHYLENE GLYCOL 3350 17 G PO PACK
17.0000 g | PACK | Freq: Every day | ORAL | Status: DC
  Administered 2020-01-26 – 2020-01-27 (×2): 17 g via ORAL
  Filled 2020-01-25 (×3): qty 1

## 2020-01-25 NOTE — Progress Notes (Signed)
Responded to unit page to assist patient with AD.  Patient wanted time to review document with daughter at bedside.  I gave AD to patient and patient will have chaplain paged when ready.   Jaclynn Major, [place], [facility], Pager [phone]

## 2020-01-25 NOTE — Progress Notes (Signed)
Neurosurgery Service Progress Note  Subjective: No acute events overnight, taking over care for Dr. Annette Stable given findings of a new skull base mass.    Objective: Vitals:   01/25/20 0700 01/25/20 0800 01/25/20 0908 01/25/20 0909  BP: (!) 141/97 (!) 147/89 (!) 135/97   Pulse: 78 95  89  Resp: 14 13  15   Temp:      TempSrc:      SpO2: 99% 100%  100%  Weight:      Height:       Temp (24hrs), Avg:98.1 F (36.7 C), Min:97.7 F (36.5 C), Max:98.5 F (36.9 C)  CBC Latest Ref Rng & Units 01/24/2020 01/24/2020 01/23/2020  WBC 4.0 - 10.5 K/uL 6.0 4.1 5.0  Hemoglobin 12.0 - 15.0 g/dL 10.3(L) 8.8(L) 8.6(L)  Hematocrit 36.0 - 46.0 % 33.8(L) 29.2(L) 27.8(L)  Platelets 150 - 400 K/uL 334 235 228   BMP Latest Ref Rng & Units 01/24/2020 01/24/2020 01/23/2020  Glucose 70 - 99 mg/dL - 98 -  BUN 6 - 20 mg/dL - 10 -  Creatinine 0.44 - 1.00 mg/dL 0.92 0.81 0.79  BUN/Creat Ratio 9 - 23 - - -  Sodium 135 - 145 mmol/L - 141 -  Potassium 3.5 - 5.1 mmol/L - 3.6 3.8  Chloride 98 - 111 mmol/L - 107 -  CO2 22 - 32 mmol/L - 26 -  Calcium 8.9 - 10.3 mg/dL - 8.5(L) -    Intake/Output Summary (Last 24 hours) at 01/25/2020 0926 Last data filed at 01/25/2020 0700 Gross per 24 hour  Intake 820.58 ml  Output --  Net 820.58 ml    Current Facility-Administered Medications:  .  0.9 %  sodium chloride infusion, 250 mL, Intravenous, PRN, Michael Boston, MD, Last Rate: 10 mL/hr at 01/24/20 1124, Rate Verify at 01/24/20 1124 .  acetaminophen (TYLENOL) tablet 1,000 mg, 1,000 mg, Oral, Q6H, Ileana Roup, MD, 1,000 mg at 01/25/20 0405 .  alum & mag hydroxide-simeth (MAALOX/MYLANTA) 200-200-20 MG/5ML suspension 30 mL, 30 mL, Oral, Q6H PRN, Michael Boston, MD, 30 mL at 01/22/20 1947 .  bisacodyl (DULCOLAX) suppository 10 mg, 10 mg, Rectal, Q12H PRN, Michael Boston, MD, 10 mg at 01/22/20 1147 .  busPIRone (BUSPAR) tablet 5 mg, 5 mg, Oral, TID, Dwyane Dee, MD, 5 mg at 01/24/20 2148 .  Chlorhexidine Gluconate Cloth 2  % PADS 6 each, 6 each, Topical, Daily, Kipp Brood, MD, 6 each at 01/24/20 1944 .  dexamethasone (DECADRON) injection 4 mg, 4 mg, Intravenous, Q6H, Rai, Ripudeep K, MD, 4 mg at 01/25/20 0406 .  dicyclomine (BENTYL) capsule 10 mg, 10 mg, Oral, TID Rogelia Mire, MD, 10 mg at 01/25/20 0801 .  diphenhydrAMINE (BENADRYL) capsule 25 mg, 25 mg, Oral, Q6H PRN **OR** diphenhydrAMINE (BENADRYL) injection 25 mg, 25 mg, Intravenous, Q6H PRN, Ileana Roup, MD .  famotidine (PEPCID) tablet 40 mg, 40 mg, Oral, Daily, Michael Boston, MD, 40 mg at 01/24/20 1104 .  FLUoxetine (PROZAC) capsule 10 mg, 10 mg, Oral, Daily, Michael Boston, MD, 10 mg at 01/24/20 1104 .  folic acid (FOLVITE) tablet 1 mg, 1 mg, Oral, Daily, Gross, Remo Lipps, MD, 1 mg at 01/24/20 1105 .  heparin injection 5,000 Units, 5,000 Units, Subcutaneous, Q8H, Agarwala, Ravi, MD, 5,000 Units at 01/25/20 0602 .  hydrALAZINE (APRESOLINE) tablet 25 mg, 25 mg, Oral, Q4H PRN, Dwyane Dee, MD, 25 mg at 01/24/20 2003 .  HYDROmorphone (DILAUDID) injection 0.5 mg, 0.5 mg, Intravenous, Q3H PRN, Ileana Roup, MD .  ibuprofen (ADVIL)  tablet 600 mg, 600 mg, Oral, Q6H PRN, Ileana Roup, MD, 600 mg at 01/25/20 [id number] .  insulin aspart (novoLOG) injection 1-3 Units, 1-3 Units, Subcutaneous, Q4H, Kipp Brood, MD, 2 Units at 01/25/20 [phone] .  iron polysaccharides (NIFEREX) capsule 150 mg, 150 mg, Oral, Daily, Michael Boston, MD, 150 mg at 01/24/20 1105 .  labetalol (NORMODYNE) injection 10 mg, 10 mg, Intravenous, Q4H PRN, Dwyane Dee, MD, 10 mg at 01/24/20 1823 .  magic mouthwash, 15 mL, Oral, QID PRN, Michael Boston, MD .  methocarbamol (ROBAXIN) 1,000 mg in dextrose 5 % 100 mL IVPB, 1,000 mg, Intravenous, Q6H PRN, Michael Boston, MD, Last Rate: 200 mL/hr at 01/23/20 1949, 1,000 mg at 01/23/20 1949 .  metoprolol tartrate (LOPRESSOR) tablet 25 mg, 25 mg, Oral, BID, Rai, Ripudeep K, MD, 25 mg at 01/24/20 2148 .  multivitamin with minerals  tablet 1 tablet, 1 tablet, Oral, Daily, Dwyane Dee, MD, 1 tablet at 01/24/20 1105 .  ondansetron (ZOFRAN-ODT) disintegrating tablet 4 mg, 4 mg, Oral, Q6H PRN **OR** ondansetron (ZOFRAN) injection 4 mg, 4 mg, Intravenous, Q6H PRN, Ileana Roup, MD, 4 mg at 01/23/20 0936 .  polyethylene glycol (MIRALAX / GLYCOLAX) packet 17 g, 17 g, Oral, BID, Michael Boston, MD, 17 g at 01/24/20 2149 .  polyethylene glycol (MIRALAX / GLYCOLAX) packet 17 g, 17 g, Oral, Q12H PRN, Michael Boston, MD .  simethicone (MYLICON) 40 [id number] suspension 40 mg, 40 mg, Oral, QID PRN, Michael Boston, MD .  sodium chloride flush (NS) 0.9 % injection 3 mL, 3 mL, Intravenous, Once, White, Dejai Mt, MD .  sodium chloride flush (NS) 0.9 % injection 3 mL, 3 mL, Intravenous, Q12H, Michael Boston, MD, 3 mL at 01/25/20 0100 .  sodium chloride flush (NS) 0.9 % injection 3 mL, 3 mL, Intravenous, PRN, Michael Boston, MD .  traMADol Veatrice Bourbon) tablet 50 mg, 50 mg, Oral, Q6H PRN, Ileana Roup, MD, 50 mg at 01/24/20 0818 .  vitamin B-12 (CYANOCOBALAMIN) tablet 1,000 mcg, 1,000 mcg, Oral, Daily, Dwyane Dee, MD, 1,000 mcg at 01/24/20 1110 .  zolpidem (AMBIEN) tablet 5 mg, 5 mg, Oral, QHS PRN,MR X 1, Gross, Remo Lipps, MD   Physical Exam: AOx3, PERRL, EOMI, +dec'd hearing on L, FS, Strength 5/5 x4, SILTx4  Assessment & Plan: 59 y.o. woman s/p small bowel resection w/ post-op ataxia. 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  -OR 5/27 for resection and EVD placement -okay for transfer to stepdown from my standpoint but should be on constant tele -okay for [place]  01/25/20 9:26 AM

## 2020-01-25 NOTE — Plan of Care (Signed)

## 2020-01-25 NOTE — Progress Notes (Signed)
NAME:  Cristina Conner, MRN:  [id number], DOB:  1960-11-23, LOS: 7 ADMISSION DATE:  01/18/2020, CONSULTATION DATE:  5/24 REFERRING MD:  Dr Tana Coast, CHIEF COMPLAINT:  Obstructive hydrocephalus   Brief History   59 year old female admitted to [facility] for partial SBO following retained capsule endoscopy. She developed unilateral weakness and was discovered to have hydrocephalus with mass effect. Felt to strongly correlate with brain mass. Transferred to [facility] for ICU admission and neurosurgery eval.   History of present illness   59 year old female with PMH as below, who was admitted to [facility] on 5/18. She had a several month history of abdominal pain, nausea, and vomiting, for which she had been followed by [facility]. Etiology remained unclear despite symptomatic treatment and endoscopy on 4/12 with no acute findings. They proceeded with capsule endoscopy on 4/27, but did not pass. It was not found on colonoscopy 5/14. CT that day demonstrated capsule in the distal ileum. She presented to [facility] ED with complaints of nausea/vomiting and abdominal pain. She was admitted to the surgical service and underwent laparoscopic- assisted small bowel resection for capsule retrieval. The post-operative course was initially uncomplicated with the exclusion of some weakness with PT, which ultimately lead to her not being discharged. Course then became complicated by hypertension with associated blurry vision. While working with PT there were some unilateral weakness/ ambulation issues noted and she was taken for CT head. This showed hydrocephalus with mass effect on the fourth ventricle. Neurosurgery was involved and the patient was transferred to [facility] ICU for further evaluation.   Past Medical History   has a past medical history of Anxiety, Arthritis, Diverticulosis, GERD (gastroesophageal reflux disease), Hypertension, Osteopenia, Post-operative nausea and vomiting, and Pre-diabetes.  Significant  Hospital Events   5/18 admit with partial SBO and retained capsule endoscopy.  5/19 lap-assisted small bowel resection for retrieval of capsule.  5/24 gait abnormality prompted head CT concerning for mass. Transfer to [facility].   Consults:  Surgery Neurosurgery GI PCCM  Procedures:  5/19 lap-assisted small bowel resection for retrieval of capsule.   Significant Diagnostic Tests:  CT head 5/24 > Moderate hydrocephalus. No prior studies for comparison. Transependymal resorption of CSF in the white matter due to hydrocephalus There is mass-effect on the posterior fossa on the left. Fourth ventricle is displaced to the right and distorted.  MRI brain 5/24 > Left cerebellopontine angle mass, most consistent with vestibular schwannoma, causing mass effect on the cerebral aqua duct with resultant obstructive hydrocephalus of the lateral and third ventricles.  Micro Data:  none  Antimicrobials:   none  Interim history/subjective:  No complaints.   Objective   Blood pressure (!) 135/97, pulse 89, temperature 98 F (36.7 C), temperature source Oral, resp. rate 15, height 5\' 6"  (1.676 m), weight 55.8 kg, last menstrual period 05/01/2009, SpO2 100 %.        Intake/Output Summary (Last 24 hours) at 01/25/2020 1012 Last data filed at 01/25/2020 0700 Gross per 24 hour  Intake 820.58 ml  Output --  Net 820.58 ml   Filed Weights   01/18/20 1359 01/18/20 2006  Weight: 55.8 kg 55.8 kg    Examination: General: thin adult female in NAD HENT: [place]/AT, PERRL, no JVD Lungs: Clear bilateral breath sounds Cardiovascular: RRR, no MRG  Abdomen: Soft, NT, ND Extremities: No acute deformity Neuro: no focal deficits.   Resolved Hospital Problem list   n/a  Assessment & Plan:   Obstructive hydrocephalus: most  likely due to mass effect on the fourth ventricle in the setting of suspected brain mass. - Neurosurgery following and plans for resection later this week.  - Decadron 4mg  every 6  hours  Small bowel obstruction: secondary to retained capsule endoscopy, s/p small bowel resection and retrieval on 5/19 - management per surgery  Hypertension - Continue lopressor - No ACE-I due to possible GI angioedema, she has also requested to come off norvasc.   Anemia: Anemia panel showed ferritin 42, folate 2.7, borderline [id number] - Folic acid 1mg  daily, B12 1035mcg daily, Iron supplementation  Depression: Following the death of her husband in 2019-04-05 - Continue fluoxetine, buspirone - Ambien PRN for sleep  Prediabetes:  - SSI  Preoperative assessment for anticipated neurosurgery: Currently medically optimized for surgery: Cardiac risk - very low risk 0.4% ([facility] - [facility]) Pulmonary risk - moderate 13.3% ([facility]ISCAT) DVT risk - high risk Pollyann Samples)  Best practice:  Diet: REgular Pain/Anxiety/Delirium protocol (if indicated): prn only VAP protocol (if indicated): not intubated. Mobilize preop DVT prophylaxis: heparin tid. GI prophylaxis: n/a Glucose control: SSI while on decadron Mobility: PT for prehabilitation. Code Status: Full. Family Communication: patient advised. Disposition: ICU for now  Labs   CBC: Recent Labs  Lab 01/18/20 1538 01/23/20 0423 01/24/20 0346 01/24/20 1935  WBC 10.3 5.0 4.1 6.0  HGB 13.9 8.6* 8.8* 10.3*  HCT 45.1 27.8* 29.2* 33.8*  MCV 80.7 81.3 83.0 81.4  PLT 296 228 235 [id number]    Basic Metabolic Panel: Recent Labs  Lab 01/18/20 1538 01/23/20 0423 01/24/20 0346 01/24/20 1935  NA 140  --  141  --   K 3.4* 3.8 3.6  --   CL 104  --  107  --   CO2 25  --  26  --   GLUCOSE 139*  --  98  --   BUN 15  --  10  --   CREATININE 0.93 0.79 0.81 0.92  CALCIUM 9.9  --  8.5*  --   MG  --  1.9 2.2  --    GFR: Estimated Creatinine Clearance: 59.4 mL/min (by C-G formula based on SCr of 0.92 mg/dL). Recent Labs  Lab 01/18/20 1538 01/18/20 1540 01/23/20 0423 01/24/20 0346 01/24/20 1935  WBC 10.3  --  5.0 4.1 6.0  LATICACIDVEN  --  1.7  --    --   --     Liver Function Tests: Recent Labs  Lab 01/18/20 1538  AST 14*  ALT 14  ALKPHOS 64  BILITOT 0.9  PROT 7.4  ALBUMIN 4.4   Recent Labs  Lab 01/18/20 1538 01/23/20 1030 01/24/20 0346  LIPASE 33 26 26   No results for input(s): AMMONIA in the last 168 hours.  ABG No results found for: PHART, PCO2ART, PO2ART, HCO3, TCO2, ACIDBASEDEF, O2SAT   Coagulation Profile: No results for input(s): INR, PROTIME in the last 168 hours.  Cardiac Enzymes: No results for input(s): CKTOTAL, CKMB, CKMBINDEX, TROPONINI in the last 168 hours.  HbA1C: Hgb A1c MFr Bld  Date/Time Value Ref Range Status  01/23/2020 09:39 AM 5.4 4.8 - 5.6 % Final    Comment:    (NOTE) Pre diabetes:          5.7%-6.4% Diabetes:              >6.4% Glycemic control for   <7.0% adults with diabetes   08/13/2019 03:00 PM 5.7 (H) 4.8 - 5.6 % Final    Comment:    (NOTE) Pre diabetes:  5.7%-6.4% Diabetes:              >6.4% Glycemic control for   <7.0% adults with diabetes     CBG: Recent Labs  Lab 01/24/20 1949 01/24/20 2330 01/25/20 0340  GLUCAP 123* 181* 173*     Georgann Housekeeper, AGACNP-BC [facility]  See Amion for personal pager PCCM on call pager [phone]  01/25/2020 10:13 AM

## 2020-01-25 NOTE — Progress Notes (Signed)
Feels better this morning.  Less headache.  Speaking more easily.  On exam she is awake and alert.  She is oriented and appropriate.  Motor and sensory function are intact aside from hearing loss in her left ear.  I reviewed the patient's MRI scan of her brain.  This demonstrates evidence of a left-sided CP angle mass consistent with a significant left-sided vestibular schwannoma with marked compression of her left brainstem and resultant hydrocephalus secondary to compression of her cerebral aqueduct.  I discussed situation with my partner Dr.Ostergard.  I will transfer care to him and tentative plan is for him to perform surgical resection of this mass later this week.  I discussed this with the patient and she appears to understand and agrees with the plan.

## 2020-01-25 NOTE — Progress Notes (Signed)
Physical Therapy Treatment Patient Details Name: Cristina Conner MRN: [id number] DOB: 1961-01-06 Today's Date: 01/25/2020    History of Present Illness Pt is a 59 yo female s/p lap assisted SBR for retained capsule endoscope, Dr. Dema Severin, 5/19. PMH: HTN, L DA THA 2020 While at [facility] working with PT pt found to have L sided weakness and was found to have obstructive hydrocephaly due to a vestibular schwanoma. Per chart pt for planned surgery on 5/27 for resection of tumor and EVD placement.    PT Comments    Pt transferred from [facility] as she was found to have above tumor and is now planned for surgery on 5/27. Pt functioning currently at min guard/minA. Pt reports decreased ability to hear out of L ear. Pt with slow, guarded, shuffled gait pattern with RW. Pt experience lightheadedness with change in position but dissipates quickly. Acute PT to return s/p surgery as appropriate to re-assess patients mobility and d/c recommendations.    Follow Up Recommendations  Home health PT;Supervision/Assistance - 24 hour     Equipment Recommendations  None recommended by PT    Recommendations for Other Services       Precautions / Restrictions Precautions Precautions: Fall Restrictions Weight Bearing Restrictions: No    Mobility  Bed Mobility Overal bed mobility: Needs Assistance Bed Mobility: Rolling;Sidelying to Sit Rolling: Min guard Sidelying to sit: Min guard;HOB elevated(at about 30 deg)       General bed mobility comments: pt reports no abdominal pain with log roll technique, discussed transfer back in the bed using log roll technique as well  Transfers Overall transfer level: Needs assistance Equipment used: Rolling walker (2 wheeled) Transfers: Sit to/from Stand Sit to Stand: Min guard         General transfer comment: min guard due onset of light headedness with change in position, dissipates quickly  Ambulation/Gait Ambulation/Gait assistance: Min assist Gait Distance  (Feet): 200 Feet Assistive device: Rolling walker (2 wheeled) Gait Pattern/deviations: Shuffle;Decreased stride length Gait velocity: decreased Gait velocity interpretation: <1.31 ft/sec, indicative of household ambulator General Gait Details: short shuffled steps, verbal cues to increase step length, minA for walker management around obstacles, pt very guarded and wants to keep feet on the ground, hence the shuffling short step length pattern   Stairs             Wheelchair Mobility    Modified Rankin (Stroke Patients Only)       Balance Overall balance assessment: Needs assistance Sitting-balance support: No upper extremity supported;Feet supported Sitting balance-Leahy Scale: Good     Standing balance support: Bilateral upper extremity supported;During functional activity Standing balance-Leahy Scale: Fair Standing balance comment: requires RW for safe amb                            Cognition Arousal/Alertness: Awake/alert Behavior During Therapy: WFL for tasks assessed/performed Overall Cognitive Status: Within Functional Limits for tasks assessed                                        Exercises General Exercises - Lower Extremity Gluteal Sets: AROM;Both;10 reps;Seated Long Arc Quad: AROM;Both;10 reps;Seated Hip ABduction/ADduction: AROM;Both;10 reps;Seated Hip Flexion/Marching: AROM;Both;10 reps;Seated Toe Raises: AROM;10 reps;Seated;Both Heel Raises: AROM;Both;10 reps;Seated    General Comments General comments (skin integrity, edema, etc.): VSS      Pertinent Vitals/Pain Pain Assessment: 0-10  Pain Score: 2  Pain Location: abdominal soreness Pain Descriptors / Indicators: Discomfort;Sore Pain Intervention(s): Monitored during session    Home Living                      Prior Function            PT Goals (current goals can now be found in the care plan section) Progress towards PT goals: Progressing toward  goals    Frequency    Min 3X/week      PT Plan Current plan remains appropriate    Co-evaluation              AM-PAC PT "6 Clicks" Mobility   Outcome Measure  Help needed turning from your back to your side while in a flat bed without using bedrails?: A Little Help needed moving from lying on your back to sitting on the side of a flat bed without using bedrails?: A Little Help needed moving to and from a bed to a chair (including a wheelchair)?: A Little Help needed standing up from a chair using your arms (e.g., wheelchair or bedside chair)?: A Little Help needed to walk in hospital room?: A Little Help needed climbing 3-5 steps with a railing? : A Little 6 Click Score: 18    End of Session Equipment Utilized During Treatment: Gait belt Activity Tolerance: Patient tolerated treatment well Patient left: in chair;with call bell/phone within reach;with family/visitor present Nurse Communication: Mobility status PT Visit Diagnosis: Difficulty in walking, not elsewhere classified (R26.2);Other abnormalities of gait and mobility (R26.89);Unsteadiness on feet (R26.81)     Time: [id number] PT Time Calculation (min) (ACUTE ONLY): 29 min  Charges:  $Gait Training: 8-22 mins $Therapeutic Exercise: 8-22 mins                     Kittie Plater, PT, DPT Acute Rehabilitation Services Pager #: [phone] Office #: [phone]    Berline Lopes 01/25/2020, 11:01 AM

## 2020-01-26 ENCOUNTER — Ambulatory Visit: Payer: BC Managed Care – PPO | Admitting: Psychology

## 2020-01-26 LAB — GLUCOSE, CAPILLARY
Glucose-Capillary: 128 mg/dL — ABNORMAL HIGH (ref 70–99)
Glucose-Capillary: 129 mg/dL — ABNORMAL HIGH (ref 70–99)
Glucose-Capillary: 129 mg/dL — ABNORMAL HIGH (ref 70–99)
Glucose-Capillary: 131 mg/dL — ABNORMAL HIGH (ref 70–99)

## 2020-01-26 LAB — CBC
HCT: 31.2 % — ABNORMAL LOW (ref 36.0–46.0)
Hemoglobin: 9.6 g/dL — ABNORMAL LOW (ref 12.0–15.0)
MCH: 25.2 pg — ABNORMAL LOW (ref 26.0–34.0)
MCHC: 30.8 g/dL (ref 30.0–36.0)
MCV: 81.9 fL (ref 80.0–100.0)
Platelets: 419 10*3/uL — ABNORMAL HIGH (ref 150–400)
RBC: 3.81 MIL/uL — ABNORMAL LOW (ref 3.87–5.11)
RDW: 19.3 % — ABNORMAL HIGH (ref 11.5–15.5)
WBC: 15.5 10*3/uL — ABNORMAL HIGH (ref 4.0–10.5)
nRBC: 0 % (ref 0.0–0.2)

## 2020-01-26 LAB — BASIC METABOLIC PANEL
Anion gap: 10 (ref 5–15)
BUN: 13 mg/dL (ref 6–20)
CO2: 24 mmol/L (ref 22–32)
Calcium: 9.4 mg/dL (ref 8.9–10.3)
Chloride: 106 mmol/L (ref 98–111)
Creatinine, Ser: 0.84 mg/dL (ref 0.44–1.00)
GFR calc Af Amer: >60 mL/min (ref >60)
GFR calc non Af Amer: >60 mL/min (ref >60)
Glucose, Bld: 133 mg/dL — ABNORMAL HIGH (ref 70–99)
Potassium: 3.9 mmol/L (ref 3.5–5.1)
Sodium: 140 mmol/L (ref 135–145)

## 2020-01-26 LAB — MAGNESIUM: Magnesium: 2.1 mg/dL (ref 1.7–2.4)

## 2020-01-26 LAB — PHOSPHORUS: Phosphorus: 3.4 mg/dL (ref 2.5–4.6)

## 2020-01-26 NOTE — Progress Notes (Signed)
Chaplain responded to request for Notary.   Chaplain spoke briefly with patient and her daughter.  Chaplain notarized AD/HCPOA.  Chaplain invited patient and daughter to call on Chaplain at any time of day or night if there is any need.  De Burrs Chaplain Resident

## 2020-01-26 NOTE — Progress Notes (Signed)
The chaplain responded to a consult for an AD. The chaplain spoke with the patient about advanced directives and the patient expressed a desire to talk it over with family. The chaplain let the patient know that if they had any questions or when they finished the document to contact spiritual care. The chaplain prayed for the patient. The chaplain is available if needed.  Brion Aliment Chaplain Resident For questions concerning this note please contact me by pager [phone]

## 2020-01-26 NOTE — Progress Notes (Signed)
Neurosurgery Service Progress Note  Subjective: No acute events overnight, had BM yesterday, appetite good  Objective: Vitals:   01/26/20 0300 01/26/20 0400 01/26/20 0500 01/26/20 0600  BP: 132/76 138/81 137/79 135/87  Pulse: 71 73 72 78  Resp: 10   16  Temp:  99 F (37.2 C)    TempSrc:  Axillary    SpO2: 100% 99% 100% 100%  Weight:      Height:       Temp (24hrs), Avg:98.8 F (37.1 C), Min:98.1 F (36.7 C), Max:99 F (37.2 C)  CBC Latest Ref Rng & Units 01/26/2020 01/24/2020 01/24/2020  WBC 4.0 - 10.5 K/uL 15.5(H) 6.0 4.1  Hemoglobin 12.0 - 15.0 g/dL 9.6(L) 10.3(L) 8.8(L)  Hematocrit 36.0 - 46.0 % 31.2(L) 33.8(L) 29.2(L)  Platelets 150 - 400 K/uL 419(H) 334 235   BMP Latest Ref Rng & Units 01/26/2020 01/24/2020 01/24/2020  Glucose 70 - 99 mg/dL 133(H) - 98  BUN 6 - 20 mg/dL 13 - 10  Creatinine 0.44 - 1.00 mg/dL 0.84 0.92 0.81  BUN/Creat Ratio 9 - 23 - - -  Sodium 135 - 145 mmol/L 140 - 141  Potassium 3.5 - 5.1 mmol/L 3.9 - 3.6  Chloride 98 - 111 mmol/L 106 - 107  CO2 22 - 32 mmol/L 24 - 26  Calcium 8.9 - 10.3 mg/dL 9.4 - 8.5(L)    Intake/Output Summary (Last 24 hours) at 01/26/2020 0751 Last data filed at 01/25/2020 1042 Gross per 24 hour  Intake 3 ml  Output --  Net 3 ml    Current Facility-Administered Medications:  .  0.9 %  sodium chloride infusion, 250 mL, Intravenous, PRN, Michael Boston, MD, Last Rate: 125 mL/hr at 01/25/20 1258, Rate Change at 01/25/20 1258 .  acetaminophen (TYLENOL) tablet 1,000 mg, 1,000 mg, Oral, Q6H, Ileana Roup, MD, 1,000 mg at 01/26/20 0547 .  alum & mag hydroxide-simeth (MAALOX/MYLANTA) 200-200-20 MG/5ML suspension 30 mL, 30 mL, Oral, Q6H PRN, Michael Boston, MD, 30 mL at 01/22/20 1947 .  bisacodyl (DULCOLAX) suppository 10 mg, 10 mg, Rectal, Q12H PRN, Michael Boston, MD, 10 mg at 01/22/20 1147 .  busPIRone (BUSPAR) tablet 5 mg, 5 mg, Oral, TID, Dwyane Dee, MD, 5 mg at 01/25/20 2114 .  Chlorhexidine Gluconate Cloth 2 % PADS 6  each, 6 each, Topical, Daily, Kipp Brood, MD, 6 each at 01/25/20 1041 .  dexamethasone (DECADRON) injection 4 mg, 4 mg, Intravenous, Q6H, Rai, Ripudeep K, MD, 4 mg at 01/26/20 0548 .  dicyclomine (BENTYL) capsule 10 mg, 10 mg, Oral, TID Rogelia Mire, MD, 10 mg at 01/25/20 1747 .  diphenhydrAMINE (BENADRYL) capsule 25 mg, 25 mg, Oral, Q6H PRN **OR** diphenhydrAMINE (BENADRYL) injection 25 mg, 25 mg, Intravenous, Q6H PRN, Ileana Roup, MD .  famotidine (PEPCID) tablet 40 mg, 40 mg, Oral, Daily, Michael Boston, MD, 40 mg at 01/25/20 1038 .  FLUoxetine (PROZAC) capsule 10 mg, 10 mg, Oral, Daily, Michael Boston, MD, 10 mg at 01/25/20 1038 .  folic acid (FOLVITE) tablet 1 mg, 1 mg, Oral, Daily, Gross, Remo Lipps, MD, 1 mg at 01/25/20 1038 .  heparin injection 5,000 Units, 5,000 Units, Subcutaneous, Q8H, Agarwala, Ravi, MD, 5,000 Units at 01/26/20 0550 .  hydrALAZINE (APRESOLINE) tablet 25 mg, 25 mg, Oral, Q4H PRN, Dwyane Dee, MD, 25 mg at 01/24/20 2003 .  HYDROmorphone (DILAUDID) injection 0.5 mg, 0.5 mg, Intravenous, Q3H PRN, Ileana Roup, MD .  ibuprofen (ADVIL) tablet 600 mg, 600 mg, Oral, Q6H PRN, Ileana Roup,  MD, 600 mg at 01/25/20 0821 .  insulin aspart (novoLOG) injection 1-3 Units, 1-3 Units, Subcutaneous, Q4H, Kipp Brood, MD, 2 Units at 01/26/20 0014 .  iron polysaccharides (NIFEREX) capsule 150 mg, 150 mg, Oral, Daily, Michael Boston, MD, 150 mg at 01/25/20 1038 .  magic mouthwash, 15 mL, Oral, QID PRN, Michael Boston, MD .  methocarbamol (ROBAXIN) 1,000 mg in dextrose 5 % 100 mL IVPB, 1,000 mg, Intravenous, Q6H PRN, Michael Boston, MD, Last Rate: 200 mL/hr at 01/23/20 1949, 1,000 mg at 01/23/20 1949 .  metoprolol tartrate (LOPRESSOR) tablet 25 mg, 25 mg, Oral, BID, Rai, Ripudeep K, MD, 25 mg at 01/25/20 2113 .  multivitamin with minerals tablet 1 tablet, 1 tablet, Oral, Daily, Dwyane Dee, MD, 1 tablet at 01/25/20 1037 .  ondansetron (ZOFRAN-ODT)  disintegrating tablet 4 mg, 4 mg, Oral, Q6H PRN **OR** ondansetron (ZOFRAN) injection 4 mg, 4 mg, Intravenous, Q6H PRN, Ileana Roup, MD, 4 mg at 01/23/20 0936 .  polyethylene glycol (MIRALAX / GLYCOLAX) packet 17 g, 17 g, Oral, Q12H PRN, Michael Boston, MD, 17 g at 01/25/20 1831 .  polyethylene glycol (MIRALAX / GLYCOLAX) packet 17 g, 17 g, Oral, Daily, Corey Harold, NP .  simethicone (MYLICON) 40 [id number] suspension 40 mg, 40 mg, Oral, QID PRN, Michael Boston, MD .  sodium chloride flush (NS) 0.9 % injection 3 mL, 3 mL, Intravenous, Once, White, Taiya Mt, MD .  sodium chloride flush (NS) 0.9 % injection 3 mL, 3 mL, Intravenous, Q12H, Michael Boston, MD, 3 mL at 01/26/20 0551 .  sodium chloride flush (NS) 0.9 % injection 3 mL, 3 mL, Intravenous, PRN, Michael Boston, MD .  traMADol Veatrice Bourbon) tablet 50 mg, 50 mg, Oral, Q6H PRN, Ileana Roup, MD, 50 mg at 01/24/20 0818 .  vitamin B-12 (CYANOCOBALAMIN) tablet 1,000 mcg, 1,000 mcg, Oral, Daily, Dwyane Dee, MD, 1,000 mcg at 01/25/20 1037 .  zolpidem (AMBIEN) tablet 5 mg, 5 mg, Oral, QHS PRN,MR X 1, Gross, Remo Lipps, MD   Physical Exam: AOx3, PERRL, EOMI, +dec'd hearing on L, FS, Strength 5/5 x4, SILTx4  Assessment & Plan: 59 y.o. woman s/p small bowel resection w/ post-op ataxia. 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  -OR 5/27 for resection and EVD placement -since she's going to the OR tomorrow and will return to the unit post-op due to ventric, will d/'c transfer orders -d/c dex given her recent bowel anastamosis -d/c SQH, NPO p MN for OR tomorrow  Judith Part  01/26/20 7:51 AM

## 2020-01-27 ENCOUNTER — Inpatient Hospital Stay (HOSPITAL_COMMUNITY)
Admission: EM | Disposition: A | Discharge status: Rehabilitation Facility | Payer: Self-pay | Source: Home / Self Care | Attending: Neurological Surgery

## 2020-01-27 ENCOUNTER — Other Ambulatory Visit: Payer: Self-pay

## 2020-01-27 ENCOUNTER — Inpatient Hospital Stay (HOSPITAL_COMMUNITY): Payer: BC Managed Care – PPO | Admitting: Certified Registered"

## 2020-01-27 DIAGNOSIS — D496 Neoplasm of unspecified behavior of brain: Secondary | ICD-10-CM | POA: Diagnosis present

## 2020-01-27 HISTORY — PX: CRANIOTOMY: SHX93

## 2020-01-27 HISTORY — PX: VENTRICULOSTOMY: SHX5377

## 2020-01-27 LAB — GLUCOSE, CAPILLARY
Glucose-Capillary: 108 mg/dL — ABNORMAL HIGH (ref 70–99)
Glucose-Capillary: 122 mg/dL — ABNORMAL HIGH (ref 70–99)
Glucose-Capillary: 128 mg/dL — ABNORMAL HIGH (ref 70–99)
Glucose-Capillary: 182 mg/dL — ABNORMAL HIGH (ref 70–99)
Glucose-Capillary: 81 mg/dL (ref 70–99)
Glucose-Capillary: 90 mg/dL (ref 70–99)

## 2020-01-27 LAB — CREATININE, SERUM
Creatinine, Ser: 0.78 mg/dL (ref 0.44–1.00)
GFR calc Af Amer: >60 mL/min (ref >60)
GFR calc non Af Amer: >60 mL/min (ref >60)

## 2020-01-27 SURGERY — CRANIOTOMY TUMOR EXCISION
Anesthesia: General | Site: Head | Laterality: Left

## 2020-01-27 MED ORDER — MIDAZOLAM HCL 2 MG/2ML IJ SOLN
INTRAMUSCULAR | Status: AC
  Filled 2020-01-27: qty 2

## 2020-01-27 MED ORDER — BACITRACIN ZINC 500 UNIT/GM EX OINT
TOPICAL_OINTMENT | CUTANEOUS | Status: AC
  Filled 2020-01-27: qty 28.35

## 2020-01-27 MED ORDER — FENTANYL CITRATE (PF) 100 MCG/2ML IJ SOLN
INTRAMUSCULAR | Status: DC | PRN
  Administered 2020-01-27 (×3): 50 ug via INTRAVENOUS
  Administered 2020-01-27: 150 ug via INTRAVENOUS
  Administered 2020-01-27: 100 ug via INTRAVENOUS
  Administered 2020-01-27: 50 ug via INTRAVENOUS

## 2020-01-27 MED ORDER — PROMETHAZINE HCL 25 MG PO TABS: 12.5000 mg | ORAL_TABLET | ORAL | Status: DC | PRN

## 2020-01-27 MED ORDER — LIDOCAINE 2% (20 MG/ML) 5 ML SYRINGE
INTRAMUSCULAR | Status: DC | PRN
  Administered 2020-01-27: 100 mg via INTRAVENOUS

## 2020-01-27 MED ORDER — BACITRACIN ZINC 500 UNIT/GM EX OINT
TOPICAL_OINTMENT | CUTANEOUS | Status: DC | PRN
  Administered 2020-01-27: 1 via TOPICAL

## 2020-01-27 MED ORDER — LIDOCAINE-EPINEPHRINE 1 %-1:100000 IJ SOLN
INTRAMUSCULAR | Status: DC | PRN
  Administered 2020-01-27: 10 mL

## 2020-01-27 MED ORDER — SUGAMMADEX SODIUM 200 MG/2ML IV SOLN
INTRAVENOUS | Status: DC | PRN
  Administered 2020-01-27: 200 mg via INTRAVENOUS

## 2020-01-27 MED ORDER — LIDOCAINE 2% (20 MG/ML) 5 ML SYRINGE
INTRAMUSCULAR | Status: AC
  Filled 2020-01-27: qty 5

## 2020-01-27 MED ORDER — CEFAZOLIN SODIUM-DEXTROSE 2-3 GM-%(50ML) IV SOLR
INTRAVENOUS | Status: DC | PRN
  Administered 2020-01-27 (×2): 2 g via INTRAVENOUS

## 2020-01-27 MED ORDER — HEMOSTATIC AGENTS (NO CHARGE) OPTIME
TOPICAL | Status: DC | PRN
  Administered 2020-01-27: 1 via TOPICAL

## 2020-01-27 MED ORDER — EPHEDRINE 5 MG/ML INJ
INTRAVENOUS | Status: AC
  Filled 2020-01-27: qty 10

## 2020-01-27 MED ORDER — PROPOFOL 10 MG/ML IV BOLUS
INTRAVENOUS | Status: AC
  Filled 2020-01-27: qty 20

## 2020-01-27 MED ORDER — THROMBIN 20000 UNITS EX SOLR: CUTANEOUS | Status: DC | PRN

## 2020-01-27 MED ORDER — ALBUMIN HUMAN 5 % IV SOLN: INTRAVENOUS | Status: DC | PRN

## 2020-01-27 MED ORDER — CHLORHEXIDINE GLUCONATE 0.12 % MT SOLN: 15.0000 mL | Freq: Once | OROMUCOSAL | Status: AC

## 2020-01-27 MED ORDER — ACETAMINOPHEN 325 MG PO TABS: 650.0000 mg | ORAL_TABLET | ORAL | Status: DC | PRN

## 2020-01-27 MED ORDER — CHLORHEXIDINE GLUCONATE 0.12 % MT SOLN
OROMUCOSAL | Status: AC
  Administered 2020-01-27: 15 mL via OROMUCOSAL
  Filled 2020-01-27: qty 15

## 2020-01-27 MED ORDER — THROMBIN 20000 UNITS EX SOLR
CUTANEOUS | Status: AC
  Filled 2020-01-27: qty 20000

## 2020-01-27 MED ORDER — THROMBIN 5000 UNITS EX SOLR
CUTANEOUS | Status: AC
  Filled 2020-01-27: qty 10000

## 2020-01-27 MED ORDER — HYDROMORPHONE HCL 1 MG/ML IJ SOLN
0.5000 mg | INTRAMUSCULAR | Status: DC | PRN
  Administered 2020-01-28 (×2): 0.5 mg via INTRAVENOUS
  Filled 2020-01-27 (×2): qty 1

## 2020-01-27 MED ORDER — SUCCINYLCHOLINE CHLORIDE 200 MG/10ML IV SOSY
PREFILLED_SYRINGE | INTRAVENOUS | Status: AC
  Filled 2020-01-27: qty 10

## 2020-01-27 MED ORDER — EPHEDRINE SULFATE-NACL 50-0.9 MG/10ML-% IV SOSY
PREFILLED_SYRINGE | INTRAVENOUS | Status: DC | PRN
  Administered 2020-01-27: 10 mg via INTRAVENOUS

## 2020-01-27 MED ORDER — CEFAZOLIN SODIUM-DEXTROSE 2-4 GM/100ML-% IV SOLN
2.0000 g | Freq: Three times a day (TID) | INTRAVENOUS | Status: AC
  Administered 2020-01-28 (×2): 2 g via INTRAVENOUS
  Filled 2020-01-27 (×2): qty 100

## 2020-01-27 MED ORDER — THROMBIN 5000 UNITS EX SOLR: OROMUCOSAL | Status: DC | PRN

## 2020-01-27 MED ORDER — HYDROCODONE-ACETAMINOPHEN 5-325 MG PO TABS: 1.0000 | ORAL_TABLET | ORAL | Status: DC | PRN

## 2020-01-27 MED ORDER — LIDOCAINE-EPINEPHRINE 1 %-1:100000 IJ SOLN
INTRAMUSCULAR | Status: AC
  Filled 2020-01-27: qty 1

## 2020-01-27 MED ORDER — FAMOTIDINE IN NACL 20-0.9 MG/50ML-% IV SOLN
20.0000 mg | Freq: Two times a day (BID) | INTRAVENOUS | Status: DC
  Administered 2020-01-27 – 2020-01-29 (×5): 20 mg via INTRAVENOUS
  Filled 2020-01-27 (×5): qty 50

## 2020-01-27 MED ORDER — PROPOFOL 10 MG/ML IV BOLUS
INTRAVENOUS | Status: DC | PRN
  Administered 2020-01-27 (×2): 50 mg via INTRAVENOUS
  Administered 2020-01-27: 100 mg via INTRAVENOUS

## 2020-01-27 MED ORDER — THROMBIN 5000 UNITS EX SOLR
CUTANEOUS | Status: AC
  Filled 2020-01-27: qty 5000

## 2020-01-27 MED ORDER — ROCURONIUM BROMIDE 10 MG/ML (PF) SYRINGE
PREFILLED_SYRINGE | INTRAVENOUS | Status: DC | PRN
  Administered 2020-01-27: 80 mg via INTRAVENOUS

## 2020-01-27 MED ORDER — SODIUM CHLORIDE 0.9 % IV SOLN: INTRAVENOUS | Status: DC

## 2020-01-27 MED ORDER — ONDANSETRON HCL 4 MG PO TABS: 4.0000 mg | ORAL_TABLET | ORAL | Status: DC | PRN

## 2020-01-27 MED ORDER — DOCUSATE SODIUM 100 MG PO CAPS
100.0000 mg | ORAL_CAPSULE | Freq: Two times a day (BID) | ORAL | Status: DC
  Filled 2020-01-27: qty 1

## 2020-01-27 MED ORDER — PHENYLEPHRINE HCL-NACL 10-0.9 MG/250ML-% IV SOLN
INTRAVENOUS | Status: DC | PRN
  Administered 2020-01-27: 50 ug/min via INTRAVENOUS

## 2020-01-27 MED ORDER — 0.9 % SODIUM CHLORIDE (POUR BTL) OPTIME
TOPICAL | Status: DC | PRN
  Administered 2020-01-27 (×3): 1000 mL

## 2020-01-27 MED ORDER — LABETALOL HCL 5 MG/ML IV SOLN
10.0000 mg | INTRAVENOUS | Status: DC | PRN
  Administered 2020-01-28 (×2): 20 mg via INTRAVENOUS
  Filled 2020-01-27 (×2): qty 4

## 2020-01-27 MED ORDER — ACETAMINOPHEN 650 MG RE SUPP: 650.0000 mg | RECTAL | Status: DC | PRN

## 2020-01-27 MED ORDER — FENTANYL CITRATE (PF) 250 MCG/5ML IJ SOLN
INTRAMUSCULAR | Status: AC
  Filled 2020-01-27: qty 5

## 2020-01-27 MED ORDER — ONDANSETRON HCL 4 MG/2ML IJ SOLN
INTRAMUSCULAR | Status: DC | PRN
  Administered 2020-01-27: 4 mg via INTRAVENOUS

## 2020-01-27 MED ORDER — MIDAZOLAM HCL 5 MG/5ML IJ SOLN
INTRAMUSCULAR | Status: DC | PRN
  Administered 2020-01-27: 2 mg via INTRAVENOUS

## 2020-01-27 MED ORDER — SODIUM CHLORIDE 0.9 % IR SOLN
Status: DC | PRN
  Administered 2020-01-27: 1000 mL

## 2020-01-27 MED ORDER — POLYETHYLENE GLYCOL 3350 17 G PO PACK: 17.0000 g | PACK | Freq: Every day | ORAL | Status: DC | PRN

## 2020-01-27 MED ORDER — CEFAZOLIN SODIUM-DEXTROSE 2-4 GM/100ML-% IV SOLN
INTRAVENOUS | Status: AC
  Filled 2020-01-27: qty 100

## 2020-01-27 MED ORDER — ONDANSETRON HCL 4 MG/2ML IJ SOLN
4.0000 mg | INTRAMUSCULAR | Status: DC | PRN
  Administered 2020-01-28 (×3): 4 mg via INTRAVENOUS
  Filled 2020-01-27 (×3): qty 2

## 2020-01-27 MED ORDER — HEPARIN SODIUM (PORCINE) 5000 UNIT/ML IJ SOLN
5000.0000 [IU] | Freq: Three times a day (TID) | INTRAMUSCULAR | Status: DC
  Administered 2020-01-29 – 2020-02-02 (×15): 5000 [IU] via SUBCUTANEOUS
  Filled 2020-01-27 (×16): qty 1

## 2020-01-27 SURGICAL SUPPLY — 104 items
APL SKNCLS STERI-STRIP NONHPOA (GAUZE/BANDAGES/DRESSINGS)
BAND INSRT 18 STRL LF DISP RB (MISCELLANEOUS) ×2
BAND RUBBER #18 3X1/16 STRL (MISCELLANEOUS) ×2 IMPLANT
BENZOIN TINCTURE PRP APPL 2/3 (GAUZE/BANDAGES/DRESSINGS) IMPLANT
BLADE CLIPPER SURG (BLADE) ×2 IMPLANT
BLADE SAW GIGLI 16 STRL (MISCELLANEOUS) IMPLANT
BLADE SURG 15 STRL LF DISP TIS (BLADE) IMPLANT
BLADE SURG 15 STRL SS (BLADE)
BNDG CMPR 75X41 PLY HI ABS (GAUZE/BANDAGES/DRESSINGS)
BNDG GAUZE ELAST 4 BULKY (GAUZE/BANDAGES/DRESSINGS) IMPLANT
BNDG STRETCH 4X75 STRL LF (GAUZE/BANDAGES/DRESSINGS) IMPLANT
BUR ACORN 9.0 PRECISION (BURR) ×2 IMPLANT
BUR ROUND FLUTED 4 SOFT TCH (BURR) IMPLANT
BUR SPIRAL ROUTER 2.3 (BUR) ×2 IMPLANT
CANISTER SUCT 3000ML PPV (MISCELLANEOUS) ×4 IMPLANT
CATH VENTRIC 35X38 W/TROCAR LG (CATHETERS) ×1 IMPLANT
CLIP VESOCCLUDE MED 6/CT (CLIP) IMPLANT
CNTNR URN SCR LID CUP LEK RST (MISCELLANEOUS) ×1 IMPLANT
CONT SPEC 4OZ STRL OR WHT (MISCELLANEOUS) ×4
COVER BURR HOLE 20 W/TAB UNI (Plate) ×1 IMPLANT
COVER MAYO STAND STRL (DRAPES) IMPLANT
COVER WAND RF STERILE (DRAPES) ×1 IMPLANT
DECANTER SPIKE VIAL GLASS SM (MISCELLANEOUS) ×2 IMPLANT
DRAIN SUBARACHNOID (WOUND CARE) IMPLANT
DRAPE HALF SHEET 40X57 (DRAPES) ×2 IMPLANT
DRAPE MICROSCOPE LEICA (MISCELLANEOUS) ×1 IMPLANT
DRAPE NEUROLOGICAL W/INCISE (DRAPES) ×2 IMPLANT
DRAPE STERI IOBAN 125X83 (DRAPES) IMPLANT
DRAPE SURG 17X23 STRL (DRAPES) IMPLANT
DRAPE WARM FLUID 44X44 (DRAPES) ×2 IMPLANT
DRSG ADAPTIC 3X8 NADH LF (GAUZE/BANDAGES/DRESSINGS) IMPLANT
DRSG TELFA 3X8 NADH (GAUZE/BANDAGES/DRESSINGS) IMPLANT
DURAPREP 6ML APPLICATOR 50/CS (WOUND CARE) ×2 IMPLANT
ELECT CAUTERY BLADE 6.4 (BLADE) ×1 IMPLANT
ELECT REM PT RETURN 9FT ADLT (ELECTROSURGICAL) ×2
ELECTRODE REM PT RTRN 9FT ADLT (ELECTROSURGICAL) ×1 IMPLANT
EVACUATOR 1/8 PVC DRAIN (DRAIN) IMPLANT
EVACUATOR SILICONE 100CC (DRAIN) IMPLANT
FEE INTRAOP MONITOR IMPULS NCS (MISCELLANEOUS) IMPLANT
FORCEPS BIPOLAR SPETZLER 8 1.0 (NEUROSURGERY SUPPLIES) ×2 IMPLANT
GAUZE 4X4 16PLY RFD (DISPOSABLE) IMPLANT
GAUZE SPONGE 4X4 12PLY STRL (GAUZE/BANDAGES/DRESSINGS) IMPLANT
GLOVE BIO SURGEON STRL SZ7 (GLOVE) IMPLANT
GLOVE BIO SURGEON STRL SZ7.5 (GLOVE) ×3 IMPLANT
GLOVE BIOGEL PI IND STRL 7.0 (GLOVE) IMPLANT
GLOVE BIOGEL PI IND STRL 7.5 (GLOVE) ×1 IMPLANT
GLOVE BIOGEL PI INDICATOR 7.0 (GLOVE)
GLOVE BIOGEL PI INDICATOR 7.5 (GLOVE) ×5
GLOVE EXAM NITRILE LRG STRL (GLOVE) IMPLANT
GLOVE EXAM NITRILE XL STR (GLOVE) IMPLANT
GLOVE EXAM NITRILE XS STR PU (GLOVE) IMPLANT
GLOVE SURG SS PI 7.0 STRL IVOR (GLOVE) ×4 IMPLANT
GOWN STRL REUS W/ TWL LRG LVL3 (GOWN DISPOSABLE) ×2 IMPLANT
GOWN STRL REUS W/ TWL XL LVL3 (GOWN DISPOSABLE) IMPLANT
GOWN STRL REUS W/TWL 2XL LVL3 (GOWN DISPOSABLE) IMPLANT
GOWN STRL REUS W/TWL LRG LVL3 (GOWN DISPOSABLE) ×4
GOWN STRL REUS W/TWL XL LVL3 (GOWN DISPOSABLE) ×6
GRAFT DURAGEN MATRIX 2WX2L ×1 IMPLANT
HEMOSTAT POWDER KIT SURGIFOAM (HEMOSTASIS) ×3 IMPLANT
HEMOSTAT SURGICEL 2X14 (HEMOSTASIS) ×2 IMPLANT
HOOK DURA 1/2IN (MISCELLANEOUS) ×1 IMPLANT
INTRAOP MONITOR FEE IMPULS NCS (MISCELLANEOUS) ×1
INTRAOP MONITOR FEE IMPULSE (MISCELLANEOUS) ×2
IV NS 1000ML (IV SOLUTION) ×2
IV NS 1000ML BAXH (IV SOLUTION) ×1 IMPLANT
KIT BASIN OR (CUSTOM PROCEDURE TRAY) ×2 IMPLANT
KIT DRAIN CSF ACCUDRAIN (MISCELLANEOUS) ×1 IMPLANT
KIT TURNOVER KIT B (KITS) ×2 IMPLANT
NDL SPNL 18GX3.5 QUINCKE PK (NEEDLE) IMPLANT
NEEDLE HYPO 22GX1.5 SAFETY (NEEDLE) ×2 IMPLANT
NEEDLE SPNL 18GX3.5 QUINCKE PK (NEEDLE) IMPLANT
NS IRRIG 1000ML POUR BTL (IV SOLUTION) ×6 IMPLANT
PACK BATTERY CMF DISP FOR DVR (ORTHOPEDIC DISPOSABLE SUPPLIES) ×1 IMPLANT
PACK CRANIOTOMY CUSTOM (CUSTOM PROCEDURE TRAY) ×2 IMPLANT
PAD DRESSING TELFA 3X8 NADH (GAUZE/BANDAGES/DRESSINGS) IMPLANT
PATTIES SURGICAL .25X.25 (GAUZE/BANDAGES/DRESSINGS) IMPLANT
PATTIES SURGICAL .5 X.5 (GAUZE/BANDAGES/DRESSINGS) IMPLANT
PATTIES SURGICAL .5 X3 (DISPOSABLE) IMPLANT
PATTIES SURGICAL 1/4 X 3 (GAUZE/BANDAGES/DRESSINGS) IMPLANT
PATTIES SURGICAL 1X1 (DISPOSABLE) IMPLANT
PENCIL BUTTON HOLSTER BLD 10FT (ELECTRODE) ×1 IMPLANT
PIN MAYFIELD SKULL DISP (PIN) ×2 IMPLANT
PROBE NERVBE PRASS .33 (MISCELLANEOUS) ×1 IMPLANT
SCREW UNIII AXS SD 1.5X4 (Screw) ×4 IMPLANT
SEALANT ADHERUS EXTEND TIP (MISCELLANEOUS) ×1 IMPLANT
SET CARTRIDGE AND TUBING (SET/KITS/TRAYS/PACK) ×1 IMPLANT
SPECIMEN JAR SMALL (MISCELLANEOUS) IMPLANT
SPONGE NEURO XRAY DETECT 1X3 (DISPOSABLE) IMPLANT
SPONGE SURGIFOAM ABS GEL 100 (HEMOSTASIS) ×2 IMPLANT
STAPLER VISISTAT 35W (STAPLE) ×2 IMPLANT
SUT ETHILON 3 0 FSL (SUTURE) IMPLANT
SUT ETHILON 3 0 PS 1 (SUTURE) IMPLANT
SUT MNCRL AB 3-0 PS2 18 (SUTURE) ×1 IMPLANT
SUT NURALON 4 0 TR CR/8 (SUTURE) ×5 IMPLANT
SUT SILK 0 TIES 10X30 (SUTURE) IMPLANT
SUT VIC AB 2-0 CP2 18 (SUTURE) ×3 IMPLANT
TIP SHEAR CVD EXTENDED 36KH (INSTRUMENTS) ×1 IMPLANT
TOWEL GREEN STERILE (TOWEL DISPOSABLE) ×2 IMPLANT
TOWEL GREEN STERILE FF (TOWEL DISPOSABLE) ×2 IMPLANT
TRAY FOLEY MTR SLVR 16FR STAT (SET/KITS/TRAYS/PACK) ×2 IMPLANT
TUBE CONNECTING 12X1/4 (SUCTIONS) ×1 IMPLANT
UNDERPAD 30X36 HEAVY ABSORB (UNDERPADS AND DIAPERS) ×2 IMPLANT
WATER STERILE IRR 1000ML POUR (IV SOLUTION) ×2 IMPLANT
WRENCH TORQUE 36KHZ (INSTRUMENTS) ×1 IMPLANT

## 2020-01-27 NOTE — Op Note (Signed)
PATIENT: Cristina Conner  DAY OF SURGERY: 01/27/20   PRE-OPERATIVE DIAGNOSIS:  Left vestibular schwannoma, hydrocephalus   POST-OPERATIVE DIAGNOSIS:  Same   PROCEDURE:  Left occipital ventriculostomy placement, left retrosigmoid craniotomy for resection of CP angle mass   SURGEON:  Surgeon(s) and Role:    Judith Part, MD - Primary    Duffy Rhody, MD - Assisting   ANESTHESIA: ETGA   BRIEF HISTORY: This is a 59 year old woman who presented with symptoms of hydrocephalus and left sided hearing loss. The patient was found to have a large left vestibular schwannoma with ventriculomegaly. This was discussed with the patient as well as risks, benefits, and alternatives and wished to proceed with surgery.   OPERATIVE DETAIL: The patient was taken to the operating room and placed on the OR table in the supine position. She had significantly restricted axial rotation in the cervical spine to the right, so the position was changed to lateral decubitus position to allow for the operative trajectory to be in the appropriate plane. A formal time out was performed with two patient identifiers and confirmed the operative site. Anesthesia was induced by the anesthesia team. The operative site was marked, hair was clipped with surgical clippers, the area was then prepped and draped in a sterile fashion.   The ventriculostomy was first placed. An incision was created 6cm superior and 3cm lateral to the inion. A burr hole was created, dura opened, and a ventricular catheter was passed until CSF returned. This was then tunneled and secured and the incision was closed. Of note, the opening pressure was moderate, but not remarkably high.   A linear incision was placed in the retrosigmoid region and a standard retrosigmoid craniectomy was performed. As expected from preop imaging, the patient had very large mastoid air cells that were entered and waxed. The dura was opened, CSF was drained, and the tumor  was immediately evident. After stimulating to confirm that it was not a facial nerve neuroma, the tumor was entered and debulked using the CUSA. The facial nerve was then located inferiorly at the brainstem and protected with a cottonoid. The tumor was further debulked and dissected off the brainstem as well as the 4th and 5th cranial nerves, SCA, and AICA. The nerve was then located at the porus and this portion of the tumor was debulked. The porus did not have to be drilled, as the intracanalicular portions appeared to come out well with a back-angled curette. The nerve was then stimulating again to confirm normal stimulation.    All instrument and sponge counts were correct, the incision was then closed in layers by repeating waxing of the air cells, closure of the dura, placement of duragen, then sealing with Adheris (Stryker) fibrin sealant. A large burr hole cover was placed over the defect and secured with titanium screws. The incision was then closed in layers. The patient was then returned to anesthesia for emergence. No apparent complications at the completion of the procedure.   EBL:  531mL   DRAINS: Left occipital EVD   SPECIMENS: Left CPA tumor   Judith Part, MD 01/27/20 8:00 PM

## 2020-01-27 NOTE — Brief Op Note (Signed)
01/27/2020  7:59 PM  PATIENT:  Cristina Conner  59 y.o. female  PRE-OPERATIVE DIAGNOSIS:  Vestibular Schannoma  POST-OPERATIVE DIAGNOSIS:  Vestibular Schannoma  PROCEDURE:  Procedure(s): Left Craniotomy for Tumor Resection (Left) VENTRICULOSTOMY (Left)  SURGEON:  Surgeon(s) and Role:    * Judith Part, MD - Primary    * Vallarie Mare, MD - Assisting  PHYSICIAN ASSISTANT:   ANESTHESIA:   general  EBL:  425 mL   BLOOD ADMINISTERED:none  DRAINS: Left posterior ventricular drain   LOCAL MEDICATIONS USED:  LIDOCAINE   SPECIMEN:  Source of Specimen:  Left CPA tumor  DISPOSITION OF SPECIMEN:  PATHOLOGY  COUNTS:  YES  TOURNIQUET:  * No tourniquets in log *  DICTATION: .Note written in EPIC  PLAN OF CARE: Admit to inpatient   PATIENT DISPOSITION:  PACU - hemodynamically stable.   Delay start of Pharmacological VTE agent (>24hrs) due to surgical blood loss or risk of bleeding: yes

## 2020-01-27 NOTE — Transfer of Care (Signed)
Immediate Anesthesia Transfer of Care Note  Patient: Cristina Conner  Procedure(s) Performed: Left Craniotomy for Tumor Resection (Left Head) VENTRICULOSTOMY (Left Head)  Patient Location: PACU  Anesthesia Type:General  Level of Consciousness: drowsy  Airway & Oxygen Therapy: Patient Spontanous Breathing and Patient connected to face mask oxygen  Post-op Assessment: Report given to RN and Post -op Vital signs reviewed and stable  Post vital signs: Reviewed and stable  Last Vitals:  Vitals Value Taken Time  BP 144/85 01/27/20 2020  Temp    Pulse 116 01/27/20 2023  Resp 9 01/27/20 2023  SpO2 100 % 01/27/20 2023  Vitals shown include unvalidated device data.  Last Pain:  Vitals:   01/27/20 0800  TempSrc: Oral  PainSc: 0-No pain      Patients Stated Pain Goal: 0 ([id number])  Complications: No apparent anesthesia complications

## 2020-01-27 NOTE — Progress Notes (Signed)
Neurosurgery Service Progress Note  Subjective: No acute events overnight, no new complaints  Objective: Vitals:   01/27/20 0326 01/27/20 0400 01/27/20 0500 01/27/20 0600  BP:  139/78 (!) 137/111   Pulse:    75  Resp:  10 11 10   Temp: 98.3 F (36.8 C)     TempSrc: Oral     SpO2:  100%  100%  Weight:      Height:       Temp (24hrs), Avg:98 F (36.7 C), Min:97.7 F (36.5 C), Max:98.4 F (36.9 C)  CBC Latest Ref Rng & Units 01/26/2020 01/24/2020 01/24/2020  WBC 4.0 - 10.5 K/uL 15.5(H) 6.0 4.1  Hemoglobin 12.0 - 15.0 g/dL 9.6(L) 10.3(L) 8.8(L)  Hematocrit 36.0 - 46.0 % 31.2(L) 33.8(L) 29.2(L)  Platelets 150 - 400 K/uL 419(H) 334 235   BMP Latest Ref Rng & Units 01/26/2020 01/24/2020 01/24/2020  Glucose 70 - 99 mg/dL 133(H) - 98  BUN 6 - 20 mg/dL 13 - 10  Creatinine 0.44 - 1.00 mg/dL 0.84 0.92 0.81  BUN/Creat Ratio 9 - 23 - - -  Sodium 135 - 145 mmol/L 140 - 141  Potassium 3.5 - 5.1 mmol/L 3.9 - 3.6  Chloride 98 - 111 mmol/L 106 - 107  CO2 22 - 32 mmol/L 24 - 26  Calcium 8.9 - 10.3 mg/dL 9.4 - 8.5(L)    Intake/Output Summary (Last 24 hours) at 01/27/2020 0753 Last data filed at 01/26/2020 1800 Gross per 24 hour  Intake 985.68 ml  Output --  Net 985.68 ml    Current Facility-Administered Medications:  .  0.9 %  sodium chloride infusion, 250 mL, Intravenous, PRN, Michael Boston, MD, Last Rate: 125 mL/hr at 01/25/20 1258, Rate Change at 01/25/20 1258 .  acetaminophen (TYLENOL) tablet 1,000 mg, 1,000 mg, Oral, Q6H, Ileana Roup, MD, 1,000 mg at 01/26/20 2102 .  alum & mag hydroxide-simeth (MAALOX/MYLANTA) 200-200-20 MG/5ML suspension 30 mL, 30 mL, Oral, Q6H PRN, Michael Boston, MD, 30 mL at 01/22/20 1947 .  bisacodyl (DULCOLAX) suppository 10 mg, 10 mg, Rectal, Q12H PRN, Michael Boston, MD, 10 mg at 01/22/20 1147 .  busPIRone (BUSPAR) tablet 5 mg, 5 mg, Oral, TID, Dwyane Dee, MD, 5 mg at 01/26/20 2103 .  Chlorhexidine Gluconate Cloth 2 % PADS 6 each, 6 each, Topical,  Daily, Kipp Brood, MD, 6 each at 01/26/20 1245 .  dicyclomine (BENTYL) capsule 10 mg, 10 mg, Oral, TID Rogelia Mire, MD, 10 mg at 01/26/20 1626 .  diphenhydrAMINE (BENADRYL) capsule 25 mg, 25 mg, Oral, Q6H PRN **OR** diphenhydrAMINE (BENADRYL) injection 25 mg, 25 mg, Intravenous, Q6H PRN, Ileana Roup, MD .  famotidine (PEPCID) tablet 40 mg, 40 mg, Oral, Daily, Michael Boston, MD, 40 mg at 01/26/20 1036 .  FLUoxetine (PROZAC) capsule 10 mg, 10 mg, Oral, Daily, Michael Boston, MD, 10 mg at 01/26/20 1035 .  folic acid (FOLVITE) tablet 1 mg, 1 mg, Oral, Daily, Michael Boston, MD, 1 mg at 01/26/20 1039 .  hydrALAZINE (APRESOLINE) tablet 25 mg, 25 mg, Oral, Q4H PRN, Dwyane Dee, MD, 25 mg at 01/26/20 1717 .  HYDROmorphone (DILAUDID) injection 0.5 mg, 0.5 mg, Intravenous, Q3H PRN, Ileana Roup, MD .  ibuprofen (ADVIL) tablet 600 mg, 600 mg, Oral, Q6H PRN, Ileana Roup, MD, 600 mg at 01/25/20 [id number] .  insulin aspart (novoLOG) injection 1-3 Units, 1-3 Units, Subcutaneous, Q4H, Kipp Brood, MD, Stopped at 01/27/20 0037 .  iron polysaccharides (NIFEREX) capsule 150 mg, 150 mg, Oral, Daily, Gross, Remo Lipps,  MD, 150 mg at 01/26/20 1037 .  magic mouthwash, 15 mL, Oral, QID PRN, Michael Boston, MD .  methocarbamol (ROBAXIN) 1,000 mg in dextrose 5 % 100 mL IVPB, 1,000 mg, Intravenous, Q6H PRN, Michael Boston, MD, Last Rate: 200 mL/hr at 01/23/20 1949, 1,000 mg at 01/23/20 1949 .  metoprolol tartrate (LOPRESSOR) tablet 25 mg, 25 mg, Oral, BID, Rai, Ripudeep K, MD, 25 mg at 01/26/20 2102 .  multivitamin with minerals tablet 1 tablet, 1 tablet, Oral, Daily, Dwyane Dee, MD, 1 tablet at 01/26/20 1038 .  ondansetron (ZOFRAN-ODT) disintegrating tablet 4 mg, 4 mg, Oral, Q6H PRN **OR** ondansetron (ZOFRAN) injection 4 mg, 4 mg, Intravenous, Q6H PRN, Ileana Roup, MD, 4 mg at 01/23/20 0936 .  polyethylene glycol (MIRALAX / GLYCOLAX) packet 17 g, 17 g, Oral, Q12H PRN, Michael Boston,  MD, 17 g at 01/25/20 1831 .  polyethylene glycol (MIRALAX / GLYCOLAX) packet 17 g, 17 g, Oral, Daily, Corey Harold, NP, 17 g at 01/26/20 1036 .  simethicone (MYLICON) 40 [id number] suspension 40 mg, 40 mg, Oral, QID PRN, Michael Boston, MD .  sodium chloride flush (NS) 0.9 % injection 3 mL, 3 mL, Intravenous, Once, White, Jakaria Mt, MD .  sodium chloride flush (NS) 0.9 % injection 3 mL, 3 mL, Intravenous, Q12H, Michael Boston, MD, 3 mL at 01/26/20 0818 .  sodium chloride flush (NS) 0.9 % injection 3 mL, 3 mL, Intravenous, PRN, Michael Boston, MD .  traMADol Veatrice Bourbon) tablet 50 mg, 50 mg, Oral, Q6H PRN, Ileana Roup, MD, 50 mg at 01/24/20 0818 .  vitamin B-12 (CYANOCOBALAMIN) tablet 1,000 mcg, 1,000 mcg, Oral, Daily, Dwyane Dee, MD, 1,000 mcg at 01/26/20 1037 .  zolpidem (AMBIEN) tablet 5 mg, 5 mg, Oral, QHS PRN,MR X 1, Gross, Remo Lipps, MD   Physical Exam: AOx3, PERRL, EOMI, +dec'd hearing on L, FS, Strength 5/5 x4, SILTx4  Assessment & Plan: 59 y.o. woman s/p small bowel resection w/ post-op ataxia. 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  -OR 5/27 for resection and EVD placement -4N ICU post-op -hold dex given her recent bowel anastamosis  Judith Part  01/27/20 7:53 AM

## 2020-01-27 NOTE — Anesthesia Preprocedure Evaluation (Signed)
Anesthesia Evaluation  Patient identified by MRN, date of birth, ID band Patient awake    Reviewed: Allergy & Precautions, NPO status , Patient's Chart, lab work & pertinent test results  History of Anesthesia Complications (+) PONV  Airway Mallampati: I  TM Distance: >3 FB Neck ROM: Full    Dental   Pulmonary    Pulmonary exam normal        Cardiovascular hypertension, Pt. on medications Normal cardiovascular exam     Neuro/Psych Anxiety Depression    GI/Hepatic GERD  Medicated and Controlled,  Endo/Other    Renal/GU      Musculoskeletal   Abdominal   Peds  Hematology   Anesthesia Other Findings   Reproductive/Obstetrics                             Anesthesia Physical Anesthesia Plan  ASA: III  Anesthesia Plan: General   Post-op Pain Management:    Induction: Intravenous  PONV Risk Score and Plan: 4 or greater and Ondansetron, Dexamethasone, Midazolam and Treatment may vary due to age or medical condition  Airway Management Planned: Oral ETT  Additional Equipment: Arterial line  Intra-op Plan:   Post-operative Plan: Extubation in OR  Informed Consent: I have reviewed the patients History and Physical, chart, labs and discussed the procedure including the risks, benefits and alternatives for the proposed anesthesia with the patient or authorized representative who has indicated his/her understanding and acceptance.       Plan Discussed with: CRNA and Surgeon  Anesthesia Plan Comments:         Anesthesia Quick Evaluation

## 2020-01-27 NOTE — Anesthesia Procedure Notes (Signed)
Procedure Name: Intubation Date/Time: 01/27/2020 2:16 PM Performed by: Lance Coon, CRNA Pre-anesthesia Checklist: Patient identified, Emergency Drugs available, Suction available, Patient being monitored and Timeout performed Patient Re-evaluated:Patient Re-evaluated prior to induction Oxygen Delivery Method: Circle system utilized Preoxygenation: Pre-oxygenation with 100% oxygen Induction Type: IV induction Ventilation: Mask ventilation without difficulty Laryngoscope Size: Miller and 3 Grade View: Grade I Tube type: Oral Tube size: 7.0 mm Number of attempts: 1 Airway Equipment and Method: Stylet Placement Confirmation: ETT inserted through vocal cords under direct vision,  breath sounds checked- equal and bilateral and positive ETCO2 Secured at: 21 cm Tube secured with: Tape Dental Injury: Teeth and Oropharynx as per pre-operative assessment

## 2020-01-28 ENCOUNTER — Inpatient Hospital Stay (HOSPITAL_COMMUNITY): Payer: BC Managed Care – PPO

## 2020-01-28 LAB — CBC
HCT: 24.9 % — ABNORMAL LOW (ref 36.0–46.0)
Hemoglobin: 7.5 g/dL — ABNORMAL LOW (ref 12.0–15.0)
MCH: 24.9 pg — ABNORMAL LOW (ref 26.0–34.0)
MCHC: 30.1 g/dL (ref 30.0–36.0)
MCV: 82.7 fL (ref 80.0–100.0)
Platelets: 322 10*3/uL (ref 150–400)
RBC: 3.01 MIL/uL — ABNORMAL LOW (ref 3.87–5.11)
RDW: 20.1 % — ABNORMAL HIGH (ref 11.5–15.5)
WBC: 9.3 10*3/uL (ref 4.0–10.5)
nRBC: 0 % (ref 0.0–0.2)

## 2020-01-28 LAB — MAGNESIUM: Magnesium: 1.9 mg/dL (ref 1.7–2.4)

## 2020-01-28 LAB — GLUCOSE, CAPILLARY
Glucose-Capillary: 120 mg/dL — ABNORMAL HIGH (ref 70–99)
Glucose-Capillary: 115 mg/dL — ABNORMAL HIGH (ref 70–99)
Glucose-Capillary: 130 mg/dL — ABNORMAL HIGH (ref 70–99)
Glucose-Capillary: 125 mg/dL — ABNORMAL HIGH (ref 70–99)
Glucose-Capillary: 131 mg/dL — ABNORMAL HIGH (ref 70–99)

## 2020-01-28 LAB — PHOSPHORUS: Phosphorus: 3.6 mg/dL (ref 2.5–4.6)

## 2020-01-28 MED ORDER — IBUPROFEN 200 MG PO TABS
600.0000 mg | ORAL_TABLET | Freq: Four times a day (QID) | ORAL | Status: DC | PRN
  Administered 2020-01-29 – 2020-01-31 (×4): 600 mg
  Filled 2020-01-28 (×4): qty 3

## 2020-01-28 MED ORDER — PRO-STAT SUGAR FREE PO LIQD
30.0000 mL | Freq: Two times a day (BID) | ORAL | Status: DC
Start: 2020-01-28 — End: 2020-01-28

## 2020-01-28 MED ORDER — HYDROCODONE-ACETAMINOPHEN 5-325 MG PO TABS
1.0000 | ORAL_TABLET | ORAL | Status: DC | PRN
  Administered 2020-01-29 – 2020-02-03 (×5): 1
  Filled 2020-01-28 (×5): qty 1

## 2020-01-28 MED ORDER — ADULT MULTIVITAMIN W/MINERALS CH
1.0000 | ORAL_TABLET | Freq: Every day | ORAL | Status: DC
  Administered 2020-01-29 – 2020-02-03 (×6): 1
  Filled 2020-01-28 (×6): qty 1

## 2020-01-28 MED ORDER — METOPROLOL TARTRATE 25 MG PO TABS
25.0000 mg | ORAL_TABLET | Freq: Two times a day (BID) | ORAL | Status: DC
  Administered 2020-01-28 – 2020-01-31 (×6): 25 mg
  Filled 2020-01-28 (×7): qty 1

## 2020-01-28 MED ORDER — ORAL CARE MOUTH RINSE
15.0000 mL | Freq: Two times a day (BID) | OROMUCOSAL | Status: DC
  Administered 2020-01-28 – 2020-02-03 (×11): 15 mL via OROMUCOSAL

## 2020-01-28 MED ORDER — ALUM & MAG HYDROXIDE-SIMETH 200-200-20 MG/5ML PO SUSP: 30.0000 mL | Freq: Four times a day (QID) | ORAL | Status: DC | PRN

## 2020-01-28 MED ORDER — BUSPIRONE HCL 10 MG PO TABS
5.0000 mg | ORAL_TABLET | Freq: Three times a day (TID) | ORAL | Status: DC
  Administered 2020-01-28 – 2020-01-31 (×10): 5 mg
  Filled 2020-01-28 (×10): qty 1

## 2020-01-28 MED ORDER — ACETAMINOPHEN 500 MG PO TABS
1000.0000 mg | ORAL_TABLET | Freq: Four times a day (QID) | ORAL | Status: DC
  Administered 2020-01-28 – 2020-01-31 (×9): 1000 mg
  Filled 2020-01-28 (×10): qty 2

## 2020-01-28 MED ORDER — ACETAMINOPHEN 325 MG PO TABS: 650.0000 mg | ORAL_TABLET | ORAL | Status: DC | PRN

## 2020-01-28 MED ORDER — VITAL AF 1.2 CAL PO LIQD
1000.0000 mL | ORAL | Status: DC
  Administered 2020-01-28 – 2020-01-29 (×2): 1000 mL

## 2020-01-28 MED ORDER — DOCUSATE SODIUM 50 MG/5ML PO LIQD
100.0000 mg | Freq: Two times a day (BID) | ORAL | Status: DC
  Administered 2020-01-28 – 2020-02-03 (×8): 100 mg
  Filled 2020-01-28 (×12): qty 10

## 2020-01-28 MED ORDER — ZOLPIDEM TARTRATE 5 MG PO TABS: 5.0000 mg | ORAL_TABLET | Freq: Every evening | ORAL | Status: DC | PRN

## 2020-01-28 MED ORDER — ONDANSETRON HCL 4 MG/2ML IJ SOLN
4.0000 mg | INTRAMUSCULAR | Status: DC | PRN
  Administered 2020-01-29 – 2020-02-02 (×5): 4 mg via INTRAVENOUS
  Filled 2020-01-28 (×5): qty 2

## 2020-01-28 MED ORDER — PROMETHAZINE HCL 25 MG PO TABS: 12.5000 mg | ORAL_TABLET | ORAL | Status: DC | PRN

## 2020-01-28 MED ORDER — CHLORHEXIDINE GLUCONATE 0.12 % MT SOLN
15.0000 mL | Freq: Two times a day (BID) | OROMUCOSAL | Status: DC
  Administered 2020-01-28 – 2020-02-03 (×9): 15 mL via OROMUCOSAL
  Filled 2020-01-28 (×6): qty 15

## 2020-01-28 MED ORDER — HYDRALAZINE HCL 25 MG PO TABS: 25.0000 mg | ORAL_TABLET | ORAL | Status: DC | PRN

## 2020-01-28 MED ORDER — FOLIC ACID 1 MG PO TABS
1.0000 mg | ORAL_TABLET | Freq: Every day | ORAL | Status: DC
  Administered 2020-01-29 – 2020-02-03 (×6): 1 mg
  Filled 2020-01-28 (×6): qty 1

## 2020-01-28 MED ORDER — DICYCLOMINE HCL 10 MG PO CAPS
10.0000 mg | ORAL_CAPSULE | Freq: Three times a day (TID) | ORAL | Status: DC
  Administered 2020-01-28 – 2020-02-03 (×18): 10 mg
  Filled 2020-01-28 (×20): qty 1

## 2020-01-28 MED ORDER — HYPROMELLOSE (GONIOSCOPIC) 2.5 % OP SOLN
1.0000 [drp] | OPHTHALMIC | Status: DC
  Administered 2020-01-28 – 2020-02-03 (×53): 1 [drp] via OPHTHALMIC
  Filled 2020-01-28: qty 15

## 2020-01-28 MED ORDER — ONDANSETRON HCL 4 MG PO TABS: 4.0000 mg | ORAL_TABLET | ORAL | Status: DC | PRN

## 2020-01-28 MED ORDER — POLYETHYLENE GLYCOL 3350 17 G PO PACK
17.0000 g | PACK | Freq: Every day | ORAL | Status: DC
  Administered 2020-01-29 – 2020-02-01 (×3): 17 g
  Filled 2020-01-28 (×6): qty 1

## 2020-01-28 MED ORDER — TRAMADOL HCL 50 MG PO TABS
50.0000 mg | ORAL_TABLET | Freq: Four times a day (QID) | ORAL | Status: DC | PRN
  Administered 2020-01-28 – 2020-01-29 (×2): 50 mg
  Filled 2020-01-28 (×2): qty 1

## 2020-01-28 MED ORDER — GADOBUTROL 1 MMOL/ML IV SOLN
5.0000 mL | Freq: Once | INTRAVENOUS | Status: AC | PRN
  Administered 2020-01-28: 5 mL via INTRAVENOUS

## 2020-01-28 MED ORDER — VITAMIN B-12 1000 MCG PO TABS
1000.0000 ug | ORAL_TABLET | Freq: Every day | ORAL | Status: DC
  Administered 2020-01-29 – 2020-02-03 (×6): 1000 ug
  Filled 2020-01-28 (×6): qty 1

## 2020-01-28 MED ORDER — POLYETHYLENE GLYCOL 3350 17 G PO PACK: 17.0000 g | PACK | Freq: Every day | ORAL | Status: DC | PRN

## 2020-01-28 MED ORDER — ACETAMINOPHEN 650 MG RE SUPP: 650.0000 mg | RECTAL | Status: DC | PRN

## 2020-01-28 MED ORDER — FLUOXETINE HCL 10 MG PO CAPS
10.0000 mg | ORAL_CAPSULE | Freq: Every day | ORAL | Status: DC
  Administered 2020-01-29 – 2020-02-03 (×6): 10 mg
  Filled 2020-01-28 (×6): qty 1

## 2020-01-28 MED ORDER — POLYSACCHARIDE IRON COMPLEX 150 MG PO CAPS
150.0000 mg | ORAL_CAPSULE | Freq: Every day | ORAL | Status: DC
  Administered 2020-01-29 – 2020-02-03 (×6): 150 mg
  Filled 2020-01-28 (×6): qty 1

## 2020-01-28 MED ORDER — SIMETHICONE 40 MG/0.6ML PO SUSP
40.0000 mg | Freq: Four times a day (QID) | ORAL | Status: DC | PRN
  Filled 2020-01-28: qty 0.6

## 2020-01-28 MED FILL — Thrombin For Soln 5000 Unit: CUTANEOUS | Qty: 5000 | Status: AC

## 2020-01-28 NOTE — Progress Notes (Signed)
Initial Nutrition Assessment  DOCUMENTATION CODES:   Severe malnutrition in context of chronic illness  INTERVENTION:   Initiate tube feeding via Cortrak tube: Vital AF 1.2 at 20 ml/h and increase by 10 ml every 8 hours to goal rate of 60 ml/hr (1440 ml per day)   Provides 1728 kcal, 108 gm protein, 1167 ml free water daily  Monitor magnesium and phosphorus every 12 hours x 4 occurances, MD to replete as needed, as pt is at risk for refeeding syndrome given severe malnutrition.   NUTRITION DIAGNOSIS:   Severe Malnutrition related to altered GI function as evidenced by energy intake < or equal to 75% for > or equal to 1 month, percent weight loss.  GOAL:   Patient will meet greater than or equal to 90% of their needs  MONITOR:   Diet advancement, TF tolerance, Labs  REASON FOR ASSESSMENT:   Consult Enteral/tube feeding initiation and management  ASSESSMENT:   Pt with PMH of several month hx of abd pain, nausea, and vomiting who was admitted 5/18 with SBO s/p SBR 5/19 who developed weakness prior to d/c. Pt found to have large vestibular schannoma and hydrocephalus.    Pt discussed during ICU rounds and with RN.  Pt with 18% weight loss in 3 months, usual weight 150 lb back in 10/2019. Per pt she was unable to tolerate PO during that time with nausea and vomiting.    5/18 admit with SBO 5/19 s/p SBR 5/24 tx Neuro ICU with hydrocephalus 5/27 s/p resection with EVD  5/28 failed swallow eval; cortrak placed  Medications reviewed  Labs reviewed    NUTRITION - FOCUSED PHYSICAL EXAM:    Most Recent Value  Orbital Region  Moderate depletion  Upper Arm Region  Moderate depletion  Thoracic and Lumbar Region  Mild depletion  Buccal Region  Unable to assess  Temple Region  Moderate depletion  Clavicle Bone Region  Moderate depletion  Clavicle and Acromion Bone Region  Moderate depletion  Scapular Bone Region  Unable to assess  Dorsal Hand  Mild depletion  Patellar  Region  Unable to assess  Anterior Thigh Region  Unable to assess  Posterior Calf Region  Unable to assess  Edema (RD Assessment)  None  Hair  Reviewed  Eyes  Reviewed  Mouth  Reviewed  Skin  Reviewed  Nails  Reviewed       Diet Order:   Diet Order            Diet NPO time specified  Diet effective midnight              EDUCATION NEEDS:   No education needs have been identified at this time  Skin:     Last BM:     Height:   Ht Readings from Last 1 Encounters:  01/27/20 5\' 6"  (1.676 m)    Weight:   Wt Readings from Last 1 Encounters:  01/27/20 55.8 kg    Ideal Body Weight:     BMI:  Body mass index is 19.85 kg/m.  Estimated Nutritional Needs:   Kcal:  1700-1900  Protein:  85-100 grams  Fluid:  > 1.7 L/day  Lockie Pares., RD, LDN, CNSC See AMiON for contact information

## 2020-01-28 NOTE — Progress Notes (Signed)
Physical Therapy Treatment and Re-evaluation Patient Details Name: Cristina Conner MRN: [id number] DOB: 05/09/1961 Today's Date: 01/28/2020    History of Present Illness 59 yo female s/p lap assisted SBR for retained capsule endoscope, Dr. Dema Severin, 5/19. 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 5/27 s/p L craniotomy for tumor resection ventriculostom. pt was experiencing hydrocephalus and L hearing loss. Pt underwent L vestibular schwannoma resection on 5/27 with EVD placement.    PT Comments    PT performs re-evaluation after pt underwent vestibular schwannoma resection on  5/27. Pt limited by nausea, vomiting and dizziness this session with mobility. Pt with slowed processing and generalized weakness as well as increased postural sway with sitting. Pt requires physical assistance for all functional mobility at this time to maintain safety. Pt with resting nystagmus and appears to have L superior rectus dysfunction as she is unable to gaze upward with this eye. Pt will benefit from continued PT POC to improve mobility quality and tolerance. Pt will benefit from high intensity inpatient PT services at the time of discharge to aide in a return to independence.  Follow Up Recommendations  CIR;Supervision/Assistance - 24 hour     Equipment Recommendations  Wheelchair (measurements PT);Wheelchair cushion (measurements PT);Hospital bed    Recommendations for Other Services       Precautions / Restrictions Precautions Precautions: Fall Precaution Comments: pt is a significant fall risk Restrictions Weight Bearing Restrictions: No    Mobility  Bed Mobility Overal bed mobility: Needs Assistance Bed Mobility: Sit to Supine;Supine to Sit     Supine to sit: Mod assist;HOB elevated Sit to supine: Max assist;+2 for physical assistance      Transfers                  General transfer comment: deferred 2/2 N/V  Ambulation/Gait                 Stairs             Wheelchair Mobility    Modified Rankin (Stroke Patients Only)       Balance Overall balance assessment: Needs assistance Sitting-balance support: Single extremity supported;Feet supported Sitting balance-Leahy Scale: Fair Sitting balance - Comments: minG-minA at edge of bed, increased sway in all directions Postural control: Right lateral lean;Left lateral lean                                  Cognition Arousal/Alertness: Awake/alert Behavior During Therapy: Flat affect Overall Cognitive Status: Impaired/Different from baseline Area of Impairment: Orientation;Attention;Following commands;Safety/judgement;Memory;Awareness;Problem solving                 Orientation Level: Disoriented to;Time(disoriented to day, knows month and year) Current Attention Level: Focused Memory: Decreased recall of precautions;Decreased short-term memory Following Commands: Follows one step commands with increased time Safety/Judgement: Decreased awareness of safety;Decreased awareness of deficits Awareness: Intellectual Problem Solving: Slow processing;Requires verbal cues General Comments: answers "no" to orientation questions that she does not know. pt reports year as 2020       Exercises      General Comments General comments (skin integrity, edema, etc.): pt tachy into low 120s during mobility. HR in 100s-110s at rest. Pt limited by N/V. Pt with some resting nystagmus, L eye unable to look upward at this time.  Pt reporting diplopia and dizziness with mobility      Pertinent Vitals/Pain Pain Assessment: Faces Pain Score: 3  Faces Pain Scale: Hurts whole lot Pain Location: head Pain Descriptors / Indicators: Aching Pain Intervention(s): Monitored during session    Home Living Family/patient expects to be discharged to:: Private residence Living  Arrangements: Alone Available Help at Discharge: Family Type of Home: House Home Access: Stairs to enter   Home Layout: One level Home Equipment: Environmental consultant - 2 wheels;Bedside commode Additional Comments: daughter will help when she goes home. Daughter is speech therapist. Pt likes to [place]    Prior Function Level of Independence: Independent      Comments: dtr states she is independent   PT Goals (current goals can now be found in the care plan section) Acute Rehab PT Goals Patient Stated Goal: feel better, less pain PT Goal Formulation: With patient/family Time For Goal Achievement: 02/11/20 Potential to Achieve Goals: Good Progress towards PT goals: Goals downgraded-see care plan    Frequency    Min 4X/week      PT Plan Current plan remains appropriate    Co-evaluation   Reason for Co-Treatment: For patient/therapist safety;To address functional/ADL transfers   OT goals addressed during session: ADL's and self-care;Proper use of Adaptive equipment and DME;Strengthening/ROM      AM-PAC PT "6 Clicks" Mobility   Outcome Measure  Help needed turning from your back to your side while in a flat bed without using bedrails?: A Lot Help needed moving from lying on your back to sitting on the side of a flat bed without using bedrails?: A Lot Help needed moving to and from a bed to a chair (including a wheelchair)?: Total Help needed standing up from a chair using your arms (e.g., wheelchair or bedside chair)?: Total Help needed to walk in hospital room?: Total Help needed climbing 3-5 steps with a railing? : Total 6 Click Score: 8    End of Session   Activity Tolerance: Treatment limited secondary to medical complications (Comment);Patient limited by pain(nausea and vomiting) Patient left: in bed;with call bell/phone within reach;with bed alarm set;with family/visitor present Nurse Communication: Mobility status PT Visit Diagnosis: Difficulty in walking, not elsewhere  classified (R26.2);Other abnormalities of gait and mobility (R26.89);Unsteadiness on feet (R26.81)     Time: [id number] PT Time Calculation (min) (ACUTE ONLY): 23 min  Charges:                        Zenaida Niece, PT, DPT Acute Rehabilitation Pager: [phone]    Zenaida Niece 01/28/2020, 12:08 PM

## 2020-01-28 NOTE — Progress Notes (Signed)
SLP Cancellation Note  Patient Details Name: Cristina Conner MRN: [id number] DOB: 02-16-1961   Cancelled treatment:       Reason Eval/Treat Not Completed: Patient at procedure or test/unavailable. Just left floor for MRI. Discussed pt with RN and will plan to f/u this afternoon.     Osie Bond., M.A. [place] [facility] Pager [phone] Office [phone]  01/28/2020, 10:34 AM

## 2020-01-28 NOTE — Progress Notes (Signed)
Neurosurgery Service Progress Note  Subjective: No acute events overnight, no new complaints this morning, appears pretty fatigued, answers questions when prompted but otherwise just yes/no   Objective: Vitals:   01/28/20 0600 01/28/20 0630 01/28/20 0700 01/28/20 0800  BP: (!) 145/67 (!) 147/79 (!) 147/78 (!) 145/81  Pulse: (!) 105 (!) 112 (!) 110 (!) 112  Resp: 12 12 16 15   Temp:    99.5 F (37.5 C)  TempSrc:    Axillary  SpO2: 98% 98% 98% 98%  Weight:      Height:       Temp (24hrs), Avg:98.5 F (36.9 C), Min:97.9 F (36.6 C), Max:99.5 F (37.5 C)  CBC Latest Ref Rng & Units 01/27/2020 01/26/2020 01/24/2020  WBC 4.0 - 10.5 K/uL 9.3 15.5(H) 6.0  Hemoglobin 12.0 - 15.0 g/dL 7.5(L) 9.6(L) 10.3(L)  Hematocrit 36.0 - 46.0 % 24.9(L) 31.2(L) 33.8(L)  Platelets 150 - 400 K/uL 322 419(H) 334   BMP Latest Ref Rng & Units 01/27/2020 01/26/2020 01/24/2020  Glucose 70 - 99 mg/dL - 133(H) -  BUN 6 - 20 mg/dL - 13 -  Creatinine 0.44 - 1.00 mg/dL 0.78 0.84 0.92  BUN/Creat Ratio 9 - 23 - - -  Sodium 135 - 145 mmol/L - 140 -  Potassium 3.5 - 5.1 mmol/L - 3.9 -  Chloride 98 - 111 mmol/L - 106 -  CO2 22 - 32 mmol/L - 24 -  Calcium 8.9 - 10.3 mg/dL - 9.4 -    Intake/Output Summary (Last 24 hours) at 01/28/2020 0854 Last data filed at 01/28/2020 0800 Gross per 24 hour  Intake 2973.01 ml  Output 3331 ml  Net -357.99 ml    Current Facility-Administered Medications:  .  0.9 %  sodium chloride infusion, 250 mL, Intravenous, PRN, Michael Boston, MD, Last Rate: 10 mL/hr at 01/27/20 2355, 250 mL at 01/27/20 2355 .  acetaminophen (TYLENOL) tablet 650 mg, 650 mg, Oral, Q4H PRN **OR** acetaminophen (TYLENOL) suppository 650 mg, 650 mg, Rectal, Q4H PRN, Judith Part, MD .  acetaminophen (TYLENOL) tablet 1,000 mg, 1,000 mg, Oral, Q6H, Ileana Roup, MD, 1,000 mg at 01/26/20 2102 .  alum & mag hydroxide-simeth (MAALOX/MYLANTA) 200-200-20 MG/5ML suspension 30 mL, 30 mL, Oral, Q6H PRN, Michael Boston, MD, 30 mL at 01/22/20 1947 .  bisacodyl (DULCOLAX) suppository 10 mg, 10 mg, Rectal, Q12H PRN, Michael Boston, MD, 10 mg at 01/22/20 1147 .  busPIRone (BUSPAR) tablet 5 mg, 5 mg, Oral, TID, Dwyane Dee, MD, 5 mg at 01/27/20 0849 .  ceFAZolin (ANCEF) IVPB 2g/100 mL premix, 2 g, Intravenous, Q8H, Matai Carpenito, Joyice Faster, MD, Stopped at 01/28/20 0207 .  Chlorhexidine Gluconate Cloth 2 % PADS 6 each, 6 each, Topical, Daily, Kipp Brood, MD, 6 each at 01/26/20 1245 .  dicyclomine (BENTYL) capsule 10 mg, 10 mg, Oral, TID Rogelia Mire, MD, Stopped at 01/28/20 [phone] .  diphenhydrAMINE (BENADRYL) capsule 25 mg, 25 mg, Oral, Q6H PRN **OR** diphenhydrAMINE (BENADRYL) injection 25 mg, 25 mg, Intravenous, Q6H PRN, Ileana Roup, MD .  docusate sodium (COLACE) capsule 100 mg, 100 mg, Oral, BID, Elesha Thedford A, MD .  famotidine (PEPCID) IVPB 20 mg premix, 20 mg, Intravenous, Q12H, Judith Part, MD, Stopped at 01/28/20 [id number] .  FLUoxetine (PROZAC) capsule 10 mg, 10 mg, Oral, Daily, Michael Boston, MD, 10 mg at 01/27/20 0849 .  folic acid (FOLVITE) tablet 1 mg, 1 mg, Oral, Daily, Michael Boston, MD, 1 mg at 01/26/20 1039 .  [START ON 01/29/2020]  heparin injection 5,000 Units, 5,000 Units, Subcutaneous, Q8H, Elias Dennington A, MD .  hydrALAZINE (APRESOLINE) tablet 25 mg, 25 mg, Oral, Q4H PRN, Dwyane Dee, MD, 25 mg at 01/26/20 1717 .  HYDROcodone-acetaminophen (NORCO/VICODIN) 5-325 MG per tablet 1 tablet, 1 tablet, Oral, Q4H PRN, Judith Part, MD .  HYDROmorphone (DILAUDID) injection 0.5 mg, 0.5 mg, Intravenous, Q3H PRN, Judith Part, MD, 0.5 mg at 01/28/20 0819 .  ibuprofen (ADVIL) tablet 600 mg, 600 mg, Oral, Q6H PRN, Ileana Roup, MD, 600 mg at 01/25/20 [id number] .  insulin aspart (novoLOG) injection 1-3 Units, 1-3 Units, Subcutaneous, Q4H, Agarwala, Ravi, MD, 1 Units at 01/27/20 2357 .  iron polysaccharides (NIFEREX) capsule 150 mg, 150 mg, Oral, Daily, Michael Boston, MD, 150 mg at 01/26/20 1037 .  labetalol (NORMODYNE) injection 10-40 mg, 10-40 mg, Intravenous, Q10 min PRN, Judith Part, MD .  magic mouthwash, 15 mL, Oral, QID PRN, Michael Boston, MD .  methocarbamol (ROBAXIN) 1,000 mg in dextrose 5 % 100 mL IVPB, 1,000 mg, Intravenous, Q6H PRN, Michael Boston, MD, Last Rate: 200 mL/hr at 01/23/20 1949, 1,000 mg at 01/23/20 1949 .  metoprolol tartrate (LOPRESSOR) tablet 25 mg, 25 mg, Oral, BID, Rai, Ripudeep K, MD, 25 mg at 01/27/20 0849 .  multivitamin with minerals tablet 1 tablet, 1 tablet, Oral, Daily, Dwyane Dee, MD, 1 tablet at 01/26/20 1038 .  ondansetron (ZOFRAN) tablet 4 mg, 4 mg, Oral, Q4H PRN **OR** ondansetron (ZOFRAN) injection 4 mg, 4 mg, Intravenous, Q4H PRN, Judith Part, MD, 4 mg at 01/28/20 0131 .  polyethylene glycol (MIRALAX / GLYCOLAX) packet 17 g, 17 g, Oral, Daily, Corey Harold, NP, 17 g at 01/27/20 0848 .  polyethylene glycol (MIRALAX / GLYCOLAX) packet 17 g, 17 g, Oral, Daily PRN, Wally Shevchenko, Joyice Faster, MD .  promethazine (PHENERGAN) tablet 12.5-25 mg, 12.5-25 mg, Oral, Q4H PRN, Judith Part, MD .  simethicone (MYLICON) 40 [id number] suspension 40 mg, 40 mg, Oral, QID PRN, Michael Boston, MD .  sodium chloride flush (NS) 0.9 % injection 3 mL, 3 mL, Intravenous, Once, White, Ladon Mt, MD .  sodium chloride flush (NS) 0.9 % injection 3 mL, 3 mL, Intravenous, Q12H, Michael Boston, MD, 3 mL at 01/27/20 2357 .  sodium chloride flush (NS) 0.9 % injection 3 mL, 3 mL, Intravenous, PRN, Michael Boston, MD .  traMADol Veatrice Bourbon) tablet 50 mg, 50 mg, Oral, Q6H PRN, Ileana Roup, MD, 50 mg at 01/24/20 0818 .  vitamin B-12 (CYANOCOBALAMIN) tablet 1,000 mcg, 1,000 mcg, Oral, Daily, Dwyane Dee, MD, 1,000 mcg at 01/26/20 1037 .  zolpidem (AMBIEN) tablet 5 mg, 5 mg, Oral, QHS PRN,MR X 1, Gross, Remo Lipps, MD   Physical Exam: AOx3, decreased voluntary verbal output but normal content when prompted, PERRL, +mildly  dysconjugate gaze with partial 4th OS, +L facial palsy with HB 5, +intact facial sensation in V2-3, dec'd on L in V1, Strength 5/5 x4, SILTx4 EVD in place w/ clear output  Assessment & Plan: 59 y.o. woman w/ large VS and hydrocephalus s/p resection and intra-op EVD placement, recovering well.  -MRI w/wo today -keep EVD in x3d for wound protection - she has impressively large mastoid air cells, will clamp on 5/31 and see if she tolerates it, continue at +10 -SLP eval -eye gtt OS given incomplete eye closure, tape shut at night to avoid exposure keratophy -SCDs/TEDs, start [place]  01/28/20 8:54 AM

## 2020-01-28 NOTE — Progress Notes (Signed)
Rehab Admissions Coordinator Note:  Per PT recommendation, this patient was screened by Raechel Ache for appropriateness for an Inpatient Acute Rehab Consult. Noted pt's activity tolerance on eval was limited by nausea and vomiting. At this time, we will follow for greater tolerance with therapies prior to requesting consult order.   Raechel Ache 01/28/2020, 1:04 PM  I can be reached at [phone].

## 2020-01-28 NOTE — Evaluation (Signed)
Clinical/Bedside Swallow Evaluation Patient Details  Name: Cristina Conner MRN: [id number] Date of Birth: 07-Dec-1960  Today's Date: 01/28/2020 Time: SLP Start Time (ACUTE ONLY): [id number] SLP Stop Time (ACUTE ONLY): 1455 SLP Time Calculation (min) (ACUTE ONLY): 10 min  Past Medical History:  Past Medical History:  Diagnosis Date  . Anxiety   . Arthritis   . Diverticulosis   . GERD (gastroesophageal reflux disease)   . Hypertension   . Osteopenia   . Post-operative nausea and vomiting    vomiting after general anesthesia per pt.  . Pre-diabetes    Past Surgical History:  Past Surgical History:  Procedure Laterality Date  . CESAREAN SECTION     x 2  . COLONOSCOPY    . ENDOMETRIAL ABLATION    . ESOPHAGOGASTRODUODENOSCOPY  10/2019  . LAPAROSCOPY N/A 01/19/2020   Procedure: LAPAROSCOPY DIAGNOSTIC LAPAROTOMY WITH SMALL BOWEL RESECTION;  Surgeon: Ileana Roup, MD;  Location: [facility];  Service: General;  Laterality: N/A;  . PELVIC LAPAROSCOPY  1999   left salpingectomy, excision of Right, paratubal cyst  . PELVIC LAPAROSCOPY     laparoscopy with lysis of pelvic adhesions  . SHOULDER SURGERY  11/2010   "FROZEN SHOULDER"  . TOTAL HIP ARTHROPLASTY Left 08/17/2019   Procedure: LEFT TOTAL HIP ARTHROPLASTY ANTERIOR APPROACH;  Surgeon: Melrose Nakayama, MD;  Location: [facility];  Service: Orthopedics;  Laterality: Left;  . TUBAL LIGATION    . UPPER GASTROINTESTINAL ENDOSCOPY     HPI:  Pt is a 59 yo female s/p lap assisted SBR for retained capsule endoscope, Dr. Dema Severin, 5/19. 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 Pt was experiencing hydrocephalus and L hearing loss. 5/27 s/p L craniotomy for tumor resection ventriculostomy. PMH: HTN, L DA THA 2020, GERD, anxiety   Assessment / Plan / Recommendation Clinical Impression  Pt has significant left-sided weakness involving the lips, face, and  tongue. She also has reduced mandibular opening, and this makes it challenging to fully visualize her velum. Sensation seems relatively intact except for above her left eye (CN V, V1). Even with placement of a straw on her stronger right side, pt is not able to generate enough power to suck through the straw to get any liquid. A small amount was siphoned into her mouth with oral phase seemingly prolonged and followed by delayed throat clearing/cough. Multiple swallows are noted and pt very quickly becomes nauseated, at which time further trials were held. Given her nausea, generalized deconditioning, and significant left-sided impairments, would allow more time before starting PO diet. She may benefit from [facility] to better evaluate oropharyngeal function as she is able to take in more diagnostic trials.   SLP Visit Diagnosis: Dysphagia, oropharyngeal phase (R13.12)    Aspiration Risk  Moderate aspiration risk    Diet Recommendation NPO;Alternative means - temporary   Medication Administration: Via alternative means    Other  Recommendations Oral Care Recommendations: Oral care QID Other Recommendations: Have oral suction available   Follow up Recommendations Inpatient Rehab      Frequency and Duration min 2x/week  2 weeks       Prognosis Prognosis for Safe Diet Advancement: Good      Swallow Study   General HPI: Pt is a 59 yo female s/p lap assisted SBR for retained capsule endoscope, Dr. Dema Severin, 5/19. 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 Pt was experiencing hydrocephalus and L hearing loss. 5/27 s/p L craniotomy for tumor resection ventriculostomy. PMH: HTN, L DA THA 2020, GERD, anxiety Type of Study: Bedside Swallow Evaluation Previous Swallow Assessment: none in chart Diet Prior to this Study: NPO Temperature Spikes Noted: No Respiratory Status: Room air History of Recent  Intubation: (for procedure on 5/27) Behavior/Cognition: Cooperative;Requires cueing;Lethargic/Drowsy Oral Cavity Assessment: Within Functional Limits Oral Care Completed by SLP: Yes Oral Cavity - Dentition: Adequate natural dentition Self-Feeding Abilities: Needs assist Patient Positioning: Upright in bed Baseline Vocal Quality: Low vocal intensity Volitional Swallow: Able to elicit    Oral/Motor/Sensory Function Overall Oral Motor/Sensory Function: Severe impairment Facial ROM: Reduced left;Suspected CN VII (facial) dysfunction Facial Symmetry: Abnormal symmetry left;Suspected CN VII (facial) dysfunction Facial Strength: Reduced left;Suspected CN VII (facial) dysfunction Facial Sensation: Suspected CN V (Trigeminal) dysfunction;Reduced left(impaired sensation on L above eye (V1)) Lingual ROM: Reduced right;Suspected CN XII (hypoglossal) dysfunction Lingual Symmetry: Suspected CN XII (hypoglossal) dysfunction;Abnormal symmetry left(deviate to L) Lingual Strength: Reduced Velum: (difficult to see but suspect reduced elevation)   Ice Chips Ice chips: Impaired Presentation: Spoon Pharyngeal Phase Impairments: Multiple swallows   Thin Liquid Thin Liquid: Impaired Presentation: Straw Oral Phase Impairments: Reduced labial seal Oral Phase Functional Implications: Other (comment)(not able to suck through straw) Pharyngeal  Phase Impairments: Multiple swallows;Throat Clearing - Delayed;Cough - Delayed    Nectar Thick Nectar Thick Liquid: Not tested   Honey Thick Honey Thick Liquid: Not tested   Puree Puree: Not tested   Solid     Solid: Not tested       Osie Bond., M.A. [place] [facility] Pager [phone] Office [phone]  01/28/2020,3:19 PM

## 2020-01-28 NOTE — Anesthesia Postprocedure Evaluation (Signed)
Anesthesia Post Note  Patient: Debria [place]  Procedure(s) Performed: Left Craniotomy for Tumor Resection (Left Head) VENTRICULOSTOMY (Left Head)     Patient location during evaluation: PACU Anesthesia Type: General Level of consciousness: awake and alert Pain management: pain level controlled Vital Signs Assessment: post-procedure vital signs reviewed and stable Respiratory status: spontaneous breathing, nonlabored ventilation, respiratory function stable and patient connected to nasal cannula oxygen Cardiovascular status: blood pressure returned to baseline and stable Postop Assessment: no apparent nausea or vomiting Anesthetic complications: no    Last Vitals:  Vitals:   01/28/20 0600 01/28/20 0630  BP: (!) 145/67 (!) 147/79  Pulse: (!) 105 (!) 112  Resp: 12 12  Temp:    SpO2: 98% 98%    Last Pain:  Vitals:   01/28/20 0400  TempSrc: Axillary  PainSc: 0-No pain                 Devin Foskey DAVID

## 2020-01-28 NOTE — Evaluation (Signed)
Speech Language Pathology Evaluation Patient Details Name: Cristina Conner MRN: [id number] DOB: [birth year] Today's Date: 01/28/2020 Time: [id number] SLP Time Calculation (min) (ACUTE ONLY): 14 min  Problem List:  Patient Active Problem List   Diagnosis Date Noted  . Brain tumor ([place]) 01/27/2020  . Low folate 01/23/2020  . Anemia of chronic disease 01/23/2020  . Small bowel obstruction from retained endoscopy capsule at stricture s/p ileal resection 01/19/2020 01/18/2020  . 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 Primary localized osteoarthritis of left hip 08/18/19  . Death of family member-  husband passed Jul 2020 04/29/2019  . 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 Sleep difficulties 09/09/2018  . Pre-diabetes 03/09/2018  . Postmenopausal state- went thru early 40's 01/02/2018  . Primary osteoarthritis of left hip 01/02/2018  . Caregiver burden- for husband with ALLeukemia 01/02/2018  . Left hip pain 01/02/2018  . Osteopenia 07/31/2016  . Hypertension 05/20/2011  . Menopausal state 05/20/2011   Past Medical History:  Past Medical History:  Diagnosis Date  . Anxiety   . Arthritis   . Diverticulosis   . GERD (gastroesophageal reflux disease)   . Hypertension   . Osteopenia   . Post-operative nausea and vomiting    vomiting after general anesthesia per pt.  . Pre-diabetes    Past Surgical History:  Past Surgical History:  Procedure Laterality Date  . CESAREAN SECTION     x 2  . COLONOSCOPY    . ENDOMETRIAL ABLATION    . ESOPHAGOGASTRODUODENOSCOPY  10/2019  . LAPAROSCOPY N/A 01/19/2020   Procedure: LAPAROSCOPY DIAGNOSTIC LAPAROTOMY WITH SMALL BOWEL RESECTION;  Surgeon: Ileana Roup, MD;  Location: [facility];  Service: General;  Laterality: N/A;  . PELVIC LAPAROSCOPY  1999   left salpingectomy, excision of Right, paratubal cyst  . PELVIC LAPAROSCOPY     laparoscopy with lysis of pelvic adhesions  . SHOULDER SURGERY   11/2010   "FROZEN SHOULDER"  . TOTAL HIP ARTHROPLASTY Left 08/18/19   Procedure: LEFT TOTAL HIP ARTHROPLASTY ANTERIOR APPROACH;  Surgeon: Melrose Nakayama, MD;  Location: [facility];  Service: Orthopedics;  Laterality: Left;  . TUBAL LIGATION    . UPPER GASTROINTESTINAL ENDOSCOPY     HPI:  Pt is a 59 yo female s/p lap assisted SBR for retained capsule endoscope, Dr. Dema Severin, 5/19. 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 Pt was experiencing hydrocephalus and L hearing loss. 5/27 s/p L craniotomy for tumor resection ventriculostomy. PMH: HTN, L DA THA 2020, GERD, anxiety   Assessment / Plan / Recommendation Clinical Impression  Pt has dysarthric speech, characterized by imprecise articulation from facial weakness as well as reduced volume for phonation. Her verbal output is fairly limited and primarily at the word to short-phrase level, but linguistically it is appropriate. She is disoriented to time, drowsy, and requiring additional time to process one-step commands. Min cues were provided for sustained attention. She recalled 2/4 words on delayed recall task, not able to get the remaining two words despite multiple choice cues. She will benefit from SLP f/u to address these cognitive and communicative skills to maximize safety, with additional differential diagnosis of higher level cognitive abilities as she becomes more alert.      SLP Assessment  SLP Recommendation/Assessment: Patient needs continued Speech Lanaguage Pathology Services SLP Visit Diagnosis: Dysarthria and anarthria (R47.1);Cognitive communication deficit (R41.841)    Follow  Up Recommendations  Inpatient Rehab    Frequency and Duration min 2x/week  2 weeks      SLP Evaluation Cognition  Overall Cognitive Status: Impaired/Different from baseline Arousal/Alertness: (drowsy) Orientation Level: Oriented to person;Oriented to  place;Oriented to situation;Disoriented to time Attention: Sustained Sustained Attention: Impaired Sustained Attention Impairment: Functional basic Memory: Impaired Memory Impairment: Decreased recall of new information;Retrieval deficit;Storage deficit Problem Solving: Impaired Problem Solving Impairment: Functional basic Comments: slow processnig       Comprehension  Auditory Comprehension Overall Auditory Comprehension: Impaired Commands: (follows one-step commands with extra time) Conversation: Simple    Expression Expression Primary Mode of Expression: Verbal Verbal Expression Overall Verbal Expression: Other (comment)(limited output, but appropriate in what she says)   Oral / Motor  Oral Motor/Sensory Function Overall Oral Motor/Sensory Function: Severe impairment Facial ROM: Reduced left;Suspected CN VII (facial) dysfunction Facial Symmetry: Abnormal symmetry left;Suspected CN VII (facial) dysfunction Facial Strength: Reduced left;Suspected CN VII (facial) dysfunction Facial Sensation: Suspected CN V (Trigeminal) dysfunction;Reduced left Lingual ROM: Reduced right;Suspected CN XII (hypoglossal) dysfunction Lingual Symmetry: Suspected CN XII (hypoglossal) dysfunction;Abnormal symmetry left Lingual Strength: Reduced Velum: (difficult to see but suspect reduced elevation) Mandible: Impaired Motor Speech Overall Motor Speech: Impaired Respiration: Within functional limits Phonation: Low vocal intensity Articulation: Impaired Level of Impairment: Word Intelligibility: Intelligibility reduced Word: 75-100% accurate Phrase: 50-74% accurate Effective Techniques: Increased vocal intensity   GO                     Osie Bond., M.A. [place] [facility] Pager [phone] Office [phone]  01/28/2020, 3:38 PM

## 2020-01-28 NOTE — Procedures (Signed)
Cortrak  Person Inserting Tube:  Oswin Griffith, Creola Corn, RD Tube Type:  Cortrak - 43 inches Tube Location:  Left nare Initial Placement:  Stomach Secured by: Bridle Technique Used to Measure Tube Placement:  Documented cm marking at nare/ corner of mouth Cortrak Secured At:  61 cm   Cortrak Tube Team Note:  Consult received to place a Cortrak feeding tube.   No x-ray is required. RN may begin using tube.   If the tube becomes dislodged please keep the tube and contact the Cortrak team at [email or web address] (password [email or web address]) for replacement.  If after hours and replacement cannot be delayed, place a NG tube and confirm placement with an abdominal x-ray.    Larkin Ina, MS, RD, LDN RD pager number and weekend/on-call pager number located in [place].

## 2020-01-28 NOTE — Progress Notes (Signed)
EVAL OT     01/28/20 0900  OT Visit Information  Last OT Received On 01/28/20  PT/OT/SLP Co-Evaluation/Treatment Yes  Reason for Co-Treatment For patient/therapist safety;To address functional/ADL transfers  OT goals addressed during session ADL's and self-care;Proper use of Adaptive equipment and DME;Strengthening/ROM  History of Present Illness 59 yo female s/p lap assisted SBR for retained capsule endoscope, Dr. Dema Severin, 5/19. 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 5/27 s/p L craniotomy for tumor resection ventriculostom. pt was experiencing hydrocephalus and L hearing loss   Precautions  Precautions Fall  Restrictions  Weight Bearing Restrictions No  Home Living  Family/patient expects to be discharged to: Private residence  Living Arrangements Alone  Available Help at Discharge Family  Type of [place] to enter  Entrance Stairs-Number of Steps 2  [place] [facility] One level  Bathroom Shower/Tub Walk-in Patent examiner - 2 wheels;BSC  Additional Comments daughter will help when she goes home. Daughter is speech therapist. Pt likes to [place]  Prior Function  Level of Independence Independent  Comments dtr states she is independent  Communication  Communication No difficulties  Pain Assessment  Pain Assessment 0-10  Pain Score 3  Pain Location back of head  Pain Descriptors / Indicators Discomfort;Sore  Pain Intervention(s) Monitored during session;Repositioned  Cognition  Arousal/Alertness Awake/alert  Behavior During Therapy WFL for tasks assessed/performed  Overall Cognitive Status Impaired/Different from baseline  Area of Impairment Orientation  Orientation Level Time  Current Attention Level Focused  Memory Decreased recall of precautions;Decreased short-term memory  Following Commands  Follows one step commands inconsistently;Follows one step commands with increased time  Awareness Intellectual  Problem Solving Slow processing;Difficulty sequencing  General Comments answers "no" to orientation questions that she does not know. pt reports year as 2020   Upper Extremity Assessment  Upper Extremity Assessment Generalized weakness;RUE deficits/detail;LUE deficits/detail  RUE Deficits / Details reports numbness and tingling but later states its the same as the Left  LUE Deficits / Details reports noraml  Lower Extremity Assessment  Lower Extremity Assessment Defer to PT evaluation  Cervical / Trunk Assessment  Cervical / Trunk Assessment Normal  ADL  Overall ADL's  Needs assistance/impaired  Grooming Wash/dry face;Moderate assistance;Bed level  Upper Body Bathing Maximal assistance  Lower Body Bathing Total assistance  Upper Body Dressing  Maximal assistance  Lower Body Dressing Total assistance  Toilet Transfer Details (indicate cue type and reason) unable to attempt this session due to nausea  General ADL Comments pt progresed to EOB and required return to supine due to nauseated and RN giving medication  Vision- History  Baseline Vision/History Wears glasses  Wears Glasses At all times  Vision- Assessment  Vision Assessment? Yes  Eye Alignment Impaired (comment)  Ocular Range of Motion Restricted on the left;Restricted looking up (pt unable to look upward L )  Additional Comments pt with R eye occluded but can open eye lid. pt noted to have resting nystagmus.   Perception  Perception Tested? Yes  Perception Deficits Inattention/neglect  Inattention/Neglect Impaired- to be further tested in functional context  Bed Mobility  Overal bed mobility Needs Assistance  Bed Mobility Supine to Sit;Sit to Supine  Supine to sit Mod assist  Sit to supine Max assist  General bed mobility comments pt requires (A) to elevate trunk from  surface  Transfers  General transfer  comment deferred due to symptoms  Balance  Overall balance assessment Needs assistance  Sitting-balance support Single extremity supported;Feet supported  Sitting balance-Leahy Scale Poor  Sitting balance - Comments requires (A) to sustain. able to hold for short periods  General Comments  General comments (skin integrity, edema, etc.) HR 131 nauseated. Cool wrap to head and ice to neck. Daughter present throughout session EVD clamped by RN and Rn at bedside entire session  OT - End of Session  Activity Tolerance Other (comment) (limited by nausea)  Patient left in bed;with call bell/phone within reach;with bed alarm set;with family/visitor present;with nursing/sitter in room  Nurse Communication Mobility status;Precautions  OT Assessment  OT Recommendation/Assessment Patient needs continued OT Services  OT Visit Diagnosis Unsteadiness on feet (R26.81);Muscle weakness (generalized) (M62.81);Pain  OT Problem List Decreased strength;Decreased activity tolerance;Impaired balance (sitting and/or standing);Impaired vision/perception;Decreased cognition;Decreased safety awareness;Decreased knowledge of use of DME or AE;Pain  Barriers to Discharge Decreased caregiver support  Barriers to Discharge Comments daughter reports planning to move back from [place] to help mother  OT Plan  OT Frequency (ACUTE ONLY) Min 2X/week  OT Treatment/Interventions (ACUTE ONLY) Self-care/ADL training;Therapeutic exercise;Neuromuscular education;DME and/or AE instruction;Therapeutic activities;Cognitive remediation/compensation;Visual/perceptual remediation/compensation;Patient/family education;Balance training  AM-PAC OT "6 Clicks" Daily Activity Outcome Measure (Version 2)  Help from another person eating meals? 2  Help from another person taking care of personal grooming? 2  Help from another person toileting, which includes using toliet, bedpan, or urinal? 2  Help from another person bathing (including washing,  rinsing, drying)? 2  Help from another person to put on and taking off regular upper body clothing? 2  Help from another person to put on and taking off regular lower body clothing? 2  6 Click Score 12  OT Recommendation  Recommendations for Other Services Rehab consult  Follow Up Recommendations CIR  OT Equipment Other (comment) (defer to next session)  Individuals Consulted  Consulted and Agree with Results and Recommendations Patient unable/family or caregiver not available;Family member/caregiver  Family Member Consulted daughter  Acute Rehab OT Goals  Patient Stated Goal daughter wants patient to go to CIR  OT Goal Formulation With patient/family  Time For Goal Achievement 02/11/20  Potential to Achieve Goals Good  OT Time Calculation  OT Start Time (ACUTE ONLY) 1001  OT Stop Time (ACUTE ONLY) 1025  OT Time Calculation (min) 24 min  OT General Charges  $OT Visit 1 Visit  OT Evaluation  $OT Eval Moderate Complexity 1 Mod  Written Expression  Dominant Hand Right   Patient is s/p L craniotomy surgery resulting in functional limitations due to the deficits listed below (see OT problem list). Pt currently limited by nausea and noted to have visual changes. Pt using L eye at this time. Next session to focus on gaze stabilization with movement to attempt to progress patient OOB. Pt with EVD clamped for session by RN and then reset as soon as back in the bed.  Patient will benefit from skilled OT acutely to increase independence and safety with ADLS to allow discharge CIR.  Fleeta Emmer, OTR/L  Acute Rehabilitation Services Pager: [phone] Office: [phone] .

## 2020-01-29 LAB — PHOSPHORUS
Phosphorus: 3.1 mg/dL (ref 2.5–4.6)
Phosphorus: 2.5 mg/dL (ref 2.5–4.6)

## 2020-01-29 LAB — CBC
HCT: 24.9 % — ABNORMAL LOW (ref 36.0–46.0)
Hemoglobin: 7.7 g/dL — ABNORMAL LOW (ref 12.0–15.0)
MCH: 25.8 pg — ABNORMAL LOW (ref 26.0–34.0)
MCHC: 30.9 g/dL (ref 30.0–36.0)
MCV: 83.3 fL (ref 80.0–100.0)
Platelets: 309 10*3/uL (ref 150–400)
RBC: 2.99 MIL/uL — ABNORMAL LOW (ref 3.87–5.11)
RDW: 20.6 % — ABNORMAL HIGH (ref 11.5–15.5)
WBC: 9.7 10*3/uL (ref 4.0–10.5)
nRBC: 0 % (ref 0.0–0.2)

## 2020-01-29 LAB — GLUCOSE, CAPILLARY
Glucose-Capillary: 114 mg/dL — ABNORMAL HIGH (ref 70–99)
Glucose-Capillary: 143 mg/dL — ABNORMAL HIGH (ref 70–99)
Glucose-Capillary: 114 mg/dL — ABNORMAL HIGH (ref 70–99)
Glucose-Capillary: 107 mg/dL — ABNORMAL HIGH (ref 70–99)
Glucose-Capillary: 117 mg/dL — ABNORMAL HIGH (ref 70–99)
Glucose-Capillary: 141 mg/dL — ABNORMAL HIGH (ref 70–99)

## 2020-01-29 LAB — MAGNESIUM
Magnesium: 2 mg/dL (ref 1.7–2.4)
Magnesium: 2.1 mg/dL (ref 1.7–2.4)

## 2020-01-29 NOTE — Progress Notes (Signed)
  NEUROSURGERY PROGRESS NOTE   Pt seen and examined. No issues overnight. Pt reports improved nausea this am, HA controlled with tylenol.  EXAM: Temp:  [98 F (36.7 C)-98.8 F (37.1 C)] 98.4 F (36.9 C) (05/29 0800) Pulse Rate:  [88-121] 111 (05/29 0800) Resp:  [7-24] 17 (05/29 0800) BP: (104-177)/(60-92) 136/80 (05/29 0800) SpO2:  [96 %-100 %] 98 % (05/29 0800) Intake/Output      05/28 0701 - 05/29 0700 05/29 0701 - 05/30 0700   P.O.     I.V. (mL/kg) 3 (0.1)    NG/GT 322 60   IV Piggyback 200    Total Intake(mL/kg) 525 (9.4) 60 (1.1)   Urine (mL/kg/hr) 1475 (1.1)    Drains 42    Blood     Total Output 1517    Net -992 +60         Awake, alert, oriented Unchanged lower cranial nerve palsies Moving all extremities 5/5 EVD in place, 47cc overnight  LABS: Lab Results  Component Value Date   CREATININE 0.78 01/27/2020   BUN 13 01/26/2020   NA 140 01/26/2020   K 3.9 01/26/2020   CL 106 01/26/2020   CO2 24 01/26/2020   Lab Results  Component Value Date   WBC 9.7 01/29/2020   HGB 7.7 (L) 01/29/2020   HCT 24.9 (L) 01/29/2020   MCV 83.3 01/29/2020   PLT 309 01/29/2020     IMPRESSION: - 59 y.o. 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 Stable neurologic exam.  PLAN: - Cont EVD, plan on clamping on Monday - Cont supportive care

## 2020-01-29 NOTE — Progress Notes (Signed)
  Speech Language Pathology Treatment: Dysphagia  Patient Details Name: Cristina Conner MRN: [id number] DOB: [birth year] Today's Date: 01/29/2020 Time: [id number] SLP Time Calculation (min) (ACUTE ONLY): 30 min  Assessment / Plan / Recommendation Clinical Impression  Patient seen to address dysphagia goals with PO trials as patient currently NP with cortrak. As compared to SLP's notes for language and bedside swallow evaluations, patient is significantly more alert and responsive today. Her daughter is present during today's session and patient was sitting in recliner prior to SLP entering room. Patient was alert and oriented and daughter feels she is much more like herself today. Patient asked pertinent questions and responses were prompt and appropriate. She was able to self feed for cup sips of thin liquids (water) and bites of gelatin after SLP setup. She had one instance of throat clear and cough after sip of thin liquids, but did not exhibit any other overt s/s of aspiration or penetration. Swallow initiation was decreased but this was more in mild range. Patient consumed cup of gelatin and approximately 4-5 ounces of water and did not demonstrate any fatigue. At this time, patient appears safe to initiate full liquids diet.   HPI HPI: Pt is a 59 yo female s/p lap assisted SBR for retained capsule endoscope, Dr. Dema Severin, 5/19. 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 Pt was experiencing hydrocephalus and L hearing loss. 5/27 s/p L craniotomy for tumor resection ventriculostomy. PMH: HTN, L DA THA 2020, GERD, anxiety      SLP Plan  Continue with current plan of care       Recommendations  Diet recommendations: Thin liquid(full liquids) Liquids provided via: Cup(straws can be attempted but she is unable to use at this time) Medication Administration: Crushed with puree Supervision: Patient able  to self feed;Full supervision/cueing for compensatory strategies Compensations: Minimize environmental distractions;Slow rate;Small sips/bites;Lingual sweep for clearance of pocketing;Monitor for anterior loss Postural Changes and/or Swallow Maneuvers: Seated upright 90 degrees                Oral Care Recommendations: Oral care QID Follow up Recommendations: Inpatient Rehab SLP Visit Diagnosis: Dysphagia, unspecified (R13.10) Plan: Continue with current plan of care       GO               Sonia Baller, MA, [place] [facility] Pager: [phone]

## 2020-01-29 NOTE — Progress Notes (Signed)
Inpatient Rehabilitation-Admissions Coordinator   Noted improvement in tolerance with PT today. Will request consult order.   Raechel Ache, OTR/L  Rehab Admissions Coordinator  [phone] 01/29/2020 2:28 PM

## 2020-01-29 NOTE — Plan of Care (Signed)
?  Problem: Elimination: ?Goal: Will not experience complications related to urinary retention ?Outcome: Progressing ?  ?

## 2020-01-29 NOTE — Progress Notes (Signed)
Physical Therapy Treatment Patient Details Name: Cristina Conner MRN: [id number] DOB: July 27, 1961 Today's Date: 01/29/2020    History of Present Illness 59 yo female s/p lap assisted SBR for retained capsule endoscope, Dr. Dema Severin, 5/19. 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 5/27 s/p L craniotomy for tumor resection ventriculostom. pt was experiencing hydrocephalus and L hearing loss. Pt underwent L vestibular schwannoma resection on 5/27 with EVD placement.    PT Comments    Pt with improved level of alertness. Pt eager to get up OOB. Pt with double vision, placed patch over L eye, pt reports improvement. Pt with drop in BP from supine to sit from 169/99 down to 141/98. Pt was able to transfer to chair today with maxAx2 and RN managing EVD. Continue to recommend CIR upon d/c for maximal functional recovery. Acute PT to cont to follow.    Follow Up Recommendations  CIR;Supervision/Assistance - 24 hour     Equipment Recommendations  (defer to next venue)    Recommendations for Other Services       Precautions / Restrictions Precautions Precautions: Fall Precaution Comments: pt is a significant fall risk Restrictions Weight Bearing Restrictions: No    Mobility  Bed Mobility Overal bed mobility: Needs Assistance Bed Mobility: Supine to Sit     Supine to sit: Min assist;HOB elevated     General bed mobility comments: pt initiated moving to EOB, minA for trunk elevation, pt moved LEs on own  Transfers Overall transfer level: Needs assistance Equipment used: 2 person hand held assist Transfers: Sit to/from [facility] Sit to Stand: Mod assist;+2 physical assistance Stand pivot transfers: Max assist;+2 physical assistance(RN to manage lines)       General transfer comment: pt very unsteady, max directional verabl and tactile cues to  sequence steps to chair, pt unable to pick feet up off floor most likely due to impaired balance and vestibular system causing her fear of falling  Ambulation/Gait                 Stairs             Wheelchair Mobility    Modified Rankin (Stroke Patients Only)       Balance Overall balance assessment: Needs assistance Sitting-balance support: Single extremity supported;Feet supported Sitting balance-Leahy Scale: Fair Sitting balance - Comments: close min guard   Standing balance support: Bilateral upper extremity supported Standing balance-Leahy Scale: Poor Standing balance comment: dependent on 2 person assist                            Cognition Arousal/Alertness: Awake/alert Behavior During Therapy: Flat affect Overall Cognitive Status: Impaired/Different from baseline Area of Impairment: Problem solving                             Problem Solving: Slow processing;Difficulty sequencing;Requires verbal cues;Requires tactile cues General Comments: pt able to state she's having double vision, covered up L eye on glasses pt stated it was improved. Pt requiring more verbal cues than before surgery to complete tasks      Exercises      General Comments        Pertinent Vitals/Pain Pain Assessment: No/denies pain Pain Score: 0-No pain Pain Location: head Pain Descriptors / Indicators:  Aching Pain Intervention(s): Monitored during session    Home Living                      Prior Function            PT Goals (current goals can now be found in the care plan section) Progress towards PT goals: Progressing toward goals    Frequency    Min 4X/week      PT Plan Current plan remains appropriate    Co-evaluation              AM-PAC PT "6 Clicks" Mobility   Outcome Measure  Help needed turning from your back to your side while in a flat bed without using bedrails?: A Little Help needed moving from lying  on your back to sitting on the side of a flat bed without using bedrails?: A Little Help needed moving to and from a bed to a chair (including a wheelchair)?: A Lot Help needed standing up from a chair using your arms (e.g., wheelchair or bedside chair)?: A Lot Help needed to walk in hospital room?: Total Help needed climbing 3-5 steps with a railing? : Total 6 Click Score: 12    End of Session Equipment Utilized During Treatment: Gait belt Activity Tolerance: Treatment limited secondary to medical complications (Comment)(dec BP, nausea) Patient left: in chair;with call bell/phone within reach;with family/visitor present;with nursing/sitter in room Nurse Communication: Mobility status PT Visit Diagnosis: Difficulty in walking, not elsewhere classified (R26.2);Other abnormalities of gait and mobility (R26.89);Unsteadiness on feet (R26.81)     Time: [id number] PT Time Calculation (min) (ACUTE ONLY): 21 min  Charges:  $Neuromuscular Re-education: 8-22 mins                     Cristina Conner, PT, DPT Acute Rehabilitation Services Pager #: [phone] Office #: [phone]    Cristina Conner 01/29/2020, 1:52 PM

## 2020-01-30 DIAGNOSIS — E43 Unspecified severe protein-calorie malnutrition: Secondary | ICD-10-CM | POA: Insufficient documentation

## 2020-01-30 LAB — GLUCOSE, CAPILLARY
Glucose-Capillary: 107 mg/dL — ABNORMAL HIGH (ref 70–99)
Glucose-Capillary: 133 mg/dL — ABNORMAL HIGH (ref 70–99)
Glucose-Capillary: 112 mg/dL — ABNORMAL HIGH (ref 70–99)
Glucose-Capillary: 100 mg/dL — ABNORMAL HIGH (ref 70–99)
Glucose-Capillary: 104 mg/dL — ABNORMAL HIGH (ref 70–99)
Glucose-Capillary: 105 mg/dL — ABNORMAL HIGH (ref 70–99)

## 2020-01-30 LAB — PHOSPHORUS: Phosphorus: 2.6 mg/dL (ref 2.5–4.6)

## 2020-01-30 LAB — MAGNESIUM: Magnesium: 2 mg/dL (ref 1.7–2.4)

## 2020-01-30 MED ORDER — FAMOTIDINE 40 MG/5ML PO SUSR
20.0000 mg | Freq: Two times a day (BID) | ORAL | Status: DC
  Administered 2020-01-30 – 2020-01-31 (×3): 20 mg
  Filled 2020-01-30 (×4): qty 2.5

## 2020-01-30 NOTE — Progress Notes (Signed)
Notified Dr. Vertell Limber of drain not functioning. No output for 2 hours and no pulsating noted in tubing. Patient's neuro unchanged. Alert & oriented and ambulating. Will continue to monitor neuro status closely. CT ordered for the morning.

## 2020-01-30 NOTE — Progress Notes (Signed)
Physical Therapy Treatment Patient Details Name: Cristina Conner MRN: [id number] DOB: [birth year] Today's Date: 01/30/2020    History of Present Illness 59 yo female s/p lap assisted SBR for retained capsule endoscope, Dr. Dema Severin, 5/19. 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 5/27 s/p L craniotomy for tumor resection ventriculostom. pt was experiencing hydrocephalus and L hearing loss. Pt underwent L vestibular schwannoma resection on 5/27 with EVD placement.    PT Comments    Pt continues with c/o nausea and lightheadedness with movement. Pt with improved transfer ability and began ambulation today with RW. Pt with noted L sided weakness, double vision which is improved with L eye covered. Pt very motivated and eager to begin rehab. Acute PT to cont to follow.    Follow Up Recommendations  CIR;Supervision/Assistance - 24 hour     Equipment Recommendations       Recommendations for Other Services       Precautions / Restrictions Precautions Precautions: Fall Precaution Comments: EVD Restrictions Weight Bearing Restrictions: No    Mobility  Bed Mobility               General bed mobility comments: pt up in chair upon PT arrival  Transfers Overall transfer level: Needs assistance Equipment used: Rolling walker (2 wheeled) Transfers: Sit to/from Stand Sit to Stand: Mod assist         General transfer comment: modA to power up and steady during transition of hands from arm rest of chair to RW  Ambulation/Gait Ambulation/Gait assistance: Mod assist;+2 safety/equipment Gait Distance (Feet): 75 Feet Assistive device: Rolling walker (2 wheeled) Gait Pattern/deviations: Step-through pattern;Decreased stride length;Drifts right/left;Narrow base of support Gait velocity: slow Gait velocity interpretation: <1.8 ft/sec, indicate of risk for recurrent  falls General Gait Details: pt with improved step height/foot clearance with use of walker compared to yesterday. with onset of fatigue at 64' pt with L LE fatigue and increased difficulty clearing foot, pt stating "my left foot is dragging." Pt remains to have c/o of lightheadedness/dizziness, suspect it could be due to movement in general as it was a vestibular schwanoma, modA for walker management and to walk in a straight line, pt with narrow base of support and tencency to hover towards the R side of the walker   Stairs             Wheelchair Mobility    Modified Rankin (Stroke Patients Only)       Balance Overall balance assessment: Needs assistance         Standing balance support: Bilateral upper extremity supported Standing balance-Leahy Scale: Poor Standing balance comment: dependent on 2 person assist                            Cognition Arousal/Alertness: Awake/alert Behavior During Therapy: WFL for tasks assessed/performed Overall Cognitive Status: Impaired/Different from baseline Area of Impairment: Memory;Problem solving;Safety/judgement                     Memory: (didn't remember therapist and tech working with her yesterda)   Safety/Judgement: Decreased awareness of safety   Problem Solving: Slow processing;Difficulty sequencing;Requires verbal cues;Requires tactile cues General Comments: pt eager for rehab      Exercises General Exercises - Lower Extremity Straight Leg Raises: AROM;Left;10 reps;Seated(educated on supine in the bed  as well)    General Comments General comments (skin integrity, edema, etc.): VSS, EVD in place      Pertinent Vitals/Pain Pain Assessment: No/denies pain    Home Living                      Prior Function            PT Goals (current goals can now be found in the care plan section) Progress towards PT goals: Progressing toward goals    Frequency    Min 4X/week      PT  Plan Current plan remains appropriate    Co-evaluation              AM-PAC PT "6 Clicks" Mobility   Outcome Measure  Help needed turning from your back to your side while in a flat bed without using bedrails?: A Little Help needed moving from lying on your back to sitting on the side of a flat bed without using bedrails?: A Little Help needed moving to and from a bed to a chair (including a wheelchair)?: A Little Help needed standing up from a chair using your arms (e.g., wheelchair or bedside chair)?: A Lot Help needed to walk in hospital room?: A Lot Help needed climbing 3-5 steps with a railing? : Total 6 Click Score: 14    End of Session Equipment Utilized During Treatment: Gait belt Activity Tolerance: Patient tolerated treatment well Patient left: in chair;with call bell/phone within reach;with nursing/sitter in room Nurse Communication: Mobility status PT Visit Diagnosis: Difficulty in walking, not elsewhere classified (R26.2);Other abnormalities of gait and mobility (R26.89);Unsteadiness on feet (R26.81)     Time: [id number] PT Time Calculation (min) (ACUTE ONLY): 24 min  Charges:  $Gait Training: 23-37 mins                     Kittie Plater, PT, DPT Acute Rehabilitation Services Pager #: [phone] Office #: [phone]    Berline Lopes 01/30/2020, 2:38 PM

## 2020-01-30 NOTE — Progress Notes (Signed)
Subjective: Patient reports doing OK.  No nausea.  Objective: Vital signs in last 24 hours: Temp:  [98.4 F (36.9 C)-98.9 F (37.2 C)] 98.5 F (36.9 C) (05/30 0400) Pulse Rate:  [81-116] 107 (05/30 0700) Resp:  [0-26] 13 (05/30 0700) BP: (116-164)/(72-105) 156/94 (05/30 0600) SpO2:  [96 %-100 %] 98 % (05/29 2200)  Intake/Output from previous day: 05/29 0701 - 05/30 0700 In: 1254.3 [I.V.:100; NG/GT:1104.3; IV Piggyback:50] Out: 1133 [Urine:1100; Drains:33] Intake/Output this shift: No intake/output data recorded.  Physical Exam: Dressing CDI.  EVD draining well.  Left facial paresis, MAEW.  Lab Results: Recent Labs    01/27/20 2130 01/29/20 0526  WBC 9.3 9.7  HGB 7.5* 7.7*  HCT 24.9* 24.9*  PLT 322 309   BMET Recent Labs    01/27/20 2130  CREATININE 0.78    Studies/Results: MR BRAIN W WO CONTRAST  Result Date: 01/28/2020 CLINICAL DATA:  Benign neoplasm of the brain/CNS. Craniotomy for tumor resection. EXAM: MRI HEAD WITHOUT AND WITH CONTRAST TECHNIQUE: Multiplanar, multiecho pulse sequences of the brain and surrounding structures were obtained without and with intravenous contrast. CONTRAST:  73mL GADAVIST GADOBUTROL 1 MMOL/ML IV SOLN COMPARISON:  None. 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 Widening of the left cerebellopontine angle cistern is noted with small pneumocephalus. 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 Small focus of cytotoxic edema is also noted in the inferior left cerebellar hemisphere. Residual tumor is noted within the left internal auditory canal with no evidence of residual tumor within the cerebellopontine angle cistern. Persistent dilatation of the supratentorial ventricles seen with associated T2 hyperintensity of the  periventricular white matter. External ventricular drain is noted through the left occipital lobe with the tip terminating in the quadrigeminal cistern. Note that the drain does not appear to communicate with the ventricular system. Vascular: Normal flow voids. Skull and upper cervical spine: Normal marrow signal. Sinuses/Orbits: Mucous retention cyst in left maxillary sinus. Other: Left mastoid effusion. IMPRESSION: 1. 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 Cytotoxic edema noted at the margins of the surgical cavity in the left middle cerebellar peduncle, lateral aspect of the pons and medulla. 2. External ventricular drain along the left occipital lobe terminating in the quadrigeminal cistern without effectively communicating with the ventricular system. These results will be called to the ordering clinician or representative by the Radiologist Assistant, and communication documented in the PACS or [employer]. Electronically Signed   By: Pedro Earls M.D.   On: 01/28/2020 14:46    Assessment/Plan: Patient is doing well.  EVD is draining well.  Continue drain today, plan on clamping tomorrow, likely remove Tuesday per Dr. Zada Finders.  Will advance diet today.    LOS: 12 days    Peggyann Shoals, MD 01/30/2020, 7:51 AM

## 2020-01-31 ENCOUNTER — Inpatient Hospital Stay (HOSPITAL_COMMUNITY): Payer: BC Managed Care – PPO

## 2020-01-31 ENCOUNTER — Encounter: Payer: Self-pay | Admitting: *Deleted

## 2020-01-31 LAB — GLUCOSE, CAPILLARY
Glucose-Capillary: 101 mg/dL — ABNORMAL HIGH (ref 70–99)
Glucose-Capillary: 120 mg/dL — ABNORMAL HIGH (ref 70–99)
Glucose-Capillary: 131 mg/dL — ABNORMAL HIGH (ref 70–99)
Glucose-Capillary: 107 mg/dL — ABNORMAL HIGH (ref 70–99)
Glucose-Capillary: 90 mg/dL (ref 70–99)

## 2020-01-31 MED ORDER — BUSPIRONE HCL 5 MG PO TABS
5.0000 mg | ORAL_TABLET | Freq: Three times a day (TID) | ORAL | Status: DC
  Administered 2020-01-31 – 2020-02-03 (×9): 5 mg via ORAL
  Filled 2020-01-31 (×8): qty 1

## 2020-01-31 MED ORDER — METOPROLOL TARTRATE 25 MG PO TABS
25.0000 mg | ORAL_TABLET | Freq: Two times a day (BID) | ORAL | Status: DC
  Administered 2020-01-31 – 2020-02-03 (×6): 25 mg via ORAL
  Filled 2020-01-31 (×5): qty 1

## 2020-01-31 MED ORDER — ACETAMINOPHEN 500 MG PO TABS
1000.0000 mg | ORAL_TABLET | Freq: Four times a day (QID) | ORAL | Status: DC
  Administered 2020-01-31 – 2020-02-03 (×11): 1000 mg via ORAL
  Filled 2020-01-31 (×10): qty 2

## 2020-01-31 MED ORDER — FAMOTIDINE 20 MG PO TABS
20.0000 mg | ORAL_TABLET | Freq: Two times a day (BID) | ORAL | Status: DC
  Administered 2020-01-31 – 2020-02-03 (×6): 20 mg via ORAL
  Filled 2020-01-31 (×5): qty 1

## 2020-01-31 NOTE — Plan of Care (Signed)
  Problem: Education: Goal: Knowledge of General Education information will improve Description Including pain rating scale, medication(s)/side effects and non-pharmacologic comfort measures Outcome: Progressing   

## 2020-01-31 NOTE — Progress Notes (Signed)
Physical Therapy Treatment Patient Details Name: Cristina Conner MRN: [id number] DOB: Oct 07, 1960 Today's Date: 01/31/2020    History of Present Illness 59 yo female s/p lap assisted SBR for retained capsule endoscope, Dr. Dema Severin, 5/19. 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 5/27 s/p L craniotomy for tumor resection ventriculostom. pt was experiencing hydrocephalus and L hearing loss. Pt underwent L vestibular schwannoma resection on 5/27 with EVD placement.    PT Comments    Pt continues to be very motivated to progress however felt very nauseated and lightheaded today. Pt agreed to EOB exercises and reports she ambulated with RN staff earlier today. Pt completed resisted LE there ex. Pt reports fatigue. Acute PT to cont to follow.    Follow Up Recommendations  CIR;Supervision/Assistance - 24 hour     Equipment Recommendations       Recommendations for Other Services       Precautions / Restrictions Precautions Precautions: Fall Precaution Comments: EVD Restrictions Weight Bearing Restrictions: No    Mobility  Bed Mobility Overal bed mobility: Needs Assistance Bed Mobility: Supine to Sit;Sit to Supine     Supine to sit: Min guard;HOB elevated Sit to supine: Min guard   General bed mobility comments: increased time but able to transfer without external assist, HOB very elevated  Transfers                 General transfer comment: deferred OOB due to pt with c/o nausea and lightheadedness  Ambulation/Gait                 Stairs             Wheelchair Mobility    Modified Rankin (Stroke Patients Only)       Balance Overall balance assessment: Needs assistance Sitting-balance support: Single extremity supported;Feet supported Sitting balance-Leahy Scale: Fair Sitting balance - Comments: close min guard(pt able to  complete seated bilat LE exercises)                                    Cognition Arousal/Alertness: Awake/alert Behavior During Therapy: WFL for tasks assessed/performed Overall Cognitive Status: Within Functional Limits for tasks assessed                                 General Comments: pt aware of deficits and limitations      Exercises General Exercises - Lower Extremity Long Arc Quad: AROM;Strengthening;10 reps;Seated(with manual resistance) Hip ABduction/ADduction: AROM;Both;10 reps;Seated(with manual resistance) Hip Flexion/Marching: AROM;Both;10 reps;Seated(with manual resistance)    General Comments General comments (skin integrity, edema, etc.): VSS, pt now able to close L eye and reports no longer having double vision      Pertinent Vitals/Pain Pain Assessment: No/denies pain    Home Living                      Prior Function            PT Goals (current goals can now be found in the care plan section) Progress towards PT goals: Progressing toward goals    Frequency    Min 4X/week      PT Plan Current plan remains appropriate    Co-evaluation  AM-PAC PT "6 Clicks" Mobility   Outcome Measure  Help needed turning from your back to your side while in a flat bed without using bedrails?: A Little Help needed moving from lying on your back to sitting on the side of a flat bed without using bedrails?: A Little Help needed moving to and from a bed to a chair (including a wheelchair)?: A Little Help needed standing up from a chair using your arms (e.g., wheelchair or bedside chair)?: A Little Help needed to walk in hospital room?: A Little Help needed climbing 3-5 steps with a railing? : A Lot 6 Click Score: 17    End of Session   Activity Tolerance: Patient tolerated treatment well Patient left: in bed;with call bell/phone within reach;with bed alarm set Nurse Communication: Mobility status PT  Visit Diagnosis: Difficulty in walking, not elsewhere classified (R26.2);Other abnormalities of gait and mobility (R26.89);Unsteadiness on feet (R26.81)     Time: [id number] PT Time Calculation (min) (ACUTE ONLY): 15 min  Charges:  $Therapeutic Exercise: 8-22 mins                     Kittie Plater, PT, DPT Acute Rehabilitation Services Pager #: [phone] Office #: [phone]    Berline Lopes 01/31/2020, 2:21 PM

## 2020-01-31 NOTE — Progress Notes (Signed)
Subjective: Patient reports feeling better.  No headache.  Objective: Vital signs in last 24 hours: Temp:  [97.6 F (36.4 C)-100.2 F (37.9 C)] 100.2 F (37.9 C) (05/31 0800) Pulse Rate:  [77-103] 92 (05/31 0400) Resp:  [9-21] 13 (05/31 0900) BP: (133-164)/(74-94) 157/88 (05/31 0900) SpO2:  [98 %-100 %] 100 % (05/31 0300) Weight:  [55.7 kg] 55.7 kg (05/31 0500)  Intake/Output from previous day: 05/30 0701 - 05/31 0700 In: 680 [P.O.:440; I.V.:10; NG/GT:230] Out: 1008 [Urine:1000; Drains:8] Intake/Output this shift: Total I/O In: 250 [P.O.:200; NG/GT:50] Out: -   Physical Exam: Facial weakness on left persists, but is unchanged.  No eye closure.  Dressing CDI.  Up in chair.    Lab Results: Recent Labs    01/29/20 0526  WBC 9.7  HGB 7.7*  HCT 24.9*  PLT 309   BMET No results for input(s): NA, K, CL, CO2, GLUCOSE, BUN, CREATININE, CALCIUM in the last 72 hours.  Studies/Results: CT HEAD WO CONTRAST  Result Date: 01/31/2020 CLINICAL DATA:  Hydrocephalus EXAM: CT HEAD WITHOUT CONTRAST TECHNIQUE: Contiguous axial images were obtained from the base of the skull through the vertex without intravenous contrast. COMPARISON:  None. FINDINGS: Brain: Left parietal approach extraventricular drain with decreased hydrocephalus. Lateral ventricles remain mildly dilated. There is periventricular white matter hypoattenuation. No acute hemorrhage. Small amount of pneumocephalus at the right frontal pole and in the left lateral ventricle. Vascular: No abnormal hyperdensity of the major intracranial arteries or dural venous sinuses. No intracranial atherosclerosis. Skull: Left retromastoid craniectomy. Sinuses/Orbits: Fluid in left mastoid air cells. The orbits are normal. IMPRESSION: Decreased hydrocephalus status post left parietal approach extraventricular drain placement. Electronically Signed   By: Ulyses Jarred M.D.   On: 01/31/2020 04:42    Assessment/Plan: Patient is progressing nicely.   EVD stopped working yesterday.  Head CT shows improvement in hydrocephalus and complete tumor resection without apparent complication.  I will discontinue the drain today.  Continue to advance diet per speech, with possible cessation of supplemental feedings.  Observe in ICU today.    LOS: 13 days    Peggyann Shoals, MD 01/31/2020, 9:51 AM

## 2020-01-31 NOTE — Progress Notes (Signed)
  Speech Language Pathology Treatment: Dysphagia;Cognitive-Linquistic  Patient Details Name: Cristina Conner MRN: [id number] DOB: 1961-03-15 Today's Date: 01/31/2020 Time: 1000-1030 SLP Time Calculation (min) (ACUTE ONLY): 30 min  Assessment / Plan / Recommendation Clinical Impression  Pt demonstrates significant improved arousal. She is upright in chair, able to follow directions to compensate for left labial and lingual weakness. Most effective method were straw sips at midline with pt applying pressure with her fingers to improve left labial seal. Pt demonstrated ability to self feed with a spoon with min verbal cues to place spoon to midline or right. Pt carried over strategies through session. Addressed persistent mild short term memory impairment with basic strategies to review events ot the day periodically. Pt was able to return demonstrate repeating events with carrier phrases to cue pt. Pt may advance to dys 2 solids and thin liquids. Supplemental nutrition no longer necessary as intake is good. Will f/u, recommend CIR at d/c.    HPI HPI: Pt is a 59 yo female s/p lap assisted SBR for retained capsule endoscope, Dr. Dema Severin, 5/19. 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 Pt was experiencing hydrocephalus and L hearing loss. 5/27 s/p L craniotomy for tumor resection ventriculostomy. PMH: HTN, L DA THA 2020, GERD, anxiety      SLP Plan  Continue with current plan of care       Recommendations  Diet recommendations: Dysphagia 2 (fine chop);Thin liquid Liquids provided via: Straw Medication Administration: Crushed with puree Supervision: Patient able to self feed;Full supervision/cueing for compensatory strategies Compensations: Minimize environmental distractions;Slow rate;Small sips/bites;Lingual sweep for clearance of pocketing;Monitor for anterior loss Postural Changes and/or Swallow  Maneuvers: Seated upright 90 degrees                Oral Care Recommendations: Oral care BID Follow up Recommendations: Inpatient Rehab Plan: Continue with current plan of care       GO                Cristina Conner, Katherene Ponto 01/31/2020, 11:48 AM

## 2020-01-31 NOTE — Consult Note (Signed)
Physical Medicine and Rehabilitation Consult   Reason for Consult: Functional deficits  Referring Physician: Dr.    HPI: Cristina Conner is a 59 y.o. female with history of N/V, anxiety d/o, L-THR 12/20, recent small bowel inflammatory changes with capsule endoscopy which unfortunately did not pass thorough. Follow up CT abdomen showed resolution of inflammatory changes but patient continued to have intermittent N/V with constipation, weakness and partial SB obstruction. Due to concerns of stricutre or adhesive disease, she was admitted on 01/18/20 for hydration and  and underwent laparoscopy with lap SB resection and capsule removal on 05/19 by Dr.  Dema Severin.  Post op, hospitalist consulted to assist with management of tachycardia and elevated BP.  She was also found to have wide based, festinating type gait intermittently tendency for LOB during PT.   CT head done revealing moderate hydrocephalus with mass effect on left side of 4th ventricle with displacement to the right and possible mass lesion in left posterior fossa displacing brachium pontis. Dr. Annette Stable consulted for input and she was transferred to [facility] for further work up. Patient reported having progressive HA for 2 months as well as some decrease in hearing on the left.  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  She underwent left retrosigmoid craniotomy for resection of CP angle mass with placement of left occipital ventriculostomy placement by Dr. Zada Finders on 05/27. EVD removed on 5/31 as no longer working and follow up CT head showed improvement in hydrocephalus. She continues to be limited by significant vestibular symptoms, Left foot weakness with unsteady gait, bouts of lethargy, STM deficits with problems processing and dysphagia. CIR recommended due to functional decline.    Today--denies  dizziness. Took a shower independently and has been walking in the room per staff.    Pt walked with PT just prior to me seeing pt- found in hallway with PT- and was requiring Mod A to walk without AD- min A when RW was used.  Per PT, pt body kept going but her feet would stop.  Took miralax this AM and had 4 BMs today- loose, but not watery.   Review of Systems  Constitutional: Negative for chills and fever.  HENT: Positive for hearing loss (loss of hearing on the left). Negative for tinnitus.   Eyes: Positive for double vision.       Very poor vision in left eye--no vision v/s double vision.    Respiratory: Negative for cough, hemoptysis, shortness of breath and stridor.   Cardiovascular: Negative for chest pain, palpitations and leg swelling.  Gastrointestinal: Negative for abdominal pain, heartburn and nausea.  Musculoskeletal: Negative for back pain and joint pain.  Skin: Negative for itching and rash.  Neurological: Positive for dizziness. Negative for headaches.  Psychiatric/Behavioral: The patient is not nervous/anxious.   All other systems reviewed and are negative.    Past Medical History:  Diagnosis Date  . Anxiety   . Arthritis   . Diverticulosis   . GERD (gastroesophageal reflux disease)   . Hypertension   . Osteopenia   . Post-operative nausea and vomiting    vomiting after general anesthesia per pt.  . Pre-diabetes     Past Surgical History:  Procedure Laterality Date  . CESAREAN SECTION     x 2  . COLONOSCOPY    . ENDOMETRIAL ABLATION    . ESOPHAGOGASTRODUODENOSCOPY  10/2019  .  LAPAROSCOPY N/A 01/19/2020   Procedure: LAPAROSCOPY DIAGNOSTIC LAPAROTOMY WITH SMALL BOWEL RESECTION;  Surgeon: Ileana Roup, MD;  Location: [facility]S;  Service: General;  Laterality: N/A;  . PELVIC LAPAROSCOPY  1999   left salpingectomy, excision of Right, paratubal cyst  . PELVIC LAPAROSCOPY     laparoscopy with lysis of pelvic adhesions  . SHOULDER SURGERY  11/2010    "FROZEN SHOULDER"  . TOTAL HIP ARTHROPLASTY Left 08/17/2019   Procedure: LEFT TOTAL HIP ARTHROPLASTY ANTERIOR APPROACH;  Surgeon: Melrose Nakayama, MD;  Location: [facility];  Service: Orthopedics;  Laterality: Left;  . TUBAL LIGATION    . UPPER GASTROINTESTINAL ENDOSCOPY      Family History  Problem Relation Age of Onset  . Hypertension Mother   . Heart disease Mother   . Diabetes Mother   . Hypertension Father   . Colon cancer Neg Hx   . Esophageal cancer Neg Hx   . Stomach cancer Neg Hx   . Rectal cancer Neg Hx   . Colon polyps Neg Hx     Social History:  Widowed last July--has been sedentary for the past year. 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 Daughter from [place] has moved in to help post surgery. She reports that she has never smoked. She has never used smokeless tobacco. She reports previous alcohol use. She reports that she does not use drugs.   Allergies  Allergen Reactions  . Hydrocodone Nausea And Vomiting    Medications Prior to Admission  Medication Sig Dispense Refill  . acetaminophen (TYLENOL) 500 MG tablet Take 1,000 mg by mouth as needed for moderate pain.    Marland Kitchen dicyclomine (BENTYL) 10 MG capsule Take 1 capsule (10 mg total) by mouth 3 (three) times daily before meals. 90 capsule 3  . eszopiclone (LUNESTA) 1 MG TABS tablet Take 1 mg by mouth at bedtime as needed for sleep.     . famotidine (PEPCID) 40 MG tablet Take 40 mg by mouth daily.     . Fe Fum-FePoly-Vit C-Vit B3 (INTEGRA) 62.5-62.5-40-3 MG CAPS Take 62.5 mg by mouth daily. 30 capsule 6  . FLUoxetine (PROZAC) 10 MG tablet Take 1 tablet (10 mg total) by mouth daily. 90 tablet 0  . ondansetron (ZOFRAN) 4 MG tablet Take 1 tablet (4 mg total) by mouth every 6 (six) hours as needed for nausea or vomiting. 50 tablet 4  . mirtazapine (REMERON) 7.5 MG tablet Take 1 tablet (7.5 mg total) by mouth at bedtime. (Patient not taking: Reported on 01/18/2020) 30 tablet 1  . ondansetron  (ZOFRAN-ODT) 8 MG disintegrating tablet Take 1 tablet (8 mg total) by mouth every 8 (eight) hours as needed for nausea or refractory nausea / vomiting. (Patient not taking: Reported on 01/18/2020) 20 tablet 0    Home: [place] expects to be discharged to:: Private residence Living Arrangements: Alone Available Help at Discharge: Family Type of Home: House Home Access: Stairs to enter Technical brewer of Steps: 2 [place]: One level Bathroom Shower/Tub: Multimedia programmer: [place]: Environmental consultant - 2 wheels, Bedside commode Additional Comments: daughter will help when she goes home. Daughter is speech therapist. Pt likes to Zumba  Functional History: Prior Function Level of Independence: Independent Comments: dtr states she is independent Functional Status:  Mobility: Bed Mobility Overal bed mobility: Needs Assistance Bed Mobility: Supine to Sit, Sit to Supine Rolling: Min guard Sidelying to sit: Min guard, HOB elevated(at about 30 deg) Supine  to sit: Min guard, HOB elevated Sit to supine: Min guard Sit to sidelying: Min guard General bed mobility comments: increased time but able to transfer without external assist, HOB very elevated Transfers Overall transfer level: Needs assistance Equipment used: Rolling walker (2 wheeled) Transfers: Sit to/from Stand Sit to Stand: Mod assist Stand pivot transfers: Max assist, +2 physical assistance(RN to manage lines) General transfer comment: deferred OOB due to pt with c/o nausea and lightheadedness Ambulation/Gait Ambulation/Gait assistance: Mod assist, +2 safety/equipment Gait Distance (Feet): 75 Feet Assistive device: Rolling walker (2 wheeled) Gait Pattern/deviations: Step-through pattern, Decreased stride length, Drifts right/left, Narrow base of support General Gait Details: pt with improved step height/foot clearance with use of walker compared to yesterday. with onset of fatigue at  93' pt with L LE fatigue and increased difficulty clearing foot, pt stating "my left foot is dragging." Pt remains to have c/o of lightheadedness/dizziness, suspect it could be due to movement in general as it was a vestibular schwanoma, modA for walker management and to walk in a straight line, pt with narrow base of support and tencency to hover towards the R side of the walker Gait velocity: slow Gait velocity interpretation: <1.8 ft/sec, indicate of risk for recurrent falls    ADL: ADL Overall ADL's : Needs assistance/impaired Grooming: Wash/dry face, Moderate assistance, Bed level Upper Body Bathing: Maximal assistance Lower Body Bathing: Total assistance Upper Body Dressing : Maximal assistance Lower Body Dressing: Total assistance Toilet Transfer Details (indicate cue type and reason): unable to attempt this session due to nausea General ADL Comments: pt progresed to EOB and required return to supine due to nauseated and RN giving medication  Cognition: Cognition Overall Cognitive Status: Within Functional Limits for tasks assessed Arousal/Alertness: (drowsy) Orientation Level: Oriented X4 Attention: Sustained Sustained Attention: Impaired Sustained Attention Impairment: Functional basic Memory: Impaired Memory Impairment: Decreased recall of new information, Retrieval deficit, Storage deficit Problem Solving: Impaired Problem Solving Impairment: Functional basic Comments: slow processnig Cognition Arousal/Alertness: Awake/alert Behavior During Therapy: WFL for tasks assessed/performed Overall Cognitive Status: Within Functional Limits for tasks assessed Area of Impairment: Memory, Problem solving, Safety/judgement Orientation Level: Disoriented to, Time(disoriented to day, knows month and year) Current Attention Level: Focused Memory: (didn't remember therapist and tech working with her yesterda) Following Commands: Follows one step commands with increased  time Safety/Judgement: Decreased awareness of safety Awareness: Intellectual Problem Solving: Slow processing, Difficulty sequencing, Requires verbal cues, Requires tactile cues General Comments: pt aware of deficits and limitations   Blood pressure (!) 141/80, pulse 99, temperature 100.2 F (37.9 C), temperature source Oral, resp. rate 13, height 5\' 6"  (1.676 m), weight 55.7 kg, last menstrual period 05/01/2009, SpO2 100 %. Physical Exam  Nursing note and vitals reviewed. Constitutional: She is oriented to person, place, and time. She appears well-developed and well-nourished.  Pt seen up walking with PT and in room in bedside chair, slow to process/respond, appropriate otherwise, NAD  HENT:  Head: Normocephalic.  L behind the ear craniotomy incision with sutures in place Wearing Eyeglasses with L lens covered with tape in center L facial droop significant; tongue midline L Cheek puffy  Eyes:  R EOM intact L eye- can't look left/cannot go past midline Dysconjugate gaze; no nystagmus seen  Cardiovascular:  Borderline tachycardia- regular rhythm  Respiratory: No stridor.  CTA B/L- no W/R/R- good air movement   GI:  Soft, NT, ND, (+)BS  Hypoactive BS  Musculoskeletal:     Cervical back: Normal range of motion and neck supple.  Comments: UEs- 5/5 in biceps, triceps, WE< grip and finger abd B/L RLE- HF, KE, DF and PF 5/5 LLE_ HF 4+/5 otherwise KE/KF 5-/5 and DF and PF 5/5 L THR <1 yr ago  Did 8 point turn to sit on toilet- narrow BOS; shuffling gait seen, even with HHA  Neurological: She is alert and oriented to person, place, and time.  Left facial weakness/numbness with dysarthric speech. Left lid lag with central nystagmus and dysconjugate movement. Mild left sided weakness with decrease in fine motor movements.   Finger to nose severely impaired on LUE; slow byut intact RUE  Skin:  Scar anteriorly in L hip replacement incision   Psychiatric:  Slow to process,  tangential, but otherwise appropriate    Results for orders placed or performed during the hospital encounter of 01/18/20 (from the past 24 hour(s))  Glucose, capillary     Status: Abnormal   Collection Time: 01/30/20  3:45 PM  Result Value Ref Range   Glucose-Capillary 100 (H) 70 - 99 mg/dL  Glucose, capillary     Status: Abnormal   Collection Time: 01/30/20  7:44 PM  Result Value Ref Range   Glucose-Capillary 104 (H) 70 - 99 mg/dL   Comment 1 Notify RN    Comment 2 Document in Chart   Glucose, capillary     Status: Abnormal   Collection Time: 01/30/20 11:21 PM  Result Value Ref Range   Glucose-Capillary 105 (H) 70 - 99 mg/dL   Comment 1 Notify RN    Comment 2 Document in Chart   Glucose, capillary     Status: Abnormal   Collection Time: 01/31/20  3:21 AM  Result Value Ref Range   Glucose-Capillary 101 (H) 70 - 99 mg/dL   Comment 1 Notify RN    Comment 2 Document in Chart   Glucose, capillary     Status: Abnormal   Collection Time: 01/31/20  8:12 AM  Result Value Ref Range   Glucose-Capillary 120 (H) 70 - 99 mg/dL  Glucose, capillary     Status: Abnormal   Collection Time: 01/31/20 11:35 AM  Result Value Ref Range   Glucose-Capillary 131 (H) 70 - 99 mg/dL   Comment 1 Notify RN    Comment 2 Document in Chart    CT HEAD WO CONTRAST  Result Date: 01/31/2020 CLINICAL DATA:  Hydrocephalus EXAM: CT HEAD WITHOUT CONTRAST TECHNIQUE: Contiguous axial images were obtained from the base of the skull through the vertex without intravenous contrast. COMPARISON:  None. FINDINGS: Brain: Left parietal approach extraventricular drain with decreased hydrocephalus. Lateral ventricles remain mildly dilated. There is periventricular white matter hypoattenuation. No acute hemorrhage. Small amount of pneumocephalus at the right frontal pole and in the left lateral ventricle. Vascular: No abnormal hyperdensity of the major intracranial arteries or dural venous sinuses. No intracranial  atherosclerosis. Skull: Left retromastoid craniectomy. Sinuses/Orbits: Fluid in left mastoid air cells. The orbits are normal. IMPRESSION: Decreased hydrocephalus status post left parietal approach extraventricular drain placement. Electronically Signed   By: Ulyses Jarred M.D.   On: 01/31/2020 04:42     Assessment/Plan: Diagnosis: craniotomy for vestibular schwannoma On L side- with dysphagia, L facial droop, L hearing loss; s/p hydrocephalus.  1. Does the need for close, 24 hr/day medical supervision in concert with the patient's rehab needs make it unreasonable for this patient to be served in a less intensive setting? Yes 2. Co-Morbidities requiring supervision/potential complications: dysphagia, slowed processing, dizziness, poor balance-headaches 3. Due to bowel management, safety, skin/wound care,  disease management, medication administration, pain management and patient education, does the patient require 24 hr/day rehab nursing? Yes 4. Does the patient require coordinated care of a physician, rehab nurse, therapy disciplines of PT, OT, SLP to address physical and functional deficits in the context of the above medical diagnosis(es)? Yes Addressing deficits in the following areas: balance, endurance, locomotion, strength, transferring, bathing, dressing, feeding, grooming, toileting, cognition, speech and swallowing 5. Can the patient actively participate in an intensive therapy program of at least 3 hrs of therapy per day at least 5 days per week? Yes 6. The potential for patient to make measurable gains while on inpatient rehab is good 7. Anticipated functional outcomes upon discharge from inpatient rehab are modified independent and supervision  with PT, modified independent and supervision with OT, modified independent and supervision with SLP. 8. Estimated rehab length of stay to reach the above functional goals is: 2-2.5 weeks 9. Anticipated discharge destination: Home 10. Overall  Rehab/Functional Prognosis: good  RECOMMENDATIONS: This patient's condition is appropriate for continued rehabilitative care in the following setting: CIR Patient has agreed to participate in recommended program. Potentially Note that insurance prior authorization may be required for reimbursement for recommended care.  Comment: 1. Pt would strongly benefit from CIR due to impaired balance, dizziness, L hearing loss, s/p hydrocephalus and drain; dysphagia, and decreased cognition-  2. Will submit name for insurance approval- pt is EXCELLENT candidate 3. Suggest 2 to 2.5 weeks for pt 4. Medical issues per primary team 5. Thank you for this consult    Bary Leriche, PA-C 01/31/2020    I have personally performed a face to face diagnostic evaluation of this patient and formulated the key components of the plan.  Additionally, I have personally reviewed laboratory data, imaging studies, as well as relevant notes and concur with the physician assistant's documentation above.

## 2020-02-01 ENCOUNTER — Ambulatory Visit: Payer: BC Managed Care – PPO | Admitting: Psychology

## 2020-02-01 LAB — GLUCOSE, CAPILLARY
Glucose-Capillary: 102 mg/dL — ABNORMAL HIGH (ref 70–99)
Glucose-Capillary: 108 mg/dL — ABNORMAL HIGH (ref 70–99)
Glucose-Capillary: 98 mg/dL (ref 70–99)

## 2020-02-01 MED ORDER — ENSURE ENLIVE PO LIQD
237.0000 mL | Freq: Two times a day (BID) | ORAL | Status: DC
  Administered 2020-02-01 – 2020-02-03 (×5): 237 mL via ORAL

## 2020-02-01 MED ORDER — IBUPROFEN 200 MG PO TABS
600.0000 mg | ORAL_TABLET | Freq: Four times a day (QID) | ORAL | Status: DC | PRN
  Administered 2020-02-01: 600 mg via ORAL
  Filled 2020-02-01: qty 3

## 2020-02-01 NOTE — Progress Notes (Signed)
  Speech Language Pathology Treatment: Dysphagia;Cognitive-Linquistic  Patient Details Name: Cristina Conner MRN: [id number] DOB: 1960/09/09 Today's Date: 02/01/2020 Time: [id number] SLP Time Calculation (min) (ACUTE ONLY): 24 min  Assessment / Plan / Recommendation Clinical Impression  Pt was seen during breakfast meal with advanced tray of Dys 2 solids, thin liquids. She recalled the need for use of straw and manual assist for L-sided labial seal, with Min cues provided for working memory and implementation of strategies during intake. No overt s/s of aspiration are noted even when she forgets to use them, but she has increased difficulty generating enough seal/pressure to obtain liquid via straw. Mild amounts of anterior spillage is noted but pt shows good emergent awareness. Today she acknowledges reduced sensation of the left ~1/3 of her lips; not always able to feel anterior spillage but using her towel to wipe her mouth out of precaution. At the end of the session SLP assisted pt to her sink with Min cues for safety, in order for her to rinse out her mouth - again, showing anticipatory awareness of potential pocketing. Would continue current diet at this time given level of oral deficits and increased time needed to progress through meal. SLP also discussed current level of function in terms of motor speech with improved volume from Friday assisting with intelligibility at the conversational level. She will still benefit from SLP f/u to address speech and swallowing as well as differential diagnosis of higher level cognitive abilities.     HPI HPI: Pt is a 59 yo female s/p lap assisted SBR for retained capsule endoscope, Dr. Dema Severin, 5/19. 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 Pt was experiencing hydrocephalus and L hearing loss. 5/27 s/p L craniotomy for tumor resection ventriculostomy. PMH: HTN, L DA  THA 2020, GERD, anxiety      SLP Plan  Continue with current plan of care       Recommendations  Diet recommendations: Dysphagia 2 (fine chop);Thin liquid Liquids provided via: Straw Medication Administration: Crushed with puree Supervision: Patient able to self feed;Full supervision/cueing for compensatory strategies Compensations: Minimize environmental distractions;Slow rate;Small sips/bites;Lingual sweep for clearance of pocketing;Monitor for anterior loss Postural Changes and/or Swallow Maneuvers: Seated upright 90 degrees                Oral Care Recommendations: Oral care BID Follow up Recommendations: Inpatient Rehab SLP Visit Diagnosis: Dysphagia, unspecified (R13.10);Dysarthria and anarthria (R47.1) Plan: Continue with current plan of care       GO                 Osie Bond., M.A. [place] [facility] Pager [phone] Office [phone]  02/01/2020, 9:39 AM

## 2020-02-01 NOTE — Progress Notes (Addendum)
Inpatient Rehabilitation Admissions Coordinator  Inpatient rehab consult received. I met with patient and her daughter, Julia at bedside. We discussed goals and expectations of a possible inpt rehab admit. Dr. Lovorn to see patient today. I will follow up tomorrow and begin insurance authorization for a  possible admit.   , RN, MSN Rehab Admissions Coordinator [phone] 02/01/2020 1:32 PM  

## 2020-02-01 NOTE — Progress Notes (Signed)
Occupational Therapy Treatment Patient Details Name: Cristina Conner MRN: [id number] DOB: Nov 13, 1960 Today's Date: 02/01/2020    History of present illness 59 yo female s/p lap assisted SBR for retained capsule endoscope, Dr. Dema Severin, 5/19. 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 5/27 s/p L craniotomy for tumor resection ventriculostom. pt was experiencing hydrocephalus and L hearing loss. Pt underwent L vestibular schwannoma resection on 5/27 with EVD placement.   OT comments  Pt is making excellent gains.  She is now able to perform ADLs with min guard to min A.  She continues with diplopia, intermittent dizziness, impaired cognition, and impaired balance.  Feel she would benefit from CIR to allow her to return home at mod I level with ADLs and min A - supervision for IADLs.  Pt is very motivated to regain independence, and has good support from daughter.    Follow Up Recommendations  CIR    Equipment Recommendations  None recommended by OT    Recommendations for Other Services      Precautions / Restrictions Precautions Precautions: Fall Precaution Comments: EVD Restrictions Weight Bearing Restrictions: No       Mobility Bed Mobility Overal bed mobility: Needs Assistance         Sit to supine: Supervision      Transfers Overall transfer level: Needs assistance Equipment used: Rolling walker (2 wheeled) Transfers: Sit to/from [employer] Transfers   Stand pivot transfers: Min guard       General transfer comment: min guard for safety     Balance Overall balance assessment: Needs assistance Sitting-balance support: Feet supported Sitting balance-Leahy Scale: Fair     Standing balance support: Single extremity supported;During functional activity Standing balance-Leahy Scale: Poor Standing balance comment: reliant on UE support                             ADL either performed or assessed with clinical judgement   ADL Overall ADL's : Needs assistance/impaired     Grooming: Wash/dry hands;Wash/dry face;Oral care;Brushing hair;Min guard;Standing Grooming Details (indicate cue type and reason): Pt brushed teeth, and forgot to wipe her mouth leaving  toothpaste on her mouth.  Cues provided for sequencing  Upper Body Bathing: Supervision/ safety;Set up;Sitting   Lower Body Bathing: Minimal assistance;Sit to/from stand   Upper Body Dressing : Moderate assistance;Sitting   Lower Body Dressing: Minimal assistance;Sit to/from stand   Toilet Transfer: Min guard;Ambulation;Comfort height toilet;RW;Grab bars   Toileting- Clothing Manipulation and Hygiene: Minimal assistance;Sit to/from stand       Functional mobility during ADLs: Min guard;Rolling walker       Vision   Additional Comments: Pt continues to complain of diplopia.  dysconjugate gaze noted.  She is Rt eye dominant.  Partial nasal occlusion of glasses performed with reduction in diplopia    Perception     Praxis      Cognition Arousal/Alertness: Awake/alert Behavior During Therapy: WFL for tasks assessed/performed Overall Cognitive Status: Impaired/Different from baseline Area of Impairment: Memory;Following commands;Problem solving;Attention                   Current Attention Level: Selective Memory: Decreased short-term memory Following Commands: Follows one step commands consistently;Follows multi-step commands inconsistently   Awareness: Emergent Problem Solving: Slow processing;Requires verbal cues General Comments: Therapist worked with pt, assisted  her to bed,  walked out of the room, then walked back in, and pt asked "Oh, are you here to walk with me?"        Exercises     Shoulder Instructions       General Comments VSS     Pertinent Vitals/ Pain       Pain Assessment: No/denies pain Faces Pain Scale: No  hurt  Home Living                                          Prior Functioning/Environment              Frequency  Min 2X/week        Progress Toward Goals  OT Goals(current goals can now be found in the care plan section)  Progress towards OT goals: Progressing toward goals     Plan Discharge plan remains appropriate    Co-evaluation                 AM-PAC OT "6 Clicks" Daily Activity     Outcome Measure   Help from another person eating meals?: A Little Help from another person taking care of personal grooming?: A Little Help from another person toileting, which includes using toliet, bedpan, or urinal?: A Little Help from another person bathing (including washing, rinsing, drying)?: A Little Help from another person to put on and taking off regular upper body clothing?: A Little Help from another person to put on and taking off regular lower body clothing?: A Little 6 Click Score: 18    End of Session Equipment Utilized During Treatment: Gait belt;Rolling walker  OT Visit Diagnosis: Unsteadiness on feet (R26.81);Muscle weakness (generalized) (M62.81)   Activity Tolerance Patient tolerated treatment well   Patient Left in bed;with call bell/phone within reach;with bed alarm set   Nurse Communication Mobility status        Time: [id number] OT Time Calculation (min): 23 min  Charges: OT General Charges $OT Visit: 1 Visit OT Treatments $Self Care/Home Management : 8-22 mins $Therapeutic Activity: 8-22 mins  Nilsa Nutting., OTR/L Acute Rehabilitation Services Pager [phone] Office [phone]    Lucille Passy M 02/01/2020, 10:26 AM

## 2020-02-01 NOTE — Progress Notes (Signed)
Nutrition Follow-up  DOCUMENTATION CODES:   Severe malnutrition in context of chronic illness  INTERVENTION:   Ensure Enlive po BID, each supplement provides 350 kcal and 20 grams of protein  Magic cup TID with meals, each supplement provides 290 kcal and 9 grams of protein  Encourage PO intake   NUTRITION DIAGNOSIS:   Severe Malnutrition related to chronic illness, altered GI function as evidenced by energy intake < or equal to 75% for > or equal to 1 month, percent weight loss. Ongoing.   GOAL:   Patient will meet greater than or equal to 90% of their needs Progressing.   MONITOR:   PO intake, Supplement acceptance, Diet advancement  REASON FOR ASSESSMENT:   Consult Enteral/tube feeding initiation and management  ASSESSMENT:   Pt with PMH of several month hx of abd pain, nausea, and vomiting who was admitted 5/18 with SBO s/p SBR 5/19 who developed weakness prior to d/c. Pt found to have large vestibular schannoma and hydrocephalus.    Pt discussed during ICU rounds and with RN.  Spoke with patient. Discussed importance of adequate nutrition with patient. Pt agreeable to trying oral nutrition supplements. She has had Boost and liked it so we will try ensure enlive and magic cups.  5/18 admit with SBO 5/19 s/p SBR 5/24 tx Neuro ICU with hydrocephalus 5/27 s/p resection with EVD  5/28 failed swallow eval; cortrak placed 5/31 TF d/c'ed/cortrak removed  Medications reviewed: colace, folic acid, MVI, miralax, vitamin B12 Labs reviewed      Diet Order:   Diet Order            DIET DYS 2 Room service appropriate? Yes; Fluid consistency: Thin  Diet effective now              EDUCATION NEEDS:   Education needs have been addressed  Skin:  Skin Assessment: Reviewed RN Assessment  Last BM:  5/31  Height:   Ht Readings from Last 1 Encounters:  01/27/20 5\' 6"  (1.676 m)    Weight:   Wt Readings from Last 1 Encounters:  02/01/20 55.7 kg    Ideal  Body Weight:  59 kg  BMI:  Body mass index is 19.82 kg/m.  Estimated Nutritional Needs:   Kcal:  1700-1900  Protein:  85-100 grams  Fluid:  > 1.7 L/day  Lockie Pares., RD, LDN, CNSC See AMiON for contact information

## 2020-02-01 NOTE — Progress Notes (Signed)
Neurosurgery Service Progress Note  Subjective: No acute events overnight, drained removed yesterday, no HA/N/V  Objective: Vitals:   02/01/20 0400 02/01/20 0500 02/01/20 0600 02/01/20 0700  BP: (!) 142/90 (!) 125/99 105/88 (!) 148/82  Pulse: 95 100 95 98  Resp: 16 16 (!) 24 (!) 25  Temp: 99 F (37.2 C)     TempSrc: Oral     SpO2: 98% 100% 100% 100%  Weight:  55.7 kg    Height:       Temp (24hrs), Avg:98.8 F (37.1 C), Min:98.1 F (36.7 C), Max:100.2 F (37.9 C)  CBC Latest Ref Rng & Units 01/29/2020 01/27/2020 01/26/2020  WBC 4.0 - 10.5 K/uL 9.7 9.3 15.5(H)  Hemoglobin 12.0 - 15.0 g/dL 7.7(L) 7.5(L) 9.6(L)  Hematocrit 36.0 - 46.0 % 24.9(L) 24.9(L) 31.2(L)  Platelets 150 - 400 K/uL 309 322 419(H)   BMP Latest Ref Rng & Units 01/27/2020 01/26/2020 01/24/2020  Glucose 70 - 99 mg/dL - 133(H) -  BUN 6 - 20 mg/dL - 13 -  Creatinine 0.44 - 1.00 mg/dL 0.78 0.84 0.92  BUN/Creat Ratio 9 - 23 - - -  Sodium 135 - 145 mmol/L - 140 -  Potassium 3.5 - 5.1 mmol/L - 3.9 -  Chloride 98 - 111 mmol/L - 106 -  CO2 22 - 32 mmol/L - 24 -  Calcium 8.9 - 10.3 mg/dL - 9.4 -    Intake/Output Summary (Last 24 hours) at 02/01/2020 0737 Last data filed at 01/31/2020 1400 Gross per 24 hour  Intake 450 ml  Output --  Net 450 ml    Current Facility-Administered Medications:  .  0.9 %  sodium chloride infusion, 250 mL, Intravenous, PRN, Michael Boston, MD, Last Rate: 10 mL/hr at 01/27/20 2355, 250 mL at 01/27/20 2355 .  acetaminophen (TYLENOL) tablet 650 mg, 650 mg, Per Tube, Q4H PRN **OR** acetaminophen (TYLENOL) suppository 650 mg, 650 mg, Rectal, Q4H PRN, Judith Part, MD .  acetaminophen (TYLENOL) tablet 1,000 mg, 1,000 mg, Oral, Q6H, Plummer Matich, Joyice Faster, MD, 1,000 mg at 02/01/20 0420 .  alum & mag hydroxide-simeth (MAALOX/MYLANTA) 200-200-20 MG/5ML suspension 30 mL, 30 mL, Per Tube, Q6H PRN, Judith Part, MD .  bisacodyl (DULCOLAX) suppository 10 mg, 10 mg, Rectal, Q12H PRN, Michael Boston, MD, 10 mg at 01/22/20 1147 .  busPIRone (BUSPAR) tablet 5 mg, 5 mg, Oral, TID, Judith Part, MD, 5 mg at 01/31/20 2136 .  chlorhexidine (PERIDEX) 0.12 % solution 15 mL, 15 mL, Mouth Rinse, BID, Blaine Hari, Joyice Faster, MD, 15 mL at 01/31/20 0933 .  Chlorhexidine Gluconate Cloth 2 % PADS 6 each, 6 each, Topical, Daily, Kipp Brood, MD, 6 each at 02/01/20 [phone] .  dicyclomine (BENTYL) capsule 10 mg, 10 mg, Per Tube, TID AC, Glennon Kopko, Joyice Faster, MD, 10 mg at 01/31/20 1742 .  diphenhydrAMINE (BENADRYL) capsule 25 mg, 25 mg, Oral, Q6H PRN **OR** diphenhydrAMINE (BENADRYL) injection 25 mg, 25 mg, Intravenous, Q6H PRN, Ileana Roup, MD .  docusate (COLACE) 50 MG/5ML liquid 100 mg, 100 mg, Per Tube, BID, Judith Part, MD, 100 mg at 01/30/20 2141 .  famotidine (PEPCID) tablet 20 mg, 20 mg, Oral, BID, Judith Part, MD, 20 mg at 01/31/20 2135 .  feeding supplement (VITAL AF 1.2 CAL) liquid 1,000 mL, 1,000 mL, Per Tube, Continuous, Dearis Danis, Joyice Faster, MD, Stopped at 01/30/20 1200 .  FLUoxetine (PROZAC) capsule 10 mg, 10 mg, Per Tube, Daily, Judith Part, MD, 10 mg at 01/31/20 0934 .  folic acid (FOLVITE) tablet 1 mg, 1 mg, Per Tube, Daily, Tayna Smethurst A, MD, 1 mg at 01/31/20 0934 .  heparin injection 5,000 Units, 5,000 Units, Subcutaneous, Q8H, Mariah Gerstenberger, Joyice Faster, MD, 5,000 Units at 02/01/20 0615 .  hydrALAZINE (APRESOLINE) tablet 25 mg, 25 mg, Per Tube, Q4H PRN, Judith Part, MD .  HYDROcodone-acetaminophen (NORCO/VICODIN) 5-325 MG per tablet 1 tablet, 1 tablet, Per Tube, Q4H PRN, Judith Part, MD, 1 tablet at 01/31/20 0451 .  HYDROmorphone (DILAUDID) injection 0.5 mg, 0.5 mg, Intravenous, Q3H PRN, Judith Part, MD, 0.5 mg at 01/28/20 0819 .  hydroxypropyl methylcellulose / hypromellose (ISOPTO TEARS / GONIOVISC) 2.5 % ophthalmic solution 1 drop, 1 drop, Left Eye, Q2H, Lanice Folden, Joyice Faster, MD, 1 drop at 02/01/20 [phone] .  ibuprofen (ADVIL)  tablet 600 mg, 600 mg, Per Tube, Q6H PRN, Judith Part, MD, 600 mg at 01/31/20 1253 .  iron polysaccharides (NIFEREX) capsule 150 mg, 150 mg, Per Tube, Daily, Judith Part, MD, 150 mg at 01/31/20 0934 .  labetalol (NORMODYNE) injection 10-40 mg, 10-40 mg, Intravenous, Q10 min PRN, Judith Part, MD, 20 mg at 01/28/20 1637 .  magic mouthwash, 15 mL, Oral, QID PRN, Michael Boston, MD .  MEDLINE mouth rinse, 15 mL, Mouth Rinse, q12n4p, Jahmar Mckelvy, Joyice Faster, MD, 15 mL at 01/31/20 1539 .  methocarbamol (ROBAXIN) 1,000 mg in dextrose 5 % 100 mL IVPB, 1,000 mg, Intravenous, Q6H PRN, Michael Boston, MD, Last Rate: 200 mL/hr at 01/23/20 1949, 1,000 mg at 01/23/20 1949 .  metoprolol tartrate (LOPRESSOR) tablet 25 mg, 25 mg, Oral, BID, Judith Part, MD, 25 mg at 01/31/20 2136 .  multivitamin with minerals tablet 1 tablet, 1 tablet, Per Tube, Daily, Judith Part, MD, 1 tablet at 01/31/20 0934 .  ondansetron (ZOFRAN) tablet 4 mg, 4 mg, Per Tube, Q4H PRN **OR** ondansetron (ZOFRAN) injection 4 mg, 4 mg, Intravenous, Q4H PRN, Judith Part, MD, 4 mg at 01/31/20 1204 .  polyethylene glycol (MIRALAX / GLYCOLAX) packet 17 g, 17 g, Per Tube, Daily, Darene Nappi, Joyice Faster, MD, 17 g at 01/30/20 1048 .  polyethylene glycol (MIRALAX / GLYCOLAX) packet 17 g, 17 g, Per Tube, Daily PRN, Belisa Eichholz, Joyice Faster, MD .  promethazine (PHENERGAN) tablet 12.5-25 mg, 12.5-25 mg, Per Tube, Q4H PRN, Judith Part, MD .  simethicone (MYLICON) 40 [id number] suspension 40 mg, 40 mg, Per Tube, QID PRN, Judith Part, MD .  sodium chloride flush (NS) 0.9 % injection 3 mL, 3 mL, Intravenous, Once, White, Nikola Mt, MD .  sodium chloride flush (NS) 0.9 % injection 3 mL, 3 mL, Intravenous, Q12H, Michael Boston, MD, 3 mL at 02/01/20 0422 .  sodium chloride flush (NS) 0.9 % injection 3 mL, 3 mL, Intravenous, PRN, Michael Boston, MD .  traMADol Veatrice Bourbon) tablet 50 mg, 50 mg, Per Tube, Q6H PRN, Judith Part, MD, 50 mg at 01/29/20 2151 .  vitamin B-12 (CYANOCOBALAMIN) tablet 1,000 mcg, 1,000 mcg, Per Tube, Daily, Judith Part, MD, 1,000 mcg at 01/31/20 0934 .  zolpidem (AMBIEN) tablet 5 mg, 5 mg, Per Tube, QHS PRN,MR X 1, Izumi Mixon, Joyice Faster, MD   Physical Exam: AOx3, PERRL, gaze conjugate but horizontal diplopia on testing, +L facial palsy with HB 5, +intact facial sensation in V2-3, dec'd on L in V1, Strength 5/5 x4, SILTx4  Assessment & Plan: 59 y.o. woman w/ large VS and hydrocephalus s/p resection and intra-op EVD placement, recovering well. 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  Neuro -eye gtt OS given incomplete eye closure, tape shut at night to avoid exposure keratophy -No s/sx of HCP, transfer to stepdown  Cardiopulm No issues  FENGI -advanced to dysphagia 2 w/ thins  Heme/ID No issues  Dispo/PPx: -PT/OT/SLP rec CIR, PM&R c/s pending -SCDs/TEDs, Jackey Loge  02/01/20 7:37 AM

## 2020-02-01 NOTE — Progress Notes (Addendum)
Physical Therapy Treatment Patient Details Name: Cristina Conner MRN: [id number] DOB: [birth year] Today's Date: 02/01/2020    History of Present Illness 59 yo female admitted 01/18/20 s/p lap assisted SBR for retained capsule endoscope, Dr. Dema Severin, 5/19. 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 5/27 s/p L craniotomy for tumor resection ventriculostom. pt was experiencing hydrocephalus and L hearing loss. Pt underwent L vestibular schwannoma resection on 5/27 with EVD placement.    PT Comments    Pt is progressing with gait and mobility.  She remains safer with gait with RW, however, we practiced today with hand held assist from therapist and she required up to mod assist during gait as she fatigued.  She took one seated rest break and was unable to go as far as she did with RW and OT earlier today.  She remains appropriate for CIR level therapies at discharge and is motivated to improve.  PT will continue to follow acutely for safe mobility progression.  Follow Up Recommendations  CIR;Supervision/Assistance - 24 hour     Equipment Recommendations  Rolling walker with 5" wheels;3in1 (PT)    Recommendations for Other Services   NA     Precautions / Restrictions Precautions Precautions: Fall Precaution Comments: left leg weakness, dizziness, imbalance, double vision (has corrective glasses)    Mobility  Bed Mobility               General bed mobility comments: Pt was OOB in the recliner chair.   Transfers Overall transfer level: Needs assistance Equipment used: 1 person hand held assist Transfers: Sit to/from Stand Sit to Stand: Min assist         General transfer comment: Heavy min assist to stand from recliner chair x 2 and from commode with rail x 1.  Cues for safe hand placement and safe transitions.   Ambulation/Gait Ambulation/Gait assistance: Mod  assist Gait Distance (Feet): 50 Feet(x2 with seated rest) Assistive device: 1 person hand held assist(and occasional hallway railing. ) Gait Pattern/deviations: Step-through pattern;Staggering left;Staggering right;Decreased dorsiflexion - left;Decreased stance time - left;Decreased step length - left Gait velocity: decreased Gait velocity interpretation: <1.8 ft/sec, indicate of risk for recurrent falls General Gait Details: Pt with interesting festinating gait pattern where she near-freezes, also difficulty initiating first steps to start walking without AD.  Up to mod assist for balance during gait and to help support pt's trunk as L leg progressively got weaker.  Pt very fearful of falling.           Balance Overall balance assessment: Needs assistance Sitting-balance support: Feet supported;No upper extremity supported Sitting balance-Leahy Scale: Fair     Standing balance support: Single extremity supported;Bilateral upper extremity supported Standing balance-Leahy Scale: Poor Standing balance comment: needs external support in standing. Able to support trunk by leaning on sink during hand washing task, but still needed min guard assist as pt's left leg flexed for safety.                            Cognition Arousal/Alertness: Awake/alert Behavior During Therapy: WFL for tasks assessed/performed Overall Cognitive Status: Impaired/Different from baseline Area of Impairment: Memory;Following commands;Problem solving;Attention                   Current Attention Level: Selective Memory: Decreased short-term memory Following Commands: Follows  one step commands consistently;Follows multi-step commands inconsistently Safety/Judgement: Decreased awareness of safety Awareness: Emergent Problem Solving: Slow processing;Requires verbal cues;Difficulty sequencing;Requires tactile cues General Comments: Pt with decreased STM, but generally, cognition improving, high  anxiety when AD taken away, difficulty sequencing safely needing multiple cues throughout session for similar safety cues (stay near railing in the hallway, turn all the way around and back up/reach back before sitting down).               Pertinent Vitals/Pain Pain Assessment: Faces Faces Pain Scale: Hurts little more Pain Location: head Pain Descriptors / Indicators: Aching Pain Intervention(s): Limited activity within patient's tolerance;Monitored during session;Repositioned           PT Goals (current goals can now be found in the care plan section) Acute Rehab PT Goals Patient Stated Goal: feel better, less pain Progress towards PT goals: Progressing toward goals    Frequency    Min 3X/week      PT Plan Current plan remains appropriate       AM-PAC PT "6 Clicks" Mobility   Outcome Measure  Help needed turning from your back to your side while in a flat bed without using bedrails?: A Little Help needed moving from lying on your back to sitting on the side of a flat bed without using bedrails?: A Little Help needed moving to and from a bed to a chair (including a wheelchair)?: A Little Help needed standing up from a chair using your arms (e.g., wheelchair or bedside chair)?: A Little Help needed to walk in hospital room?: A Lot Help needed climbing 3-5 steps with a railing? : A Lot 6 Click Score: 16    End of Session Equipment Utilized During Treatment: Gait belt Activity Tolerance: Patient tolerated treatment well Patient left: in chair;with call bell/phone within reach;Other (comment)(with rehab MD in room) Nurse Communication: Mobility status PT Visit Diagnosis: Difficulty in walking, not elsewhere classified (R26.2);Other abnormalities of gait and mobility (R26.89);Unsteadiness on feet (R26.81)     Time: [id number] PT Time Calculation (min) (ACUTE ONLY): 24 min  Charges:  $Gait Training: 23-37 mins                    Verdene Lennert, PT, DPT  Acute  Rehabilitation [phone] pager #[phone] office     02/01/2020, 2:48 PM

## 2020-02-02 NOTE — Progress Notes (Signed)
Physical Therapy Treatment Patient Details Name: Cristina Conner MRN: [id number] DOB: [birth year] Today's Date: 02/02/2020    History of Present Illness 59 yo female admitted 01/18/20 s/p lap assisted SBR for retained capsule endoscope, Dr. Dema Severin, 5/19. 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 5/27 s/p L craniotomy for tumor resection ventriculostom. pt was experiencing hydrocephalus and L hearing loss. Pt underwent L vestibular schwannoma resection on 5/27 with EVD placement.    PT Comments    PT updates goals as pt has met most goals at this time. Pt demonstrates improved gait quality with use of RW, yet continues to demonstrate festinating gait and reduced foot clearance without use of a device. Pt fatigues quickly with activity and requires multiple brief seated rest breaks during session. Pt also continues to demonstrate increased sway during standing activity and benefits from UE support to maintain balance. Pt will continue to benefit from aggressive mobilization and PT POC to reduce falls risk and to aide in a return to independence. PT continues to recommend CIR at this time due to high falls risk.  Follow Up Recommendations  CIR;Supervision/Assistance - 24 hour     Equipment Recommendations  Rolling walker with 5" wheels;3in1 (PT)    Recommendations for Other Services       Precautions / Restrictions Precautions Precautions: Fall Precaution Comments: left leg weakness, dizziness, imbalance, double vision (has corrective glasses) Restrictions Weight Bearing Restrictions: No    Mobility  Bed Mobility                  Transfers Overall transfer level: Needs assistance Equipment used: Rolling walker (2 wheeled);None Transfers: Sit to/from [employer] to Stand: Supervision Stand pivot transfers: Min assist       General  transfer comment: pt performs sit to stand with RW and in bathroom with use of grab bar with supervision. Pt needing minA to stand pivot transfers due to imbalance  Ambulation/Gait Ambulation/Gait assistance: Min assist;Mod assist Gait Distance (Feet): 80 Feet(40' with RW minA, 31' without device with minA-modA) Assistive device: Rolling walker (2 wheeled);None Gait Pattern/deviations: Step-to pattern;Shuffle;Decreased dorsiflexion - left Gait velocity: reduced Gait velocity interpretation: <1.8 ft/sec, indicate of risk for recurrent falls General Gait Details: pt with L foot drag which increases with fatigue, intermittent festinating gait which also becomes more prevalent with fatigue. Pt with multiple anterior losses of balance   Stairs             Wheelchair Mobility    Modified Rankin (Stroke Patients Only)       Balance Overall balance assessment: Needs assistance Sitting-balance support: No upper extremity supported;Feet supported Sitting balance-Leahy Scale: Good Sitting balance - Comments: supervision for toileting   Standing balance support: No upper extremity supported;During functional activity Standing balance-Leahy Scale: Poor Standing balance comment: minA to wash hands at sink and for static standing balance, increased postural sway                            Cognition Arousal/Alertness: Awake/alert Behavior During Therapy: WFL for tasks assessed/performed Overall Cognitive Status: Impaired/Different from baseline                               Problem Solving: Slow processing  Exercises      General Comments General comments (skin integrity, edema, etc.): tachycardic up to 137 during session      Pertinent Vitals/Pain Pain Assessment: No/denies pain    Home Living                      Prior Function            PT Goals (current goals can now be found in the care plan section) Acute Rehab PT  Goals Patient Stated Goal: feel better, less pain Progress towards PT goals: Progressing toward goals    Frequency    Min 3X/week      PT Plan Current plan remains appropriate    Co-evaluation              AM-PAC PT "6 Clicks" Mobility   Outcome Measure  Help needed turning from your back to your side while in a flat bed without using bedrails?: A Little Help needed moving from lying on your back to sitting on the side of a flat bed without using bedrails?: A Little Help needed moving to and from a bed to a chair (including a wheelchair)?: A Little Help needed standing up from a chair using your arms (e.g., wheelchair or bedside chair)?: A Little Help needed to walk in hospital room?: A Lot Help needed climbing 3-5 steps with a railing? : A Lot 6 Click Score: 16    End of Session Equipment Utilized During Treatment: Gait belt Activity Tolerance: Patient tolerated treatment well Patient left: in chair;with call bell/phone within reach;with chair alarm set Nurse Communication: Mobility status PT Visit Diagnosis: Difficulty in walking, not elsewhere classified (R26.2);Other abnormalities of gait and mobility (R26.89);Unsteadiness on feet (R26.81)     Time: 3912-2583 PT Time Calculation (min) (ACUTE ONLY): 28 min  Charges:  $Gait Training: 8-22 mins $Therapeutic Activity: 8-22 mins                     Zenaida Niece, PT, DPT Acute Rehabilitation Pager: [phone]    Zenaida Niece 02/02/2020, 1:01 PM

## 2020-02-02 NOTE — Progress Notes (Signed)
Neurosurgery Service Progress Note  Subjective: No acute events overnight, drained removed yesterday, no HA/N/V  Objective: Vitals:   02/01/20 2342 02/02/20 0425 02/02/20 0425 02/02/20 0811  BP: 122/73 (!) 134/91  113/79  Pulse: 88 96  90  Resp: 13 16  19   Temp:   98.3 F (36.8 C) 97.6 F (36.4 C)  TempSrc:   Oral Axillary  SpO2:    100%  Weight:      Height:       Temp (24hrs), Avg:98.3 F (36.8 C), Min:97.6 F (36.4 C), Max:98.7 F (37.1 C)  CBC Latest Ref Rng & Units 01/29/2020 01/27/2020 01/26/2020  WBC 4.0 - 10.5 K/uL 9.7 9.3 15.5(H)  Hemoglobin 12.0 - 15.0 g/dL 7.7(L) 7.5(L) 9.6(L)  Hematocrit 36.0 - 46.0 % 24.9(L) 24.9(L) 31.2(L)  Platelets 150 - 400 K/uL 309 322 419(H)   BMP Latest Ref Rng & Units 01/27/2020 01/26/2020 01/24/2020  Glucose 70 - 99 mg/dL - 133(H) -  BUN 6 - 20 mg/dL - 13 -  Creatinine 0.44 - 1.00 mg/dL 0.78 0.84 0.92  BUN/Creat Ratio 9 - 23 - - -  Sodium 135 - 145 mmol/L - 140 -  Potassium 3.5 - 5.1 mmol/L - 3.9 -  Chloride 98 - 111 mmol/L - 106 -  CO2 22 - 32 mmol/L - 24 -  Calcium 8.9 - 10.3 mg/dL - 9.4 -   No intake or output data in the 24 hours ending 02/02/20 0909  Current Facility-Administered Medications:  .  0.9 %  sodium chloride infusion, 250 mL, Intravenous, PRN, Michael Boston, MD, Last Rate: 10 mL/hr at 01/27/20 2355, 250 mL at 01/27/20 2355 .  acetaminophen (TYLENOL) tablet 650 mg, 650 mg, Per Tube, Q4H PRN **OR** acetaminophen (TYLENOL) suppository 650 mg, 650 mg, Rectal, Q4H PRN, Judith Part, MD .  acetaminophen (TYLENOL) tablet 1,000 mg, 1,000 mg, Oral, Q6H, Maribelle Hopple A, MD, 1,000 mg at 02/02/20 0530 .  alum & mag hydroxide-simeth (MAALOX/MYLANTA) 200-200-20 MG/5ML suspension 30 mL, 30 mL, Per Tube, Q6H PRN, Judith Part, MD .  bisacodyl (DULCOLAX) suppository 10 mg, 10 mg, Rectal, Q12H PRN, Michael Boston, MD, 10 mg at 01/22/20 1147 .  busPIRone (BUSPAR) tablet 5 mg, 5 mg, Oral, TID, Judith Part, MD, 5  mg at 02/02/20 0854 .  chlorhexidine (PERIDEX) 0.12 % solution 15 mL, 15 mL, Mouth Rinse, BID, Leota Maka, Joyice Faster, MD, 15 mL at 02/02/20 0852 .  Chlorhexidine Gluconate Cloth 2 % PADS 6 each, 6 each, Topical, Daily, Kipp Brood, MD, 6 each at 02/02/20 0855 .  dicyclomine (BENTYL) capsule 10 mg, 10 mg, Per Tube, TID AC, Judith Part, MD, 10 mg at 02/02/20 0854 .  diphenhydrAMINE (BENADRYL) capsule 25 mg, 25 mg, Oral, Q6H PRN **OR** diphenhydrAMINE (BENADRYL) injection 25 mg, 25 mg, Intravenous, Q6H PRN, Ileana Roup, MD .  docusate (COLACE) 50 MG/5ML liquid 100 mg, 100 mg, Per Tube, BID, Judith Part, MD, 100 mg at 02/01/20 1006 .  famotidine (PEPCID) tablet 20 mg, 20 mg, Oral, BID, Judith Part, MD, 20 mg at 02/02/20 0853 .  feeding supplement (ENSURE ENLIVE) (ENSURE ENLIVE) liquid 237 mL, 237 mL, Oral, BID BM, Judith Part, MD, 237 mL at 02/02/20 0855 .  FLUoxetine (PROZAC) capsule 10 mg, 10 mg, Per Tube, Daily, Judith Part, MD, 10 mg at 02/02/20 0854 .  folic acid (FOLVITE) tablet 1 mg, 1 mg, Per Tube, Daily, Garron Eline, Joyice Faster, MD, 1 mg at 02/02/20 0855 .  heparin injection 5,000 Units, 5,000 Units, Subcutaneous, Q8H, Margaretann Abate, Joyice Faster, MD, 5,000 Units at 02/02/20 0533 .  hydrALAZINE (APRESOLINE) tablet 25 mg, 25 mg, Per Tube, Q4H PRN, Judith Part, MD .  HYDROcodone-acetaminophen (NORCO/VICODIN) 5-325 MG per tablet 1 tablet, 1 tablet, Per Tube, Q4H PRN, Judith Part, MD, 1 tablet at 01/31/20 0451 .  HYDROmorphone (DILAUDID) injection 0.5 mg, 0.5 mg, Intravenous, Q3H PRN, Judith Part, MD, 0.5 mg at 01/28/20 0819 .  hydroxypropyl methylcellulose / hypromellose (ISOPTO TEARS / GONIOVISC) 2.5 % ophthalmic solution 1 drop, 1 drop, Left Eye, Q2H, Hayzlee Mcsorley, Joyice Faster, MD, 1 drop at 02/02/20 0850 .  ibuprofen (ADVIL) tablet 600 mg, 600 mg, Oral, Q6H PRN, Judith Part, MD, 600 mg at 02/01/20 1847 .  iron polysaccharides  (NIFEREX) capsule 150 mg, 150 mg, Per Tube, Daily, Judith Part, MD, 150 mg at 02/02/20 0853 .  labetalol (NORMODYNE) injection 10-40 mg, 10-40 mg, Intravenous, Q10 min PRN, Judith Part, MD, 20 mg at 01/28/20 1637 .  magic mouthwash, 15 mL, Oral, QID PRN, Michael Boston, MD .  MEDLINE mouth rinse, 15 mL, Mouth Rinse, q12n4p, Mahsa Hanser, Joyice Faster, MD, 15 mL at 02/01/20 1150 .  methocarbamol (ROBAXIN) 1,000 mg in dextrose 5 % 100 mL IVPB, 1,000 mg, Intravenous, Q6H PRN, Michael Boston, MD, Last Rate: 200 mL/hr at 01/23/20 1949, 1,000 mg at 01/23/20 1949 .  metoprolol tartrate (LOPRESSOR) tablet 25 mg, 25 mg, Oral, BID, Judith Part, MD, 25 mg at 02/02/20 0853 .  multivitamin with minerals tablet 1 tablet, 1 tablet, Per Tube, Daily, Judith Part, MD, 1 tablet at 02/02/20 0853 .  ondansetron (ZOFRAN) tablet 4 mg, 4 mg, Per Tube, Q4H PRN **OR** ondansetron (ZOFRAN) injection 4 mg, 4 mg, Intravenous, Q4H PRN, Judith Part, MD, 4 mg at 01/31/20 1204 .  polyethylene glycol (MIRALAX / GLYCOLAX) packet 17 g, 17 g, Per Tube, Daily, Sabella Traore, Joyice Faster, MD, 17 g at 02/01/20 1007 .  polyethylene glycol (MIRALAX / GLYCOLAX) packet 17 g, 17 g, Per Tube, Daily PRN, Rielynn Trulson, Joyice Faster, MD .  promethazine (PHENERGAN) tablet 12.5-25 mg, 12.5-25 mg, Per Tube, Q4H PRN, Judith Part, MD .  simethicone (MYLICON) 40 [id number] suspension 40 mg, 40 mg, Per Tube, QID PRN, Judith Part, MD .  sodium chloride flush (NS) 0.9 % injection 3 mL, 3 mL, Intravenous, Once, White, [place], MD .  sodium chloride flush (NS) 0.9 % injection 3 mL, 3 mL, Intravenous, Q12H, Michael Boston, MD, 3 mL at 02/02/20 0856 .  sodium chloride flush (NS) 0.9 % injection 3 mL, 3 mL, Intravenous, PRN, Michael Boston, MD .  traMADol Veatrice Bourbon) tablet 50 mg, 50 mg, Per Tube, Q6H PRN, Judith Part, MD, 50 mg at 01/29/20 2151 .  vitamin B-12 (CYANOCOBALAMIN) tablet 1,000 mcg, 1,000 mcg, Per Tube, Daily,  Judith Part, MD, 1,000 mcg at 02/02/20 0856 .  zolpidem (AMBIEN) tablet 5 mg, 5 mg, Per Tube, QHS PRN,MR X 1, Tahisha Hakim, Joyice Faster, MD   Physical Exam: AOx3, PERRL, gaze conjugate but horizontal diplopia on testing, +L facial palsy with HB 5, +intact facial sensation in V2-3, dec'd on L in V1, Strength 5/5 x4, SILTx4  Assessment & Plan: 59 y.o. woman w/ large VS and hydrocephalus s/p resection and intra-op EVD placement, recovering well. 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  Neuro -eye gtt OS given incomplete eye closure, tape shut at night to avoid exposure keratophy  Cardiopulm No issues  FENGI -advanced to dysphagia 2 w/ thins  Heme/ID No issues  Dispo/PPx: -PT/OT/SLP rec CIR, CIR pending -SCDs/TEDs, SQH  Judith Part  02/02/20 9:09 AM

## 2020-02-03 ENCOUNTER — Inpatient Hospital Stay (HOSPITAL_COMMUNITY): Payer: BC Managed Care – PPO

## 2020-02-03 ENCOUNTER — Inpatient Hospital Stay (HOSPITAL_COMMUNITY)
Admission: RE | Admit: 2020-02-03 | Discharge: 2020-02-12 | DRG: 092 | Disposition: A | Discharge status: Home / Self Care | Payer: BC Managed Care – PPO | Source: Intra-hospital | Attending: Physical Medicine & Rehabilitation | Admitting: Physical Medicine & Rehabilitation

## 2020-02-03 ENCOUNTER — Encounter (HOSPITAL_COMMUNITY): Payer: Self-pay | Admitting: Physical Medicine & Rehabilitation

## 2020-02-03 ENCOUNTER — Other Ambulatory Visit: Payer: Self-pay

## 2020-02-03 DIAGNOSIS — R29810 Facial weakness: Secondary | ICD-10-CM | POA: Diagnosis present

## 2020-02-03 DIAGNOSIS — K219 Gastro-esophageal reflux disease without esophagitis: Secondary | ICD-10-CM | POA: Diagnosis present

## 2020-02-03 DIAGNOSIS — D333 Benign neoplasm of cranial nerves: Secondary | ICD-10-CM | POA: Diagnosis present

## 2020-02-03 DIAGNOSIS — E46 Unspecified protein-calorie malnutrition: Secondary | ICD-10-CM | POA: Diagnosis present

## 2020-02-03 DIAGNOSIS — H532 Diplopia: Secondary | ICD-10-CM | POA: Diagnosis present

## 2020-02-03 DIAGNOSIS — Z833 Family history of diabetes mellitus: Secondary | ICD-10-CM | POA: Diagnosis not present

## 2020-02-03 DIAGNOSIS — H02402 Unspecified ptosis of left eyelid: Secondary | ICD-10-CM | POA: Diagnosis present

## 2020-02-03 DIAGNOSIS — R000 Tachycardia, unspecified: Secondary | ICD-10-CM

## 2020-02-03 DIAGNOSIS — E8809 Other disorders of plasma-protein metabolism, not elsewhere classified: Secondary | ICD-10-CM | POA: Diagnosis present

## 2020-02-03 DIAGNOSIS — F329 Major depressive disorder, single episode, unspecified: Secondary | ICD-10-CM | POA: Diagnosis present

## 2020-02-03 DIAGNOSIS — Z96642 Presence of left artificial hip joint: Secondary | ICD-10-CM | POA: Diagnosis present

## 2020-02-03 DIAGNOSIS — H9192 Unspecified hearing loss, left ear: Secondary | ICD-10-CM | POA: Diagnosis present

## 2020-02-03 DIAGNOSIS — I10 Essential (primary) hypertension: Secondary | ICD-10-CM | POA: Diagnosis present

## 2020-02-03 DIAGNOSIS — N951 Menopausal and female climacteric states: Secondary | ICD-10-CM

## 2020-02-03 DIAGNOSIS — F419 Anxiety disorder, unspecified: Secondary | ICD-10-CM | POA: Diagnosis present

## 2020-02-03 DIAGNOSIS — R1310 Dysphagia, unspecified: Secondary | ICD-10-CM | POA: Diagnosis present

## 2020-02-03 DIAGNOSIS — D62 Acute posthemorrhagic anemia: Secondary | ICD-10-CM | POA: Diagnosis present

## 2020-02-03 DIAGNOSIS — G3184 Mild cognitive impairment, so stated: Secondary | ICD-10-CM | POA: Diagnosis present

## 2020-02-03 DIAGNOSIS — R471 Dysarthria and anarthria: Secondary | ICD-10-CM | POA: Diagnosis present

## 2020-02-03 DIAGNOSIS — Z8249 Family history of ischemic heart disease and other diseases of the circulatory system: Secondary | ICD-10-CM

## 2020-02-03 DIAGNOSIS — M25552 Pain in left hip: Secondary | ICD-10-CM | POA: Diagnosis present

## 2020-02-03 DIAGNOSIS — G8918 Other acute postprocedural pain: Secondary | ICD-10-CM

## 2020-02-03 DIAGNOSIS — R52 Pain, unspecified: Secondary | ICD-10-CM

## 2020-02-03 DIAGNOSIS — G510 Bell's palsy: Secondary | ICD-10-CM | POA: Diagnosis not present

## 2020-02-03 DIAGNOSIS — R2689 Other abnormalities of gait and mobility: Secondary | ICD-10-CM | POA: Diagnosis present

## 2020-02-03 LAB — SURGICAL PATHOLOGY

## 2020-02-03 MED ORDER — HYPROMELLOSE (GONIOSCOPIC) 2.5 % OP SOLN: 1.0000 [drp] | OPHTHALMIC | 12 refills | Status: DC

## 2020-02-03 MED ORDER — POLYVINYL ALCOHOL 1.4 % OP SOLN
1.0000 [drp] | OPHTHALMIC | Status: DC
  Administered 2020-02-03 – 2020-02-12 (×65): 1 [drp] via OPHTHALMIC
  Filled 2020-02-03: qty 15

## 2020-02-03 MED ORDER — HEPARIN SODIUM (PORCINE) 5000 UNIT/ML IJ SOLN
5000.0000 [IU] | Freq: Three times a day (TID) | INTRAMUSCULAR | Status: DC
  Administered 2020-02-05: 5000 [IU] via SUBCUTANEOUS
  Filled 2020-02-03 (×7): qty 1

## 2020-02-03 MED ORDER — ACETAMINOPHEN 325 MG PO TABS
325.0000 mg | ORAL_TABLET | ORAL | Status: DC | PRN
  Filled 2020-02-03: qty 2

## 2020-02-03 MED ORDER — GUAIFENESIN-DM 100-10 MG/5ML PO SYRP: 5.0000 mL | ORAL_SOLUTION | Freq: Four times a day (QID) | ORAL | Status: DC | PRN

## 2020-02-03 MED ORDER — METOPROLOL TARTRATE 25 MG PO TABS
25.0000 mg | ORAL_TABLET | Freq: Two times a day (BID) | ORAL | Status: DC
  Administered 2020-02-03 – 2020-02-12 (×18): 25 mg via ORAL
  Filled 2020-02-03 (×18): qty 1

## 2020-02-03 MED ORDER — SIMETHICONE 40 MG/0.6ML PO SUSP
40.0000 mg | Freq: Four times a day (QID) | ORAL | Status: DC | PRN
  Filled 2020-02-03: qty 0.6

## 2020-02-03 MED ORDER — ACETAMINOPHEN 325 MG PO TABS
650.0000 mg | ORAL_TABLET | Freq: Four times a day (QID) | ORAL | Status: DC
  Administered 2020-02-03 – 2020-02-12 (×33): 650 mg via ORAL
  Filled 2020-02-03 (×32): qty 2

## 2020-02-03 MED ORDER — DICYCLOMINE HCL 10 MG PO CAPS
10.0000 mg | ORAL_CAPSULE | Freq: Three times a day (TID) | ORAL | Status: DC
  Administered 2020-02-04 – 2020-02-12 (×23): 10 mg via ORAL
  Filled 2020-02-03 (×26): qty 1

## 2020-02-03 MED ORDER — FLEET ENEMA 7-19 GM/118ML RE ENEM: 1.0000 | ENEMA | Freq: Once | RECTAL | Status: DC | PRN

## 2020-02-03 MED ORDER — FLUOXETINE HCL 10 MG PO CAPS
10.0000 mg | ORAL_CAPSULE | Freq: Every day | ORAL | Status: DC
  Administered 2020-02-04 – 2020-02-12 (×9): 10 mg via ORAL
  Filled 2020-02-03 (×9): qty 1

## 2020-02-03 MED ORDER — BISACODYL 10 MG RE SUPP: 10.0000 mg | Freq: Every day | RECTAL | Status: DC | PRN

## 2020-02-03 MED ORDER — FOLIC ACID 1 MG PO TABS
1.0000 mg | ORAL_TABLET | Freq: Every day | ORAL | Status: DC
  Administered 2020-02-04 – 2020-02-12 (×9): 1 mg via ORAL
  Filled 2020-02-03 (×9): qty 1

## 2020-02-03 MED ORDER — POLYSACCHARIDE IRON COMPLEX 150 MG PO CAPS
150.0000 mg | ORAL_CAPSULE | Freq: Every day | ORAL | Status: DC
  Administered 2020-02-04 – 2020-02-12 (×9): 150 mg via ORAL
  Filled 2020-02-03 (×9): qty 1

## 2020-02-03 MED ORDER — ALUM & MAG HYDROXIDE-SIMETH 200-200-20 MG/5ML PO SUSP: 30.0000 mL | ORAL | Status: DC | PRN

## 2020-02-03 MED ORDER — ADULT MULTIVITAMIN W/MINERALS CH
1.0000 | ORAL_TABLET | Freq: Every day | ORAL | Status: DC
  Administered 2020-02-04 – 2020-02-12 (×9): 1 via ORAL
  Filled 2020-02-03 (×9): qty 1

## 2020-02-03 MED ORDER — POLYETHYLENE GLYCOL 3350 17 G PO PACK
17.0000 g | PACK | Freq: Every day | ORAL | Status: DC | PRN
  Administered 2020-02-06: 17 g via ORAL
  Filled 2020-02-03 (×2): qty 1

## 2020-02-03 MED ORDER — MAGIC MOUTHWASH
15.0000 mL | Freq: Four times a day (QID) | ORAL | Status: DC | PRN
  Filled 2020-02-03: qty 15

## 2020-02-03 MED ORDER — DIPHENHYDRAMINE HCL 12.5 MG/5ML PO ELIX: 12.5000 mg | ORAL_SOLUTION | Freq: Four times a day (QID) | ORAL | Status: DC | PRN

## 2020-02-03 MED ORDER — HYPROMELLOSE (GONIOSCOPIC) 2.5 % OP SOLN
1.0000 [drp] | OPHTHALMIC | Status: DC
  Filled 2020-02-03: qty 15

## 2020-02-03 MED ORDER — VITAMIN B-12 1000 MCG PO TABS
1000.0000 ug | ORAL_TABLET | Freq: Every day | ORAL | Status: DC
  Administered 2020-02-04 – 2020-02-12 (×9): 1000 ug via ORAL
  Filled 2020-02-03 (×9): qty 1

## 2020-02-03 MED ORDER — FAMOTIDINE 20 MG PO TABS
20.0000 mg | ORAL_TABLET | Freq: Two times a day (BID) | ORAL | Status: DC
  Administered 2020-02-03 – 2020-02-12 (×18): 20 mg via ORAL
  Filled 2020-02-03 (×18): qty 1

## 2020-02-03 MED ORDER — DOCUSATE SODIUM 100 MG PO CAPS
100.0000 mg | ORAL_CAPSULE | Freq: Two times a day (BID) | ORAL | Status: DC
  Administered 2020-02-03 – 2020-02-12 (×18): 100 mg via ORAL
  Filled 2020-02-03 (×18): qty 1

## 2020-02-03 MED ORDER — PROCHLORPERAZINE EDISYLATE 10 MG/2ML IJ SOLN: 5.0000 mg | Freq: Four times a day (QID) | INTRAMUSCULAR | Status: DC | PRN

## 2020-02-03 MED ORDER — PROCHLORPERAZINE 25 MG RE SUPP: 12.5000 mg | Freq: Four times a day (QID) | RECTAL | Status: DC | PRN

## 2020-02-03 MED ORDER — HYDROCODONE-ACETAMINOPHEN 5-325 MG PO TABS
1.0000 | ORAL_TABLET | ORAL | Status: DC | PRN
  Administered 2020-02-05: 1
  Filled 2020-02-03 (×2): qty 1

## 2020-02-03 MED ORDER — TRAMADOL HCL 50 MG PO TABS
50.0000 mg | ORAL_TABLET | Freq: Four times a day (QID) | ORAL | Status: DC | PRN
  Administered 2020-02-04 – 2020-02-10 (×2): 50 mg
  Filled 2020-02-03 (×2): qty 1

## 2020-02-03 MED ORDER — PROCHLORPERAZINE MALEATE 5 MG PO TABS: 5.0000 mg | ORAL_TABLET | Freq: Four times a day (QID) | ORAL | Status: DC | PRN

## 2020-02-03 MED ORDER — TRAZODONE HCL 50 MG PO TABS
25.0000 mg | ORAL_TABLET | Freq: Every evening | ORAL | Status: DC | PRN
  Administered 2020-02-10: 50 mg via ORAL
  Filled 2020-02-03: qty 1

## 2020-02-03 MED ORDER — BUSPIRONE HCL 5 MG PO TABS
5.0000 mg | ORAL_TABLET | Freq: Three times a day (TID) | ORAL | Status: DC
  Administered 2020-02-03 – 2020-02-12 (×26): 5 mg via ORAL
  Filled 2020-02-03 (×26): qty 1

## 2020-02-03 NOTE — Progress Notes (Signed)
Occupational Therapy Treatment Patient Details Name: Cristina Conner MRN: [id number] DOB: 15-Jun-1961 Today's Date: 02/03/2020    History of present illness 59 yo female admitted 01/18/20 s/p lap assisted SBR for retained capsule endoscope, Dr. Dema Severin, 5/19. 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 5/27 s/p L craniotomy for tumor resection ventriculostom. pt was experiencing hydrocephalus and L hearing loss. Pt underwent L vestibular schwannoma resection on 5/27 with EVD placement.   OT comments  Patient continues to make steady progress towards goals in skilled OT session. Patient's session encompassed promotion of increased activity tolerance to complete ADLs. Pt continues to progress, but was limited by HR and fatigue in session, requiring 1 seated rest break due to HR getting into the 130s when completing static ADL task at the sink. Pt was symptomatic, but education was provided to take note of her body when completing tasks for energy conservation's sake. Pt unwilling to attempt HHA to date, stating she is much more comfortable with the walker. Pt would continue to benefit greatly from intensive rehab in order to progress toward PLOF; will continue to follow acutely.    Follow Up Recommendations  CIR    Equipment Recommendations  None recommended by OT    Recommendations for Other Services      Precautions / Restrictions Precautions Precautions: Fall Precaution Comments: left leg weakness, dizziness, imbalance, double vision (has corrective glasses) Restrictions Weight Bearing Restrictions: No       Mobility Bed Mobility Overal bed mobility: Needs Assistance Bed Mobility: Supine to Sit Rolling: Min guard            Transfers Overall transfer level: Needs assistance Equipment used: Rolling walker (2 wheeled) Transfers: Sit to/from Stand Sit to Stand:  Min guard         General transfer comment: pt abvle to perform sit <>stand transfers x4, requiring cues to use grab bar in bathroom and to power up from chair to date    Balance Overall balance assessment: Needs assistance Sitting-balance support: No upper extremity supported;Feet supported Sitting balance-Leahy Scale: Good Sitting balance - Comments: supervision for toileting   Standing balance support: Bilateral upper extremity supported;During functional activity Standing balance-Leahy Scale: Poor Standing balance comment: reliant on external support, much more comfortable using RW vs HHA per pt's admission                           ADL either performed or assessed with clinical judgement   ADL Overall ADL's : Needs assistance/impaired     Grooming: Wash/dry hands;Wash/dry face;Oral care;Brushing hair;Min guard;Standing Grooming Details (indicate cue type and reason): Improved sequencing to date, however required seated rest break to complete         Upper Body Dressing : Moderate assistance;Sitting       Toilet Transfer: Min guard;Ambulation;Comfort height toilet;RW;Grab bars;Cueing for sequencing Toilet Transfer Details (indicate cue type and reason): Cues to use grab bars to descend instead of walker Toileting- Clothing Manipulation and Hygiene: Minimal assistance Toileting - Clothing Manipulation Details (indicate cue type and reason): Assisting with gown     Functional mobility during ADLs: Min guard;Rolling walker General ADL Comments: Pt continues to make progress, limited this session due to [place]  Cognition Arousal/Alertness: Awake/alert Behavior During Therapy: WFL for tasks assessed/performed Overall Cognitive Status: Impaired/Different from baseline Area of Impairment: Memory;Following commands;Problem solving;Attention                   Current Attention Level: Selective Memory:  Decreased short-term memory Following Commands: Follows one step commands consistently;Follows multi-step commands inconsistently Safety/Judgement: Decreased awareness of safety Awareness: Emergent Problem Solving: Slow processing General Comments: Pt demonstrates minimal STM deficits, therapist exited the room as SLP was present for a moment, therapist had introduced self a few minutes prior, therapist re-entered and pt stated "now who are you again?' not recalling introduction        Exercises     Shoulder Instructions       General Comments      Pertinent Vitals/ Pain       Pain Assessment: Faces Faces Pain Scale: Hurts a little bit Pain Location: LLE Pain Descriptors / Indicators: Discomfort Pain Intervention(s): Limited activity within patient's tolerance;Monitored during session;Repositioned  Home Living                                          Prior Functioning/Environment              Frequency  Min 2X/week        Progress Toward Goals  OT Goals(current goals can now be found in the care plan section)  Progress towards OT goals: Progressing toward goals  Acute Rehab OT Goals Patient Stated Goal: to go to rehab OT Goal Formulation: With patient/family Time For Goal Achievement: 02/11/20 Potential to Achieve Goals: Good ADL Goals Pt Will Perform Grooming: with supervision;standing Pt Will Perform Upper Body Bathing: with supervision;standing Pt Will Perform Lower Body Bathing: with supervision;sit to/from stand Pt Will Perform Upper Body Dressing: with set-up;sitting Pt Will Perform Lower Body Dressing: with supervision;sit to/from stand Pt Will Transfer to Toilet: with supervision;ambulating;regular height toilet;grab bars Pt Will Perform Toileting - Clothing Manipulation and hygiene: with supervision;sit to/from stand Pt Will Perform Tub/Shower Transfer: Tub transfer;Shower transfer;ambulating;shower seat;tub bench;rolling  walker;with min guard assist Additional ADL Goal #1: Pt will be able to divide attention during familiar ADL tasks with no more than min cues Additional ADL Goal #2: Pt will be independent with fusion exercises for vision  Plan Discharge plan remains appropriate    Co-evaluation                 AM-PAC OT "6 Clicks" Daily Activity     Outcome Measure   Help from another person eating meals?: A Little Help from another person taking care of personal grooming?: A Little Help from another person toileting, which includes using toliet, bedpan, or urinal?: A Little Help from another person bathing (including washing, rinsing, drying)?: A Little Help from another person to put on and taking off regular upper body clothing?: A Little Help from another person to put on and taking off regular lower body clothing?: A Little 6 Click Score: 18    End of Session Equipment Utilized During Treatment: Gait belt;Rolling walker  OT Visit Diagnosis: Unsteadiness on feet (R26.81);Muscle weakness (generalized) (M62.81)   Activity Tolerance Patient tolerated treatment well   Patient Left in bed;with call bell/phone within reach;with bed alarm set   Nurse Communication Mobility status        Time: [id number] OT Time Calculation (min): 21 min  Charges: OT General Charges $  OT Visit: 1 Visit OT Treatments $Self Care/Home Management : 8-22 mins  Corinne Ports E. Emmelina Mcloughlin, COTA/L Acute Rehabilitation Services [place] 02/03/2020, 10:29 AM

## 2020-02-03 NOTE — Progress Notes (Signed)
Cristina Heys, MD  Physician  Physical Medicine and Rehabilitation  Consult Note     Signed  Date of Service:  01/31/2020  2:32 PM      Related encounter: ED to Hosp-Admission (Current) from 01/18/2020 in [place] [facility]   Show:Clear all [x] Manual[x] Template[] Copied  Added by: [x] Love, Ivan Anchors, PA-C[x] Cristina Heys, MD  [] Hover for details          Physical Medicine and Rehabilitation Consult     Reason for Consult: Functional deficits  Referring Physician: Dr.      HPI: Cristina Conner is a 59 y.o. female with history of N/V, anxiety d/o, L-THR 12/20, recent small bowel inflammatory changes with capsule endoscopy which unfortunately did not pass thorough. Follow up CT abdomen showed resolution of inflammatory changes but patient continued to have intermittent N/V with constipation, weakness and partial SB obstruction. Due to concerns of stricutre or adhesive disease, she was admitted on 01/18/20 for hydration and  and underwent laparoscopy with lap SB resection and capsule removal on 05/19 by Dr.  Dema Severin.  Post op, hospitalist consulted to assist with management of tachycardia and elevated BP.  She was also found to have wide based, festinating type gait intermittently tendency for LOB during PT.    CT head done revealing moderate hydrocephalus with mass effect on left side of 4th ventricle with displacement to the right and possible mass lesion in left posterior fossa displacing brachium pontis. Dr. Annette Stable consulted for input and she was transferred to [facility] for further work up. Patient reported having progressive HA for 2 months as well as some decrease in hearing on the left.  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  She underwent left retrosigmoid craniotomy for resection of CP angle  mass with placement of left occipital ventriculostomy placement by Dr. Zada Finders on 05/27. EVD removed on 5/31 as no longer working and follow up CT head showed improvement in hydrocephalus. She continues to be limited by significant vestibular symptoms, Left foot weakness with unsteady gait, bouts of lethargy, STM deficits with problems processing and dysphagia. CIR recommended due to functional decline.     Today--denies dizziness. Took a shower independently and has been walking in the room per staff.     Pt walked with PT just prior to me seeing pt- found in hallway with PT- and was requiring Mod A to walk without AD- min A when RW was used.  Per PT, pt body kept going but her feet would stop.  Took miralax this AM and had 4 BMs today- loose, but not watery.    Review of Systems  Constitutional: Negative for chills and fever.  HENT: Positive for hearing loss (loss of hearing on the left). Negative for tinnitus.   Eyes: Positive for double vision.       Very poor vision in left eye--no vision v/s double vision.    Respiratory: Negative for cough, hemoptysis, shortness of breath and stridor.   Cardiovascular: Negative for chest pain, palpitations and leg swelling.  Gastrointestinal: Negative for abdominal pain, heartburn and nausea.  Musculoskeletal: Negative for back pain and joint pain.  Skin: Negative for itching and rash.  Neurological: Positive for dizziness. Negative for headaches.  Psychiatric/Behavioral: The patient is not nervous/anxious.   All other systems reviewed and are negative.  Past Medical History:  Diagnosis Date  . Anxiety    . Arthritis    . Diverticulosis    . GERD (gastroesophageal reflux disease)    . Hypertension    . Osteopenia    . Post-operative nausea and vomiting      vomiting after general anesthesia per pt.  . Pre-diabetes             Past Surgical History:  Procedure Laterality Date  . CESAREAN SECTION        x 2  . COLONOSCOPY        . ENDOMETRIAL ABLATION      . ESOPHAGOGASTRODUODENOSCOPY   10/2019  . LAPAROSCOPY N/A 01/19/2020    Procedure: LAPAROSCOPY DIAGNOSTIC LAPAROTOMY WITH SMALL BOWEL RESECTION;  Surgeon: Ileana Roup, MD;  Location: [facility];  Service: General;  Laterality: N/A;  . PELVIC LAPAROSCOPY   1999    left salpingectomy, excision of Right, paratubal cyst  . PELVIC LAPAROSCOPY        laparoscopy with lysis of pelvic adhesions  . SHOULDER SURGERY   11/2010    "FROZEN SHOULDER"  . TOTAL HIP ARTHROPLASTY Left 08/17/2019    Procedure: LEFT TOTAL HIP ARTHROPLASTY ANTERIOR APPROACH;  Surgeon: Melrose Nakayama, MD;  Location: [facility];  Service: Orthopedics;  Laterality: Left;  . TUBAL LIGATION      . UPPER GASTROINTESTINAL ENDOSCOPY               Family History  Problem Relation Age of Onset  . Hypertension Mother    . Heart disease Mother    . Diabetes Mother    . Hypertension Father    . Colon cancer Neg Hx    . Esophageal cancer Neg Hx    . Stomach cancer Neg Hx    . Rectal cancer Neg Hx    . Colon polyps Neg Hx        Social History:  Widowed last July--has been sedentary for the past year. 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 Daughter from [place] has moved in to help post surgery. She reports that she has never smoked. She has never used smokeless tobacco. She reports previous alcohol use. She reports that she does not use drugs.         Allergies  Allergen Reactions  . Hydrocodone Nausea And Vomiting            Medications Prior to Admission  Medication Sig Dispense Refill  . acetaminophen (TYLENOL) 500 MG tablet Take 1,000 mg by mouth as needed for moderate pain.      Marland Kitchen dicyclomine (BENTYL) 10 MG capsule Take 1 capsule (10 mg total) by mouth 3 (three) times daily before meals. 90 capsule 3  . eszopiclone (LUNESTA) 1 MG TABS tablet Take 1 mg by mouth at bedtime as needed for sleep.       . famotidine (PEPCID) 40 MG tablet Take 40 mg by mouth daily.        . Fe Fum-FePoly-Vit C-Vit B3 (INTEGRA) 62.5-62.5-40-3 MG CAPS Take 62.5 mg by mouth daily. 30 capsule 6  . FLUoxetine (PROZAC) 10 MG tablet Take 1 tablet (10 mg total) by mouth daily. 90 tablet 0  . ondansetron (ZOFRAN) 4 MG tablet Take 1 tablet (4 mg total) by mouth every 6 (six) hours as needed for nausea or vomiting. 50 tablet 4  . mirtazapine (REMERON) 7.5 MG tablet Take 1 tablet (7.5 mg total) by mouth at bedtime. (Patient  not taking: Reported on 01/18/2020) 30 tablet 1  . ondansetron (ZOFRAN-ODT) 8 MG disintegrating tablet Take 1 tablet (8 mg total) by mouth every 8 (eight) hours as needed for nausea or refractory nausea / vomiting. (Patient not taking: Reported on 01/18/2020) 20 tablet 0      Home: [place] expects to be discharged to:: Private residence Living Arrangements: Alone Available Help at Discharge: Family Type of Home: House Home Access: Stairs to enter Technical brewer of Steps: 2 [place]: One level Bathroom Shower/Tub: Multimedia programmer: [place]: Environmental consultant - 2 wheels, Bedside commode Additional Comments: daughter will help when she goes home. Daughter is speech therapist. Pt likes to Zumba  Functional History: Prior Function Level of Independence: Independent Comments: dtr states she is independent Functional Status:  Mobility: Bed Mobility Overal bed mobility: Needs Assistance Bed Mobility: Supine to Sit, Sit to Supine Rolling: Min guard Sidelying to sit: Min guard, HOB elevated(at about 30 deg) Supine to sit: Min guard, HOB elevated Sit to supine: Min guard Sit to sidelying: Min guard General bed mobility comments: increased time but able to transfer without external assist, HOB very elevated Transfers Overall transfer level: Needs assistance Equipment used: Rolling walker (2 wheeled) Transfers: Sit to/from Stand Sit to Stand: Mod assist Stand pivot transfers: Max assist, +2 physical assistance(RN  to manage lines) General transfer comment: deferred OOB due to pt with c/o nausea and lightheadedness Ambulation/Gait Ambulation/Gait assistance: Mod assist, +2 safety/equipment Gait Distance (Feet): 75 Feet Assistive device: Rolling walker (2 wheeled) Gait Pattern/deviations: Step-through pattern, Decreased stride length, Drifts right/left, Narrow base of support General Gait Details: pt with improved step height/foot clearance with use of walker compared to yesterday. with onset of fatigue at 59' pt with L LE fatigue and increased difficulty clearing foot, pt stating "my left foot is dragging." Pt remains to have c/o of lightheadedness/dizziness, suspect it could be due to movement in general as it was a vestibular schwanoma, modA for walker management and to walk in a straight line, pt with narrow base of support and tencency to hover towards the R side of the walker Gait velocity: slow Gait velocity interpretation: <1.8 ft/sec, indicate of risk for recurrent falls   ADL: ADL Overall ADL's : Needs assistance/impaired Grooming: Wash/dry face, Moderate assistance, Bed level Upper Body Bathing: Maximal assistance Lower Body Bathing: Total assistance Upper Body Dressing : Maximal assistance Lower Body Dressing: Total assistance Toilet Transfer Details (indicate cue type and reason): unable to attempt this session due to nausea General ADL Comments: pt progresed to EOB and required return to supine due to nauseated and RN giving medication   Cognition: Cognition Overall Cognitive Status: Within Functional Limits for tasks assessed Arousal/Alertness: (drowsy) Orientation Level: Oriented X4 Attention: Sustained Sustained Attention: Impaired Sustained Attention Impairment: Functional basic Memory: Impaired Memory Impairment: Decreased recall of new information, Retrieval deficit, Storage deficit Problem Solving: Impaired Problem Solving Impairment: Functional basic Comments: slow  processnig Cognition Arousal/Alertness: Awake/alert Behavior During Therapy: WFL for tasks assessed/performed Overall Cognitive Status: Within Functional Limits for tasks assessed Area of Impairment: Memory, Problem solving, Safety/judgement Orientation Level: Disoriented to, Time(disoriented to day, knows month and year) Current Attention Level: Focused Memory: (didn't remember therapist and tech working with her yesterda) Following Commands: Follows one step commands with increased time Safety/Judgement: Decreased awareness of safety Awareness: Intellectual Problem Solving: Slow processing, Difficulty sequencing, Requires verbal cues, Requires tactile cues General Comments: pt aware of deficits and limitations     Blood pressure Marland Kitchen)  141/80, pulse 99, temperature 100.2 F (37.9 C), temperature source Oral, resp. rate 13, height 5\' 6"  (1.676 m), weight 55.7 kg, last menstrual period 05/01/2009, SpO2 100 %. Physical Exam  Nursing note and vitals reviewed. Constitutional: She is oriented to person, place, and time. She appears well-developed and well-nourished.  Pt seen up walking with PT and in room in bedside chair, slow to process/respond, appropriate otherwise, NAD  HENT:  Head: Normocephalic.  L behind the ear craniotomy incision with sutures in place Wearing Eyeglasses with L lens covered with tape in center L facial droop significant; tongue midline L Cheek puffy  Eyes:  R EOM intact L eye- can't look left/cannot go past midline Dysconjugate gaze; no nystagmus seen  Cardiovascular:  Borderline tachycardia- regular rhythm  Respiratory: No stridor.  CTA B/L- no W/R/R- good air movement   GI:  Soft, NT, ND, (+)BS  Hypoactive BS  Musculoskeletal:     Cervical back: Normal range of motion and neck supple.     Comments: UEs- 5/5 in biceps, triceps, WE< grip and finger abd B/L RLE- HF, KE, DF and PF 5/5 LLE_ HF 4+/5 otherwise KE/KF 5-/5 and DF and PF 5/5 L THR <1 yr  ago  Did 8 point turn to sit on toilet- narrow BOS; shuffling gait seen, even with HHA  Neurological: She is alert and oriented to person, place, and time.  Left facial weakness/numbness with dysarthric speech. Left lid lag with central nystagmus and dysconjugate movement. Mild left sided weakness with decrease in fine motor movements.   Finger to nose severely impaired on LUE; slow byut intact RUE  Skin:  Scar anteriorly in L hip replacement incision   Psychiatric:  Slow to process, tangential, but otherwise appropriate      Lab Results Last 24 Hours       Results for orders placed or performed during the hospital encounter of 01/18/20 (from the past 24 hour(s))  Glucose, capillary     Status: Abnormal    Collection Time: 01/30/20  3:45 PM  Result Value Ref Range    Glucose-Capillary 100 (H) 70 - 99 mg/dL  Glucose, capillary     Status: Abnormal    Collection Time: 01/30/20  7:44 PM  Result Value Ref Range    Glucose-Capillary 104 (H) 70 - 99 mg/dL    Comment 1 Notify RN      Comment 2 Document in Chart    Glucose, capillary     Status: Abnormal    Collection Time: 01/30/20 11:21 PM  Result Value Ref Range    Glucose-Capillary 105 (H) 70 - 99 mg/dL    Comment 1 Notify RN      Comment 2 Document in Chart    Glucose, capillary     Status: Abnormal    Collection Time: 01/31/20  3:21 AM  Result Value Ref Range    Glucose-Capillary 101 (H) 70 - 99 mg/dL    Comment 1 Notify RN      Comment 2 Document in Chart    Glucose, capillary     Status: Abnormal    Collection Time: 01/31/20  8:12 AM  Result Value Ref Range    Glucose-Capillary 120 (H) 70 - 99 mg/dL  Glucose, capillary     Status: Abnormal    Collection Time: 01/31/20 11:35 AM  Result Value Ref Range    Glucose-Capillary 131 (H) 70 - 99 mg/dL    Comment 1 Notify RN      Comment 2 Document in  Chart         Imaging Results (Last 48 hours)  CT HEAD WO CONTRAST   Result Date: 01/31/2020 CLINICAL DATA:  Hydrocephalus  EXAM: CT HEAD WITHOUT CONTRAST TECHNIQUE: Contiguous axial images were obtained from the base of the skull through the vertex without intravenous contrast. COMPARISON:  None. FINDINGS: Brain: Left parietal approach extraventricular drain with decreased hydrocephalus. Lateral ventricles remain mildly dilated. There is periventricular white matter hypoattenuation. No acute hemorrhage. Small amount of pneumocephalus at the right frontal pole and in the left lateral ventricle. Vascular: No abnormal hyperdensity of the major intracranial arteries or dural venous sinuses. No intracranial atherosclerosis. Skull: Left retromastoid craniectomy. Sinuses/Orbits: Fluid in left mastoid air cells. The orbits are normal. IMPRESSION: Decreased hydrocephalus status post left parietal approach extraventricular drain placement. Electronically Signed   By: Ulyses Jarred M.D.   On: 01/31/2020 04:42         Assessment/Plan: Diagnosis: craniotomy for vestibular schwannoma On L side- with dysphagia, L facial droop, L hearing loss; s/p hydrocephalus.  1. Does the need for close, 24 hr/day medical supervision in concert with the patient's rehab needs make it unreasonable for this patient to be served in a less intensive setting? Yes 2. Co-Morbidities requiring supervision/potential complications: dysphagia, slowed processing, dizziness, poor balance-headaches 3. Due to bowel management, safety, skin/wound care, disease management, medication administration, pain management and patient education, does the patient require 24 hr/day rehab nursing? Yes 4. Does the patient require coordinated care of a physician, rehab nurse, therapy disciplines of PT, OT, SLP to address physical and functional deficits in the context of the above medical diagnosis(es)? Yes Addressing deficits in the following areas: balance, endurance, locomotion, strength, transferring, bathing, dressing, feeding, grooming, toileting, cognition, speech and  swallowing 5. Can the patient actively participate in an intensive therapy program of at least 3 hrs of therapy per day at least 5 days per week? Yes 6. The potential for patient to make measurable gains while on inpatient rehab is good 7. Anticipated functional outcomes upon discharge from inpatient rehab are modified independent and supervision  with PT, modified independent and supervision with OT, modified independent and supervision with SLP. 8. Estimated rehab length of stay to reach the above functional goals is: 2-2.5 weeks 9. Anticipated discharge destination: Home 10. Overall Rehab/Functional Prognosis: good   RECOMMENDATIONS: This patient's condition is appropriate for continued rehabilitative care in the following setting: CIR Patient has agreed to participate in recommended program. Potentially Note that insurance prior authorization may be required for reimbursement for recommended care.   Comment: 1. Pt would strongly benefit from CIR due to impaired balance, dizziness, L hearing loss, s/p hydrocephalus and drain; dysphagia, and decreased cognition-  2. Will submit name for insurance approval- pt is EXCELLENT candidate 3. Suggest 2 to 2.5 weeks for pt 4. Medical issues per primary team 5. Thank you for this consult       Bary Leriche, PA-C 01/31/2020      I have personally performed a face to face diagnostic evaluation of this patient and formulated the key components of the plan.  Additionally, I have personally reviewed laboratory data, imaging studies, as well as relevant notes and concur with the physician assistant's documentation above.              Revision History      Routing History

## 2020-02-03 NOTE — TOC Transition Note (Signed)
Transition of Care [facility]) - CM/SW Discharge Note   Patient Details  Name: Cristina Conner MRN: [id number] Date of Birth: Jan 20, 1961  [facility] of [facility]) CM/SW Contact:  Verdell Carmine, RN Phone Number: 02/03/2020, 11:05 AM   Clinical Narrative:    Patients insurance authorization went through and patient is going to be admitted to CIR today. Will call [facility] and let them know, once patient leaves CIR may still need services of HH, but this will be re-evaluated by CIR.    Final next level of care: Home w Home Health Services Barriers to Discharge: Barriers Resolved   Patient Goals and CMS Choice Patient states their goals for this hospitalization and ongoing recovery are:: Going to CIR      Discharge Placement  CIR                     Discharge Plan and Services    CIR            DME Arranged: N/A DME Agency: NA       HH Arranged: PT HH Agency: [place] Date [facility] Agency Contacted: 01/21/20 Time [place]: [phone] Representative spoke with at [place]: [place]  This will be re-evaluated after stint with CIR. Social Determinants of Health (SDOH) Interventions     Readmission Risk Interventions No flowsheet data found.

## 2020-02-03 NOTE — Progress Notes (Signed)
PT Cancellation Note  Patient Details Name: Cristina Conner MRN: [id number] DOB: 1961-06-20   Cancelled Treatment:    Reason Eval/Treat Not Completed: Pain limiting ability to participate. Pt with significant HA, declining PT services at this time.   Zenaida Niece 02/03/2020, 3:38 PM

## 2020-02-03 NOTE — Progress Notes (Signed)
Cristina Gong, RN  Rehab Admission Coordinator  Physical Medicine and Rehabilitation  PMR Pre-admission     Signed  Date of Service:  02/03/2020 11:10 AM      Related encounter: ED to Hosp-Admission (Current) from 01/18/2020 in [facility]        Show:Clear all _0 Manual_1 Template_2 Copied  Added by: _3 Cristina Gong, RN  _4 Hover for details PMR Admission Coordinator Pre-Admission Assessment   Patient: Cristina Conner is an 59 y.o., female MRN: [id number] DOB: 1961/08/26 Height: _5  (167.6 cm) Weight: 55.7 kg                                                                                                                                                  Insurance Information   PRIMARY: [facility] [place]      Policy#: [id number]      Subscriber: pt CM Name: Malachy Mood      Phone#: [phone]     Fax#: [phone] Pre-Cert#: [id number]      Employer: retired Benefits:  Phone #: [phone]    Approved until 6/17 when updates are due  Name: 6/2 Eff. Date: 09/03/2019     Deduct: $3000      Out of Pocket Max: $1180 includes deductible        CIR: Insurance covers 70% after deductible; once out of pocket max met, insurance covers 100%       SNF: 70% 100 days per year Outpatient: $36 per visit     Co-Pay: visits per medical neccesity Home Health: 70%      Co-Pay: visits per medical neccesity DME: 70%     Co-Pay: 30% Providers: in network  SECONDARY: none         The "Data Collection Information Summary" for patients in Inpatient Rehabilitation Facilities with attached "Privacy Act [place] Records" was provided and verbally reviewed with: N/A   Emergency Contact Information         Contact Information     Name Relation Home Work [place] Daughter [phone]   [phone]    RIAN, KOON [phone]   [phone]       Current Medical History  Patient Admitting Diagnosis: vestibular schwannoma  s/p craniotomy   History of Present Illness:  59 y.o. female with history of N/V, anxiety d/o, L-THR 12/20, recent small bowel inflammatory changes with capsule endoscopy which unfortunately did not pass thorough. Follow up CT abdomen showed resolution of inflammatory changes but patient continued to have intermittent N/V with constipation, weakness and partial SB obstruction. Due to concerns of stricture or adhesive disease, she was admitted on 01/18/20 for hydration and  and underwent laparoscopy with lap SB resection and capsule removal on 05/19 by Dr.  Dema Severin.  Post op, hospitalist consulted to assist with  management of tachycardia and elevated BP.  She was also found to have wide based, festinating type gait intermittently tendency for LOB during PT.    CT head done revealing moderate hydrocephalus with mass effect on left side of 4th ventricle with displacement to the right and possible mass lesion in left posterior fossa displacing brachium pontis. Dr. Annette Stable consulted for input and she was transferred to [facility] from [facility] for further work up. Patient reported having progressive HA for 2 months as well as some decrease in hearing on the left.  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  She underwent left retrosigmoid craniotomy for resection of CP angle mass with placement of left occipital ventriculostomy placement by Dr. Zada Finders on 05/27. EVD removed on 5/31 as no longer working and follow up CT head showed improvement in hydrocephalus. She continues to be limited by significant vestibular symptoms, Left foot weakness with unsteady gait, bouts of lethargy, STM deficits with problems processing and dysphagia.    Past Medical History      Past Medical History:  Diagnosis Date  . Anxiety    . Arthritis    . Diverticulosis    . GERD (gastroesophageal reflux disease)     . Hypertension    . Osteopenia    . Post-operative nausea and vomiting      vomiting after general anesthesia per pt.  . Pre-diabetes        Family History  family history includes Diabetes in her mother; Heart disease in her mother; Hypertension in her father and mother.   Prior Rehab/Hospitalizations:  Has the patient had prior rehab or hospitalizations prior to admission? Yes   Has the patient had major surgery during 100 days prior to admission? Yes   Current Medications    Current Facility-Administered Medications:  .  0.9 %  sodium chloride infusion, 250 mL, Intravenous, PRN, Michael Boston, MD, Last Rate: 10 mL/hr at 01/27/20 2355, 250 mL at 01/27/20 2355 .  acetaminophen (TYLENOL) tablet 650 mg, 650 mg, Per Tube, Q4H PRN **OR** acetaminophen (TYLENOL) suppository 650 mg, 650 mg, Rectal, Q4H PRN, Judith Part, MD .  acetaminophen (TYLENOL) tablet 1,000 mg, 1,000 mg, Oral, Q6H, Ostergard, Joyice Faster, MD, 1,000 mg at 02/03/20 0902 .  alum & mag hydroxide-simeth (MAALOX/MYLANTA) 200-200-20 MG/5ML suspension 30 mL, 30 mL, Per Tube, Q6H PRN, Judith Part, MD .  bisacodyl (DULCOLAX) suppository 10 mg, 10 mg, Rectal, Q12H PRN, Michael Boston, MD, 10 mg at 01/22/20 1147 .  busPIRone (BUSPAR) tablet 5 mg, 5 mg, Oral, TID, Judith Part, MD, 5 mg at 02/03/20 0901 .  chlorhexidine (PERIDEX) 0.12 % solution 15 mL, 15 mL, Mouth Rinse, BID, Ostergard, Thomas A, MD, 15 mL at 02/03/20 0900 .  Chlorhexidine Gluconate Cloth 2 % PADS 6 each, 6 each, Topical, Daily, Kipp Brood, MD, 6 each at 02/03/20 0903 .  dicyclomine (BENTYL) capsule 10 mg, 10 mg, Per Tube, TID AC, Judith Part, MD, 10 mg at 02/03/20 0908 .  diphenhydrAMINE (BENADRYL) capsule 25 mg, 25 mg, Oral, Q6H PRN **OR** diphenhydrAMINE (BENADRYL) injection 25 mg, 25 mg, Intravenous, Q6H PRN, Ileana Roup, MD .  docusate (COLACE) 50 MG/5ML liquid 100 mg, 100 mg, Per Tube, BID, Judith Part, MD, 100  mg at 02/03/20 0902 .  famotidine (PEPCID) tablet 20 mg, 20 mg, Oral, BID, Ostergard, Joyice Faster, MD, 20 mg at  02/03/20 0859 .  feeding supplement (ENSURE ENLIVE) (ENSURE ENLIVE) liquid 237 mL, 237 mL, Oral, BID BM, Judith Part, MD, 237 mL at 02/03/20 0903 .  FLUoxetine (PROZAC) capsule 10 mg, 10 mg, Per Tube, Daily, Ostergard, Joyice Faster, MD, 10 mg at 02/03/20 0900 .  folic acid (FOLVITE) tablet 1 mg, 1 mg, Per Tube, Daily, Ostergard, Thomas A, MD, 1 mg at 02/03/20 0901 .  heparin injection 5,000 Units, 5,000 Units, Subcutaneous, Q8H, Judith Part, MD, 5,000 Units at 02/02/20 2131 .  hydrALAZINE (APRESOLINE) tablet 25 mg, 25 mg, Per Tube, Q4H PRN, Judith Part, MD .  HYDROcodone-acetaminophen (NORCO/VICODIN) 5-325 MG per tablet 1 tablet, 1 tablet, Per Tube, Q4H PRN, Judith Part, MD, 1 tablet at 02/02/20 2131 .  HYDROmorphone (DILAUDID) injection 0.5 mg, 0.5 mg, Intravenous, Q3H PRN, Judith Part, MD, 0.5 mg at 01/28/20 0819 .  hydroxypropyl methylcellulose / hypromellose (ISOPTO TEARS / GONIOVISC) 2.5 % ophthalmic solution 1 drop, 1 drop, Left Eye, Q2H, Ostergard, Joyice Faster, MD, 1 drop at 02/03/20 0859 .  ibuprofen (ADVIL) tablet 600 mg, 600 mg, Oral, Q6H PRN, Judith Part, MD, 600 mg at 02/01/20 1847 .  iron polysaccharides (NIFEREX) capsule 150 mg, 150 mg, Per Tube, Daily, Judith Part, MD, 150 mg at 02/03/20 0902 .  labetalol (NORMODYNE) injection 10-40 mg, 10-40 mg, Intravenous, Q10 min PRN, Judith Part, MD, 20 mg at 01/28/20 1637 .  magic mouthwash, 15 mL, Oral, QID PRN, Michael Boston, MD .  MEDLINE mouth rinse, 15 mL, Mouth Rinse, q12n4p, Ostergard, Joyice Faster, MD, 15 mL at 02/02/20 1422 .  methocarbamol (ROBAXIN) 1,000 mg in dextrose 5 % 100 mL IVPB, 1,000 mg, Intravenous, Q6H PRN, Michael Boston, MD, Last Rate: 200 mL/hr at 01/23/20 1949, 1,000 mg at 01/23/20 1949 .  metoprolol tartrate (LOPRESSOR) tablet 25 mg, 25 mg, Oral, BID, Ostergard,  Thomas A, MD, 25 mg at 02/03/20 0900 .  multivitamin with minerals tablet 1 tablet, 1 tablet, Per Tube, Daily, Judith Part, MD, 1 tablet at 02/03/20 0859 .  ondansetron (ZOFRAN) tablet 4 mg, 4 mg, Per Tube, Q4H PRN **OR** ondansetron (ZOFRAN) injection 4 mg, 4 mg, Intravenous, Q4H PRN, Judith Part, MD, 4 mg at 02/02/20 1421 .  polyethylene glycol (MIRALAX / GLYCOLAX) packet 17 g, 17 g, Per Tube, Daily, Ostergard, Joyice Faster, MD, 17 g at 02/01/20 1007 .  polyethylene glycol (MIRALAX / GLYCOLAX) packet 17 g, 17 g, Per Tube, Daily PRN, Ostergard, Joyice Faster, MD .  promethazine (PHENERGAN) tablet 12.5-25 mg, 12.5-25 mg, Per Tube, Q4H PRN, Judith Part, MD .  simethicone (MYLICON) 40 LT/9.0ZE suspension 40 mg, 40 mg, Per Tube, QID PRN, Judith Part, MD .  sodium chloride flush (NS) 0.9 % injection 3 mL, 3 mL, Intravenous, Once, White, Chloeanne Mt, MD .  sodium chloride flush (NS) 0.9 % injection 3 mL, 3 mL, Intravenous, Q12H, Michael Boston, MD, 3 mL at 02/03/20 0904 .  sodium chloride flush (NS) 0.9 % injection 3 mL, 3 mL, Intravenous, PRN, Michael Boston, MD .  traMADol Veatrice Bourbon) tablet 50 mg, 50 mg, Per Tube, Q6H PRN, Judith Part, MD, 50 mg at 01/29/20 2151 .  vitamin B-12 (CYANOCOBALAMIN) tablet 1,000 mcg, 1,000 mcg, Per Tube, Daily, Ostergard, Joyice Faster, MD, 1,000 mcg at 02/03/20 0902 .  zolpidem (AMBIEN) tablet 5 mg, 5 mg, Per Tube, QHS PRN,MR X 1, Ostergard, Joyice Faster, MD   Patients Current Diet:  Diet Order                      DIET DYS 3 Room service appropriate? 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  Independent   Indoor Mobility: Did the patient need assistance with walking from room to room (with or without device)? Independent   Stairs: Did the patient need assistance with internal or external stairs (with or without device)? Independent   Functional Cognition: Did the patient need help planning regular tasks such as shopping or remembering to take medications? Independent   Home Assistive Devices / Pullman Devices/Equipment: Cane (specify quad or straight) Home Equipment: Walker - 2 wheels, Bedside commode   Prior Device Use: Indicate devices/aids used by the patient prior to current illness, exacerbation or injury? None of the above   Current Functional Level Cognition   Arousal/Alertness: (drowsy) Overall Cognitive Status: Impaired/Different from baseline Current Attention Level: Selective Orientation Level: Oriented X4 Following Commands: Follows one step commands consistently, Follows multi-step commands inconsistently Safety/Judgement: Decreased awareness of safety General Comments: Pt demonstrates minimal STM deficits, therapist exited the room as SLP was present for a moment, therapist had introduced self a few minutes prior, therapist re-entered and pt stated "now who are you again?' not recalling introduction Attention: Sustained Sustained Attention: Impaired Sustained Attention Impairment: Functional basic Memory: Impaired Memory Impairment: Decreased recall of new information, Retrieval deficit, Storage deficit Problem Solving: Impaired Problem Solving Impairment: Functional basic Comments: slow processnig    Extremity Assessment (includes  Sensation/Coordination)   Upper Extremity Assessment: LUE deficits/detail RUE Deficits / Details: reports numbness and tingling but later states its the same as the Left LUE Deficits / Details: mild drift noted   Lower Extremity Assessment: Defer to PT evaluation RLE Coordination: decreased gross motor LLE Coordination: decreased gross motor     ADLs   Overall ADL's : Needs assistance/impaired Grooming: Wash/dry hands, Wash/dry face, Oral care, Brushing hair, Min guard, Standing Grooming Details (indicate cue type and reason): Improved sequencing to date, however required seated rest break to complete Upper Body Bathing: Supervision/ safety, Set up, Sitting Lower Body Bathing: Minimal assistance, Sit to/from stand Upper Body Dressing : Moderate assistance, Sitting Lower Body Dressing: Minimal assistance, Sit to/from stand Toilet Transfer: Min guard, Ambulation, Comfort height toilet, RW, Grab bars, Cueing for sequencing Toilet Transfer Details (indicate cue type and reason): Cues to use grab bars to descend instead of walker Toileting- Clothing Manipulation and Hygiene: Minimal assistance Toileting - Clothing Manipulation Details (indicate cue type and reason): Assisting with gown Functional mobility during ADLs: Min guard, Rolling walker General ADL Comments: Pt continues to make progress, limited this session due to HR     Mobility   Overal bed mobility: Needs Assistance Bed Mobility: Supine to Sit Rolling: Min guard Sidelying  to sit: Min guard, HOB elevated(at about 30 deg) Supine to sit: Min guard, HOB elevated Sit to supine: Supervision Sit to sidelying: Min guard General bed mobility comments: Pt was OOB in the recliner chair.      Transfers   Overall transfer level: Needs assistance Equipment used: Rolling walker (2 wheeled) Transfers: Sit to/from Stand Sit to Stand: Min guard Stand pivot transfers: Min assist General transfer comment: pt abvle to perform sit <>stand  transfers x4, requiring cues to use grab bar in bathroom and to power up from chair to date     Ambulation / Gait / Stairs / Wheelchair Mobility   Ambulation/Gait Ambulation/Gait assistance: Min assist, Mod assist Gait Distance (Feet): 80 Feet(40' with RW minA, 70' without device with minA-modA) Assistive device: Rolling walker (2 wheeled), None Gait Pattern/deviations: Step-to pattern, Shuffle, Decreased dorsiflexion - left General Gait Details: pt with L foot drag which increases with fatigue, intermittent festinating gait which also becomes more prevalent with fatigue. Pt with multiple anterior losses of balance Gait velocity: reduced Gait velocity interpretation: <1.8 ft/sec, indicate of risk for recurrent falls     Posture / Balance Dynamic Sitting Balance Sitting balance - Comments: supervision for toileting Balance Overall balance assessment: Needs assistance Sitting-balance support: No upper extremity supported, Feet supported Sitting balance-Leahy Scale: Good Sitting balance - Comments: supervision for toileting Postural control: Right lateral lean, Left lateral lean Standing balance support: Bilateral upper extremity supported, During functional activity Standing balance-Leahy Scale: Poor Standing balance comment: reliant on external support, much more comfortable using RW vs HHA per pt's admission High level balance activites: Backward walking, Direction changes, Turns, Sudden stops High Level Balance Comments: anterior LOB x 3 during gait and dynamic tasks     Special needs/care consideration Abdominal surgical incision as well as head surgical incision Designated visitors are daughter, [name] and second visitor to be determined on admit    Previous Home Environment  Living Arrangements: Alone  Lives With: Alone Available Help at Discharge: Family, Available 24 hours/day(daughter, [name], here to provide 24/7 initial supervision) Type of Home: House Home Layout: One  level Home Access: Stairs to enter [employer] of Steps: 2 Bathroom Shower/Tub: Multimedia programmer: Standard Bathroom Accessibility: Yes How Accessible: Accessible via walker Home Care Services: No Additional Comments: daughter will help when she goes home. Daughter is speech therapist. Pt likes to Scissors   Discharge Living Setting Plans for Discharge Living Setting: Patient's home(daughter, [name], to provide superivison. She is here from [place]) Type of Home at Discharge: House Discharge Home Layout: One level Discharge Home Access: Stairs to enter Entrance Stairs-Rails: None Entrance Stairs-Number of Steps: 2 Discharge Bathroom Shower/Tub: Walk-in shower Discharge Bathroom Toilet: Standard Discharge Bathroom Accessibility: Yes How Accessible: Accessible via walker Does the patient have any problems obtaining your medications?: No   Social/Family/Support Systems Patient Roles: Parent(part time employee) Contact Information: daughter, [name] Anticipated Caregiver: [name] Anticipated Caregiver's Contact Information: [phone] Ability/Limitations of Caregiver: [name] is an SLP; traveler working [employer] in [place]. SHe is here to initially give 24/7 superivison Caregiver Availability: 24/7 Discharge Plan Discussed with Primary Caregiver: Yes Is Caregiver In Agreement with Plan?: Yes Does Caregiver/Family have Issues with Lodging/Transportation while Pt is in Rehab?: No   Goals Patient/Family Goal for Rehab: Mod I to superivison with PT, OT, and SLP Expected length of stay: [facility] 10 to 14 days Pt/Family Agrees to Admission and willing to participate: Yes Program Orientation Provided & Reviewed with Pt/Caregiver Including Roles  & Responsibilities: Yes  Decrease burden of Care through IP rehab admission: n/a   Possible need for SNF placement upon discharge:not anticipated   Patient Condition: This patient's condition remains as documented in the consult dated  02/01/2020, in which the Rehabilitation Physician determined and documented that the patient's condition is appropriate for intensive rehabilitative care in an inpatient rehabilitation facility. Will admit to inpatient rehab today.   Preadmission Screen Completed By:  Cleatrice Burke, RN, 02/03/2020 11:21 AM ______________________________________________________________________   Discussed status with Dr. Dagoberto Ligas on 02/03/2020 at  1123 and received approval for admission today.   Admission Coordinator:  Cleatrice Burke, time 3015 Date 02/03/2020             Cosigned by: Courtney Heys, MD at 02/03/2020 11:23 AM  Revision History

## 2020-02-03 NOTE — Progress Notes (Signed)
Patient able to ambulate to the bathroom using walker an minimal assistance. Patient states she got total relief from her headache with (1) Hydrocodone.

## 2020-02-03 NOTE — PMR Pre-admission (Signed)
PMR Admission Coordinator Pre-Admission Assessment  Patient: Cristina Conner is an 59 y.o., female MRN: [id number] DOB: 27-Jul-1961 Height: _0  (167.6 cm) Weight: 55.7 kg              Insurance Information  PRIMARY: [facility] [place]      Policy#: [id number]      Subscriber: pt CM Name: Cristina Conner      Phone#: [phone]     Fax#: [phone] Pre-Cert#: [id number]      Employer: retired Benefits:  Phone #: [phone]    Approved until 6/17 when updates are due  Name: 6/2 Eff. Date: 09/03/2019     Deduct: $3000      Out of Pocket Max: $1180 includes deductible        CIR: Insurance covers 70% after deductible; once out of pocket max met, insurance covers 100%       SNF: 70% 100 days per year Outpatient: $36 per visit     Co-Pay: visits per medical neccesity Home Health: 70%      Co-Pay: visits per medical neccesity DME: 70%     Co-Pay: 30% Providers: in network  SECONDARY: none        The Data Collection Information Summary for patients in Inpatient Rehabilitation Facilities with attached Privacy Act [place] Records was provided and verbally reviewed with: N/A  Emergency Contact Information Contact Information    Name Relation Home Work Cristina Conner Daughter [phone]  [phone]   Cristina Conner [phone]  [phone]     Current Medical History  Patient Admitting Diagnosis: vestibular schwannoma s/p craniotomy  History of Present Illness:  60 y.o. female with history of N/V, anxiety d/o, L-THR 12/20, recent small bowel inflammatory changes with capsule endoscopy which unfortunately did not pass thorough. Follow up CT abdomen showed resolution of inflammatory changes but patient continued to have intermittent N/V with constipation, weakness and partial SB obstruction. Due to concerns of stricture or adhesive disease, she was admitted on 01/18/20 for hydration and  and underwent laparoscopy with lap SB resection and capsule removal on 05/19 by Dr.   Dema Severin.  Post op, hospitalist consulted to assist with management of tachycardia and elevated BP.  She was also found to have wide based, festinating type gait intermittently tendency for LOB during PT.   CT head done revealing moderate hydrocephalus with mass effect on left side of 4th ventricle with displacement to the right and possible mass lesion in left posterior fossa displacing brachium pontis. Dr. Annette Stable consulted for input and she was transferred to Cristina Conner from Cristina Conner for further work up. Patient reported having progressive HA for 2 months as well as some decrease in hearing on the left.  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  She underwent left retrosigmoid craniotomy for resection of CP angle mass with placement of left occipital ventriculostomy placement by Dr. Zada Finders on 05/27. EVD removed on 5/31 as no longer working and follow up CT head showed improvement in hydrocephalus. She continues to be limited by significant vestibular symptoms, Left foot weakness with unsteady gait, bouts of lethargy, STM deficits with problems processing and dysphagia.   Past Medical History  Past Medical History:  Diagnosis Date   Anxiety    Arthritis    Diverticulosis    GERD (gastroesophageal reflux disease)    Hypertension    Osteopenia    Post-operative nausea and  vomiting    vomiting after general anesthesia per pt.   Pre-diabetes     Family History  family history includes Diabetes in her mother; Heart disease in her mother; Hypertension in her father and mother.  Prior Rehab/Hospitalizations:  Has the patient had prior rehab or hospitalizations prior to admission? Yes  Has the patient had major surgery during 100 days prior to admission? Yes  Current Medications   Current Facility-Administered Medications:    0.9 %  sodium chloride infusion,  250 mL, Intravenous, PRN, Michael Boston, MD, Last Rate: 10 mL/hr at 01/27/20 2355, 250 mL at 01/27/20 2355   acetaminophen (TYLENOL) tablet 650 mg, 650 mg, Per Tube, Q4H PRN **OR** acetaminophen (TYLENOL) suppository 650 mg, 650 mg, Rectal, Q4H PRN, Judith Part, MD   acetaminophen (TYLENOL) tablet 1,000 mg, 1,000 mg, Oral, Q6H, Ostergard, Thomas A, MD, 1,000 mg at 02/03/20 0902   alum & mag hydroxide-simeth (MAALOX/MYLANTA) 200-200-20 MG/5ML suspension 30 mL, 30 mL, Per Tube, Q6H PRN, Judith Part, MD   bisacodyl (DULCOLAX) suppository 10 mg, 10 mg, Rectal, Q12H PRN, Michael Boston, MD, 10 mg at 01/22/20 1147   busPIRone (BUSPAR) tablet 5 mg, 5 mg, Oral, TID, Judith Part, MD, 5 mg at 02/03/20 0901   chlorhexidine (PERIDEX) 0.12 % solution 15 mL, 15 mL, Mouth Rinse, BID, Ostergard, Thomas A, MD, 15 mL at 02/03/20 0900   Chlorhexidine Gluconate Cloth 2 % PADS 6 each, 6 each, Topical, Daily, Agarwala, Ravi, MD, 6 each at 02/03/20 0903   dicyclomine (BENTYL) capsule 10 mg, 10 mg, Per Tube, TID AC, Judith Part, MD, 10 mg at 02/03/20 0908   diphenhydrAMINE (BENADRYL) capsule 25 mg, 25 mg, Oral, Q6H PRN **OR** diphenhydrAMINE (BENADRYL) injection 25 mg, 25 mg, Intravenous, Q6H PRN, White, Brogan Mt, MD   docusate (COLACE) 50 MG/5ML liquid 100 mg, 100 mg, Per Tube, BID, Judith Part, MD, 100 mg at 02/03/20 0902   famotidine (PEPCID) tablet 20 mg, 20 mg, Oral, BID, Ostergard, Thomas A, MD, 20 mg at 02/03/20 0859   feeding supplement (ENSURE ENLIVE) (ENSURE ENLIVE) liquid 237 mL, 237 mL, Oral, BID BM, Ostergard, Joyice Faster, MD, 237 mL at 02/03/20 0903   FLUoxetine (PROZAC) capsule 10 mg, 10 mg, Per Tube, Daily, Ostergard, Thomas A, MD, 10 mg at 66/06/30 1601   folic acid (FOLVITE) tablet 1 mg, 1 mg, Per Tube, Daily, Ostergard, Thomas A, MD, 1 mg at 02/03/20 0901   heparin injection 5,000 Units, 5,000 Units, Subcutaneous, Q8H, Ostergard, Joyice Faster, MD, 5,000  Units at 02/02/20 2131   hydrALAZINE (APRESOLINE) tablet 25 mg, 25 mg, Per Tube, Q4H PRN, Judith Part, MD   HYDROcodone-acetaminophen (NORCO/VICODIN) 5-325 MG per tablet 1 tablet, 1 tablet, Per Tube, Q4H PRN, Judith Part, MD, 1 tablet at 02/02/20 2131   HYDROmorphone (DILAUDID) injection 0.5 mg, 0.5 mg, Intravenous, Q3H PRN, Judith Part, MD, 0.5 mg at 01/28/20 0932   hydroxypropyl methylcellulose / hypromellose (ISOPTO TEARS / GONIOVISC) 2.5 % ophthalmic solution 1 drop, 1 drop, Left Eye, Q2H, Ostergard, Thomas A, MD, 1 drop at 02/03/20 0859   ibuprofen (ADVIL) tablet 600 mg, 600 mg, Oral, Q6H PRN, Judith Part, MD, 600 mg at 02/01/20 1847   iron polysaccharides (NIFEREX) capsule 150 mg, 150 mg, Per Tube, Daily, Judith Part, MD, 150 mg at 02/03/20 0902   labetalol (NORMODYNE) injection 10-40 mg, 10-40 mg, Intravenous, Q10 min PRN, Judith Part, MD, 20 mg at 01/28/20 1637  magic mouthwash, 15 mL, Oral, QID PRN, Michael Boston, MD   MEDLINE mouth rinse, 15 mL, Mouth Rinse, q12n4p, Ostergard, Joyice Faster, MD, 15 mL at 02/02/20 1422   methocarbamol (ROBAXIN) 1,000 mg in dextrose 5 % 100 mL IVPB, 1,000 mg, Intravenous, Q6H PRN, Michael Boston, MD, Last Rate: 200 mL/hr at 01/23/20 1949, 1,000 mg at 01/23/20 1949   metoprolol tartrate (LOPRESSOR) tablet 25 mg, 25 mg, Oral, BID, Judith Part, MD, 25 mg at 02/03/20 0900   multivitamin with minerals tablet 1 tablet, 1 tablet, Per Tube, Daily, Judith Part, MD, 1 tablet at 02/03/20 0859   ondansetron (ZOFRAN) tablet 4 mg, 4 mg, Per Tube, Q4H PRN **OR** ondansetron (ZOFRAN) injection 4 mg, 4 mg, Intravenous, Q4H PRN, Judith Part, MD, 4 mg at 02/02/20 1421   polyethylene glycol (MIRALAX / GLYCOLAX) packet 17 g, 17 g, Per Tube, Daily, Ostergard, Joyice Faster, MD, 17 g at 02/01/20 1007   polyethylene glycol (MIRALAX / GLYCOLAX) packet 17 g, 17 g, Per Tube, Daily PRN, Judith Part,  MD   promethazine (PHENERGAN) tablet 12.5-25 mg, 12.5-25 mg, Per Tube, Q4H PRN, Judith Part, MD   simethicone (MYLICON) 40 EU/2.3NT suspension 40 mg, 40 mg, Per Tube, QID PRN, Judith Part, MD   sodium chloride flush (NS) 0.9 % injection 3 mL, 3 mL, Intravenous, Once, White, Beckey Mt, MD   sodium chloride flush (NS) 0.9 % injection 3 mL, 3 mL, Intravenous, Q12H, Gross, Remo Lipps, MD, 3 mL at 02/03/20 0904   sodium chloride flush (NS) 0.9 % injection 3 mL, 3 mL, Intravenous, PRN, Michael Boston, MD   traMADol Veatrice Bourbon) tablet 50 mg, 50 mg, Per Tube, Q6H PRN, Judith Part, MD, 50 mg at 01/29/20 2151   vitamin B-12 (CYANOCOBALAMIN) tablet 1,000 mcg, 1,000 mcg, Per Tube, Daily, Judith Part, MD, 1,000 mcg at 02/03/20 0902   zolpidem (AMBIEN) tablet 5 mg, 5 mg, Per Tube, QHS PRN,MR X 1, Ostergard, Joyice Faster, MD  Patients Current Diet:  Diet Order            DIET DYS 3 Room service appropriate? 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  Independent  Indoor Mobility: Did the patient need assistance with walking from room to room (with or without device)? Independent  Stairs: Did the patient need assistance with internal or external stairs (with or without device)? Independent  Functional Cognition: Did the patient need help  planning regular tasks such as shopping or remembering to take medications? Independent  Home Assistive Devices / [place] Devices/Equipment: Cane (specify quad or straight) Home Equipment: Walker - 2 wheels, Bedside commode  Prior Device Use: Indicate devices/aids used by the patient prior to current illness, exacerbation or injury? None of the above  Current Functional Level Cognition  Arousal/Alertness: (drowsy) Overall Cognitive Status: Impaired/Different from baseline Current Attention  Level: Selective Orientation Level: Oriented X4 Following Commands: Follows one step commands consistently, Follows multi-step commands inconsistently Safety/Judgement: Decreased awareness of safety General Comments: Pt demonstrates minimal STM deficits, therapist exited the room as SLP was present for a moment, therapist had introduced self a few minutes prior, therapist re-entered and pt stated "now who are you again?' not recalling introduction Attention: Sustained Sustained Attention: Impaired Sustained Attention Impairment: Functional basic Memory: Impaired Memory Impairment: Decreased recall of new information, Retrieval deficit, Storage deficit Problem Solving: Impaired Problem Solving Impairment: Functional basic Comments: slow processnig    Extremity Assessment (includes Sensation/Coordination)  Upper Extremity Assessment: LUE deficits/detail RUE Deficits / Details: reports numbness and tingling but later states its the same as the Left LUE Deficits / Details: mild drift noted   Lower Extremity Assessment: Defer to PT evaluation RLE Coordination: decreased gross motor LLE Coordination: decreased gross motor    ADLs  Overall ADL's : Needs assistance/impaired Grooming: Wash/dry hands, Wash/dry face, Oral care, Brushing hair, Min guard, Standing Grooming Details (indicate cue type and reason): Improved sequencing to date, however required seated rest break to  complete Upper Body Bathing: Supervision/ safety, Set up, Sitting Lower Body Bathing: Minimal assistance, Sit to/from stand Upper Body Dressing : Moderate assistance, Sitting Lower Body Dressing: Minimal assistance, Sit to/from stand Toilet Transfer: Min guard, Ambulation, Comfort height toilet, RW, Grab bars, Cueing for sequencing Toilet Transfer Details (indicate cue type and reason): Cues to use grab bars to descend instead of walker Toileting- Clothing Manipulation and Hygiene: Minimal assistance Toileting - Clothing Manipulation Details (indicate cue type and reason): Assisting with gown Functional mobility during ADLs: Min guard, Rolling walker General ADL Comments: Pt continues to make progress, limited this session due to HR    Mobility  Overal bed mobility: Needs Assistance Bed Mobility: Supine to Sit Rolling: Min guard Sidelying to sit: Min guard, HOB elevated(at about 30 deg) Supine to sit: Min guard, HOB elevated Sit to supine: Supervision Sit to sidelying: Min guard General bed mobility comments: Pt was OOB in the recliner chair.     Transfers  Overall transfer level: Needs assistance Equipment used: Rolling walker (2 wheeled) Transfers: Sit to/from Stand Sit to Stand: Min guard Stand pivot transfers: Min assist General transfer comment: pt abvle to perform sit <>stand transfers x4, requiring cues to use grab bar in bathroom and to power up from chair to date    Ambulation / Gait / Stairs / Wheelchair Mobility  Ambulation/Gait Ambulation/Gait assistance: Min assist, Mod assist Gait Distance (Feet): 80 Feet(40' with RW minA, 57' without device with minA-modA) Assistive device: Rolling walker (2 wheeled), None Gait Pattern/deviations: Step-to pattern, Shuffle, Decreased dorsiflexion - left General Gait Details: pt with L foot drag which increases with fatigue, intermittent festinating gait which also becomes more prevalent with fatigue. Pt with multiple anterior losses  of balance Gait velocity: reduced Gait velocity interpretation: <1.8 ft/sec, indicate of risk for recurrent falls    Posture / Balance Dynamic Sitting Balance Sitting balance - Comments: supervision for toileting Balance Overall balance assessment: Needs assistance Sitting-balance support: No upper extremity supported, Feet supported Sitting balance-Leahy Scale: Good Sitting balance - Comments: supervision for toileting Postural control: Right lateral lean, Left lateral lean Standing balance support: Bilateral upper extremity supported, During functional activity Standing balance-Leahy Scale: Poor Standing balance comment: reliant on external support, much more comfortable using RW vs HHA per pt's admission High level balance activites: Backward walking, Direction changes, Turns, Sudden stops High Level Balance Comments: anterior LOB x  3 during gait and dynamic tasks    Special needs/care consideration Abdominal surgical incision as well as head surgical incision Designated visitors are daughter, [name] and second visitor to be determined on admit   Previous Home Environment  Living Arrangements: Alone  Lives With: Alone Available Help at Discharge: Family, Available 24 hours/day(daughter, [name], here to provide 24/7 initial supervision) Type of Home: House Home Layout: One level Home Access: Stairs to enter [employer] of Steps: 2 Bathroom Shower/Tub: Multimedia programmer: Standard Bathroom Accessibility: Yes How Accessible: Accessible via walker [place]: No Additional Comments: daughter will help when she goes home. Daughter is speech therapist. Pt likes to [place]  Discharge Living Setting Plans for Discharge Living Setting: Patient's home(daughter, [name], to provide superivison. She is here from [place]) Type of Home at Discharge: House Discharge Home Layout: One level Discharge Home Access: Stairs to enter Entrance Stairs-Rails: None Entrance  Stairs-Number of Steps: 2 Discharge Bathroom Shower/Tub: Walk-in shower Discharge Bathroom Toilet: Standard Discharge Bathroom Accessibility: Yes How Accessible: Accessible via walker Does the patient have any problems obtaining your medications?: No  Social/Family/Support Systems Patient Roles: Parent(part time employee) Contact Information: daughter, [name] Anticipated Caregiver: [name] Anticipated Caregiver's Contact Information: [phone] Ability/Limitations of Caregiver: [name] is an SLP; traveler working [employer] in [place]. SHe is here to initially give 24/7 superivison Caregiver Availability: 24/7 Discharge Plan Discussed with Primary Caregiver: Yes Is Caregiver In Agreement with Plan?: Yes Does Caregiver/Family have Issues with Lodging/Transportation while Pt is in Rehab?: No  Goals Patient/Family Goal for Rehab: Mod I to superivison with PT, OT, and SLP Expected length of stay: [facility] 10 to 14 days Pt/Family Agrees to Admission and willing to participate: Yes Program Orientation Provided & Reviewed with Pt/Caregiver Including Roles  & Responsibilities: Yes  Decrease burden of Care through IP rehab admission: n/a  Possible need for SNF placement upon discharge:not anticipated  Patient Condition: This patient's condition remains as documented in the consult dated 02/01/2020, in which the Rehabilitation Physician determined and documented that the patient's condition is appropriate for intensive rehabilitative care in an inpatient rehabilitation facility. Will admit to inpatient rehab today.  Preadmission Screen Completed By:  Cleatrice Burke, RN, 02/03/2020 11:21 AM ______________________________________________________________________   Discussed status with Dr. Dagoberto Ligas on 02/03/2020 at  1123 and received approval for admission today.  Admission Coordinator:  Cleatrice Burke, time 2258 Date 02/03/2020

## 2020-02-03 NOTE — Progress Notes (Signed)
Inpatient Rehabilitation Medication Review by a Pharmacist  A complete drug regimen review was completed for this patient to identify any potential clinically significant medication issues.  Clinically significant medication issues were identified:  no   Type of Medication Issue Identified Description of Issue Plan   Drug Interaction(s) (clinically significant)  Buspirone, fluoxetine, mirtazapine, tramadol, and trazodone all increase risk of serotonergic effects (3 are home meds) Monitor    Duplicate Therapy      Allergy      No Medication Administration End Date      Incorrect Dose      Additional Drug Therapy Needed      Other  Home eszopiclone held due to non-formulary status Trazodone PRN ordered     Time spent performing this drug regimen review (minutes):  [place], PharmD, BCPS, [facility] Clinical Pharmacist  Please check AMION for all [place] phone numbers After 10:00 PM, call [place]

## 2020-02-03 NOTE — Progress Notes (Signed)
Neurosurgery Service Progress Note  Subjective: No acute events overnight, energy level improving significantly, feels much better than preop  Objective: Vitals:   02/02/20 2130 02/02/20 2345 02/03/20 0349 02/03/20 0751  BP: 133/78   134/84  Pulse: (!) 105   90  Resp:    14  Temp:  98.4 F (36.9 C) 98.2 F (36.8 C) 98.6 F (37 C)  TempSrc:  Oral Oral Oral  SpO2:    99%  Weight:      Height:       Temp (24hrs), Avg:98.5 F (36.9 C), Min:98.2 F (36.8 C), Max:98.9 F (37.2 C)  CBC Latest Ref Rng & Units 01/29/2020 01/27/2020 01/26/2020  WBC 4.0 - 10.5 K/uL 9.7 9.3 15.5(H)  Hemoglobin 12.0 - 15.0 g/dL 7.7(L) 7.5(L) 9.6(L)  Hematocrit 36.0 - 46.0 % 24.9(L) 24.9(L) 31.2(L)  Platelets 150 - 400 K/uL 309 322 419(H)   BMP Latest Ref Rng & Units 01/27/2020 01/26/2020 01/24/2020  Glucose 70 - 99 mg/dL - 133(H) -  BUN 6 - 20 mg/dL - 13 -  Creatinine 0.44 - 1.00 mg/dL 0.78 0.84 0.92  BUN/Creat Ratio 9 - 23 - - -  Sodium 135 - 145 mmol/L - 140 -  Potassium 3.5 - 5.1 mmol/L - 3.9 -  Chloride 98 - 111 mmol/L - 106 -  CO2 22 - 32 mmol/L - 24 -  Calcium 8.9 - 10.3 mg/dL - 9.4 -    Intake/Output Summary (Last 24 hours) at 02/03/2020 0815 Last data filed at 02/03/2020 0538 Gross per 24 hour  Intake 450 ml  Output --  Net 450 ml    Current Facility-Administered Medications:  .  0.9 %  sodium chloride infusion, 250 mL, Intravenous, PRN, Michael Boston, MD, Last Rate: 10 mL/hr at 01/27/20 2355, 250 mL at 01/27/20 2355 .  acetaminophen (TYLENOL) tablet 650 mg, 650 mg, Per Tube, Q4H PRN **OR** acetaminophen (TYLENOL) suppository 650 mg, 650 mg, Rectal, Q4H PRN, Judith Part, MD .  acetaminophen (TYLENOL) tablet 1,000 mg, 1,000 mg, Oral, Q6H, Miel Wisener A, MD, 1,000 mg at 02/03/20 0500 .  alum & mag hydroxide-simeth (MAALOX/MYLANTA) 200-200-20 MG/5ML suspension 30 mL, 30 mL, Per Tube, Q6H PRN, Judith Part, MD .  bisacodyl (DULCOLAX) suppository 10 mg, 10 mg, Rectal, Q12H  PRN, Michael Boston, MD, 10 mg at 01/22/20 1147 .  busPIRone (BUSPAR) tablet 5 mg, 5 mg, Oral, TID, Judith Part, MD, 5 mg at 02/02/20 2128 .  chlorhexidine (PERIDEX) 0.12 % solution 15 mL, 15 mL, Mouth Rinse, BID, Princesa Willig, Joyice Faster, MD, 15 mL at 02/02/20 0852 .  Chlorhexidine Gluconate Cloth 2 % PADS 6 each, 6 each, Topical, Daily, Kipp Brood, MD, 6 each at 02/02/20 0855 .  dicyclomine (BENTYL) capsule 10 mg, 10 mg, Per Tube, TID AC, Zaul Hubers, Joyice Faster, MD, 10 mg at 02/02/20 1711 .  diphenhydrAMINE (BENADRYL) capsule 25 mg, 25 mg, Oral, Q6H PRN **OR** diphenhydrAMINE (BENADRYL) injection 25 mg, 25 mg, Intravenous, Q6H PRN, Ileana Roup, MD .  docusate (COLACE) 50 MG/5ML liquid 100 mg, 100 mg, Per Tube, BID, Judith Part, MD, 100 mg at 02/01/20 1006 .  famotidine (PEPCID) tablet 20 mg, 20 mg, Oral, BID, Judith Part, MD, 20 mg at 02/02/20 2131 .  feeding supplement (ENSURE ENLIVE) (ENSURE ENLIVE) liquid 237 mL, 237 mL, Oral, BID BM, Trinh Sanjose A, MD, 237 mL at 02/02/20 1300 .  FLUoxetine (PROZAC) capsule 10 mg, 10 mg, Per Tube, Daily, Claudell Wohler, Joyice Faster, MD, 10  mg at 02/02/20 0854 .  folic acid (FOLVITE) tablet 1 mg, 1 mg, Per Tube, Daily, Valiant Dills, Joyice Faster, MD, 1 mg at 02/02/20 0855 .  heparin injection 5,000 Units, 5,000 Units, Subcutaneous, Q8H, Judith Part, MD, 5,000 Units at 02/02/20 2131 .  hydrALAZINE (APRESOLINE) tablet 25 mg, 25 mg, Per Tube, Q4H PRN, Judith Part, MD .  HYDROcodone-acetaminophen (NORCO/VICODIN) 5-325 MG per tablet 1 tablet, 1 tablet, Per Tube, Q4H PRN, Judith Part, MD, 1 tablet at 02/02/20 2131 .  HYDROmorphone (DILAUDID) injection 0.5 mg, 0.5 mg, Intravenous, Q3H PRN, Judith Part, MD, 0.5 mg at 01/28/20 0819 .  hydroxypropyl methylcellulose / hypromellose (ISOPTO TEARS / GONIOVISC) 2.5 % ophthalmic solution 1 drop, 1 drop, Left Eye, Q2H, Calan Doren, Joyice Faster, MD, 1 drop at 02/03/20 0733 .  ibuprofen  (ADVIL) tablet 600 mg, 600 mg, Oral, Q6H PRN, Judith Part, MD, 600 mg at 02/01/20 1847 .  iron polysaccharides (NIFEREX) capsule 150 mg, 150 mg, Per Tube, Daily, Judith Part, MD, 150 mg at 02/02/20 0853 .  labetalol (NORMODYNE) injection 10-40 mg, 10-40 mg, Intravenous, Q10 min PRN, Judith Part, MD, 20 mg at 01/28/20 1637 .  magic mouthwash, 15 mL, Oral, QID PRN, Michael Boston, MD .  MEDLINE mouth rinse, 15 mL, Mouth Rinse, q12n4p, Creighton Longley, Joyice Faster, MD, 15 mL at 02/02/20 1422 .  methocarbamol (ROBAXIN) 1,000 mg in dextrose 5 % 100 mL IVPB, 1,000 mg, Intravenous, Q6H PRN, Michael Boston, MD, Last Rate: 200 mL/hr at 01/23/20 1949, 1,000 mg at 01/23/20 1949 .  metoprolol tartrate (LOPRESSOR) tablet 25 mg, 25 mg, Oral, BID, Judith Part, MD, 25 mg at 02/02/20 2130 .  multivitamin with minerals tablet 1 tablet, 1 tablet, Per Tube, Daily, Judith Part, MD, 1 tablet at 02/02/20 0853 .  ondansetron (ZOFRAN) tablet 4 mg, 4 mg, Per Tube, Q4H PRN **OR** ondansetron (ZOFRAN) injection 4 mg, 4 mg, Intravenous, Q4H PRN, Judith Part, MD, 4 mg at 02/02/20 1421 .  polyethylene glycol (MIRALAX / GLYCOLAX) packet 17 g, 17 g, Per Tube, Daily, Kerrington Greenhalgh, Joyice Faster, MD, 17 g at 02/01/20 1007 .  polyethylene glycol (MIRALAX / GLYCOLAX) packet 17 g, 17 g, Per Tube, Daily PRN, Delcenia Inman, Joyice Faster, MD .  promethazine (PHENERGAN) tablet 12.5-25 mg, 12.5-25 mg, Per Tube, Q4H PRN, Judith Part, MD .  simethicone (MYLICON) 40 [id number] suspension 40 mg, 40 mg, Per Tube, QID PRN, Judith Part, MD .  sodium chloride flush (NS) 0.9 % injection 3 mL, 3 mL, Intravenous, Once, White, Khadijatou Mt, MD .  sodium chloride flush (NS) 0.9 % injection 3 mL, 3 mL, Intravenous, Q12H, Michael Boston, MD, 3 mL at 02/02/20 2137 .  sodium chloride flush (NS) 0.9 % injection 3 mL, 3 mL, Intravenous, PRN, Michael Boston, MD .  traMADol Veatrice Bourbon) tablet 50 mg, 50 mg, Per Tube, Q6H PRN,  Judith Part, MD, 50 mg at 01/29/20 2151 .  vitamin B-12 (CYANOCOBALAMIN) tablet 1,000 mcg, 1,000 mcg, Per Tube, Daily, Judith Part, MD, 1,000 mcg at 02/02/20 0856 .  zolpidem (AMBIEN) tablet 5 mg, 5 mg, Per Tube, QHS PRN,MR X 1, Shrihan Putt, Joyice Faster, MD   Physical Exam: AOx3, PERRL, gaze conjugate but horizontal diplopia on testing, +L facial palsy with HB 5, +intact facial sensation in V2-3, dec'd on L in V1, Strength 5/5 x4, SILTx4  Assessment & Plan: 59 y.o. 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 5/28 post-op MRI w/ STR - small remnant in distal IAC, expected post-op diffusion changes from dissection on brainstem, 5/31 rpt CTH w/ improving ventriculomegaly, expected fluid in the L mastoid, 5/31 EVD d/c'd.  Neuro -eye gtt OS given incomplete eye closure, tape shut at night to avoid exposure keratophy  Cardiopulm No issues  FENGI -advanced to dysphagia 2 w/ thins  Heme/ID No issues  Dispo/PPx: -PT/OT/SLP rec CIR, CIR pending, ready for transfer from my perspective when able -SCDs/TEDs, SQH  Judith Part  02/03/20 8:15 AM

## 2020-02-03 NOTE — Progress Notes (Signed)
  Speech Language Pathology Treatment: Dysphagia  Patient Details Name: Cristina Conner MRN: [id number] DOB: 08/25/1961 Today's Date: 02/03/2020 Time: [id number] SLP Time Calculation (min) (ACUTE ONLY): 19 min  Assessment / Plan / Recommendation Clinical Impression  Pt reports not eating a lot off her meal trays because she does not like her food chopped/mashed. SLP provided advanced trials of soft solids, which require extra time for mastication and clearance of mild oral residue. Pt does not have sensation of her L buccal cavity but tries to keep the bolus on her R side with Mod I and can feel any residue with her tongue as she does a lingual sweep. Minimal cueing needed for safety. Discussed advancing her diet to mechanical soft solids, still trying to select softer items from the menu and cutting food into small bites for energy conservation over a meal. Pt is eager to try this and believes it will help her nutritional intake. Will advance and f/u for tolerance. She remains a good candidate for CIR- level follow up.    HPI HPI: Pt is a 59 yo female s/p lap assisted SBR for retained capsule endoscope, Dr. Dema Severin, 5/19. 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 Pt was experiencing hydrocephalus and L hearing loss. 5/27 s/p L craniotomy for tumor resection ventriculostomy. PMH: HTN, L DA THA 2020, GERD, anxiety      SLP Plan  Continue with current plan of care       Recommendations  Diet recommendations: Dysphagia 3 (mechanical soft);Thin liquid Liquids provided via: Straw Medication Administration: Crushed with puree Supervision: Patient able to self feed;Full supervision/cueing for compensatory strategies Compensations: Minimize environmental distractions;Slow rate;Small sips/bites;Lingual sweep for clearance of pocketing;Monitor for anterior loss Postural Changes and/or Swallow Maneuvers:  Seated upright 90 degrees                Oral Care Recommendations: Oral care BID Follow up Recommendations: Inpatient Rehab SLP Visit Diagnosis: Dysphagia, unspecified (R13.10) Plan: Continue with current plan of care       GO                 Osie Bond., M.A. [facility] Pager [phone] Office [phone]  02/03/2020, 10:26 AM

## 2020-02-03 NOTE — Discharge Instructions (Signed)
[facility]      [facility] [phone]  OPEN ABDOMINAL SURGERY: POST OP INSTRUCTIONS  Always review your discharge instruction sheet given to you by the facility where your surgery was performed.  IF YOU HAVE DISABILITY OR FAMILY LEAVE FORMS, YOU MUST BRING THEM TO THE OFFICE FOR PROCESSING.  PLEASE DO NOT GIVE THEM TO YOUR DOCTOR.  1. A prescription for pain medication may be given to you upon discharge.  Take your pain medication as prescribed, if needed.  If narcotic pain medicine is not needed, then you may take acetaminophen (Tylenol) or ibuprofen (Advil) as needed. 2. Take your usually prescribed medications unless otherwise directed. 3. If you need a refill on your pain medication, please contact your pharmacy. They will contact our office to request authorization.  Prescriptions will not be filled after 5pm or on week-ends. 4. You should follow a light diet the first few days after arrival home, such as soup and crackers, pudding, etc.unless your doctor has advised otherwise. A high-fiber, low fat diet can be resumed as tolerated.   Be sure to include lots of fluids daily. Most patients will experience some swelling and bruising on the chest and neck area.  Ice packs will help.  Swelling and bruising can take several days to resolve 5. Most patients will experience some swelling and bruising in the area of the incision. Ice pack will help. Swelling and bruising can take several days to resolve..  6. It is common to experience some constipation if taking pain medication after surgery.  Increasing fluid intake and taking a stool softener will usually help or prevent this problem from occurring.  A mild laxative (Milk of Magnesia or Miralax) should be taken according to package directions if there are no bowel movements after 48 hours. 7.  You may have steri-strips (small skin tapes) in place directly over the incision.  These strips should be left on the skin for 7-10 days.  If your  surgeon used skin glue on the incision, you may shower in 24 hours.  The glue will flake off over the next 2-3 weeks.  Any sutures or staples will be removed at the office during your follow-up visit. You may find that a light gauze bandage over your incision may keep your staples from being rubbed or pulled. You may shower and replace the bandage daily. 8. ACTIVITIES:  You may resume regular (light) daily activities beginning the next day--such as daily self-care, walking, climbing stairs--gradually increasing activities as tolerated.  You may have sexual intercourse when it is comfortable.  Refrain from any heavy lifting or straining until approved by your doctor. a. You may drive when you no longer are taking prescription pain medication, you can comfortably wear a seatbelt, and you can safely maneuver your car and apply brakes  9. You should see your doctor in the office for a follow-up appointment approximately two weeks after your surgery.  Make sure that you call for this appointment within a day or two after you arrive home to insure a convenient appointment time.  WHEN TO CALL YOUR DOCTOR: 1. Fever over 101.0 2. Inability to urinate 3. Nausea and/or vomiting 4. Extreme swelling or bruising 5. Continued bleeding from incision. 6. Increased pain, redness, or drainage from the incision. 7. Difficulty swallowing or breathing 8. Muscle cramping or spasms. 9. Numbness or tingling in hands or feet or around lips.  The clinic staff is available to answer your questions during regular business hours.  Please   don't hesitate to call and ask to speak to one of the nurses if you have concerns.  For further questions, please visit [email or web address]   Discharge Instructions  No restriction in activities, slowly increase your activity back to normal.   Your incision is closed with absorbable sutures. These will naturally fall off over the next 4-6 weeks. If they become bothersome or  cause discomfort, apply some antibiotic ointment like bacitracin or neosporin on the sutures. This will soften them up and usually makes them more comfortable while they dissolve.  Okay to shower on the day of discharge. Be gentle when cleaning your incision. Use regular soap and water. If that is uncomfortable, try using baby shampoo. Do not submerge the wound under water for 2 weeks after surgery.  Follow up with Dr. Zada Finders in 2 weeks after discharge. If you do not already have a discharge appointment, please call his office at [phone] to schedule a follow up appointment. If you have any concerns or questions, please call the office and let us know.

## 2020-02-03 NOTE — Progress Notes (Signed)
Patient admitted to 4W15 A&O x4. VSS. Oriented to room, call bell, and mask, fall, and visitor policy.

## 2020-02-03 NOTE — Discharge Summary (Signed)
Discharge Summary  Date of Admission: 01/18/2020  Date of Discharge: 02/03/20  Attending Physician: Emelda Brothers, MD  Hospital Course: Patient originally presented after a small bowel resection / enterotomy to extract a retained capsule endoscopy unit. Post-op she had some ataxia, which prompted CTH and showed a large CP angle mass with hydrocephalus. She was transferred to [facility] for neurosurgical care. She was taken to the OR on 5/27 for craniotomy and resection of a suspected large vestibular schwannoma and intra-op EVD placement. 5/28 post-op MRI w/ STR - small remnant in distal IAC, expected post-op diffusion changes from dissection on brainstem. 5/31 rpt CTH w/ improving ventriculomegaly, expected fluid in the L mastoid, 5/31 EVD d/c'd without recurrence of hydrocephalus. She had a significant facial palsy post-op but was able to resume a modified diet. She also had some diplopia, suspected to be from either 4th or 6th nerve palsy which was improving. PT/OT recommended CIR and she was discharged to CIR on 02/03/20. She will follow up in clinic with me in 2 weeks.  Neurologic exam at discharge:  AOx3, PERRL, EOMI, +L HB5 facial palsy with CN5 numbness, TM Strength 5/5 x4, SILTx4, no drift  Discharge diagnosis: Vestibular schwannoma, hydrocephalus  Allergies as of 02/03/2020      Reactions   Hydrocodone Nausea And Vomiting      Medication List    STOP taking these medications   mirtazapine 7.5 MG tablet Commonly known as: REMERON     TAKE these medications   acetaminophen 500 MG tablet Commonly known as: TYLENOL Take 1,000 mg by mouth as needed for moderate pain.   busPIRone 5 MG tablet Commonly known as: BUSPAR Take 1 tablet (5 mg total) by mouth 3 (three) times daily.   cyanocobalamin 1000 MCG tablet Take 1 tablet (1,000 mcg total) by mouth daily.   dicyclomine 10 MG capsule Commonly known as: BENTYL Take 1 capsule (10 mg total) by mouth 3 (three) times daily before  meals.   eszopiclone 1 MG Tabs tablet Commonly known as: LUNESTA Take 1 mg by mouth at bedtime as needed for sleep.   famotidine 40 MG tablet Commonly known as: PEPCID Take 40 mg by mouth daily.   FLUoxetine 10 MG tablet Commonly known as: PROZAC Take 1 tablet (10 mg total) by mouth daily.   folic acid 1 MG tablet Commonly known as: FOLVITE Take 1 tablet (1 mg total) by mouth daily.   hydroxypropyl methylcellulose / hypromellose 2.5 % ophthalmic solution Commonly known as: ISOPTO TEARS / GONIOVISC Place 1 drop into the left eye every 2 (two) hours.   Integra 62.5-62.5-40-3 MG Caps Take 62.5 mg by mouth daily.   iron polysaccharides 150 MG capsule Commonly known as: NIFEREX Take 1 capsule (150 mg total) by mouth daily.   metoprolol tartrate 25 MG tablet Commonly known as: LOPRESSOR Take 1 tablet (25 mg total) by mouth 2 (two) times daily.   ondansetron 4 MG tablet Commonly known as: ZOFRAN Take 1 tablet (4 mg total) by mouth every 6 (six) hours as needed for nausea or vomiting.   ondansetron 8 MG disintegrating tablet Commonly known as: ZOFRAN-ODT Take 1 tablet (8 mg total) by mouth every 8 (eight) hours as needed for nausea or refractory nausea / vomiting.   traMADol 50 MG tablet Commonly known as: ULTRAM Take 1 tablet (50 mg total) by mouth every 6 (six) hours as needed (pain not controlled with tylenol and ibuprofen).         Judith Part, MD  02/03/20 10:23 AM

## 2020-02-03 NOTE — Progress Notes (Signed)
Inpatient Rehabilitation Admissions Coordinator  Insurance has approved and bed is available to admit patient to CIR today. I met with patient at bedside and spoke with daughter, [name] by phone. They are in agreement. I contacted Dr. Zada Finders, acute team and [facility] team, Caren Griffins. I will make the arrangements to admit today.  Danne Baxter, RN, MSN Rehab Admissions Coordinator [phone] 02/03/2020 10:27 AM

## 2020-02-03 NOTE — Progress Notes (Signed)
Patient able to take po meds well as long as the meds are placed in the right side of the mouth to assist in swallowing. Patietn states that the left side of her mouth is totally numb.

## 2020-02-03 NOTE — H&P (Signed)
Physical Medicine and Rehabilitation Admission H&P    Chief Complaint  Patient presents with  . Functional decline     HPI: Cristina Conner is a 59 year old female with history of L-THR, anxiety d/o, N/V with small bowel inflammatory changes and recent capsule endoscopy with failure of capsule to pass thorough.  Follow-up CT abdomen showed resolution of inflammatory changes but patient continued to have intermittent nausea and vomiting with constipation, weakness and partial small bowel obstruction.  Due to concerns of stricture or adhesive disease, she was admitted on 01/18/2020 for hydration and underwent laparoscopy with labs SB resection and capsule removal on 05/18 by Dr. Dema Severin.  Postop has had issues with tachycardia as well as elevated BP.  She was found to have a wide-based festinating type gait used intermittently with a tendency to use balance during physical therapy.  CT of head done revealing moderate hydrocephalus with mass-effect on left side of fourth ventricle with displacement to the right and possible mass lesion left posterior fossa displacing brachium pontis.  Dr. Trenton Gammon was consulted for input and she was transferred to [facility] for further work-up.  Patient did report having progressive headaches for 2 months with some decrease in hearing on the left.  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  She underwent left rectosigmoid craniotomy for resection of CP mass with placement of left occipital ventriculostomy by Dr. Venetia Constable on 05/27.  EVD removed on 05/31 as was no longer working and follow-up CT head showed improvement in hydrocephalus.  Nausea/vomiting has resolved with improvement in intake. ABLA being monitored. She continues to have issues with headaches, dizziness, diplopia as well as left foot weakness with unsteady gait.  STM  deficits reported with problems processing as well as mild dysphagia.  CIR was recommended due to functional decline.   Pt reports just had a BM just now in bathroom- voiding well with no purewick- going to [facility] vs toilet.   Still having double vision  Review of Systems  Constitutional: Negative for chills and fever.  HENT: Positive for hearing loss (on left). Negative for tinnitus.   Eyes: Positive for blurred vision (left eye) and double vision (without glasses). Negative for photophobia and pain.  Cardiovascular: Negative for chest pain and palpitations.  Gastrointestinal: Negative for constipation, heartburn and nausea.  Genitourinary: Negative for dysuria and urgency.  Musculoskeletal: Positive for joint pain (increase pain in left hip).  Skin: Negative for rash.  Neurological: Positive for dizziness (when looking upwards) and headaches.  Psychiatric/Behavioral: The patient does not have insomnia.   All other systems reviewed and are negative.   Past Medical History:  Diagnosis Date  . Anxiety   . Arthritis   . Diverticulosis   . GERD (gastroesophageal reflux disease)   . Hypertension   . Osteopenia   . Post-operative nausea and vomiting    vomiting after general anesthesia per pt.  . Pre-diabetes     Past Surgical History:  Procedure Laterality Date  . CESAREAN SECTION     x 2  . COLONOSCOPY    . CRANIOTOMY Left 01/27/2020   Procedure: Left Craniotomy for Tumor Resection;  Surgeon: Judith Part, MD;  Location: [place];  Service: Neurosurgery;  Laterality: Left;  . ENDOMETRIAL ABLATION    . ESOPHAGOGASTRODUODENOSCOPY  10/2019  . LAPAROSCOPY N/A 01/19/2020   Procedure: LAPAROSCOPY DIAGNOSTIC LAPAROTOMY WITH SMALL BOWEL RESECTION;  Surgeon: Dema Severin,  Carson Mt, MD;  Location: [facility];  Service: General;  Laterality: N/A;  . PELVIC LAPAROSCOPY  1999   left salpingectomy, excision of Right, paratubal cyst  . PELVIC LAPAROSCOPY     laparoscopy with lysis of pelvic  adhesions  . SHOULDER SURGERY  11/2010   "FROZEN SHOULDER"  . TOTAL HIP ARTHROPLASTY Left 08/17/2019   Procedure: LEFT TOTAL HIP ARTHROPLASTY ANTERIOR APPROACH;  Surgeon: Melrose Nakayama, MD;  Location: [facility];  Service: Orthopedics;  Laterality: Left;  . TUBAL LIGATION    . UPPER GASTROINTESTINAL ENDOSCOPY    . VENTRICULOSTOMY Left 01/27/2020   Procedure: VENTRICULOSTOMY;  Surgeon: Judith Part, MD;  Location: [place];  Service: Neurosurgery;  Laterality: Left;    Family History  Problem Relation Age of Onset  . Hypertension Mother   . Heart disease Mother   . Diabetes Mother   . Hypertension Father   . Colon cancer Neg Hx   . Esophageal cancer Neg Hx   . Stomach cancer Neg Hx   . Rectal cancer Neg Hx   . Colon polyps Neg Hx     Social History: Widowed last July--has been sedentary for the past year. 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 Daughter from [place] has moved in to help post surgery. She reports that she has never smoked. She has never used smokeless tobacco. She reports previous alcohol use. She reports that she does not use drugs.     Allergies  Allergen Reactions  . Hydrocodone Nausea And Vomiting    Medications Prior to Admission  Medication Sig Dispense Refill  . acetaminophen (TYLENOL) 500 MG tablet Take 1,000 mg by mouth as needed for moderate pain.    Marland Kitchen dicyclomine (BENTYL) 10 MG capsule Take 1 capsule (10 mg total) by mouth 3 (three) times daily before meals. 90 capsule 3  . eszopiclone (LUNESTA) 1 MG TABS tablet Take 1 mg by mouth at bedtime as needed for sleep.     . famotidine (PEPCID) 40 MG tablet Take 40 mg by mouth daily.     . Fe Fum-FePoly-Vit C-Vit B3 (INTEGRA) 62.5-62.5-40-3 MG CAPS Take 62.5 mg by mouth daily. 30 capsule 6  . FLUoxetine (PROZAC) 10 MG tablet Take 1 tablet (10 mg total) by mouth daily. 90 tablet 0  . ondansetron (ZOFRAN) 4 MG tablet Take 1 tablet (4 mg total) by mouth every 6 (six) hours as  needed for nausea or vomiting. 50 tablet 4  . mirtazapine (REMERON) 7.5 MG tablet Take 1 tablet (7.5 mg total) by mouth at bedtime. (Patient not taking: Reported on 01/18/2020) 30 tablet 1  . ondansetron (ZOFRAN-ODT) 8 MG disintegrating tablet Take 1 tablet (8 mg total) by mouth every 8 (eight) hours as needed for nausea or refractory nausea / vomiting. (Patient not taking: Reported on 01/18/2020) 20 tablet 0    Drug Regimen Review  Drug regimen was reviewed and remains appropriate with no significant issues identified  Home: Home Living Family/patient expects to be discharged to:: Private residence Living Arrangements: Alone Available Help at Discharge: Family Type of Home: House Home Access: Stairs to enter Technical brewer of Steps: 2 [place]: One level Bathroom Shower/Tub: Multimedia programmer: [place]: Environmental consultant - 2 wheels, Bedside commode Additional Comments: daughter will help when she goes home. Daughter is speech therapist. Pt likes to Zumba   Functional History: Prior Function Level of Independence: Independent Comments: dtr states she is independent  Functional Status:  Mobility:  Bed Mobility Overal bed mobility: Needs Assistance Bed Mobility: Supine to Sit, Sit to Supine Rolling: Min guard Sidelying to sit: Min guard, HOB elevated(at about 30 deg) Supine to sit: Min guard, HOB elevated Sit to supine: Supervision Sit to sidelying: Min guard General bed mobility comments: Pt was OOB in the recliner chair.  Transfers Overall transfer level: Needs assistance Equipment used: Rolling walker (2 wheeled), None Transfers: Sit to/from Stand, Stand Pivot Transfers Sit to Stand: Supervision Stand pivot transfers: Min assist General transfer comment: pt performs sit to stand with RW and in bathroom with use of grab bar with supervision. Pt needing minA to stand pivot transfers due to imbalance Ambulation/Gait Ambulation/Gait assistance: Min  assist, Mod assist Gait Distance (Feet): 80 Feet(40' with RW minA, 14' without device with minA-modA) Assistive device: Rolling walker (2 wheeled), None Gait Pattern/deviations: Step-to pattern, Shuffle, Decreased dorsiflexion - left General Gait Details: pt with L foot drag which increases with fatigue, intermittent festinating gait which also becomes more prevalent with fatigue. Pt with multiple anterior losses of balance Gait velocity: reduced Gait velocity interpretation: <1.8 ft/sec, indicate of risk for recurrent falls    ADL: ADL Overall ADL's : Needs assistance/impaired Grooming: Wash/dry hands, Wash/dry face, Oral care, Brushing hair, Min guard, Standing Grooming Details (indicate cue type and reason): Pt brushed teeth, and forgot to wipe her mouth leaving  toothpaste on her mouth.  Cues provided for sequencing  Upper Body Bathing: Supervision/ safety, Set up, Sitting Lower Body Bathing: Minimal assistance, Sit to/from stand Upper Body Dressing : Moderate assistance, Sitting Lower Body Dressing: Minimal assistance, Sit to/from stand Toilet Transfer: Min guard, Ambulation, Comfort height toilet, RW, Grab bars Toilet Transfer Details (indicate cue type and reason): unable to attempt this session due to nausea Toileting- Clothing Manipulation and Hygiene: Minimal assistance, Sit to/from stand Functional mobility during ADLs: Min guard, Rolling walker General ADL Comments: pt progresed to EOB and required return to supine due to nauseated and RN giving medication  Cognition: Cognition Overall Cognitive Status: Impaired/Different from baseline Arousal/Alertness: (drowsy) Orientation Level: Oriented X4 Attention: Sustained Sustained Attention: Impaired Sustained Attention Impairment: Functional basic Memory: Impaired Memory Impairment: Decreased recall of new information, Retrieval deficit, Storage deficit Problem Solving: Impaired Problem Solving Impairment: Functional  basic Comments: slow processnig Cognition Arousal/Alertness: Awake/alert Behavior During Therapy: WFL for tasks assessed/performed Overall Cognitive Status: Impaired/Different from baseline Area of Impairment: Memory, Following commands, Problem solving, Attention Orientation Level: Disoriented to, Time(disoriented to day, knows month and year) Current Attention Level: Selective Memory: Decreased short-term memory Following Commands: Follows one step commands consistently, Follows multi-step commands inconsistently Safety/Judgement: Decreased awareness of safety Awareness: Emergent Problem Solving: Slow processing General Comments: Pt with decreased STM, but generally, cognition improving, high anxiety when AD taken away, difficulty sequencing safely needing multiple cues throughout session for similar safety cues (stay near railing in the hallway, turn all the way around and back up/reach back before sitting down).    Physical Exam: Blood pressure 139/81, pulse 95, temperature 98.6 F (37 C), temperature source Oral, resp. rate 11, height 5\' 6"  (1.676 m), weight 55.7 kg, last menstrual period 05/01/2009, SpO2 99 %. Physical Exam  Nursing note and vitals reviewed. Constitutional:  Sitting up in bed; daughter at bedside, appropriate, tape on L lens of eyeglasses, NAD  HENT:  Head: Normocephalic.  Nose: Nose normal.  Mouth/Throat: Oropharynx is clear and moist. No oropharyngeal exudate.  Couple of staples at prior drain site left scalp. Crani incision C/D/I- behind L ear- less swelling  noted from a few days ago L facial droop- however face now only a little puffy- less swelling of cheek.    Eyes: Right eye exhibits no discharge. Left eye exhibits no discharge. No scleral icterus.  L eye not looking laterally very well- was able to look a little past midline today- which is better Double vision still looking midline and looking Left, but not looking right when tested- no nystagmus  seen L lower eyelid drooping  Neck: No tracheal deviation present.  Cardiovascular: Tachycardia present. Exam reveals no gallop and no friction rub.  No murmur heard. Heart rate 110's at rest Tachycardic- regular rhythm  Respiratory: She has no wheezes.  CTA B/L- no W/R/R  GI: She exhibits no distension.  Incision under umbilicus C/D/I  Soft, NT, ND, (+)BS   Musculoskeletal:     Cervical back: Normal range of motion and neck supple.     Comments: 5/5 in UEs deltoids, biceps, triceps, WE, grip and finger abd B/L- same RLE- same since 2 days ago- HF, KE, KF, DF and PF 5/5 LLE_ now HF 5-/5; otherwise 5/5 in KE/KF DF and PF  Neurological: She is alert.  Left facial paresis with mild dysmathia. Left ptosis with diplopia--able to compensate with taped glasses. EOMI appears smoother. Left eye has few beats nystagmus with upward and medial gaze.  LUE -fine motor coordination improving with mild ataxia.    Skin: Skin is warm and dry.  Well healed old anterior L-THR incision - well healed No skin breakdown on backside- no erythema IV L forearm- looks OK  Psychiatric: She has a normal mood and affect.    No results found for this or any previous visit (from the past 48 hour(s)). No results found.     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  Antithrombotics: -DVT/anticoagulation:  Pharmaceutical: Heparin  -antiplatelet therapy: N/a 3. Headaches/Pain Management: Worse at nights--continue hydrocodone prn.  Continue tylenol qid--decrease to 650 mg.  4. Mood: LCSW to follow for evaluation and support.   -antipsychotic agents: N/A 5. Neuropsych: This patient is capable of making decisions on her own behalf. 6. Skin/Wound Care: Monitor for healing.  Routine pressure relief measures.  7. Fluids/Electrolytes/Nutrition: Monitor I/O. Intake improved as N/V now resolved.  8. Left ptosis: Continue eye drops 5 X day and patch at night/when asleep. Patient educated on compliance.  9. Resting tachycardia: Continue metoprolol and montinue for now. Multifactorial due to surgery, debility, anemia.  10. ABLA: Iron dextran 5/23--continue iron supplement.  Recheck labs in am. 11. HTN: Poorly controlled--will monitor for now.  12. H/o depression/anxiety: Continue Prozac and Buspar--off Remeron at this time.  13. Left hip pain s/p THR 12/20: Worse now--will order Xray to evaluate.  14. Small bowel RSXN: Continue Bentyl. Has been having BMs--Will d/c miralax (has been refusing) as colace bid effective.  15. Vestibular dysfunction: N/V have resolved. Will order Vestibular evaluation/ treat.      Bary Leriche, PA-C 02/03/2020    I have personally performed a face to face diagnostic evaluation of this patient and formulated the key components of the plan.  Additionally, I have personally reviewed laboratory data, imaging studies, as well as relevant notes and concur with the physician assistant's documentation above.   The patient's status has not  changed from the original H&P.  Any changes in documentation from the acute care chart have been noted above.

## 2020-02-04 ENCOUNTER — Inpatient Hospital Stay (HOSPITAL_COMMUNITY): Payer: BC Managed Care – PPO

## 2020-02-04 ENCOUNTER — Inpatient Hospital Stay (HOSPITAL_COMMUNITY): Payer: BC Managed Care – PPO | Admitting: Speech Pathology

## 2020-02-04 DIAGNOSIS — I10 Essential (primary) hypertension: Secondary | ICD-10-CM

## 2020-02-04 DIAGNOSIS — D62 Acute posthemorrhagic anemia: Secondary | ICD-10-CM

## 2020-02-04 DIAGNOSIS — D333 Benign neoplasm of cranial nerves: Secondary | ICD-10-CM

## 2020-02-04 DIAGNOSIS — G510 Bell's palsy: Secondary | ICD-10-CM

## 2020-02-04 LAB — CBC WITH DIFFERENTIAL/PLATELET
Abs Immature Granulocytes: 0 10*3/uL (ref 0.00–0.07)
Basophils Absolute: 0 10*3/uL (ref 0.0–0.1)
Basophils Relative: 0 %
Eosinophils Absolute: 0 10*3/uL (ref 0.0–0.5)
Eosinophils Relative: 0 %
HCT: 26.6 % — ABNORMAL LOW (ref 36.0–46.0)
Hemoglobin: 8.2 g/dL — ABNORMAL LOW (ref 12.0–15.0)
Lymphocytes Relative: 12 %
Lymphs Abs: 1.2 10*3/uL (ref 0.7–4.0)
MCH: 26.2 pg (ref 26.0–34.0)
MCHC: 30.8 g/dL (ref 30.0–36.0)
MCV: 85 fL (ref 80.0–100.0)
Monocytes Absolute: 0 10*3/uL — ABNORMAL LOW (ref 0.1–1.0)
Monocytes Relative: 0 %
Neutro Abs: 8.9 10*3/uL — ABNORMAL HIGH (ref 1.7–7.7)
Neutrophils Relative %: 88 %
Platelets: 516 10*3/uL — ABNORMAL HIGH (ref 150–400)
RBC: 3.13 MIL/uL — ABNORMAL LOW (ref 3.87–5.11)
RDW: 22 % — ABNORMAL HIGH (ref 11.5–15.5)
WBC: 10.1 10*3/uL (ref 4.0–10.5)
nRBC: 0 /100 WBC
nRBC: 0.2 % (ref 0.0–0.2)

## 2020-02-04 LAB — COMPREHENSIVE METABOLIC PANEL
ALT: 32 U/L (ref 0–44)
AST: 24 U/L (ref 15–41)
Albumin: 2.7 g/dL — ABNORMAL LOW (ref 3.5–5.0)
Alkaline Phosphatase: 68 U/L (ref 38–126)
Anion gap: 9 (ref 5–15)
BUN: 11 mg/dL (ref 6–20)
CO2: 24 mmol/L (ref 22–32)
Calcium: 9 mg/dL (ref 8.9–10.3)
Chloride: 105 mmol/L (ref 98–111)
Creatinine, Ser: 0.81 mg/dL (ref 0.44–1.00)
GFR calc Af Amer: >60 mL/min (ref >60)
GFR calc non Af Amer: >60 mL/min (ref >60)
Glucose, Bld: 107 mg/dL — ABNORMAL HIGH (ref 70–99)
Potassium: 4.1 mmol/L (ref 3.5–5.1)
Sodium: 138 mmol/L (ref 135–145)
Total Bilirubin: 0.6 mg/dL (ref 0.3–1.2)
Total Protein: 5.5 g/dL — ABNORMAL LOW (ref 6.5–8.1)

## 2020-02-04 MED ORDER — BLOOD PRESSURE CONTROL BOOK
Freq: Once | Status: AC
  Filled 2020-02-04: qty 1

## 2020-02-04 MED ORDER — PRO-STAT SUGAR FREE PO LIQD
30.0000 mL | Freq: Two times a day (BID) | ORAL | Status: DC
  Administered 2020-02-04 – 2020-02-11 (×14): 30 mL via ORAL
  Filled 2020-02-04 (×16): qty 30

## 2020-02-04 NOTE — Progress Notes (Signed)
Inpatient Rehabilitation  Patient information reviewed and entered into eRehab system by Maxtyn Nuzum M. Jaselle Pryer, M.A., CCC/SLP, PPS Coordinator.  Information including medical coding, functional ability and quality indicators will be reviewed and updated through discharge.    

## 2020-02-04 NOTE — Evaluation (Signed)
Physical Therapy Assessment and Plan  Patient Details  Name: Cristina Conner MRN: [id number] Date of Birth: [birth year]  PT Diagnosis: Abnormality of gait, Coordination disorder, Difficulty walking and Pain in joint Rehab Potential: Good ELOS: 7-9 days   Today's Date: 02/04/2020 PT Individual Time: 1000-1100 PT Individual Time Calculation (min): 60 min    Problem List:  Patient Active Problem List   Diagnosis Date Noted  . 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 Protein-calorie malnutrition, severe 01/30/2020  . Brain tumor ([place]) 01/27/2020  . Low folate 01/23/2020  . Anemia of chronic disease 01/23/2020  . Small bowel obstruction from retained endoscopy capsule at stricture s/p ileal resection 01/19/2020 01/18/2020  . 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 Primary localized osteoarthritis of left hip 2019-08-31  . Death of family member-  husband passed Jul 2020 04/29/2019  . 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 Sleep difficulties 09/09/2018  . Pre-diabetes 03/09/2018  . Postmenopausal state- went thru early 40's 01/02/2018  . Primary osteoarthritis of left hip 01/02/2018  . Caregiver burden- for husband with ALLeukemia 01/02/2018  . Left hip pain 01/02/2018  . Osteopenia 07/31/2016  . Hypertension 05/20/2011  . Menopausal state 05/20/2011    Past Medical History:  Past Medical History:  Diagnosis Date  . Anxiety   . Arthritis   . Diverticulosis   . GERD (gastroesophageal reflux disease)   . Hypertension   . Osteopenia   . Post-operative nausea and vomiting    vomiting after general anesthesia per pt.  . Pre-diabetes    Past Surgical History:  Past Surgical History:  Procedure Laterality Date  . CESAREAN SECTION     x 2  . COLONOSCOPY    . CRANIOTOMY Left 01/27/2020   Procedure: Left Craniotomy for Tumor Resection;  Surgeon: Cristina Part, MD;  Location: [place];  Service: Neurosurgery;  Laterality:  Left;  . ENDOMETRIAL ABLATION    . ESOPHAGOGASTRODUODENOSCOPY  10/2019  . LAPAROSCOPY N/A 01/19/2020   Procedure: LAPAROSCOPY DIAGNOSTIC LAPAROTOMY WITH SMALL BOWEL RESECTION;  Surgeon: Cristina Roup, MD;  Location: [facility];  Service: General;  Laterality: N/A;  . PELVIC LAPAROSCOPY  1999   left salpingectomy, excision of Right, paratubal cyst  . PELVIC LAPAROSCOPY     laparoscopy with lysis of pelvic adhesions  . SHOULDER SURGERY  11/2010   "FROZEN SHOULDER"  . TOTAL HIP ARTHROPLASTY Left August 31, 2019   Procedure: LEFT TOTAL HIP ARTHROPLASTY ANTERIOR APPROACH;  Surgeon: Cristina Nakayama, MD;  Location: [facility];  Service: Orthopedics;  Laterality: Left;  . TUBAL LIGATION    . UPPER GASTROINTESTINAL ENDOSCOPY    . VENTRICULOSTOMY Left 01/27/2020   Procedure: VENTRICULOSTOMY;  Surgeon: Cristina Part, MD;  Location: [facility];  Service: Neurosurgery;  Laterality: Left;    Assessment & Plan Clinical Impression: Cristina Conner is a 59 year old female with history of L-THR, anxiety d/o, N/V with small bowel inflammatory changes and recent capsule endoscopy with failure of capsule to pass thorough.  Follow-up CT abdomen showed resolution of inflammatory changes but patient continued to have intermittent nausea and vomiting with constipation, weakness and partial small bowel obstruction.  Due to concerns of stricture or adhesive disease, she was admitted on 01/18/2020 for hydration and underwent laparoscopy with labs SB resection and capsule removal on 05/18 by Dr. Dema Conner.  Postop has had issues with tachycardia as well as elevated BP.  She was found to have a wide-based festinating type gait used  intermittently with a tendency to use balance during physical therapy.  CT of head done revealing moderate hydrocephalus with mass-effect on left side of fourth ventricle with displacement to the right and possible mass lesion left posterior fossa displacing brachium pontis.  Cristina Conner was consulted for  input and she was transferred to [facility] for further work-up.  Patient did report having progressive headaches for 2 months with some decrease in hearing on the left.  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  She underwent left rectosigmoid craniotomy for resection of CP mass with placement of left occipital ventriculostomy by Cristina Conner on 05/27.  EVD removed on 05/31 as was no longer working and follow-up CT head showed improvement in hydrocephalus.  Nausea/vomiting has resolved with improvement in intake. ABLA being monitored. She continues to have issues with headaches, dizziness, diplopia as well as left foot weakness with unsteady gait.  STM deficits reported with problems processing as well as mild dysphagia.  CIR was recommended due to functional decline. Patient transferred to CIR on 02/03/2020 .   Patient currently requires mod with mobility secondary to muscle weakness, decreased cardiorespiratoy endurance, impaired timing and sequencing, unbalanced muscle activation and decreased coordination, diplopia, central origin and decreased standing balance, decreased postural control and decreased balance strategies.  Prior to hospitalization, patient was independent  with mobility and lived with Alone(daughter moving from [place] to live with patient on permanent basis) in a House home.  Home access is 2Stairs to enter.   Pt was active individual who exercised regularly and was employed [employer]ner time with [employer].  She enjoyed Zumba for exercise.  She has a supportive daughter who plans to permanantly move in w/pt to assist.  Her primary limiting factors are balance (BERG 21/56), endurance, gait function.  She is at a very high risk of falls and is a very good candidate for inpatient rehab.    Patient will benefit from skilled PT intervention to maximize safe  functional mobility, minimize fall risk and decrease caregiver burden for planned discharge home with 24 hour assist.  Anticipate patient will benefit from follow up OP at discharge.  PT - End of Session Activity Tolerance: Tolerates 30+ min activity with multiple rests Endurance Deficit: Yes PT Assessment Rehab Potential (ACUTE/IP ONLY): Good PT Patient demonstrates impairments in the following area(s): Balance;Endurance;Motor;Safety;Skin Integrity PT Transfers Functional Problem(s): Bed Mobility;Bed to Chair;Car;Furniture;Floor PT Locomotion Functional Problem(s): Ambulation;Stairs PT Plan PT Intensity: Minimum of 1-2 x/day ,45 to 90 minutes PT Frequency: 5 out of 7 days PT Duration Estimated Length of Stay: 7-9 days PT Treatment/Interventions: Ambulation/gait training;Discharge planning;Functional mobility training;Psychosocial support;Therapeutic Activities;Balance/vestibular training;Neuromuscular re-education;Therapeutic Exercise;Cognitive remediation/compensation;DME/adaptive equipment instruction;UE/LE Strength taining/ROM;Community reintegration;Patient/family education;Stair training;UE/LE Coordination activities PT Transfers Anticipated Outcome(s): mod I w/LRAD PT Locomotion Anticipated Outcome(s): mod I w/LRAD PT Recommendation Follow Up Recommendations: Outpatient PT Patient destination: Home Equipment Recommended: To be determined  Skilled Therapeutic Intervention  Evaluation completed (see details above and below) with education on PT POC and goals and individual treatment initiated with focus on functional mobility/transfers, LE strength, dynamic standing balance/coordination, ambulation, stair navigation, simulated car transfers, and improved endurance with activity.   Pt oriented to unit and discussed pt goals.   Gait:  >169f w/RW and cga, wide base of support, decreased clearance RLE thru swing, inconsistent but decreased step length bilat but RLE >L, shuffling  gait. Stairs: ascends/descends 4 steps w/2 rails w/min assist, LE weakness evident  w/shaking in quads noted due to fatigue descending. Gait:  No AD, mod assist, post tendency, ataxic advancement LLE, very wide base, excessive lateral trunk excursions. Balance:  Berg balance assessment performed and score of 21/56 discussed w/patient, high risk falls education performed as well as need for AD at this time outside of PT rx.  Pt educated re: balance deficits as related to pathology/tumor and anticipated treatement interventions to address. Discussed specific deficits found in assessment. Pt fatigues easily and requires seated rest breaks between each mobility activity.    At end of eval and treatment pt transported back to room by therapist due to fatigue.  WC to be SPT w/min assist.  Pt then requested to use BR.  Due to time constraints, NT called and pt handed over to NT after discussing use of RW and careful guarding due to balance deficits.    PT Evaluation Precautions/Restrictions Precautions Precautions: Fall Precaution Comments: left leg weakness, imbalance, double vision (has corrective glasses) Restrictions Weight Bearing Restrictions: No General   Vital Signs Pain Pain Assessment Pain Scale: 0-10 Pain Score: 2  Pain Type: Acute pain Pain Location: Head Pain Orientation: Left Pain Descriptors / Indicators: Headache Home Living/Prior Functioning Home Living Available Help at Discharge: Family;Available 24 hours/day Type of Home: House Home Access: Stairs to enter [employer] of Steps: 2 Entrance Stairs-Rails: None Home Layout: One level Biochemist, clinical: Standard Bathroom Accessibility: Yes Additional Comments: daughter will help when she goes home. Daughter is speech therapist. Pt likes to [place].  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 Leisure: Hobbies-yes (Comment) Comments: Zumba Vision/Perception     Cognition Overall Cognitive Status: Impaired/Different from baseline Arousal/Alertness: Awake/alert Orientation Level: Oriented X4 Attention: Sustained Sustained Attention: Appears intact Sustained Attention Impairment: Functional basic Memory: Impaired Memory Impairment: Decreased recall of new information;Retrieval deficit;Decreased short term memory Decreased Short Term Memory: Verbal complex;Functional complex Problem Solving: Impaired Problem Solving Impairment: Functional complex Safety/Judgment: Appears intact Sensation Sensation Light Touch: Appears Intact Proprioception: Appears Intact Coordination Gross Motor Movements are Fluid and Coordinated: No Fine Motor Movements are Fluid and Coordinated: No Finger Nose Finger Test: dysmetrica L >R Heel Shin Test: significant dysmetria LLE Motor  Motor Motor - Skilled Clinical Observations: baseline mild treamor that pt states was premorbid and attributes to Prozac.  Significant dysmetriat LUE and LLE  Mobility Bed Mobility Bed Mobility: Rolling Right;Rolling Left;Supine to Sit;Sitting - Scoot to [employer] of Bed;Sit to Supine Rolling Right: Independent Rolling Left: Independent Right Sidelying to Sit: Independent with assistive device Supine to Sit: Independent with assistive device Sitting - Scoot to Edge of Bed: Supervision/Verbal cueing Sit to Supine: Independent with assistive device Transfers Transfers: Sit to Stand;Stand to Sit;Stand Pivot Transfers Sit to Stand: Contact Guard/Touching assist Stand to Sit: Minimal Assistance - Patient > 75% Stand Pivot Transfers: Minimal Assistance - Patient > 75% Stand Pivot Transfer Details  (indicate cue type and reason): poor balance w/transition, experiences post LOB w/backing Transfer (Assistive device): None Locomotion  Gait Ambulation: Yes Gait Assistance: Moderate Assistance - Patient 50-74% Gait Distance (Feet): 25 Feet Assistive device: Rolling walker Gait Assistance Details: shuffling gait tendency which increases w/fatigue.  decreased step length L vs R, decreased clearance L>R w/footflat IC, cadence and  step length vary, wide base of support, mild tendency to veer to R but self corrects. Gait Gait: Yes Gait Pattern: Impaired Gait velocity: shuffling gait tendency which increases w/fatigue.  decreased step length L vs R, decreased clearance L>R w/footflat IC, cadence and step length vary, wide base of support, mild tendency to veer to R but self corrects. High Level Ambulation High Level Ambulation: Backwards walking Backwards Walking: loses balance posteriorly w/all attempts, mod assist for recovery Stairs / Additional Locomotion Stairs: Yes Stairs Assistance: Minimal Assistance - Patient > 75% Stair Management Technique: Two rails Number of Stairs: 4 Height of Stairs: 5 Wheelchair Mobility Wheelchair Mobility: No  Trunk/Postural Assessment  Cervical Assessment Cervical Assessment: Within Functional Limits Thoracic Assessment Thoracic Assessment: Within Functional Limits Lumbar Assessment Lumbar Assessment: Within Functional Limits Postural Control Postural Control: Deficits on evaluation Righting Reactions: delayed/standing Protective Responses: delayed/standing  Balance Balance Balance Assessed: (P) Yes Standardized Balance Assessment Standardized Balance Assessment: Berg Balance Test Berg Balance Test Sit to Stand: Able to stand using hands after several tries Standing Unsupported: Able to stand 30 seconds unsupported Sitting with Back Unsupported but Feet Supported on Floor or Stool: Able to sit safely and securely 2 minutes Stand to Sit:  Controls descent by using hands Transfers: Needs one person to assist Standing Unsupported with Eyes Closed: Able to stand 10 seconds safely Standing Ubsupported with Feet Together: Needs help to attain position and unable to hold for 15 seconds From Standing, Reach Forward with Outstretched Arm: Can reach forward >5 cm safely (2") From Standing Position, Pick up Object from Floor: Unable to try/needs assist to keep balance From Standing Position, Turn to Look Behind Over each Shoulder: Looks behind one side only/other side shows less weight shift Turn 360 Degrees: Needs assistance while turning Standing Unsupported, Alternately Place Feet on Step/Stool: Needs assistance to keep from falling or unable to try Standing Unsupported, One Foot in Front: Loses balance while stepping or standing Standing on One Leg: Unable to try or needs assist to prevent fall Total Score: 21 Extremity Assessment  RUE Assessment RUE Assessment: Within Functional Limits General Strength Comments: see OT eval for details LUE Assessment LUE Assessment: Within Functional Limits General Strength Comments: see OT eval for details, coordination primary deficit RLE Assessment RLE Assessment: Within Functional Limits General Strength Comments: overall mild decrease proximally LLE Assessment LLE Assessment: Exceptions to [facility] Passive Range of Motion (PROM) Comments: WNL Active Range of Motion (AROM) Comments: WNL General Strength Comments: hip grossly 3+/5 w/pain w/resistance, ankle DF 4+/5    Refer to Care Plan for Long Term Goals  Recommendations for other services: None   Discharge Criteria: Patient will be discharged from PT if patient refuses treatment 3 consecutive times without medical reason, if treatment goals not met, if there is a change in medical status, if patient makes no progress towards goals or if patient is discharged from hospital.  The above assessment, treatment plan, treatment alternatives  and goals were discussed and mutually agreed upon: by patient  Jerrilyn Cairo 02/04/2020, 12:38 PM

## 2020-02-04 NOTE — IPOC Note (Addendum)
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[place]) Active Problems:   Hypoalbuminemia due to protein-calorie malnutrition ([facility])   Acute blood loss anemia   Sinus tachycardia   Postoperative pain     Functional Problem List: Nursing Pain, Safety, Motor, Sensory, Medication Management, Endurance, Skin Integrity  PT Balance, Endurance, Motor, Safety, Skin Integrity  OT Balance, Endurance, Motor, Safety, Sensory, Skin Integrity, Vision, Cognition  SLP Behavior, Nutrition  TR         Basic ADL's: OT Grooming, Bathing, Dressing, Toileting     Advanced  ADL's: OT       Transfers: PT Bed Mobility, Bed to Chair, Car, [employer], Futures trader, Metallurgist: PT Ambulation, Stairs     Additional Impairments: OT Fuctional Use of Upper Extremity  SLP Swallowing, Communication, Social Cognition expression Problem Solving, Memory  TR      Anticipated Outcomes Item Anticipated Outcome  Self Feeding no goal  Swallowing  Mod I   Basic self-care  Setup  Toileting  setup toilet; S shower   Bathroom Transfers setup toilet; S shower  Bowel/Bladder  patient will have elimination needs met with mod I assistance  Transfers  mod I w/LRAD  Locomotion  mod I w/LRAD  Communication  Mod I  Cognition  Mod I  Pain  Patients pain will be managed within an acceptable level while on CIR  Safety/Judgment  Patient will have no falls with injury while on CIR   Therapy Plan: PT Intensity: Minimum of 1-2 x/day ,45 to 90 minutes PT Frequency: 5 out of 7 days PT Duration Estimated Length of Stay: 7-9 days OT Intensity: Minimum of 1-2 x/day, 45 to 90 minutes OT Frequency: 5 out of 7 days OT Duration/Estimated Length of Stay: 7-10 SLP Intensity: Minumum of 1-2 x/day, 30 to 90  minutes SLP Frequency: 3 to 5 out of 7 days SLP Duration/Estimated Length of Stay: 10-14 days   Due to the current state of emergency, patients may not be receiving their 3-hours of Medicare-mandated therapy.   Team Interventions: Nursing Interventions Patient/Family Education, Pain Management, Medication Management, Dysphagia/Aspiration Precaution Training, Skin Care/Wound Management, Psychosocial Support  PT interventions Ambulation/gait training, Discharge planning, Functional mobility training, Psychosocial support, Therapeutic Activities, Balance/vestibular training, Neuromuscular re-education, Therapeutic Exercise, Cognitive remediation/compensation, DME/adaptive equipment instruction, UE/LE Strength taining/ROM, Community reintegration, Barrister's clerk education, IT trainer, UE/LE Coordination activities  OT Interventions Training and development officer, Discharge planning, Pain management, Therapeutic Activities, Self Care/advanced ADL retraining, UE/LE Coordination activities, Visual/perceptual remediation/compensation, Therapeutic Exercise, Skin care/wound managment, Patient/family education, Functional mobility training, Disease mangement/prevention, Cognitive remediation/compensation, Academic librarian, Engineer, drilling, Neuromuscular re-education, Psychosocial support, Splinting/orthotics, UE/LE Strength taining/ROM, Wheelchair propulsion/positioning  SLP Interventions Cognitive remediation/compensation, Dysphagia/aspiration precaution training, Internal/external aids, Speech/Language facilitation, Therapeutic Activities, Environmental controls, Cueing hierarchy, Functional tasks, Patient/family education  TR Interventions    SW/CM Interventions     Barriers to Discharge MD  Medical stability  Nursing      PT      OT      SLP      SW       Team Discharge Planning: Destination: PT-Home ,OT- Home , SLP-Home Projected Follow-up: PT-Outpatient PT, OT-   Outpatient OT, SLP-(TBD) Projected Equipment Needs: PT-To be determined, OT- Tub/shower seat, SLP-None recommended by SLP Equipment Details: PT- , OT-  Patient/family involved in discharge planning: PT- Patient,  OT-Patient, SLP-Patient  MD ELOS: 9-11 days Medical Rehab Prognosis:  Excellent Assessment: The patient has been admitted for CIR therapies with the diagnosis of vestibular schwannoma. The team will be addressing functional mobility, strength, stamina, balance, safety, adaptive techniques and equipment, self-care, bowel and bladder mgt, patient and caregiver education, NMR, visual-spatial awareness, pain mgt, cognition and communication . Goals have been set at mod I.   Due to the current state of emergency, patients may not be receiving their 3 hours per day of Medicare-mandated therapy.    Meredith Staggers, MD, FAAPMR      See Team Conference Notes for weekly updates to the plan of care

## 2020-02-04 NOTE — Evaluation (Signed)
Speech Language Pathology Assessment and Plan  Patient Details  Name: Cristina Conner MRN: [id number] Date of Birth: [birth year]  SLP Diagnosis: Dysarthria;Cognitive Impairments;Dysphagia  Rehab Potential: Excellent ELOS: 10-14 days    Today's Date: 02/04/2020  Session 1: SLP Individual Time: 7564-3329 SLP Individual Time Calculation (min): 30 min   Session 2: SLP Individual Time: 5188-4166 SLP Individual Time Calculation (min): 25 min   Problem List:  Patient Active Problem List   Diagnosis Date Noted  . 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 Protein-calorie malnutrition, severe 01/30/2020  . Brain tumor ([place]) 01/27/2020  . Low folate 01/23/2020  . Anemia of chronic disease 01/23/2020  . Small bowel obstruction from retained endoscopy capsule at stricture s/p ileal resection 01/19/2020 01/18/2020  . 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 Primary localized osteoarthritis of left hip 08-23-19  . Death of family member-  husband passed Jul 2020 04/29/2019  . 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 Sleep difficulties 09/09/2018  . Pre-diabetes 03/09/2018  . Postmenopausal state- went thru early 40's 01/02/2018  . Primary osteoarthritis of left hip 01/02/2018  . Caregiver burden- for husband with ALLeukemia 01/02/2018  . Left hip pain 01/02/2018  . Osteopenia 07/31/2016  . Hypertension 05/20/2011  . Menopausal state 05/20/2011   Past Medical History:  Past Medical History:  Diagnosis Date  . Anxiety   . Arthritis   . Diverticulosis   . GERD (gastroesophageal reflux disease)   . Hypertension   . Osteopenia   . Post-operative nausea and vomiting    vomiting after general anesthesia per pt.  . Pre-diabetes    Past Surgical History:  Past Surgical History:  Procedure Laterality Date  . CESAREAN SECTION     x 2  . COLONOSCOPY    . CRANIOTOMY Left 01/27/2020   Procedure: Left Craniotomy for Tumor Resection;   Surgeon: Judith Part, MD;  Location: [facility];  Service: Neurosurgery;  Laterality: Left;  . ENDOMETRIAL ABLATION    . ESOPHAGOGASTRODUODENOSCOPY  10/2019  . LAPAROSCOPY N/A 01/19/2020   Procedure: LAPAROSCOPY DIAGNOSTIC LAPAROTOMY WITH SMALL BOWEL RESECTION;  Surgeon: Ileana Roup, MD;  Location: [facility];  Service: General;  Laterality: N/A;  . PELVIC LAPAROSCOPY  1999   left salpingectomy, excision of Right, paratubal cyst  . PELVIC LAPAROSCOPY     laparoscopy with lysis of pelvic adhesions  . SHOULDER SURGERY  11/2010   "FROZEN SHOULDER"  . TOTAL HIP ARTHROPLASTY Left 23-Aug-2019   Procedure: LEFT TOTAL HIP ARTHROPLASTY ANTERIOR APPROACH;  Surgeon: Melrose Nakayama, MD;  Location: [facility];  Service: Orthopedics;  Laterality: Left;  . TUBAL LIGATION    . UPPER GASTROINTESTINAL ENDOSCOPY    . VENTRICULOSTOMY Left 01/27/2020   Procedure: VENTRICULOSTOMY;  Surgeon: Judith Part, MD;  Location: [facility];  Service: Neurosurgery;  Laterality: Left;    Assessment / Plan / Recommendation Clinical Impression Patient is a 59 year old female with history of L-THR, anxiety d/o, N/V with small bowel inflammatory changes and recent capsule endoscopy with failure of capsule to pass thorough.  Follow-up CT abdomen showed resolution of inflammatory changes but patient continued to have intermittent nausea and vomiting with constipation, weakness and partial small bowel obstruction.  Due to concerns of stricture or adhesive disease, she was admitted on 01/18/2020 for hydration and underwent laparoscopy with labs SB resection and capsule removal on 05/18 by Dr. Dema Severin.  Postop has had issues with tachycardia as well as elevated BP.  She  was found to have a wide-based festinating type gait used intermittently with a tendency to lose balance during physical therapy.  CT of head done revealing moderate hydrocephalus with mass-effect on left side of fourth ventricle with displacement to the right and  possible mass lesion left posterior fossa displacing brachium pontis. Dr. Trenton Gammon was consulted for input and she was transferred to [facility] for further work-up.  Patient did report having progressive headaches for 2 months with some decrease in hearing on the left.  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 She underwent left rectosigmoid craniotomy for resection of CP mass with placement of left occipital ventriculostomy by Dr. Venetia Constable on 05/27.  EVD removed on 05/31 as was no longer working and follow-up CT head showed improvement in hydrocephalus.  Nausea/vomiting has resolved with improvement in intake. ABLA being monitored. She continues to have issues with headaches, dizziness, diplopia as well as left foot weakness with unsteady gait.  STM deficits reported with problems processing as well as mild dysphagia.  CIR was recommended due to functional decline. Patient admitted 02/03/20.  Session 1: Patient assessed with breakfast meal of dys. 3 textures with thin liquids. Patient demonstrated mildly prolonged but efficient mastication with solid textures with use of compensatory strategies of small bites, a slow rate and placing all solids on right side of the oral cavity due to moderate left facial, lingual and labial weakness. Patient self-monitored and corrected mild left anterior spillage independently and required supervision verbal cues to assess potential left buccal pocketing. Patient also independently placed her straw on the right side of her oral cavity to maximize labial seal and consumed thin liquids without overt s/s of aspiration.Patient also observed taking medications whole with thin liquids without difficulty.  Recommend patient remain on Dys. 3 textures with thin liquids with only intermittent supervision due to independently utilizing swallowing  compensatory strategies and medications can be administered one at a time while whole with thin liquids. Patient would benefit from skilled SLP intervention to maximize her swallowing function prior to discharge.   Session 2: Patient was administered the Cognistat and scored WFL on all subtests with the exception of mild impairments in visual construction. However, mild impairments in higher-level problem solving and recall were noted throughout a functional conversation and informal assessment. Patient also endorses "brain fog" and having difficulty "putting everything together."  Patient also demonstrated a mild dysarthria characterized by imprecise consonants from left facial, lingual and labial weakness resulting in mildly decreased intelligibility at the conversation level.   Patient would benefit from skilled SLP intervention to maximize her cognitive functioning and overall speech intelligibility prior to discharge.   Patient verbalized understanding of recommendations and goals of skilled SLP intervention.    Skilled Therapeutic Interventions          Session 1: Administered a BSE, please see above for details.  Session 2: Administered a cognitive-linguistic evaluation, please see above for details.    SLP Assessment  Patient will need skilled Speech Lanaguage Pathology Services during CIR admission    Recommendations  SLP Diet Recommendations: Dysphagia 3 (Mech soft);Thin Liquid Administration via: Straw Medication Administration: Whole meds with liquid(one at a time) Supervision: Patient able to self feed;Intermittent supervision to cue for compensatory strategies Compensations: Minimize environmental distractions;Slow rate;Small sips/bites;Lingual sweep for clearance of pocketing;Monitor for anterior loss Postural Changes and/or Swallow Maneuvers: Seated upright 90 degrees Oral Care Recommendations: Oral care BID Recommendations  for Other Services: Neuropsych consult Patient  destination: Home Follow up Recommendations: (TBD) Equipment Recommended: None recommended by SLP    SLP Frequency 3 to 5 out of 7 days   SLP Duration  SLP Intensity  SLP Treatment/Interventions 10-14 days  Minumum of 1-2 x/day, 30 to 90 minutes  Cognitive remediation/compensation;Dysphagia/aspiration precaution training;Internal/external aids;Speech/Language facilitation;Therapeutic Activities;Environmental controls;Cueing hierarchy;Functional tasks;Patient/family education    Pain No/Denies Pain  SLP Evaluation Cognition Overall Cognitive Status: Impaired/Different from baseline Arousal/Alertness: Awake/alert Orientation Level: Oriented X4 Sustained Attention: Appears intact Memory: Impaired Memory Impairment: Decreased recall of new information;Retrieval deficit;Decreased short term memory Decreased Short Term Memory: Verbal complex;Functional complex Problem Solving: Impaired Problem Solving Impairment: Functional complex Safety/Judgment: Appears intact  Comprehension Auditory Comprehension Overall Auditory Comprehension: Appears within functional limits for tasks assessed Visual Recognition/Discrimination Discrimination: Not tested Reading Comprehension Reading Status: Not tested Expression Expression Primary Mode of Expression: Verbal Verbal Expression Overall Verbal Expression: Appears within functional limits for tasks assessed Written Expression Dominant Hand: Right Written Expression: Not tested Oral Motor Oral Motor/Sensory Function Overall Oral Motor/Sensory Function: Moderate impairment Facial ROM: Reduced left;Suspected CN VII (facial) dysfunction Facial Symmetry: Abnormal symmetry left;Suspected CN VII (facial) dysfunction Facial Strength: Reduced left;Suspected CN VII (facial) dysfunction Facial Sensation: Suspected CN V (Trigeminal) dysfunction;Reduced left Lingual ROM: Reduced right;Suspected CN XII (hypoglossal) dysfunction Lingual Symmetry:  Suspected CN XII (hypoglossal) dysfunction;Abnormal symmetry left Lingual Strength: Reduced Mandible: Impaired Motor Speech Overall Motor Speech: Impaired Respiration: Within functional limits Phonation: Normal Articulation: Impaired Level of Impairment: Word Intelligibility: Intelligibility reduced Word: 75-100% accurate Phrase: 75-100% accurate Sentence: 75-100% accurate Conversation: 75-100% accurate Motor Planning: Witnin functional limits Effective Techniques: Over-articulate  Bedside Swallowing Assessment General Date of Onset: 01/18/20 Previous Swallow Assessment: BSE on 5/28: Recommended to remain NPO, however, has since been upgraded to Dys. 3 textures with thin liquids Diet Prior to this Study: Dysphagia 3 (soft);Thin liquids Respiratory Status: Room air Behavior/Cognition: Alert;Cooperative;Pleasant mood Oral Cavity - Dentition: Adequate natural dentition Self-Feeding Abilities: Able to feed self Patient Positioning: Upright in chair/Tumbleform Baseline Vocal Quality: Normal Volitional Cough: Strong Volitional Swallow: Able to elicit  Ice Chips Ice chips: Not tested Thin Liquid Thin Liquid: Impaired Presentation: Straw Oral Phase Impairments: Reduced labial seal Pharyngeal  Phase Impairments: Multiple swallows Nectar Thick Nectar Thick Liquid: Not tested Honey Thick Honey Thick Liquid: Not tested Puree Puree: Impaired Presentation: Spoon Oral Phase Impairments: Reduced labial seal Solid Solid: Impaired Presentation: Spoon;Self Fed Oral Phase Impairments: Reduced labial seal;Impaired mastication Oral Phase Functional Implications: Left lateral sulci pocketing BSE Assessment Risk for Aspiration Impact on safety and function: Mild aspiration risk  Short Term Goals: Week 1: SLP Short Term Goal 1 (Week 1): Patient will consume trials of regular textures with efficient mastication and complete oral clearance X 2 sessions without overt s/s of aspiration and  supervision level verbal cues for use of compensatory strateiges prior to upgrade. SLP Short Term Goal 2 (Week 1): Patient will utilize speech intelligibility strategies to achieve 100% intelligibility at the conversation level with Mod I. SLP Short Term Goal 3 (Week 1): Patient will demonstrate complex problem solving for functional tasks with supervision verbal cues. SLP Short Term Goal 4 (Week 1): Patient will recall new, functional information with supervision verbal and visual cues.  Refer to Care Plan for Long Term Goals  Recommendations for other services: Neuropsych  Discharge Criteria: Patient will be discharged from SLP if patient refuses treatment 3 consecutive times without medical reason, if treatment goals not met, if there is a change  in medical status, if patient makes no progress towards goals or if patient is discharged from hospital.  The above assessment, treatment plan, treatment alternatives and goals were discussed and mutually agreed upon: by patient  ,  02/04/2020, 9:04 AM

## 2020-02-04 NOTE — Evaluation (Signed)
Occupational Therapy Assessment and Plan  Patient Details  Name: Cristina Conner MRN: [id number] Date of Birth: 1960-10-20  OT Diagnosis: abnormal posture, acute pain, apraxia, ataxia, disturbance of vision, muscle weakness (generalized) and coordination disorder Rehab Potential:   ELOS: 7-10   Today's Date: 02/04/2020 OT Individual Time: 6812-7517 OT Individual Time Calculation (min): 75 min     Problem List:  Patient Active Problem List   Diagnosis Date Noted  . 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 Protein-calorie malnutrition, severe 01/30/2020  . Brain tumor ([place]) 01/27/2020  . Low folate 01/23/2020  . Anemia of chronic disease 01/23/2020  . Small bowel obstruction from retained endoscopy capsule at stricture s/p ileal resection 01/19/2020 01/18/2020  . 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 Primary localized osteoarthritis of left hip 2019-08-19  . Death of family member-  husband passed Jul 2020 04/29/2019  . 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 Sleep difficulties 09/09/2018  . Pre-diabetes 03/09/2018  . Postmenopausal state- went thru early 40's 01/02/2018  . Primary osteoarthritis of left hip 01/02/2018  . Caregiver burden- for husband with ALLeukemia 01/02/2018  . Left hip pain 01/02/2018  . Osteopenia 07/31/2016  . Hypertension 05/20/2011  . Menopausal state 05/20/2011    Past Medical History:  Past Medical History:  Diagnosis Date  . Anxiety   . Arthritis   . Diverticulosis   . GERD (gastroesophageal reflux disease)   . Hypertension   . Osteopenia   . Post-operative nausea and vomiting    vomiting after general anesthesia per pt.  . Pre-diabetes    Past Surgical History:  Past Surgical History:  Procedure Laterality Date  . CESAREAN SECTION     x 2  . COLONOSCOPY    . CRANIOTOMY Left 01/27/2020   Procedure: Left Craniotomy for Tumor Resection;  Surgeon: Judith Part, MD;  Location: [facility];  Service: Neurosurgery;  Laterality: Left;  . ENDOMETRIAL ABLATION    . ESOPHAGOGASTRODUODENOSCOPY  10/2019  . LAPAROSCOPY N/A 01/19/2020   Procedure: LAPAROSCOPY DIAGNOSTIC LAPAROTOMY WITH SMALL BOWEL RESECTION;  Surgeon: Ileana Roup, MD;  Location: [facility];  Service: General;  Laterality: N/A;  . PELVIC LAPAROSCOPY  1999   left salpingectomy, excision of Right, paratubal cyst  . PELVIC LAPAROSCOPY     laparoscopy with lysis of pelvic adhesions  . SHOULDER SURGERY  11/2010   "FROZEN SHOULDER"  . TOTAL HIP ARTHROPLASTY Left 2019-08-19   Procedure: LEFT TOTAL HIP ARTHROPLASTY ANTERIOR APPROACH;  Surgeon: Melrose Nakayama, MD;  Location: [facility];  Service: Orthopedics;  Laterality: Left;  . TUBAL LIGATION    . UPPER GASTROINTESTINAL ENDOSCOPY    . VENTRICULOSTOMY Left 01/27/2020   Procedure: VENTRICULOSTOMY;  Surgeon: Judith Part, MD;  Location: [facility];  Service: Neurosurgery;  Laterality: Left;    Assessment & Plan Clinical Impression: Cristina Conner is a 59 year old female with history of L-THR, anxiety d/o, N/V with small bowel inflammatory changes and recent capsule endoscopy with failure of capsule to pass thorough.Follow-up CT abdomen showed resolution of inflammatory changes but patient continued to have intermittent nausea and vomiting with constipation, weakness and partial small bowel obstruction. Due to concerns of stricture or adhesive disease, she was admitted on 01/18/2020 for hydration and underwent laparoscopy with labs SB resection and capsule removal on 05/18 by Dr. Dema Severin. Postop has had issues with tachycardia as well as elevated BP. She was found to have a wide-based festinating type gait used  intermittently with a tendency to use balance during physical therapy. CT of head done revealing moderate hydrocephalus with mass-effect on left side of fourth ventricle with displacement to the right and possible mass lesion left posterior fossa displacing brachium  pontis.  Dr. Trenton Gammon was consulted for input and she was transferred to [facility] for further work-up. Patient did report having progressive headaches for 2 months with some decrease in hearing on the left. 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 She underwent left rectosigmoid craniotomy for resection of CP mass with placement of left occipital ventriculostomy by Dr. Venetia Constable on 05/27. EVD removed on 05/31 as was no longer working and follow-up CT head showed improvement in hydrocephalus. Nausea/vomiting has resolvedwith improvement in intake. ABLA being monitored. She continues to have issues with headaches, dizziness, diplopiaas well as left foot weakness with unsteady gait. STM deficits reported with problems processing as well as mild dysphagia. CIR was recommended due to functional decline. Patient transferred to CIR on 02/03/2020 .     Patient currently requires min with basic self-care skills secondary to muscle weakness, decreased cardiorespiratoy endurance, impaired timing and sequencing, ataxia, decreased coordination and decreased motor planning, decreased visual acuity, decreased visual perceptual skills and decreased visual motor skills, decreased problem solving and decreased safety awareness and decreased standing balance, decreased postural control, hemiplegia and decreased balance strategies.  Prior to hospitalization, patient could complete BADL/iADL with independent .  Patient will benefit from skilled intervention to increase independence with basic self-care skills prior to discharge home with care partner.  Anticipate patient will require 24 hour supervision and follow up outpatient.  OT - End of Session Endurance Deficit: Yes OT Assessment Rehab Potential (ACUTE ONLY): Good OT Patient demonstrates impairments in the following  area(s): Balance;Endurance;Motor;Safety;Sensory;Skin Integrity;Vision;Cognition OT Basic ADL's Functional Problem(s): Grooming;Bathing;Dressing;Toileting OT Transfers Functional Problem(s): Toilet;Tub/Shower OT Additional Impairment(s): Fuctional Use of Upper Extremity OT Plan OT Intensity: Minimum of 1-2 x/day, 45 to 90 minutes OT Frequency: 5 out of 7 days OT Duration/Estimated Length of Stay: 7-10 OT Treatment/Interventions: Balance/vestibular training;Discharge planning;Pain management;Therapeutic Activities;Self Care/advanced ADL retraining;UE/LE Coordination activities;Visual/perceptual remediation/compensation;Therapeutic Exercise;Skin care/wound managment;Patient/family education;Functional mobility training;Disease mangement/prevention;Cognitive remediation/compensation;Community reintegration;DME/adaptive equipment instruction;Neuromuscular re-education;Psychosocial support;Splinting/orthotics;UE/LE Strength taining/ROM;Wheelchair propulsion/positioning OT Self Feeding Anticipated Outcome(s): no goal OT Basic Self-Care Anticipated Outcome(s): Setup OT Toileting Anticipated Outcome(s): setup toilet; S shower OT Bathroom Transfers Anticipated Outcome(s): setup toilet; S shower OT Recommendation Recommendations for Other Services: Therapeutic Recreation consult Therapeutic Recreation Interventions: Stress management;Kitchen group;Outing/community reintergration Patient destination: Home Follow Up Recommendations: Outpatient OT Equipment Recommended: Tub/shower seat   Skilled Therapeutic Intervention 1:1. Pt received in bed agreealbe ot OT after edu re OT role/purpose, CIR, ELOS and POC. Pt completes all mobility with RW and MIN A fo rbalance with mild ataxia and scissoring of LEs and VC for RW management. Pt completes all ADLs at sit to stand level with MIN A for balance only. See below for details. 9HPT, dynamometer and attempted further vision assesssment. However by end of session pt  with increased HA (RN alerted) and L eye sensitive to light. Pt unable to tolerate formal vision assessment. Pt biggest deficits in coordination, dexterity and safety with RW. Pt will do well in IPR to reach set up/MOD I goals.  OT Evaluation Precautions/Restrictions  Precautions Precautions: Fall Precaution Comments: left leg weakness, imbalance, double vision (has corrective glasses) Restrictions Weight Bearing Restrictions: No General Chart Reviewed: Yes Family/Caregiver Present: No Vital Signs  Pain Pain Assessment Pain Scale: 0-10 Pain Score: 7  Pain Type: Acute pain Pain Location: Eye Pain Orientation: Left Pain Descriptors / Indicators: Headache Pain Intervention(s): Medication (See eMAR) Home Living/Prior Functioning Home Living Living Arrangements: Alone Available Help at Discharge: Family, Available 24 hours/day Type of Home: House Home Access: Stairs to enter Technical brewer of Steps: 2 Entrance Stairs-Rails: None Home Layout: One level Bathroom Shower/Tub: Multimedia programmer: Standard Bathroom Accessibility: Yes Additional Comments: daughter will help when she goes home. Daughter is speech therapist. Pt likes to [place].  Pt works at [employer]  Lives With: Alone(daughter moving from [place] to live with patient on permanent basis) IADL History Homemaking Responsibilities: No Prior Function Level of Independence: Independent with basic ADLs, Independent with gait, Independent with homemaking with ambulation, Independent with transfers  Able to Take Stairs?: Yes Driving: Yes Vocation: Part time employment Vocation Requirements: Marine scientist Leisure: Hobbies-yes (Comment) Comments: Zumba ADL ADL Eating: Supervision/safety Grooming: Minimal assistance Where Assessed-Grooming: Standing at sink Upper Body Bathing: Supervision/safety Where Assessed-Upper Body Bathing: Shower Lower Body Bathing: Minimal assistance Where  Assessed-Lower Body Bathing: Shower Upper Body Dressing: Supervision/safety Where Assessed-Upper Body Dressing: Sitting at sink Lower Body Dressing: Minimal assistance Where Assessed-Lower Body Dressing: Sitting at sink, Standing at sink Toileting: Minimal assistance Where Assessed-Toileting: Glass blower/designer: Psychiatric nurse Method: Counselling psychologist: Emergency planning/management officer Transfer: Environmental education officer Method: Heritage manager: Grab bars, Radio broadcast assistant Vision Baseline Vision/History: Wears glasses Wears Glasses: At all times Patient Visual Report: Diplopia Vision Assessment?: Vision impaired- to be further tested in functional context Eye Alignment: Impaired (comment) Ocular Range of Motion: Restricted on the left;Restricted looking up Perception  Perception: Within Functional Limits Praxis Praxis: Intact Cognition Overall Cognitive Status: Impaired/Different from baseline Arousal/Alertness: Awake/alert Orientation Level: Person;Place;Situation Person: Oriented Place: Oriented Situation: Oriented Year: 2021 Month: June Day of Week: Correct Memory: Impaired Memory Impairment: Decreased recall of new information;Retrieval deficit;Decreased short term memory Decreased Short Term Memory: Verbal complex;Functional complex Immediate Memory Recall: Sock;Blue;Bed Memory Recall Sock: With Cue Memory Recall Blue: Without Cue Memory Recall Bed: Without Cue Attention: Sustained Sustained Attention: Appears intact Sustained Attention Impairment: Functional basic Problem Solving: Impaired Problem Solving Impairment: Functional complex Safety/Judgment: Appears intact Comments: slow processnig Sensation Sensation Light Touch: Appears Intact Proprioception: Appears Intact Coordination Gross Motor Movements are Fluid and Coordinated: No Fine Motor Movements are Fluid and Coordinated:  No Finger Nose Finger Test: dysmetrica L >R Heel Shin Test: significant dysmetria LLE 9 Hole Peg Test: LUE- 42.9 RUE-28.4 Motor  Motor Motor - Skilled Clinical Observations: baseline mild treamor that pt states was premorbid and attributes to Prozac.  Significant dysmetriat LUE and LLE Mobility  Bed Mobility Bed Mobility: Rolling Right;Rolling Left;Supine to Sit;Sitting - Scoot to [employer] of Bed;Sit to Supine Rolling Right: Independent Rolling Left: Independent Right Sidelying to Sit: Independent with assistive device Supine to Sit: Independent with assistive device Sitting - Scoot to Edge of Bed: Supervision/Verbal cueing Sit to Supine: Independent with assistive device Transfers Sit to Stand: Minimal Assistance - Patient > 75% Stand to Sit: Minimal Assistance - Patient > 75%  Trunk/Postural Assessment  Cervical Assessment Cervical Assessment: Within Functional Limits Thoracic Assessment Thoracic Assessment: Within Functional Limits Lumbar Assessment Lumbar Assessment: Within Functional Limits Postural Control Postural Control: Deficits on evaluation Righting Reactions: delayed/standing Protective Responses: delayed/standing  Balance Balance Balance Assessed: (P) Yes Standardized Balance Assessment Standardized Balance Assessment: Oceanographer Test [employer]  Test Sit to Stand: Able to stand using hands after several tries Standing Unsupported: Able to stand 30 seconds unsupported Sitting with Back Unsupported but Feet Supported on Floor or Stool: Able to sit safely and securely 2 minutes Stand to Sit: Controls descent by using hands Transfers: Needs one person to assist Standing Unsupported with Eyes Closed: Able to stand 10 seconds safely Standing Ubsupported with Feet Together: Needs help to attain position and unable to hold for 15 seconds From Standing, Reach Forward with Outstretched Arm: Can reach forward >5 cm safely (2") From Standing Position, Pick up Object from  Floor: Unable to try/needs assist to keep balance From Standing Position, Turn to Look Behind Over each Shoulder: Looks behind one side only/other side shows less weight shift Turn 360 Degrees: Needs assistance while turning Standing Unsupported, Alternately Place Feet on Step/Stool: Needs assistance to keep from falling or unable to try Standing Unsupported, One Foot in Front: Loses balance while stepping or standing Standing on One Leg: Unable to try or needs assist to prevent fall Total Score: 21 Extremity/Trunk Assessment RUE Assessment RUE Assessment: Within Functional Limits General Strength Comments: 54# dynamometer LUE Assessment LUE Assessment: Exceptions to [facility] General Strength Comments: 37# dynamometer, generalied weakness coordination primary deficit     Refer to Care Plan for Long Term Goals  Recommendations for other services: Therapeutic Recreation  Stress management and Outing/community reintegration   Discharge Criteria: Patient will be discharged from OT if patient refuses treatment 3 consecutive times without medical reason, if treatment goals not met, if there is a change in medical status, if patient makes no progress towards goals or if patient is discharged from hospital.  The above assessment, treatment plan, treatment alternatives and goals were discussed and mutually agreed upon: by patient  Tonny Branch 02/04/2020, 2:15 PM

## 2020-02-04 NOTE — Progress Notes (Signed)
[place] PHYSICAL MEDICINE & REHABILITATION PROGRESS NOTE   Subjective/Complaints: Had a pretty good night. Denies h/a, pain. Able to sleep. Working with SLP when I arrived  ROS: Patient denies fever, rash, sore throat, blurred vision, nausea, vomiting, diarrhea, cough, shortness of breath or chest pain, joint or back pain, headache, or mood change.    Objective:   DG HIP UNILAT WITH PELVIS 2-3 VIEWS LEFT  Result Date: 02/03/2020 CLINICAL DATA:  Left hip pain for 5 months EXAM: DG HIP (WITH OR WITHOUT PELVIS) 2-3V LEFT COMPARISON:  08/17/2019 FINDINGS: Frontal view of the pelvis as well as frontal and frogleg lateral views of the left hip are obtained. The left hip arthroplasty is in stable position with no evidence of complication. There are no acute displaced fractures. Right hip is unremarkable. Soft tissues are normal. IMPRESSION: 1. Unremarkable left hip arthroplasty. 2. No acute bony abnormality. Electronically Signed   By: Randa Ngo M.D.   On: 02/03/2020 19:17   Recent Labs    02/04/20 0630  WBC 10.1  HGB 8.2*  HCT 26.6*  PLT 516*   Recent Labs    02/04/20 0630  NA 138  K 4.1  CL 105  CO2 24  GLUCOSE 107*  BUN 11  CREATININE 0.81  CALCIUM 9.0   No intake or output data in the 24 hours ending 02/04/20 1110   Physical Exam: Vital Signs Blood pressure (!) 142/87, pulse (!) 102, temperature 98 F (36.7 C), resp. rate 18, height 5\' 6"  (1.676 m), weight 55.7 kg, last menstrual period 05/01/2009, SpO2 100 %.   General: Alert and oriented x 3, No apparent distress HEENT: Head is normocephalic, atraumatic, PERRLA, EOMI, sclera irritated/red, oral mucosa pink and moist, dentition intact, ext ear canals clear,  Neck: Supple without JVD or lymphadenopathy Heart: Reg rate and rhythm. No murmurs rubs or gallops Chest: CTA bilaterally without wheezes, rales, or rhonchi; no distress Abdomen: Soft, non-tender, non-distended, bowel sounds positive. Extremities: No  clubbing, cyanosis, or edema. Pulses are 2+ Skin: occipital incisions cdi with suture/staples Neuro: alert and oriented. Reasonable insight and awareness. Functional memory and language, speech generally intelligible. Left facial and brow droop. Cannot close left eye lid. Unable to abduct left eye fully. Sensory exam is normal. Reflexes are 2+ in all 4's. Fine motor coordination is intact. No tremors. Motor function is grossly 5/5.  Musculoskeletal: Full ROM, No pain with AROM or PROM in the neck, trunk, or extremities. Posture appropriate Psych: Pt's affect is appropriate. Pt is cooperative     Assessment/Plan: 1. 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  Physiatrist is providing close team supervision and 24 hour management of active medical problems listed below.  Physiatrist and rehab team continue to assess barriers to discharge/monitor patient progress toward functional and medical goals  Care Tool:  Bathing              Bathing assist       Upper Body Dressing/Undressing Upper body dressing   What is the patient wearing?: Hospital gown only    Upper body assist Assist Level: Minimal Assistance - Patient > 75%    Lower Body Dressing/Undressing Lower body dressing            Lower body assist       Toileting Toileting    Toileting assist Assist for toileting: Contact Guard/Touching assist     Transfers Chair/bed transfer  Transfers assist           Locomotion Ambulation   Ambulation assist              Walk 10 feet activity   Assist           Walk 50 feet activity   Assist           Walk 150 feet activity   Assist           Walk 10 feet on uneven surface  activity   Assist           Wheelchair     Assist               Wheelchair 50 feet with 2 turns activity    Assist             Wheelchair 150 feet activity     Assist          Blood pressure (!) 142/87, pulse (!) 102, temperature 98 F (36.7 C), resp. rate 18, height 5\' 6"  (1.676 m), weight 55.7 kg, last menstrual period 05/01/2009, SpO2 100 %.  Medical Problem List and Plan: 1.Impaired function due to vestibular schwannoma with impaired balance, gait and L hearing loss and dysphagia, and mild/moderate cognitive issues             -patient may shower            -ELOS/Goals: 10- 14 days- mod I to supervision 2.  Antithrombotics: -DVT/anticoagulation:  Pharmaceutical: Heparin             -antiplatelet therapy: N/a 3. Headaches/Pain Management: Worse at nights--continue hydrocodone prn.  Continue tylenol qid--decreased to 650 mg.  4. Mood: LCSW to follow for evaluation and support.  -ego support by team. Seems to be in good spirits at present  -on prozac and buspar for anxiety/depression. (off remeron currently)              -antipsychotic agents: N/A 5. Neuropsych: This patient is capable of making decisions on her own behalf. 6. Skin/Wound Care: Monitor for healing. Routine pressure relief measures.   -continue sutures/staples on scalp for now 7. Fluids/Electrolytes/Nutrition: Monitor I/O.   -intake improving  -protein supp for low albumin.  8. Left VII nerve injury: Continue eye drops 5 X day and patch at night/when asleep.  Discussed importance of keeping eye covered,lubricated  9. Resting tachycardia: Continue metoprolol and montinue for now. Multifactorial due to surgery, debility, anemia.   -monitor for activity tolerance--near 100bpm at rest 10. ABLA: Iron dextran 5/23--continue iron supplement.  -hgb 8.2 6/4 (trending up) 11. HTN: borderline control  -follow for pattern   13. Left hip pain s/p THR 12/20: Worse now--will order Xray to evaluate.  14. Small bowel RSXN: Continue Bentyl. Has been having BMs- stopped miralax (has been refusing) as colace bid effective.   -moved bowels  6/3 15. Vestibular dysfunction: N/V have resolved.   Vestibular evaluation ordered but treatment will not likely help given central origin of sx    LOS: 1 days A FACE TO FACE EVALUATION WAS PERFORMED  Meredith Staggers 02/04/2020, 11:10 AM

## 2020-02-04 NOTE — H&P (Signed)
Physical Medicine and Rehabilitation Admission H&P        Chief Complaint  Patient presents with  . Functional decline       HPI: Cristina Conner is a 59 year old female with history of L-THR, anxiety d/o, N/V with small bowel inflammatory changes and recent capsule endoscopy with failure of capsule to pass thorough.  Follow-up CT abdomen showed resolution of inflammatory changes but patient continued to have intermittent nausea and vomiting with constipation, weakness and partial small bowel obstruction.  Due to concerns of stricture or adhesive disease, she was admitted on 01/18/2020 for hydration and underwent laparoscopy with labs SB resection and capsule removal on 05/18 by Dr. Dema Severin.  Postop has had issues with tachycardia as well as elevated BP.  She was found to have a wide-based festinating type gait used intermittently with a tendency to use balance during physical therapy.  CT of head done revealing moderate hydrocephalus with mass-effect on left side of fourth ventricle with displacement to the right and possible mass lesion left posterior fossa displacing brachium pontis.   Dr. Trenton Gammon was consulted for input and she was transferred to [facility] for further work-up.  Patient did report having progressive headaches for 2 months with some decrease in hearing on the left.  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  She underwent left rectosigmoid craniotomy for resection of CP mass with placement of left occipital ventriculostomy by Dr. Venetia Constable on 05/27.  EVD removed on 05/31 as was no longer working and follow-up CT head showed improvement in hydrocephalus.  Nausea/vomiting has resolved with improvement in intake. ABLA being monitored. She continues to have issues with headaches, dizziness, diplopia as well as left foot weakness with unsteady  gait.  STM deficits reported with problems processing as well as mild dysphagia.  CIR was recommended due to functional decline.     Pt reports just had a BM just now in bathroom- voiding well with no purewick- going to [facility] vs toilet.    Still having double vision   Review of Systems  Constitutional: Negative for chills and fever.  HENT: Positive for hearing loss (on left). Negative for tinnitus.   Eyes: Positive for blurred vision (left eye) and double vision (without glasses). Negative for photophobia and pain.  Cardiovascular: Negative for chest pain and palpitations.  Gastrointestinal: Negative for constipation, heartburn and nausea.  Genitourinary: Negative for dysuria and urgency.  Musculoskeletal: Positive for joint pain (increase pain in left hip).  Skin: Negative for rash.  Neurological: Positive for dizziness (when looking upwards) and headaches.  Psychiatric/Behavioral: The patient does not have insomnia.   All other systems reviewed and are negative.         Past Medical History:  Diagnosis Date  . Anxiety    . Arthritis    . Diverticulosis    . GERD (gastroesophageal reflux disease)    . Hypertension    . Osteopenia    . Post-operative nausea and vomiting      vomiting after general anesthesia per pt.  . Pre-diabetes             Past Surgical History:  Procedure Laterality Date  . CESAREAN SECTION        x 2  . COLONOSCOPY      . CRANIOTOMY Left 01/27/2020    Procedure: Left Craniotomy for Tumor Resection;  Surgeon: Emelda Brothers  A, MD;  Location: [place];  Service: Neurosurgery;  Laterality: Left;  . ENDOMETRIAL ABLATION      . ESOPHAGOGASTRODUODENOSCOPY   10/2019  . LAPAROSCOPY N/A 01/19/2020    Procedure: LAPAROSCOPY DIAGNOSTIC LAPAROTOMY WITH SMALL BOWEL RESECTION;  Surgeon: Ileana Roup, MD;  Location: [facility];  Service: General;  Laterality: N/A;  . PELVIC LAPAROSCOPY   1999    left salpingectomy, excision of Right, paratubal cyst  . PELVIC  LAPAROSCOPY        laparoscopy with lysis of pelvic adhesions  . SHOULDER SURGERY   11/2010    "FROZEN SHOULDER"  . TOTAL HIP ARTHROPLASTY Left 08/17/2019    Procedure: LEFT TOTAL HIP ARTHROPLASTY ANTERIOR APPROACH;  Surgeon: Melrose Nakayama, MD;  Location: [facility];  Service: Orthopedics;  Laterality: Left;  . TUBAL LIGATION      . UPPER GASTROINTESTINAL ENDOSCOPY      . VENTRICULOSTOMY Left 01/27/2020    Procedure: VENTRICULOSTOMY;  Surgeon: Judith Part, MD;  Location: [place];  Service: Neurosurgery;  Laterality: Left;           Family History  Problem Relation Age of Onset  . Hypertension Mother    . Heart disease Mother    . Diabetes Mother    . Hypertension Father    . Colon cancer Neg Hx    . Esophageal cancer Neg Hx    . Stomach cancer Neg Hx    . Rectal cancer Neg Hx    . Colon polyps Neg Hx        Social History: Widowed last July--has been sedentary for the past year. 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 Daughter from [place] has moved in to help post surgery. She reports that she has never smoked. She has never used smokeless tobacco. She reports previous alcohol use. She reports that she does not use drugs.         Allergies  Allergen Reactions  . Hydrocodone Nausea And Vomiting            Medications Prior to Admission  Medication Sig Dispense Refill  . acetaminophen (TYLENOL) 500 MG tablet Take 1,000 mg by mouth as needed for moderate pain.      Marland Kitchen dicyclomine (BENTYL) 10 MG capsule Take 1 capsule (10 mg total) by mouth 3 (three) times daily before meals. 90 capsule 3  . eszopiclone (LUNESTA) 1 MG TABS tablet Take 1 mg by mouth at bedtime as needed for sleep.       . famotidine (PEPCID) 40 MG tablet Take 40 mg by mouth daily.       . Fe Fum-FePoly-Vit C-Vit B3 (INTEGRA) 62.5-62.5-40-3 MG CAPS Take 62.5 mg by mouth daily. 30 capsule 6  . FLUoxetine (PROZAC) 10 MG tablet Take 1 tablet (10 mg total) by mouth daily. 90 tablet 0    . ondansetron (ZOFRAN) 4 MG tablet Take 1 tablet (4 mg total) by mouth every 6 (six) hours as needed for nausea or vomiting. 50 tablet 4  . mirtazapine (REMERON) 7.5 MG tablet Take 1 tablet (7.5 mg total) by mouth at bedtime. (Patient not taking: Reported on 01/18/2020) 30 tablet 1  . ondansetron (ZOFRAN-ODT) 8 MG disintegrating tablet Take 1 tablet (8 mg total) by mouth every 8 (eight) hours as needed for nausea or refractory nausea / vomiting. (Patient not taking: Reported on 01/18/2020) 20 tablet 0      Drug Regimen Review  Drug regimen was reviewed and remains  appropriate with no significant issues identified   Home: Home Living Family/patient expects to be discharged to:: Private residence Living Arrangements: Alone Available Help at Discharge: Family Type of Home: House Home Access: Stairs to enter Technical brewer of Steps: 2 Dierks: One level Bathroom Shower/Tub: Multimedia programmer: [place]: Environmental consultant - 2 wheels, Bedside commode Additional Comments: daughter will help when she goes home. Daughter is speech therapist. Pt likes to Zumba   Functional History: Prior Function Level of Independence: Independent Comments: dtr states she is independent   Functional Status:  Mobility: Bed Mobility Overal bed mobility: Needs Assistance Bed Mobility: Supine to Sit, Sit to Supine Rolling: Min guard Sidelying to sit: Min guard, HOB elevated(at about 30 deg) Supine to sit: Min guard, HOB elevated Sit to supine: Supervision Sit to sidelying: Min guard General bed mobility comments: Pt was OOB in the recliner chair.  Transfers Overall transfer level: Needs assistance Equipment used: Rolling walker (2 wheeled), None Transfers: Sit to/from Stand, Stand Pivot Transfers Sit to Stand: Supervision Stand pivot transfers: Min assist General transfer comment: pt performs sit to stand with RW and in bathroom with use of grab bar with supervision. Pt  needing minA to stand pivot transfers due to imbalance Ambulation/Gait Ambulation/Gait assistance: Min assist, Mod assist Gait Distance (Feet): 80 Feet(40' with RW minA, 10' without device with minA-modA) Assistive device: Rolling walker (2 wheeled), None Gait Pattern/deviations: Step-to pattern, Shuffle, Decreased dorsiflexion - left General Gait Details: pt with L foot drag which increases with fatigue, intermittent festinating gait which also becomes more prevalent with fatigue. Pt with multiple anterior losses of balance Gait velocity: reduced Gait velocity interpretation: <1.8 ft/sec, indicate of risk for recurrent falls   ADL: ADL Overall ADL's : Needs assistance/impaired Grooming: Wash/dry hands, Wash/dry face, Oral care, Brushing hair, Min guard, Standing Grooming Details (indicate cue type and reason): Pt brushed teeth, and forgot to wipe her mouth leaving  toothpaste on her mouth.  Cues provided for sequencing  Upper Body Bathing: Supervision/ safety, Set up, Sitting Lower Body Bathing: Minimal assistance, Sit to/from stand Upper Body Dressing : Moderate assistance, Sitting Lower Body Dressing: Minimal assistance, Sit to/from stand Toilet Transfer: Min guard, Ambulation, Comfort height toilet, RW, Grab bars Toilet Transfer Details (indicate cue type and reason): unable to attempt this session due to nausea Toileting- Clothing Manipulation and Hygiene: Minimal assistance, Sit to/from stand Functional mobility during ADLs: Min guard, Rolling walker General ADL Comments: pt progresed to EOB and required return to supine due to nauseated and RN giving medication   Cognition: Cognition Overall Cognitive Status: Impaired/Different from baseline Arousal/Alertness: (drowsy) Orientation Level: Oriented X4 Attention: Sustained Sustained Attention: Impaired Sustained Attention Impairment: Functional basic Memory: Impaired Memory Impairment: Decreased recall of new information,  Retrieval deficit, Storage deficit Problem Solving: Impaired Problem Solving Impairment: Functional basic Comments: slow processnig Cognition Arousal/Alertness: Awake/alert Behavior During Therapy: WFL for tasks assessed/performed Overall Cognitive Status: Impaired/Different from baseline Area of Impairment: Memory, Following commands, Problem solving, Attention Orientation Level: Disoriented to, Time(disoriented to day, knows month and year) Current Attention Level: Selective Memory: Decreased short-term memory Following Commands: Follows one step commands consistently, Follows multi-step commands inconsistently Safety/Judgement: Decreased awareness of safety Awareness: Emergent Problem Solving: Slow processing General Comments: Pt with decreased STM, but generally, cognition improving, high anxiety when AD taken away, difficulty sequencing safely needing multiple cues throughout session for similar safety cues (stay near railing in the hallway, turn all the way around and back up/reach back before sitting  down).     Physical Exam: Blood pressure 139/81, pulse 95, temperature 98.6 F (37 C), temperature source Oral, resp. rate 11, height 5\' 6"  (1.676 m), weight 55.7 kg, last menstrual period 05/01/2009, SpO2 99 %. Physical Exam  Nursing note and vitals reviewed. Constitutional:  Sitting up in bed; daughter at bedside, appropriate, tape on L lens of eyeglasses, NAD  HENT:  Head: Normocephalic.  Nose: Nose normal.  Mouth/Throat: Oropharynx is clear and moist. No oropharyngeal exudate.  Couple of staples at prior drain site left scalp. Crani incision C/D/I- behind L ear- less swelling noted from a few days ago L facial droop- however face now only a little puffy- less swelling of cheek.    Eyes: Right eye exhibits no discharge. Left eye exhibits no discharge. No scleral icterus.  L eye not looking laterally very well- was able to look a little past midline today- which is  better Double vision still looking midline and looking Left, but not looking right when tested- no nystagmus seen L lower eyelid drooping  Neck: No tracheal deviation present.  Cardiovascular: Tachycardia present. Exam reveals no gallop and no friction rub.  No murmur heard. Heart rate 110's at rest Tachycardic- regular rhythm  Respiratory: She has no wheezes.  CTA B/L- no W/R/R  GI: She exhibits no distension.  Incision under umbilicus C/D/I  Soft, NT, ND, (+)BS   Musculoskeletal:     Cervical back: Normal range of motion and neck supple.     Comments: 5/5 in UEs deltoids, biceps, triceps, WE, grip and finger abd B/L- same RLE- same since 2 days ago- HF, KE, KF, DF and PF 5/5 LLE_ now HF 5-/5; otherwise 5/5 in KE/KF DF and PF  Neurological: She is alert.  Left facial paresis with mild dysmathia. Left ptosis with diplopia--able to compensate with taped glasses. EOMI appears smoother. Left eye has few beats nystagmus with upward and medial gaze.  LUE -fine motor coordination improving with mild ataxia.    Skin: Skin is warm and dry.  Well healed old anterior L-THR incision - well healed No skin breakdown on backside- no erythema IV L forearm- looks OK  Psychiatric: She has a normal mood and affect.      Lab Results Last 48 Hours  No results found for this or any previous visit (from the past 48 hour(s)).   Imaging Results (Last 48 hours)  No results found.           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  Antithrombotics: -DVT/anticoagulation:  Pharmaceutical: Heparin             -antiplatelet therapy: N/a 3. Headaches/Pain Management: Worse at nights--continue hydrocodone prn.  Continue tylenol qid--decrease to  650 mg.  4. Mood: LCSW to follow for evaluation and support.              -antipsychotic agents: N/A 5. Neuropsych: This patient is capable of making decisions on her own behalf. 6. Skin/Wound Care: Monitor for healing. Routine pressure relief measures.  7. Fluids/Electrolytes/Nutrition: Monitor I/O. Intake improved as N/V now resolved.  8. Left ptosis: Continue eye drops 5 X day and patch  at night/when asleep. Patient educated on compliance.  9. Resting tachycardia: Continue metoprolol and montinue for now. Multifactorial due to surgery, debility, anemia.  10. ABLA: Iron dextran 5/23--continue iron supplement.  Recheck labs in am. 11. HTN: Poorly controlled--will monitor for now.  12. H/o depression/anxiety: Continue Prozac and Buspar--off Remeron at this time.  13. Left hip pain s/p THR 12/20: Worse now--will order Xray to evaluate.  14. Small bowel RSXN: Continue Bentyl. Has been having BMs--Will d/c miralax (has been refusing) as colace bid effective.  15. Vestibular dysfunction: N/V have resolved. Will order Vestibular evaluation/ treat.          Bary Leriche, PA-C 02/03/2020      I have personally performed a face to face diagnostic evaluation of this patient and formulated the key components of the plan.  Additionally, I have personally reviewed laboratory data, imaging studies, as well as relevant notes and concur with the physician assistant's documentation above.   The patient's status has not changed from the original H&P.  Any changes in documentation from the acute care chart have been noted above.

## 2020-02-05 ENCOUNTER — Inpatient Hospital Stay (HOSPITAL_COMMUNITY): Payer: BC Managed Care – PPO | Admitting: Physical Therapy

## 2020-02-05 ENCOUNTER — Inpatient Hospital Stay (HOSPITAL_COMMUNITY): Payer: BC Managed Care – PPO

## 2020-02-05 ENCOUNTER — Inpatient Hospital Stay (HOSPITAL_COMMUNITY): Payer: BC Managed Care – PPO | Admitting: Speech Pathology

## 2020-02-05 DIAGNOSIS — E46 Unspecified protein-calorie malnutrition: Secondary | ICD-10-CM

## 2020-02-05 DIAGNOSIS — E8809 Other disorders of plasma-protein metabolism, not elsewhere classified: Secondary | ICD-10-CM

## 2020-02-05 DIAGNOSIS — D62 Acute posthemorrhagic anemia: Secondary | ICD-10-CM

## 2020-02-05 DIAGNOSIS — G8918 Other acute postprocedural pain: Secondary | ICD-10-CM

## 2020-02-05 DIAGNOSIS — R000 Tachycardia, unspecified: Secondary | ICD-10-CM

## 2020-02-05 NOTE — Progress Notes (Signed)
Physical Therapy Session Note  Patient Details  Name: Cristina Conner MRN: [id number] Date of Birth: 1961/05/01  Today's Date: 02/05/2020 PT Individual Time: 5465-0354 PT Individual Time Calculation (min): 70 min   Short Term Goals: Week 1:  PT Short Term Goal 1 (Week 1): STGs=LTGs  Skilled Therapeutic Interventions/Progress Updates:    Pt received in recliner asleep, but upon awakening agreeable to therapy session. Reports needing to use bathroom. Sit>stand recliner>RW with CGA for steadying. Gait ~35ft x2 in/out bathroom using RW with CGA for steadying - min cuing for proper AD management. Sit<>stand to/from toilet with close supervision. Continent of bladder, seated peri-care without assist. Standing hand hygiene with close supervision/CGA.  Transported to/from gym in w/c for time management and energy conservation. Gait training 158ft, no AD, with min assist for steadying/balance - demonstrates decreased L LE stance time and with fatigue decreased L LE foot clearance and step length as well as minor anterior LOB. L LE strengthening and dynamic standing balance task via repeated sit<>stands with B HHA and R LE on 4" step to promote increased L lateral weight shift x10reps with min assist for lifting and balance to prevent posterior lean/LOB. Dynamic standing balance and LE strengthening via repeated forward stepping on/off 4" step with min assist for balance x10 reps each LE. L LE strengthening and dynamic gait task via lateral side stepping ~83ft each direction with level 2 theraband around knees, no UE support, and CGA for steadying. Dynamic standing balance and L UE NMR task via cross body reaching to grasp and place clothespins from high/low surfaces with CGA for steadying. Dynamic standing balance challenging the vestibular system via standing on airex while matching cards with L UE - min assist for balance due to posterior lean. Pt reporting significant fatigue. Stand pivot to w/c, no AD, with  CGA for steadying. Stand pivot w/c<>Nustep, no AD, with CGA for steadying. Performed cardiopulmonary endurance/activity tolerance via B UE and B LE reciprocal movement patterns on Nustep against level 3 resistance for 4 minutes totaling 102steps. Transported back to room and pt requesting to lie down and rest. Stand pivot to EOB, no AD, with CGA. Sit>supine with supervision. Pt left supine in bed with needs in reach and bed alarm on.  Therapy Documentation Precautions:  Precautions Precautions: Fall Precaution Comments: left leg weakness, imbalance, double vision (has corrective glasses) Restrictions Weight Bearing Restrictions: No  Pain: Reports slight L anterior hip pain with Nustep - declines intervention or adjustment of chair otherwise no c/o pain during session.   Therapy/Group: Individual Therapy  Tawana Scale, PT, DPT 02/05/2020, 1:01 PM

## 2020-02-05 NOTE — Plan of Care (Signed)
  Problem: RH BOWEL ELIMINATION Goal: RH STG MANAGE BOWEL WITH ASSISTANCE Description: STG Manage Bowel with mod I Assistance. Outcome: Progressing   Problem: RH BLADDER ELIMINATION Goal: RH STG MANAGE BLADDER WITH EQUIPMENT WITH ASSISTANCE Description: STG Manage Bladder With Equipment With mod I Assistance Outcome: Progressing   Problem: RH SKIN INTEGRITY Goal: RH STG SKIN FREE OF INFECTION/BREAKDOWN Description: Patient will not have signs & symptoms of infection to incisions while on CIR Outcome: Progressing

## 2020-02-05 NOTE — Progress Notes (Signed)
[place] PHYSICAL MEDICINE & REHABILITATION PROGRESS NOTE   Subjective/Complaints: Patient seen sitting up in bed this AM.  She states she slept well overnight.  She states had a "rough" first day of therapies yesterday.  ROS: Denies CP, SOB, N/V/D  Objective:   DG HIP UNILAT WITH PELVIS 2-3 VIEWS LEFT  Result Date: 02/03/2020 CLINICAL DATA:  Left hip pain for 5 months EXAM: DG HIP (WITH OR WITHOUT PELVIS) 2-3V LEFT COMPARISON:  08/17/2019 FINDINGS: Frontal view of the pelvis as well as frontal and frogleg lateral views of the left hip are obtained. The left hip arthroplasty is in stable position with no evidence of complication. There are no acute displaced fractures. Right hip is unremarkable. Soft tissues are normal. IMPRESSION: 1. Unremarkable left hip arthroplasty. 2. No acute bony abnormality. Electronically Signed   By: Randa Ngo M.D.   On: 02/03/2020 19:17   Recent Labs    02/04/20 0630  WBC 10.1  HGB 8.2*  HCT 26.6*  PLT 516*   Recent Labs    02/04/20 0630  NA 138  K 4.1  CL 105  CO2 24  GLUCOSE 107*  BUN 11  CREATININE 0.81  CALCIUM 9.0    Intake/Output Summary (Last 24 hours) at 02/05/2020 1622 Last data filed at 02/05/2020 1300 Gross per 24 hour  Intake 836 ml  Output --  Net 836 ml     Physical Exam: Vital Signs Blood pressure 122/77, pulse 88, temperature 98.3 F (36.8 C), resp. rate 14, height 5\' 6"  (1.676 m), weight 55.9 kg, last menstrual period 05/01/2009, SpO2 100 %. Constitutional: No distress . Vital signs reviewed. HENT: Normocephalic.  Atraumatic. Eyes: EOMI. No discharge. Cardiovascular: No JVD. Respiratory: Normal effort.  No stridor. GI: Non-distended. Skin: Incision C/D/I Psych: Normal mood.  Normal behavior. Musc: No edema in extremities.  No tenderness in extremities. Neuro: Alert Left facial weakness Motor: Grossly 4+/5 throughout  Assessment/Plan: 1. 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  Physiatrist is providing close team supervision and 24 hour management of active medical problems listed below.  Physiatrist and rehab team continue to assess barriers to discharge/monitor patient progress toward functional and medical goals  Care Tool:  Bathing    Body parts bathed by patient: Right arm, Left arm, Chest, Abdomen, Front perineal area, Buttocks, Right upper leg, Left upper leg, Right lower leg, Left lower leg, Face         Bathing assist Assist Level: Minimal Assistance - Patient > 75%     Upper Body Dressing/Undressing Upper body dressing   What is the patient wearing?: Pull over shirt    Upper body assist Assist Level: Minimal Assistance - Patient > 75%    Lower Body Dressing/Undressing Lower body dressing      What is the patient wearing?: Pants     Lower body assist Assist for lower body dressing: Minimal Assistance - Patient > 75%     Toileting Toileting    Toileting assist Assist for toileting: Contact Guard/Touching assist     Transfers Chair/bed transfer  Transfers assist     Chair/bed transfer assist level: Contact Guard/Touching assist     Locomotion Ambulation   Ambulation assist      Assist level: Minimal Assistance - Patient > 75% Assistive device: No Device Max distance: 15ft   Walk 10 feet activity   Assist     Assist level: Minimal Assistance -  Patient > 75% Assistive device: No Device   Walk 50 feet activity   Assist Walk 50 feet with 2 turns activity did not occur: Safety/medical concerns  Assist level: Minimal Assistance - Patient > 75% Assistive device: No Device    Walk 150 feet activity   Assist Walk 150 feet activity did not occur: Safety/medical concerns         Walk 10 feet on uneven surface  activity   Assist Walk 10 feet on uneven surfaces activity did not occur: Safety/medical concerns          Wheelchair     Assist Will patient use wheelchair at discharge?: No             Wheelchair 50 feet with 2 turns activity    Assist            Wheelchair 150 feet activity     Assist          Blood pressure 122/77, pulse 88, temperature 98.3 F (36.8 C), resp. rate 14, height 5\' 6"  (1.676 m), weight 55.9 kg, last menstrual period 05/01/2009, SpO2 100 %.  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  Antithrombotics: -DVT/anticoagulation:  Pharmaceutical: Heparin             -antiplatelet therapy: N/a 3. Headaches/Pain Management: Worse at nights--continue hydrocodone prn.  Continue tylenol qid--decreased to 650 mg.   Controlled on 6/5 4. Mood: LCSW to follow for evaluation and support.  -ego support by team. Seems to be in good spirits at present  -on prozac and buspar for anxiety/depression. (off remeron currently)              -antipsychotic agents: N/A 5. Neuropsych: This patient is capable of making decisions on her own behalf. 6. Skin/Wound Care: Monitor for healing. Routine pressure relief measures.   -continue sutures/staples on scalp for now 7. Fluids/Electrolytes/Nutrition: Monitor I/O.  8. Left VII nerve injury: Continue eye drops 5 X day and patch at night/when asleep.    Keep eye covered,lubricated  9. Resting tachycardia: Continue metoprolol and montinue for now. Multifactorial due to surgery, debility, anemia.   Improving on 6/5 10. ABLA: Iron dextran 5/23--continue iron supplement.  Hemoglobin 8.2 on 6/4  Continue to monitor 11. HTN: borderline control  Controlled on 6/5 13. 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 Small bowel RSXN: Continue Bentyl. Has been having BMs- stopped miralax (has been refusing) as colace bid effective.  15.  Vestibular dysfunction: N/V have resolved.   Vestibular evaluation ordered but treatment will not likely help given central origin of sx 16.  Hypoalbuminemia  Continue supplementation  LOS: 2 days A FACE TO FACE EVALUATION WAS PERFORMED  Hajer Dwyer Lorie Phenix 02/05/2020, 4:22 PM

## 2020-02-05 NOTE — Progress Notes (Signed)
Speech Language Pathology Daily Session Note  Patient Details  Name: Cristina Conner MRN: [id number] Date of Birth: [birth year]  Today's Date: 02/05/2020 SLP Individual Time: 4098-1191 SLP Individual Time Calculation (min): 44 min  Short Term Goals: Week 1: SLP Short Term Goal 1 (Week 1): Patient will consume trials of regular textures with efficient mastication and complete oral clearance X 2 sessions without overt s/s of aspiration and supervision level verbal cues for use of compensatory strateiges prior to upgrade. SLP Short Term Goal 2 (Week 1): Patient will utilize speech intelligibility strategies to achieve 100% intelligibility at the conversation level with Mod I. SLP Short Term Goal 3 (Week 1): Patient will demonstrate complex problem solving for functional tasks with supervision verbal cues. SLP Short Term Goal 4 (Week 1): Patient will recall new, functional information with supervision verbal and visual cues.  Skilled Therapeutic Interventions: Pt was seen for skilled ST targeting dysphagia and cognition. Pt consumed upgraded trial of regular texture solids and was Mod I for use of compensatory swallow strategies. Mastication was slightly prolonged and pt reported reduced left lingual sensation and reduced mandibular ROM. However, mastication was functional and oral clearance achieved with use of liquid washes. One immediate cough noted following sip of thin when pt attempted to speak prior to swallowing. Recommend continue current diet and ST will continue to provide opportunities to work toward advancement.  SLP further facilitated session with a complex finance management activity (checkbook balancing), during which pt required overall Min A for error awareness and organization, however only Supervision A verbal cues for complex problem solving. Pt with good insight into performance on this task and for the need to "slow down" to increase accuracy. Pt left laying in bed with alarm set and  needs within reach. Continue per current plan of care.          Pain Pain Assessment Pain Scale: 0-10 Pain Score: 0-No pain  Therapy/Group: Individual Therapy  Arbutus Leas 02/05/2020, 8:30 AM

## 2020-02-05 NOTE — Progress Notes (Signed)
Occupational Therapy Session Note  Patient Details  Name: Cristina Conner MRN: [id number] Date of Birth: 1961-02-09  Today's Date: 02/05/2020 OT Individual Time: 1045-1200 OT Individual Time Calculation (min): 75 min    Short Term Goals: Week 1:  OT Short Term Goal 1 (Week 1): STG=LTG d.t ELOS  Skilled Therapeutic Interventions/Progress Updates:     1;1. Pt received in bed agreeable to bathing and dressing this date at shower level. Pt gathers all clothing with RW at ambulatory level with VC for keeping feet inside RW during turns and RW management when reaching to gather items out of closet. Pt transitions to bathroom for toileting and bathign at [place] sit to stand level. Pt able to sequence without VC. Pt dresses from chair in bathroom with S and VC for hemi technique. Pt reports already grooming this morning and OT begins vision assessment. Pt with decrease smoothness of pursuits of L eye tracking vertically and diagonally. Pt with no double vision today except on occasions looking out to L visual field. Pt with small peripheral field deficits, but able ot compensate with head turns functionally. Pt completes hallway ambulatory search for numbers on walls with RW and VC for Rw management and keeping B feet inside RW. Pt completes dynavision with 1.8 avg reaction time to locate stimuli using B hands with no restricitons. When tasked with locating stimuli with RUE for red and LUE for green pt needs 2.3 seconds R and 3.0 seconds L respectively with dual task in standing. Exited session with pt seated in bed, exit alarm on and call light tin reach  Therapy Documentation Precautions:  Precautions Precautions: Fall Precaution Comments: left leg weakness, imbalance, double vision (has corrective glasses) Restrictions Weight Bearing Restrictions: No General:   Vital Signs:   Pain: Pain Assessment Pain Scale: 0-10 Pain Score: 0-No pain ADL: ADL Eating: Supervision/safety Grooming: Minimal  assistance Where Assessed-Grooming: Standing at sink Upper Body Bathing: Supervision/safety Where Assessed-Upper Body Bathing: Shower Lower Body Bathing: Minimal assistance Where Assessed-Lower Body Bathing: Shower Upper Body Dressing: Supervision/safety Where Assessed-Upper Body Dressing: Sitting at sink Lower Body Dressing: Minimal assistance Where Assessed-Lower Body Dressing: Sitting at sink, Standing at sink Toileting: Minimal assistance Where Assessed-Toileting: Glass blower/designer: Psychiatric nurse Method: Counselling psychologist: Energy manager: Environmental education officer Method: Heritage manager: Grab bars, Nurse, learning disability    Praxis   Exercises:   Other Treatments:     Therapy/Group: Individual Therapy  Tonny Branch 02/05/2020, 10:57 AM

## 2020-02-05 NOTE — Progress Notes (Signed)
Pt refused heparin 02/04/20 evening dose and 02/05/20 morning dose. Pt was given education on refusal.

## 2020-02-06 ENCOUNTER — Inpatient Hospital Stay (HOSPITAL_COMMUNITY): Payer: BC Managed Care – PPO

## 2020-02-06 NOTE — Plan of Care (Signed)
  Problem: Consults Goal: RH BRAIN INJURY PATIENT EDUCATION Description: Description: See Patient Education module for eduction specifics Outcome: Progressing   Problem: RH BOWEL ELIMINATION Goal: RH STG MANAGE BOWEL WITH ASSISTANCE Description: STG Manage Bowel with mod I Assistance. Outcome: Progressing   Problem: RH BLADDER ELIMINATION Goal: RH STG MANAGE BLADDER WITH EQUIPMENT WITH ASSISTANCE Description: STG Manage Bladder With Equipment With mod I Assistance Outcome: Progressing

## 2020-02-06 NOTE — Progress Notes (Signed)
[place] PHYSICAL MEDICINE & REHABILITATION PROGRESS NOTE   Subjective/Complaints: Patient seen sitting up in bed this morning.  She states she slept well overnight.  She asks for IV to be DC'd.  ROS: Denies CP, SOB, N/V/D  Objective:   No results found. Recent Labs    02/04/20 0630  WBC 10.1  HGB 8.2*  HCT 26.6*  PLT 516*   Recent Labs    02/04/20 0630  NA 138  K 4.1  CL 105  CO2 24  GLUCOSE 107*  BUN 11  CREATININE 0.81  CALCIUM 9.0    Intake/Output Summary (Last 24 hours) at 02/06/2020 1147 Last data filed at 02/06/2020 0730 Gross per 24 hour  Intake 670 ml  Output --  Net 670 ml     Physical Exam: Vital Signs Blood pressure 127/81, pulse 100, temperature 98.2 F (36.8 C), resp. rate 14, height 5\' 6"  (1.676 m), weight 55.9 kg, last menstrual period 05/01/2009, SpO2 100 %. Constitutional: No distress . Vital signs reviewed. HENT: Normocephalic.  Atraumatic. Eyes: EOMI. No discharge. Cardiovascular: No JVD. Respiratory: Normal effort.  No stridor. GI: Non-distended. Skin: Incision C/D/I Psych: Normal mood.  Normal behavior. Musc: No edema in extremities.  No tenderness in extremities. Neuro: Alert Left facial weakness, unchanged Motor: Grossly 4+/5 throughout, unchanged  Assessment/Plan: 1. 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  Physiatrist is providing close team supervision and 24 hour management of active medical problems listed below.  Physiatrist and rehab team continue to assess barriers to discharge/monitor patient progress toward functional and medical goals  Care Tool:  Bathing    Body parts bathed by patient: Right arm, Left arm, Chest, Abdomen, Front perineal area, Buttocks, Right upper leg, Left upper leg, Right lower leg, Left lower leg, Face         Bathing assist Assist Level: Contact Guard/Touching assist      Upper Body Dressing/Undressing Upper body dressing   What is the patient wearing?: Pull over shirt, Bra    Upper body assist Assist Level: Supervision/Verbal cueing    Lower Body Dressing/Undressing Lower body dressing      What is the patient wearing?: Pants     Lower body assist Assist for lower body dressing: Contact Guard/Touching assist     Toileting Toileting    Toileting assist Assist for toileting: Contact Guard/Touching assist     Transfers Chair/bed transfer  Transfers assist     Chair/bed transfer assist level: Contact Guard/Touching assist     Locomotion Ambulation   Ambulation assist      Assist level: Minimal Assistance - Patient > 75% Assistive device: No Device Max distance: 164ft   Walk 10 feet activity   Assist     Assist level: Minimal Assistance - Patient > 75% Assistive device: No Device   Walk 50 feet activity   Assist Walk 50 feet with 2 turns activity did not occur: Safety/medical concerns  Assist level: Minimal Assistance - Patient > 75% Assistive device: No Device    Walk 150 feet activity   Assist Walk 150 feet activity did not occur: Safety/medical concerns         Walk 10 feet on uneven surface  activity   Assist Walk 10 feet on uneven surfaces activity did not occur: Safety/medical concerns         Wheelchair     Assist Will patient use wheelchair at discharge?: No  Wheelchair 50 feet with 2 turns activity    Assist            Wheelchair 150 feet activity     Assist          Blood pressure 127/81, pulse 100, temperature 98.2 F (36.8 C), resp. rate 14, height 5\' 6"  (1.676 m), weight 55.9 kg, last menstrual period 05/01/2009, SpO2 100 %.  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   Antithrombotics: -DVT/anticoagulation:  Pharmaceutical: Heparin             -antiplatelet therapy: N/a 3. Headaches/Pain Management: Worse at nights--continue hydrocodone prn.  Continue tylenol qid--decreased to 650 mg.   Controlled on 6/6 4. Mood: LCSW to follow for evaluation and support.  -ego support by team. Seems to be in good spirits at present  -on prozac and buspar for anxiety/depression. (off remeron currently)              -antipsychotic agents: N/A 5. Neuropsych: This patient is capable of making decisions on her own behalf. 6. Skin/Wound Care: Monitor for healing. Routine pressure relief measures.   -continue sutures/staples on scalp for now 7. Fluids/Electrolytes/Nutrition: Monitor I/O.  8. Left VII nerve injury: Continue eye drops 5 X day and patch at night/when asleep.    Keep eye covered,lubricated  9. Resting tachycardia: Continue metoprolol and montinue for now. Multifactorial due to surgery, debility, anemia.   Overall improving on 6/6 10. ABLA: Iron dextran 5/23--continue iron supplement.  Hemoglobin 8.2 on 6/4  Continue to monitor 11. HTN:   Controlled on 6/6 13. 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 Has been having BMs- stopped miralax (has been refusing) as colace bid effective.  15. Vestibular dysfunction: N/V have resolved.   Vestibular evaluation ordered but treatment will not likely help given central origin of sx 16.  Hypoalbuminemia  Continue supplementation  LOS: 3 days A FACE TO FACE EVALUATION WAS PERFORMED  Faiga Stones Lorie Phenix 02/06/2020, 11:47 AM

## 2020-02-06 NOTE — Progress Notes (Signed)
Occupational Therapy Session Note  Patient Details  Name: Cristina Conner MRN: [id number] Date of Birth: 10-23-1960  Today's Date: 02/06/2020 OT Individual Time: 6859-9234 OT Individual Time Calculation (min): 56 min   Session 2: OT Individual Time: 1200-1230 OT Individual Time Calculation (min): 30 min    Short Term Goals: Week 1:  OT Short Term Goal 1 (Week 1): STG=LTG d.t ELOS  Skilled Therapeutic Interventions/Progress Updates:    Pt received sitting in chair with no c/o pain. Agreeable to shower. Pt completed functional mobility into shower with CGA using RW. Pt cued for safety when donning/doffing LB clothing seated instead of standing. Pt completed all bathing sit <> stand with CGA. Pt's head remained dry throughout shower. Pt donned LB clothing with CGA and shirt with set up assist. Pt stood at the sink with CGA and completed oral care and grooming tasks. Pt completed 150 ft of functional mobility to the therapy gym with her RW with CGA. Pt stood and completed reciprocal stepping activity onto 3 in step with 1 LOB requiring min assist posteriorly to correct. Pt overall required min guard, especially with L leg single stance. Pt then completed isometric single leg stance to challenge standing balance with min A required. Pt demonstrated improved initiation with verbal cueing. Discussed diplopia and eye exercises to do independently. Pt returned to her room and was left sitting up with chair alarm belt fastened and all needs met.   Session 2: Pt received sitting in chair agreeable to session. No c/o pain. Pt reported need to use bathroom. She used RW to complete functional mobility into the bathroom to complete toileting with CGA overall. Pt completed 100 ft of functional mobility to the dayroom. Pt completed blocked practice sit <> stand with overhead and forward reach using a 5 lb dowel to increase functional activity tolerance and coordination. Next pt completed narrow stance lunge to  increase dynamic standing balance and BLE strength needed for ADL transfers. Min guard and cueing for technique provided throughout all exercises. Pt reported enjoying exercise centered session, as she was very active PTA.  Pt returned to her room and was left sitting up in the recliner with all needs met, chair alarm set.   Therapy Documentation Precautions:  Precautions Precautions: Fall Precaution Comments: left leg weakness, imbalance, double vision (has corrective glasses) Restrictions Weight Bearing Restrictions: No   Therapy/Group: Individual Therapy  Curtis Sites 02/06/2020, 7:00 AM

## 2020-02-06 NOTE — Progress Notes (Signed)
Patient refused heparin injection on 6/5 and 6/6, education provided. Patient understands Importance and use for heparin. Continues to refuse. Noted left for attending MD. Adria Devon, LPN

## 2020-02-07 ENCOUNTER — Inpatient Hospital Stay (HOSPITAL_COMMUNITY): Payer: BC Managed Care – PPO | Admitting: Speech Pathology

## 2020-02-07 ENCOUNTER — Inpatient Hospital Stay (HOSPITAL_COMMUNITY): Payer: BC Managed Care – PPO

## 2020-02-07 LAB — BASIC METABOLIC PANEL
Anion gap: 9 (ref 5–15)
BUN: 11 mg/dL (ref 6–20)
CO2: 24 mmol/L (ref 22–32)
Calcium: 9.4 mg/dL (ref 8.9–10.3)
Chloride: 105 mmol/L (ref 98–111)
Creatinine, Ser: 0.75 mg/dL (ref 0.44–1.00)
GFR calc Af Amer: >60 mL/min (ref >60)
GFR calc non Af Amer: >60 mL/min (ref >60)
Glucose, Bld: 108 mg/dL — ABNORMAL HIGH (ref 70–99)
Potassium: 3.8 mmol/L (ref 3.5–5.1)
Sodium: 138 mmol/L (ref 135–145)

## 2020-02-07 LAB — CBC
HCT: 30.9 % — ABNORMAL LOW (ref 36.0–46.0)
Hemoglobin: 9.2 g/dL — ABNORMAL LOW (ref 12.0–15.0)
MCH: 25.8 pg — ABNORMAL LOW (ref 26.0–34.0)
MCHC: 29.8 g/dL — ABNORMAL LOW (ref 30.0–36.0)
MCV: 86.8 fL (ref 80.0–100.0)
Platelets: 594 10*3/uL — ABNORMAL HIGH (ref 150–400)
RBC: 3.56 MIL/uL — ABNORMAL LOW (ref 3.87–5.11)
RDW: 20.9 % — ABNORMAL HIGH (ref 11.5–15.5)
WBC: 7.9 10*3/uL (ref 4.0–10.5)
nRBC: 0 % (ref 0.0–0.2)

## 2020-02-07 NOTE — Plan of Care (Signed)
  Problem: Consults Goal: RH BRAIN INJURY PATIENT EDUCATION Description: Description: See Patient Education module for eduction specifics Outcome: Progressing Goal: Skin Care Protocol Initiated - if Braden Score 18 or less Description: If consults are not indicated, leave blank or document N/A Outcome: Progressing   Problem: RH BOWEL ELIMINATION Goal: RH STG MANAGE BOWEL WITH ASSISTANCE Description: STG Manage Bowel with mod I Assistance. Outcome: Progressing   Problem: RH BLADDER ELIMINATION Goal: RH STG MANAGE BLADDER WITH EQUIPMENT WITH ASSISTANCE Description: STG Manage Bladder With Equipment With mod I Assistance Outcome: Progressing   Problem: RH SKIN INTEGRITY Goal: RH STG SKIN FREE OF INFECTION/BREAKDOWN Description: Patient will not have signs & symptoms of infection to incisions while on CIR Outcome: Progressing Goal: RH STG MAINTAIN SKIN INTEGRITY WITH ASSISTANCE Description: STG Maintain Skin Integrity With mod I Assistance. Outcome: Progressing   Problem: RH SAFETY Goal: RH STG ADHERE TO SAFETY PRECAUTIONS W/ASSISTANCE/DEVICE Description: STG Adhere to Safety Precautions With Assistance/Device. Outcome: Progressing   Problem: RH COGNITION-NURSING Goal: RH STG ANTICIPATES NEEDS/CALLS FOR ASSIST W/ASSIST/CUES Description: STG Anticipates Needs/Calls for Assist With Assistance/Cues. Outcome: Progressing   Problem: RH PAIN MANAGEMENT Goal: RH STG PAIN MANAGED AT OR BELOW PT'S PAIN GOAL Description: Pain scale <4/10 Outcome: Progressing   Problem: RH KNOWLEDGE DEFICIT BRAIN INJURY Goal: RH STG INCREASE KNOWLEDGE OF DYSPHAGIA/FLUID INTAKE Outcome: Progressing

## 2020-02-07 NOTE — Progress Notes (Signed)
Physical Therapy Session Note  Patient Details  Name: Cristina Conner MRN: [id number] Date of Birth: 1961-06-17  Today's Date: 02/07/2020 PT Individual Time: 5093-2671 PT Individual Time Calculation (min): 72 min   Short Term Goals: Week 1:  PT Short Term Goal 1 (Week 1): STGs=LTGs  Skilled Therapeutic Interventions/Progress Updates:     Patient in recliner in the room upon PT arrival. Patient alert and agreeable to PT session. Patient reported 5/10 L ear/head pain during session, RN made aware. PT provided repositioning, rest breaks, and distraction as pain interventions throughout session.   Therapeutic Activity: Bed Mobility: Patient performed supine sit to supine with mod I with HOB elevated.  Transfers: Patient performed sit to/from stand x7 with close supervision with and without RW. Provided verbal cues for hand placement on RW, and reaching back to sit for safety.  Five times Sit to Stand Test (FTSS) Method: Use a straight back chair with a solid seat that is 16-18" high. Ask participant to sit on the chair with arms folded across their chest.   Instructions: "Stand up and sit down as quickly as possible 5 times, keeping your arms folded across your chest."   Measurement: Stop timing when the participant stands the 5th time.  TIME: ___27___ (in seconds)  Times > 13.6 seconds is associated with increased disability and morbidity (Guralnik, 2000) Times > 15 seconds is predictive of recurrent falls in healthy individuals aged 18 and older (Buatois, et al., 2008) Normal performance values in community dwelling individuals aged 8 and older (Bohannon, 2006): o 60-69 years: 11.4 seconds o 70-79 years: 12.6 seconds o 80-89 years: 14.8 seconds  MCID: ? 2.3 seconds for Vestibular Disorders (Meretta, 2006)  Gait Training:  Patient ambulated 250 feet x2 using RW with CGA for safety/balance. Ambulated with decreased gait speed, decreased step length and height, variable foot  placement, wide BOS, and decreased control/awareness of RW. Provided verbal cues for increased step height for safety, consistent foot placement using floor tiles as visual cue for ~50 ft, cued to maintain consistent foot placement without looking after, and provided cues for avoiding objects due to decreased awareness with use of RW. Patient ambulated the last 100 ft of the first trial without an AD, patient required intermittent min A for steadying due to forward LOB x2 and intermittently would reach out to the rail on the wall for steadying support throughout.   6 Min Walk Test:  Instructed patient to ambulate as quickling and as safely as possible for 6 minutes using LRAD. Patient was allowed to take standing rest breaks without stopping the test, but if he required a sitting rest break the clock would be stopped and the test would be over.  Results: 440 feet using a RW, HR 94-101 throughout, SPO2 100% and RPE 5/10 at end of test.  Noted increased gait deviations, described above, with fatigue.  Neuromuscular Re-ed: Patient performed the following standing balance activities: -standing eyes open no UE support x2 min, noted increased sway and ankle strategies with fatigue -standing eyes closed no UE support, no change from eyes open x20 sec -standing on foam no UE support, required intermittent min A to correct forward or L LOB, noted decreased weight shift to L in standing, 2x2 min for improved use of ankle strategies -standing on foam on L and 6" step on R for increased L weight shift on foam focused on L ankle strategies to maintain balance 2x1 min -standing on foam, patient performed Dynavision program A 2x1 min  with 5 sec delay with 4 quadrants and all rings with average 1.9 sec reaction time on R and 1.5 sec reaction time on L -sit to from stand from mat table x5 without UE support focused on forward weight shift and use of ankle strategies to maintain balance  Patient in bed with RN in the  room to provide pain medicine at end of session with breaks locked, bed alarm set, and all needs within reach.    Therapy Documentation Precautions:  Precautions Precautions: Fall Precaution Comments: left leg weakness, imbalance, double vision (has corrective glasses) Restrictions Weight Bearing Restrictions: No    Therapy/Group: Individual Therapy  Cleola Perryman L Andree Golphin PT, DPT  02/07/2020, 4:40 PM

## 2020-02-07 NOTE — Progress Notes (Signed)
Occupational Therapy Session Note  Patient Details  Name: Cristina Conner MRN: [id number] Date of Birth: 1961/03/06  Today's Date: 02/07/2020 OT Individual Time: 3825-0539 OT Individual Time Calculation (min): 68 min    Short Term Goals: Week 1:  OT Short Term Goal 1 (Week 1): STG=LTG d.t ELOS  Skilled Therapeutic Interventions/Progress Updates:    Pt received sitting up in recliner with no c/o pain. Pt paying bills via phone upon entry- discussed strategies to ensure paying on time and keeping track of when bills are due.  Pt agreeable to take shower. Pt required min cueing for safety awareness when doffing clothing. Pt able to bathing sit <> stand from shower chair with CGA. Discussed completion of IADLs around the home and safety precautions as well as energy conservation strategies to consider. Pt very receptive to education and cueing. Pt dressed, requiring CGA for standing. Pt completed oral care at the sink with close (S). Pt demonstrated improvement and self-monitoring with RW management. CGA- (S) provided during functional mobility to the therapy gym, 100 ft. Pt frequently drifts to the R during mobility and is able to self correct. Pt completed various dynamic standing balance activities in the gym, simulating home IADL tasks such as carrying a laundry basket and loading/unloading. Pt also completed overhead reaching without UE support, requiring min guard- min A overall. Pt completed laundry task and loaded dirty clothes into the washing machine on unit. Pt returned to her room and completed toileting with close (S). Pt left sitting up with all needs met, chair alarm set.   Therapy Documentation Precautions:  Precautions Precautions: Fall Precaution Comments: left leg weakness, imbalance, double vision (has corrective glasses) Restrictions Weight Bearing Restrictions: No  Therapy/Group: Individual Therapy  Curtis Sites 02/07/2020, 6:42 AM

## 2020-02-07 NOTE — Progress Notes (Signed)
Inpatient Rehabilitation Care Coordinator Assessment and Plan  Patient Details  Name: Cristina Conner MRN: [id number] Date of Birth: Jun 09, 1961  Today's Date: 02/07/2020  Problem List:  Patient Active Problem List   Diagnosis Date Noted  . Hypoalbuminemia due to protein-calorie malnutrition ([place])   . Acute blood loss anemia   . Sinus tachycardia   . Postoperative pain   . 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 Protein-calorie malnutrition, severe 01/30/2020  . Brain tumor ([place]) 01/27/2020  . Low folate 01/23/2020  . Anemia of chronic disease 01/23/2020  . Small bowel obstruction from retained endoscopy capsule at stricture s/p ileal resection 01/19/2020 01/18/2020  . 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 Primary localized osteoarthritis of left hip 08-31-19  . Death of family member-  husband passed Jul 2020 04/29/2019  . 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 Sleep difficulties 09/09/2018  . Pre-diabetes 03/09/2018  . Postmenopausal state- went thru early 40's 01/02/2018  . Primary osteoarthritis of left hip 01/02/2018  . Caregiver burden- for husband with ALLeukemia 01/02/2018  . Left hip pain 01/02/2018  . Osteopenia 07/31/2016  . Hypertension 05/20/2011  . Menopausal state 05/20/2011   Past Medical History:  Past Medical History:  Diagnosis Date  . Anxiety   . Arthritis   . Diverticulosis   . GERD (gastroesophageal reflux disease)   . Hypertension   . Osteopenia   . Post-operative nausea and vomiting    vomiting after general anesthesia per pt.  . Pre-diabetes    Past Surgical History:  Past Surgical History:  Procedure Laterality Date  . CESAREAN SECTION     x 2  . COLONOSCOPY    . CRANIOTOMY Left 01/27/2020   Procedure: Left Craniotomy for Tumor Resection;  Surgeon: Judith Part, MD;  Location: [facility];  Service: Neurosurgery;  Laterality: Left;  . ENDOMETRIAL ABLATION    .  ESOPHAGOGASTRODUODENOSCOPY  10/2019  . LAPAROSCOPY N/A 01/19/2020   Procedure: LAPAROSCOPY DIAGNOSTIC LAPAROTOMY WITH SMALL BOWEL RESECTION;  Surgeon: Ileana Roup, MD;  Location: [facility];  Service: General;  Laterality: N/A;  . PELVIC LAPAROSCOPY  1999   left salpingectomy, excision of Right, paratubal cyst  . PELVIC LAPAROSCOPY     laparoscopy with lysis of pelvic adhesions  . SHOULDER SURGERY  11/2010   "FROZEN SHOULDER"  . TOTAL HIP ARTHROPLASTY Left 31-Aug-2019   Procedure: LEFT TOTAL HIP ARTHROPLASTY ANTERIOR APPROACH;  Surgeon: Melrose Nakayama, MD;  Location: [facility];  Service: Orthopedics;  Laterality: Left;  . TUBAL LIGATION    . UPPER GASTROINTESTINAL ENDOSCOPY    . VENTRICULOSTOMY Left 01/27/2020   Procedure: VENTRICULOSTOMY;  Surgeon: Judith Part, MD;  Location: [facility];  Service: Neurosurgery;  Laterality: Left;   Social History:  reports that she has never smoked. She has never used smokeless tobacco. She reports previous alcohol use. She reports that she does not use drugs.  Family / Support Systems Marital Status: Widow/Widower How Long?: 1.5 years Patient Roles: Parent Spouse/Significant Other: Widowed; husband passed due to complications with leukemia Children: 2 adult children Other Supports: N/A Anticipated Caregiver: dtr Almyra Free [phone]) Ability/Limitations of Caregiver: None reported Caregiver Availability: 24/7 Family Dynamics: Pt was living alone, and caring for their 23yo. mother. Her brother is now providing care their mother.  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 Pt states she was aworkign part  time at [employer] Education: college Read: Yes Write: Yes Employment Status: Retired Date Retired/Disabled/Unemployed: 12/2016 Legal History/Current Legal Issues: Denies Guardian/Conservator: N/A    Abuse/Neglect Abuse/Neglect Assessment Can Be Completed: Yes Physical Abuse: Denies Verbal Abuse: Denies Sexual Abuse: Denies Exploitation of patient/patient's resources: Denies Self-Neglect: Denies  Emotional Status Pt's affect, behavior and adjustment status: Pt in good spirits during visit Recent Psychosocial Issues: Denies Psychiatric History: hx of anxiety/depression after passing of her husband in 2020. Pt states she has been in counseling with a provider with [facility] Substance Abuse History: Denies  Patient / Family Perceptions, Expectations & Goals Pt/Family understanding of illness & functional limitations: Pt and pt dtr have a general understanding of her medical condition Premorbid pt/family roles/activities: Independent Anticipated changes in roles/activities/participation: Assistance with ADLs/some IADLs Pt/family expectations/goals: "work on Insurance underwriter."  Recruitment consultant: None Premorbid Home Care/DME Agencies: None Transportation available at discharge: daughter Resource referrals recommended: Neuropsychology  Discharge Planning Living Arrangements: Children Support Systems: Children Type of Residence: Private residence Insurance Resources: Multimedia programmer (specify)(BCBS) Financial Resources: Employment, SSI Financial Screen Referred: No Living Expenses: Own Does the patient have any problems obtaining your medications?: No Home Management: Pt was managing finances/housekeeping Patient/Family Preliminary Plans: Pt plans to have assistance with housekeeping/transportation Care Coordinator Barriers to Discharge: Decreased caregiver support, Lack of/limited family support Care Coordinator Anticipated Follow Up Needs: HH/OP Expected length of stay: 8-14 days  Clinical Impression SW met with pt in room to introduce self, explain role, and discuss discharge process. Pt reports her dtr Almyra Free currently lives in [place] and will be moving  back to care for her. States her dtr will be here on Friday. Pt is not a English as a second language teacher. No HCPOA forms, but pt states her HCPOA is her dtr [employer]. DME: RW and SPC (cane is here in hospital room). No HHA.   SW called pt dtr [name] [phone]) to introduce self, explain role, and discuss discharge process. She confirms she will be returning to care for pt and is dedicating the next couple of months to provide care. Would like to know how much support pt will require long term, and requested pt be seen by neuropsych. SW informed pt on neuropsych list due to hx of MH. SW to provide updates after team conference.   Orchid Glassberg A Kenda Kloehn 02/07/2020, 12:13 PM

## 2020-02-07 NOTE — Progress Notes (Signed)
Inpatient Rehabilitation Center Individual Statement of Services  Patient Name:  Cristina Conner  Date:  02/07/2020  Welcome to the [facility].  Our goal is to provide you with an individualized program based on your diagnosis and situation, designed to meet your specific needs.  With this comprehensive rehabilitation program, you will be expected to participate in at least 3 hours of rehabilitation therapies Monday-Friday, with modified therapy programming on the weekends.  Your rehabilitation program will include the following services:  Physical Therapy (PT), Occupational Therapy (OT), Speech Therapy (ST), 24 hour per day rehabilitation nursing, Therapeutic Recreaction (TR), Psychology, Neuropsychology, Care Coordinator, Rehabilitation Medicine, Nutrition Services, Pharmacy Services and Other  Weekly team conferences will be held on Tuesdays to discuss your progress.  Your Inpatient Rehabilitation Care Coordinator will talk with you frequently to get your input and to update you on team discussions.  Team conferences with you and your family in attendance may also be held.  Expected length of stay: 8-14 days   Overall anticipated outcome: Minimal Assistance  Depending on your progress and recovery, your program may change. Your Inpatient Rehabilitation Care Coordinator will coordinate services and will keep you informed of any changes. Your Inpatient Rehabilitation Care Coordinator's name and contact numbers are listed  below.  The following services may also be recommended but are not provided by the [facility] will be made to provide these services after discharge if needed.  Arrangements include referral to agencies that provide these services.  Your insurance has been verified to be:  [place]  Your primary  doctor is:  Lorrene Reid  Pertinent information will be shared with your doctor and your insurance company.  Inpatient Rehabilitation Care Coordinator:  Cathleen Corti [phone] or ([phone]  Information discussed with and copy given to patient by: Rana Snare, 02/07/2020, 9:35 AM

## 2020-02-07 NOTE — Plan of Care (Signed)
  Problem: Consults Goal: RH BRAIN INJURY PATIENT EDUCATION Description: Description: See Patient Education module for eduction specifics Outcome: Progressing Goal: Skin Care Protocol Initiated - if Braden Score 18 or less Description: If consults are not indicated, leave blank or document N/A Outcome: Progressing   Problem: RH BOWEL ELIMINATION Goal: RH STG MANAGE BOWEL WITH ASSISTANCE Description: STG Manage Bowel with min Assistance. Outcome: Progressing   Problem: RH BLADDER ELIMINATION Goal: RH STG MANAGE BLADDER WITH EQUIPMENT WITH ASSISTANCE Description: STG Manage Bladder With Equipment With min Assistance Outcome: Progressing   Problem: RH SKIN INTEGRITY Goal: RH STG SKIN FREE OF INFECTION/BREAKDOWN Description: Patient will not have signs & symptoms of infection to incisions while on CIR Outcome: Progressing Goal: RH STG MAINTAIN SKIN INTEGRITY WITH ASSISTANCE Description: STG Maintain Skin Integrity With mod I Assistance. Outcome: Progressing   Problem: RH SAFETY Goal: RH STG ADHERE TO SAFETY PRECAUTIONS W/ASSISTANCE/DEVICE Description: STG Adhere to Safety Precautions With Assistance/Device. Outcome: Progressing   Problem: RH COGNITION-NURSING Goal: RH STG ANTICIPATES NEEDS/CALLS FOR ASSIST W/ASSIST/CUES Description: STG Anticipates Needs/Calls for Assist With Assistance/Cues. Outcome: Progressing   Problem: RH PAIN MANAGEMENT Goal: RH STG PAIN MANAGED AT OR BELOW PT'S PAIN GOAL Description: Pain scale <4/10 Outcome: Progressing   Problem: RH KNOWLEDGE DEFICIT BRAIN INJURY Goal: RH STG INCREASE KNOWLEDGE OF DYSPHAGIA/FLUID INTAKE Outcome: Progressing

## 2020-02-07 NOTE — Progress Notes (Signed)
Speech Language Pathology Daily Session Note  Patient Details  Name: Cristina Conner MRN: [id number] Date of Birth: [birth year]  Today's Date: 02/07/2020 SLP Individual Time: 0930-1030 SLP Individual Time Calculation (min): 60 min  Short Term Goals: Week 1: SLP Short Term Goal 1 (Week 1): Patient will consume trials of regular textures with efficient mastication and complete oral clearance X 2 sessions without overt s/s of aspiration and supervision level verbal cues for use of compensatory strateiges prior to upgrade. SLP Short Term Goal 2 (Week 1): Patient will utilize speech intelligibility strategies to achieve 100% intelligibility at the conversation level with Mod I. SLP Short Term Goal 3 (Week 1): Patient will demonstrate complex problem solving for functional tasks with supervision verbal cues. SLP Short Term Goal 4 (Week 1): Patient will recall new, functional information with supervision verbal and visual cues.  Skilled Therapeutic Interventions: Skilled treatment session focused on dysphagia and cognitive goals. SLP facilitated session by providing skilled observation with a snack of regular textures (cheetos). Patient demonstrated efficient mastication and complete oral clearance without overt s/s of aspiration and overall Mod I for use of swallowing compensatory strategies.  Suspect patient is safe to upgrade to regular textures, however, patient reports she prefers Dys. 3 textures at this time. SLP also facilitated session by providing overall supervision level verbal cues for organization during a complex scheduling and calendar task. Function with task was also impacted by visual deficits. Patient ambulated to the bathroom with the RW with overall supervision and was continent of urine. Patient left supine in bed at end of session with alarm on and all needs within reach. Continue with current plan of care.      Pain No/Denies Pain  Therapy/Group: Individual Therapy  Bryley Chrisman,  Gwenivere Hiraldo 02/07/2020, 10:32 AM

## 2020-02-07 NOTE — Progress Notes (Signed)
[facility] PHYSICAL MEDICINE & REHABILITATION PROGRESS NOTE   Subjective/Complaints: No new issues. Refused heparin because of experiences her husband had receiving a lot of different "shots" for his health care.   ROS: Patient denies fever, rash, sore throat,   nausea, vomiting, diarrhea, cough, shortness of breath or chest pain, joint or back pain, headache, or mood change.   Objective:   No results found. Recent Labs    02/07/20 1005  WBC 7.9  HGB 9.2*  HCT 30.9*  PLT 594*   No results for input(s): NA, K, CL, CO2, GLUCOSE, BUN, CREATININE, CALCIUM in the last 72 hours.  Intake/Output Summary (Last 24 hours) at 02/07/2020 1036 Last data filed at 02/07/2020 0900 Gross per 24 hour  Intake 718 ml  Output --  Net 718 ml     Physical Exam: Vital Signs Blood pressure 131/82, pulse 85, temperature 98.6 F (37 C), temperature source Oral, resp. rate 16, height 5\' 6"  (1.676 m), weight 55.9 kg, last menstrual period 05/01/2009, SpO2 100 %. Constitutional: No distress . Vital signs reviewed. HEENT: EOMI, oral membranes moist. Left eye irritated Neck: supple Cardiovascular: RRR without murmur. No JVD    Respiratory/Chest: CTA Bilaterally without wheezes or rales. Normal effort    GI/Abdomen: BS +, non-tender, non-distended Ext: no clubbing, cyanosis, or edema Psych: pleasant and cooperative Skin: scalp wounds intact.  Musc: No edema in extremities.  No tenderness in extremities. Neuro: Alert Left facial weakness, can't close left eye, VI nerve weakness Motor: Grossly 4+/5 throughout, unchanged  Assessment/Plan: 1. 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  Physiatrist is providing close team supervision and 24 hour management of active medical problems listed below.  Physiatrist and rehab team continue to assess barriers to discharge/monitor patient progress  toward functional and medical goals  Care Tool:  Bathing    Body parts bathed by patient: Right arm, Left arm, Chest, Abdomen, Front perineal area, Buttocks, Right upper leg, Left upper leg, Right lower leg, Left lower leg, Face         Bathing assist Assist Level: Contact Guard/Touching assist     Upper Body Dressing/Undressing Upper body dressing   What is the patient wearing?: Pull over shirt, Bra    Upper body assist Assist Level: Supervision/Verbal cueing    Lower Body Dressing/Undressing Lower body dressing      What is the patient wearing?: Pants     Lower body assist Assist for lower body dressing: Contact Guard/Touching assist     Toileting Toileting    Toileting assist Assist for toileting: Supervision/Verbal cueing     Transfers Chair/bed transfer  Transfers assist     Chair/bed transfer assist level: Contact Guard/Touching assist     Locomotion Ambulation   Ambulation assist      Assist level: Minimal Assistance - Patient > 75% Assistive device: No Device Max distance: 128ft   Walk 10 feet activity   Assist     Assist level: Minimal Assistance - Patient > 75% Assistive device: No Device   Walk 50 feet activity   Assist Walk 50 feet with 2 turns activity did not occur: Safety/medical concerns  Assist level: Minimal Assistance - Patient > 75% Assistive device: No Device    Walk 150 feet activity   Assist Walk 150 feet activity did not occur: Safety/medical concerns         Walk 10 feet on uneven surface  activity  Assist Walk 10 feet on uneven surfaces activity did not occur: Safety/medical concerns         Wheelchair     Assist Will patient use wheelchair at discharge?: No             Wheelchair 50 feet with 2 turns activity    Assist            Wheelchair 150 feet activity     Assist          Blood pressure 131/82, pulse 85, temperature 98.6 F (37 C), temperature source  Oral, resp. rate 16, height 5\' 6"  (1.676 m), weight 55.9 kg, last menstrual period 05/01/2009, SpO2 100 %.  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  Antithrombotics: -DVT/anticoagulation:  she is ambulatory: dc sq Heparin             -antiplatelet therapy: N/a 3. Headaches/Pain Management: Worse at nights--continue hydrocodone prn.  Continue tylenol qid--decreased to 650 mg.   Controlled on 6/7 4. Mood: LCSW to follow for evaluation and support.  -ego support by team. Seems to be in good spirits at present  -on prozac and buspar for anxiety/depression. (off remeron currently)              -antipsychotic agents: N/A 5. Neuropsych: This patient is capable of making decisions on her own behalf. 6. Skin/Wound Care: Monitor for healing. Routine pressure relief measures.   -continue sutures/staples on scalp for now 7. Fluids/Electrolytes/Nutrition: Monitor I/O.  8. Left VII nerve injury: Continue eye drops 5 X day and patch at night/when asleep.    Keep eye covered,lubricated --reviewed again with her today 9. Resting tachycardia: Continue metoprolol and montinue for now. Multifactorial due to surgery, debility, anemia.   Overall controlled 10. ABLA: Iron dextran 5/23--continue iron supplement.  Hemoglobin 8.2 on 6/4  Continue to monitor 11. HTN:   Controlled on 6/7 13. Left hip pain s/p THR 12/20:   Hip x-ray showing left hip arthroplasty  Controlled on 6/7 14. Small bowel RSXN: Continue Bentyl. Has been having BMs- stopped miralax (has been refusing) as colace bid effective.  15. Vestibular dysfunction: N/V have resolved.   Vestibular evaluation ordered but treatment will not likely help given central origin of sx   LOS: 4 days A FACE TO FACE EVALUATION WAS PERFORMED  Meredith Staggers 02/07/2020, 10:36 AM

## 2020-02-08 ENCOUNTER — Inpatient Hospital Stay (HOSPITAL_COMMUNITY): Payer: BC Managed Care – PPO

## 2020-02-08 ENCOUNTER — Inpatient Hospital Stay (HOSPITAL_COMMUNITY): Payer: BC Managed Care – PPO | Admitting: Speech Pathology

## 2020-02-08 ENCOUNTER — Inpatient Hospital Stay (HOSPITAL_COMMUNITY): Payer: BC Managed Care – PPO | Admitting: Physical Therapy

## 2020-02-08 NOTE — Progress Notes (Signed)
Patient ID: Cristina Conner, female   DOB: November 29, 1960, 59 y.o.   MRN: [id number]   SW met with pt in room to provide updates from team conference, and d/c date 6/12. Pt called her dtr [name] while SW present. Pt aware she is appropriate for OP PT/OT/ST. SW to send referral to [facility]. SW will provide updates on further recommendations. No questions/concerns reported.   Loralee Pacas, MSW, [place] Office: [phone] Cell: [phone] Fax: [phone]

## 2020-02-08 NOTE — Progress Notes (Signed)
Occupational Therapy Session Note  Patient Details  Name: Cristina Conner MRN: [id number] Date of Birth: [birth year]  Today's Date: 02/08/2020 OT Individual Time: 4818-5909 OT Individual Time Calculation (min): 60 min    Short Term Goals: Week 1:  OT Short Term Goal 1 (Week 1): STG=LTG d.t ELOS  Skilled Therapeutic Interventions/Progress Updates:    Pt received sitting up in the recliner with no c/o pain. Agreeable to shower. Pt completed functional mobility around the room without any AD with min guard. Pt demonstrated good carryover of previous safety cues, sitting to doff LB clothing. Discussed hand rails for the shower at home and setting up shower for safety. Pt completed all bathing sit <> stand with use of shower chair with close (S). Pt donned shirt with (S) and pants with CGA. Pt stood at sink and completed oral care and grooming tasks with close (S). Pt completed Wii Fit activity with stepping on and off board with poor coordination and min A overall for balance. Pt reported fatigue and took extended rest break. Pt completed 200 ft of functional mobility to the therapy gym to review grab bar recommended for home use. Pt returned to her room and was set up to eat breakfast. Chair pad alarm set and safety plan updated.   Therapy Documentation Precautions:  Precautions Precautions: Fall Precaution Comments: left leg weakness, imbalance, double vision (has corrective glasses) Restrictions Weight Bearing Restrictions: No  Therapy/Group: Individual Therapy  Curtis Sites 02/08/2020, 6:34 AM

## 2020-02-08 NOTE — Plan of Care (Signed)
  Problem: Consults Goal: RH BRAIN INJURY PATIENT EDUCATION Description: Description: See Patient Education module for eduction specifics Outcome: Progressing Goal: Skin Care Protocol Initiated - if Braden Score 18 or less Description: If consults are not indicated, leave blank or document N/A Outcome: Progressing   Problem: RH BOWEL ELIMINATION Goal: RH STG MANAGE BOWEL WITH ASSISTANCE Description: STG Manage Bowel with min Assistance. Outcome: Progressing   Problem: RH BLADDER ELIMINATION Goal: RH STG MANAGE BLADDER WITH EQUIPMENT WITH ASSISTANCE Description: STG Manage Bladder With Equipment With min Assistance Outcome: Progressing   Problem: RH SKIN INTEGRITY Goal: RH STG SKIN FREE OF INFECTION/BREAKDOWN Description: Patient will not have signs & symptoms of infection to incisions while on CIR Outcome: Progressing Goal: RH STG MAINTAIN SKIN INTEGRITY WITH ASSISTANCE Description: STG Maintain Skin Integrity With mod I Assistance. Outcome: Progressing   Problem: RH SAFETY Goal: RH STG ADHERE TO SAFETY PRECAUTIONS W/ASSISTANCE/DEVICE Description: STG Adhere to Safety Precautions With Assistance/Device. Outcome: Progressing   Problem: RH COGNITION-NURSING Goal: RH STG ANTICIPATES NEEDS/CALLS FOR ASSIST W/ASSIST/CUES Description: STG Anticipates Needs/Calls for Assist With Assistance/Cues. Outcome: Progressing   Problem: RH PAIN MANAGEMENT Goal: RH STG PAIN MANAGED AT OR BELOW PT'S PAIN GOAL Description: Pain scale <4/10 Outcome: Progressing   Problem: RH KNOWLEDGE DEFICIT BRAIN INJURY Goal: RH STG INCREASE KNOWLEDGE OF DYSPHAGIA/FLUID INTAKE Outcome: Progressing

## 2020-02-08 NOTE — Patient Care Conference (Signed)
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[place])  Patient Active Problem List   Diagnosis Date Noted   Hypoalbuminemia due to protein-calorie malnutrition ([place])    Acute blood loss anemia    Sinus tachycardia    Postoperative pain    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[place]) 02/03/2020   Protein-calorie malnutrition, severe 01/30/2020   Brain tumor ([place]) 01/27/2020   Low folate 01/23/2020   Anemia of chronic disease 01/23/2020   Small bowel obstruction from retained endoscopy capsule at stricture s/p ileal resection 01/19/2020 01/18/2020   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 2020 09/07/2019   Hemoglobin low 09/07/2019   Primary localized osteoarthritis of left hip 2019/08/31   Death of family member-  husband passed Jul 2020 04/29/2019   Reactive depression 04/01/2019   Sleep difficulties 09/09/2018   Pre-diabetes 03/09/2018   Postmenopausal state- went thru early 40's 01/02/2018   Primary osteoarthritis of left hip 01/02/2018   Caregiver burden- for husband with ALLeukemia 01/02/2018   Left hip pain 01/02/2018   Osteopenia 07/31/2016   Hypertension 05/20/2011   Menopausal state 05/20/2011    Expected Discharge Date: Expected Discharge Date: 02/12/20  Team Members Present: Physician leading conference: Dr. Alger Simons Care Coodinator Present: Loralee Pacas, LCSWA;Other (comment)(Cambryn Charters Creig Hines, RN, BSN, CRRN) Nurse Present: Other (comment)(Nikki Fernande Boyden) PT Present: Apolinar Junes, PT OT Present: Laverle Hobby, OT SLP Present: Weston Anna,  SLP PPS Coordinator present : Gunnar Fusi, SLP     Current Status/Progress Goal Weekly Team Focus  Bowel/Bladder   Continent of bowel/bladder.  To remain continent.  Assess tolieting needs often   Swallow/Nutrition/ Hydration   Dys. 3 textures with thin liquids, Mod I (per her preference  Mod I  ongoing trials of regular textures, upgraded when patient is comfortable   ADL's   CGA transfers, (S) UB ADLs, CGA LB ADLs. Improved activity tolerance and balance  mod I to set up assist  Functional activity tolerance, dynamic standing balance, ADL/IADL retraining   Mobility   Mod I bed mobility, CGA-close supervision transfers, CGA gait up to 440 ft with RW, mod A 4 stairs with B rails  Mod I overall, supervision community mobility  Balance, functional mobility, gait and stair training, NMR, strengthening, activity tolerance, patient/caregiver education   Communication   Supervision-Mod I  Mod I  use of a slow rate to improve articulation   Safety/Cognition/ Behavioral Observations  Supervision-Min A  complex problem solving, complex organization, recall      Pain   Pain is 5/10 (Headache); gets tylenol scheduled.  To lower pain level below 2/10.  Assess pain q shift or prn.   Skin   Ecchymosis to left hip, sutures/skin glue to left side of the head, and skin glue to abdomen/scars.  To remain free of infection, promote healing and prevent breakdown.  Assess skin q shift or prn.    Rehab Goals Patient on target to meet rehab goals: Yes *See Care Plan and progress notes for long and short-term goals.     Barriers to Discharge  Current Status/Progress  Possible Resolutions Date Resolved   Nursing                  PT  Home environment access/layout;Lack of/limited family support;Behavior  Patient has 2 STE her home, patient lives alone, her daughter will not be available to assist until Firday, patient with new cognitive deficits and decreased safety awareness  Functional mobility and  cognition improving, Patient's daughter returns on Friday.           OT Decreased caregiver support  Daughter will come to stay with pt             SLP     visual-perceptual deficits          Care Coordinator Decreased caregiver support;Lack of/limited family support              Discharge Planning/Teaching Needs:  Pt dtr [name] coming from [place] to care for pt and is dedicating the next couple of months to provide care pt will need.  Family education as recommended by therapy   Team Discussion:  Pt & OT reporting patient is progressing towards discharge goals. ST states pt is on D3/thin per pt preference. Ongoing trials of reg textures and upgrade as pt is comfortable.  Revisions to Treatment Plan:  n/a    Medical Summary Current Status: vestibular schwannoma, dysphagia, balance/gait deficits. left eye irritated Weekly Focus/Goal: eye care, nutrition, mood  Barriers to Discharge: Medical stability   Possible Resolutions to Barriers: regular lab work, preventative measures to care for left eye, pain control, maximize nutrition   Continued Need for Acute Rehabilitation Level of Care: The patient requires daily medical management by a physician with specialized training in physical medicine and rehabilitation for the following reasons: Direction of a multidisciplinary physical rehabilitation program to maximize functional independence : Yes Medical management of patient stability for increased activity during participation in an intensive rehabilitation regime.: Yes Analysis of laboratory values and/or radiology reports with any subsequent need for medication adjustment and/or medical intervention. : Yes   I attest that I was present, lead the team conference, and concur with the assessment and plan of the team.   [name] 02/08/2020, 1:54 PM

## 2020-02-08 NOTE — Progress Notes (Signed)
Speech Language Pathology Daily Session Note  Patient Details  Name: Cristina Conner MRN: [id number] Date of Birth: Dec 19, 1960  Today's Date: 02/08/2020 SLP Individual Time: 73-1525 SLP Individual Time Calculation (min): 55 min  Short Term Goals: Week 1: SLP Short Term Goal 1 (Week 1): Patient will consume trials of regular textures with efficient mastication and complete oral clearance X 2 sessions without overt s/s of aspiration and supervision level verbal cues for use of compensatory strateiges prior to upgrade. SLP Short Term Goal 2 (Week 1): Patient will utilize speech intelligibility strategies to achieve 100% intelligibility at the conversation level with Mod I. SLP Short Term Goal 3 (Week 1): Patient will demonstrate complex problem solving for functional tasks with supervision verbal cues. SLP Short Term Goal 4 (Week 1): Patient will recall new, functional information with supervision verbal and visual cues.  Skilled Therapeutic Interventions: Skilled treatment session focused on cognitive goals. SLP facilitated session by providing supervision level verbal cues for recall of her current medications and overall Min A verbal cues for error awareness during a complex medication management task while organizing a 4x/week pill box in a moderately distracting environment. Patient requested to use the bathroom and ambulated with the RW with supervision. Patient was continent of urine. Patient was left upright recliner with alarm on and all needs within reach. Continue with current plan of care.      Pain No/Denies Pain   Therapy/Group: Individual Therapy  Lilliana Turner 02/08/2020, 3:31 PM

## 2020-02-08 NOTE — Progress Notes (Signed)
Physical Therapy Session Note  Patient Details  Name: Cristina Conner MRN: [id number] Date of Birth: [birth year]  Today's Date: 02/09/2020 PT Individual Time: 1000-1045     Short Term Goals: Week 1:  PT Short Term Goal 1 (Week 1): STGs=LTGs  Skilled Therapeutic Interventions/Progress Updates: Pt presented in recliner agreeable to therapy. Pt denies pain during session. PTA aware that pt has clothes in dryer thus pt performed STS with close S and ambulated without AD CGA to laundry closet. PTA opened dryer door and pt was able to perform reaching activity with LUE support on washer to pick up clothes out of dryer with supervision. Pt was then able to carry clothes back to room with CGA. Pt able to drape clothes over chairs in room with appropriate use of BUE with close S. Pt then ambulated to day room with CGA no AD with cues for cadence and increasing stride length. Pt then participated in coordination/balance activities including toe taps to target then attempting multiple targets progressing to targets on 6in step with some mild instability noted. Pt then participated in x 2 bouts of corn hole while standing on [employer] with PTA providing min multimodal cues to increase forefoot wt bearing (as pt noted to lift up toes) with pt demonstrating improvement with repetitions. Pt then participated in gait without AD through obstacle course including weaving in and around cones with pt initially with shortened step length and guarded posture however improving with verbal cues and increased distance. Pt ambulated back to room at end of session with CGA and returned to recliner. Pt left with chair alarm on, call bell within reach and needs met.      Therapy Documentation Precautions:  Precautions Precautions: Fall Precaution Comments: left leg weakness, imbalance, double vision (has corrective glasses) Restrictions Weight Bearing Restrictions: No General:   Vital Signs:   Pain:   Mobility:    Locomotion :    Trunk/Postural Assessment :    Balance:   Exercises:   Other Treatments:      Therapy/Group: Individual Therapy  Deneka Greenwalt 02/09/2020, 8:07 AM

## 2020-02-08 NOTE — Progress Notes (Signed)
Physical Therapy Session Note  Patient Details  Name: Cristina Conner MRN: [id number] Date of Birth: 1960/09/13  Today's Date: 02/08/2020 PT Individual Time: 0915-0958 PT Individual Time Calculation (min): 43 min   Short Term Goals: Week 1:  PT Short Term Goal 1 (Week 1): STGs=LTGs  Skilled Therapeutic Interventions/Progress Updates:     Patient in recliner in the room upon PT arrival. Patient alert and agreeable to PT session. Patient denied pain during session.  Therapeutic Activity: Transfers: Patient performed sit to/from stand from recliner, mat table, arm chair, and toilet with supervision for safety without AD. Provided verbal cues for foot placement and forward weight shift x2. Patient transferred her laundry from the washer to the dryer in standing during session with close supervision for safety and performed IADL task independently.   Gait Training:  Patient ambulated 250 feet, 142 feet, and 108 feet without and AD with CGA and intermittent min A for minor LOB. Ambulated with increased BOS, decreased B step length and height, B toe out, and decreased foot clearance with intermittent shuffling gait. Provided verbal cues for longer strides, increased arm swing, and increased gait speed, noted significant improvement with technique following NMR. Patient ascended/descended 12 steps using 1 rail on first 4 steps with CGA and no rails on remaining 8 steps with CGA ascending and CGA-min A on descent. Performed step-to gait pattern leading with R whille ascending and L while descending first 8 steps, then switched to leading with L while ascending and R while descending on last 4 steps per PT cues for increased L LE strengthening with functional activity. Provided cues for technique and sequencing.   Neuromuscular Re-ed: Patient performed the following standing balance activities: -B SLS x12, focused quad and gluteal activation for stabilization for improved stance phase of gait,  progressed from B UE holding 2-3 seconds to no UE support holding 5 sec standing on R or L LE, required close supervision for safety and min A x2 due to LOB throughout  PT provided demonstration of SLS in gait after and demonstrated increased stride length with prolonged stance phase. Also educated on decreased SLS time with increased gait speed resulting in decreased fall risk. Patient receptive and attentive throughout education/demonstration.  Patient in recliner in the room at end of session with breaks locked, chair alarm set, and all needs within reach.    Therapy Documentation Precautions:  Precautions Precautions: Fall Precaution Comments: left leg weakness, imbalance, double vision (has corrective glasses) Restrictions Weight Bearing Restrictions: No    Therapy/Group: Individual Therapy  Markeese Boyajian L Mylynn Dinh PT, DPT  02/08/2020, 4:34 PM

## 2020-02-08 NOTE — Progress Notes (Signed)
[facility] PHYSICAL MEDICINE & REHABILITATION PROGRESS NOTE   Subjective/Complaints: Had a fair night. Denies any new problems. Pleased that she walked without a walker with therapy  ROS: Patient denies fever, rash, sore throat, blurred vision, nausea, vomiting, diarrhea, cough, shortness of breath or chest pain, joint or back pain, headache, or mood change.   Objective:   No results found. Recent Labs    02/07/20 1005  WBC 7.9  HGB 9.2*  HCT 30.9*  PLT 594*   Recent Labs    02/07/20 1005  NA 138  K 3.8  CL 105  CO2 24  GLUCOSE 108*  BUN 11  CREATININE 0.75  CALCIUM 9.4    Intake/Output Summary (Last 24 hours) at 02/08/2020 0952 Last data filed at 02/07/2020 1900 Gross per 24 hour  Intake 170 ml  Output --  Net 170 ml     Physical Exam: Vital Signs Blood pressure 118/78, pulse 87, temperature 97.6 F (36.4 C), temperature source Oral, resp. rate 16, height 5\' 6"  (1.676 m), weight 55.9 kg, last menstrual period 05/01/2009, SpO2 100 %. Constitutional: No distress . Vital signs reviewed. HEENT: EOMI, oral membranes moist, sclera OS irritated. Neck: supple Cardiovascular: RRR without murmur. No JVD    Respiratory/Chest: CTA Bilaterally without wheezes or rales. Normal effort    GI/Abdomen: BS +, non-tender, non-distended Ext: no clubbing, cyanosis, or edema Psych: pleasant and cooperative Skin: scalp wounds intact.  Musc: No edema in extremities.  No tenderness in extremities. Neuro: Alert Left facial weakness, can't close left eye but able to move lid toward closing, VI nerve weakness Motor: Grossly 4+/5 throughout, unchanged  Assessment/Plan: 1. 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  Physiatrist is providing close team supervision and 24 hour management of active medical problems listed below.  Physiatrist and rehab team continue to assess  barriers to discharge/monitor patient progress toward functional and medical goals  Care Tool:  Bathing    Body parts bathed by patient: Right arm, Left arm, Chest, Abdomen, Front perineal area, Buttocks, Right upper leg, Left upper leg, Right lower leg, Left lower leg, Face         Bathing assist Assist Level: Contact Guard/Touching assist     Upper Body Dressing/Undressing Upper body dressing   What is the patient wearing?: Pull over shirt, Bra    Upper body assist Assist Level: Supervision/Verbal cueing    Lower Body Dressing/Undressing Lower body dressing      What is the patient wearing?: Pants     Lower body assist Assist for lower body dressing: Contact Guard/Touching assist     Toileting Toileting    Toileting assist Assist for toileting: Supervision/Verbal cueing     Transfers Chair/bed transfer  Transfers assist     Chair/bed transfer assist level: Supervision/Verbal cueing Chair/bed transfer assistive device: Programmer, multimedia   Ambulation assist      Assist level: Contact Guard/Touching assist Assistive device: Walker-rolling Max distance: 440 ft   Walk 10 feet activity   Assist     Assist level: Contact Guard/Touching assist Assistive device: Walker-rolling   Walk 50 feet activity   Assist Walk 50 feet with 2 turns activity did not occur: Safety/medical concerns  Assist level: Contact Guard/Touching assist Assistive device: Walker-rolling    Walk 150 feet activity   Assist Walk 150 feet activity did not occur: Safety/medical concerns  Assist level: Contact Guard/Touching assist Assistive  device: Walker-rolling    Walk 10 feet on uneven surface  activity   Assist Walk 10 feet on uneven surfaces activity did not occur: Safety/medical concerns         Wheelchair     Assist Will patient use wheelchair at discharge?: No             Wheelchair 50 feet with 2 turns activity    Assist             Wheelchair 150 feet activity     Assist          Blood pressure 118/78, pulse 87, temperature 97.6 F (36.4 C), temperature source Oral, resp. rate 16, height 5\' 6"  (1.676 m), weight 55.9 kg, last menstrual period 05/01/2009, SpO2 100 %.  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  Antithrombotics: -DVT/anticoagulation:  she is ambulatory              -antiplatelet therapy: N/a 3. Headaches/Pain Management: Worse at nights--continue hydrocodone prn.  Continue tylenol qid--decreased to 650 mg.   Controlled on 6/8 4. Mood: LCSW to follow for evaluation and support.  -ego support by team. Seems to be in good spirits at present  -on prozac and buspar for anxiety/depression. (off remeron currently)              -antipsychotic agents: N/A 5. Neuropsych: This patient is capable of making decisions on her own behalf. 6. Skin/Wound Care: Monitor for healing. Routine pressure relief measures.   -continue sutures/staples on scalp for now--remove later this week  7. Fluids/Electrolytes/Nutrition: Monitor I/O.  8. Left VII nerve injury: Continue eye drops 5 X day and patch at night/when asleep.    Keep eye covered,lubricated as much as possible. Could just tape eye lid 9. Resting tachycardia: Continue metoprolol and montinue for now. Multifactorial due to surgery, debility, anemia.   Overall controlled 6/8 10. ABLA: Iron dextran 5/23--continue iron supplement.  Hemoglobin 9.2 6/7  Continue to monitor 11. HTN:   Controlled on 6/8 13. 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 Small bowel RSXN: Continue Bentyl. Has been having BMs- stopped miralax (has been refusing) as colace bid effective.  15. Vestibular dysfunction: N/V have resolved.         LOS: 5 days A FACE TO FACE EVALUATION WAS PERFORMED  Meredith Staggers 02/08/2020, 9:52 AM

## 2020-02-09 ENCOUNTER — Inpatient Hospital Stay (HOSPITAL_COMMUNITY): Payer: BC Managed Care – PPO

## 2020-02-09 ENCOUNTER — Encounter (HOSPITAL_COMMUNITY): Payer: BC Managed Care – PPO | Admitting: Psychology

## 2020-02-09 ENCOUNTER — Inpatient Hospital Stay (HOSPITAL_COMMUNITY): Payer: BC Managed Care – PPO | Admitting: Speech Pathology

## 2020-02-09 MED ORDER — ADULT MULTIVITAMIN W/MINERALS CH: 1.0000 | ORAL_TABLET | Freq: Every day | ORAL | Status: DC

## 2020-02-09 MED ORDER — ACETAMINOPHEN 500 MG PO TABS: 500.0000 mg | ORAL_TABLET | ORAL | 0 refills | Status: AC | PRN

## 2020-02-09 MED ORDER — ENSURE ENLIVE PO LIQD: 237.0000 mL | Freq: Every day | ORAL | Status: DC | PRN

## 2020-02-09 MED ORDER — POLYVINYL ALCOHOL 1.4 % OP SOLN: 1.0000 [drp] | OPHTHALMIC | 0 refills | Status: DC

## 2020-02-09 NOTE — Progress Notes (Signed)
Speech Language Pathology Daily Session Note  Patient Details  Name: Cristina Conner MRN: [id number] Date of Birth: 1961/04/10  Today's Date: 02/09/2020 SLP Individual Time: 1305-1400 SLP Individual Time Calculation (min): 55 min  Short Term Goals: Week 1: SLP Short Term Goal 1 (Week 1): Patient will consume trials of regular textures with efficient mastication and complete oral clearance X 2 sessions without overt s/s of aspiration and supervision level verbal cues for use of compensatory strateiges prior to upgrade. SLP Short Term Goal 2 (Week 1): Patient will utilize speech intelligibility strategies to achieve 100% intelligibility at the conversation level with Mod I. SLP Short Term Goal 3 (Week 1): Patient will demonstrate complex problem solving for functional tasks with supervision verbal cues. SLP Short Term Goal 4 (Week 1): Patient will recall new, functional information with supervision verbal and visual cues.  Skilled Therapeutic Interventions: Skilled treatment session focused on cognitive goals. SLP facilitated session by providing extra time and overall supervision level verbal cues for recall and problem solving during a complex task of locating the [facility] website, finding an application and helping the SLP fill out the application while verbalizing all pertinent information for membership (job prior to hospitalization). Patient intermittently "lost her train of thought' but was able to use visual aids to facilitate recall of information. SLP also provided education regarding use of an outline or script to ensure recall of information that she needs to verbalize during time of sign-up, especially in a more distracting work environment. Patient unable to recall or locate information to a particular question, therefore, she independently utilized her cell phone to call her manager to request information. Patient left upright in bed with alarm on and all needs within reach. Continue with  current plan of care.      Pain No/Denies Pain   Therapy/Group: Individual Therapy  Granger Chui 02/09/2020, 3:14 PM

## 2020-02-09 NOTE — Progress Notes (Addendum)
Patient ID: Cristina Conner, female   DOB: 04-Jun-1961, 59 y.o.   MRN: [id number]  Per neuropsych for pt to have follow-up with previous [place] provider. EMR shows Apolonio Schneiders, MD. SW confirmed with pt this is the correct provider.   SW left message for [facility] [phone]) about scheduling a follow-up appointment.    SW ordered shower chair with back with [place] via parachute.   Loralee Pacas, MSW, [place] Office: [phone] Cell: [phone] Fax: [phone]

## 2020-02-09 NOTE — Progress Notes (Signed)
Occupational Therapy Session Note  Patient Details  Name: Cristina Conner MRN: [id number] Date of Birth: [birth year]  Today's Date: 02/09/2020 OT Individual Time: 9242-6834 OT Individual Time Calculation (min): 26 min    Short Term Goals: Week 1:  OT Short Term Goal 1 (Week 1): STG=LTG d.t ELOS  Skilled Therapeutic Interventions/Progress Updates:    Pt received in recliner, requesting assistance with ordering grab bars for home. Made recommendations for suction cup grab bars, as well as anti slip pads for shower floor. Pt assisted in ordering items. No c/o pain. Pt completed 200 ft of functional mobility with no AD, with CGA. Pt completed dynamic standing balance activity, completing mini split squat in a staggered stance to reduce BOS and increase core stabilization and balance demands. Pt required min guard throughout. Pt used 5 # dumbbells to complete full body strengthening/endurance activity for carryover to preferred IADLs. Pt returned to room and completed toileting with close (S).Pt was left sitting up with all needs met.    Therapy Documentation Precautions:  Precautions Precautions: Fall Precaution Comments: left leg weakness, imbalance, double vision (has corrective glasses) Restrictions Weight Bearing Restrictions: No   Therapy/Group: Individual Therapy  Curtis Sites 02/09/2020, 9:44 AM

## 2020-02-09 NOTE — Progress Notes (Signed)
[facility] PHYSICAL MEDICINE & REHABILITATION PROGRESS NOTE   Subjective/Complaints: Mrs. Cristina Conner requests a chocolate Ensure at night in case she is hungry after dinner. She notes some improvement in her appetite and likes the food here, but is concerned about how much weight she has lost- says that her weight was in 150s prior to admission.   ROS: Patient denies fever, rash, sore throat, blurred vision, nausea, vomiting, diarrhea, cough, shortness of breath or chest pain, joint or back pain, headache, or mood change.   Objective:   No results found. Recent Labs    02/07/20 1005  WBC 7.9  HGB 9.2*  HCT 30.9*  PLT 594*   Recent Labs    02/07/20 1005  NA 138  K 3.8  CL 105  CO2 24  GLUCOSE 108*  BUN 11  CREATININE 0.75  CALCIUM 9.4    Intake/Output Summary (Last 24 hours) at 02/09/2020 0901 Last data filed at 02/09/2020 0001 Gross per 24 hour  Intake 600 ml  Output --  Net 600 ml     Physical Exam: Vital Signs Blood pressure 125/80, pulse 88, temperature 97.7 F (36.5 C), temperature source Oral, resp. rate 18, height 5\' 6"  (1.676 m), weight 52.9 kg, last menstrual period 05/01/2009, SpO2 99 %. Constitutional: No distress . Vital signs reviewed. HEENT: EOMI, oral membranes moist, sclera OS irritated. Neck: supple Cardiovascular: RRR without murmur. No JVD    Respiratory/Chest: CTA Bilaterally without wheezes or rales. Normal effort    GI/Abdomen: BS +, non-tender, non-distended Ext: no clubbing, cyanosis, or edema Psych: pleasant and cooperative Skin: scalp wounds intact.  Musc: No edema in extremities.  No tenderness in extremities. Neuro: Alert Left facial weakness, can't close left eye but able to move lid toward closing, VI nerve weakness Motor: Grossly 4+/5 throughout, unchanged  Assessment/Plan: 1. 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  Physiatrist is providing close team supervision and 24 hour management of active medical problems listed below.  Physiatrist and rehab team continue to assess barriers to discharge/monitor patient progress toward functional and medical goals  Care Tool:  Bathing    Body parts bathed by patient: Right arm, Left arm, Chest, Abdomen, Front perineal area, Buttocks, Right upper leg, Left upper leg, Right lower leg, Left lower leg, Face         Bathing assist Assist Level: Contact Guard/Touching assist     Upper Body Dressing/Undressing Upper body dressing   What is the patient wearing?: Pull over shirt, Bra    Upper body assist Assist Level: Supervision/Verbal cueing    Lower Body Dressing/Undressing Lower body dressing      What is the patient wearing?: Pants     Lower body assist Assist for lower body dressing: Contact Guard/Touching assist     Toileting Toileting    Toileting assist Assist for toileting: Supervision/Verbal cueing     Transfers Chair/bed transfer  Transfers assist     Chair/bed transfer assist level: Supervision/Verbal cueing Chair/bed transfer assistive device: Programmer, multimedia   Ambulation assist      Assist level: Contact Guard/Touching assist Assistive device: No Device Max distance: 250'   Walk 10 feet activity   Assist     Assist level: Contact Guard/Touching assist Assistive device: No Device   Walk 50 feet activity   Assist Walk 50 feet with 2 turns activity did not occur: Safety/medical concerns  Assist level:  Contact Guard/Touching assist Assistive device: No Device    Walk 150 feet activity   Assist Walk 150 feet activity did not occur: Safety/medical concerns  Assist level: Contact Guard/Touching assist Assistive device: No Device    Walk 10 feet on uneven surface  activity   Assist Walk 10 feet on uneven surfaces activity did not occur: Safety/medical concerns          Wheelchair     Assist Will patient use wheelchair at discharge?: No             Wheelchair 50 feet with 2 turns activity    Assist            Wheelchair 150 feet activity     Assist          Blood pressure 125/80, pulse 88, temperature 97.7 F (36.5 C), temperature source Oral, resp. rate 18, height 5\' 6"  (1.676 m), weight 52.9 kg, last menstrual period 05/01/2009, SpO2 99 %.  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  Antithrombotics: -DVT/anticoagulation:  she is ambulatory              -antiplatelet therapy: N/a 3. Headaches/Pain Management: Worse at nights--continue hydrocodone prn.  Continue tylenol qid--decreased to 650 mg.   Controlled on 6/9.  4. Mood: LCSW to follow for evaluation and support.  -ego support by team. Seems to be in good spirits at present  -on prozac and buspar for anxiety/depression. (off remeron currently)              -antipsychotic agents: N/A 5. Neuropsych: This patient is capable of making decisions on her own behalf. 6. Skin/Wound Care: Monitor for healing. Routine pressure relief measures.   -continue sutures/staples on scalp for now--remove later this week  7. Fluids/Electrolytes/Nutrition: Monitor I/O. Added Ensure chocolate at night PRN in case she is hungry.  8. Left VII nerve injury: Continue eye drops 5 X day and patch at night/when asleep.  Keep eye covered,lubricated as much as possible. Could just tape eye lid 9. Resting tachycardia: Continue metoprolol and montinue for now. Multifactorial due to surgery, debility, anemia.   Overall controlled 6/9 10. ABLA: Iron dextran 5/23--continue iron supplement.  Hemoglobin 9.2 6/7  Continue to monitor 11. HTN:   Controlled on 6/9 13. 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 Small bowel RSXN: Continue Bentyl. Has been having BMs- stopped miralax (has been refusing) as colace bid effective.  15. Vestibular dysfunction: N/V have resolved.        LOS: 6 days A FACE TO FACE EVALUATION WAS PERFORMED  Cristina Conner 02/09/2020, 9:01 AM

## 2020-02-09 NOTE — Progress Notes (Signed)
Occupational Therapy Session Note  Patient Details  Name: Cristina Conner MRN: [id number] Date of Birth: [birth year]  Today's Date: 02/09/2020 OT Individual Time: 0700-0800 OT Individual Time Calculation (min): 60 min    Short Term Goals: Week 1:  OT Short Term Goal 1 (Week 1): STG=LTG d.t ELOS  Skilled Therapeutic Interventions/Progress Updates:    1;1. Pt received in bed agreeable to OT with RN in room and no pain reported. Pt with no tape on glasses reporting improvement in double vision. Pt finishes breakfast while discussing home set up in bathroom. OT uses yoga blocks to simulate stepping over shower ledge as at home with posterior method utilized. Pt able to return demo with S  Pt bathes, dresses and grooms at sit to stand level with S overall and min VC for hand palcement during transitional movement. Pt completes fxnl mobility with RW to tx gym with VC for proximity to RW and longer step length/greater weight shift to L during step through pattern.  Dynamometer- RUE 60# average (4# improvement) LUE-48.3# (11# improvement) 9HPT- RUE-24.5 (4 econds faster) LUE-39.7 (3 second improvement)  Pt returned to recliner with exit alarm on and call light in reach  Therapy Documentation Precautions:  Precautions Precautions: Fall Precaution Comments: left leg weakness, imbalance, double vision (has corrective glasses) Restrictions Weight Bearing Restrictions: No General:   Vital Signs:   Pain:   ADL: ADL Eating: Supervision/safety Grooming: Minimal assistance Where Assessed-Grooming: Standing at sink Upper Body Bathing: Supervision/safety Where Assessed-Upper Body Bathing: Shower Lower Body Bathing: Minimal assistance Where Assessed-Lower Body Bathing: Shower Upper Body Dressing: Supervision/safety Where Assessed-Upper Body Dressing: Sitting at sink Lower Body Dressing: Minimal assistance Where Assessed-Lower Body Dressing: Sitting at sink, Standing at sink Toileting:  Minimal assistance Where Assessed-Toileting: Glass blower/designer: Psychiatric nurse Method: Counselling psychologist: Energy manager: Environmental education officer Method: Heritage manager: Grab bars, Nurse, learning disability    Praxis   Exercises:   Other Treatments:     Therapy/Group: Individual Therapy  Tonny Branch 02/09/2020, 7:16 AM

## 2020-02-09 NOTE — Progress Notes (Signed)
Physical Therapy Session Note  Patient Details  Name: Cristina Conner MRN: [id number] Date of Birth: 27-Jan-1961  Today's Date: 02/09/2020 PT Individual Time: 0915-1001 PT Individual Time Calculation (min): 46 min   Short Term Goals: Week 1:  PT Short Term Goal 1 (Week 1): STGs=LTGs  Skilled Therapeutic Interventions/Progress Updates: Pt presented in recliner agreeable to therapy. Pt denies pain during session. Per discussion with Colletta Maryland OT, PTA discussed use of rollator for energy conservation, pt was agreeable to trial during session. PTA obtained rollator provided education on use and breaks. Pt ambulated throughout unit with rollator CGA overall with varying distances of 200-341f. Pt noted to have slight difficulty maintaining straight trajectory with RW and occasional decreased L foot clearance however demonstrated overall fair safety with use of RW and was appropriately able to use. Pt also participated in obstacle negotiation with rollator via obstacle course. Pt not 100% sure if prefers rollator vs RW upon d/c. Adv to continue to trial rollator and can make decision tomorrow with primary PT/OT with pt verbalizing understanding. Pt then ambulated to treadmill and participated in gait at treadmill for work on stride length and speed in controlled setting. Pt ambulated x 3 min at .3 progressing to .7 mph for a total of 152 ft. After seated rest performed an additional 1:30 at .8 mph for additional 1175f Pt required intermittent cues for increased L step length and heel strike but no significant "catching". After seated rest pt ambulated bouts of 150-20050fithout AD with improve step length, cadence, and gait speed. Pt also noted that she felt more comfortable with increasing speed after treadmill activity. PTA incorporated several quick and pivot turns during gait training with pt able to perform without LOB. Pt ambulated back to room at end of session without AD and CGA and returned to recliner.  Pt left with seat alarm on, call bell within reach and needs met.      Therapy Documentation Precautions:  Precautions Precautions: Fall Precaution Comments: left leg weakness, imbalance, double vision (has corrective glasses) Restrictions Weight Bearing Restrictions: No General:   Vital Signs: Therapy Vitals Temp: 98 F (36.7 C) Pulse Rate: 80 Resp: 17 BP: 130/74 Patient Position (if appropriate): Lying Oxygen Therapy SpO2: 100 % O2 Device: Room Air   Therapy/Group: Individual Therapy  Monserrat Vidaurri  Cherylene Ferrufino, PTA  02/09/2020, 4:18 PM

## 2020-02-10 ENCOUNTER — Inpatient Hospital Stay (HOSPITAL_COMMUNITY): Payer: BC Managed Care – PPO | Admitting: Speech Pathology

## 2020-02-10 ENCOUNTER — Inpatient Hospital Stay (HOSPITAL_COMMUNITY): Payer: BC Managed Care – PPO

## 2020-02-10 ENCOUNTER — Inpatient Hospital Stay (HOSPITAL_COMMUNITY): Payer: BC Managed Care – PPO | Admitting: Physical Therapy

## 2020-02-10 ENCOUNTER — Inpatient Hospital Stay (HOSPITAL_COMMUNITY): Payer: BC Managed Care – PPO | Admitting: Occupational Therapy

## 2020-02-10 NOTE — Progress Notes (Addendum)
[facility] PHYSICAL MEDICINE & REHABILITATION PROGRESS NOTE   Subjective/Complaints: Overall doing fairly well. Asked if sutures/staples can come out. Slept well last night  ROS: Patient denies fever, rash, sore throat, blurred vision, nausea, vomiting, diarrhea, cough, shortness of breath or chest pain, joint or back pain, headache, or mood change.    Objective:   No results found. No results for input(s): WBC, HGB, HCT, PLT in the last 72 hours. No results for input(s): NA, K, CL, CO2, GLUCOSE, BUN, CREATININE, CALCIUM in the last 72 hours.  Intake/Output Summary (Last 24 hours) at 02/10/2020 1127 Last data filed at 02/10/2020 0831 Gross per 24 hour  Intake 730 ml  Output --  Net 730 ml     Physical Exam: Vital Signs Blood pressure 133/82, pulse 94, temperature 98.1 F (36.7 C), temperature source Oral, resp. rate 18, height 5\' 6"  (1.676 m), weight 52.9 kg, last menstrual period 05/01/2009, SpO2 100 %. Constitutional: No distress . Vital signs reviewed. HEENT: OS sclera still somewhat irriated Neck: supple Cardiovascular: RRR without murmur. No JVD    Respiratory/Chest: CTA Bilaterally without wheezes or rales. Normal effort    GI/Abdomen: BS +, non-tender, non-distended Ext: no clubbing, cyanosis, or edema Psych: pleasant and cooperative Skin: scalp wounds intact.  Musc: No edema in extremities.  No tenderness in extremities. Neuro: Alert Left facial weakness, can't close left eye but able to move lid toward closing, VI nerve weakness--no change Motor: Grossly 4+/5 throughout--stable  Assessment/Plan: 1. 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  Physiatrist is providing close team supervision and 24 hour management of active medical problems listed below.  Physiatrist and rehab team continue to assess barriers to discharge/monitor patient progress  toward functional and medical goals  Care Tool:  Bathing    Body parts bathed by patient: Right arm, Left arm, Chest, Abdomen, Front perineal area, Buttocks, Right upper leg, Left upper leg, Right lower leg, Left lower leg, Face         Bathing assist Assist Level: Contact Guard/Touching assist     Upper Body Dressing/Undressing Upper body dressing   What is the patient wearing?: Pull over shirt, Bra    Upper body assist Assist Level: Supervision/Verbal cueing    Lower Body Dressing/Undressing Lower body dressing      What is the patient wearing?: Pants     Lower body assist Assist for lower body dressing: Contact Guard/Touching assist     Toileting Toileting    Toileting assist Assist for toileting: Supervision/Verbal cueing     Transfers Chair/bed transfer  Transfers assist     Chair/bed transfer assist level: Supervision/Verbal cueing Chair/bed transfer assistive device: Armrests   Locomotion Ambulation   Ambulation assist      Assist level: Contact Guard/Touching assist Assistive device: No Device Max distance: 250'   Walk 10 feet activity   Assist     Assist level: Contact Guard/Touching assist Assistive device: No Device   Walk 50 feet activity   Assist Walk 50 feet with 2 turns activity did not occur: Safety/medical concerns  Assist level: Contact Guard/Touching assist Assistive device: No Device    Walk 150 feet activity   Assist Walk 150 feet activity did not occur: Safety/medical concerns  Assist level: Contact Guard/Touching assist Assistive device: No Device    Walk 10 feet on uneven surface  activity   Assist Walk 10 feet on uneven surfaces activity did not  occur: Safety/medical concerns         Wheelchair     Assist Will patient use wheelchair at discharge?: No             Wheelchair 50 feet with 2 turns activity    Assist            Wheelchair 150 feet activity     Assist           Blood pressure 133/82, pulse 94, temperature 98.1 F (36.7 C), temperature source Oral, resp. rate 18, height 5\' 6"  (1.676 m), weight 52.9 kg, last menstrual period 05/01/2009, SpO2 100 %.  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  Antithrombotics: -DVT/anticoagulation:  she is ambulatory              -antiplatelet therapy: N/a 3. Headaches/Pain Management: Worse at nights--continue hydrocodone prn.  Continue tylenol qid--decreased to 650 mg.   Controlled on 6/10.  4. Mood: LCSW to follow for evaluation and support.  -ego support by team. Seems to be in good spirits at present  -on prozac and buspar for anxiety/depression. (off remeron currently)              -antipsychotic agents: N/A 5. Neuropsych: This patient is capable of making decisions on her own behalf. 6. Skin/Wound Care: Monitor for healing. Routine pressure relief measures.   -remove sutures/staples today 7. Fluids/Electrolytes/Nutrition: Monitor I/O.   -Ensure PRN 8. Left VII nerve injury: Continue eye drops 5 X day and patch at night/when asleep.  Keep eye covered,lubricated as much as possible. Could just tape eye lid also 9. Resting tachycardia: Continue metoprolol and montinue for now. Multifactorial due to surgery, debility, anemia.   Overall controlled 6/10 10. ABLA: Iron dextran 5/23--continue iron supplement.  Hemoglobin 9.2 6/7  Continue to monitor 11. HTN:   Controlled on 6/10 13. Left hip pain s/p THR 12/20:   Hip x-ray showing left hip arthroplasty  Controlled on 6/10 14. Small bowel RSXN: Continue Bentyl. Has been having BMs- stopped miralax (has been refusing) as colace bid effective.  15. Vestibular dysfunction: N/V have resolved.        LOS: 7 days A FACE TO FACE EVALUATION WAS PERFORMED  Meredith Staggers 02/10/2020, 11:27 AM

## 2020-02-10 NOTE — Progress Notes (Signed)
Physical Therapy Session Note  Patient Details  Name: Cristina Conner MRN: [id number] Date of Birth: 06-05-1961  Today's Date: 02/10/2020 PT Individual Time: 1450-1530 PT Individual Time Calculation (min): 40 min   Short Term Goals: Week 1:  PT Short Term Goal 1 (Week 1): STGs=LTGs  Skilled Therapeutic Interventions/Progress Updates:    Patient received in bed follow suture/staple removal by nursing (20 minutes of therapy time lost due to this); generally able to perform all functional transfers with S and no device including toilet transfers and pericare in bathroom, as well as brushing teeth at sink with S for safety. Gait trained approximately 143f with no device and min guard with cues for improved gait mechanics including improved heel strike and ankle dorsiflexion, then focused session on balance training including multidirectional cone taps on and off of foam pad, tandem gait backwards and forwards, and working on reaction to external perturbations on blue foam pad with eyes closed. Overall needed Min-ModA to maintain balance during all functional tasks today. Left in bed with all needs met, bed alarm active this afternoon.   Therapy Documentation Precautions:  Precautions Precautions: Fall Precaution Comments: left leg weakness, imbalance, double vision (has corrective glasses) Restrictions Weight Bearing Restrictions: No General: PT Amount of Missed Time (min): 20 Minutes PT Missed Treatment Reason: Nursing care;Wound care Pain: Pain Assessment Pain Scale: 0-10 Pain Score: 4  Pain Type: Acute pain Pain Location: Head Pain Orientation: Left;Posterior Pain Descriptors / Indicators: Aching;Sore Pain Frequency: Intermittent Pain Onset: On-going Patients Stated Pain Goal: 0 Pain Intervention(s): RN made aware Multiple Pain Sites: No    Therapy/Group: Individual Therapy   KWindell Norfolk DPT, PN1   Supplemental Physical Therapist [place]   Pager  [phone] Rehab Office [phone]   02/10/2020, 3:39 PM

## 2020-02-10 NOTE — Progress Notes (Signed)
Occupational Therapy Session Note  Patient Details  Name: Cristina Conner MRN: [id number] Date of Birth: October 27, 1960  Today's Date: 02/10/2020 OT Individual Time: 2751-7001 OT Individual Time Calculation (min): 58 min    Short Term Goals: Week 1:  OT Short Term Goal 1 (Week 1): STG=LTG d.t ELOS  Skilled Therapeutic Interventions/Progress Updates:    1;1. Pt received in bed agreeable to OT reporting need for toileting. Pt completes all transfers throughout session with Rolator which pt self selected d/t feeling unsteady in the morning. Pt able to transfer to toilet with S and VC for brake management and problem solve when she dropped item on the floor to turn and sit on Rolator with it locked to pick item up off floor as alternative method to bending in stanidng. Pt bathes at shower level with set up and dresses sit to stand level at [facility] in bathroom with set up. Pt grooms with set up in standing and no VC required.  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 Pt able to access HEP via smart phone. Pt homework is to attempt to follow along with yellow soft putty and report back.   Access Code: [email or web address] URL: [email or web address] Date: 02/10/2020 Prepared by: Mariane Masters  Exercises Tip Pinch with Putty - 1 x daily - 7 x weekly - 10 reps - 3 sets Thumb Opposition with Putty - 1 x daily - 7 x weekly - 10 reps - 3 sets Finger Exension with Putty - 1 x daily - 7 x weekly - 10 reps - 3 sets Key Pinch with Putty - 1 x daily - 7 x weekly - 10 reps - 3 sets Finger Pinch and Pull with Putty - 1 x daily - 7 x weekly - 10 reps - 3 sets Putty Squeezes - 1 x daily - 7 x weekly - 10 reps - 3 sets  Exited session with pt seated in bed, exit alarm on and call light itn reach.   Therapy Documentation Precautions:  Precautions Precautions: Fall Precaution Comments: left leg weakness, imbalance, double vision (has corrective  glasses) Restrictions Weight Bearing Restrictions: No General:   Vital Signs: Therapy Vitals Temp: 98.1 F (36.7 C) Temp Source: Oral Pulse Rate: 94 Resp: 18 BP: 133/82 Patient Position (if appropriate): Lying Oxygen Therapy SpO2: 100 % O2 Device: Room Air Pain: Pain Assessment Pain Score: 0-No pain ADL: ADL Eating: Supervision/safety Grooming: Minimal assistance Where Assessed-Grooming: Standing at sink Upper Body Bathing: Supervision/safety Where Assessed-Upper Body Bathing: Shower Lower Body Bathing: Minimal assistance Where Assessed-Lower Body Bathing: Shower Upper Body Dressing: Supervision/safety Where Assessed-Upper Body Dressing: Sitting at sink Lower Body Dressing: Minimal assistance Where Assessed-Lower Body Dressing: Sitting at sink, Standing at sink Toileting: Minimal assistance Where Assessed-Toileting: Glass blower/designer: Psychiatric nurse Method: Counselling psychologist: Energy manager: Environmental education officer Method: Heritage manager: Grab bars, Nurse, learning disability    Praxis   Exercises:   Other Treatments:     Therapy/Group: Individual Therapy  Tonny Branch 02/10/2020, 7:01 AM

## 2020-02-10 NOTE — Progress Notes (Signed)
Patient up to BR with assistance x1 of staff, states she is proud of the progress she is making and is excited to continue the program. Cooperative and in good spirits, left eye covered with gauze upon request, left eye and eye lid,continue to have difficult in closing completely,light sensitivity remains.,  Made as comfortable as possible, provided po liquids, readjusted HOB,call bell placed within reach, bed alarm on, monitor,without discomfort or distress at this time

## 2020-02-10 NOTE — Progress Notes (Signed)
Speech Language Pathology Daily Session Note  Patient Details  Name: MILANIE ROSENFIELD MRN: [id number] Date of Birth: 1961/08/11  Today's Date: 02/10/2020 SLP Individual Time: 0830-0930 SLP Individual Time Calculation (min): 60 min  Short Term Goals: Week 1: SLP Short Term Goal 1 (Week 1): Patient will consume trials of regular textures with efficient mastication and complete oral clearance X 2 sessions without overt s/s of aspiration and supervision level verbal cues for use of compensatory strateiges prior to upgrade. SLP Short Term Goal 2 (Week 1): Patient will utilize speech intelligibility strategies to achieve 100% intelligibility at the conversation level with Mod I. SLP Short Term Goal 3 (Week 1): Patient will demonstrate complex problem solving for functional tasks with supervision verbal cues. SLP Short Term Goal 4 (Week 1): Patient will recall new, functional information with supervision verbal and visual cues.  Skilled Therapeutic Interventions: Skilled treatment session focused on cognitive goals. SLP facilitated session by providing education regarding patient's current swallowing function, diet recommendations and appropriate textures. SLP also provided education regarding memory compensatory strategies and strategies to utilize at home to maximize recall and overall independence. She verbalized understanding of all information and participated in how to incorporate strategies at home with Mod I. Patient also began administering the CLQT and will finish during next session. Patient left upright in the recliner with alarm on and all needs within reach. Continue with current plan of care.      Pain No/Denies Pain   Therapy/Group: Individual Therapy  Nefertiti Mohamad 02/10/2020, 12:33 PM

## 2020-02-10 NOTE — Progress Notes (Signed)
Occupational Therapy Session Note  Patient Details  Name: Cristina Conner MRN: [id number] Date of Birth: [birth year]  Today's Date: 02/10/2020 OT Individual Time: 1130-1200 OT Individual Time Calculation (min): 30 min    Short Term Goals: Week 1:  OT Short Term Goal 1 (Week 1): STG=LTG d.t ELOS  Skilled Therapeutic Interventions/Progress Updates:    Patient seated in recliner, denies pain.  She states that she would like to work on her balance.  Sit to stand from recliner and ambulation in room CGA/CS without AD - she is able to reach into cabinets with CS.  Ambulation on unit and to/from therapy gym with CG/CS.  Completed standing dynamic balance / hip and knee AROM activities with support of table top.  Completed hand / UB coordination activities (trials with both right and left - note similar timing on both sides).  Returned to recliner at close of session, seat alarm set and call bell/tray table in front in prep for lunch  Therapy Documentation Precautions:  Precautions Precautions: Fall Precaution Comments: left leg weakness, imbalance, double vision (has corrective glasses) Restrictions Weight Bearing Restrictions: No  Therapy/Group: Individual Therapy  Carlos Levering 02/10/2020, 7:46 AM

## 2020-02-11 ENCOUNTER — Inpatient Hospital Stay (HOSPITAL_COMMUNITY): Payer: BC Managed Care – PPO

## 2020-02-11 ENCOUNTER — Inpatient Hospital Stay (HOSPITAL_COMMUNITY): Payer: BC Managed Care – PPO | Admitting: Speech Pathology

## 2020-02-11 ENCOUNTER — Inpatient Hospital Stay (HOSPITAL_COMMUNITY): Payer: BC Managed Care – PPO | Admitting: *Deleted

## 2020-02-11 MED ORDER — METOPROLOL TARTRATE 25 MG PO TABS: 25.0000 mg | ORAL_TABLET | Freq: Two times a day (BID) | ORAL | 0 refills | Status: DC

## 2020-02-11 MED ORDER — TRAZODONE HCL 50 MG PO TABS: 25.0000 mg | ORAL_TABLET | Freq: Every evening | ORAL | 0 refills | Status: DC | PRN

## 2020-02-11 MED ORDER — ARTIFICIAL TEARS OPHTHALMIC OINT
TOPICAL_OINTMENT | Freq: Every day | OPHTHALMIC | 1 refills | Status: DC
Start: 2020-02-11 — End: 2020-11-22

## 2020-02-11 MED ORDER — TRAMADOL HCL 50 MG PO TABS: 50.0000 mg | ORAL_TABLET | Freq: Two times a day (BID) | ORAL | 0 refills | Status: DC | PRN

## 2020-02-11 MED ORDER — DOCUSATE SODIUM 100 MG PO CAPS: 100.0000 mg | ORAL_CAPSULE | Freq: Two times a day (BID) | ORAL | 0 refills | Status: DC

## 2020-02-11 MED ORDER — ARTIFICIAL TEARS OPHTHALMIC OINT
TOPICAL_OINTMENT | Freq: Every day | OPHTHALMIC | Status: DC
  Filled 2020-02-11: qty 3.5

## 2020-02-11 NOTE — Evaluation (Signed)
Recreational Therapy Assessment and Plan  Patient Details  Name: Cristina Conner MRN: [id number] Date of Birth: 01-20-1961 Today's Date: 02/11/2020  Rehab Potential:   Good ELOS:   d/c 6/12 Assessment  Problem List:      Patient Active Problem List   Diagnosis Date Noted  . 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[place]) 02/03/2020  . Protein-calorie malnutrition, severe 01/30/2020  . Brain tumor ([place]) 01/27/2020  . Low folate 01/23/2020  . Anemia of chronic disease 01/23/2020  . Small bowel obstruction from retained endoscopy capsule at stricture s/p ileal resection 01/19/2020 01/18/2020  . 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 Primary localized osteoarthritis of left hip 05-Sep-2019  . Death of family member-  husband passed Jul 2020 04/29/2019  . 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 Sleep difficulties 09/09/2018  . Pre-diabetes 03/09/2018  . Postmenopausal state- went thru early 40's 01/02/2018  . Primary osteoarthritis of left hip 01/02/2018  . Caregiver burden- for husband with ALLeukemia 01/02/2018  . Left hip pain 01/02/2018  . Osteopenia 07/31/2016  . Hypertension 05/20/2011  . Menopausal state 05/20/2011    Past Medical History:      Past Medical History:  Diagnosis Date  . Anxiety   . Arthritis   . Diverticulosis   . GERD (gastroesophageal reflux disease)   . Hypertension   . Osteopenia   . Post-operative nausea and vomiting    vomiting after general anesthesia per pt.  . Pre-diabetes    Past Surgical History:       Past Surgical History:  Procedure Laterality Date  . CESAREAN SECTION     x 2  . COLONOSCOPY    . CRANIOTOMY Left 01/27/2020   Procedure: Left Craniotomy for Tumor Resection;  Surgeon: Judith Part, MD;  Location: [facility];  Service: Neurosurgery;  Laterality: Left;  . ENDOMETRIAL ABLATION    . ESOPHAGOGASTRODUODENOSCOPY  10/2019  . LAPAROSCOPY N/A 01/19/2020   Procedure:  LAPAROSCOPY DIAGNOSTIC LAPAROTOMY WITH SMALL BOWEL RESECTION;  Surgeon: Ileana Roup, MD;  Location: [facility];  Service: General;  Laterality: N/A;  . PELVIC LAPAROSCOPY  1999   left salpingectomy, excision of Right, paratubal cyst  . PELVIC LAPAROSCOPY     laparoscopy with lysis of pelvic adhesions  . SHOULDER SURGERY  11/2010   "FROZEN SHOULDER"  . TOTAL HIP ARTHROPLASTY Left September 05, 2019   Procedure: LEFT TOTAL HIP ARTHROPLASTY ANTERIOR APPROACH;  Surgeon: Melrose Nakayama, MD;  Location: [facility];  Service: Orthopedics;  Laterality: Left;  . TUBAL LIGATION    . UPPER GASTROINTESTINAL ENDOSCOPY    . VENTRICULOSTOMY Left 01/27/2020   Procedure: VENTRICULOSTOMY;  Surgeon: Judith Part, MD;  Location: [facility];  Service: Neurosurgery;  Laterality: Left;    Assessment & Plan Clinical Impression:Cristina Conner is a 59 year old female with history of L-THR, anxiety d/o, N/V with small bowel inflammatory changes and recent capsule endoscopy with failure of capsule to pass thorough.Follow-up CT abdomen showed resolution of inflammatory changes but patient continued to have intermittent nausea and vomiting with constipation, weakness and partial small bowel obstruction. Due to concerns of stricture or adhesive disease, she was admitted on 01/18/2020 for hydration and underwent laparoscopy with labs SB resection and capsule removal on 05/18 by Dr. Dema Severin. Postop has had issues with tachycardia as well as elevated BP. She was found to have a wide-based festinating type gait used intermittently with a tendency to use balance during physical therapy. CT of head done revealing moderate  hydrocephalus with mass-effect on left side of fourth ventricle with displacement to the right and possible mass lesion left posterior fossa displacing brachium pontis.  Dr. Trenton Gammon was consulted for input and she was transferred to [facility] for further work-up. Patient did report having  progressive headaches for 2 months with some decrease in hearing on the left. 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 She underwent left rectosigmoid craniotomy for resection of CP mass with placement of left occipital ventriculostomy by Dr. Venetia Constable on 05/27. EVD removed on 05/31 as was no longer working and follow-up CT head showed improvement in hydrocephalus. Nausea/vomiting has resolvedwith improvement in intake. ABLA being monitored. She continues to have issues with headaches, dizziness, diplopiaas well as left foot weakness with unsteady gait. STM deficits reported with problems processing as well as mild dysphagia. CIR was recommended due to functional decline. Patient transferred to CIR on 02/03/2020 .   Pt presents with decreased activity tolerance, decreased functional mobility, decreased balance, decreased coordination Limiting pt's independence with leisure/community pursuits.  Met with pt today to discuss leisure interest, overall health and wellness including physical, emotional, social and spiritual needs, stress management/relaxation techniques.  Pt appreciative of information and is looking forward to discharge home with daughter tomorrow.  Plan    No further TR as pt is discharging tomorrow.  Recommendations for other services: None   Discharge Criteria: Patient will be discharged from TR if patient refuses treatment 3 consecutive times without medical reason.  If treatment goals not met, if there is a change in medical status, if patient makes no progress towards goals or if patient is discharged from hospital.  The above assessment, treatment plan, treatment alternatives and goals were discussed and mutually agreed upon: by patient  [place] 02/11/2020, 12:50 PM

## 2020-02-11 NOTE — Discharge Summary (Signed)
Physician Discharge Summary  Patient ID: Cristina Conner MRN: [id number] DOB/AGE: 08-Aug-1961 59 y.o.  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 The left hip arthroplasty is in stable position with no evidence of complication. There are no acute displaced fractures. Right hip is unremarkable. Soft tissues are normal. IMPRESSION: 1. Unremarkable left hip arthroplasty. 2. No acute bony abnormality. Electronically Signed   By: Randa Ngo M.D.   On: 02/03/2020 19:17    Labs:  Basic Metabolic Panel: BMP Latest Ref Rng & Units 02/07/2020 02/04/2020 01/27/2020  Glucose 70 - 99 mg/dL 108(H) 107(H) -  BUN 6 - 20 mg/dL 11 11 -  Creatinine 0.44 - 1.00 mg/dL 0.75 0.81 0.78  BUN/Creat Ratio 9 - 23 - - -  Sodium 135 - 145 mmol/L 138 138 -  Potassium 3.5 - 5.1 mmol/L 3.8 4.1 -  Chloride 98 - 111 mmol/L 105 105 -  CO2 22 - 32 mmol/L 24 24 -  Calcium 8.9 - 10.3 mg/dL 9.4 9.0 -    CBC: CBC Latest Ref Rng & Units 02/07/2020 02/04/2020 01/29/2020  WBC 4.0 - 10.5 K/uL 7.9 10.1 9.7  Hemoglobin 12.0 - 15.0 g/dL 9.2(L) 8.2(L) 7.7(L)  Hematocrit 36 - 46 % 30.9(L) 26.6(L) 24.9(L)  Platelets 150 - 400 K/uL 594(H) 516(H) 309    CBG: No results for input(s): GLUCAP in the last 168 hours.  Brief HPI:   Cristina Conner is a 59 y.o. female with history of L-THR, anxiety disorder, N/V with small bowel inflammatory changes and  recent capsule endoscopy with failure To Pass through.  Follow-Up CT Abdomen showed resolution of inflammatory changes but patient continued to have symptoms with concerns of stricture of adhesive disease.  She was admitted on 01/18/2020 for hydration and underwent laparoscopy with small bowel resection and capsule removal by Dr. Dema Severin.  Postop she has had issues with tachycardia as well as elevated blood pressure.  She was found to have wide based festinating gait used intermittently with tendency to loose balance. CT head done revealing moderate hydrocephalus with mass effect on left 4 th ventricle and displacement to right and possible mass lesion left posterior fossa.   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  She underwent left retrosigmoid craniotomy for resection of CP mass and placement of left occipital ventriculostomy by Dr. Venetia Constable on 05/27.  EVD was removed on 05/31 as it was no longer working and follow-up CT head showed improvement in hydrocephalus.  Nausea/vomiting had resolved but she continued to have issues with headaches, dizziness, diplopia as well as left foot weakness with unsteady gait.  STM deficits reported with problems processing as well as mild dysphagia.  CIR was recommended due to functional deficits.   Hospital Course: Cristina Conner was admitted to rehab 02/03/2020 for inpatient therapies to consist of PT, ST and OT at least three  hours five days a week. Past admission physiatrist, therapy team and rehab RN have worked together to provide customized collaborative inpatient rehab. Po intake has been good. She is continent of bowel and bladder. She reported increase in left hip pain since surgery and X rays done revealing stable arthroplasty. Blood pressures were monitored on TID basis and and have been controlled.  Follow up CBC showed that ABLA  is resolving. Check of lytes is WNL.  Mood and anxiety have been stable.  Heart rate has been controlled on low dose BB. Cranial incision has been healing well and staples were removed on 06/10.  Headaches have been controlled with use of tylenol or ultram on prn basis.  Eye drops were used every 2 hours and left eye was patched at nights. Left eye irritation noted prior to discharge and she was advised to tape left eye as much as possible till irritation improved.  She has made progress during her stay and is currently at supervision level. She will continue to receive follow up outpatient PT, OT and ST after discharge.    Rehab course: During patient's stay in rehab team conference was held to monitor patient's progress, set goals and discuss barriers to discharge. At admission, patient required mod assist with mobility and min assist with basic ADLs.  She exhibited mild impairments in visual construction and high-level problem-solving as well as recall.  Mild dysarthria noted with decrease in speech intelligibility at conversational level. She  has had improvement in activity tolerance, balance, postural control as well as ability tompensate for deficits.  She is able to complete ADL tasks with supervision and set up assist.  She is modified independent for transfers and to ambulate 120' X 2 with RW.  She is tolerating dysphagia 3 diet and is able to utilize swallow is strategies at modified independent level.  She is able to complete functional and mildly complex tasks at modified independent to supervision level.  Family education was completed regarding all aspects of safety and mobility.   Disposition: Home  Diet: Soft foods.   Special Instructions: 1. Keep right eye occluded as much as possible. Administer eye drops every 2 hours while awake. 2. No driving or strenuous activity.   Discharge Instructions    Ambulatory referral to Occupational Therapy   Complete by: As directed    Eval and  treat   Ambulatory referral to Physical Medicine Rehab   Complete by: As directed    1-2 weeks TC appointment   Ambulatory referral to Physical Therapy   Complete by: As directed    Eval and treat   Iontophoresis - 4 mg/ml of dexamethasone: No   T.E.N.S. Unit Evaluation and Dispense as Indicated: No   Ambulatory referral to Speech Therapy   Complete by: As directed    Eval and treat     Allergies as of 02/11/2020      Reactions   Hydrocodone Nausea And Vomiting      Medication List    TAKE these medications   acetaminophen 500 MG tablet Commonly known as: TYLENOL Take 1-2 tablets (500-1,000 mg total) by mouth every 4 (four) hours as needed for moderate pain. What changed:   how much to take  when to take this   busPIRone 5 MG tablet Commonly known as: BUSPAR Take 1 tablet (5 mg total) by mouth 3 (three) times daily.   cyanocobalamin 1000 MCG tablet Take 1 tablet (1,000 mcg total) by mouth daily.   dicyclomine 10 MG capsule  Commonly known as: BENTYL Take 1 capsule (10 mg total) by mouth 3 (three) times daily before meals.   docusate sodium 100 MG capsule Commonly known as: COLACE Take 1 capsule (100 mg total) by mouth 2 (two) times daily.   eszopiclone 1 MG Tabs tablet Commonly known as: LUNESTA Take 1 mg by mouth at bedtime as needed for sleep.   famotidine 40 MG tablet Commonly known as: PEPCID Take 40 mg by mouth daily.   FLUoxetine 10 MG tablet Commonly known as: PROZAC Take 1 tablet (10 mg total) by mouth daily.   folic acid 1 MG tablet Commonly known as: FOLVITE Take 1 tablet (1 mg total) by mouth daily.   hydroxypropyl methylcellulose / hypromellose 2.5 % ophthalmic solution Commonly known as: ISOPTO TEARS / GONIOVISC Place 1 drop into the left eye every 2 (two) hours.   Integra 62.5-62.5-40-3 MG Caps Take 62.5 mg by mouth daily.   iron polysaccharides 150 MG capsule Commonly known as: NIFEREX Take 1 capsule (150 mg total) by mouth daily.    metoprolol tartrate 25 MG tablet Commonly known as: LOPRESSOR Take 1 tablet (25 mg total) by mouth 2 (two) times daily.   multivitamin with minerals Tabs tablet Take 1 tablet by mouth daily.   ondansetron 4 MG tablet Commonly known as: ZOFRAN Take 1 tablet (4 mg total) by mouth every 6 (six) hours as needed for nausea or vomiting.   ondansetron 8 MG disintegrating tablet Commonly known as: ZOFRAN-ODT Take 1 tablet (8 mg total) by mouth every 8 (eight) hours as needed for nausea or refractory nausea / vomiting.   polyvinyl alcohol 1.4 % ophthalmic solution Commonly known as: LIQUIFILM TEARS Place 1 drop into the left eye every 2 (two) hours while awake.   traMADol 50 MG tablet--Rx  #10 pills Commonly known as: ULTRAM Take 1 tablet (50 mg total) by mouth 2 (two) times daily as needed. What changed:   when to take this  reasons to take this   traZODone 50 MG tablet Commonly known as: DESYREL Take 0.5-1 tablets (25-50 mg total) by mouth at bedtime as needed for sleep.        Signed: Bary Leriche 02/11/2020, 5:44 PM

## 2020-02-11 NOTE — Progress Notes (Signed)
[facility] PHYSICAL MEDICINE & REHABILITATION PROGRESS NOTE   Subjective/Complaints: Up in chair. Asked if she could get in shower. Excited about getting home!  ROS: Patient denies fever, rash, sore throat, nausea, vomiting, diarrhea, cough, shortness of breath or chest pain, joint or back pain, headache, or mood change.     Objective:   No results found. No results for input(s): WBC, HGB, HCT, PLT in the last 72 hours. No results for input(s): NA, K, CL, CO2, GLUCOSE, BUN, CREATININE, CALCIUM in the last 72 hours.  Intake/Output Summary (Last 24 hours) at 02/11/2020 1046 Last data filed at 02/11/2020 0700 Gross per 24 hour  Intake 1000 ml  Output --  Net 1000 ml     Physical Exam: Vital Signs Blood pressure 117/76, pulse 80, temperature 99 F (37.2 C), temperature source Oral, resp. rate 16, height 5\' 6"  (1.676 m), weight 53.6 kg, last menstrual period 05/01/2009, SpO2 100 %. Constitutional: No distress . Vital signs reviewed. HEENT: EOMI, left sclera red/irritated Neck: supple Cardiovascular: RRR without murmur. No JVD    Respiratory/Chest: CTA Bilaterally without wheezes or rales. Normal effort    GI/Abdomen: BS +, non-tender, non-distended Ext: no clubbing, cyanosis, or edema Psych: pleasant and cooperative Skin: scalp wounds intact.  Musc: No edema in extremities.  No tenderness in extremities. Neuro: Alert Left facial, brow weakness, still can't close left eye but able to move lid toward closing, VI nerve weakness appears resolved. Motor: Grossly 4+/5 throughout--no changes  Assessment/Plan: 1. 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  Physiatrist is providing close team supervision and 24 hour management of active medical problems listed below.  Physiatrist and rehab team continue to assess barriers to discharge/monitor patient progress toward  functional and medical goals  Care Tool:  Bathing    Body parts bathed by patient: Right arm, Left arm, Chest, Abdomen, Front perineal area, Buttocks, Right upper leg, Left upper leg, Right lower leg, Left lower leg, Face         Bathing assist Assist Level: Contact Guard/Touching assist     Upper Body Dressing/Undressing Upper body dressing   What is the patient wearing?: Pull over shirt, Bra    Upper body assist Assist Level: Supervision/Verbal cueing    Lower Body Dressing/Undressing Lower body dressing      What is the patient wearing?: Pants     Lower body assist Assist for lower body dressing: Contact Guard/Touching assist     Toileting Toileting    Toileting assist Assist for toileting: Supervision/Verbal cueing     Transfers Chair/bed transfer  Transfers assist     Chair/bed transfer assist level: Supervision/Verbal cueing Chair/bed transfer assistive device: Armrests   Locomotion Ambulation   Ambulation assist      Assist level: Contact Guard/Touching assist Assistive device: No Device Max distance: 147ft   Walk 10 feet activity   Assist     Assist level: Contact Guard/Touching assist Assistive device: No Device   Walk 50 feet activity   Assist Walk 50 feet with 2 turns activity did not occur: Safety/medical concerns  Assist level: Contact Guard/Touching assist Assistive device: No Device    Walk 150 feet activity   Assist Walk 150 feet activity did not occur: Safety/medical concerns  Assist level: Contact Guard/Touching assist Assistive device: No Device    Walk 10 feet on uneven surface  activity   Assist Walk 10 feet on uneven surfaces activity  did not occur: Safety/medical concerns         Wheelchair     Assist Will patient use wheelchair at discharge?: No             Wheelchair 50 feet with 2 turns activity    Assist            Wheelchair 150 feet activity     Assist           Blood pressure 117/76, pulse 80, temperature 99 F (37.2 C), temperature source Oral, resp. rate 16, height 5\' 6"  (1.676 m), weight 53.6 kg, last menstrual period 05/01/2009, SpO2 100 %.  Medical Problem List and Plan: 1.Impaired function due to vestibular schwannoma with impaired balance, gait and L hearing loss and dysphagia, and mild/moderate cognitive issues  Continue CIR PT, OT, SLP---family ed, dc 6/12  -Patient to see me in the office for transitional care encounter in 1-2 weeks.  2.  Antithrombotics: -DVT/anticoagulation:  she is ambulatory              -antiplatelet therapy: N/a 3. Headaches/Pain Management: Worse at nights--continue hydrocodone prn.  Continue tylenol qid--decreased to 650 mg.   improved on 6/11.  4. Mood: LCSW to follow for evaluation and support.  -ego support by team. Seems to be in good spirits at present  -on prozac and buspar for anxiety/depression. (off remeron currently)              -antipsychotic agents: N/A 5. Neuropsych: This patient is capable of making decisions on her own behalf. 6. Skin/Wound Care: Monitor for healing. Routine pressure relief measures.   -removed sutures/staples 6/10---are looks great  -she can shower 7. Fluids/Electrolytes/Nutrition: Monitor I/O.   -Ensure PRN 8. Left VII nerve injury: Continue eye drops 5 X day and patch at night/when asleep.  -reviewed methods to protect left eye from irritation 9. Resting tachycardia: Continue metoprolol and montinue for now. Multifactorial due to surgery, debility, anemia.   Overall controlled 6/11 10. ABLA: Iron dextran 5/23--continue iron supplement.  Hemoglobin 9.2 6/7 (increased)  Continue to monitor 11. HTN:   Controlled on 6/11 13. Left hip pain s/p THR 12/20:   Hip x-ray showing left hip arthroplasty  Controlled on 6/11 14. Small bowel RSXN: Continue Bentyl. Has been having BMs- stopped miralax (has been refusing) as colace bid effective.  15. Vestibular dysfunction: N/V  have resolved.        LOS: 8 days A FACE TO FACE EVALUATION WAS PERFORMED  Meredith Staggers 02/11/2020, 10:46 AM

## 2020-02-11 NOTE — Progress Notes (Addendum)
Patient ID: Cristina Conner, female   DOB: 1961/07/08, 59 y.o.   MRN: [id number]  SW met with pt in room to provide [facility] Neuro Rehab directions, and inform on DME ordered: Shower chair. Pt informed item will come from [employer].   SW left message for [facility] [phone]) about scheduling a follow-up appointment with Dr. Apolonio Schneiders. SW waiting on follow-up.  *SW received return phone call from [place] with [facility] to schedule appointment on Monday (6/14) at 9:30am (virtual appointment). SW informed pt on above.   Loralee Pacas, MSW, [place] Office: [phone] Cell: [phone] Fax: [phone]

## 2020-02-11 NOTE — Progress Notes (Addendum)
Physical Therapy Discharge Summary  Patient Details  Name: Cristina Conner MRN: [id number] Date of Birth: [birth year]  Today's Date: 02/11/2020 PT Individual Time: 1300-1400 PT Individual Time Calculation (min): 60 min   Therapeutic Intervention: Pt initially OOB in recliner and agreeable to treatment session.  STS to RW w/cga, gait greater than 14f mod I w/RW.   See below for performance with all functional mobility and for performance on BERG.   Berg score of 43/56 discussed with patient and compared to intial score of 21/56.  Discussed continued fall risk/balance deficits/and purpose of OPT to address these deficits.   Pt instructed w/floor transfers and then performed mat to floor to mat safely w/cga only. Pt performed gait greater than 1230fx 2 without AD to/from laundry room w/min assist where she moved clothes from washer to dryer w/supervision using machines for stability. Gait gym to room w/RW mod I greater than 12566f  Pt performs commode transfer mod I and is independent w/hygiene. Gait to recliner mod I w/RW Pt left oob in recliner w/chair alarm set and needs in reach.       Patient has met 11 of 11 long term goals due to improved activity tolerance, improved balance, improved postural control, increased strength and improved coordination.  Patient to discharge at an ambulatory level Modified Independent.   Patient's care partner is able to provide the necessary physical assistance at discharge.  Reasons goals not met: NA  Recommendation:  Patient will benefit from ongoing skilled PT services in outpatient setting to continue to advance safe functional mobility, address ongoing impairments in dynamic balance (43/56 BERG), functional mobility, and strength/endurance, and minimize fall risk.  Equipment: No equipment provided  Reasons for discharge: treatment goals met  Patient/family agrees with progress made and goals achieved: Yes  PT  Discharge Sensation Sensation Light Touch: Appears Intact Proprioception: Appears Intact Coordination Gross Motor Movements are Fluid and Coordinated: Yes Fine Motor Movements are Fluid and Coordinated: No Finger Nose Finger Test: dysmetria L >R improved since eval Heel Shin Test: LLE dysmetria decreased since eval Motor  Motor Motor: Ataxia;Hemiplegia Motor - Skilled Clinical Observations: dysmetria L>R improved since eval Motor - Discharge Observations: dysmetria L>R improved since eval  Mobility Bed Mobility Rolling Right: Independent Rolling Left: Independent Right Sidelying to Sit: Independent Supine to Sit: Independent Sitting - Scoot to Edge of Bed: Independent Sit to Supine: Independent Transfers Transfers: Sit to Stand;Stand to SitLockheed Martinansfers Sit to Stand: Independent with assistive device Stand to Sit: Independent with assistive device Stand Pivot Transfers: Independent with assistive device Transfer (Assistive device): Rolling walker Locomotion  Gait Ambulation: Yes Gait Assistance: Independent with assistive device Gait Distance (Feet): 200 Feet Assistive device: Rolling walker Gait Gait: Yes Gait Pattern: Impaired Gait velocity: without AD mild decreased step length Lvs R, mild increased sway/self corrects, decreased cadence Stairs / Additional Locomotion Stairs: Yes Stairs Assistance: Supervision/Verbal cueing Stair Management Technique: Two rails Number of Stairs: 12 Wheelchair Mobility Wheelchair Mobility: No  Trunk/Postural Assessment  Cervical Assessment Cervical Assessment: Within Functional Limits Thoracic Assessment Thoracic Assessment: Within Functional Limits Lumbar Assessment Lumbar Assessment: Within Functional Limits Postural Control Righting Reactions: d Protective Responses: delayed/standing but improved from eval  Balance Standardized Balance Assessment Standardized Balance Assessment: Berg Balance Test Berg Balance  Test Sit to Stand: Able to stand without using hands and stabilize independently Standing Unsupported: Able to stand safely 2 minutes Sitting with Back Unsupported but Feet Supported on Floor or Stool: Able to sit safely and  securely 2 minutes Stand to Sit: Sits safely with minimal use of hands Transfers: Able to transfer safely, minor use of hands Standing Unsupported with Eyes Closed: Able to stand 10 seconds safely Standing Ubsupported with Feet Together: Able to place feet together independently but unable to hold for 30 seconds From Standing, Reach Forward with Outstretched Arm: Can reach confidently >25 cm (10") From Standing Position, Pick up Object from Floor: Able to pick up shoe, needs supervision From Standing Position, Turn to Look Behind Over each Shoulder: Looks behind from both sides and weight shifts well Turn 360 Degrees: Able to turn 360 degrees safely but slowly Standing Unsupported, Alternately Place Feet on Step/Stool: Able to complete >2 steps/needs minimal assist Standing Unsupported, One Foot in Front: Needs help to step but can hold 15 seconds Standing on One Leg: Able to lift leg independently and hold equal to or more than 3 seconds Total Score: 43 Extremity Assessment  RUE Assessment RUE Assessment: Within Functional Limits LUE Assessment General Strength Comments: see ot eval for full assessment   LLE Assessment General Strength Comments: hip grossly 4/5, otherwise 5/5   Callie Fielding, [place] 02/11/2020, 1:41 PM

## 2020-02-11 NOTE — Progress Notes (Signed)
Speech Language Pathology Discharge Summary  Patient Details  Name: Cristina Conner MRN: [id number] Date of Birth: 01-27-1961  Today's Date: 02/11/2020 SLP Individual Time: 6010-9323 SLP Individual Time Calculation (min): 55 min   Skilled Therapeutic Interventions:  Skilled treatment session focused on cognitive goals. SLP facilitated session by completing the CLQT. Patient scored WFL on all areas of cognitive functioning with the exception of attention in which she scored a mild impairment. However, it appears her low score in symbol trails appeared to skew her score in attention but difficulty with task was due to decreased ability to differentiate size of symbols due to visual-perceptual deficits. SLP also facilitated session by calling the patient's daughter and providing education regarding patient's current cognitive functioning and strategies to utilize at home to maximize independence. She verbalized understanding of all information. Patient left upright in the recliner with alarm on and all needs within reach.    Patient has met 6 of 6 long term goals.  Patient to discharge at overall Modified Independent;Supervision level.   Reasons goals not met: N/A   Clinical Impression/Discharge Summary: Patient has made excellent gains and has met 6 of 6 LTGs this admission. Currently, patient is consuming Dys. 3 textures with thin liquids without overt s/s of aspiration and is overall Mod I for use of swallowing compensatory strategies. Patient prefers to stay on Dys. 3 textures at this time but can slowly integrate regular textures back into her diet per her comfort level. Patient is also overall supervision-Mod I to complete functional and mildly complex tasks safely in regards to problem solving and memory. Patient education is complete and patient will discharge home with family providing 24 hour supervision. Patient would benefit from f/u outpatient SLP services to maximize her cognitive  functioning and overall functional independence.   Care Partner:  Caregiver Able to Provide Assistance: Yes  Type of Caregiver Assistance: Physical;Cognitive  Recommendation:  Outpatient SLP  Rationale for SLP Follow Up: Maximize cognitive function and independence   Equipment: N/A   Reasons for discharge: Discharged from hospital;Treatment goals met   Patient/Family Agrees with Progress Made and Goals Achieved: Yes    Zanyla Klebba, [place] 02/11/2020, 6:20 AM

## 2020-02-11 NOTE — Progress Notes (Signed)
Occupational Therapy Discharge Summary  Patient Details  Name: Cristina Conner MRN: [id number] Date of Birth: 10/23/1960  Today's Date: 02/11/2020 OT Individual Time: 0800-0900 OT Individual Time Calculation (min): 60 min    1:1. Pt received in recliner agreeable to OT wanting to shower. Pt completes all ADLs at set up level with some disoragization gathering items needed for ADLs. Pt requires VC for safety using RW, keeping feet inside walker when reaching outisde BOS, gettting closer to needed items and sitting down to reach for items on floor if she doesn't have AD with her. Pt loads clothing into washing machine and completes all functional mobilty with and without AD at supervision level.Energy conservation handout provided and explained with no questions from patient. OT writes and provides pt with writtend discharge instrucitons and reviews with daughter over the phone. Exited session with pt seated in relciner, exit alarm on and call light in reach   Patient has met 10 of 10 long term goals due to improved activity tolerance, improved balance, postural control, ability to compensate for deficits, functional use of  LEFT upper and LEFT lower extremity, improved attention, improved awareness and improved coordination .  Patient to discharge at overall Supervision - set up level.  Patient's care partner is independent to provide the necessary cognitive assistance at discharge.    Reasons goals not met: n/a  Recommendation:  Patient will benefit from ongoing skilled OT services in outpatient setting to continue to advance functional skills in the area of BADL, iADL and Vocation.  Equipment: shower chair  Reasons for discharge: treatment goals met and discharge from hospital  Patient/family agrees with progress made and goals achieved: Yes  OT Discharge Precautions/Restrictions  Precautions Precautions: Fall Restrictions Weight Bearing Restrictions: No General   Vital  Signs Therapy Vitals Temp: 99 F (37.2 C) Temp Source: Oral Pulse Rate: 80 Resp: 16 BP: 117/76 Patient Position (if appropriate): Lying Oxygen Therapy SpO2: 100 % O2 Device: Room Air FiO2 (%): 21 % Patient Activity (if Appropriate): In bed Pulse Oximetry Type: Intermittent Pain   ADL ADL Eating: Modified independent Grooming: Modified independent Where Assessed-Grooming: Standing at sink Upper Body Bathing: Setup Where Assessed-Upper Body Bathing: Shower Lower Body Bathing: Setup Where Assessed-Lower Body Bathing: Shower Upper Body Dressing: Setup Where Assessed-Upper Body Dressing: Sitting at sink Lower Body Dressing: Setup Where Assessed-Lower Body Dressing: Sitting at sink, Standing at sink Toileting: Setup Where Assessed-Toileting: Glass blower/designer: Distant supervision Armed forces technical officer Method: Counselling psychologist: Energy manager: Distant supervision Social research officer, government: Distant supervision Social research officer, government Method: Heritage manager: Grab bars, Radio broadcast assistant Vision Baseline Vision/History: Wears glasses Wears Glasses: At all times Patient Visual Report: Diplopia Eye Alignment: Within Functional Limits Perception  Perception: Within Functional Limits Praxis Praxis: Intact Cognition Overall Cognitive Status: Impaired/Different from baseline Arousal/Alertness: Awake/alert Orientation Level: Oriented X4 Attention: Selective Sustained Attention: Appears intact Memory: Impaired Memory Impairment: Decreased short term memory Awareness: Appears intact Problem Solving: Appears intact Executive Function: Writer: Impaired Organizing Impairment: Functional complex Safety/Judgment: Appears intact Sensation Sensation Light Touch: Appears Intact Proprioception: Appears Intact Coordination Gross Motor Movements are Fluid and Coordinated: Yes Fine Motor Movements are Fluid and  Coordinated: No Finger Nose Finger Test: dysmetria L >R improved since eval 9 Hole Peg Test: improved since eval see previous notes Motor  Motor Motor: Ataxia;Hemiplegia Motor - Skilled Clinical Observations: dysmetria L>R improved since eval Mobility  Bed Mobility Rolling Right: Independent Rolling Left: Independent Right Sidelying to Sit:  Independent with assistive device Sitting - Scoot to Edge of Bed: Independent with assistive device Transfers Sit to Stand: Set up assist Stand to Sit: Set up assist  Trunk/Postural Assessment  Cervical Assessment Cervical Assessment: Within Functional Limits Thoracic Assessment Thoracic Assessment: Within Functional Limits Lumbar Assessment Lumbar Assessment: Within Functional Limits Postural Control Postural Control: Deficits on evaluation  Balance Balance Balance Assessed: Yes Dynamic Sitting Balance Dynamic Sitting - Level of Assistance: 6: Modified independent (Device/Increase time) Static Standing Balance Static Standing - Level of Assistance: 6: Modified independent (Device/Increase time) Dynamic Standing Balance Dynamic Standing - Level of Assistance: 5: Stand by assistance Extremity/Trunk Assessment RUE Assessment RUE Assessment: Within Functional Limits LUE Assessment LUE Assessment: Exceptions to [facility] Strength Comments: improved grip strength- see previous notes for testing   Tonny Branch 02/11/2020, 6:55 AM

## 2020-02-11 NOTE — Discharge Instructions (Signed)
Inpatient Rehab Discharge Instructions  Cristina Conner [facility] Discharge date and time: 02/12/20    Activities/Precautions/ Functional Status: Activity: no lifting, driving, or strenuous exercise till cleared by MD Diet: soft Wound Care: keep wound clean and dry   Functional status:  ___ No restrictions     ___ Walk up steps independently _X__ 24/7 supervision/assistance   ___ Walk up steps with assistance ___ Intermittent supervision/assistance  ___ Bathe/dress independently ___ Walk with walker     __X_ Bathe/dress with assistance ___ Walk Independently    ___ Shower independently ___ Walk with assistance    ___ Shower with assistance _X__ No alcohol     ___ Return to work/school ________   Special Instructions: 1. Use lacrilube in left eye at bedtime daily--purchase over the counter. Use Systane eyedrops every 2 hours. Keep eye patched as much as possible for now till follow up with MD.   COMMUNITY REFERRALS UPON DISCHARGE:     Outpatient: PT     OT   ST             Agency: [facility] Phone: [phone]             Appointment Date/Time: Please expect follow-up within 7-10 business days to schedule your outpatient therapy. If you have not received follow-up, be sure to contact the site directly.  Medical Equipment/Items Ordered: shower chair                                                 Agency/Supplier: [facility] [phone]    GENERAL COMMUNITY RESOURCES FOR PATIENT/FAMILY: Mental Health: Monday, June 14 at 9:30am with Dr. Apolonio Schneiders. This will be a virtual appointment. Please call if any questions/concerns [phone].   My questions have been answered and I understand these instructions. I will adhere to these goals and the provided educational materials after my discharge from the hospital.  Patient/Caregiver Signature _______________________________ Date __________  Clinician Signature _______________________________________ Date __________  Please  bring this form and your medication list with you to all your follow-up doctor's appointments.

## 2020-02-12 DIAGNOSIS — G510 Bell's palsy: Secondary | ICD-10-CM

## 2020-02-12 NOTE — Progress Notes (Signed)
[place] PHYSICAL MEDICINE & REHABILITATION PROGRESS NOTE   Subjective/Complaints: Up in chair. Daughter at bedside. No new complaints.   ROS: Patient denies fever, rash, sore throat, blurred vision, nausea, vomiting, diarrhea, cough, shortness of breath or chest pain, joint or back pain, headache, or mood change.     Objective:   No results found. No results for input(s): WBC, HGB, HCT, PLT in the last 72 hours. No results for input(s): NA, K, CL, CO2, GLUCOSE, BUN, CREATININE, CALCIUM in the last 72 hours.  Intake/Output Summary (Last 24 hours) at 02/12/2020 1107 Last data filed at 02/12/2020 0900 Gross per 24 hour  Intake 520 ml  Output --  Net 520 ml     Physical Exam: Vital Signs Blood pressure 117/66, pulse 79, temperature 98.6 F (37 C), resp. rate 17, height 5\' 6"  (1.676 m), weight 53.6 kg, last menstrual period 05/01/2009, SpO2 100 %. Constitutional: No distress . Vital signs reviewed. HEENT: EOMI, oral membranes moist. Sclera red OS Neck: supple Cardiovascular: RRR without murmur. No JVD    Respiratory/Chest: CTA Bilaterally without wheezes or rales. Normal effort    GI/Abdomen: BS +, non-tender, non-distended Ext: no clubbing, cyanosis, or edema Psych: pleasant and cooperative Skin: scalp wounds intact.  Musc: No edema in extremities.  No tenderness in extremities. Neuro: Alert Left facial, brow weakness, lid doesn't close Motor: Grossly 4+/5 throughout--no changes  Assessment/Plan: 1. 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  Physiatrist is providing close team supervision and 24 hour management of active medical problems listed below.  Physiatrist and rehab team continue to assess barriers to discharge/monitor patient progress toward functional and medical goals  Care Tool:  Bathing    Body parts bathed by patient: Right arm, Left arm,  Chest, Abdomen, Front perineal area, Buttocks, Right upper leg, Left upper leg, Right lower leg, Left lower leg, Face         Bathing assist Assist Level: Set up assist     Upper Body Dressing/Undressing Upper body dressing   What is the patient wearing?: Pull over shirt    Upper body assist Assist Level: Independent with assistive device    Lower Body Dressing/Undressing Lower body dressing      What is the patient wearing?: Pants     Lower body assist Assist for lower body dressing: Set up assist     Toileting Toileting    Toileting assist Assist for toileting: Set up assist     Transfers Chair/bed transfer  Transfers assist     Chair/bed transfer assist level: Independent with assistive device Chair/bed transfer assistive device: Armrests   Locomotion Ambulation   Ambulation assist      Assist level: Independent with assistive device Assistive device: Walker-rolling Max distance: 150   Walk 10 feet activity   Assist     Assist level: Independent with assistive device Assistive device: Walker-rolling   Walk 50 feet activity   Assist Walk 50 feet with 2 turns activity did not occur: Safety/medical concerns  Assist level: Independent with assistive device Assistive device: Walker-rolling    Walk 150 feet activity   Assist Walk 150 feet activity did not occur: Safety/medical concerns  Assist level: Independent with assistive device Assistive device: Walker-rolling    Walk 10 feet on uneven surface  activity   Assist Walk 10 feet on uneven surfaces activity did not occur: Safety/medical concerns   Assist level: Supervision/Verbal cueing Assistive  device: Aeronautical engineer Will patient use wheelchair at discharge?: No             Wheelchair 50 feet with 2 turns activity    Assist            Wheelchair 150 feet activity     Assist          Blood pressure 117/66, pulse 79,  temperature 98.6 F (37 C), resp. rate 17, height 5\' 6"  (1.676 m), weight 53.6 kg, last menstrual period 05/01/2009, SpO2 100 %.  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   -dc home today. Family ed complete. Answered questions from pt/daughter 2.  Antithrombotics: -DVT/anticoagulation:  she is ambulatory              -antiplatelet therapy: N/a 3. Headaches/Pain Management: Worse at nights--continue hydrocodone prn.  Continue tylenol qid--decreased to 650 mg.   improved on 6/12  4. Mood: LCSW to follow for evaluation and support.  -ego support by team. Seems to be in good spirits at present  -on prozac and buspar for anxiety/depression. (off remeron currently)              -antipsychotic agents: N/A 5. Neuropsych: This patient is capable of making decisions on her own behalf. 6. Skin/Wound Care: Monitor for healing. Routine pressure relief measures.   -removed sutures/staples 6/10---are looks great  -she can shower 7. Fluids/Electrolytes/Nutrition: Monitor I/O.   -Ensure PRN 8. Left VII nerve injury: Continue eye drops 5 X day and patch at night/when asleep.  -reviewed methods to protect left eye from irritation  -will need ophtho f/u as outpt 9. Resting tachycardia: Continue metoprolol and montinue for now. Multifactorial due to surgery, debility, anemia.   Overall controlled 6/12 10. ABLA: Iron dextran 5/23--continue iron supplement.  Hemoglobin 9.2 6/7 (increased)  Continue to monitor 11. HTN:   Controlled on 6/12 13. 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 Small bowel RSXN: Continue Bentyl. Has been having BMs- stopped miralax (has been refusing) as colace bid effective.  15. Vestibular dysfunction: N/V  have resolved.        LOS: 9 days A FACE TO FACE EVALUATION WAS PERFORMED  Meredith Staggers 02/12/2020, 11:07 AM

## 2020-02-12 NOTE — Progress Notes (Signed)
Patient discharged and left unit by wheelchair in stable condition. Daughter to be ride home

## 2020-02-14 ENCOUNTER — Ambulatory Visit: Payer: BC Managed Care – PPO | Admitting: Psychology

## 2020-02-14 NOTE — Progress Notes (Signed)
Inpatient Rehabilitation Care Coordinator  Discharge Note  The overall goal for the admission was met for:   Discharge location: Yes. D/c to home with support from her dtr [name] who will be staying with her until pt is able to be home independently.   Length of Stay: Yes. 8 days.  Discharge activity level: Yes. Supervision.   Home/community participation: Yes. Limited.   Services provided included: MD, RD, PT, OT, SLP, RN, CM, TR, Pharmacy, Neuropsych and SW  Financial Services: Private Insurance: [employer]  Follow-up services arranged: Outpatient: [facility] for PT/OT/ST and DME: [employer] for Shower chair  Mental Health: Monday, June 14 at 9:30am with Dr. Apolonio Schneiders. This will be a virtual appointment. Please call if any questions/concerns [phone].   Comments (or additional information): contact pt [phone] or dtr [name] # [phone]  Patient/Family verbalized understanding of follow-up arrangements: Yes  Individual responsible for coordination of the follow-up plan: Pt to have assistance with care needs.   Confirmed correct DME delivered: Rana Snare 02/14/2020    Rana Snare

## 2020-02-15 ENCOUNTER — Encounter: Payer: Self-pay | Admitting: Occupational Therapy

## 2020-02-15 ENCOUNTER — Encounter: Payer: Self-pay | Admitting: Physical Therapy

## 2020-02-15 ENCOUNTER — Ambulatory Visit: Payer: BC Managed Care – PPO | Attending: Physical Medicine and Rehabilitation | Admitting: Physical Therapy

## 2020-02-15 ENCOUNTER — Other Ambulatory Visit: Payer: Self-pay

## 2020-02-15 ENCOUNTER — Ambulatory Visit: Payer: BC Managed Care – PPO | Admitting: Occupational Therapy

## 2020-02-15 DIAGNOSIS — R278 Other lack of coordination: Secondary | ICD-10-CM

## 2020-02-15 DIAGNOSIS — R41840 Attention and concentration deficit: Secondary | ICD-10-CM | POA: Diagnosis present

## 2020-02-15 DIAGNOSIS — R41844 Frontal lobe and executive function deficit: Secondary | ICD-10-CM | POA: Insufficient documentation

## 2020-02-15 DIAGNOSIS — R42 Dizziness and giddiness: Secondary | ICD-10-CM

## 2020-02-15 DIAGNOSIS — R2681 Unsteadiness on feet: Secondary | ICD-10-CM

## 2020-02-15 DIAGNOSIS — R1311 Dysphagia, oral phase: Secondary | ICD-10-CM | POA: Insufficient documentation

## 2020-02-15 DIAGNOSIS — R2689 Other abnormalities of gait and mobility: Secondary | ICD-10-CM

## 2020-02-15 DIAGNOSIS — R41841 Cognitive communication deficit: Secondary | ICD-10-CM | POA: Insufficient documentation

## 2020-02-15 DIAGNOSIS — M6281 Muscle weakness (generalized): Secondary | ICD-10-CM

## 2020-02-15 NOTE — Therapy (Signed)
[facility] [address] [place], [place], [address] Phone: [phone]   Fax:  [phone]  Occupational Therapy Evaluation  Patient Details  Name: Cristina Conner MRN: [id number] Date of Birth: 1961-06-02 Referring Provider (OT): Dr. Alger Simons   Encounter Date: 02/15/2020   OT End of Session - 02/15/20 1457    Visit Number 1    Number of Visits 25    Date for OT Re-Evaluation 05/15/20    Authorization Type [employer] covered 100%, no visit limit    OT Start Time 1320    OT Stop Time 1405    OT Time Calculation (min) 45 min    Activity Tolerance Patient tolerated treatment well    Behavior During Therapy [facility] for tasks assessed/performed           Past Medical History:  Diagnosis Date  . Anxiety   . Arthritis   . Diverticulosis   . GERD (gastroesophageal reflux disease)   . Hypertension   . Osteopenia   . Post-operative nausea and vomiting    vomiting after general anesthesia per pt.  . Pre-diabetes     Past Surgical History:  Procedure Laterality Date  . CESAREAN SECTION     x 2  . COLONOSCOPY    . CRANIOTOMY Left 01/27/2020   Procedure: Left Craniotomy for Tumor Resection;  Surgeon: Judith Part, MD;  Location: [facility];  Service: Neurosurgery;  Laterality: Left;  . ENDOMETRIAL ABLATION    . ESOPHAGOGASTRODUODENOSCOPY  10/2019  . LAPAROSCOPY N/A 01/19/2020   Procedure: LAPAROSCOPY DIAGNOSTIC LAPAROTOMY WITH SMALL BOWEL RESECTION;  Surgeon: Ileana Roup, MD;  Location: [facility];  Service: General;  Laterality: N/A;  . PELVIC LAPAROSCOPY  1999   left salpingectomy, excision of Right, paratubal cyst  . PELVIC LAPAROSCOPY     laparoscopy with lysis of pelvic adhesions  . SHOULDER SURGERY  11/2010   "FROZEN SHOULDER"  . TOTAL HIP ARTHROPLASTY Left 08/17/2019   Procedure: LEFT TOTAL HIP ARTHROPLASTY ANTERIOR APPROACH;  Surgeon: Melrose Nakayama, MD;  Location: [facility];  Service: Orthopedics;   Laterality: Left;  . TUBAL LIGATION    . UPPER GASTROINTESTINAL ENDOSCOPY    . VENTRICULOSTOMY Left 01/27/2020   Procedure: VENTRICULOSTOMY;  Surgeon: Judith Part, MD;  Location: [facility];  Service: Neurosurgery;  Laterality: Left;    There were no vitals filed for this visit.   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 decr hearing L side    Limitations fall risk, L eye patched due to inability to close eye    Patient Stated Goals wants to be able to drive, improve balance, perform financial management, be able to take care of mother    Currently in Pain? No/denies             [facility] OT Assessment - 02/15/20 1323      Assessment   Medical Diagnosis s/p craniotomy due to unilateral schwannoma    Referring Provider (OT) Dr. Alger Simons    Onset Date/Surgical Date 01/27/20    Hand Dominance Right    Prior  Therapy hospitalized 01/18/20-02/12/20, including CIR      Precautions   Precautions Fall;Other (comment)    Precaution Comments Cranial Nerve Palsy VII with L eye patched      Restrictions   Weight Bearing Restrictions No    Other Position/Activity Restrictions No driving      Balance Screen   Has the patient fallen in the past 6 months No      Home  Environment   Family/patient expects to be discharged to: Private residence    Home Layout One level    Lives With Daughter      Prior Function   Level of Adin Retired   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 mother (managing medication, taking her to doctor, light cleaning/vacuuming, grocery shopping).      IADL   Prior Level of Function Shopping independent    Shopping Needs to be accompanied on any shopping trip    Prior Level of Function Light Housekeeping independent with home maintenance and mowing, watered plants    Prior Level of Function Meal Prep independent (didn't cook much, ate out alot)    Prior Level of Function Scientist, research (physical sciences) Relies on family or friends for transportation    Medication Management Is responsible for taking medication in correct dosages at correct time    Prior Level of Function Financial Management independent    Financial Management --   pt anticipates difficulty     Mobility   Mobility Status Comments Ambulates without device in the home and with single-point cane in the community   see PT eval for details     Written Expression   Dominant Hand Right    Handwriting --   min difficulty     Vision - History   Baseline Vision Wears glasses for distance only   wear all the time   Additional Comments typically wore contacts, wearing glasses currently.  Pt reports no diplopia when L eye is uncovered, but MD instructed pt to keep it covered due to redness/dryness (cranial nerve  palsy VII)      Cognition   Overall Cognitive Status Impaired/Different from baseline    Attention Selective    Memory Impaired    Memory Impairment Decreased short term memory    Awareness Impaired    Awareness Impairment Emergent impairment    Cognition Comments Tabletop visual scanning (number cancellation) with approximately 82% accuarcy      Sensation   Additional Comments pt reports L side of face is numb      Coordination   9 Hole Peg Test Right;Left    Right 9 Hole Peg Test 23.78    Left [address] Peg Test  40.40      ROM / Strength   AROM / PROM / Strength AROM;Strength      AROM   Overall AROM  Within functional limits for tasks performed   BUEs     Strength   Overall Strength Deficits    Overall Strength Comments RUE proximal strenght grossly 5/5, LUE proximal strength grossly 4+ to 5/5      Hand Function   Right Hand Grip (lbs) 60    Left Hand Grip (lbs) 45                           OT Education - 02/15/20 1455    Education Details OT eval results and POC    Person(s) Educated Patient;Child(ren)    Methods Explanation    Comprehension Verbalized understanding            OT Short Term Goals - 02/15/20 1527      OT SHORT TERM GOAL #1   Title Pt will be independent with initial HEP for LUE coordination and strength.--check STGs 03/31/20    Time 6    Period Weeks    Status New    Target Date 03/31/20      OT SHORT TERM GOAL #2   Title Pt will improve L hand coordination for ADLs as shown by improving time on 9-hole peg test by at least 12sec.    Baseline 40.40sec    Time 6    Period Weeks    Status New      OT SHORT TERM GOAL #3   Title Pt will be able to perform tabletop scanning with at least 95% accuracy for incr attention and IADL tasks.    Time 6    Period Weeks    Status New      OT SHORT TERM GOAL #4   Title Pt will perform simple home maintenance tasks mod I.    Time 6    Period Weeks    Status New      OT SHORT TERM  GOAL #5   Title Pt will perform simple financial management tasks with min A.    Time 6    Period Weeks    Status New             OT Long Term Goals - 02/15/20 1532      OT LONG TERM GOAL #1   Title Pt will be independent with updated HEP.--check LTGs 05/15/20    Time 12    Period Weeks    Status New    Target Date 05/15/20      OT LONG TERM GOAL #2   Title Pt will improve L grip strength by at least 8lbs to assist with home maintenance tasks/in prep for yardwork.    Time 12    Period Weeks    Status New      OT LONG TERM GOAL #3   Title Pt will demo improved balance and strength to be able to water plants.    Time 12    Period Weeks    Status New      OT LONG TERM GOAL #4   Title Pt will perform environmental scanning with at least 90% accuracy for incr safety for community activities.    Time 12    Period Weeks    Status New      OT LONG TERM GOAL #5   Title Pt will perform previous financial  management tasks with supervision.    Time 12    Period Weeks    Status New      Long Term Additional Goals   Additional Long Term Goals Yes      OT LONG TERM GOAL #6   Title Pt will be able to alternate attention between at least 1 physical and 1 cognitive task with at least 90% accuracy for incr safety with IADLs.    Time 12    Period Weeks    Status New                 Plan - 02/15/20 1511    Clinical Impression Statement Pt is a 59 y.o. female referred to occupational therapy for unilateral vestibular schwannoma, s/p craniotomy for tumor resection 01/27/20.  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  Pt's dtr currently lives with her and she needs assist for IADLs.  Pt presents today with decr strength, decr coordination, decr balance, cognitive and visual deficits.  Pt would benefit from occupational therapy to address these deficits for improved ADL/IADL performance, incr independence, and return to prior roles.    OT Occupational Profile and History Detailed Assessment- Review of Records and additional review of physical, cognitive, psychosocial history related to current functional performance    Occupational performance deficits (Please refer to evaluation for details): ADL's;IADL's;Work;Leisure;Other    Body Structure / Function / Physical Skills ADL;Hearing;Vision;IADL;Balance;Mobility;Strength;Coordination;FMC;UE functional use    Cognitive Skills Attention;Memory;Perception;Thought    Rehab Potential Good    Clinical Decision Making Several treatment options, min-mod task modification necessary    Comorbidities Affecting Occupational Performance: May have comorbidities impacting occupational performance    Modification or Assistance to Complete Evaluation  Min-Moderate modification of tasks or assist with assess necessary to complete eval    OT Frequency 2x / week    OT Duration 12 weeks   +eval   OT Treatment/Interventions Self-care/ADL training;DME and/or AE instruction;Therapeutic activities;Aquatic Therapy;Therapeutic exercise;Cognitive remediation/compensation;Visual/perceptual remediation/compensation;Functional Mobility Training;Neuromuscular education;Patient/family education    Plan initiate HEP for coordination and L hand strength, attention, visual scanning    Consulted and Agree with Plan of Care Patient;Family member/caregiver    Family Member Consulted dtr           Patient will benefit from skilled therapeutic intervention in order to improve the following deficits and impairments:   Body Structure / Function / Physical Skills: ADL, Hearing, Vision, IADL,  Balance, Mobility, Strength, Coordination, FMC, UE functional use Cognitive Skills: Attention, Memory, Perception, Thought     Visit Diagnosis: Attention and concentration deficit  Frontal lobe and executive function deficit  Other lack of coordination  Muscle weakness (generalized)  Unsteadiness on feet    Problem List Patient Active Problem List   Diagnosis Date Noted  . Cranial nerve VII palsy   . Hypoalbuminemia due to protein-calorie malnutrition ([place])   . Acute blood loss anemia   . Sinus tachycardia   . Postoperative pain   . 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[place]) 02/03/2020  . Protein-calorie malnutrition, severe 01/30/2020  . Brain tumor ([place]) 01/27/2020  . Low folate 01/23/2020  . Anemia of chronic disease 01/23/2020  . Small bowel obstruction from retained endoscopy capsule  at stricture s/p ileal resection 01/19/2020 01/18/2020  . 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 Primary localized osteoarthritis of left hip 23-Aug-2019  . Death of family member-  husband passed Jul 2020 04/29/2019  . 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 Sleep difficulties 09/09/2018  . Pre-diabetes 03/09/2018  . Postmenopausal state- went thru early 40's 01/02/2018  . Primary osteoarthritis of left hip 01/02/2018  . Caregiver burden- for husband with ALLeukemia 01/02/2018  . Left hip pain 01/02/2018  . Osteopenia 07/31/2016  . Hypertension 05/20/2011  . Menopausal state 05/20/2011    [facility] 02/15/2020, 3:37 PM  [facility] [address] [place], [place], [address] Phone: [phone]   Fax:  [phone]  Name: DANIELLY ACKERLEY MRN: [id number] Date of Birth: 04/30/1961   Vianne Bulls, OTR/L [facility] [address] [place], [place]  [address] [phone] phone [phone] 02/15/20 3:37 PM

## 2020-02-16 ENCOUNTER — Ambulatory Visit: Payer: BC Managed Care – PPO | Admitting: Speech Pathology

## 2020-02-16 ENCOUNTER — Encounter: Payer: Self-pay | Admitting: Speech Pathology

## 2020-02-16 DIAGNOSIS — R2689 Other abnormalities of gait and mobility: Secondary | ICD-10-CM | POA: Diagnosis not present

## 2020-02-16 DIAGNOSIS — R1311 Dysphagia, oral phase: Secondary | ICD-10-CM

## 2020-02-16 DIAGNOSIS — R41841 Cognitive communication deficit: Secondary | ICD-10-CM

## 2020-02-16 NOTE — Patient Instructions (Addendum)
   If you exercise your mouth or face, use small movements in a mirror  Make sure you are not getting extra movements on other parts of your face you are not trying to move  Think about just trying to move your left lip or cheek slightly, or raise the left eye slightly  Just pull the meat off of the wings  For now try to avoid foods that crumble such as corn bread, rice and hard foods like celery or pork chops  Other foods just cut into smaller pieces  Good job listening to your body and resting  The doctor said I can only talk for a few minutes - same with visitors - Use the doctor excuse   Tips to help facilitate better attention, concentration, focus   Do harder, longer tasks when you are most alert/awake  Break down larger tasks into small parts  Limit distractions of TV, radio, conversation, e mails/texts, appliance noise, etc - if a job is important, do it in a quiet room  Be aware of how you are functioning in high stimulation environments such as large stores, parties, restaurants - any place with lots of lights, noise, signs etc  Group conversations may be more difficult to process than one on one conversations  Give yourself extra time to process conversation, reading materials, directions or information from your healthcare providers  Organization is key - clutters of laundry, mail, paperwork, dirty dishes - all make it more difficult to concentrate  Before you start a task, have all the needed supplies, directions, recipes ready and organized. This way you don't have to go looking for something in the middle of a task and become distracted.   Be aware of fatigue - take rests or breaks when needed to re-group and re-focus  You may have slightly slow processing and you have some attention difficulty - Be aware you may not be getting every detail in conversations, instructions from your health care providers, insurance etc. Ask for information in writing and let them  know    Get the persons attention before you speak  Use eye contact and face the person you are speaking to  Be in close proximity to the person you are speaking to  Turn down any noise in the environment such as the TV, walk away from loud appliances, air conditioners, fans, dish washers etc

## 2020-02-16 NOTE — Therapy (Signed)
[facility] [address] [place], [place], [address] Phone: [phone]   Fax:  [phone]  Physical Therapy Evaluation  Patient Details  Name: Cristina Conner MRN: [id number] Date of Birth: [birth year] Referring Provider (PT): Reesa Chew, [place]   Encounter Date: 02/15/2020   PT End of Session - 02/16/20 1152    Visit Number 1    Number of Visits 17    Date for PT Re-Evaluation 04/14/20    Authorization Type [employer] - [place]    PT Start Time 1230    PT Stop Time 1315    PT Time Calculation (min) 45 min    Activity Tolerance Patient tolerated treatment well    Behavior During Therapy [facility] for tasks assessed/performed           Past Medical History:  Diagnosis Date  . Anxiety   . Arthritis   . Diverticulosis   . GERD (gastroesophageal reflux disease)   . Hypertension   . Osteopenia   . Post-operative nausea and vomiting    vomiting after general anesthesia per pt.  . Pre-diabetes     Past Surgical History:  Procedure Laterality Date  . CESAREAN SECTION     x 2  . COLONOSCOPY    . CRANIOTOMY Left 01/27/2020   Procedure: Left Craniotomy for Tumor Resection;  Surgeon: Judith Part, MD;  Location: [facility];  Service: Neurosurgery;  Laterality: Left;  . ENDOMETRIAL ABLATION    . ESOPHAGOGASTRODUODENOSCOPY  10/2019  . LAPAROSCOPY N/A 01/19/2020   Procedure: LAPAROSCOPY DIAGNOSTIC LAPAROTOMY WITH SMALL BOWEL RESECTION;  Surgeon: Ileana Roup, MD;  Location: [facility];  Service: General;  Laterality: N/A;  . PELVIC LAPAROSCOPY  1999   left salpingectomy, excision of Right, paratubal cyst  . PELVIC LAPAROSCOPY     laparoscopy with lysis of pelvic adhesions  . SHOULDER SURGERY  11/2010   "FROZEN SHOULDER"  . TOTAL HIP ARTHROPLASTY Left 08/17/2019   Procedure: LEFT TOTAL HIP ARTHROPLASTY ANTERIOR APPROACH;  Surgeon: Melrose Nakayama, MD;  Location: [facility];  Service: Orthopedics;  Laterality: Left;  . TUBAL  LIGATION    . UPPER GASTROINTESTINAL ENDOSCOPY    . VENTRICULOSTOMY Left 01/27/2020   Procedure: VENTRICULOSTOMY;  Surgeon: Judith Part, MD;  Location: [facility];  Service: Neurosurgery;  Laterality: Left;    There were no vitals filed for this visit.    Subjective Assessment - 02/15/20 1234    Subjective Pt is a 59 yr old lady s/p craniotomy on 01-27-20 due to unilateral vestibular schwannoma; pt was in PT for Lt hip and unable to complete course of therapy due to dizziness and nausea - pt underwent evaluation and was found to have a Lt unilateral vestibular schwannoma. Pt was first admitted to hospital on 01-18-20 for hydration and underwent laparoscopy, labs, small bowel resection and capsule removal .  She had tachycardia and elevated BP post-op.  She underwent Lt craniotomy for tumor resection on 01-27-20.  She presents with patch covering her Lt eye to keep it moist due to inability for eye to close.  She is using a SPC for assistance with ambulation.    Patient is accompained by: Family member   [name]   Pertinent History tachycardia, HTN after lapaaroscopic sx on 01-18-20, Lt THR on 08-17-19, anxiety    Diagnostic tests CT revealed hydrocephalus with tumor on Lt side 4th ventricle ;  MRI revealed Lt cerebellar pontine mass compatible with vestibular schwannoma    Patient Stated Goals  get back to doing [employer] - would like to get back to work at [employer]    Currently in Pain? Yes    Pain Score 4     Pain Location Head    Pain Orientation Left   Lt forehead and behind Lt ear   Pain Descriptors / Indicators Sharp    Pain Type Surgical pain    Pain Onset 1 to 4 weeks ago    Pain Frequency Intermittent    Aggravating Factors  Bright lights    Pain Relieving Factors Tylenol              [facility] PT Assessment - 02/16/20 0001      Assessment   Medical Diagnosis s/p craniotomy due to unilateral vestibular schwannoma    Referring Provider (PT) Reesa Chew, PA-C    Onset Date/Surgical  Date 01/27/20    Hand Dominance Right    Prior Therapy hospitalized 01/18/20-02/12/20, including CIR 6- 3- 02-12-20      Precautions   Precautions Fall;Other (comment)    Precaution Comments 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  No    Is the patient reluctant to leave their home because of a fear of falling?  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    Leisure Zumba/group exercise cardio classes      Cognition   Memory Impaired    Memory Impairment Decreased short term Scientist, research (life sciences) Comments pt reports L side of face is numb      ROM / Strength   AROM / PROM / Strength Strength      Strength   Overall Strength Deficits    Overall Strength Comments RLE is grossly 5/5 throughout:  LLE is 4/5 (s/p Lt THR)       Transfers   Transfers Sit to Stand      Ambulation/Gait   Ambulation/Gait Yes    Gait velocity 22.69 secs = 1.45 ft/sec - pt carried SPC;     Stairs Yes    Stair Management Technique Two rails;Alternating pattern;Step to pattern   alternating with ascension; step to with descension    Number of Stairs 4    Height of Stairs 6      Standardized Balance Assessment   Standardized Balance Assessment Timed Up and Go Test      Timed Up and  Go Test   Normal TUG (seconds) 14.57   pt used SPC minimally                 Vestibular Assessment - 02/16/20 0001  Vestibular Assessment   General Observation pt s/p craniotomy due to Lt unilateral vestiibular schwannoma on 01-27-20:  pt denies dizziness, states she has imbalance       Symptom Behavior   Type of Dizziness  Imbalance    Frequency of Dizziness constant imbalance      Oculomotor Exam   Smooth Pursuits Comment   mild nystagmus noted with horizontal   Comment pt reports > dizziness with looking up               Objective measurements completed on examination: See above findings.               PT Education - 02/16/20 1151    Education Details eval results; discussed aquatic therapy; recommended pt to begin SLS ex. at sink - 10 sec hold each leg    Person(s) Educated Patient;Child(ren)    Methods Explanation;Demonstration    Comprehension Verbalized understanding;Returned demonstration            PT Short Term Goals - 02/16/20 1217      PT SHORT TERM GOAL #1   Title Improve Berg balance score by at least 5 points for reduced fall risk.    Baseline TBA    Time 4    Period Weeks    Status New    Target Date 03/17/20      PT SHORT TERM GOAL #2   Title Pt will improve TUG score from 14.57 secs to </= 12 secs without use of SPC for reduced fall risk.    Baseline 14.57 secs - pt carried cane    Time 4    Period Weeks    Status New    Target Date 03/17/20      PT SHORT TERM GOAL #3   Title Pt will ambulate 500' without use of SPC on flat even surfaces with SBA for incr. community accessibility.    Baseline pt using cane - 100' at eval    Time 4    Period Weeks    Status New    Target Date 03/17/20      PT SHORT TERM GOAL #4   Title Pt will amb. 28' with horizontal head turns without device with supervision for safety with environmental scanning.    Time 4    Period Weeks    Status New    Target Date 03/17/20      PT SHORT  TERM GOAL #5   Title Pt will participate in aquatic therapy after permission obtained from MD (craniotomy incision healed).    Time 4    Period Weeks    Status New    Target Date 03/17/20      Additional Short Term Goals   Additional Short Term Goals --      PT SHORT TERM GOAL #6   Title Independent in balance HEP.    Time 4    Period Weeks    Status New    Target Date 03/17/20      PT SHORT TERM GOAL #7   Title Perform SOT & establish goal as appropriate.    Time 4    Period Weeks    Status New    Target Date 04/21/20             PT Long Term Goals - 02/16/20 1224      PT LONG TERM GOAL #1   Title Pt will improve FGA score by at least 8 points from baseline score for incr. safety  with amb.    Baseline TBA    Time 8    Period Weeks    Status New    Target Date 04/14/20      PT LONG TERM GOAL #2   Title Pt will improve Berg balance test by at least 10 points from baseline for reduced fall risk.    Time 8    Period Weeks    Status New    Target Date 04/21/20      PT LONG TERM GOAL #3   Title Pt will amb. 1000' on flat even/uneven srufaces without device with supervision.    Time 8    Period Weeks    Status New    Target Date 04/21/20      PT LONG TERM GOAL #4   Title Pt will be independent in updated HEP including aquatic exercises at time of D/C from PT.    Time 8    Period Weeks    Status New    Target Date 04/21/20      PT LONG TERM GOAL #5   Title Pt will amb. 77' with head turns with no c/o dizziness and no imbalance for incr. safety with environmental scanning.    Time 8    Period Weeks    Status New    Target Date 04/21/20      Additional Long Term Goals   Additional Long Term Goals Yes                  Plan - 02/16/20 1156    Clinical Impression Statement Pt is a 59 yr old lady s/p craniotomy on 01-27-20 due to Lt cerebellar tumor compatible with unilateral vestibular schwannoma.  Pt was in [facility] from 6-3 -02-12-20; daughter has moved  back from [place] to care for her mother, as pt previously lived alone.  Pt denies dizziness, but reports she has significant imbalance.  Pt has patch over Lt eye to retain moisture as Lt eye does not close.  Pt presents with balance and gait deficits and minimal c/o dizziness with head turns.    Personal Factors and Comorbidities Comorbidity 2;Transportation;Behavior Pattern    Comorbidities cranial nerve VII palsy, craniotomy on 01-27-20 for tumor resection, unilateral vestibular schwannoma, anxiety,, s/p Lt THR Dec. 2020    Examination-Activity Limitations Transfers;Locomotion Level;Squat;Stairs;Stand;Carry    Examination-Participation Restrictions Meal Prep;Cleaning;Community Activity;Driving;Shop;Laundry;Interpersonal Relationship    Stability/Clinical Decision Making Evolving/Moderate complexity    Rehab Potential Good    PT Frequency 2x / week    PT Duration 8 weeks    PT Treatment/Interventions Aquatic Therapy;Gait training;Stair training;Therapeutic activities;Therapeutic exercise;Balance training;Neuromuscular re-education;Patient/family education;Vestibular    PT Next Visit Plan do Berg test and initiate balance HEP - kicks, marching, standing on floor with feet together with EO and EC and head turns if able, SLS, partial tandem stance    Recommended Other Services will do aquatic when MD gives permission    Consulted and Agree with Plan of Care Patient;Family member/caregiver    Family Member Consulted [name]           Patient will benefit from skilled therapeutic intervention in order to improve the following deficits and impairments:  Abnormal gait, Decreased balance, Decreased activity tolerance, Decreased coordination, Dizziness, Impaired vision/preception  Visit Diagnosis: Other abnormalities of gait and mobility - Plan: PT plan of care cert/re-cert  Dizziness and giddiness - Plan: PT plan of care cert/re-cert  Muscle weakness (generalized) - Plan: PT plan of care  cert/re-cert  Unsteadiness  on feet - Plan: PT plan of care cert/re-cert     Problem List Patient Active Problem List   Diagnosis Date Noted  . Cranial nerve VII palsy   . Hypoalbuminemia due to protein-calorie malnutrition ([place])   . Acute blood loss anemia   . Sinus tachycardia   . Postoperative pain   . 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[place]) 02/03/2020  . Protein-calorie malnutrition, severe 01/30/2020  . Brain tumor ([place]) 01/27/2020  . Low folate 01/23/2020  . Anemia of chronic disease 01/23/2020  . Small bowel obstruction from retained endoscopy capsule at stricture s/p ileal resection 01/19/2020 01/18/2020  . 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 Primary localized osteoarthritis of left hip 08-30-19  . Death of family member-  husband passed Jul 2020 04/29/2019  . 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 Sleep difficulties 09/09/2018  . Pre-diabetes 03/09/2018  . Postmenopausal state- went thru early 40's 01/02/2018  . Primary osteoarthritis of left hip 01/02/2018  . Caregiver burden- for husband with ALLeukemia 01/02/2018  . Left hip pain 01/02/2018  . Osteopenia 07/31/2016  . Hypertension 05/20/2011  . Menopausal state 05/20/2011    Alda Lea, PT 02/16/2020, 12:35 PM  [facility] [address] [place], [place], [address] Phone: [phone]   Fax:  [phone]  Name: JAMISON YUHASZ MRN: [id number] Date of Birth: 11/11/1960

## 2020-02-17 ENCOUNTER — Ambulatory Visit: Payer: BC Managed Care – PPO | Admitting: Physical Therapy

## 2020-02-17 ENCOUNTER — Other Ambulatory Visit: Payer: Self-pay

## 2020-02-17 ENCOUNTER — Encounter: Payer: Self-pay | Admitting: Physical Therapy

## 2020-02-17 DIAGNOSIS — R2681 Unsteadiness on feet: Secondary | ICD-10-CM

## 2020-02-17 DIAGNOSIS — M6281 Muscle weakness (generalized): Secondary | ICD-10-CM

## 2020-02-17 DIAGNOSIS — R278 Other lack of coordination: Secondary | ICD-10-CM

## 2020-02-17 DIAGNOSIS — R2689 Other abnormalities of gait and mobility: Secondary | ICD-10-CM | POA: Diagnosis not present

## 2020-02-17 NOTE — Therapy (Signed)
[facility] [address] [place], [place], [address] Phone: [phone]   Fax:  [phone]  Physical Therapy Treatment  Patient Details  Name: Cristina Conner MRN: [id number] Date of Birth: May 05, 1961 Referring Provider (PT): Reesa Chew, [place]   Encounter Date: 02/17/2020   PT End of Session - 02/17/20 1150    Visit Number 2    Number of Visits 17    Date for PT Re-Evaluation 04/14/20    Authorization Type [employer] - [place]    PT Start Time 0802    PT Stop Time 0845    PT Time Calculation (min) 43 min    Equipment Utilized During Treatment Gait belt    Activity Tolerance Patient tolerated treatment well    Behavior During Therapy [facility] for tasks assessed/performed           Past Medical History:  Diagnosis Date  . Anxiety   . Arthritis   . Diverticulosis   . GERD (gastroesophageal reflux disease)   . Hypertension   . Osteopenia   . Post-operative nausea and vomiting    vomiting after general anesthesia per pt.  . Pre-diabetes     Past Surgical History:  Procedure Laterality Date  . CESAREAN SECTION     x 2  . COLONOSCOPY    . CRANIOTOMY Left 01/27/2020   Procedure: Left Craniotomy for Tumor Resection;  Surgeon: Judith Part, MD;  Location: [facility] [facility];  Service: Neurosurgery;  Laterality: Left;  . ENDOMETRIAL ABLATION    . ESOPHAGOGASTRODUODENOSCOPY  10/2019  . LAPAROSCOPY N/A 01/19/2020   Procedure: LAPAROSCOPY DIAGNOSTIC LAPAROTOMY WITH SMALL BOWEL RESECTION;  Surgeon: Ileana Roup, MD;  Location: [facility];  Service: General;  Laterality: N/A;  . PELVIC LAPAROSCOPY  1999   left salpingectomy, excision of Right, paratubal cyst  . PELVIC LAPAROSCOPY     laparoscopy with lysis of pelvic adhesions  . SHOULDER SURGERY  11/2010   "FROZEN SHOULDER"  . TOTAL HIP ARTHROPLASTY Left 08/17/2019   Procedure: LEFT TOTAL HIP ARTHROPLASTY ANTERIOR APPROACH;  Surgeon: Melrose Nakayama, MD;  Location: [facility];   Service: Orthopedics;  Laterality: Left;  . TUBAL LIGATION    . UPPER GASTROINTESTINAL ENDOSCOPY    . VENTRICULOSTOMY Left 01/27/2020   Procedure: VENTRICULOSTOMY;  Surgeon: Judith Part, MD;  Location: [facility];  Service: Neurosurgery;  Laterality: Left;    There were no vitals filed for this visit.   Subjective Assessment - 02/17/20 0805    Subjective Tried the one exercise that she was given - standing on one leg. Notices getting up has been easier. Is very cautious.    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 No/denies    Pain Onset 1 to 4 weeks ago              [facility] PT Assessment - 02/17/20 0806      Standardized Balance Assessment   Standardized Balance Assessment Berg Balance Test      Berg Balance Test   Sit to Stand Able to stand without using hands and stabilize  independently    Standing Unsupported Able to stand safely 2 minutes    Sitting with Back Unsupported but Feet Supported on Floor or Stool Able to sit safely and securely 2 minutes    Stand to Sit Sits safely with minimal use of hands    Transfers Able to transfer safely, minor use of hands    Standing Unsupported with Eyes Closed Able to stand 10 seconds safely    Standing Unsupported with Feet Together Able to place feet together independently and stand 1 minute safely    From Standing, Reach Forward with Outstretched Arm Can reach confidently >25 cm (10")    From Standing Position, Pick up Object from Floor Able to pick up shoe, needs supervision    From Standing Position, Turn to Look Behind Over each Shoulder Looks  behind one side only/other side shows less weight shift   incr pain on L side of head when turning to R   Turn 360 Degrees Able to turn 360 degrees safely one side only in 4 seconds or less   to R 4 seconds, to L 4.53   Standing Unsupported, Alternately Place Feet on Step/Stool Able to stand independently and safely and complete 8 steps in 20 seconds    Standing Unsupported, One Foot in Front Able to plae foot ahead of the other independently and hold 30 seconds    Standing on One Leg Able to lift leg independently and hold equal to or more than 3 seconds    Total Score 50    Berg comment: 50/56                      Access Code: [email or web address] URL: [email or web address] Date: 02/17/2020 Prepared by: Janann August  Initiated HEP for BLE strengthening and balance:   Exercises Standing Marching - 2 x daily - 5 x weekly - 3 sets Standing Tandem Balance with Counter Support - 2 x daily - 5 x weekly - 3 sets - 15-20 hold Single Leg Stance with Support - 2 x daily - 5 x weekly - 3 sets - 10 hold Standing Hip Abduction with Counter Support - 1 x daily - 5 x weekly - 2 sets - 10 reps Standing with Head Rotation - 1 x daily - 5 x weekly - 2 sets - 10 reps - standing on foam with eyes open, 2 x 10 head turns, 2 x 10 head nods  Standing Balance with Eyes Closed on Foam - 1 x daily - 5 x weekly - 3 sets - 20-30 hold - with feet apart                       PT Education - 02/17/20 1150    Education Details results of BERG, initial HEP for standing balance    Person(s) Educated Patient    Methods Explanation;Demonstration;Handout    Comprehension Verbalized understanding;Returned demonstration            PT Short Term Goals - 02/17/20 1152      PT SHORT TERM GOAL #1   Title Improve Berg balance score by at least 4 points for reduced fall risk.    Baseline BERG assessed on 02/17/20, scored 50/56.    Time 4    Period Weeks    Status Revised    Target Date  03/17/20      PT SHORT TERM GOAL #2   Title Pt will improve TUG score from 14.57 secs to </=  12 secs without use of SPC for reduced fall risk.    Baseline 14.57 secs - pt carried cane    Time 4    Period Weeks    Status New    Target Date 03/17/20      PT SHORT TERM GOAL #3   Title Pt will ambulate 500' without use of SPC on flat even surfaces with SBA for incr. community accessibility.    Baseline pt using cane - 100' at eval    Time 4    Period Weeks    Status New    Target Date 03/17/20      PT SHORT TERM GOAL #4   Title Pt will amb. 85' with horizontal head turns without device with supervision for safety with environmental scanning.    Time 4    Period Weeks    Status New    Target Date 03/17/20      PT SHORT TERM GOAL #5   Title Pt will participate in aquatic therapy after permission obtained from MD (craniotomy incision healed).    Time 4    Period Weeks    Status New    Target Date 03/17/20      PT SHORT TERM GOAL #6   Title Independent in balance HEP.    Time 4    Period Weeks    Status New    Target Date 03/17/20      PT SHORT TERM GOAL #7   Title Perform SOT & establish goal as appropriate.    Time 4    Period Weeks    Status New    Target Date 04/21/20             PT Long Term Goals - 02/16/20 1224      PT LONG TERM GOAL #1   Title Pt will improve FGA score by at least 8 points from baseline score for incr. safety with amb.    Baseline TBA    Time 8    Period Weeks    Status New    Target Date 04/14/20      PT LONG TERM GOAL #2   Title Pt will improve Berg balance test by at least 10 points from baseline for reduced fall risk.    Time 8    Period Weeks    Status New    Target Date 04/21/20      PT LONG TERM GOAL #3   Title Pt will amb. 1000' on flat even/uneven srufaces without device with supervision.    Time 8    Period Weeks    Status New    Target Date 04/21/20      PT LONG TERM GOAL #4   Title Pt will be independent in  updated HEP including aquatic exercises at time of D/C from PT.    Time 8    Period Weeks    Status New    Target Date 04/21/20      PT LONG TERM GOAL #5   Title Pt will amb. 22' with head turns with no c/o dizziness and no imbalance for incr. safety with environmental scanning.    Time 8    Period Weeks    Status New    Target Date 04/21/20      Additional Long Term Goals   Additional Long Term Goals Yes                 Plan - 02/17/20 1154    Clinical  Impression Statement Performed the BERG today with pt scoring a 50/56, indicating that pt is at a moderate risk for falls, STG revised as appropriate. Focus of today's skilled session was initiating HEP for standing balance and BLE strengthening. Pt limited with performing R head turns, but no reports of dizziness today. Did have mild dizziness when attempting feet together eyes closed on foam for 30 seconds, performing for home with feet apart. Will continue to progress towards LTGs.    Personal Factors and Comorbidities Comorbidity 2;Transportation;Behavior Pattern    Comorbidities cranial nerve VII palsy, craniotomy on 01-27-20 for tumor resection, unilateral vestibular schwannoma, anxiety,, s/p Lt THR Dec. 2020    Examination-Activity Limitations Transfers;Locomotion Level;Squat;Stairs;Stand;Carry    Examination-Participation Restrictions Meal Prep;Cleaning;Community Activity;Driving;Shop;Laundry;Interpersonal Relationship    Stability/Clinical Decision Making Evolving/Moderate complexity    Rehab Potential Good    PT Frequency 2x / week    PT Duration 8 weeks    PT Treatment/Interventions Aquatic Therapy;Gait training;Stair training;Therapeutic activities;Therapeutic exercise;Balance training;Neuromuscular re-education;Patient/family education;Vestibular    PT Next Visit Plan how was HEP? functional BLE strengthening, standing balance on compliant surfaces with head turns, eyes closed. tandem balance. perform SOT and FGA when  appropriate.    Consulted and Agree with Plan of Care Patient;Family member/caregiver    Family Member Consulted [name]           Patient will benefit from skilled therapeutic intervention in order to improve the following deficits and impairments:  Abnormal gait, Decreased balance, Decreased activity tolerance, Decreased coordination, Dizziness, Impaired vision/preception  Visit Diagnosis: Other lack of coordination  Muscle weakness (generalized)  Unsteadiness on feet  Other abnormalities of gait and mobility     Problem List Patient Active Problem List   Diagnosis Date Noted  . Cranial nerve VII palsy   . Hypoalbuminemia due to protein-calorie malnutrition ([place])   . Acute blood loss anemia   . Sinus tachycardia   . Postoperative pain   . 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 Protein-calorie malnutrition, severe 01/30/2020  . Brain tumor ([place]) 01/27/2020  . Low folate 01/23/2020  . Anemia of chronic disease 01/23/2020  . Small bowel obstruction from retained endoscopy capsule at stricture s/p ileal resection 01/19/2020 01/18/2020  . 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 Primary localized osteoarthritis of left hip 21-Aug-2019  . Death of family member-  husband passed Jul 2020 04/29/2019  . 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 Sleep difficulties 09/09/2018  . Pre-diabetes 03/09/2018  . Postmenopausal state- went thru early 40's 01/02/2018  . Primary osteoarthritis of left hip 01/02/2018  . Caregiver burden- for husband with ALLeukemia 01/02/2018  . Left hip pain 01/02/2018  . Osteopenia 07/31/2016  . Hypertension 05/20/2011  . Menopausal state 05/20/2011    Arliss Journey, PT, DPT  02/17/2020, 11:56 AM  [facility] [address] [place], [place], [address] Phone: [phone]   Fax:  [phone]  Name: Cristina Conner MRN:  [id number] Date of Birth: Jan 12, 1961

## 2020-02-17 NOTE — Patient Instructions (Addendum)
Access Code: [email or web address] URL: [email or web address] Date: 02/17/2020 Prepared by: Janann August  Exercises Standing Marching - 2 x daily - 5 x weekly - 3 sets Standing Tandem Balance with Counter Support - 2 x daily - 5 x weekly - 3 sets - 15-20 hold Single Leg Stance with Support - 2 x daily - 5 x weekly - 3 sets - 10 hold Standing Hip Abduction with Counter Support - 1 x daily - 5 x weekly - 2 sets - 10 reps Standing with Head Rotation - 1 x daily - 5 x weekly - 2 sets - 10 reps Standing Balance with Eyes Closed on Foam - 1 x daily - 5 x weekly - 3 sets - 20-30 hold

## 2020-02-18 ENCOUNTER — Other Ambulatory Visit (HOSPITAL_COMMUNITY): Payer: Self-pay | Admitting: Physical Medicine & Rehabilitation

## 2020-02-18 DIAGNOSIS — G510 Bell's palsy: Secondary | ICD-10-CM

## 2020-02-18 NOTE — Therapy (Signed)
[facility] [address] [place], [place], [address] Phone: [phone]   Fax:  [phone]  Speech Language Pathology Evaluation  Patient Details  Name: Cristina Conner MRN: [id number] Date of Birth: 1960-10-31 Referring Provider (SLP): Dr. Alger Simons   Encounter Date: 02/16/2020    Past Medical History:  Diagnosis Date  . Anxiety   . Arthritis   . Diverticulosis   . GERD (gastroesophageal reflux disease)   . Hypertension   . Osteopenia   . Post-operative nausea and vomiting    vomiting after general anesthesia per pt.  . Pre-diabetes     Past Surgical History:  Procedure Laterality Date  . CESAREAN SECTION     x 2  . COLONOSCOPY    . CRANIOTOMY Left 01/27/2020   Procedure: Left Craniotomy for Tumor Resection;  Surgeon: Judith Part, MD;  Location: [facility];  Service: Neurosurgery;  Laterality: Left;  . ENDOMETRIAL ABLATION    . ESOPHAGOGASTRODUODENOSCOPY  10/2019  . LAPAROSCOPY N/A 01/19/2020   Procedure: LAPAROSCOPY DIAGNOSTIC LAPAROTOMY WITH SMALL BOWEL RESECTION;  Surgeon: Ileana Roup, MD;  [facility];  Service: General;  Laterality: N/A;  . PELVIC LAPAROSCOPY  1999   left salpingectomy, excision of Right, paratubal cyst  . PELVIC LAPAROSCOPY     laparoscopy with lysis of pelvic adhesions  . SHOULDER SURGERY  11/2010   "FROZEN SHOULDER"  . TOTAL HIP ARTHROPLASTY Left 08/17/2019   Procedure: LEFT TOTAL HIP ARTHROPLASTY ANTERIOR APPROACH;  Surgeon: Melrose Nakayama, MD;  [facility];  Service: Orthopedics;  Laterality: Left;  . TUBAL LIGATION    . UPPER GASTROINTESTINAL ENDOSCOPY    . VENTRICULOSTOMY Left 01/27/2020   Procedure: VENTRICULOSTOMY;  Surgeon: Judith Part, MD;  Location: [facility];  Service: Neurosurgery;  Laterality: Left;    There were no vitals filed for this visit.       SLP Evaluation [facility] - 02/18/20 1557      SLP Visit Information   SLP Received  On 02/16/20    Referring Provider (SLP) Dr. Alger Simons    Onset Date 01/27/20    Medical Diagnosis vestibular schwannoma      Subjective   Patient/Family Stated Goal "To get back to work and taking care of my mother"      General Information   HPI Unilateral vestibular schwannoma, s/p craniotomy for tumor resection 01/27/20.  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 decr hearing L side     Mobility Status On PT caseload      Prior Functional Status   Cognitive/Linguistic Baseline Within functional limits    Type of Home House     Lives With Alone   daughter moving to [place] to care for her mom   Available Support Family    Vocation Part time employment      Cognition   Overall Cognitive Status Impaired/Different from baseline    Area of Impairment Attention;Memory    Current Attention Level Selective    Attention Alternating;Divided    Alternating Attention Impaired    Alternating Attention Impairment Verbal complex;Functional complex    Divided Attention Impaired    Divided Attention Impairment Verbal basic;Functional basic    Memory Impaired    Memory Impairment Decreased short term memory    Decreased Short  Term Memory Verbal complex;Functional complex      Auditory Comprehension   Overall Auditory Comprehension Appears within functional limits for tasks assessed      Reading Comprehension   Reading Status Not tested      Verbal Expression   Overall Verbal Expression Appears within functional limits for tasks assessed      Written Expression   Dominant Hand Right    Written Expression Not tested      Oral Motor/Sensory Function   Overall Oral Motor/Sensory Function Impaired    Labial ROM Reduced left    Labial  Symmetry Abnormal symmetry left    Labial Strength Reduced Left    Labial Sensation Reduced Left    Labial Coordination Reduced    Lingual ROM Within Functional Limits    Lingual Symmetry Within Functional Limits    Lingual Strength Within Functional Limits    Lingual Sensation Within Functional Limits    Lingual Coordination WFL    Facial ROM Reduced left    Facial Symmetry Left droop   eye patch, not able to close eye    Facial Strength Reduced    Facial Sensation Reduced    Facial Coordination Reduced    Velum Within Functional Limits    Mandible Within Functional Limits      Motor Speech   Overall Motor Speech Impaired    Respiration Within functional limits    Phonation Normal    Resonance Within functional limits    Articulation Impaired    Level of Impairment Conversation    Intelligibility Intelligible    Word 75-100% accurate    Phrase 75-100% accurate    Sentence 75-100% accurate    Conversation 75-100% accurate    Motor Planning Witnin functional limits    Interfering Components Hearing loss    Effective Techniques Over-articulate;Slow rate      Standardized Assessments   Standardized Assessments  --   CLQT completed in CIR 6/11                            SLP Short Term Goals - 02/18/20 1559      SLP SHORT TERM GOAL #1   Title Pt will complete HEP for oral dysphagia/ CN VII palsy with mod I    Time 4    Period Weeks    Status New      SLP SHORT TERM GOAL #2   Title Pt will follow swallow precautions and diet recommendation with mod I    Time 4    Period Weeks    Status New      SLP SHORT TERM GOAL #3   Title Pt will demonstrate anticipatory awareness on high level cognitive linguistic tasks in therapy and at home with rare min A    Time 4    Period Weeks    Status New      SLP SHORT TERM GOAL #4   Title Pt will alternate attention on IADL tasks/high level congitive linguistic tasks with 95% accuracy on each with rare min A     Time 8    Period Weeks    Status New            SLP Long Term Goals - 02/18/20 1601      SLP LONG TERM GOAL #1   Title Pt will utilize compensations for attention to successfully complete IADL's and high level congitive linguistic tasks with 90% accuracy and rare min A  Time 8    Period Weeks    Status New      SLP LONG TERM GOAL #2   Title Pt will generate 4 accomodations/compensations for attention to carryover in workplace with rare min A over 2 sessions    Time 8    Period Weeks    Status New      SLP LONG TERM GOAL #3   Title Pt will tolerate regular diet without avoiding less than 2 foods with rare min A over 2 sessions    Time 8    Period Weeks    Status New             Patient will benefit from skilled therapeutic intervention in order to improve the following deficits and impairments:   Cognitive communication deficit  Dysphagia, oral phase    Problem List Patient Active Problem List   Diagnosis Date Noted  . Cranial nerve VII palsy   . Hypoalbuminemia due to protein-calorie malnutrition ([place])   . Acute blood loss anemia   . Sinus tachycardia   . Postoperative pain   . 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 Protein-calorie malnutrition, severe 01/30/2020  . Brain tumor ([place]) 01/27/2020  . Low folate 01/23/2020  . Anemia of chronic disease 01/23/2020  . Small bowel obstruction from retained endoscopy capsule at stricture s/p ileal resection 01/19/2020 01/18/2020  . 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 Primary localized osteoarthritis of left hip 09-13-19  . Death of family member-  husband passed Jul 2020 04/29/2019  . 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 Sleep difficulties 09/09/2018  . Pre-diabetes 03/09/2018  . Postmenopausal state- went thru early 40's 01/02/2018  . Primary osteoarthritis of left hip 01/02/2018  . Caregiver burden- for husband with ALLeukemia 01/02/2018  . Left  hip pain 01/02/2018  . Osteopenia 07/31/2016  . Hypertension 05/20/2011  . Menopausal state 05/20/2011    Jovanna Hodges, Annye Rusk MS, CCC-SLP 02/18/2020, 4:05 PM  [facility] [address] [place], [place], [address] Phone: [phone]   Fax:  [phone]  Name: NORLEEN XIE MRN: [id number] Date of Birth: [birth year]

## 2020-02-21 ENCOUNTER — Other Ambulatory Visit: Payer: Self-pay

## 2020-02-21 ENCOUNTER — Ambulatory Visit: Payer: BC Managed Care – PPO | Admitting: Physical Therapy

## 2020-02-21 ENCOUNTER — Ambulatory Visit: Payer: BC Managed Care – PPO

## 2020-02-21 DIAGNOSIS — R42 Dizziness and giddiness: Secondary | ICD-10-CM

## 2020-02-21 DIAGNOSIS — R2681 Unsteadiness on feet: Secondary | ICD-10-CM

## 2020-02-21 DIAGNOSIS — R278 Other lack of coordination: Secondary | ICD-10-CM

## 2020-02-21 DIAGNOSIS — M6281 Muscle weakness (generalized): Secondary | ICD-10-CM

## 2020-02-21 DIAGNOSIS — R2689 Other abnormalities of gait and mobility: Secondary | ICD-10-CM

## 2020-02-21 NOTE — Therapy (Signed)
[facility] [address] [place], [place], [address] Phone: [phone]   Fax:  [phone]  Physical Therapy Treatment  Patient Details  Name: TEMPERENCE ZENOR MRN: [id number] Date of Birth: May 05, 1961 Referring Provider (PT): Reesa Chew, [place]   Encounter Date: 02/21/2020   PT End of Session - 02/21/20 0751    Visit Number 3    Number of Visits 17    Date for PT Re-Evaluation 04/14/20    Authorization Type [employer] - [place]    PT Start Time 0744    PT Stop Time 0830    PT Time Calculation (min) 46 min    Equipment Utilized During Treatment Gait belt    Activity Tolerance Patient tolerated treatment well    Behavior During Therapy [facility] for tasks assessed/performed           Past Medical History:  Diagnosis Date  . Anxiety   . Arthritis   . Diverticulosis   . GERD (gastroesophageal reflux disease)   . Hypertension   . Osteopenia   . Post-operative nausea and vomiting    vomiting after general anesthesia per pt.  . Pre-diabetes     Past Surgical History:  Procedure Laterality Date  . CESAREAN SECTION     x 2  . COLONOSCOPY    . CRANIOTOMY Left 01/27/2020   Procedure: Left Craniotomy for Tumor Resection;  Surgeon: Judith Part, MD;  Location: [facility];  Service: Neurosurgery;  Laterality: Left;  . ENDOMETRIAL ABLATION    . ESOPHAGOGASTRODUODENOSCOPY  10/2019  . LAPAROSCOPY N/A 01/19/2020   Procedure: LAPAROSCOPY DIAGNOSTIC LAPAROTOMY WITH SMALL BOWEL RESECTION;  Surgeon: Ileana Roup, MD;  Location: [facility];  Service: General;  Laterality: N/A;  . PELVIC LAPAROSCOPY  1999   left salpingectomy, excision of Right, paratubal cyst  . PELVIC LAPAROSCOPY     laparoscopy with lysis of pelvic adhesions  . SHOULDER SURGERY  11/2010   "FROZEN SHOULDER"  . TOTAL HIP ARTHROPLASTY Left 08/17/2019   Procedure: LEFT TOTAL HIP ARTHROPLASTY ANTERIOR APPROACH;  Surgeon: Melrose Nakayama, MD;  [facility];   Service: Orthopedics;  Laterality: Left;  . TUBAL LIGATION    . UPPER GASTROINTESTINAL ENDOSCOPY    . VENTRICULOSTOMY Left 01/27/2020   Procedure: VENTRICULOSTOMY;  Surgeon: Judith Part, MD;  Location: [facility];  Service: Neurosurgery;  Laterality: Left;    There were no vitals filed for this visit.   Subjective Assessment - 02/21/20 0748    Subjective Patient reports that she rested alot over the weekend. Did all of her HEP except for the ones standing on pillows.    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 No/denies    Pain Onset 1 to 4 weeks ago                             [facility] Adult PT Treatment/Exercise - 02/21/20 0751      Ambulation/Gait   Ambulation/Gait Yes    Ambulation/Gait Assistance 5: Supervision    Ambulation/Gait Assistance  Details Supv overall with gait. PT providing verbal cues for step length. With gait patient demo 1-2 instances of mild sway laterally. All gait training completed w/o AD.     Ambulation Distance (Feet) 345 Feet    Assistive device None    Gait Pattern Step-through pattern    Ambulation Surface Level;Indoor      High Level Balance   High Level Balance Activities Tandem walking;Marching forwards    High Level Balance Comments In // bars, completed tandem walking x 4 laps with no UE support. Completed forward marching x 4 laps w/ light to No UE support. PT providing verbal cues for slowed pace with forward marching to further promote balance and SLS.       Neuro Re-ed    Neuro Re-ed Details  In // bars, standing on airex: completed  alternating toe taps to cones. initially started with 2 UE support, progressed to 1 UE support, and then progressed to No UE support. Pt require increased CGA for steadying with no UE support. Pt demo increased difficulty with SLS on LLE.                Balance Exercises - 02/21/20 0001      Balance Exercises: Standing   Standing Eyes Opened Narrow base of support (BOS);Head turns;Foam/compliant surface;3 reps;30 secs;Limitations    Standing Eyes Opened Limitations Completed with Eyes Open Feet Together on Foam 3 x 30 seconds. On Foam with wide BOS, completed horizontal/vertical head turns 1 x 15 reps. Pt report increased dizziness with vertical head turns    Standing Eyes Closed Wide (BOA);Foam/compliant surface;3 reps;30 secs    Tandem Stance Eyes open;3 reps;30 secs    SLS Eyes open;Solid surface;Intermittent upper extremity support;4 reps;Time    SLS Time 15-20 secs    Other Standing Exercises Comments All standing balance exercises completed in // bars with intermittent CGA. Pt require intermittent CGA from PT for steadying.               PT Short Term Goals - 02/17/20 1152      PT SHORT TERM GOAL #1   Title Improve Berg balance score by at least 4 points for reduced fall risk.    Baseline BERG assessed on 02/17/20, scored 50/56.    Time 4    Period Weeks    Status Revised    Target Date 03/17/20      PT SHORT TERM GOAL #2   Title Pt will improve TUG score from 14.57 secs to </= 12 secs without use of SPC for reduced fall risk.    Baseline 14.57 secs - pt carried cane    Time 4    Period Weeks    Status New    Target Date 03/17/20      PT SHORT TERM GOAL #3   Title Pt will ambulate 500' without use of SPC on flat even surfaces with SBA for incr. community accessibility.    Baseline pt using cane - 100' at eval    Time 4    Period Weeks    Status New    Target Date 03/17/20      PT SHORT TERM GOAL #4   Title Pt will amb. 68' with horizontal head turns without  device with supervision for safety with environmental scanning.    Time 4    Period Weeks    Status New    Target Date 03/17/20      PT SHORT TERM GOAL #5  Title Pt will participate in aquatic therapy after permission obtained from MD (craniotomy incision healed).    Time 4    Period Weeks    Status New    Target Date 03/17/20      PT SHORT TERM GOAL #6   Title Independent in balance HEP.    Time 4    Period Weeks    Status New    Target Date 03/17/20      PT SHORT TERM GOAL #7   Title Perform SOT & establish goal as appropriate.    Time 4    Period Weeks    Status New    Target Date 04/21/20             PT Long Term Goals - 02/16/20 1224      PT LONG TERM GOAL #1   Title Pt will improve FGA score by at least 8 points from baseline score for incr. safety with amb.    Baseline TBA    Time 8    Period Weeks    Status New    Target Date 04/14/20      PT LONG TERM GOAL #2   Title Pt will improve Berg balance test by at least 10 points from baseline for reduced fall risk.    Time 8    Period Weeks    Status New    Target Date 04/21/20      PT LONG TERM GOAL #3   Title Pt will amb. 1000' on flat even/uneven srufaces without device with supervision.    Time 8    Period Weeks    Status New    Target Date 04/21/20      PT LONG TERM GOAL #4   Title Pt will be independent in updated HEP including aquatic exercises at time of D/C from PT.    Time 8    Period Weeks    Status New    Target Date 04/21/20      PT LONG TERM GOAL #5   Title Pt will amb. 26' with head turns with no c/o dizziness and no imbalance for incr. safety with environmental scanning.    Time 8    Period Weeks    Status New    Target Date 04/21/20      Additional Long Term Goals   Additional Long Term Goals Yes                 Plan - 02/21/20 0837    Clinical Impression Statement Today's skilled PT session focused on continued standing balance exerises in // bars. Progressed  balance exercises as tolerated by patient. Pt continues to demo difficulty with SLS, vertical head turns, and balance on complaint surfaces. Also completed gait training w/o AD focused on improved step length and cadence. Pt will continue continue to benefit from skilled PT services to progress toward all goals.    Personal Factors and Comorbidities Comorbidity 2;Transportation;Behavior Pattern;Comorbidity 3+    Comorbidities cranial nerve VII palsy, craniotomy on 01-27-20 for tumor resection, unilateral vestibular schwannoma, anxiety,, s/p Lt THR Dec. 2020    Examination-Activity Limitations Transfers;Locomotion Level;Squat;Stairs;Stand;Carry    Examination-Participation Restrictions Meal Prep;Cleaning;Community Activity;Driving;Shop;Laundry;Interpersonal Relationship    Stability/Clinical Decision Making Evolving/Moderate complexity    Rehab Potential Good    PT Frequency 2x / week    PT Duration 8 weeks    PT Treatment/Interventions Aquatic Therapy;Gait training;Stair training;Therapeutic activities;Therapeutic exercise;Balance training;Neuromuscular re-education;Patient/family education;Vestibular    PT Next Visit Plan Continue functional  BLE strengthening, standing balance on compliant surfaces with head turns, eyes closed. tandem balance. perform SOT and FGA when appropriate.    Consulted and Agree with Plan of Care Patient;Family member/caregiver    Family Member Consulted [name]           Patient will benefit from skilled therapeutic intervention in order to improve the following deficits and impairments:  Abnormal gait, Decreased balance, Decreased activity tolerance, Decreased coordination, Dizziness, Impaired vision/preception  Visit Diagnosis: Other lack of coordination  Muscle weakness (generalized)  Unsteadiness on feet  Other abnormalities of gait and mobility  Dizziness and giddiness     Problem List Patient Active Problem List   Diagnosis Date Noted  .  Cranial nerve VII palsy   . Hypoalbuminemia due to protein-calorie malnutrition ([place])   . Acute blood loss anemia   . Sinus tachycardia   . Postoperative pain   . 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 Protein-calorie malnutrition, severe 01/30/2020  . Brain tumor ([place]) 01/27/2020  . Low folate 01/23/2020  . Anemia of chronic disease 01/23/2020  . Small bowel obstruction from retained endoscopy capsule at stricture s/p ileal resection 01/19/2020 01/18/2020  . 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 Primary localized osteoarthritis of left hip 08/23/19  . Death of family member-  husband passed Jul 2020 04/29/2019  . 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 Sleep difficulties 09/09/2018  . Pre-diabetes 03/09/2018  . Postmenopausal state- went thru early 40's 01/02/2018  . Primary osteoarthritis of left hip 01/02/2018  . Caregiver burden- for husband with ALLeukemia 01/02/2018  . Left hip pain 01/02/2018  . Osteopenia 07/31/2016  . Hypertension 05/20/2011  . Menopausal state 05/20/2011    Jones Bales, PT, DPT 02/21/2020, 8:40 AM  [facility] [address] [place], [place], [address] Phone: [phone]   Fax:  [phone]  Name: PROVIDENCE STIVERS MRN: [id number] Date of Birth: October 22, 1960

## 2020-02-22 ENCOUNTER — Encounter: Payer: Self-pay | Admitting: Speech Pathology

## 2020-02-22 ENCOUNTER — Ambulatory Visit: Payer: BC Managed Care – PPO | Admitting: Occupational Therapy

## 2020-02-22 ENCOUNTER — Ambulatory Visit: Payer: BC Managed Care – PPO | Admitting: Speech Pathology

## 2020-02-22 DIAGNOSIS — R41841 Cognitive communication deficit: Secondary | ICD-10-CM

## 2020-02-22 DIAGNOSIS — R2689 Other abnormalities of gait and mobility: Secondary | ICD-10-CM | POA: Diagnosis not present

## 2020-02-22 NOTE — Therapy (Signed)
[facility] [address] [place], [place], [address] Phone: [phone]   Fax:  [phone]  Speech Language Pathology Treatment  Patient Details  Name: Cristina Conner MRN: [id number] Date of Birth: February 02, 1961 Referring Provider (SLP): Dr. Alger Simons   Encounter Date: 02/22/2020   End of Session - 02/22/20 1508    Visit Number 2    Number of Visits 17    Date for SLP Re-Evaluation 04/12/20    SLP Start Time 1016    SLP Stop Time  1057    SLP Time Calculation (min) 41 min    Activity Tolerance Patient tolerated treatment well           Past Medical History:  Diagnosis Date  . Anxiety   . Arthritis   . Diverticulosis   . GERD (gastroesophageal reflux disease)   . Hypertension   . Osteopenia   . Post-operative nausea and vomiting    vomiting after general anesthesia per pt.  . Pre-diabetes     Past Surgical History:  Procedure Laterality Date  . CESAREAN SECTION     x 2  . COLONOSCOPY    . CRANIOTOMY Left 01/27/2020   Procedure: Left Craniotomy for Tumor Resection;  Surgeon: Judith Part, MD;  Location: [facility];  Service: Neurosurgery;  Laterality: Left;  . ENDOMETRIAL ABLATION    . ESOPHAGOGASTRODUODENOSCOPY  10/2019  . LAPAROSCOPY N/A 01/19/2020   Procedure: LAPAROSCOPY DIAGNOSTIC LAPAROTOMY WITH SMALL BOWEL RESECTION;  Surgeon: Ileana Roup, MD;  [facility];  Service: General;  Laterality: N/A;  . PELVIC LAPAROSCOPY  1999   left salpingectomy, excision of Right, paratubal cyst  . PELVIC LAPAROSCOPY     laparoscopy with lysis of pelvic adhesions  . SHOULDER SURGERY  11/2010   "FROZEN SHOULDER"  . TOTAL HIP ARTHROPLASTY Left 08/17/2019   Procedure: LEFT TOTAL HIP ARTHROPLASTY ANTERIOR APPROACH;  Surgeon: Melrose Nakayama, MD;  [facility];  Service: Orthopedics;  Laterality: Left;  . TUBAL LIGATION    . UPPER GASTROINTESTINAL ENDOSCOPY    . VENTRICULOSTOMY Left 01/27/2020    Procedure: VENTRICULOSTOMY;  Surgeon: Judith Part, MD;  Location: [facility];  Service: Neurosurgery;  Laterality: Left;    There were no vitals filed for this visit.   Subjective Assessment - 02/22/20 1022    Subjective "I've been coming here almost every day to work on this"    Currently in Pain? No/denies                 ADULT SLP TREATMENT - 02/22/20 1023      General Information   Behavior/Cognition Alert;Cooperative;Pleasant mood      Treatment Provided   Treatment provided Cognitive-Linquistic      Cognitive-Linquistic Treatment   Treatment focused on Cognition;Patient/family/caregiver education    Skilled Treatment Pt is going to pay bills this week, I instructed her to be well rested and have no distractions when she does this. Alternating attention organizing information of hours clients worked out in gym and how many hours each week the gym was utilized. Ivin Booty required usual mod A to plan organization strategy. She spontaeously used finger to stay on the correct line. She independently double checked her calculations when she noted  a possible discrepancy. 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 Jorie alternated attention between interruptions and oraganiztion/calculation task  SLP Education - 02/22/20 1059    Education Details compensations for attention with bill paying;    Person(s) Educated Patient    Methods Explanation;Verbal cues;Handout    Comprehension Verbalized understanding;Need further instruction            SLP Short Term Goals - 02/22/20 1507      SLP SHORT TERM GOAL #1   Title Pt will complete HEP for oral dysphagia/ CN VII palsy with mod I    Time 4    Period Weeks    Status On-going      SLP SHORT TERM GOAL #2   Title Pt will follow swallow precautions and diet recommendation with mod I    Time 4    Period  Weeks    Status On-going      SLP SHORT TERM GOAL #3   Title Pt will demonstrate anticipatory awareness on high level cognitive linguistic tasks in therapy and at home with rare min A    Time 4    Period Weeks    Status On-going      SLP SHORT TERM GOAL #4   Title Pt will alternate attention on IADL tasks/high level congitive linguistic tasks with 95% accuracy on each with rare min A    Time 8    Period Weeks    Status On-going            SLP Long Term Goals - 02/22/20 1508      SLP LONG TERM GOAL #1   Title Pt will utilize compensations for attention to successfully complete IADL's and high level congitive linguistic tasks with 90% accuracy and rare min A    Time 8    Period Weeks    Status On-going      SLP LONG TERM GOAL #2   Title Pt will generate 4 accomodations/compensations for attention to carryover in workplace with rare min A over 2 sessions    Time 8    Period Weeks    Status On-going      SLP LONG TERM GOAL #3   Title Pt will tolerate regular diet without avoiding less than 2 foods with rare min A over 2 sessions    Time 8    Period Weeks    Status On-going            Plan - 02/22/20 1100    Clinical Impression Statement [place] training in compensations for high level attention 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 Continue skilled ST    Speech Therapy Frequency 2x / week    Duration --   8 weeks or 17 visits   Treatment/Interventions [place] precaution training;Environmental controls;Cueing hierarchy;Oral motor exercises;SLP instruction and feedback;Compensatory strategies;Functional tasks;Cognitive reorganization;Compensatory techniques;Diet toleration management by SLP;Trials of upgraded texture/liquids;Internal/external aids;Multimodal communcation approach;Patient/family education    Potential to Achieve Goals Good           Patient will benefit from skilled  therapeutic intervention in order to improve the following deficits and impairments:   Cognitive communication deficit    Problem List Patient Active Problem List   Diagnosis Date Noted  . Cranial nerve VII palsy   . Hypoalbuminemia due to protein-calorie malnutrition ([place])   . Acute blood loss anemia   . Sinus tachycardia   . Postoperative pain   . 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 Protein-calorie malnutrition, severe 01/30/2020  . Brain tumor ([place]) 01/27/2020  .  Low folate 01/23/2020  . Anemia of chronic disease 01/23/2020  . Small bowel obstruction from retained endoscopy capsule at stricture s/p ileal resection 01/19/2020 01/18/2020  . 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 Primary localized osteoarthritis of left hip 09/08/2019  . Death of family member-  husband passed Jul 2020 04/29/2019  . 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 Sleep difficulties 09/09/2018  . Pre-diabetes 03/09/2018  . Postmenopausal state- went thru early 40's 01/02/2018  . Primary osteoarthritis of left hip 01/02/2018  . Caregiver burden- for husband with ALLeukemia 01/02/2018  . Left hip pain 01/02/2018  . Osteopenia 07/31/2016  . Hypertension 05/20/2011  . Menopausal state 05/20/2011    Khairi Garman, Annye Rusk MS, CCC-SLP 02/22/2020, 3:09 PM  [facility] [address] [place], [place], [address] Phone: [phone]   Fax:  [phone]   Name: TAMELA ELSAYED MRN: [id number] Date of Birth: 02/11/1961

## 2020-02-22 NOTE — Patient Instructions (Signed)
   Cristina Conner did a great job noticing possible discrepancies in her information organization and calculations and double checking independently  We role played receiving phone calls and desk questions during her organization and calculations, She did great keeping track of where she was and if she got distracted, she was aware of it and started over from the top independently  When you go to pay bills, make sure you are well rested (maybe earlier in the day) and have no distractions (TV, music, [name] etc.) Have [name] be there to watch and make sure you are accurate - double check!!

## 2020-02-23 ENCOUNTER — Other Ambulatory Visit: Payer: Self-pay

## 2020-02-23 ENCOUNTER — Ambulatory Visit: Payer: BC Managed Care – PPO

## 2020-02-23 VITALS — BP 111/77 | HR 82

## 2020-02-23 DIAGNOSIS — R2689 Other abnormalities of gait and mobility: Secondary | ICD-10-CM | POA: Diagnosis not present

## 2020-02-23 DIAGNOSIS — R278 Other lack of coordination: Secondary | ICD-10-CM

## 2020-02-23 DIAGNOSIS — R42 Dizziness and giddiness: Secondary | ICD-10-CM

## 2020-02-23 DIAGNOSIS — R2681 Unsteadiness on feet: Secondary | ICD-10-CM

## 2020-02-23 NOTE — Therapy (Signed)
[facility] [address] [place], [place], [address] Phone: [phone]   Fax:  [phone]  Physical Therapy Treatment  Patient Details  Name: Cristina Conner MRN: [id number] Date of Birth: Oct 30, 1960 Referring Provider (PT): Reesa Chew, [place]   Encounter Date: 02/23/2020   PT End of Session - 02/23/20 0806    Visit Number 4    Number of Visits 17    Date for PT Re-Evaluation 04/14/20    Authorization Type [employer] - [place]    PT Start Time 0800    PT Stop Time 0845    PT Time Calculation (min) 45 min    Equipment Utilized During Treatment Gait belt    Activity Tolerance Patient tolerated treatment well    Behavior During Therapy [facility] for tasks assessed/performed           Past Medical History:  Diagnosis Date  . Anxiety   . Arthritis   . Diverticulosis   . GERD (gastroesophageal reflux disease)   . Hypertension   . Osteopenia   . Post-operative nausea and vomiting    vomiting after general anesthesia per pt.  . Pre-diabetes     Past Surgical History:  Procedure Laterality Date  . CESAREAN SECTION     x 2  . COLONOSCOPY    . CRANIOTOMY Left 01/27/2020   Procedure: Left Craniotomy for Tumor Resection;  Surgeon: Judith Part, MD;  Location: [facility];  Service: Neurosurgery;  Laterality: Left;  . ENDOMETRIAL ABLATION    . ESOPHAGOGASTRODUODENOSCOPY  10/2019  . LAPAROSCOPY N/A 01/19/2020   Procedure: LAPAROSCOPY DIAGNOSTIC LAPAROTOMY WITH SMALL BOWEL RESECTION;  Surgeon: Ileana Roup, MD;  Location: [facility];  Service: General;  Laterality: N/A;  . PELVIC LAPAROSCOPY  1999   left salpingectomy, excision of Right, paratubal cyst  . PELVIC LAPAROSCOPY     laparoscopy with lysis of pelvic adhesions  . SHOULDER SURGERY  11/2010   "FROZEN SHOULDER"  . TOTAL HIP ARTHROPLASTY Left 08/17/2019   Procedure: LEFT TOTAL HIP ARTHROPLASTY ANTERIOR APPROACH;  Surgeon: Melrose Nakayama, MD;  Location[facility];   Service: Orthopedics;  Laterality: Left;  . TUBAL LIGATION    . UPPER GASTROINTESTINAL ENDOSCOPY    . VENTRICULOSTOMY Left 01/27/2020   Procedure: VENTRICULOSTOMY;  Surgeon: Judith Part, MD;  Location: [facility];  Service: Neurosurgery;  Laterality: Left;    Vitals:   02/23/20 0809  BP: 111/77  Pulse: 82     Subjective Assessment - 02/23/20 0803    Subjective Patient reports that she was feeling lightheaded this morning and staggering. No other new complaints.    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 No/denies    Pain Onset 1 to 4 weeks ago                             [facility] Adult PT Treatment/Exercise - 02/23/20 0815      Ambulation/Gait   Ambulation/Gait Yes    Ambulation/Gait Assistance 5: Supervision    Ambulation Distance (Feet)  230 Feet   x 2   Assistive device None    Gait Pattern Step-through pattern    Ambulation Surface Level;Indoor    Gait Comments Completed horizontal/vertical head turns with gait, completed 2 x 127ft each. Pt demo more unsteadiness with vertical head turns, specifically looking upwards. PT providing increased CGA with vertical head turns.       High Level Balance   High Level Balance Activities Tandem walking    High Level Balance Comments Completed tandem walking x 4 laps in // bars with no UE support.       Neuro Re-ed    Neuro Re-ed Details  Standing outside // bars, using bar as support as needed. Standing on blue mat, completed alternating toe taps to cones with side stepping. Intitially completed with single UE support and progressed  to no UE support.                Balance Exercises - 02/23/20 0001      Balance Exercises: Standing   Rockerboard Anterior/posterior;Lateral;EO;EC;Limitations    Rockerboard Limitations Completed standing on rockerboard positioned ant/post with focus on holding steady, 3 x 1 minute. Progressed to EC, 2 x 30 seconds. Completed standing on rockerboard laterally, 2 x 1 min each. Progressed to EC, 2 x 30 secs. Patient demonstrate increased difficulty with maintaining board level and maintain balance with EC on the board positioned laterally.     Balance Beam Completed static standing across blue balance beam, with focus on holding steady 3 x 45 secs.  Progressed to completed side stepping across beam x 4 laps. Patient require increased time for completion to maintain balance between each step. Completed initially with light UE support.                PT Short Term Goals - 02/17/20 1152      PT SHORT TERM GOAL #1   Title Improve Berg balance score by at least 4 points for reduced fall risk.    Baseline BERG assessed on 02/17/20, scored 50/56.    Time 4    Period Weeks    Status Revised    Target Date 03/17/20      PT SHORT TERM GOAL #2   Title Pt will improve TUG score from 14.57 secs to </= 12 secs without use of SPC for reduced fall risk.    Baseline 14.57 secs - pt carried cane    Time 4    Period Weeks    Status New    Target Date 03/17/20      PT SHORT TERM GOAL #3   Title Pt will ambulate 500' without use of SPC on flat even surfaces with SBA for incr. community accessibility.    Baseline pt using cane - 100' at eval    Time 4    Period Weeks    Status New    Target Date 03/17/20      PT SHORT TERM GOAL #4   Title Pt will amb. 39' with horizontal head turns without device with supervision for safety with environmental scanning.    Time 4    Period Weeks    Status New    Target Date 03/17/20      PT SHORT TERM GOAL #5   Title Pt will participate in aquatic  therapy after permission obtained from MD (craniotomy incision healed).    Time 4    Period Weeks    Status New    Target Date 03/17/20  PT SHORT TERM GOAL #6   Title Independent in balance HEP.    Time 4    Period Weeks    Status New    Target Date 03/17/20      PT SHORT TERM GOAL #7   Title Perform SOT & establish goal as appropriate.    Time 4    Period Weeks    Status New    Target Date 04/21/20             PT Long Term Goals - 02/16/20 1224      PT LONG TERM GOAL #1   Title Pt will improve FGA score by at least 8 points from baseline score for incr. safety with amb.    Baseline TBA    Time 8    Period Weeks    Status New    Target Date 04/14/20      PT LONG TERM GOAL #2   Title Pt will improve Berg balance test by at least 10 points from baseline for reduced fall risk.    Time 8    Period Weeks    Status New    Target Date 04/21/20      PT LONG TERM GOAL #3   Title Pt will amb. 1000' on flat even/uneven srufaces without device with supervision.    Time 8    Period Weeks    Status New    Target Date 04/21/20      PT LONG TERM GOAL #4   Title Pt will be independent in updated HEP including aquatic exercises at time of D/C from PT.    Time 8    Period Weeks    Status New    Target Date 04/21/20      PT LONG TERM GOAL #5   Title Pt will amb. 49' with head turns with no c/o dizziness and no imbalance for incr. safety with environmental scanning.    Time 8    Period Weeks    Status New    Target Date 04/21/20      Additional Long Term Goals   Additional Long Term Goals Yes                 Plan - 02/23/20 1034    Clinical Impression Statement Continued progression of dynamic balance activities, focusing on complaint surfaces and vision removed. With dynamic gait activites, patietn demonstrate increased difficulty with vertical head turns. All gait completed w/o AD and CGA as needed. Overall no activities increased dizziness in today's  session. Pt will continue to benefit from skilled PT services to improve functional mobility.    Personal Factors and Comorbidities Comorbidity 2;Transportation;Behavior Pattern;Comorbidity 3+    Comorbidities cranial nerve VII palsy, craniotomy on 01-27-20 for tumor resection, unilateral vestibular schwannoma, anxiety,, s/p Lt THR Dec. 2020    Examination-Activity Limitations Transfers;Locomotion Level;Squat;Stairs;Stand;Carry    Examination-Participation Restrictions Meal Prep;Cleaning;Community Activity;Driving;Shop;Laundry;Interpersonal Relationship    Stability/Clinical Decision Making Evolving/Moderate complexity    Rehab Potential Good    PT Frequency 2x / week    PT Duration 8 weeks    PT Treatment/Interventions Aquatic Therapy;Gait training;Stair training;Therapeutic activities;Therapeutic exercise;Balance training;Neuromuscular re-education;Patient/family education;Vestibular    PT Next Visit Plan Update HEP (as needed). Continue functional BLE strengthening, standing balance on compliant surfaces with head turns, eyes closed. tandem balance. perform SOT and FGA when appropriate.    Consulted and Agree with Plan of Care Patient;Family member/caregiver    Family Member Consulted [name]  Patient will benefit from skilled therapeutic intervention in order to improve the following deficits and impairments:  Abnormal gait, Decreased balance, Decreased activity tolerance, Decreased coordination, Dizziness, Impaired vision/preception  Visit Diagnosis: Unsteadiness on feet  Other abnormalities of gait and mobility  Dizziness and giddiness  Other lack of coordination     Problem List Patient Active Problem List   Diagnosis Date Noted  . Cranial nerve VII palsy   . Hypoalbuminemia due to protein-calorie malnutrition ([facility])   . Acute blood loss anemia   . Sinus tachycardia   . Postoperative pain   . 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[facility]) 02/03/2020  .  Protein-calorie malnutrition, severe 01/30/2020  . Brain tumor ([facility]) 01/27/2020  . Low folate 01/23/2020  . Anemia of chronic disease 01/23/2020  . Small bowel obstruction from retained endoscopy capsule at stricture s/p ileal resection 01/19/2020 01/18/2020  . 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 Primary localized osteoarthritis of left hip 2019/09/12  . Death of family member-  husband passed Jul 2020 04/29/2019  . 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 Sleep difficulties 09/09/2018  . Pre-diabetes 03/09/2018  . Postmenopausal state- went thru early 40's 01/02/2018  . Primary osteoarthritis of left hip 01/02/2018  . Caregiver burden- for husband with ALLeukemia 01/02/2018  . Left hip pain 01/02/2018  . Osteopenia 07/31/2016  . Hypertension 05/20/2011  . Menopausal state 05/20/2011    Jones Bales, PT, DPT 02/23/2020, 10:38 AM  [facility] [address] [place], [place], [address] Phone: [phone]   Fax:  [phone]  Name: Cristina Conner MRN: [id number] Date of Birth: 1961-01-30

## 2020-02-24 ENCOUNTER — Other Ambulatory Visit: Payer: Self-pay | Admitting: Family Medicine

## 2020-02-24 ENCOUNTER — Ambulatory Visit: Payer: BC Managed Care – PPO | Admitting: Speech Pathology

## 2020-02-24 ENCOUNTER — Encounter: Payer: Self-pay | Admitting: Speech Pathology

## 2020-02-24 DIAGNOSIS — R2689 Other abnormalities of gait and mobility: Secondary | ICD-10-CM | POA: Diagnosis not present

## 2020-02-24 DIAGNOSIS — R41841 Cognitive communication deficit: Secondary | ICD-10-CM

## 2020-02-24 NOTE — Therapy (Signed)
[facility] [address] [place], [place], [address] Phone: [phone]   Fax:  [phone]  Speech Language Pathology Treatment  Patient Details  Name: Cristina Conner MRN: [id number] Date of Birth: [birth year] Referring Provider (SLP): Dr. Alger Simons   Encounter Date: 02/24/2020   End of Session - 02/24/20 1017    Visit Number 3    Number of Visits 17    Date for SLP Re-Evaluation 04/12/20    SLP Start Time 0932    SLP Stop Time  1014    SLP Time Calculation (min) 42 min    Activity Tolerance Patient tolerated treatment well           Past Medical History:  Diagnosis Date   Anxiety    Arthritis    Diverticulosis    GERD (gastroesophageal reflux disease)    Hypertension    Osteopenia    Post-operative nausea and vomiting    vomiting after general anesthesia per pt.   Pre-diabetes     Past Surgical History:  Procedure Laterality Date   CESAREAN SECTION     x 2   COLONOSCOPY     CRANIOTOMY Left 01/27/2020   Procedure: Left Craniotomy for Tumor Resection;  Surgeon: Judith Part, MD;  Location: [facility];  Service: Neurosurgery;  Laterality: Left;   ENDOMETRIAL ABLATION     ESOPHAGOGASTRODUODENOSCOPY  10/2019   LAPAROSCOPY N/A 01/19/2020   Procedure: LAPAROSCOPY DIAGNOSTIC LAPAROTOMY WITH SMALL BOWEL RESECTION;  Surgeon: Ileana Roup, MD;  [facility];  Service: General;  Laterality: N/A;   PELVIC LAPAROSCOPY  1999   left salpingectomy, excision of Right, paratubal cyst   PELVIC LAPAROSCOPY     laparoscopy with lysis of pelvic adhesions   SHOULDER SURGERY  11/2010   "FROZEN SHOULDER"   TOTAL HIP ARTHROPLASTY Left 08/17/2019   Procedure: LEFT TOTAL HIP ARTHROPLASTY ANTERIOR APPROACH;  Surgeon: Melrose Nakayama, MD;  [facility];  Service: Orthopedics;  Laterality: Left;   TUBAL LIGATION     UPPER GASTROINTESTINAL ENDOSCOPY     VENTRICULOSTOMY Left 01/27/2020    Procedure: VENTRICULOSTOMY;  Surgeon: Judith Part, MD;  Location: [facility];  Service: Neurosurgery;  Laterality: Left;    There were no vitals filed for this visit.   Subjective Assessment - 02/24/20 0946    Subjective "[name] wanted me to get some paperwork for [place] to do over the weekend"    Currently in Pain? No/denies                 ADULT SLP TREATMENT - 02/24/20 0951      General Information   Behavior/Cognition Alert;Cooperative;Pleasant mood      Cognitive-Linquistic Treatment   Treatment focused on Cognition;Patient/family/caregiver education    Skilled Treatment Pt brought in her notebook requesting A to organize it. Occasional min A for organizing book. Problem solving, attention to details, and alternating attention staying in a given budget and attending the the specific types of food on the list. I added in interruptions, and Filippa required extended time to get back on task, however she did so independently. She required cues to attend to left side of page.             SLP Education - 02/24/20 1013    Education Details energy conservation, coimpensations for attention and memory            SLP Short Term Goals - 02/24/20 1016      SLP SHORT TERM  GOAL #1   Title Pt will complete HEP for oral dysphagia/ CN VII palsy with mod I    Time 4    Period Weeks    Status On-going      SLP SHORT TERM GOAL #2   Title Pt will follow swallow precautions and diet recommendation with mod I    Time 4    Period Weeks    Status On-going      SLP SHORT TERM GOAL #3   Title Pt will demonstrate anticipatory awareness on high level cognitive linguistic tasks in therapy and at home with rare min A    Time 4    Period Weeks    Status On-going      SLP SHORT TERM GOAL #4   Title Pt will alternate attention on IADL tasks/high level congitive linguistic tasks with 95% accuracy on each with rare min A    Time 8    Period Weeks    Status On-going            SLP  Long Term Goals - 02/24/20 1016      SLP LONG TERM GOAL #1   Title Pt will utilize compensations for attention to successfully complete IADL's and high level congitive linguistic tasks with 90% accuracy and rare min A    Time 8    Period Weeks    Status On-going      SLP LONG TERM GOAL #2   Title Pt will generate 4 accomodations/compensations for attention to carryover in workplace with rare min A over 2 sessions    Time 8    Period Weeks    Status On-going      SLP LONG TERM GOAL #3   Title Pt will tolerate regular diet without avoiding less than 2 foods with rare min A over 2 sessions    Time 8    Period Weeks    Status On-going            Plan - 02/24/20 1016    Clinical Impression Statement [place] training in compensations for high level attention 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 Continue skilled ST    Speech Therapy Frequency 2x / week    Treatment/Interventions Aspiration precaution training;Environmental controls;Cueing hierarchy;Oral motor exercises;SLP instruction and feedback;Compensatory strategies;Functional tasks;Cognitive reorganization;Compensatory techniques;Diet toleration management by SLP;Trials of upgraded texture/liquids;Internal/external aids;Multimodal communcation approach;Patient/family education    Potential to Achieve Goals Good           Patient will benefit from skilled therapeutic intervention in order to improve the following deficits and impairments:   Cognitive communication deficit    Problem List Patient Active Problem List   Diagnosis Date Noted   Cranial nerve VII palsy    Hypoalbuminemia due to protein-calorie malnutrition ([facility])    Acute blood loss anemia    Sinus tachycardia    Postoperative pain    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[place]) 02/03/2020   Protein-calorie malnutrition, severe 01/30/2020   Brain tumor ([place]) 01/27/2020   Low  folate 01/23/2020   Anemia of chronic disease 01/23/2020   Small bowel obstruction from retained endoscopy capsule at stricture s/p ileal resection 01/19/2020 01/18/2020   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 2020 09/07/2019   Hemoglobin low 09/07/2019   Primary localized osteoarthritis of left hip 2019/09/13   Death of family member-  husband passed Jul 2020 04/29/2019   Reactive depression 04/01/2019   Sleep difficulties 09/09/2018  Pre-diabetes 03/09/2018   Postmenopausal state- went thru early 40's 01/02/2018   Primary osteoarthritis of left hip 01/02/2018   Caregiver burden- for husband with ALLeukemia 01/02/2018   Left hip pain 01/02/2018   Osteopenia 07/31/2016   Hypertension 05/20/2011   Menopausal state 05/20/2011    Jianni Shelden, Annye Rusk MS, CCC-SLP 02/24/2020, 10:18 AM  [facility] [address] [place], [place], [address] Phone: [phone]   Fax:  [phone]   Name: Cristina Conner MRN: [id number] Date of Birth: 07/28/1961

## 2020-02-24 NOTE — Patient Instructions (Addendum)
   Maybe call Dr. Charm Barges office and let them know your eye is really red and sometimes sticks to the patch. Ask if you should be taping it shut.  Review the energy conservation sheet under the ST section  If you need to do something important, (like bills) make sure you are well rested  If you are going out in the evening, rest during the day  If you have an especially busy day, you may find that you are more fatigued the following day  The more sensory stimulation an environment has, your brain has to work harder to filter it out and you may find yourself agitated or fatigued  It's OK to rest in your car in between errands to conserve energy  Go in the store, get what you need and get out.   The [employer] is a HIGH stimulation place - consider starting back part time and take breaks in a quiet place during your day

## 2020-02-25 ENCOUNTER — Other Ambulatory Visit: Payer: Self-pay | Admitting: Physician Assistant

## 2020-02-28 ENCOUNTER — Ambulatory Visit: Payer: BC Managed Care – PPO

## 2020-02-28 ENCOUNTER — Other Ambulatory Visit: Payer: Self-pay

## 2020-02-28 DIAGNOSIS — R2681 Unsteadiness on feet: Secondary | ICD-10-CM

## 2020-02-28 DIAGNOSIS — R42 Dizziness and giddiness: Secondary | ICD-10-CM

## 2020-02-28 DIAGNOSIS — M6281 Muscle weakness (generalized): Secondary | ICD-10-CM

## 2020-02-28 DIAGNOSIS — R2689 Other abnormalities of gait and mobility: Secondary | ICD-10-CM

## 2020-02-28 NOTE — Therapy (Signed)
[facility] [address] [place], [place], [address] Phone: [phone]   Fax:  [phone]  Physical Therapy Treatment  Patient Details  Name: Cristina Conner MRN: [id number] Date of Birth: 1961/04/29 Referring Provider (PT): Reesa Chew, [place]   Encounter Date: 02/28/2020   PT End of Session - 02/28/20 0753    Visit Number 5    Number of Visits 17    Date for PT Re-Evaluation 04/14/20    Authorization Type BCBS - [place]    PT Start Time 0746    PT Stop Time 0827    PT Time Calculation (min) 41 min    Equipment Utilized During Treatment Gait belt    Activity Tolerance Patient tolerated treatment well    Behavior During Therapy [facility] for tasks assessed/performed           Past Medical History:  Diagnosis Date  . Anxiety   . Arthritis   . Diverticulosis   . GERD (gastroesophageal reflux disease)   . Hypertension   . Osteopenia   . Post-operative nausea and vomiting    vomiting after general anesthesia per pt.  . Pre-diabetes     Past Surgical History:  Procedure Laterality Date  . CESAREAN SECTION     x 2  . COLONOSCOPY    . CRANIOTOMY Left 01/27/2020   Procedure: Left Craniotomy for Tumor Resection;  Surgeon: Judith Part, MD;  Location: [place];  Service: Neurosurgery;  Laterality: Left;  . ENDOMETRIAL ABLATION    . ESOPHAGOGASTRODUODENOSCOPY  10/2019  . LAPAROSCOPY N/A 01/19/2020   Procedure: LAPAROSCOPY DIAGNOSTIC LAPAROTOMY WITH SMALL BOWEL RESECTION;  Surgeon: Ileana Roup, MD;  [facility];  Service: General;  Laterality: N/A;  . PELVIC LAPAROSCOPY  1999   left salpingectomy, excision of Right, paratubal cyst  . PELVIC LAPAROSCOPY     laparoscopy with lysis of pelvic adhesions  . SHOULDER SURGERY  11/2010   "FROZEN SHOULDER"  . TOTAL HIP ARTHROPLASTY Left 08/17/2019   Procedure: LEFT TOTAL HIP ARTHROPLASTY ANTERIOR APPROACH;  Surgeon: Melrose Nakayama, MD;  [facility];   Service: Orthopedics;  Laterality: Left;  . TUBAL LIGATION    . UPPER GASTROINTESTINAL ENDOSCOPY    . VENTRICULOSTOMY Left 01/27/2020   Procedure: VENTRICULOSTOMY;  Surgeon: Judith Part, MD;  Location: [place];  Service: Neurosurgery;  Laterality: Left;    There were no vitals filed for this visit.   Subjective Assessment - 02/28/20 0750    Subjective Patient reports that she has been feeling well. No reports of lightheadness/dizziness recently. No falls.    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 No/denies    Pain Onset 1 to 4 weeks ago              [facility] PT Assessment - 02/28/20 0754      Functional Gait  Assessment   Gait assessed  Yes    Gait Level Surface Walks 20 ft in less than 7 sec but greater than 5.5 sec, uses assistive device, slower speed, mild gait deviations, or deviates  6-10 in outside of the 12 in walkway width.    Change in Gait Speed Able to change speed, demonstrates mild gait deviations, deviates 6-10 in outside of the 12 in walkway width, or no gait deviations, unable to achieve a major change in velocity, or uses a change in velocity, or uses an assistive device.    Gait with Horizontal Head Turns Performs head turns smoothly with slight change in gait velocity (eg, minor disruption to smooth gait path), deviates 6-10 in outside 12 in walkway width, or uses an assistive device.    Gait with Vertical Head Turns Performs task with slight change in gait velocity (eg, minor disruption to smooth gait path), deviates 6 - 10 in outside 12 in walkway width or uses assistive device      Gait and Pivot Turn Pivot turns safely in greater than 3 sec and stops with no loss of balance, or pivot turns safely within 3 sec and stops with mild imbalance, requires small steps to catch balance.    Step Over Obstacle Is able to step over one shoe box (4.5 in total height) without changing gait speed. No evidence of imbalance.    Gait with Narrow Base of Support Ambulates 4-7 steps.    Gait with Eyes Closed Walks 20 ft, slow speed, abnormal gait pattern, evidence for imbalance, deviates 10-15 in outside 12 in walkway width. Requires more than 9 sec to ambulate 20 ft.    Ambulating Backwards Walks 20 ft, slow speed, abnormal gait pattern, evidence for imbalance, deviates 10-15 in outside 12 in walkway width.    Steps Alternating feet, must use rail.    Total Score 17    FGA comment: 17/30 = High Risk for Falls                               Balance Exercises - 02/28/20 0001      Balance Exercises: Standing   Standing Eyes Opened Narrow base of support (BOS);Head turns;Foam/compliant surface    Standing Eyes Opened Limitations completed 1 x 10 reps of horizontal/vertical head turns. Patient demo dificulty with vertical > horizontal head turns.     Standing Eyes Closed Narrow base of support (BOS);Head turns;3 reps;30 secs;Limitations    Standing Eyes Closed Limitations Pt demo increased difficulty with EC    Rockerboard Anterior/posterior;Lateral;EO;EC;Intermittent UE support    Rockerboard Limitations Completed static standing on rockerboard positioned ant/post and lateral with focus on maintaining steady, Progressed to EC as tolerated by patient. Patient demo increased difficulty with maintain balance with rockerboard positioned laterally with EC. Completed all 3 x 30-45 secs as tolerated.              PT Education - 02/28/20 0831    Education Details results of FGA, educated to complete tandem with eyes closed on firm due to difficulty with complaint surfaces     Person(s) Educated Patient    Methods Explanation;Demonstration    Comprehension Verbalized understanding;Returned demonstration            PT Short Term Goals - 02/17/20 1152      PT SHORT TERM GOAL #1   Title Improve Berg balance score by at least 4 points for reduced fall risk.    Baseline BERG assessed on 02/17/20, scored 50/56.    Time 4    Period Weeks    Status Revised    Target Date 03/17/20      PT  SHORT TERM GOAL #2   Title Pt will improve TUG score from 14.57 secs to </= 12 secs without use of SPC for reduced fall risk.    Baseline 14.57 secs - pt carried cane    Time 4    Period Weeks    Status New    Target Date 03/17/20      PT SHORT TERM GOAL #3   Title Pt will ambulate 500' without use of SPC on flat even surfaces with SBA for incr. community accessibility.    Baseline pt using cane - 100' at eval    Time 4    Period Weeks    Status New    Target Date 03/17/20      PT SHORT TERM GOAL #4   Title Pt will amb. 2' with horizontal head turns without device with supervision for safety with environmental scanning.    Time 4    Period Weeks    Status New    Target Date 03/17/20      PT SHORT TERM GOAL #5   Title Pt will participate in aquatic therapy after permission obtained from MD (craniotomy incision healed).    Time 4    Period Weeks    Status New    Target Date 03/17/20      PT SHORT TERM GOAL #6   Title Independent in balance HEP.    Time 4    Period Weeks    Status New    Target Date 03/17/20      PT SHORT TERM GOAL #7   Title Perform SOT & establish goal as appropriate.    Time 4    Period Weeks    Status New    Target Date 04/21/20             PT Long Term Goals - 02/16/20 1224      PT LONG TERM GOAL #1   Title Pt will improve FGA score by at least 8 points from baseline score for incr. safety with amb.    Baseline TBA    Time 8    Period Weeks    Status New    Target Date 04/14/20      PT LONG TERM GOAL #2   Title Pt  will improve Berg balance test by at least 10 points from baseline for reduced fall risk.    Time 8    Period Weeks    Status New    Target Date 04/21/20      PT LONG TERM GOAL #3   Title Pt will amb. 1000' on flat even/uneven srufaces without device with supervision.    Time 8    Period Weeks    Status New    Target Date 04/21/20      PT LONG TERM GOAL #4   Title Pt will be independent in updated HEP including aquatic exercises at time of D/C from PT.    Time 8    Period Weeks    Status New    Target Date 04/21/20      PT LONG TERM GOAL #5   Title Pt will amb. 51' with head turns with no c/o dizziness and no imbalance for incr. safety with environmental scanning.    Time 8    Period Weeks    Status New    Target Date 04/21/20      Additional Long Term Goals   Additional Long Term Goals Yes  Plan - 02/28/20 0833    Clinical Impression Statement Today's skilled PT session focused on intitial assessment of FGA, with patient scoring a 17/30 demonstrating high risk for falls. Patient demo increased difficulty with gait with eyes closed and tandem walking. Continued balance actvities with focus on complaint surfaces and vision removed. No reports of dizziness with activities today. patient does demo difficulty maintaining balance with vision removed, require frequent UE support and CGA for steadying at this time.    Personal Factors and Comorbidities Comorbidity 2;Transportation;Behavior Pattern;Comorbidity 3+    Comorbidities cranial nerve VII palsy, craniotomy on 01-27-20 for tumor resection, unilateral vestibular schwannoma, anxiety,, s/p Lt THR Dec. 2020    Examination-Activity Limitations Transfers;Locomotion Level;Squat;Stairs;Stand;Carry    Examination-Participation Restrictions Meal Prep;Cleaning;Community Activity;Driving;Shop;Laundry;Interpersonal Relationship    Stability/Clinical Decision Making Evolving/Moderate complexity    Rehab Potential Good      PT Frequency 2x / week    PT Duration 8 weeks    PT Treatment/Interventions Aquatic Therapy;Gait training;Stair training;Therapeutic activities;Therapeutic exercise;Balance training;Neuromuscular re-education;Patient/family education;Vestibular    PT Next Visit Plan Update HEP (as needed). Continue functional BLE strengthening, standing balance on compliant surfaces with head turns, eyes closed. tandem balance. perform SOT when appropriate.    Consulted and Agree with Plan of Care Patient;Family member/caregiver    Family Member Consulted daughter Cristina Conner           Patient will benefit from skilled therapeutic intervention in order to improve the following deficits and impairments:  Abnormal gait, Decreased balance, Decreased activity tolerance, Decreased coordination, Dizziness, Impaired vision/preception  Visit Diagnosis: Unsteadiness on feet  Other abnormalities of gait and mobility  Muscle weakness (generalized)  Dizziness and giddiness     Problem List Patient Active Problem List   Diagnosis Date Noted  . Cranial nerve VII palsy   . Hypoalbuminemia due to protein-calorie malnutrition ([place])   . Acute blood loss anemia   . Sinus tachycardia   . Postoperative pain   . 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 Protein-calorie malnutrition, severe 01/30/2020  . Brain tumor ([place]) 01/27/2020  . Low folate 01/23/2020  . Anemia of chronic disease 01/23/2020  . Small bowel obstruction from retained endoscopy capsule at stricture s/p ileal resection 01/19/2020 01/18/2020  . 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 Primary localized osteoarthritis of left hip August 26, 2019  . Death of family member-  husband passed Jul 2020 04/29/2019  . 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 Sleep difficulties 09/09/2018  . Pre-diabetes 03/09/2018  . Postmenopausal state- went thru early 40's 01/02/2018  . Primary osteoarthritis of left hip  01/02/2018  . Caregiver burden- for husband with ALLeukemia 01/02/2018  . Left hip pain 01/02/2018  . Osteopenia 07/31/2016  . Hypertension 05/20/2011  . Menopausal state 05/20/2011    Jones Bales, PT, DPT 02/28/2020, 8:36 AM  [facility] [address] [place], [place], [address] Phone: [phone]   Fax:  [phone]  Name: Cristina Conner MRN: [id number] Date of Birth: 16-Jun-1961

## 2020-02-29 ENCOUNTER — Ambulatory Visit: Payer: BC Managed Care – PPO | Admitting: Occupational Therapy

## 2020-02-29 ENCOUNTER — Encounter: Payer: Self-pay | Admitting: Occupational Therapy

## 2020-02-29 ENCOUNTER — Ambulatory Visit: Payer: BC Managed Care – PPO | Admitting: Physical Therapy

## 2020-02-29 DIAGNOSIS — R2681 Unsteadiness on feet: Secondary | ICD-10-CM

## 2020-02-29 DIAGNOSIS — M6281 Muscle weakness (generalized): Secondary | ICD-10-CM

## 2020-02-29 DIAGNOSIS — R2689 Other abnormalities of gait and mobility: Secondary | ICD-10-CM | POA: Diagnosis not present

## 2020-02-29 DIAGNOSIS — R41844 Frontal lobe and executive function deficit: Secondary | ICD-10-CM

## 2020-02-29 DIAGNOSIS — R278 Other lack of coordination: Secondary | ICD-10-CM

## 2020-02-29 DIAGNOSIS — R41840 Attention and concentration deficit: Secondary | ICD-10-CM

## 2020-02-29 NOTE — Patient Instructions (Addendum)
  Coordination Activities  Perform the following activities for 20 minutes 1 times per day with left hand(s).  Make sure to keep your shoulder down   Rotate ball in fingertips (clockwise and counter-clockwise).  Toss ball in air and catch with the same hand.  Flip cards 1 at a time as fast as you can.  Deal cards with your thumb (Hold deck in hand and push card off top with thumb).  Shuffle cards.  Rotate 2 golf balls in your hand  Pick up coins and stack.  Pick up coins one at a time until you get 5-10 in your hand, then move coins from palm to fingertips to place in container or coin bank one at a time.  Practice typing.  Squeeze putty with your whole hand x15-20.  Roll putty out into roll, then pinch with each finger/thumb down length of putty.

## 2020-02-29 NOTE — Therapy (Signed)
[facility] [address] [place], [place], [address] Phone: [phone]   Fax:  [phone]  Occupational Therapy Treatment  Patient Details  Name: Cristina Conner MRN: [id number] Date of Birth: 1961-02-04 Referring Provider (OT): Dr. Alger Simons   Encounter Date: 02/29/2020   OT End of Session - 02/29/20 0810    Visit Number 2    Number of Visits 25    Date for OT Re-Evaluation 05/15/20    Authorization Type BCBS covered 100%, no visit limit    OT Start Time 0806    OT Stop Time 0845    OT Time Calculation (min) 39 min    Activity Tolerance Patient tolerated treatment well    Behavior During Therapy [facility] for tasks assessed/performed           Past Medical History:  Diagnosis Date  . Anxiety   . Arthritis   . Diverticulosis   . GERD (gastroesophageal reflux disease)   . Hypertension   . Osteopenia   . Post-operative nausea and vomiting    vomiting after general anesthesia per pt.  . Pre-diabetes     Past Surgical History:  Procedure Laterality Date  . CESAREAN SECTION     x 2  . COLONOSCOPY    . CRANIOTOMY Left 01/27/2020   Procedure: Left Craniotomy for Tumor Resection;  Surgeon: Judith Part, MD;  Location: [place];  Service: Neurosurgery;  Laterality: Left;  . ENDOMETRIAL ABLATION    . ESOPHAGOGASTRODUODENOSCOPY  10/2019  . LAPAROSCOPY N/A 01/19/2020   Procedure: LAPAROSCOPY DIAGNOSTIC LAPAROTOMY WITH SMALL BOWEL RESECTION;  Surgeon: Ileana Roup, MD;  [facility];  Service: General;  Laterality: N/A;  . PELVIC LAPAROSCOPY  1999   left salpingectomy, excision of Right, paratubal cyst  . PELVIC LAPAROSCOPY     laparoscopy with lysis of pelvic adhesions  . SHOULDER SURGERY  11/2010   "FROZEN SHOULDER"  . TOTAL HIP ARTHROPLASTY Left 08/17/2019   Procedure: LEFT TOTAL HIP ARTHROPLASTY ANTERIOR APPROACH;  Surgeon: Melrose Nakayama, MD;  [facility];  Service: Orthopedics;   Laterality: Left;  . TUBAL LIGATION    . UPPER GASTROINTESTINAL ENDOSCOPY    . VENTRICULOSTOMY Left 01/27/2020   Procedure: VENTRICULOSTOMY;  Surgeon: Judith Part, MD;  Location: [place];  Service: Neurosurgery;  Laterality: Left;    There were no vitals filed for this visit.   Subjective Assessment - 02/29/20 0808    Subjective  pt reports that vision is not double with patch off but it is fuzzy--sees Dr. Naaman Plummer tommorrow.  Pt reports that she stood in shower using grab bars.  L hand is a little shaky.  Pt reports doing dishes and folding towels and putting them away.    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 decr hearing L side    Limitations fall risk, L eye patched due to inability to close eye    Patient Stated Goals wants to be able to drive, improve balance, perform financial management, be able to take care of mother    Currently in Pain? Yes    Pain Score 2     Pain Location Eye    Pain Orientation Left    Pain Descriptors /  Indicators Aching    Pain Type Surgical pain    Pain Onset 1 to 4 weeks ago    Pain Frequency Intermittent    Aggravating Factors  nothing    Pain Relieving Factors tylenol           Placing small pegs in pegboard with L hand to copy design with good accuracy and min difficulty with coordination.          OT Education - 02/29/20 0816    Education Details Coordination/Green Putty HEP--see pt instructions    Person(s) Educated Patient    Methods Explanation;Verbal cues;Handout    Comprehension Verbalized understanding;Returned demonstration            OT Short Term Goals - 02/29/20  0843      OT SHORT TERM GOAL #1   Title Pt will be independent with initial HEP for LUE coordination and strength.--check STGs 03/31/20    Time 6    Period Weeks    Status New    Target Date 03/31/20      OT SHORT TERM GOAL #2   Title Pt will improve L hand coordination for ADLs as shown by improving time on 9-hole peg test by at least 12sec.    Baseline 40.40sec    Time 6    Period Weeks    Status New      OT SHORT TERM GOAL #3   Title Pt will be able to perform tabletop scanning with at least 95% accuracy for incr attention and IADL tasks.    Time 6    Period Weeks    Status New      OT SHORT TERM GOAL #4   Title Pt will perform simple home maintenance tasks mod I.    Time 6    Period Weeks    Status On-going      OT SHORT TERM GOAL #5   Title Pt will perform simple financial management tasks with min A.    Time 6    Period Weeks    Status New             OT Long Term Goals - 02/15/20 1532      OT LONG TERM GOAL #1   Title Pt will be independent with updated HEP.--check LTGs 05/15/20    Time 12    Period Weeks    Status New    Target Date 05/15/20      OT LONG TERM GOAL #2   Title Pt will improve L grip strength by at least 8lbs to assist with home maintenance tasks/in prep for yardwork.    Time 12    Period Weeks    Status New      OT LONG TERM GOAL #3   Title Pt will demo improved balance and strength to be able to water plants.    Time 12    Period Weeks    Status New      OT LONG TERM GOAL #4   Title Pt will perform environmental scanning with at least 90% accuracy for incr safety for community activities.    Time 12    Period Weeks    Status New      OT LONG TERM GOAL #5   Title Pt will perform previous financial management tasks with supervision.    Time 12    Period Weeks    Status New      Long Term Additional Goals   Additional Long Term Goals  Yes      OT LONG TERM GOAL #6   Title Pt will be able to alternate attention between at  least 1 physical and 1 cognitive task with at least 90% accuracy for incr safety with IADLs.    Time 12    Period Weeks    Status New                 Plan - 02/29/20 8916    Clinical Impression Statement Pt is progressing towards goals with good understanding of initial HEP for L hand.    OT Occupational Profile and History Detailed Assessment- Review of Records and additional review of physical, cognitive, psychosocial history related to current functional performance    Occupational performance deficits (Please refer to evaluation for details): ADL's;IADL's;Work;Leisure;Other    Body Structure / Function / Physical Skills ADL;Hearing;Vision;IADL;Balance;Mobility;Strength;Coordination;FMC;UE functional use    Cognitive Skills Attention;Memory;Perception;Thought    Rehab Potential Good    Clinical Decision Making Several treatment options, min-mod task modification necessary    Comorbidities Affecting Occupational Performance: May have comorbidities impacting occupational performance    Modification or Assistance to Complete Evaluation  Min-Moderate modification of tasks or assist with assess necessary to complete eval    OT Frequency 2x / week    OT Duration 12 weeks   +eval   OT Treatment/Interventions Self-care/ADL training;DME and/or AE instruction;Therapeutic activities;Aquatic Therapy;Therapeutic exercise;Cognitive remediation/compensation;Visual/perceptual remediation/compensation;Functional Mobility Training;Neuromuscular education;Patient/family education    Plan attention, visual scanning    Consulted and Agree with Plan of Care Patient;Family member/caregiver    Family Member Consulted dtr           Patient will benefit from skilled therapeutic intervention in order to improve the following deficits and impairments:   Body Structure / Function / Physical Skills: ADL, Hearing, Vision, IADL, Balance, Mobility, Strength, Coordination, FMC, UE functional use Cognitive  Skills: Attention, Memory, Perception, Thought     Visit Diagnosis: Unsteadiness on feet  Other abnormalities of gait and mobility  Muscle weakness (generalized)  Other lack of coordination  Attention and concentration deficit  Frontal lobe and executive function deficit    Problem List Patient Active Problem List   Diagnosis Date Noted  . Cranial nerve VII palsy   . Hypoalbuminemia due to protein-calorie malnutrition ([place])   . Acute blood loss anemia   . Sinus tachycardia   . Postoperative pain   . 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 Protein-calorie malnutrition, severe 01/30/2020  . Brain tumor ([place]) 01/27/2020  . Low folate 01/23/2020  . Anemia of chronic disease 01/23/2020  . Small bowel obstruction from retained endoscopy capsule at stricture s/p ileal resection 01/19/2020 01/18/2020  . 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 Primary localized osteoarthritis of left hip 2019-08-19  . Death of family member-  husband passed Jul 2020 04/29/2019  . 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 Sleep difficulties 09/09/2018  . Pre-diabetes 03/09/2018  . Postmenopausal state- went thru early 40's 01/02/2018  . Primary osteoarthritis of left hip 01/02/2018  . Caregiver burden- for husband with ALLeukemia 01/02/2018  . Left hip pain 01/02/2018  . Osteopenia 07/31/2016  . Hypertension 05/20/2011  . Menopausal state 05/20/2011    [facility] 02/29/2020, 8:49 AM  [facility] [address] [place], [place], [address] Phone: [phone]   Fax:  [phone]  Name: Cristina Conner MRN: [id number] Date of Birth: 1961-04-15   Vianne Bulls, OTR/L [place] [phone]  [place],   [id number] [phone] phone [phone] 02/29/20 8:49 AM

## 2020-03-01 ENCOUNTER — Encounter: Payer: Self-pay | Admitting: Speech Pathology

## 2020-03-01 ENCOUNTER — Ambulatory Visit: Payer: BC Managed Care – PPO | Admitting: Speech Pathology

## 2020-03-01 ENCOUNTER — Encounter: Payer: Self-pay | Admitting: Physical Medicine & Rehabilitation

## 2020-03-01 ENCOUNTER — Encounter
Payer: BC Managed Care – PPO | Attending: Physical Medicine & Rehabilitation | Admitting: Physical Medicine & Rehabilitation

## 2020-03-01 ENCOUNTER — Encounter: Payer: Self-pay | Admitting: Physical Therapy

## 2020-03-01 ENCOUNTER — Other Ambulatory Visit: Payer: Self-pay

## 2020-03-01 VITALS — BP 150/92 | HR 68 | Temp 98.6°F | Ht 66.0 in | Wt 124.0 lb

## 2020-03-01 DIAGNOSIS — R2689 Other abnormalities of gait and mobility: Secondary | ICD-10-CM | POA: Diagnosis not present

## 2020-03-01 DIAGNOSIS — D333 Benign neoplasm of cranial nerves: Secondary | ICD-10-CM | POA: Insufficient documentation

## 2020-03-01 DIAGNOSIS — R41841 Cognitive communication deficit: Secondary | ICD-10-CM

## 2020-03-01 DIAGNOSIS — G510 Bell's palsy: Secondary | ICD-10-CM | POA: Insufficient documentation

## 2020-03-01 NOTE — Progress Notes (Signed)
Subjective:    Patient ID: Cristina Conner, female    DOB: 08-17-1961, 59 y.o.   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 Focus has been on gait, strength, and balance. Her balance is improving but is still her biggest problem.  She uses a straight cane for balance.  She denies any problems with falls or near mishaps at home.  Cognitively she feels that she is getting close to her baseline.  She still is seeing speech-language pathology for higher level deficits.  She is still having irritiation of her left eye. She's using multiple drops and gels. She uses a rather large black eye patch during the daytime and only gauze at nighttime.  Her daughter bought her a swim goggle to wear but the patient has not used those yet.  He does not report any changes in her vision on the left side.  She is having some ongoing pain over the left eye as well is but near the left ear.  She sees Dr. Gevena Cotton in about 3 weeks for assessment.  Ivin Booty asked about Prozac and BuSpar which were on her medication list when she came over to [place] on rehab.  She had been on these after her husband passed away but has not taken them for some time.  She stopped taking them when she got home and really has been doing fine.  She also asked about Bentyl which is another medication she had taken in the past.  Her bowels are staying fairly regular using MiraLAX and Colace daily.  She does have a history of a small bowel resection.  Sleep has been reasonable for the most part.  Appetite is improving and she is seems to be putting on some weight that she lost while in the hospital. Pain Inventory Average Pain 2 Pain Right Now 0 My pain is intermittent, sharp and stabbing  In the last 24 hours, has pain interfered with the following? General activity 2 Relation with others 0 Enjoyment of life 0 What TIME of day is your pain at  its worst? daytime Sleep (in general) Good  Pain is worse with: n/a Pain improves with: medication Relief from Meds: n/a  Mobility walk without assistance walk with assistance use a cane how many minutes can you walk? 5 ability to climb steps?  yes do you drive?  no Do you have any goals in this area?  yes  Function not employed: date last employed . retired  Neuro/Psych numbness  Prior Studies Any changes since last visit?  no  Physicians involved in your care Any changes since last visit?  no   Family History  Problem Relation Age of Onset  . Hypertension Mother   . Heart disease Mother   . Diabetes Mother   . Hypertension Father   . Colon cancer Neg Hx   . Esophageal cancer Neg Hx   . Stomach cancer Neg Hx   . Rectal cancer Neg Hx   . Colon polyps Neg Hx    Social History   Socioeconomic History  . Marital status: Widowed    Spouse name: Not on file  . Number of children: Not on file  . Years of education: Not on file  . Highest education level: Not on file  Occupational History  . Not on file  Tobacco Use  . Smoking status: Never Smoker  . Smokeless tobacco: Never  Used  Vaping Use  . Vaping Use: Never used  Substance and Sexual Activity  . Alcohol use: Not Currently  . Drug use: No  . Sexual activity: Not Currently    Partners: Male    Birth control/protection: Post-menopausal    Comment: 1st intercourse- 54, partners- 82,  married- 75 yrs   Other Topics Concern  . Not on file  Social History Narrative   Widowed in summer 2020   Former smoker no alcohol or drug use   Social Determinants of Radio broadcast assistant Strain:   . Difficulty of Paying Living Expenses:   Food Insecurity:   . Worried About Charity fundraiser in the Last Year:   . Arboriculturist in the Last Year:   Transportation Needs:   . Film/video editor (Medical):   Marland Kitchen Lack of Transportation (Non-Medical):   Physical Activity:   . Days of Exercise per Week:     . Minutes of Exercise per Session:   Stress:   . Feeling of Stress :   Social Connections:   . Frequency of Communication with Friends and Family:   . Frequency of Social Gatherings with Friends and Family:   . Attends Religious Services:   . Active Member of Clubs or Organizations:   . Attends Archivist Meetings:   Marland Kitchen Marital Status:    Past Surgical History:  Procedure Laterality Date  . CESAREAN SECTION     x 2  . COLONOSCOPY    . CRANIOTOMY Left 01/27/2020   Procedure: Left Craniotomy for Tumor Resection;  Surgeon: Judith Part, MD;  Location: [facility];  Service: Neurosurgery;  Laterality: Left;  . ENDOMETRIAL ABLATION    . ESOPHAGOGASTRODUODENOSCOPY  10/2019  . LAPAROSCOPY N/A 01/19/2020   Procedure: LAPAROSCOPY DIAGNOSTIC LAPAROTOMY WITH SMALL BOWEL RESECTION;  Surgeon: Ileana Roup, MD;  Location: [facility];  Service: General;  Laterality: N/A;  . PELVIC LAPAROSCOPY  1999   left salpingectomy, excision of Right, paratubal cyst  . PELVIC LAPAROSCOPY     laparoscopy with lysis of pelvic adhesions  . SHOULDER SURGERY  11/2010   "FROZEN SHOULDER"  . TOTAL HIP ARTHROPLASTY Left 08/17/2019   Procedure: LEFT TOTAL HIP ARTHROPLASTY ANTERIOR APPROACH;  Surgeon: Melrose Nakayama, MD;  Location: [facility];  Service: Orthopedics;  Laterality: Left;  . TUBAL LIGATION    . UPPER GASTROINTESTINAL ENDOSCOPY    . VENTRICULOSTOMY Left 01/27/2020   Procedure: VENTRICULOSTOMY;  Surgeon: Judith Part, MD;  Location: [facility];  Service: Neurosurgery;  Laterality: Left;   Past Medical History:  Diagnosis Date  . Anxiety   . Arthritis   . Diverticulosis   . GERD (gastroesophageal reflux disease)   . Hypertension   . Osteopenia   . Post-operative nausea and vomiting    vomiting after general anesthesia per pt.  . Pre-diabetes    BP (!) 150/92   Pulse 68   Temp 98.6 F (37 C) (Oral)   Ht 5\' 6"  (1.676 m)   Wt 124 lb (56.2 kg)   LMP 05/01/2009   SpO2 98%   BMI 20.01  kg/m   Opioid Risk Score:   Fall Risk Score:  `1  Depression screen PHQ 2/9  Depression screen [facility] 2/9 12/07/2019 09/20/2019 09/07/2019 08/09/2019 07/19/2019 07/02/2019 04/29/2019  Decreased Interest 3 1 1 1 2 1 1   Down, Depressed, Hopeless 3 2 0 0 2 1 0  PHQ - 2 Score 6 3 1 1 4 2  1  Altered sleeping 2 1 0 1 2 1 3   Tired, decreased energy 3 2 3 1 2  0 1  Change in appetite 2 2 1  0 0 0 1  Feeling bad or failure about yourself  2 0 0 0 0 0 0  Trouble concentrating 2 0 0 0 0 0 0  Moving slowly or fidgety/restless 2 3 0 0 2 0 0  Suicidal thoughts 1 0 0 0 0 0 0  PHQ-9 Score 20 11 5 3 10 3 6   Difficult doing work/chores Somewhat difficult Very difficult Not difficult at all Not difficult at all Somewhat difficult Somewhat difficult Somewhat difficult  Some recent data might be hidden   Review of Systems  Constitutional: Negative.   HENT: Negative.   Eyes: Negative.   Respiratory: Negative.   Cardiovascular: Negative.   Gastrointestinal: Negative.   Endocrine: Negative.   Genitourinary: Negative.   Musculoskeletal: Negative.   Skin: Negative.   Allergic/Immunologic: Negative.   Neurological: Positive for numbness.  Hematological: Negative.   Psychiatric/Behavioral: Negative.   All other systems reviewed and are negative.      Objective:   Physical Exam Gen: no distress, normal appearing HEENT: oral mucosa pink and moist, NCAT, sclera left eye very red.  Area is quite lubricated.  She is wearing her eye patch.  Extraocular eye movements are intact. Cardio: Reg rate Chest: normal effort, normal rate of breathing Abd: soft, non-distended Ext: no edema Skin: intact Neuro: left 7 nerve palsy with facial and brow droop and left lid lag--closed lid to 75%.  Visual acuity seems appropriate for objects distance and up close.  She ambulated with a slightly narrow based gait.  She tended to drift slightly to the left but this was minimal.  Romberg testing was minimally positive today.   Cognitively she showed reasonable insight and awareness concentration and short-term memory.  Strength is grossly 4-5 out of 5 in all 4 limbs.  No focal sensory findings noted.  Mild left hearing loss Musculoskeletal: No focal deficits.  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  She is making nice gains  -I believe once her visual issues are settled she can begin to drive. 2.  Pain: Really intermittent at best.  Continue Tylenol as needed             -May use tramadol for severe pain if necessary 4. Left VII nerve injury: Continue eye drops 5 X day and patch at night/when asleep. May use systane             -reviewed methods to protect left eye from irritation, swim goggle             - ophtho referral made to Dr. Gevena Cotton who she sees on July 22 5. ABLA: may use iron/B complex with mulit-vitamin to decrease some of the pills that she is taking daily 11. HTN:             fair control at present with metoprolol.  I will make no changes today 13. Left hip pain s/p THR 12/20:              Hip x-ray showing left hip arthroplasty  Controlled on 6/12 14. Small bowel RSXN:  May use bentyl prn for GI spasms or diarrhea  -miralax and colace are fine  as a daily regimen for now 15. Vestibular dysfunction: improving.   30 minutes of face to face patient care time were spent during this visit. All questions were encouraged and answered.  Follow up with me in 2 mos .

## 2020-03-01 NOTE — Patient Instructions (Signed)
° °  Working shorter hours in a position with limited multi-tasking requirements  When you return to work, rest before and after. A day you work is not a day to do an hours worth or grocery shopping or a lot of yard work. You will have to plan your days differently to conserve energy and accuracy of high level tasks (bill paying, planning a trip)  Think about taking more frequent breaks at work in a quiet place to rest your brain a couple of times during your shift.  Listen to your body - there is a line between pushing yourself to build endurance and pushing yourself to the point of debilitating exhaustion   Plan a trip - flight, hotel, foods, activities, budget, itinerary - 7 days (out of the state)  Maybe have softer foods at night  When your mouth is tired to help avoid biting your lip or cheek and to just make it easier

## 2020-03-01 NOTE — Therapy (Signed)
[facility] [address] [place], [place], [address] Phone: [phone]   Fax:  [phone]  Speech Language Pathology Treatment  Patient Details  Name: Cristina Conner MRN: [id number] Date of Birth: 24-Feb-1961 Referring Provider (SLP): Dr. Alger Simons   Encounter Date: 03/01/2020   End of Session - 03/01/20 1306    Visit Number 5    Number of Visits 17    Date for SLP Re-Evaluation 04/12/20    SLP Start Time 9024    SLP Stop Time  1058    SLP Time Calculation (min) 43 min    Activity Tolerance Patient tolerated treatment well           Past Medical History:  Diagnosis Date  . Anxiety   . Arthritis   . Diverticulosis   . GERD (gastroesophageal reflux disease)   . Hypertension   . Osteopenia   . Post-operative nausea and vomiting    vomiting after general anesthesia per pt.  . Pre-diabetes     Past Surgical History:  Procedure Laterality Date  . CESAREAN SECTION     x 2  . COLONOSCOPY    . CRANIOTOMY Left 01/27/2020   Procedure: Left Craniotomy for Tumor Resection;  Surgeon: Judith Part, MD;  Location: [facility];  Service: Neurosurgery;  Laterality: Left;  . ENDOMETRIAL ABLATION    . ESOPHAGOGASTRODUODENOSCOPY  10/2019  . LAPAROSCOPY N/A 01/19/2020   Procedure: LAPAROSCOPY DIAGNOSTIC LAPAROTOMY WITH SMALL BOWEL RESECTION;  Surgeon: Ileana Roup, MD;  [facility];  Service: General;  Laterality: N/A;  . PELVIC LAPAROSCOPY  1999   left salpingectomy, excision of Right, paratubal cyst  . PELVIC LAPAROSCOPY     laparoscopy with lysis of pelvic adhesions  . SHOULDER SURGERY  11/2010   "FROZEN SHOULDER"  . TOTAL HIP ARTHROPLASTY Left 08/17/2019   Procedure: LEFT TOTAL HIP ARTHROPLASTY ANTERIOR APPROACH;  Surgeon: Melrose Nakayama, MD;  [facility];  Service: Orthopedics;  Laterality: Left;  . TUBAL LIGATION    . UPPER GASTROINTESTINAL ENDOSCOPY    . VENTRICULOSTOMY Left 01/27/2020    Procedure: VENTRICULOSTOMY;  Surgeon: Judith Part, MD;  Location: [facility];  Service: Neurosurgery;  Laterality: Left;    There were no vitals filed for this visit.          ADULT SLP TREATMENT - 03/01/20 1023      General Information   Behavior/Cognition Alert;Cooperative;Pleasant mood      Treatment Provided   Treatment provided Cognitive-Linquistic      Cognitive-Linquistic Treatment   Treatment focused on Cognition;Patient/family/caregiver education    Skilled Treatment Cristina Conner independently wrote down questions for her doctor visit today and showed me.  Alternating attention betwen answering questions about the [employer] and challenges she may encounter after CVA upon return to work. Cristina Conner ID'd 3 potential difficulties she may encounter and accomodations/solutions with rare min A. She reports increased difficulty chewing in the evening due to fatigue. Instructed Cristina Conner to eat softer foods in the evening.       Assessment / Recommendations / Plan   Plan Continue with current plan of care      Dysphagia Recommendations   Diet recommendations Dysphagia 3 (mechanical soft);Thin liquid    Liquids provided via Cup;Straw    Medication Administration Whole meds with liquid    Supervision Patient able to self feed    Compensations Check for pocketing;Slow rate   chew on right side     Progression Toward Goals  Progression toward goals Progressing toward goals            SLP Education - 03/01/20 1058    Education Details compensations for attention at work and home    Person(s) Educated Patient    Methods Explanation;Demonstration;Handout            SLP Short Term Goals - 03/01/20 1306      SLP SHORT TERM GOAL #1   Title Pt will complete HEP for oral dysphagia/ CN VII palsy with mod I    Time 3    Period Weeks    Status On-going      SLP SHORT TERM GOAL #2   Title Pt will follow swallow precautions and diet recommendation with mod I    Time 3    Period Weeks      Status On-going      SLP SHORT TERM GOAL #3   Title Pt will demonstrate anticipatory awareness on high level cognitive linguistic tasks in therapy and at home with rare min A    Time 3    Period Weeks      SLP SHORT TERM GOAL #4   Title Pt will alternate attention on IADL tasks/high level congitive linguistic tasks with 95% accuracy on each with rare min A    Time 3            SLP Long Term Goals - 03/01/20 1306      SLP LONG TERM GOAL #1   Title Pt will utilize compensations for attention to successfully complete IADL's and high level congitive linguistic tasks with 90% accuracy and rare min A    Time 7    Period Weeks    Status On-going      SLP LONG TERM GOAL #2   Title Pt will generate 4 accomodations/compensations for attention to carryover in workplace with rare min A over 2 sessions    Time 7    Period Weeks    Status On-going      SLP LONG TERM GOAL #3   Title Pt will tolerate regular diet without avoiding less than 2 foods with rare min A over 2 sessions    Time 7    Period Weeks    Status On-going            Plan - 03/01/20 1305    Clinical Impression Statement [place] training in compensations for high level attention 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 Continue skilled ST    Speech Therapy Frequency 2x / week    Duration --   8 weeks or 17 visits   Treatment/Interventions Aspiration precaution training;Environmental controls;Cueing hierarchy;Oral motor exercises;SLP instruction and feedback;Compensatory strategies;Functional tasks;Cognitive reorganization;Compensatory techniques;Diet toleration management by SLP;Trials of upgraded texture/liquids;Internal/external aids;Multimodal communcation approach;Patient/family education    Potential to Achieve Goals Good           Patient will benefit from skilled therapeutic intervention in order to improve the following deficits  and impairments:   Cognitive communication deficit    Problem List Patient Active Problem List   Diagnosis Date Noted  . Cranial nerve VII palsy   . Hypoalbuminemia due to protein-calorie malnutrition ([facility])   . Acute blood loss anemia   . Sinus tachycardia   . Postoperative pain   . 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[facility]) 02/03/2020  . Protein-calorie malnutrition, severe 01/30/2020  . Brain tumor ([facility]) 01/27/2020  . Low folate 01/23/2020  .  Anemia of chronic disease 01/23/2020  . Small bowel obstruction from retained endoscopy capsule at stricture s/p ileal resection 01/19/2020 01/18/2020  . 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 Primary localized osteoarthritis of left hip 09-08-2019  . Death of family member-  husband passed Jul 2020 04/29/2019  . 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 Sleep difficulties 09/09/2018  . Pre-diabetes 03/09/2018  . Postmenopausal state- went thru early 40's 01/02/2018  . Primary osteoarthritis of left hip 01/02/2018  . Caregiver burden- for husband with ALLeukemia 01/02/2018  . Left hip pain 01/02/2018  . Osteopenia 07/31/2016  . Hypertension 05/20/2011  . Menopausal state 05/20/2011    Cristina Conner, Cristina Conner, Cristina Conner 03/01/2020, 1:07 PM  [facility] [address] [place], [place], [address] Phone: [phone]   Fax:  [phone]   Name: Cristina Conner MRN: [id number] Date of Birth: 05/23/1961

## 2020-03-01 NOTE — Patient Instructions (Addendum)
PLEASE FEEL FREE TO CALL OUR OFFICE WITH ANY PROBLEMS OR QUESTIONS ([phone])   Left Eye:  Try using swim goggle at least at night if not more during the day if you can tolerate that!

## 2020-03-01 NOTE — Therapy (Signed)
[facility] [address] [place], [place], [address] Phone: [phone]   Fax:  [phone]  Physical Therapy Treatment  Patient Details  Name: Cristina Conner MRN: [id number] Date of Birth: [birth year] Referring Provider (PT): Reesa Chew, [place]   Encounter Date: 02/29/2020   PT End of Session - 03/01/20 1349    Visit Number 6    Number of Visits 17    Date for PT Re-Evaluation 04/14/20    Authorization Type [employer] - [place]    PT Start Time [phone]    PT Stop Time 0930    PT Time Calculation (min) 44 min    Equipment Utilized During Treatment Gait belt    Activity Tolerance Patient tolerated treatment well    Behavior During Therapy [facility] for tasks assessed/performed           Past Medical History:  Diagnosis Date  . Anxiety   . Arthritis   . Diverticulosis   . GERD (gastroesophageal reflux disease)   . Hypertension   . Osteopenia   . Post-operative nausea and vomiting    vomiting after general anesthesia per pt.  . Pre-diabetes     Past Surgical History:  Procedure Laterality Date  . CESAREAN SECTION     x 2  . COLONOSCOPY    . CRANIOTOMY Left 01/27/2020   Procedure: Left Craniotomy for Tumor Resection;  Surgeon: Judith Part, MD;  Location: [facility];  Service: Neurosurgery;  Laterality: Left;  . ENDOMETRIAL ABLATION    . ESOPHAGOGASTRODUODENOSCOPY  10/2019  . LAPAROSCOPY N/A 01/19/2020   Procedure: LAPAROSCOPY DIAGNOSTIC LAPAROTOMY WITH SMALL BOWEL RESECTION;  Surgeon: Ileana Roup, MD;  Location[facility];  Service: General;  Laterality: N/A;  . PELVIC LAPAROSCOPY  1999   left salpingectomy, excision of Right, paratubal cyst  . PELVIC LAPAROSCOPY     laparoscopy with lysis of pelvic adhesions  . SHOULDER SURGERY  11/2010   "FROZEN SHOULDER"  . TOTAL HIP ARTHROPLASTY Left 08/17/2019   Procedure: LEFT TOTAL HIP ARTHROPLASTY ANTERIOR APPROACH;  Surgeon: Melrose Nakayama, MD;  Location[facility];   Service: Orthopedics;  Laterality: Left;  . TUBAL LIGATION    . UPPER GASTROINTESTINAL ENDOSCOPY    . VENTRICULOSTOMY Left 01/27/2020   Procedure: VENTRICULOSTOMY;  Surgeon: Judith Part, MD;  Location: [facility];  Service: Neurosurgery;  Laterality: Left;    There were no vitals filed for this visit.   Subjective Assessment - 02/29/20 0855    Subjective Pt states she is doing better - balance continues to slowly improve    Currently in Pain? Yes    Pain Score 2     Pain Location Eye    Pain Orientation Left    Pain Descriptors / Indicators Aching    Pain Type Surgical pain                             [facility] Adult PT Treatment/Exercise - 03/01/20 0001      Ambulation/Gait   Ambulation/Gait Yes    Ambulation/Gait Assistance 5: Supervision    Ambulation Distance (Feet) 250 Feet    Assistive device None    Gait Pattern Step-through pattern    Ambulation Surface Level;Indoor      Exercises   Exercises Knee/Hip      Knee/Hip Exercises: Standing   Heel Raises Both;1 set;10 reps   LLE only - heel raises 10 reps with UE support  Hip Flexion Stengthening;Left;1 set;10 reps;Knee bent;Knee straight   3# weight used    Hip Abduction Stengthening;Left;1 set;10 reps;Knee straight   3# weight   Hip Extension Stengthening;Left;1 set;10 reps;Knee straight   3# weight   Forward Step Up Left;1 set;10 reps;Hand Hold: 1    Step Down Left;1 set;10 reps;Hand Hold: 2;Step Height: 6"               Balance Exercises - 03/01/20 0001      Balance Exercises: Standing   Rockerboard Anterior/posterior;EO;10 reps;Intermittent UE support    Other Standing Exercises tap ups to 1st step 10 reps each , then to 2nd step 10 reps each with UE support prn with CGA    Other Standing Exercises Comments Pt performed stepping over & back of balance beam 10 reps each leg without UE support           Neuro Re-ed; pt performed SLS and coordination ex. - foot on medium sized blue ball  - rolled ball forward/back and then side to side 10 reps each leg each direction  Squats standing on blue Airex - EO - 10 reps;  EC 10 reps with CGA to min assist      PT Short Term Goals - 03/01/20 1354      PT SHORT TERM GOAL #1   Title Improve Berg balance score by at least 4 points for reduced fall risk.    Baseline BERG assessed on 02/17/20, scored 50/56.    Time 4    Period Weeks    Status Revised    Target Date 03/17/20      PT SHORT TERM GOAL #2   Title Pt will improve TUG score from 14.57 secs to </= 12 secs without use of SPC for reduced fall risk.    Baseline 14.57 secs - pt carried cane    Time 4    Period Weeks    Status New    Target Date 03/17/20      PT SHORT TERM GOAL #3   Title Pt will ambulate 500' without use of SPC on flat even surfaces with SBA for incr. community accessibility.    Baseline pt using cane - 100' at eval    Time 4    Period Weeks    Status New    Target Date 03/17/20      PT SHORT TERM GOAL #4   Title Pt will amb. 57' with horizontal head turns without device with supervision for safety with environmental scanning.    Time 4    Period Weeks    Status New    Target Date 03/17/20      PT SHORT TERM GOAL #5   Title Pt will participate in aquatic therapy after permission obtained from MD (craniotomy incision healed).    Time 4    Period Weeks    Status New    Target Date 03/17/20      PT SHORT TERM GOAL #6   Title Independent in balance HEP.    Time 4    Period Weeks    Status New    Target Date 03/17/20      PT SHORT TERM GOAL #7   Title Perform SOT & establish goal as appropriate.    Time 4    Period Weeks    Status New    Target Date 04/21/20             PT Long Term Goals - 03/01/20 1354  PT LONG TERM GOAL #1   Title Pt will improve FGA score by at least 8 points from baseline score for incr. safety with amb.    Baseline TBA    Time 8    Period Weeks    Status New      PT LONG TERM GOAL #2   Title Pt  will improve Berg balance test by at least 10 points from baseline for reduced fall risk.    Time 8    Period Weeks    Status New      PT LONG TERM GOAL #3   Title Pt will amb. 1000' on flat even/uneven srufaces without device with supervision.    Time 8    Period Weeks    Status New      PT LONG TERM GOAL #4   Title Pt will be independent in updated HEP including aquatic exercises at time of D/C from PT.    Time 8    Period Weeks    Status New      PT LONG TERM GOAL #5   Title Pt will amb. 70' with head turns with no c/o dizziness and no imbalance for incr. safety with environmental scanning.    Time 8    Period Weeks    Status New                 Plan - 03/01/20 1350    Clinical Impression Statement Pt has weakness in LLE, especially with eccentric contraction of Lt quad.  Pt also noted to have Lt hip musculature weakness with pt reporting 3# weight felt very heavy.  No dizziness was reported with any activities, only postural instability resulting in mild LOB with need for intermittent UE support with balance exercises.  Decreased vision (Lt eye is patched) contributes to balance deficits.    Personal Factors and Comorbidities Comorbidity 2;Transportation;Behavior Pattern;Comorbidity 3+    Comorbidities cranial nerve VII palsy, craniotomy on 01-27-20 for tumor resection, unilateral vestibular schwannoma, anxiety,, s/p Lt THR Dec. 2020    Examination-Activity Limitations Transfers;Locomotion Level;Squat;Stairs;Stand;Carry    Examination-Participation Restrictions Meal Prep;Cleaning;Community Activity;Driving;Shop;Laundry;Interpersonal Relationship    Stability/Clinical Decision Making Evolving/Moderate complexity    Rehab Potential Good    PT Frequency 2x / week    PT Duration 8 weeks    PT Treatment/Interventions Aquatic Therapy;Gait training;Stair training;Therapeutic activities;Therapeutic exercise;Balance training;Neuromuscular re-education;Patient/family  education;Vestibular    PT Next Visit Plan Cont balance exs- SLS and LLE strengthening - hip and quad esp.  Continue functional BLE strengthening, standing balance on compliant surfaces with head turns, eyes closed. tandem balance. perform SOT when appropriate.    Consulted and Agree with Plan of Care Patient;Family member/caregiver    Family Member Consulted [name]           Patient will benefit from skilled therapeutic intervention in order to improve the following deficits and impairments:  Abnormal gait, Decreased balance, Decreased activity tolerance, Decreased coordination, Dizziness, Impaired vision/preception  Visit Diagnosis: Other abnormalities of gait and mobility  Muscle weakness (generalized)  Unsteadiness on feet     Problem List Patient Active Problem List   Diagnosis Date Noted  . Cranial nerve VII palsy   . Hypoalbuminemia due to protein-calorie malnutrition ([place])   . Acute blood loss anemia   . Sinus tachycardia   . Postoperative pain   . 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 Protein-calorie malnutrition, severe 01/30/2020  . Brain tumor ([place]) 01/27/2020  . Low folate 01/23/2020  . Anemia  of chronic disease 01/23/2020  . Small bowel obstruction from retained endoscopy capsule at stricture s/p ileal resection 01/19/2020 01/18/2020  . 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 Primary localized osteoarthritis of left hip 07-Sep-2019  . Death of family member-  husband passed Jul 2020 04/29/2019  . 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 Sleep difficulties 09/09/2018  . Pre-diabetes 03/09/2018  . Postmenopausal state- went thru early 40's 01/02/2018  . Primary osteoarthritis of left hip 01/02/2018  . Caregiver burden- for husband with ALLeukemia 01/02/2018  . Left hip pain 01/02/2018  . Osteopenia 07/31/2016  . Hypertension 05/20/2011  . Menopausal state 05/20/2011    [place],  PT 03/01/2020, 1:55 PM  [facility] [address] [place], [place], [address] Phone: [phone]   Fax:  [phone]  Name: Cristina Conner MRN: [id number] Date of Birth: 02-22-1961

## 2020-03-03 ENCOUNTER — Ambulatory Visit: Payer: BC Managed Care – PPO | Attending: Physical Medicine and Rehabilitation

## 2020-03-03 ENCOUNTER — Other Ambulatory Visit: Payer: Self-pay

## 2020-03-03 DIAGNOSIS — M6281 Muscle weakness (generalized): Secondary | ICD-10-CM | POA: Diagnosis present

## 2020-03-03 DIAGNOSIS — R41841 Cognitive communication deficit: Secondary | ICD-10-CM | POA: Insufficient documentation

## 2020-03-03 DIAGNOSIS — R278 Other lack of coordination: Secondary | ICD-10-CM | POA: Insufficient documentation

## 2020-03-03 DIAGNOSIS — R1311 Dysphagia, oral phase: Secondary | ICD-10-CM | POA: Diagnosis present

## 2020-03-03 DIAGNOSIS — R41844 Frontal lobe and executive function deficit: Secondary | ICD-10-CM | POA: Insufficient documentation

## 2020-03-03 DIAGNOSIS — R2689 Other abnormalities of gait and mobility: Secondary | ICD-10-CM | POA: Insufficient documentation

## 2020-03-03 DIAGNOSIS — R2681 Unsteadiness on feet: Secondary | ICD-10-CM | POA: Diagnosis present

## 2020-03-03 DIAGNOSIS — R42 Dizziness and giddiness: Secondary | ICD-10-CM | POA: Insufficient documentation

## 2020-03-03 DIAGNOSIS — R41840 Attention and concentration deficit: Secondary | ICD-10-CM | POA: Insufficient documentation

## 2020-03-03 NOTE — Therapy (Signed)
[facility] [address] [place], [place], [address] Phone: [phone]   Fax:  [phone]  Speech Language Pathology Treatment  Patient Details  Name: Cristina Conner MRN: [id number] Date of Birth: Jul 26, 1961 Referring Provider (SLP): Dr. Alger Simons   Encounter Date: 03/03/2020   End of Session - 03/03/20 1017    Visit Number 6    Number of Visits 17    Date for SLP Re-Evaluation 04/12/20    SLP Start Time 0850    SLP Stop Time  0930    SLP Time Calculation (min) 40 min    Activity Tolerance Patient tolerated treatment well           Past Medical History:  Diagnosis Date  . Anxiety   . Arthritis   . Diverticulosis   . GERD (gastroesophageal reflux disease)   . Hypertension   . Osteopenia   . Post-operative nausea and vomiting    vomiting after general anesthesia per pt.  . Pre-diabetes     Past Surgical History:  Procedure Laterality Date  . CESAREAN SECTION     x 2  . COLONOSCOPY    . CRANIOTOMY Left 01/27/2020   Procedure: Left Craniotomy for Tumor Resection;  Surgeon: Judith Part, MD;  Location: [facility];  Service: Neurosurgery;  Laterality: Left;  . ENDOMETRIAL ABLATION    . ESOPHAGOGASTRODUODENOSCOPY  10/2019  . LAPAROSCOPY N/A 01/19/2020   Procedure: LAPAROSCOPY DIAGNOSTIC LAPAROTOMY WITH SMALL BOWEL RESECTION;  Surgeon: Ileana Roup, MD;  [facility];  Service: General;  Laterality: N/A;  . PELVIC LAPAROSCOPY  1999   left salpingectomy, excision of Right, paratubal cyst  . PELVIC LAPAROSCOPY     laparoscopy with lysis of pelvic adhesions  . SHOULDER SURGERY  11/2010   "FROZEN SHOULDER"  . TOTAL HIP ARTHROPLASTY Left 08/17/2019   Procedure: LEFT TOTAL HIP ARTHROPLASTY ANTERIOR APPROACH;  Surgeon: Melrose Nakayama, MD;  [facility];  Service: Orthopedics;  Laterality: Left;  . TUBAL LIGATION    . UPPER GASTROINTESTINAL ENDOSCOPY    . VENTRICULOSTOMY Left 01/27/2020    Procedure: VENTRICULOSTOMY;  Surgeon: Judith Part, MD;  Location: [facility];  Service: Neurosurgery;  Laterality: Left;    There were no vitals filed for this visit.   Subjective Assessment - 03/03/20 0905    Subjective "I have an appointment with a  - - neuro-opthamaologist? On July 22nd."    Currently in Pain? No/denies                 ADULT SLP TREATMENT - 03/03/20 0911      General Information   Behavior/Cognition Alert;Cooperative;Pleasant mood      Treatment Provided   Treatment provided Cognitive-Linquistic      Cognitive-Linquistic Treatment   Treatment focused on Cognition;Patient/family/caregiver education    Skilled Treatment Pt told SLP about her MD appt yesterday - pt got all questions answered, without assistance. MD Naaman Plummer) told pt she could d/c separate vitamins if in her multivitamin, so pt did so. Due to this, SLP directed pt through cleaning up her med list with SBA from SLP. Pt told SLP correctly what her daily meds are, prior to SLP cleaning up med list with pt. Pt told SLP she was supposed to tell SLP about her assignment to plan a trip and independently went over preliminary ideas with SLP - pt did not have anything noted for food so SLP provided min cues about this and pt ID'd this aspect and  wrote a note to remember for later. Pt taking notes during session re: her "trip" assignment.        Assessment / Recommendations / Plan   Plan Continue with current plan of care      Progression Toward Goals   Progression toward goals Progressing toward goals              SLP Short Term Goals - 03/03/20 1018      SLP SHORT TERM GOAL #1   Title Pt will complete HEP for oral dysphagia/ CN VII palsy with mod I    Time 3    Period Weeks    Status On-going      SLP SHORT TERM GOAL #2   Title Pt will follow swallow precautions and diet recommendation with mod I    Time 3    Period Weeks    Status On-going      SLP SHORT TERM GOAL #3   Title Pt will  demonstrate anticipatory awareness on high level cognitive linguistic tasks in therapy and at home with rare min A    Time 3    Period Weeks      SLP SHORT TERM GOAL #4   Title Pt will alternate attention on IADL tasks/high level congitive linguistic tasks with 95% accuracy on each with rare min A    Time 3            SLP Long Term Goals - 03/03/20 1019      SLP LONG TERM GOAL #1   Title Pt will utilize compensations for attention to successfully complete IADL's and high level congitive linguistic tasks with 90% accuracy and rare min A    Time 7    Period Weeks    Status On-going      SLP LONG TERM GOAL #2   Title Pt will generate 4 accomodations/compensations for attention to carryover in workplace with rare min A over 2 sessions    Time 7    Period Weeks    Status On-going      SLP LONG TERM GOAL #3   Title Pt will tolerate regular diet without avoiding less than 2 foods with rare min A over 2 sessions    Time 7    Period Weeks    Status On-going            Plan - 03/03/20 1018    Clinical Impression Statement [place] training in compensations for high level attention 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 Continue skilled ST    Speech Therapy Frequency 2x / week    Duration --   8 weeks or 17 visits   Treatment/Interventions Aspiration precaution training;Environmental controls;Cueing hierarchy;Oral motor exercises;SLP instruction and feedback;Compensatory strategies;Functional tasks;Cognitive reorganization;Compensatory techniques;Diet toleration management by SLP;Trials of upgraded texture/liquids;Internal/external aids;Multimodal communcation approach;Patient/family education    Potential to Achieve Goals Good           Patient will benefit from skilled therapeutic intervention in order to improve the following deficits and impairments:   Cognitive communication deficit  Dysphagia,  oral phase    Problem List Patient Active Problem List   Diagnosis Date Noted  . Cranial nerve VII palsy   . Hypoalbuminemia due to protein-calorie malnutrition ([place])   . Acute blood loss anemia   . Sinus tachycardia   . Postoperative pain   . 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 Protein-calorie malnutrition, severe  01/30/2020  . Brain tumor ([place]) 01/27/2020  . Low folate 01/23/2020  . Anemia of chronic disease 01/23/2020  . Small bowel obstruction from retained endoscopy capsule at stricture s/p ileal resection 01/19/2020 01/18/2020  . 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 Primary localized osteoarthritis of left hip 02-Sep-2019  . Death of family member-  husband passed Jul 2020 04/29/2019  . 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 Sleep difficulties 09/09/2018  . Pre-diabetes 03/09/2018  . Postmenopausal state- went thru early 40's 01/02/2018  . Primary osteoarthritis of left hip 01/02/2018  . Caregiver burden- for husband with ALLeukemia 01/02/2018  . Left hip pain 01/02/2018  . Osteopenia 07/31/2016  . Hypertension 05/20/2011  . Menopausal state 05/20/2011    [facility] ,[place], [place]  03/03/2020, 10:20 AM  [facility] [address] [place], [place], [address] Phone: [phone]   Fax:  [phone]   Name: Cristina Conner MRN: [id number] Date of Birth: Jun 30, 1961

## 2020-03-03 NOTE — Patient Instructions (Signed)
Finish your trip to [facility]! You have a great start!

## 2020-03-07 ENCOUNTER — Encounter: Payer: Self-pay | Admitting: Occupational Therapy

## 2020-03-07 ENCOUNTER — Other Ambulatory Visit: Payer: Self-pay

## 2020-03-07 ENCOUNTER — Ambulatory Visit: Payer: BC Managed Care – PPO

## 2020-03-07 ENCOUNTER — Ambulatory Visit: Payer: BC Managed Care – PPO | Admitting: Occupational Therapy

## 2020-03-07 ENCOUNTER — Encounter: Payer: Self-pay | Admitting: Physical Therapy

## 2020-03-07 ENCOUNTER — Ambulatory Visit: Payer: BC Managed Care – PPO | Admitting: Physical Therapy

## 2020-03-07 DIAGNOSIS — R278 Other lack of coordination: Secondary | ICD-10-CM

## 2020-03-07 DIAGNOSIS — R41844 Frontal lobe and executive function deficit: Secondary | ICD-10-CM

## 2020-03-07 DIAGNOSIS — R2681 Unsteadiness on feet: Secondary | ICD-10-CM

## 2020-03-07 DIAGNOSIS — R2689 Other abnormalities of gait and mobility: Secondary | ICD-10-CM

## 2020-03-07 DIAGNOSIS — R41841 Cognitive communication deficit: Secondary | ICD-10-CM

## 2020-03-07 DIAGNOSIS — M6281 Muscle weakness (generalized): Secondary | ICD-10-CM

## 2020-03-07 DIAGNOSIS — R1311 Dysphagia, oral phase: Secondary | ICD-10-CM

## 2020-03-07 DIAGNOSIS — R41840 Attention and concentration deficit: Secondary | ICD-10-CM

## 2020-03-07 NOTE — Patient Instructions (Addendum)
     Typing:  Left-handed Words  1. wear 2. were 3. are 4. bear 5. bare 6. care 7. dear 8. deer 9. stare 10. react 11. dare  12. sad 13. gear 14. great 15. rate  16. date 17. red 18. read 19. fear 20. free 21. tea 22. tear 23. fast 24. waste 25. treat 26. tease 27. zest 28. wax 29. cast 30. vase 31. vast 32. cat 33. basket 34. earn 35. casket 36. bee 37. seat 38. ate 39. gas 40. rare 41. seed 42. tax 43. fax 44. feed 45. cart 46. art 47. tart 48. taste 49. ear 50. here 

## 2020-03-07 NOTE — Patient Instructions (Addendum)
   Movements are small and gradual, in a mirror - do these once or twice a day  Do at least 15 "Elvis" mouth movements (bring one corner of your mouth back towards your ear)  At least 15 reps with a WIDE smile, slowly, focusing on that left side  At least 15 reps with a SMALL pucker, slowly, focusing on moving the left side    ==========================================  Please complete the assigned speech therapy homework prior to your next session and return it to the speech therapist at your next visit.

## 2020-03-07 NOTE — Therapy (Signed)
[place] [address] [place], [place], [address] Phone: [phone]   Fax:  [phone]  Occupational Therapy Treatment  Patient Details  Name: Cristina Conner MRN: [id number] Date of Birth: 11-07-1960 Referring Provider (OT): Dr. Alger Simons   Encounter Date: 03/07/2020   OT End of Session - 03/07/20 1215    Visit Number 3    Number of Visits 25    Date for OT Re-Evaluation 05/15/20    Authorization Type [employer] covered 100%, no visit limit    OT Start Time 1108    OT Stop Time 1155    OT Time Calculation (min) 47 min    Activity Tolerance Patient tolerated treatment well    Behavior During Therapy [facility] for tasks assessed/performed           Past Medical History:  Diagnosis Date  . Anxiety   . Arthritis   . Diverticulosis   . GERD (gastroesophageal reflux disease)   . Hypertension   . Osteopenia   . Post-operative nausea and vomiting    vomiting after general anesthesia per pt.  . Pre-diabetes     Past Surgical History:  Procedure Laterality Date  . CESAREAN SECTION     x 2  . COLONOSCOPY    . CRANIOTOMY Left 01/27/2020   Procedure: Left Craniotomy for Tumor Resection;  Surgeon: Judith Part, MD;  Location: [place];  Service: Neurosurgery;  Laterality: Left;  . ENDOMETRIAL ABLATION    . ESOPHAGOGASTRODUODENOSCOPY  10/2019  . LAPAROSCOPY N/A 01/19/2020   Procedure: LAPAROSCOPY DIAGNOSTIC LAPAROTOMY WITH SMALL BOWEL RESECTION;  Surgeon: Ileana Roup, MD;  [facility];  Service: General;  Laterality: N/A;  . PELVIC LAPAROSCOPY  1999   left salpingectomy, excision of Right, paratubal cyst  . PELVIC LAPAROSCOPY     laparoscopy with lysis of pelvic adhesions  . SHOULDER SURGERY  11/2010   "FROZEN SHOULDER"  . TOTAL HIP ARTHROPLASTY Left 08/17/2019   Procedure: LEFT TOTAL HIP ARTHROPLASTY ANTERIOR APPROACH;  Surgeon: Melrose Nakayama, MD;  Location[facility];  Service: Orthopedics;   Laterality: Left;  . TUBAL LIGATION    . UPPER GASTROINTESTINAL ENDOSCOPY    . VENTRICULOSTOMY Left 01/27/2020   Procedure: VENTRICULOSTOMY;  Surgeon: Judith Part, MD;  Location: [place];  Service: Neurosurgery;  Laterality: Left;    There were no vitals filed for this visit.   Subjective Assessment - 03/07/20 1100    Subjective  I worked on picking up things and typed my homework for speech.  Pt scheduled to see neuro-ophthalmologist July 22.  No pain/pressure only.    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 decr hearing L side    Limitations fall risk, L eye patched due to inability to close eye    Patient Stated Goals wants to be able to drive, improve balance, perform financial management, be able to take care of mother    Currently in Pain? No/denies               Completing 48-piece puzzle (for incr attention/visual scanning/problem solving) with min cueing initially for strategy (seperating and completing edge pieces first), then min cueing for attention/re-direction and incr time needed.  Discussed progress with  IADLs, recent visit with Dr. Naaman Plummer, HEP performance, and vision.  L eye continues to be red with occasional soreness--pt reports that Dr. Naaman Plummer told her to keep it covered as much as possible and try swim goggles at night.         OT Education - 03/07/20 1111    Education Details L-handed typing words,  Close/open both eyes gently x10 when she uncovers eye to put in eye drops, try gentle massage/desensitization on scar tissue due to reports of sensitivity when laying down    Person(s) Educated Patient     Methods Explanation;Handout    Comprehension Verbalized understanding            OT Short Term Goals - 03/07/20 1141      OT SHORT TERM GOAL #1   Title Pt will be independent with initial HEP for LUE coordination and strength.--check STGs 03/31/20    Time 6    Period Weeks    Status New    Target Date 03/31/20      OT SHORT TERM GOAL #2   Title Pt will improve L hand coordination for ADLs as shown by improving time on 9-hole peg test by at least 12sec.    Baseline 40.40sec    Time 6    Period Weeks    Status New      OT SHORT TERM GOAL #3   Title Pt will be able to perform tabletop scanning with at least 95% accuracy for incr attention and IADL tasks.    Time 6    Period Weeks    Status New      OT SHORT TERM GOAL #4   Title Pt will perform simple home maintenance tasks mod I.    Time 6    Period Weeks    Status Achieved   03/07/20:  sweeping, laundry, dishes, wiping counters     OT SHORT TERM GOAL #5   Title Pt will perform simple financial management tasks with min A.    Time 6    Period Weeks    Status New             OT Long Term Goals - 02/15/20 1532      OT LONG TERM GOAL #1   Title Pt will be independent with updated HEP.--check LTGs 05/15/20    Time 12    Period Weeks    Status New    Target Date 05/15/20      OT LONG TERM GOAL #2   Title Pt will improve L grip strength by at least 8lbs to assist with home maintenance tasks/in prep for yardwork.    Time 12    Period Weeks    Status New      OT LONG TERM GOAL #3   Title Pt will demo improved balance and strength to be able to water plants.    Time 12    Period Weeks    Status New      OT LONG TERM GOAL #4   Title Pt will perform environmental scanning with at least 90% accuracy for incr safety for community activities.    Time 12    Period Weeks    Status New      OT LONG TERM GOAL #5   Title Pt will perform previous financial management tasks with supervision.    Time 12    Period Weeks     Status New      Long Term Additional Goals  Additional Long Term Goals Yes      OT LONG TERM GOAL #6   Title Pt will be able to alternate attention between at least 1 physical and 1 cognitive task with at least 90% accuracy for incr safety with IADLs.    Time 12    Period Weeks    Status New                 Plan - 03/07/20 1135    Clinical Impression Statement Pt is progressing towards goals with improving visual scanning and attention and reports improved functional coordination and increased IADL performance.    OT Occupational Profile and History Detailed Assessment- Review of Records and additional review of physical, cognitive, psychosocial history related to current functional performance    Occupational performance deficits (Please refer to evaluation for details): ADL's;IADL's;Work;Leisure;Other    Body Structure / Function / Physical Skills ADL;Hearing;Vision;IADL;Balance;Mobility;Strength;Coordination;FMC;UE functional use    Cognitive Skills Attention;Memory;Perception;Thought    Rehab Potential Good    Clinical Decision Making Several treatment options, min-mod task modification necessary    Comorbidities Affecting Occupational Performance: May have comorbidities impacting occupational performance    Modification or Assistance to Complete Evaluation  Min-Moderate modification of tasks or assist with assess necessary to complete eval    OT Frequency 2x / week    OT Duration 12 weeks   +eval   OT Treatment/Interventions Self-care/ADL training;DME and/or AE instruction;Therapeutic activities;Aquatic Therapy;Therapeutic exercise;Cognitive remediation/compensation;Visual/perceptual remediation/compensation;Functional Mobility Training;Neuromuscular education;Patient/family education    Plan attention,environmental scanning, simple financial management    Consulted and Agree with Plan of Care Patient;Family member/caregiver    Family Member Consulted dtr            Patient will benefit from skilled therapeutic intervention in order to improve the following deficits and impairments:   Body Structure / Function / Physical Skills: ADL, Hearing, Vision, IADL, Balance, Mobility, Strength, Coordination, FMC, UE functional use Cognitive Skills: Attention, Memory, Perception, Thought     Visit Diagnosis: Other lack of coordination  Attention and concentration deficit  Frontal lobe and executive function deficit  Unsteadiness on feet    Problem List Patient Active Problem List   Diagnosis Date Noted  . Cranial nerve VII palsy   . Hypoalbuminemia due to protein-calorie malnutrition ([place])   . Acute blood loss anemia   . Sinus tachycardia   . Postoperative pain   . 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[place]) 02/03/2020  . Protein-calorie malnutrition, severe 01/30/2020  . Brain tumor ([place]) 01/27/2020  . Low folate 01/23/2020  . Anemia of chronic disease 01/23/2020  . Small bowel obstruction from retained endoscopy capsule at stricture s/p ileal resection 01/19/2020 01/18/2020  . 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 Primary localized osteoarthritis of left hip 25-Aug-2019  . Death of family member-  husband passed Jul 2020 04/29/2019  . 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 Sleep difficulties 09/09/2018  . Pre-diabetes 03/09/2018  . Postmenopausal state- went thru early 40's 01/02/2018  . Primary osteoarthritis of left hip 01/02/2018  . Caregiver burden- for husband with ALLeukemia 01/02/2018  . Left hip pain 01/02/2018  . Osteopenia 07/31/2016  . Hypertension 05/20/2011  . Menopausal state 05/20/2011    [facility] 03/07/2020, 12:16 PM  [place] [address] [place], [place], [address] Phone: [phone]   Fax:  [phone]  Name: Cristina Conner MRN: [id number] Date of Birth: May 24, 1961   Vianne Bulls, OTR/L [facility]  [place], [place]  [address] [phone] phone [phone] 03/07/20 12:21 PM

## 2020-03-07 NOTE — Therapy (Signed)
[place] [address] [place], [place], [address] Phone: [phone]   Fax:  [phone]  Speech Language Pathology Treatment  Patient Details  Name: Cristina Conner MRN: [id number] Date of Birth: 04-May-1961 Referring Provider (SLP): Dr. Alger Simons   Encounter Date: 03/07/2020   End of Session - 03/07/20 1155    Visit Number 7    Number of Visits 17    Date for SLP Re-Evaluation 04/12/20    SLP Start Time 0850    SLP Stop Time  0930    SLP Time Calculation (min) 40 min    Activity Tolerance Patient tolerated treatment well           Past Medical History:  Diagnosis Date  . Anxiety   . Arthritis   . Diverticulosis   . GERD (gastroesophageal reflux disease)   . Hypertension   . Osteopenia   . Post-operative nausea and vomiting    vomiting after general anesthesia per pt.  . Pre-diabetes     Past Surgical History:  Procedure Laterality Date  . CESAREAN SECTION     x 2  . COLONOSCOPY    . CRANIOTOMY Left 01/27/2020   Procedure: Left Craniotomy for Tumor Resection;  Surgeon: Judith Part, MD;  Location: [facility];  Service: Neurosurgery;  Laterality: Left;  . ENDOMETRIAL ABLATION    . ESOPHAGOGASTRODUODENOSCOPY  10/2019  . LAPAROSCOPY N/A 01/19/2020   Procedure: LAPAROSCOPY DIAGNOSTIC LAPAROTOMY WITH SMALL BOWEL RESECTION;  Surgeon: Ileana Roup, MD;  [facility];  Service: General;  Laterality: N/A;  . PELVIC LAPAROSCOPY  1999   left salpingectomy, excision of Right, paratubal cyst  . PELVIC LAPAROSCOPY     laparoscopy with lysis of pelvic adhesions  . SHOULDER SURGERY  11/2010   "FROZEN SHOULDER"  . TOTAL HIP ARTHROPLASTY Left 08/17/2019   Procedure: LEFT TOTAL HIP ARTHROPLASTY ANTERIOR APPROACH;  Surgeon: Melrose Nakayama, MD;  [facility];  Service: Orthopedics;  Laterality: Left;  . TUBAL LIGATION    . UPPER GASTROINTESTINAL ENDOSCOPY    . VENTRICULOSTOMY Left 01/27/2020    Procedure: VENTRICULOSTOMY;  Surgeon: Judith Part, MD;  Location: [place];  Service: Neurosurgery;  Laterality: Left;    There were no vitals filed for this visit.   Subjective Assessment - 03/07/20 0854    Subjective "OT wanted me to practice typing so I did two things at once." (typed out her trip details)    Currently in Pain? Yes    Pain Score 3     Pain Location --   forehead   Pain Orientation Left    Pain Descriptors / Indicators Aching    Pain Type Acute pain    Pain Onset Today    Pain Frequency Constant                 ADULT SLP TREATMENT - 03/07/20 0858      General Information   Behavior/Cognition Alert;Cooperative;Pleasant mood      Treatment Provided   Treatment provided Cognitive-Linquistic      Cognitive-Linquistic Treatment   Treatment focused on Cognition;Patient/family/caregiver education   swallowing   Skilled Treatment (cognition: 15 minutes) SLP reviewed pt's homework with her - attention to detail - pt forgot to add $ for [place] to airport and to figure time to leave for airport in [place], but approximated correctly when asked by SLP. (Dysphagia, ind 30 minutes) "You might not, but does speech handle this? (pt points to her lt facial  droop). SLP developed HEP for pt's labial/facial musculature, looking at SLPs notes/pt instructions from the evaluation as a guide.       Assessment / Recommendations / Plan   Plan Continue with current plan of care      Dysphagia Recommendations   Diet recommendations Dysphagia 3 (mechanical soft);Thin liquid    Liquids provided via Cup;Straw    Medication Administration Whole meds with liquid    Supervision Patient able to self feed    Compensations Check for pocketing;Slow rate      Progression Toward Goals   Progression toward goals Progressing toward goals            SLP Education - 03/07/20 1147    Education Details HEP for lt labial/facial weakness    Person(s) Educated Patient    Methods  Explanation;Demonstration;Verbal cues;Handout    Comprehension Verbalized understanding;Returned demonstration;Need further instruction            SLP Short Term Goals - 03/07/20 3295      SLP SHORT TERM GOAL #1   Title Pt will complete HEP for oral dysphagia/ CN VII palsy with mod I    Time 2    Period Weeks    Status On-going      SLP SHORT TERM GOAL #2   Title Pt will follow swallow precautions and diet recommendation with mod I    Time 2    Period Weeks    Status On-going      SLP SHORT TERM GOAL #3   Title Pt will demonstrate anticipatory awareness on high level cognitive linguistic tasks in therapy and at home with rare min A    Time 2    Period Weeks      SLP SHORT TERM GOAL #4   Title Pt will alternate attention on IADL tasks/high level congitive linguistic tasks with 95% accuracy on each with rare min A    Time 2    Status Achieved            SLP Long Term Goals - 03/07/20 1335      SLP LONG TERM GOAL #1   Title Pt will utilize compensations for attention to successfully complete IADL's and high level congitive linguistic tasks with 90% accuracy and rare min A    Time 6    Period Weeks    Status On-going      SLP LONG TERM GOAL #2   Title Pt will generate 4 accomodations/compensations for attention to carryover in workplace with rare min A over 2 sessions    Time 6    Period Weeks    Status On-going      SLP LONG TERM GOAL #3   Title Pt will tolerate regular diet without avoiding less than 2 foods with rare min A over 2 sessions    Time 6    Period Weeks    Status On-going            Plan - 03/07/20 1156    Clinical Impression Statement [place] training in compensations for high level attention for 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 Continue skilled ST.    Speech Therapy Frequency 2x / week    Duration --   8 weeks or 17 visits   Treatment/Interventions Aspiration precaution  training;Environmental controls;Cueing hierarchy;Oral motor exercises;SLP instruction and feedback;Compensatory strategies;Functional tasks;Cognitive reorganization;Compensatory techniques;Diet toleration management by SLP;Trials of upgraded texture/liquids;Internal/external aids;Multimodal communcation approach;Patient/family education  Potential to Achieve Goals Good           Patient will benefit from skilled therapeutic intervention in order to improve the following deficits and impairments:   Cognitive communication deficit  Dysphagia, oral phase    Problem List Patient Active Problem List   Diagnosis Date Noted  . Cranial nerve VII palsy   . Hypoalbuminemia due to protein-calorie malnutrition ([place])   . Acute blood loss anemia   . Sinus tachycardia   . Postoperative pain   . 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[place]) 02/03/2020  . Protein-calorie malnutrition, severe 01/30/2020  . Brain tumor ([place]) 01/27/2020  . Low folate 01/23/2020  . Anemia of chronic disease 01/23/2020  . Small bowel obstruction from retained endoscopy capsule at stricture s/p ileal resection 01/19/2020 01/18/2020  . 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 Primary localized osteoarthritis of left hip 08-21-19  . Death of family member-  husband passed Jul 2020 04/29/2019  . 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 Sleep difficulties 09/09/2018  . Pre-diabetes 03/09/2018  . Postmenopausal state- went thru early 40's 01/02/2018  . Primary osteoarthritis of left hip 01/02/2018  . Caregiver burden- for husband with ALLeukemia 01/02/2018  . Left hip pain 01/02/2018  . Osteopenia 07/31/2016  . Hypertension 05/20/2011  . Menopausal state 05/20/2011    [facility] ,[place], [place]  03/07/2020, 1:38 PM  [place] [address] [place], [place], [address] Phone: [phone]   Fax:  [phone]   Name:  SKYAH HANNON MRN: [id number] Date of Birth: 11-16-1960

## 2020-03-07 NOTE — Therapy (Signed)
[facility] [address] [place], [place], [address] Phone: [phone]   Fax:  [phone]  Physical Therapy Treatment  Patient Details  Name: Cristina Conner MRN: [id number] Date of Birth: 04-Sep-1960 Referring Provider (PT): Reesa Chew, [place]   Encounter Date: 03/07/2020   PT End of Session - 03/07/20 1047    Visit Number 7    Number of Visits 17    Date for PT Re-Evaluation 04/14/20    Authorization Type [employer] - [place]    PT Start Time [phone]   started pt early due to previous no show   PT Stop Time 1042    PT Time Calculation (min) 43 min    Equipment Utilized During Treatment Gait belt    Activity Tolerance Patient tolerated treatment well    Behavior During Therapy [facility] for tasks assessed/performed           Past Medical History:  Diagnosis Date  . Anxiety   . Arthritis   . Diverticulosis   . GERD (gastroesophageal reflux disease)   . Hypertension   . Osteopenia   . Post-operative nausea and vomiting    vomiting after general anesthesia per pt.  . Pre-diabetes     Past Surgical History:  Procedure Laterality Date  . CESAREAN SECTION     x 2  . COLONOSCOPY    . CRANIOTOMY Left 01/27/2020   Procedure: Left Craniotomy for Tumor Resection;  Surgeon: Judith Part, MD;  Location: [facility];  Service: Neurosurgery;  Laterality: Left;  . ENDOMETRIAL ABLATION    . ESOPHAGOGASTRODUODENOSCOPY  10/2019  . LAPAROSCOPY N/A 01/19/2020   Procedure: LAPAROSCOPY DIAGNOSTIC LAPAROTOMY WITH SMALL BOWEL RESECTION;  Surgeon: Ileana Roup, MD;  [facility];  Service: General;  Laterality: N/A;  . PELVIC LAPAROSCOPY  1999   left salpingectomy, excision of Right, paratubal cyst  . PELVIC LAPAROSCOPY     laparoscopy with lysis of pelvic adhesions  . SHOULDER SURGERY  11/2010   "FROZEN SHOULDER"  . TOTAL HIP ARTHROPLASTY Left 08/17/2019   Procedure: LEFT TOTAL HIP ARTHROPLASTY ANTERIOR APPROACH;  Surgeon:  Melrose Nakayama, MD;  [facility];  Service: Orthopedics;  Laterality: Left;  . TUBAL LIGATION    . UPPER GASTROINTESTINAL ENDOSCOPY    . VENTRICULOSTOMY Left 01/27/2020   Procedure: VENTRICULOSTOMY;  Surgeon: Judith Part, MD;  Location: [facility];  Service: Neurosurgery;  Laterality: Left;    There were no vitals filed for this visit.   Subjective Assessment - 03/07/20 1001    Subjective No falls. Exercises are going well at home. Feeling more stiff today.    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 Yes    Pain Score 3     Pain Location --   forehead   Pain Orientation Left    Pain Descriptors / Indicators Aching    Pain Relieving Factors tylenol                             [facility] Adult PT Treatment/Exercise - 03/07/20 1034      Transfers  Transfers Stand to Sit;Sit to Stand    Sit to Stand 5: Supervision;Without upper extremity assist    Stand to Sit 5: Supervision;Without upper extremity assist    Comments x10 reps, with green floor bubble under RLE for incr weight shift and muscle activation on LLE, sit <> stands with focus on slow eccentric control when lowering back down to mat      Ambulation/Gait   Ambulation/Gait Yes    Ambulation/Gait Assistance 5: Supervision    Ambulation/Gait Assistance Details plus additional clinic distances    Ambulation Distance (Feet) 115 Feet    Assistive device None    Gait Pattern Step-through pattern;Decreased arm swing - left   decr heel strike at times on L    Ambulation Surface Indoor;Level      Knee/Hip Exercises: Standing   Lateral Step Up Left;1 set;10 reps;Hand Hold: 1;Step  Height: 6"    Lateral Step Up Limitations floating non-stance leg, cues for glute activation    Forward Step Up Left;1 set;10 reps;Hand Hold: 1;Step Height: 6"    Forward Step Up Limitations floating non-stance leg for SLS, times without UE support               Balance Exercises - 03/07/20 1020      Balance Exercises: Standing   SLS Eyes open;Foam/compliant surface;Intermittent upper extremity support;Limitations    SLS Limitations on blue foam beam: standing on LLE forward toe taps to cone with RLE x10 reps, progressing to forward tap with RLE to cone and then cross body tap to 2nd cone x10 reps    Stepping Strategy Anterior;Posterior;Foam/compliant surface;10 reps;Limitations    Stepping Strategy Limitations BUE support > no UE support    Tandem Gait Forward;Intermittent upper extremity support;Foam/compliant surface;3 reps;Limitations    Tandem Gait Limitations on blue foam beam, nice and slow down and back x3 reps    Other Standing Exercises holding tandem stance on blue foam beam, multiple reps B, with intermittent UE support, holds for approx. 10-15 seconds with intermittent UE support    Other Standing Exercises Comments on red compliant mat: alternating steps over 3 smaller hurdles, cues for incr hip/knee flexion to clear and performing with a wider BOS as pt with tendency to perform too narrow, intermittent UE support and min guard for balance,down and back x4 reps               PT Short Term Goals - 03/01/20 1354      PT SHORT TERM GOAL #1   Title Improve Berg balance score by at least 4 points for reduced fall risk.    Baseline BERG assessed on 02/17/20, scored 50/56.    Time 4    Period Weeks    Status Revised    Target Date 03/17/20      PT SHORT TERM GOAL #2   Title Pt will improve TUG score from 14.57 secs to </= 12 secs without use of SPC for reduced fall risk.    Baseline 14.57 secs - pt carried cane    Time 4    Period Weeks    Status New    Target  Date 03/17/20      PT SHORT TERM GOAL #3   Title Pt will ambulate 500' without use of SPC on flat even surfaces with SBA for incr. community accessibility.    Baseline pt using cane - 100' at eval    Time 4    Period Weeks    Status New  Target Date 03/17/20      PT SHORT TERM GOAL #4   Title Pt will amb. 65' with horizontal head turns without device with supervision for safety with environmental scanning.    Time 4    Period Weeks    Status New    Target Date 03/17/20      PT SHORT TERM GOAL #5   Title Pt will participate in aquatic therapy after permission obtained from MD (craniotomy incision healed).    Time 4    Period Weeks    Status New    Target Date 03/17/20      PT SHORT TERM GOAL #6   Title Independent in balance HEP.    Time 4    Period Weeks    Status New    Target Date 03/17/20      PT SHORT TERM GOAL #7   Title Perform SOT & establish goal as appropriate.    Time 4    Period Weeks    Status New    Target Date 04/21/20             PT Long Term Goals - 03/01/20 1354      PT LONG TERM GOAL #1   Title Pt will improve FGA score by at least 8 points from baseline score for incr. safety with amb.    Baseline TBA    Time 8    Period Weeks    Status New      PT LONG TERM GOAL #2   Title Pt will improve Berg balance test by at least 10 points from baseline for reduced fall risk.    Time 8    Period Weeks    Status New      PT LONG TERM GOAL #3   Title Pt will amb. 1000' on flat even/uneven srufaces without device with supervision.    Time 8    Period Weeks    Status New      PT LONG TERM GOAL #4   Title Pt will be independent in updated HEP including aquatic exercises at time of D/C from PT.    Time 8    Period Weeks    Status New      PT LONG TERM GOAL #5   Title Pt will amb. 42' with head turns with no c/o dizziness and no imbalance for incr. safety with environmental scanning.    Time 8    Period Weeks    Status New                  Plan - 03/07/20 1049    Clinical Impression Statement Focus of today's skilled session was standing balance strategies on compliant surfaces and LLE strengthening. Pt with incr LLE fatigue noted with SLS activities. Pt challenged by performing alternating obstacles on a compliant surface, needing UE support and min guard for balance at times and pt performing initially with a more narrow BOS. Pt is progressing well, will continue to progress towards LTGs.    Personal Factors and Comorbidities Comorbidity 2;Transportation;Behavior Pattern;Comorbidity 3+    Comorbidities cranial nerve VII palsy, craniotomy on 01-27-20 for tumor resection, unilateral vestibular schwannoma, anxiety,, s/p Lt THR Dec. 2020    Examination-Activity Limitations Transfers;Locomotion Level;Squat;Stairs;Stand;Carry    Examination-Participation Restrictions Meal Prep;Cleaning;Community Activity;Driving;Shop;Laundry;Interpersonal Relationship    Stability/Clinical Decision Making Evolving/Moderate complexity    Rehab Potential Good    PT Frequency 2x / week    PT Duration 8 weeks  PT Treatment/Interventions Aquatic Statistician;Therapeutic activities;Therapeutic exercise;Balance training;Neuromuscular re-education;Patient/family education;Vestibular    PT Next Visit Plan hip ABD strengthening, perform obstacles. Cont balance exs- SLS and LLE strengthening - hip and quad esp.  Continue functional BLE strengthening, standing balance on compliant surfaces with head turns, eyes closed. tandem balance. perform SOT when appropriate.    Consulted and Agree with Plan of Care Patient;Family member/caregiver    Family Member Consulted [name]           Patient will benefit from skilled therapeutic intervention in order to improve the following deficits and impairments:  Abnormal gait, Decreased balance, Decreased activity tolerance, Decreased coordination, Dizziness, Impaired  vision/preception  Visit Diagnosis: Unsteadiness on feet  Other abnormalities of gait and mobility  Muscle weakness (generalized)  Other lack of coordination     Problem List Patient Active Problem List   Diagnosis Date Noted  . Cranial nerve VII palsy   . Hypoalbuminemia due to protein-calorie malnutrition ([place])   . Acute blood loss anemia   . Sinus tachycardia   . Postoperative pain   . 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 Protein-calorie malnutrition, severe 01/30/2020  . Brain tumor ([place]) 01/27/2020  . Low folate 01/23/2020  . Anemia of chronic disease 01/23/2020  . Small bowel obstruction from retained endoscopy capsule at stricture s/p ileal resection 01/19/2020 01/18/2020  . 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 Primary localized osteoarthritis of left hip 09/14/19  . Death of family member-  husband passed Jul 2020 04/29/2019  . 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 Sleep difficulties 09/09/2018  . Pre-diabetes 03/09/2018  . Postmenopausal state- went thru early 40's 01/02/2018  . Primary osteoarthritis of left hip 01/02/2018  . Caregiver burden- for husband with ALLeukemia 01/02/2018  . Left hip pain 01/02/2018  . Osteopenia 07/31/2016  . Hypertension 05/20/2011  . Menopausal state 05/20/2011    Arliss Journey, PT, DPT  03/07/2020, 10:54 AM  [facility] [address] [place], [place], [address] Phone: [phone]   Fax:  [phone]  Name: Cristina Conner MRN: [id number] Date of Birth: 08-Mar-1961

## 2020-03-09 ENCOUNTER — Ambulatory Visit: Payer: BC Managed Care – PPO

## 2020-03-09 ENCOUNTER — Other Ambulatory Visit: Payer: Self-pay

## 2020-03-09 ENCOUNTER — Encounter: Payer: Self-pay | Admitting: Physical Therapy

## 2020-03-09 ENCOUNTER — Encounter: Payer: Self-pay | Admitting: Occupational Therapy

## 2020-03-09 ENCOUNTER — Ambulatory Visit: Payer: BC Managed Care – PPO | Admitting: Occupational Therapy

## 2020-03-09 ENCOUNTER — Ambulatory Visit: Payer: BC Managed Care – PPO | Admitting: Physical Therapy

## 2020-03-09 DIAGNOSIS — R2681 Unsteadiness on feet: Secondary | ICD-10-CM

## 2020-03-09 DIAGNOSIS — M6281 Muscle weakness (generalized): Secondary | ICD-10-CM

## 2020-03-09 DIAGNOSIS — R278 Other lack of coordination: Secondary | ICD-10-CM

## 2020-03-09 DIAGNOSIS — R41844 Frontal lobe and executive function deficit: Secondary | ICD-10-CM

## 2020-03-09 DIAGNOSIS — R2689 Other abnormalities of gait and mobility: Secondary | ICD-10-CM

## 2020-03-09 DIAGNOSIS — R41841 Cognitive communication deficit: Secondary | ICD-10-CM

## 2020-03-09 DIAGNOSIS — R41840 Attention and concentration deficit: Secondary | ICD-10-CM

## 2020-03-09 DIAGNOSIS — R1311 Dysphagia, oral phase: Secondary | ICD-10-CM

## 2020-03-09 NOTE — Therapy (Signed)
[facility] [address] [place], [place], [address] Phone: [phone]   Fax:  [phone]  Speech Language Pathology Treatment  Patient Details  Name: Cristina Conner MRN: [id number] Date of Birth: [birth year] Referring Provider (SLP): Dr. Alger Simons   Encounter Date: 03/09/2020   End of Session - 03/09/20 1138    Visit Number 8    Number of Visits 17    Date for SLP Re-Evaluation 04/12/20    SLP Start Time 41    SLP Stop Time  1100    SLP Time Calculation (min) 40 min    Activity Tolerance Patient tolerated treatment well           Past Medical History:  Diagnosis Date   Anxiety    Arthritis    Diverticulosis    GERD (gastroesophageal reflux disease)    Hypertension    Osteopenia    Post-operative nausea and vomiting    vomiting after general anesthesia per pt.   Pre-diabetes     Past Surgical History:  Procedure Laterality Date   CESAREAN SECTION     x 2   COLONOSCOPY     CRANIOTOMY Left 01/27/2020   Procedure: Left Craniotomy for Tumor Resection;  Surgeon: Judith Part, MD;  Location: [facility];  Service: Neurosurgery;  Laterality: Left;   ENDOMETRIAL ABLATION     ESOPHAGOGASTRODUODENOSCOPY  10/2019   LAPAROSCOPY N/A 01/19/2020   Procedure: LAPAROSCOPY DIAGNOSTIC LAPAROTOMY WITH SMALL BOWEL RESECTION;  Surgeon: Ileana Roup, MD;  [facility];  Service: General;  Laterality: N/A;   PELVIC LAPAROSCOPY  1999   left salpingectomy, excision of Right, paratubal cyst   PELVIC LAPAROSCOPY     laparoscopy with lysis of pelvic adhesions   SHOULDER SURGERY  11/2010   "FROZEN SHOULDER"   TOTAL HIP ARTHROPLASTY Left 08/17/2019   Procedure: LEFT TOTAL HIP ARTHROPLASTY ANTERIOR APPROACH;  Surgeon: Melrose Nakayama, MD;  [facility];  Service: Orthopedics;  Laterality: Left;   TUBAL LIGATION     UPPER GASTROINTESTINAL ENDOSCOPY     VENTRICULOSTOMY Left 01/27/2020    Procedure: VENTRICULOSTOMY;  Surgeon: Judith Part, MD;  Location: [facility];  Service: Neurosurgery;  Laterality: Left;    There were no vitals filed for this visit.   Subjective Assessment - 03/09/20 1027    Subjective Pt enters withhomework compleeted - did not double check but feels confident.    Currently in Pain? No/denies                 ADULT SLP TREATMENT - 03/09/20 1027      General Information   Behavior/Cognition Alert;Cooperative;Pleasant mood      Treatment Provided   Treatment provided Cognitive-Linquistic      Cognitive-Linquistic Treatment   Treatment focused on Cognition;Patient/family/caregiver education    Skilled Treatment Due to pt's endorsement of "not doing any (homework) yesterday, SLP talked with pt about how to plan out her daily calendar to keep her focus throughout the day. SLP assisted pt in looking at reminders app and calendar app to track/plan her schedule - she will get assistance from daughter to choose which app to use. SLP talked about pt's job duties and she is thinking about modifying her position to not be responsible for as much multi-tasking. SLP asked pt to think more about this.       Assessment / Recommendations / Plan   Plan Continue with current plan of care      Progression Toward  Goals   Progression toward goals Progressing toward goals            SLP Education - 03/09/20 1138    Education Details reminders vs calendar app for schedule    Person(s) Educated Patient    Methods Explanation    Comprehension Verbalized understanding            SLP Short Term Goals - 03/09/20 1139      SLP SHORT TERM GOAL #1   Title Pt will complete HEP for oral dysphagia/ CN VII palsy with mod I    Time 2    Period Weeks    Status On-going      SLP SHORT TERM GOAL #2   Title Pt will follow swallow precautions and diet recommendation with mod I    Time 2    Period Weeks    Status On-going      SLP SHORT TERM GOAL #3    Title Pt will demonstrate anticipatory awareness on high level cognitive linguistic tasks in therapy and at home with rare min A    Time 2    Period Weeks      SLP SHORT TERM GOAL #4   Title Pt will alternate attention on IADL tasks/high level congitive linguistic tasks with 95% accuracy on each with rare min A    Time 2    Status Achieved            SLP Long Term Goals - 03/09/20 1039      SLP LONG TERM GOAL #1   Title Pt will utilize compensations for attention to successfully complete IADL's and high level congitive linguistic tasks with 90% accuracy and rare min A    Time 6    Period Weeks    Status On-going      SLP LONG TERM GOAL #2   Title Pt will generate 4 accomodations/compensations for attention to carryover in workplace with rare min A over 2 sessions    Time 6    Period Weeks    Status On-going      SLP LONG TERM GOAL #3   Title Pt will tolerate regular diet without avoiding less than 2 foods with rare min A over 2 sessions    Time 6    Period Weeks    Status On-going            Plan - 03/09/20 1139    Clinical Impression Statement [place] training in compensations for high level attention for 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 Continue skilled ST.    Speech Therapy Frequency 2x / week    Duration --   8 weeks or 17 visits   Treatment/Interventions [place]iration precaution training;Environmental controls;Cueing hierarchy;Oral motor exercises;SLP instruction and feedback;Compensatory strategies;Functional tasks;Cognitive reorganization;Compensatory techniques;Diet toleration management by SLP;Trials of upgraded texture/liquids;Internal/external aids;Multimodal communcation approach;Patient/family education    Potential to Achieve Goals Good           Patient will benefit from skilled therapeutic intervention in order to improve the following deficits and impairments:   Cognitive communication  deficit  Dysphagia, oral phase    Problem List Patient Active Problem List   Diagnosis Date Noted   Cranial nerve VII palsy    Hypoalbuminemia due to protein-calorie malnutrition ([place])    Acute blood loss anemia    Sinus tachycardia    Postoperative pain    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[place]) 02/03/2020   Protein-calorie  malnutrition, severe 01/30/2020   Brain tumor ([place]) 01/27/2020   Low folate 01/23/2020   Anemia of chronic disease 01/23/2020   Small bowel obstruction from retained endoscopy capsule at stricture s/p ileal resection 01/19/2020 01/18/2020   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 [place], [place], [address] Phone: [phone]   Fax:  [phone]   Name: JEANIA NATER MRN: [id number] Date of Birth: 01/08/1961

## 2020-03-09 NOTE — Therapy (Signed)
[facility] [address] [place], [place], [address] Phone: [phone]   Fax:  [phone]  Occupational Therapy Treatment  Patient Details  Name: Cristina Conner MRN: [id number] Date of Birth: Dec 13, 1960 Referring Provider (OT): Dr. Alger Simons   Encounter Date: 03/09/2020   OT End of Session - 03/09/20 0959    Visit Number 4    Number of Visits 25    Date for OT Re-Evaluation 05/15/20    Authorization Type BCBS covered 100%, no visit limit    OT Start Time 0935    OT Stop Time 1015    OT Time Calculation (min) 40 min    Activity Tolerance Patient tolerated treatment well    Behavior During Therapy [facility] for tasks assessed/performed           Past Medical History:  Diagnosis Date  . Anxiety   . Arthritis   . Diverticulosis   . GERD (gastroesophageal reflux disease)   . Hypertension   . Osteopenia   . Post-operative nausea and vomiting    vomiting after general anesthesia per pt.  . Pre-diabetes     Past Surgical History:  Procedure Laterality Date  . CESAREAN SECTION     x 2  . COLONOSCOPY    . CRANIOTOMY Left 01/27/2020   Procedure: Left Craniotomy for Tumor Resection;  Surgeon: Judith Part, MD;  Location: [facility];  Service: Neurosurgery;  Laterality: Left;  . ENDOMETRIAL ABLATION    . ESOPHAGOGASTRODUODENOSCOPY  10/2019  . LAPAROSCOPY N/A 01/19/2020   Procedure: LAPAROSCOPY DIAGNOSTIC LAPAROTOMY WITH SMALL BOWEL RESECTION;  Surgeon: Ileana Roup, MD;  [facility];  Service: General;  Laterality: N/A;  . PELVIC LAPAROSCOPY  1999   left salpingectomy, excision of Right, paratubal cyst  . PELVIC LAPAROSCOPY     laparoscopy with lysis of pelvic adhesions  . SHOULDER SURGERY  11/2010   "FROZEN SHOULDER"  . TOTAL HIP ARTHROPLASTY Left 08/17/2019   Procedure: LEFT TOTAL HIP ARTHROPLASTY ANTERIOR APPROACH;  Surgeon: Melrose Nakayama, MD;  Location[facility];  Service: Orthopedics;   Laterality: Left;  . TUBAL LIGATION    . UPPER GASTROINTESTINAL ENDOSCOPY    . VENTRICULOSTOMY Left 01/27/2020   Procedure: VENTRICULOSTOMY;  Surgeon: Judith Part, MD;  Location: [facility];  Service: Neurosurgery;  Laterality: Left;    There were no vitals filed for this visit.   Subjective Assessment - 03/09/20 0939    Subjective  Pt reports uncovering L eye for about 1 hour this morning and that L eye is redder than it was this morning.    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 decr hearing L side    Limitations fall risk, L eye patched due to inability to close eye    Patient Stated Goals wants to be able to drive, improve balance, perform financial management, be able to take care of mother    Currently in Pain? No/denies            Functional reaching with LUE to place/remove clothespins with 1-8lb resistance for incr strength/coordination with min difficulty.  Picking up various small objects and sorting into divided container for incr coordination.  Pt's L eye continues to be very  red with some swelling noted.  Pt reports keeping uncovered for about 1 hr today.  Recommended that pt not keep uncovered for that long for now. (sees neuro ophthalmologist 7/22).  Discussed putting in eye drops herself and pt reports that she was able to do so with mirror and with dtr observing/providing feedback.  Discussed/problem solved possible challenges with being left alone and safety precautions (as dtr is to start as prn SLP at [facility] on Monday).    Occluded R eye, with glasses pt reports only seeing big shapes, light (gray/shadows?) with L  eye.          OT Education - 03/09/20 1529    Education Details Recommended that pt seek MD clarification on how often to use eye drops and if she should use the 2 different types together (discussed questions to ask), recommended pt not try to keep eye uncovered for long periods but that she can uncover and use eye drops and try to open/close eye before re-covering, discussed different strategies for covering eye (taping, gauze, etc).    Person(s) Educated Patient    Methods Explanation    Comprehension Verbalized understanding            OT Short Term Goals - 03/07/20 1141      OT SHORT TERM GOAL #1   Title Pt will be independent with initial HEP for LUE coordination and strength.--check STGs 03/31/20    Time 6    Period Weeks    Status New    Target Date 03/31/20      OT SHORT TERM GOAL #2   Title Pt will improve L hand coordination for ADLs as shown by improving time on 9-hole peg test by at least 12sec.    Baseline 40.40sec    Time 6    Period Weeks    Status New      OT SHORT TERM GOAL #3   Title Pt will be able to perform tabletop scanning with at least 95% accuracy for incr attention and IADL tasks.    Time 6    Period Weeks    Status New      OT SHORT TERM GOAL #4   Title Pt will perform simple home maintenance tasks mod I.    Time 6    Period Weeks    Status Achieved   03/07/20:  sweeping, laundry, dishes, wiping counters     OT SHORT TERM GOAL #5   Title Pt will perform simple financial management tasks with min A.    Time 6    Period Weeks    Status New             OT Long Term Goals - 02/15/20 1532      OT LONG TERM GOAL #1   Title Pt will be independent with updated HEP.--check LTGs 05/15/20    Time 12    Period Weeks    Status New    Target Date 05/15/20      OT LONG TERM GOAL #2   Title Pt will improve L grip strength by at least 8lbs to assist with home maintenance tasks/in prep for yardwork.    Time 12    Period Weeks    Status New       OT LONG TERM GOAL #3   Title Pt will demo improved balance and strength to be able to water plants.    Time 12    Period Weeks    Status New  OT LONG TERM GOAL #4   Title Pt will perform environmental scanning with at least 90% accuracy for incr safety for community activities.    Time 12    Period Weeks    Status New      OT LONG TERM GOAL #5   Title Pt will perform previous financial management tasks with supervision.    Time 12    Period Weeks    Status New      Long Term Additional Goals   Additional Long Term Goals Yes      OT LONG TERM GOAL #6   Title Pt will be able to alternate attention between at least 1 physical and 1 cognitive task with at least 90% accuracy for incr safety with IADLs.    Time 12    Period Weeks    Status New                 Plan - 03/09/20 1001    Clinical Impression Statement Pt is progressing towards goals with improving L hand coordination, but continues to have no functional vision in L eye (sees shapes/some light only with glasses).    OT Occupational Profile and History Detailed Assessment- Review of Records and additional review of physical, cognitive, psychosocial history related to current functional performance    Occupational performance deficits (Please refer to evaluation for details): ADL's;IADL's;Work;Leisure;Other    Body Structure / Function / Physical Skills ADL;Hearing;Vision;IADL;Balance;Mobility;Strength;Coordination;FMC;UE functional use    Cognitive Skills Attention;Memory;Perception;Thought    Rehab Potential Good    Clinical Decision Making Several treatment options, min-mod task modification necessary    Comorbidities Affecting Occupational Performance: May have comorbidities impacting occupational performance    Modification or Assistance to Complete Evaluation  Min-Moderate modification of tasks or assist with assess necessary to complete eval    OT Frequency 2x / week    OT Duration 12 weeks   +eval   OT  Treatment/Interventions Self-care/ADL training;DME and/or AE instruction;Therapeutic activities;Aquatic Therapy;Therapeutic exercise;Cognitive remediation/compensation;Visual/perceptual remediation/compensation;Functional Mobility Training;Neuromuscular education;Patient/family education    Plan attention,environmental scanning, simple financial management    Consulted and Agree with Plan of Care Patient;Family member/caregiver    Family Member Consulted dtr           Patient will benefit from skilled therapeutic intervention in order to improve the following deficits and impairments:   Body Structure / Function / Physical Skills: ADL, Hearing, Vision, IADL, Balance, Mobility, Strength, Coordination, FMC, UE functional use Cognitive Skills: Attention, Memory, Perception, Thought     Visit Diagnosis: Other lack of coordination  Attention and concentration deficit  Frontal lobe and executive function deficit  Unsteadiness on feet  Other abnormalities of gait and mobility  Muscle weakness (generalized)    Problem List Patient Active Problem List   Diagnosis Date Noted  . Cranial nerve VII palsy   . Hypoalbuminemia due to protein-calorie malnutrition ([place])   . Acute blood loss anemia   . Sinus tachycardia   . Postoperative pain   . 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[place]) 02/03/2020  . Protein-calorie malnutrition, severe 01/30/2020  . Brain tumor ([place]) 01/27/2020  . Low folate 01/23/2020  . Anemia of chronic disease 01/23/2020  . Small bowel obstruction from retained endoscopy capsule at stricture s/p ileal resection 01/19/2020 01/18/2020  . 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 Primary localized osteoarthritis of left hip 2019-08-31  . Death of family member-  husband passed Jul 2020 04/29/2019  . 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  Sleep difficulties 09/09/2018  . Pre-diabetes 03/09/2018  . Postmenopausal state- went thru  early 40's 01/02/2018  . Primary osteoarthritis of left hip 01/02/2018  . Caregiver burden- for husband with ALLeukemia 01/02/2018  . Left hip pain 01/02/2018  . Osteopenia 07/31/2016  . Hypertension 05/20/2011  . Menopausal state 05/20/2011    [facility] 03/09/2020, 3:33 PM  [facility] [address] [place], [place], [address] Phone: [phone]   Fax:  [phone]  Name: Cristina Conner MRN: [id number] Date of Birth: 08/20/1961   Vianne Bulls, OTR/L [facility] [address] [place], [place]  [address] [phone] phone [phone] 03/09/20 3:33 PM

## 2020-03-09 NOTE — Therapy (Signed)
[facility] [address] [place], [place], [address] Phone: [phone]   Fax:  [phone]  Physical Therapy Treatment  Patient Details  Name: Cristina Conner MRN: [id number] Date of Birth: 08-20-1961 Referring Provider (PT): Reesa Chew, [place]   Encounter Date: 03/09/2020   PT End of Session - 03/09/20 0929    Visit Number 8    Number of Visits 17    Date for PT Re-Evaluation 04/14/20    Authorization Type BCBS - [place]    PT Start Time 0845    PT Stop Time 0928    PT Time Calculation (min) 43 min    Equipment Utilized During Treatment Gait belt    Activity Tolerance Patient tolerated treatment well    Behavior During Therapy [facility] for tasks assessed/performed           Past Medical History:  Diagnosis Date  . Anxiety   . Arthritis   . Diverticulosis   . GERD (gastroesophageal reflux disease)   . Hypertension   . Osteopenia   . Post-operative nausea and vomiting    vomiting after general anesthesia per pt.  . Pre-diabetes     Past Surgical History:  Procedure Laterality Date  . CESAREAN SECTION     x 2  . COLONOSCOPY    . CRANIOTOMY Left 01/27/2020   Procedure: Left Craniotomy for Tumor Resection;  Surgeon: Judith Part, MD;  Location: [facility];  Service: Neurosurgery;  Laterality: Left;  . ENDOMETRIAL ABLATION    . ESOPHAGOGASTRODUODENOSCOPY  10/2019  . LAPAROSCOPY N/A 01/19/2020   Procedure: LAPAROSCOPY DIAGNOSTIC LAPAROTOMY WITH SMALL BOWEL RESECTION;  Surgeon: Ileana Roup, MD;  [facility];  Service: General;  Laterality: N/A;  . PELVIC LAPAROSCOPY  1999   left salpingectomy, excision of Right, paratubal cyst  . PELVIC LAPAROSCOPY     laparoscopy with lysis of pelvic adhesions  . SHOULDER SURGERY  11/2010   "FROZEN SHOULDER"  . TOTAL HIP ARTHROPLASTY Left 08/17/2019   Procedure: LEFT TOTAL HIP ARTHROPLASTY ANTERIOR APPROACH;  Surgeon: Melrose Nakayama, MD;  [facility];  Service:  Orthopedics;  Laterality: Left;  . TUBAL LIGATION    . UPPER GASTROINTESTINAL ENDOSCOPY    . VENTRICULOSTOMY Left 01/27/2020   Procedure: VENTRICULOSTOMY;  Surgeon: Judith Part, MD;  Location: [facility];  Service: Neurosurgery;  Laterality: Left;    There were no vitals filed for this visit.   Subjective Assessment - 03/09/20 0851    Subjective Sees the eye doctor July 22.    Pertinent History tachycardia, HTN after lapaaroscopic sx on 01-18-20, Lt THR on 08-17-19, anxiety    Diagnostic tests CT revealed hydrocephalus with tumor on Lt side 4th ventricle ;  MRI revealed Lt cerebellar pontine mass compatible with vestibular schwannoma    Patient Stated Goals get back to doing [employer] - would like to get back to work at [employer]    Currently in Pain? No/denies                             [facility] Adult PT Treatment/Exercise - 03/09/20 0001      Ambulation/Gait   Ambulation/Gait Yes    Ambulation/Gait Assistance 5: Supervision    Ambulation/Gait Assistance Details ambulated throughout session without AD    Ambulation Distance (Feet) --   clinic distances   Assistive device None    Gait Pattern Step-through pattern;Decreased arm swing - left    Ambulation  Surface Level;Indoor               Balance Exercises - 03/09/20 0001      Balance Exercises: Standing   Standing Eyes Closed Narrow base of support (BOS);Foam/compliant surface    Standing Eyes Closed Limitations 2 x 30 seconds with feet hip width distance, then progressing to feet together with 3 reps of 20 seconds    Other Standing Exercises on blue foam beam: standing with feet apart eyes closed and fwd weight shift onto toes and back onto heels x10 reps, with feet apart 2 x 10 reps head turns, 2 x 10 reps head nods - in corner for safety, min guard for balance    Other Standing Exercises Comments On blue/red compliant mat, each down and back x3 reps - intermittent fingertip support on wall and min guard/min A for  balance: standing marching with cues for slowed and controlled for SLS, retro gait with cues for weight shift and incr step length, side stepping with mini squats. Forward stepping over 4" obstacle x10 reps B with no UE support, lateral stepping over obstacle  Standing on LLE x10 reps for closed chain hip ABD strengthening               PT Short Term Goals - 03/01/20 1354      PT SHORT TERM GOAL #1   Title Improve Berg balance score by at least 4 points for reduced fall risk.    Baseline BERG assessed on 02/17/20, scored 50/56.    Time 4    Period Weeks    Status Revised    Target Date 03/17/20      PT SHORT TERM GOAL #2   Title Pt will improve TUG score from 14.57 secs to </= 12 secs without use of SPC for reduced fall risk.    Baseline 14.57 secs - pt carried cane    Time 4    Period Weeks    Status New    Target Date 03/17/20      PT SHORT TERM GOAL #3   Title Pt will ambulate 500' without use of SPC on flat even surfaces with SBA for incr. community accessibility.    Baseline pt using cane - 100' at eval    Time 4    Period Weeks    Status New    Target Date 03/17/20      PT SHORT TERM GOAL #4   Title Pt will amb. 68' with horizontal head turns without device with supervision for safety with environmental scanning.    Time 4    Period Weeks    Status New    Target Date 03/17/20      PT SHORT TERM GOAL #5   Title Pt will participate in aquatic therapy after permission obtained from MD (craniotomy incision healed).    Time 4    Period Weeks    Status New    Target Date 03/17/20      PT SHORT TERM GOAL #6   Title Independent in balance HEP.    Time 4    Period Weeks    Status New    Target Date 03/17/20      PT SHORT TERM GOAL #7   Title Perform SOT & establish goal as appropriate.    Time 4    Period Weeks    Status New    Target Date 04/21/20             PT Long  Term Goals - 03/01/20 1354      PT LONG TERM GOAL #1   Title Pt will improve FGA  score by at least 8 points from baseline score for incr. safety with amb.    Baseline TBA    Time 8    Period Weeks    Status New      PT LONG TERM GOAL #2   Title Pt will improve Berg balance test by at least 10 points from baseline for reduced fall risk.    Time 8    Period Weeks    Status New      PT LONG TERM GOAL #3   Title Pt will amb. 1000' on flat even/uneven srufaces without device with supervision.    Time 8    Period Weeks    Status New      PT LONG TERM GOAL #4   Title Pt will be independent in updated HEP including aquatic exercises at time of D/C from PT.    Time 8    Period Weeks    Status New      PT LONG TERM GOAL #5   Title Pt will amb. 81' with head turns with no c/o dizziness and no imbalance for incr. safety with environmental scanning.    Time 8    Period Weeks    Status New                 Plan - 03/09/20 1218    Clinical Impression Statement Focus of today's skilled session focused on balance on compliant surfaces. Pt demonstrating improved vestibular input for balance - able to perform feet together eyes closed on foam for a max of 20 seconds today before losing balance posteriorly and to the L. Pt needing min A for balance activities at times with SLS. Will continue to progress towards LTGs.    Personal Factors and Comorbidities Comorbidity 2;Transportation;Behavior Pattern;Comorbidity 3+    Comorbidities cranial nerve VII palsy, craniotomy on 01-27-20 for tumor resection, unilateral vestibular schwannoma, anxiety,, s/p Lt THR Dec. 2020    Examination-Activity Limitations Transfers;Locomotion Level;Squat;Stairs;Stand;Carry    Examination-Participation Restrictions Meal Prep;Cleaning;Community Activity;Driving;Shop;Laundry;Interpersonal Relationship    Stability/Clinical Decision Making Evolving/Moderate complexity    Rehab Potential Good    PT Frequency 2x / week    PT Duration 8 weeks    PT Treatment/Interventions Aquatic Therapy;Gait  training;Stair training;Therapeutic activities;Therapeutic exercise;Balance training;Neuromuscular re-education;Patient/family education;Vestibular    PT Next Visit Plan STGs due next week. hip ABD strengthening, perform obstacles. Cont balance exs- SLS and LLE strengthening - hip and quad esp.  Continue functional BLE strengthening, standing balance on compliant surfaces with head turns, eyes closed. tandem balance. perform SOT when appropriate.    Consulted and Agree with Plan of Care Patient;Family member/caregiver    Family Member Consulted [name]           Patient will benefit from skilled therapeutic intervention in order to improve the following deficits and impairments:  Abnormal gait, Decreased balance, Decreased activity tolerance, Decreased coordination, Dizziness, Impaired vision/preception  Visit Diagnosis: Other lack of coordination  Unsteadiness on feet  Other abnormalities of gait and mobility  Muscle weakness (generalized)     Problem List Patient Active Problem List   Diagnosis Date Noted  . Cranial nerve VII palsy   . Hypoalbuminemia due to protein-calorie malnutrition ([place])   . Acute blood loss anemia   . Sinus tachycardia   . Postoperative pain   . 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[place]) 02/03/2020  .  Protein-calorie malnutrition, severe 01/30/2020  . Brain tumor ([place]) 01/27/2020  . Low folate 01/23/2020  . Anemia of chronic disease 01/23/2020  . Small bowel obstruction from retained endoscopy capsule at stricture s/p ileal resection 01/19/2020 01/18/2020  . 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 Primary localized osteoarthritis of left hip 08-20-19  . Death of family member-  husband passed Jul 2020 04/29/2019  . 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 Sleep difficulties 09/09/2018  . Pre-diabetes 03/09/2018  . Postmenopausal state- went thru early 40's 01/02/2018  . Primary osteoarthritis of left hip  01/02/2018  . Caregiver burden- for husband with ALLeukemia 01/02/2018  . Left hip pain 01/02/2018  . Osteopenia 07/31/2016  . Hypertension 05/20/2011  . Menopausal state 05/20/2011    Arliss Journey, PT, DPT  03/09/2020, 12:21 PM  [facility] [address] [place], [place], [address] Phone: [phone]   Fax:  [phone]  Name: NADIYA PIERATT MRN: [id number] Date of Birth: 31-May-1961

## 2020-03-13 ENCOUNTER — Ambulatory Visit: Payer: BC Managed Care – PPO

## 2020-03-13 ENCOUNTER — Ambulatory Visit: Payer: BC Managed Care – PPO | Admitting: Speech Pathology

## 2020-03-13 ENCOUNTER — Encounter: Payer: Self-pay | Admitting: Speech Pathology

## 2020-03-13 ENCOUNTER — Other Ambulatory Visit: Payer: Self-pay

## 2020-03-13 VITALS — BP 138/86

## 2020-03-13 DIAGNOSIS — R2681 Unsteadiness on feet: Secondary | ICD-10-CM

## 2020-03-13 DIAGNOSIS — R41841 Cognitive communication deficit: Secondary | ICD-10-CM | POA: Diagnosis not present

## 2020-03-13 DIAGNOSIS — M6281 Muscle weakness (generalized): Secondary | ICD-10-CM

## 2020-03-13 DIAGNOSIS — R2689 Other abnormalities of gait and mobility: Secondary | ICD-10-CM

## 2020-03-13 DIAGNOSIS — R42 Dizziness and giddiness: Secondary | ICD-10-CM

## 2020-03-13 DIAGNOSIS — R1311 Dysphagia, oral phase: Secondary | ICD-10-CM

## 2020-03-13 NOTE — Therapy (Signed)
[facility] [address] [place], [place], [address] Phone: [phone]   Fax:  [phone]  Speech Language Pathology Treatment  Patient Details  Name: Cristina Conner MRN: [id number] Date of Birth: 1961/07/15 Referring Provider (SLP): Dr. Alger Simons   Encounter Date: 03/13/2020   End of Session - 03/13/20 1105    Visit Number 9    Number of Visits 17    Date for SLP Re-Evaluation 04/12/20    SLP Start Time 1014    SLP Stop Time  1102    SLP Time Calculation (min) 48 min    Activity Tolerance Patient tolerated treatment well           Past Medical History:  Diagnosis Date  . Anxiety   . Arthritis   . Diverticulosis   . GERD (gastroesophageal reflux disease)   . Hypertension   . Osteopenia   . Post-operative nausea and vomiting    vomiting after general anesthesia per pt.  . Pre-diabetes     Past Surgical History:  Procedure Laterality Date  . CESAREAN SECTION     x 2  . COLONOSCOPY    . CRANIOTOMY Left 01/27/2020   Procedure: Left Craniotomy for Tumor Resection;  Surgeon: Judith Part, MD;  Location: [facility];  Service: Neurosurgery;  Laterality: Left;  . ENDOMETRIAL ABLATION    . ESOPHAGOGASTRODUODENOSCOPY  10/2019  . LAPAROSCOPY N/A 01/19/2020   Procedure: LAPAROSCOPY DIAGNOSTIC LAPAROTOMY WITH SMALL BOWEL RESECTION;  Surgeon: Ileana Roup, MD;  Location: [facility];  Service: General;  Laterality: N/A;  . PELVIC LAPAROSCOPY  1999   left salpingectomy, excision of Right, paratubal cyst  . PELVIC LAPAROSCOPY     laparoscopy with lysis of pelvic adhesions  . SHOULDER SURGERY  11/2010   "FROZEN SHOULDER"  . TOTAL HIP ARTHROPLASTY Left 08/17/2019   Procedure: LEFT TOTAL HIP ARTHROPLASTY ANTERIOR APPROACH;  Surgeon: Melrose Nakayama, MD;  Location: [facility];  Service: Orthopedics;  Laterality: Left;  . TUBAL LIGATION    . UPPER GASTROINTESTINAL ENDOSCOPY    . VENTRICULOSTOMY Left 01/27/2020    Procedure: VENTRICULOSTOMY;  Surgeon: Judith Part, MD;  Location: [facility];  Service: Neurosurgery;  Laterality: Left;    There were no vitals filed for this visit.   Subjective Assessment - 03/13/20 1010    Subjective ":I try to follow the rules"    Currently in Pain? No/denies                 ADULT SLP TREATMENT - 03/13/20 1018      General Information   Behavior/Cognition Alert;Cooperative;Pleasant mood      Treatment Provided   Treatment provided Cognitive-Linquistic      Cognitive-Linquistic Treatment   Treatment focused on Cognition;Patient/family/caregiver education    Skilled Treatment Pt continues to report diffiuclty processing conversation. When asked, she afffirms that speech processing and word finding with fatigue or after completing complex task. She continues to take breaks in between tasks.. Organizing and writing directions for 6 errands in most time efficient manner with cues to ID errands she missed. She self corrected with mod I.  She participated in simple conversation during organization task with rare min A and 90% accuracy. Complex reasoning with minimal distraction required extended time and occasional min verbal cues for reasoning.   Swallowing: 15 min.Ongoing training for HEP for labial weakness with focusing on minimal right labial elevation and minimal retraction      Assessment / Recommendations / Plan  Plan Continue with current plan of care      Dysphagia Recommendations   Diet recommendations Dysphagia 3 (mechanical soft)    Liquids provided via Cup;Straw    Medication Administration Whole meds with liquid    Supervision Patient able to self feed    Compensations Check for pocketing;Slow rate      Progression Toward Goals   Progression toward goals Progressing toward goals            SLP Education - 03/13/20 1102    Education Details HEP,    Person(s) Educated Patient            SLP Short Term Goals - 03/13/20 1104       SLP SHORT TERM GOAL #1   Title Pt will complete HEP for oral dysphagia/ CN VII palsy with mod I    Time 1    Period Weeks    Status On-going      SLP SHORT TERM GOAL #2   Title Pt will follow swallow precautions and diet recommendation with mod I    Time 2    Period Weeks    Status On-going      SLP SHORT TERM GOAL #3   Title Pt will demonstrate anticipatory awareness on high level cognitive linguistic tasks in therapy and at home with rare min A    Time 1    Period Weeks      SLP SHORT TERM GOAL #4   Title Pt will alternate attention on IADL tasks/high level congitive linguistic tasks with 95% accuracy on each with rare min A    Time 1    Status Achieved            SLP Long Term Goals - 03/13/20 1105      SLP LONG TERM GOAL #1   Title Pt will utilize compensations for attention to successfully complete IADL's and high level congitive linguistic tasks with 90% accuracy and rare min A    Time 5    Period Weeks    Status On-going      SLP LONG TERM GOAL #2   Title Pt will generate 4 accomodations/compensations for attention to carryover in workplace with rare min A over 2 sessions    Time 5    Period Weeks    Status On-going      SLP LONG TERM GOAL #3   Title Pt will tolerate regular diet without avoiding less than 2 foods with rare min A over 2 sessions    Time 5    Period Weeks    Status On-going            Plan - 03/13/20 1104    Clinical Impression Statement [place] training in compensations for high level attention for 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 Continue skilled ST.    Speech Therapy Frequency 2x / week    Duration --   8 weeks or 17 visits   Treatment/Interventions [place]iration precaution training;Environmental controls;Cueing hierarchy;Oral motor exercises;SLP instruction and feedback;Compensatory strategies;Functional tasks;Cognitive reorganization;Compensatory techniques;Diet  toleration management by SLP;Trials of upgraded texture/liquids;Internal/external aids;Multimodal communcation approach;Patient/family education           Patient will benefit from skilled therapeutic intervention in order to improve the following deficits and impairments:   Dysphagia, oral phase  Cognitive communication deficit    Problem List Patient Active Problem List   Diagnosis Date Noted  . Cranial nerve VII  palsy   . Hypoalbuminemia due to protein-calorie malnutrition ([place])   . Acute blood loss anemia   . Sinus tachycardia   . Postoperative pain   . 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 Protein-calorie malnutrition, severe 01/30/2020  . Brain tumor ([place]) 01/27/2020  . Low folate 01/23/2020  . Anemia of chronic disease 01/23/2020  . Small bowel obstruction from retained endoscopy capsule at stricture s/p ileal resection 01/19/2020 01/18/2020  . 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 Primary localized osteoarthritis of left hip 2019/08/30  . Death of family member-  husband passed Jul 2020 04/29/2019  . 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 Sleep difficulties 09/09/2018  . Pre-diabetes 03/09/2018  . Postmenopausal state- went thru early 40's 01/02/2018  . Primary osteoarthritis of left hip 01/02/2018  . Caregiver burden- for husband with ALLeukemia 01/02/2018  . Left hip pain 01/02/2018  . Osteopenia 07/31/2016  . Hypertension 05/20/2011  . Menopausal state 05/20/2011    Loda Bialas, Annye Rusk MS, CCC-SLP 03/13/2020, 11:06 AM  [facility] [address] [place], [place], [address] Phone: [phone]   Fax:  [phone]   Name: Cristina Conner MRN: [id number] Date of Birth: Jun 17, 1961

## 2020-03-13 NOTE — Patient Instructions (Signed)
  Keep up the minimal elvis and smile (keep it little)  Processing conversation may be harder when you are fatigued or have just completed a harder task  Limit length of a conversation with [place] visitor about your limited time for visiting. Provide a concrete time, such as I can only visit 15- 20 minutes.

## 2020-03-13 NOTE — Therapy (Signed)
[facility] [address] [place], [place], [address] Phone: [phone]   Fax:  [phone]  Physical Therapy Treatment  Patient Details  Name: Cristina Conner MRN: [id number] Date of Birth: June 12, 1961 Referring Provider (PT): Reesa Chew, [place]   Encounter Date: 03/13/2020   PT End of Session - 03/13/20 0938    Visit Number 9    Number of Visits 17    Date for PT Re-Evaluation 04/14/20    Authorization Type [employer] - [place]    PT Start Time 0931    PT Stop Time 1015    PT Time Calculation (min) 44 min    Equipment Utilized During Treatment Gait belt    Activity Tolerance Patient tolerated treatment well    Behavior During Therapy [facility] for tasks assessed/performed           Past Medical History:  Diagnosis Date  . Anxiety   . Arthritis   . Diverticulosis   . GERD (gastroesophageal reflux disease)   . Hypertension   . Osteopenia   . Post-operative nausea and vomiting    vomiting after general anesthesia per pt.  . Pre-diabetes     Past Surgical History:  Procedure Laterality Date  . CESAREAN SECTION     x 2  . COLONOSCOPY    . CRANIOTOMY Left 01/27/2020   Procedure: Left Craniotomy for Tumor Resection;  Surgeon: Judith Part, MD;  Location: [facility];  Service: Neurosurgery;  Laterality: Left;  . ENDOMETRIAL ABLATION    . ESOPHAGOGASTRODUODENOSCOPY  10/2019  . LAPAROSCOPY N/A 01/19/2020   Procedure: LAPAROSCOPY DIAGNOSTIC LAPAROTOMY WITH SMALL BOWEL RESECTION;  Surgeon: Ileana Roup, MD;  [facility];  Service: General;  Laterality: N/A;  . PELVIC LAPAROSCOPY  1999   left salpingectomy, excision of Right, paratubal cyst  . PELVIC LAPAROSCOPY     laparoscopy with lysis of pelvic adhesions  . SHOULDER SURGERY  11/2010   "FROZEN SHOULDER"  . TOTAL HIP ARTHROPLASTY Left 08/17/2019   Procedure: LEFT TOTAL HIP ARTHROPLASTY ANTERIOR APPROACH;  Surgeon: Melrose Nakayama, MD;  Location[facility];   Service: Orthopedics;  Laterality: Left;  . TUBAL LIGATION    . UPPER GASTROINTESTINAL ENDOSCOPY    . VENTRICULOSTOMY Left 01/27/2020   Procedure: VENTRICULOSTOMY;  Surgeon: Judith Part, MD;  Location: [facility];  Service: Neurosurgery;  Laterality: Left;    Vitals:   03/13/20 0942  BP: 138/86     Subjective Assessment - 03/13/20 0936    Subjective Patient reports that she is wearing contact in R eye, no issues. Patient forgot to take BP medications this morning. still gets intermittent shooting pain on L side of head.    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 No/denies                             [facility] Adult PT Treatment/Exercise - 03/13/20 0001      Ambulation/Gait   Ambulation/Gait Yes    Ambulation/Gait Assistance 5: Supervision    Ambulation/Gait Assistance Details throughout therapy session with actvities    Assistive device  None    Gait Pattern Step-through pattern;Decreased arm swing - left    Ambulation Surface Level;Indoor      Neuro Re-ed    Neuro Re-ed Details  In standing, completed visual tracking with ball including diagonals and clockwise/counter clockwise circles, 1 x 2 minutes each. Patient only had mild reports off dizziness with circles to the R. Overall able to maintain balance with completion.                Balance Exercises - 03/13/20 0001      Balance Exercises: Standing   Standing Eyes Opened Narrow base of support (BOS);Head turns;Foam/compliant surface;Limitations    Standing Eyes Opened Limitations completed with EO, FT on Foam, Focused on completed horizontal/vertical head turns 1 x 15 reps.      Standing Eyes Closed Narrow base of support (BOS);Foam/compliant surface;3 reps;30 secs    Standing Eyes Closed Limitations completed 3 x 30 seconds with feet together,     Rockerboard Anterior/posterior;Lateral;EO;EC;Limitations    Rockerboard Limitations completed standing ant/post with EC 3 x 30-45 seconds, patient demo improved stability and able to hold longer duration with positioned ant/post. With board positioned laterally, patient able to hold with EO, 3 x 30 seconds, but when attempted with EC patient unable to maintain balance today.     Sidestepping Foam/compliant support;2 reps;Limitations    Sidestepping Limitations completed side stepping w/o UE support along blue balance beam, completed x 2 laps down and back.                PT Short Term Goals - 03/01/20 1354      PT SHORT TERM GOAL #1   Title Improve Berg balance score by at least 4 points for reduced fall risk.    Baseline BERG assessed on 02/17/20, scored 50/56.    Time 4    Period Weeks    Status Revised    Target Date 03/17/20      PT SHORT TERM GOAL #2   Title Pt will improve TUG score from 14.57 secs to </= 12 secs without use of SPC for reduced fall risk.    Baseline 14.57 secs - pt carried cane    Time 4    Period Weeks    Status New    Target Date 03/17/20      PT SHORT TERM GOAL #3   Title Pt will ambulate 500' without use of SPC on flat even surfaces with SBA for incr. community accessibility.    Baseline pt using cane - 100' at eval    Time 4    Period Weeks    Status New    Target Date 03/17/20      PT SHORT TERM GOAL #4   Title Pt will amb. 36' with horizontal head turns without device with supervision for safety with environmental scanning.    Time 4    Period Weeks    Status New    Target Date 03/17/20      PT SHORT TERM GOAL #5   Title Pt will participate in aquatic therapy after permission obtained from MD (craniotomy incision healed).    Time 4    Period Weeks    Status New     Target Date 03/17/20      PT SHORT TERM GOAL #6   Title Independent in balance HEP.    Time 4    Period Weeks    Status New    Target Date 03/17/20  PT SHORT TERM GOAL #7   Title Perform SOT & establish goal as appropriate.    Time 4    Period Weeks    Status New    Target Date 04/21/20             PT Long Term Goals - 03/01/20 1354      PT LONG TERM GOAL #1   Title Pt will improve FGA score by at least 8 points from baseline score for incr. safety with amb.    Baseline TBA    Time 8    Period Weeks    Status New      PT LONG TERM GOAL #2   Title Pt will improve Berg balance test by at least 10 points from baseline for reduced fall risk.    Time 8    Period Weeks    Status New      PT LONG TERM GOAL #3   Title Pt will amb. 1000' on flat even/uneven srufaces without device with supervision.    Time 8    Period Weeks    Status New      PT LONG TERM GOAL #4   Title Pt will be independent in updated HEP including aquatic exercises at time of D/C from PT.    Time 8    Period Weeks    Status New      PT LONG TERM GOAL #5   Title Pt will amb. 65' with head turns with no c/o dizziness and no imbalance for incr. safety with environmental scanning.    Time 8    Period Weeks    Status New                 Plan - 03/13/20 1018    Clinical Impression Statement Today's skilled PT sessio nfocused on continued balance training incorporating complaint surface and head turns to further challenge vestibular system. With visual tracking activity today, patietn reporting overall minimal dizziness. Patient will continue to benefit from skilled PT services to progress toward goals.    Personal Factors and Comorbidities Comorbidity 2;Transportation;Behavior Pattern;Comorbidity 3+    Comorbidities cranial nerve VII palsy, craniotomy on 01-27-20 for tumor resection, unilateral vestibular schwannoma, anxiety,, s/p Lt THR Dec. 2020    Examination-Activity Limitations  Transfers;Locomotion Level;Squat;Stairs;Stand;Carry    Examination-Participation Restrictions Meal Prep;Cleaning;Community Activity;Driving;Shop;Laundry;Interpersonal Relationship    Stability/Clinical Decision Making Evolving/Moderate complexity    Rehab Potential Good    PT Frequency 2x / week    PT Duration 8 weeks    PT Treatment/Interventions Aquatic Therapy;Gait training;Stair training;Therapeutic activities;Therapeutic exercise;Balance training;Neuromuscular re-education;Patient/family education;Vestibular    PT Next Visit Plan Check STGs. hip ABD strengthening, perform obstacles. Cont balance exs- SLS and LLE strengthening - hip and quad esp.  Continue functional BLE strengthening, standing balance on compliant surfaces with head turns, eyes closed. tandem balance. perform SOT when appropriate.    Consulted and Agree with Plan of Care Patient;Family member/caregiver    Family Member Consulted [name]           Patient will benefit from skilled therapeutic intervention in order to improve the following deficits and impairments:  Abnormal gait, Decreased balance, Decreased activity tolerance, Decreased coordination, Dizziness, Impaired vision/preception  Visit Diagnosis: Unsteadiness on feet  Other abnormalities of gait and mobility  Muscle weakness (generalized)  Dizziness and giddiness     Problem List Patient Active Problem List   Diagnosis Date Noted  . Cranial nerve VII palsy   . Hypoalbuminemia due to  protein-calorie malnutrition ([place])   . Acute blood loss anemia   . Sinus tachycardia   . Postoperative pain   . 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 Protein-calorie malnutrition, severe 01/30/2020  . Brain tumor ([place]) 01/27/2020  . Low folate 01/23/2020  . Anemia of chronic disease 01/23/2020  . Small bowel obstruction from retained endoscopy capsule at stricture s/p ileal resection 01/19/2020 01/18/2020  . 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  2020 09/07/2019  . 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 Primary localized osteoarthritis of left hip 01-Sep-2019  . Death of family member-  husband passed Jul 2020 04/29/2019  . 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 Sleep difficulties 09/09/2018  . Pre-diabetes 03/09/2018  . Postmenopausal state- went thru early 40's 01/02/2018  . Primary osteoarthritis of left hip 01/02/2018  . Caregiver burden- for husband with ALLeukemia 01/02/2018  . Left hip pain 01/02/2018  . Osteopenia 07/31/2016  . Hypertension 05/20/2011  . Menopausal state 05/20/2011    Jones Bales, PT, DPT 03/13/2020, 10:20 AM  [facility] [address] [place], [place], [address] Phone: [phone]   Fax:  [phone]  Name: Cristina Conner MRN: [id number] Date of Birth: [birth year]

## 2020-03-14 ENCOUNTER — Other Ambulatory Visit: Payer: Self-pay | Admitting: Family Medicine

## 2020-03-14 ENCOUNTER — Ambulatory Visit: Payer: BC Managed Care – PPO | Admitting: Speech Pathology

## 2020-03-14 ENCOUNTER — Encounter: Payer: BC Managed Care – PPO | Admitting: Occupational Therapy

## 2020-03-15 ENCOUNTER — Ambulatory Visit: Payer: BC Managed Care – PPO | Admitting: Physical Therapy

## 2020-03-15 ENCOUNTER — Ambulatory Visit: Payer: BC Managed Care – PPO

## 2020-03-15 ENCOUNTER — Ambulatory Visit: Payer: BC Managed Care – PPO | Admitting: Occupational Therapy

## 2020-03-15 ENCOUNTER — Other Ambulatory Visit: Payer: Self-pay

## 2020-03-15 DIAGNOSIS — R41841 Cognitive communication deficit: Secondary | ICD-10-CM | POA: Diagnosis not present

## 2020-03-15 DIAGNOSIS — R1311 Dysphagia, oral phase: Secondary | ICD-10-CM

## 2020-03-15 DIAGNOSIS — R42 Dizziness and giddiness: Secondary | ICD-10-CM

## 2020-03-15 DIAGNOSIS — R41840 Attention and concentration deficit: Secondary | ICD-10-CM

## 2020-03-15 DIAGNOSIS — M6281 Muscle weakness (generalized): Secondary | ICD-10-CM

## 2020-03-15 DIAGNOSIS — R2681 Unsteadiness on feet: Secondary | ICD-10-CM

## 2020-03-15 DIAGNOSIS — R41844 Frontal lobe and executive function deficit: Secondary | ICD-10-CM

## 2020-03-15 DIAGNOSIS — R2689 Other abnormalities of gait and mobility: Secondary | ICD-10-CM

## 2020-03-15 NOTE — Therapy (Signed)
[facility] [address] [place], [place], [address] Phone: [phone]   Fax:  [phone]  Speech Language Pathology Treatment  Patient Details  Name: Cristina Conner MRN: [id number] Date of Birth: 1960/10/18 Referring Provider (SLP): Dr. Alger Simons   Encounter Date: 03/15/2020   End of Session - 03/15/20 1021    Number of Visits 17    Date for SLP Re-Evaluation 04/12/20    SLP Start Time 0933    SLP Stop Time  1016    SLP Time Calculation (min) 43 min    Activity Tolerance Patient tolerated treatment well           Past Medical History:  Diagnosis Date  . Anxiety   . Arthritis   . Diverticulosis   . GERD (gastroesophageal reflux disease)   . Hypertension   . Osteopenia   . Post-operative nausea and vomiting    vomiting after general anesthesia per pt.  . Pre-diabetes     Past Surgical History:  Procedure Laterality Date  . CESAREAN SECTION     x 2  . COLONOSCOPY    . CRANIOTOMY Left 01/27/2020   Procedure: Left Craniotomy for Tumor Resection;  Surgeon: Judith Part, MD;  Location: [facility];  Service: Neurosurgery;  Laterality: Left;  . ENDOMETRIAL ABLATION    . ESOPHAGOGASTRODUODENOSCOPY  10/2019  . LAPAROSCOPY N/A 01/19/2020   Procedure: LAPAROSCOPY DIAGNOSTIC LAPAROTOMY WITH SMALL BOWEL RESECTION;  Surgeon: Ileana Roup, MD;  Location: [facility];  Service: General;  Laterality: N/A;  . PELVIC LAPAROSCOPY  1999   left salpingectomy, excision of Right, paratubal cyst  . PELVIC LAPAROSCOPY     laparoscopy with lysis of pelvic adhesions  . SHOULDER SURGERY  11/2010   "FROZEN SHOULDER"  . TOTAL HIP ARTHROPLASTY Left 08/17/2019   Procedure: LEFT TOTAL HIP ARTHROPLASTY ANTERIOR APPROACH;  Surgeon: Melrose Nakayama, MD;  Location: [facility];  Service: Orthopedics;  Laterality: Left;  . TUBAL LIGATION    . UPPER GASTROINTESTINAL ENDOSCOPY    . VENTRICULOSTOMY Left 01/27/2020   Procedure:  VENTRICULOSTOMY;  Surgeon: Judith Part, MD;  Location: [facility];  Service: Neurosurgery;  Laterality: Left;    There were no vitals filed for this visit.   Subjective Assessment - 03/15/20 0942    Subjective "My husband has been gone one year on Friday." (reason why pt didn't do homework - she had some preparation to do)                 ADULT SLP TREATMENT - 03/15/20 0948      General Information   Behavior/Cognition Alert;Cooperative;Pleasant mood      Treatment Provided   Treatment provided Cognitive-Linquistic      Cognitive-Linquistic Treatment   Treatment focused on Cognition;Patient/family/caregiver education    Skilled Treatment Pt describes "sparks of lapse of memories" at times which she routinely recalls 3-4 minutes later. SLP educated pt that this may likely be due to decr'd attention/focus at this time. SLP worked on divided attention task with written task while tracking auditory information. Pt was 100% accurate with auditory information as well as 100% with the written intormation. Alternating between auditory and written info was not exceedingly fluid. SLP provided pt with       Assessment / Recommendations / Plan   Plan Continue with current plan of care      Progression Toward Goals   Progression toward goals Progressing toward goals  SLP Short Term Goals - 03/15/20 1019      SLP SHORT TERM GOAL #1   Title Pt will complete HEP for oral dysphagia/ CN VII palsy with mod I    Status Partially Met      SLP SHORT TERM GOAL #2   Title Pt will follow swallow precautions and diet recommendation with mod I    Status Partially Met      SLP SHORT TERM GOAL #3   Title Pt will demonstrate anticipatory awareness on high level cognitive linguistic tasks in therapy and at home with rare min A    Status Achieved      SLP SHORT TERM GOAL #4   Title Pt will alternate attention on IADL tasks/high level congitive linguistic tasks with 95% accuracy  on each with rare min A    Time 1    Status Achieved            SLP Long Term Goals - 03/15/20 1020      SLP LONG TERM GOAL #1   Title Pt will utilize compensations for attention to successfully complete IADL's and high level congitive linguistic tasks with 90% accuracy and rare min A    Time 5    Period Weeks    Status On-going      SLP LONG TERM GOAL #2   Title Pt will generate 4 accomodations/compensations for attention to carryover in workplace with rare min A over 2 sessions    Time 5    Period Weeks    Status On-going      SLP LONG TERM GOAL #3   Title Pt will tolerate regular diet without avoiding less than 2 foods with rare min A over 2 sessions    Time 5    Period Weeks    Status On-going             Patient will benefit from skilled therapeutic intervention in order to improve the following deficits and impairments:   Dysphagia, oral phase  Cognitive communication deficit    Problem List Patient Active Problem List   Diagnosis Date Noted  . Cranial nerve VII palsy   . Hypoalbuminemia due to protein-calorie malnutrition ([facility])   . Acute blood loss anemia   . Sinus tachycardia   . Postoperative pain   . 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[facility]) 02/03/2020  . Protein-calorie malnutrition, severe 01/30/2020  . Brain tumor ([facility]) 01/27/2020  . Low folate 01/23/2020  . Anemia of chronic disease 01/23/2020  . Small bowel obstruction from retained endoscopy capsule at stricture s/p ileal resection 01/19/2020 01/18/2020  . 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 Primary localized osteoarthritis of left hip Aug 18, 2019  . Death of family member-  husband passed Jul 2020 04/29/2019  . 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 Sleep difficulties 09/09/2018  . Pre-diabetes 03/09/2018  . Postmenopausal state- went thru early 40's 01/02/2018  . Primary osteoarthritis of left hip 01/02/2018  . Caregiver burden- for husband with  ALLeukemia 01/02/2018  . Left hip pain 01/02/2018  . Osteopenia 07/31/2016  . Hypertension 05/20/2011  . Menopausal state 05/20/2011    [facility] ,[place], [place]  03/15/2020, 10:21 AM  [facility] [address] [place], [place], [address] Phone: [phone]   Fax:  [phone]   Name: Cristina Conner MRN: [id number] Date of Birth: 10/31/1960

## 2020-03-15 NOTE — Patient Instructions (Addendum)
Access Code: [email or web address] URL: [email or web address] Date: 03/15/2020 Prepared by: Baldomero Lamy  Exercises Standing Marching - 2 x daily - 5 x weekly - 3 sets Standing Tandem Balance with Counter Support - 2 x daily - 5 x weekly - 3 sets - 15-20 hold Standing Hip Abduction with Counter Support - 1 x daily - 5 x weekly - 2 sets - 10 reps Standing with Head Rotation - 1 x daily - 5 x weekly - 2 sets - 10 reps Standing Balance with Eyes Closed on Foam - 1 x daily - 5 x weekly - 3 sets - 20-30 hold Single Leg Stance - 1 x daily - 5 x weekly - 1 sets - 3 reps - 30 hold

## 2020-03-15 NOTE — Therapy (Signed)
[facility] [address] [place], [place], [address] Phone: [phone]   Fax:  [phone]  Physical Therapy Treatment  Patient Details  Name: Cristina Conner MRN: [id number] Date of Birth: 10/18/1960 Referring Provider (PT): Pamela Love, PA-C   Encounter Date: 03/15/2020   PT End of Session - 03/15/20 0808    Visit Number 10    Number of Visits 17    Date for PT Re-Evaluation 04/14/20    Authorization Type [employer] - [place]    PT Start Time 0802    PT Stop Time 0845    PT Time Calculation (min) 43 min    Equipment Utilized During Treatment Gait belt    Activity Tolerance Patient tolerated treatment well    Behavior During Therapy WFL for tasks assessed/performed           Past Medical History:  Diagnosis Date  . Anxiety   . Arthritis   . Diverticulosis   . GERD (gastroesophageal reflux disease)   . Hypertension   . Osteopenia   . Post-operative nausea and vomiting    vomiting after general anesthesia per pt.  . Pre-diabetes     Past Surgical History:  Procedure Laterality Date  . CESAREAN SECTION     x 2  . COLONOSCOPY    . CRANIOTOMY Left 01/27/2020   Procedure: Left Craniotomy for Tumor Resection;  Surgeon: Ostergard, Thomas A, MD;  [facility];  Service: Neurosurgery;  Laterality: Left;  . ENDOMETRIAL ABLATION    . ESOPHAGOGASTRODUODENOSCOPY  10/2019  . LAPAROSCOPY N/A 01/19/2020   Procedure: LAPAROSCOPY DIAGNOSTIC LAPAROTOMY WITH SMALL BOWEL RESECTION;  Surgeon: White, Christopher M, MD;  Location: [facility];  Service: General;  Laterality: N/A;  . PELVIC LAPAROSCOPY  1999   left salpingectomy, excision of Right, paratubal cyst  . PELVIC LAPAROSCOPY     laparoscopy with lysis of pelvic adhesions  . SHOULDER SURGERY  11/2010   "FROZEN SHOULDER"  . TOTAL HIP ARTHROPLASTY Left 08/17/2019   Procedure: LEFT TOTAL HIP ARTHROPLASTY ANTERIOR APPROACH;  Surgeon: Dalldorf, Peter, MD;  Location[facility];   Service: Orthopedics;  Laterality: Left;  . TUBAL LIGATION    . UPPER GASTROINTESTINAL ENDOSCOPY    . VENTRICULOSTOMY Left 01/27/2020   Procedure: VENTRICULOSTOMY;  Surgeon: Ostergard, Thomas A, MD;  [facility];  Service: Neurosurgery;  Laterality: Left;    There were no vitals filed for this visit.   Subjective Assessment - 03/15/20 0806    Subjective Patient reports contact still going well. No new issues/complaints to report. No pain.    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 No/denies                             [facility] Adult PT Treatment/Exercise - 03/15/20 0810      Ambulation/Gait   Ambulation/Gait Yes    Ambulation/Gait Assistance 6: Modified independent (Device/Increase time)    Ambulation/Gait Assistance Details completed ambulation x 590' without AD at Mod I. Patient demo no instances of imbalance during ambulation    Assistive device None      Gait Pattern Step-through pattern;Decreased arm swing - left    Ambulation Surface Level;Indoor    Gait Comments Completed gait x 115' with horizontal head turns and scanning environemnt, able to complete at supervision level.       Standardized Balance Assessment   Standardized Balance Assessment Berg Balance Test;Timed Up and Go Test      Berg Balance Test   Sit to Stand Able to stand without using hands and stabilize independently    Standing Unsupported Able to stand safely 2 minutes    Sitting with Back Unsupported but Feet Supported on Floor or Stool Able to sit safely and securely 2 minutes    Stand to Sit Sits safely with minimal use of hands    Transfers Able to transfer  safely, minor use of hands    Standing Unsupported with Eyes Closed Able to stand 10 seconds safely    Standing Ubsupported with Feet Together Able to place feet together independently and stand 1 minute safely    From Standing, Reach Forward with Outstretched Arm Can reach confidently >25 cm (10")    From Standing Position, Pick up Object from Floor Able to pick up shoe safely and easily    From Standing Position, Turn to Look Behind Over each Shoulder Looks behind from both sides and weight shifts well    Turn 360 Degrees Able to turn 360 degrees safely in 4 seconds or less    Standing Unsupported, Alternately Place Feet on Step/Stool Able to stand independently and complete 8 steps >20 seconds    Standing Unsupported, One Foot in Front Able to plae foot ahead of the other independently and hold 30 seconds    Standing on One Leg Able to lift leg independently and hold 5-10 seconds    Total Score 53      Timed Up and Go Test   TUG Normal TUG    Normal TUG (seconds) 9.62   w/o AD              Balance Exercises - 03/15/20 0001      Balance Exercises: Standing   Tandem Stance Eyes open;3 reps;30 secs;Eyes closed;Limitations    Tandem Stance Time attempted with EC, but with vision removed patient demo imbalance and require CGA/intermittent UE support.     SLS Eyes open;Solid surface;3 reps;Time    SLS Time 20-30 secs    SLS Limitations intermittent UE support required with stance on LLE due to weakness             PT Education - 03/15/20 0853    Education Details educated on progress toward STG's, and on HEP update    Person(s) Educated Patient    Methods Explanation;Demonstration;Handout    Comprehension Verbalized understanding;Returned demonstration            PT Short Term Goals - 03/15/20 0809      PT SHORT TERM GOAL #1   Title Improve Berg balance score by at least 4 points for reduced fall risk.    Baseline BERG assessed on 02/17/20, scored 50/56; 53/56 on  03/15/20    Time 4    Period Weeks    Status On-going    Target Date 03/17/20      PT SHORT TERM GOAL #2   Title Pt will improve TUG score from 14.57 secs to </= 12 secs without use of SPC for reduced fall risk.    Baseline 14.57 secs - pt carried cane, 9.62 w/o AD      Time 4    Period Weeks    Status Achieved    Target Date 03/17/20      PT SHORT TERM GOAL #3   Title Pt will ambulate 500' without use of SPC on flat even surfaces with SBA for incr. community accessibility.    Baseline 59o' w/ no AD, Mod I    Time 4    Period Weeks    Status Achieved    Target Date 03/17/20      PT SHORT TERM GOAL #4   Title Pt will amb. 40' with horizontal head turns without device with supervision for safety with environmental scanning.    Baseline 115' w/ horizontal head turns, no AD, supervision level    Time 4    Period Weeks    Status Achieved    Target Date 03/17/20      PT SHORT TERM GOAL #5   Title Pt will participate in aquatic therapy after permission obtained from MD (craniotomy incision healed).    Baseline not initiated aquatic therapy at this time    Time 4    Period Weeks    Status On-going    Target Date 03/17/20      PT SHORT TERM GOAL #6   Title Independent in balance HEP.    Baseline Reports independence with exercises, only completing 4/7 days of week    Time 4    Period Weeks    Status Partially Met    Target Date 03/17/20      PT SHORT TERM GOAL #7   Title Perform SOT & establish goal as appropriate.    Time 4    Period Weeks    Status New    Target Date 04/21/20             PT Long Term Goals - 03/01/20 1354      PT LONG TERM GOAL #1   Title Pt will improve FGA score by at least 8 points from baseline score for incr. safety with amb.    Baseline TBA    Time 8    Period Weeks    Status New      PT LONG TERM GOAL #2   Title Pt will improve Berg balance test by at least 10 points from baseline for reduced fall risk.    Time 8    Period Weeks     Status New      PT LONG TERM GOAL #3   Title Pt will amb. 1000' on flat even/uneven srufaces without device with supervision.    Time 8    Period Weeks    Status New      PT LONG TERM GOAL #4   Title Pt will be independent in updated HEP including aquatic exercises at time of D/C from PT.    Time 8    Period Weeks    Status New      PT LONG TERM GOAL #5   Title Pt will amb. 50' with head turns with no c/o dizziness and no imbalance for incr. safety with environmental scanning.    Time 8    Period Weeks    Status New                 Plan - 03/15/20 0856    Clinical Impression Statement Today's session included assessment of patient's progress toward STG's. Patient has demonstrated significant progress with therapy meeting multiple STG today. SOT still has not been performed at this time,   and aquatic therapy has not been initiated. PT is working on getting patient into aquatic therapy at this time. Overall patient demosntrates improved balance with Berg Balance of 53/56, and imporved gait with scanning environment. Patient will continue to benefit from skilled PT services to progress toward all unmet goals.    Personal Factors and Comorbidities Comorbidity 2;Transportation;Behavior Pattern;Comorbidity 3+    Comorbidities cranial nerve VII palsy, craniotomy on 01-27-20 for tumor resection, unilateral vestibular schwannoma, anxiety,, s/p Lt THR Dec. 2020    Examination-Activity Limitations Transfers;Locomotion Level;Squat;Stairs;Stand;Carry    Examination-Participation Restrictions Meal Prep;Cleaning;Community Activity;Driving;Shop;Laundry;Interpersonal Relationship    Stability/Clinical Decision Making Evolving/Moderate complexity    Rehab Potential Good    PT Frequency 2x / week    PT Duration 8 weeks    PT Treatment/Interventions Aquatic Therapy;Gait training;Stair training;Therapeutic activities;Therapeutic exercise;Balance training;Neuromuscular re-education;Patient/family  education;Vestibular    PT Next Visit Plan Perform SOT. hip ABD strengthening, perform obstacles. Cont balance exs- SLS and LLE strengthening - hip and quad esp.  Continue functional BLE strengthening, standing balance on compliant surfaces with head turns, eyes closed. tandem balance. Any update on aquatics from [facility]?    Consulted and Agree with Plan of Care Patient;Family member/caregiver    Family Member Consulted [name]           Patient will benefit from skilled therapeutic intervention in order to improve the following deficits and impairments:  Abnormal gait, Decreased balance, Decreased activity tolerance, Decreased coordination, Dizziness, Impaired vision/preception  Visit Diagnosis: Unsteadiness on feet  Other abnormalities of gait and mobility  Muscle weakness (generalized)  Dizziness and giddiness     Problem List Patient Active Problem List   Diagnosis Date Noted  . Cranial nerve VII palsy   . Hypoalbuminemia due to protein-calorie malnutrition ([facility])   . Acute blood loss anemia   . Sinus tachycardia   . Postoperative pain   . 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[facility]) 02/03/2020  . Protein-calorie malnutrition, severe 01/30/2020  . Brain tumor ([facility]) 01/27/2020  . Low folate 01/23/2020  . Anemia of chronic disease 01/23/2020  . Small bowel obstruction from retained endoscopy capsule at stricture s/p ileal resection 01/19/2020 01/18/2020  . 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 Primary localized osteoarthritis of left hip 08/27/2019  . Death of family member-  husband passed Jul 2020 04/29/2019  . 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 Sleep difficulties 09/09/2018  . Pre-diabetes 03/09/2018  . Postmenopausal state- went thru early 40's 01/02/2018  . Primary osteoarthritis of left hip 01/02/2018  . Caregiver burden- for husband with ALLeukemia 01/02/2018  . Left hip pain 01/02/2018  . Osteopenia 07/31/2016  .  Hypertension 05/20/2011  . Menopausal state 05/20/2011    Jones Bales, PT, DPT 03/15/2020, 9:06 AM  [facility] [address] [place], [place], [address] Phone: [phone]   Fax:  [phone]  Name: SHALIA BARTKO MRN: [id number] Date of Birth: [birth year]

## 2020-03-15 NOTE — Therapy (Signed)
[facility] [address] [place], [place], [address] Phone: [phone]   Fax:  [phone]  Occupational Therapy Treatment  Patient Details  Name: Cristina Conner MRN: [id number] Date of Birth: 1960-10-28 Referring Provider (OT): Dr. Alger Simons   Encounter Date: 03/15/2020   OT End of Session - 03/15/20 1223    Visit Number 5    Number of Visits 25    Date for OT Re-Evaluation 05/15/20    Authorization Type [employer] covered 100%, no visit limit    OT Start Time 0845    OT Stop Time 0930    OT Time Calculation (min) 45 min    Activity Tolerance Patient tolerated treatment well           Past Medical History:  Diagnosis Date  . Anxiety   . Arthritis   . Diverticulosis   . GERD (gastroesophageal reflux disease)   . Hypertension   . Osteopenia   . Post-operative nausea and vomiting    vomiting after general anesthesia per pt.  . Pre-diabetes     Past Surgical History:  Procedure Laterality Date  . CESAREAN SECTION     x 2  . COLONOSCOPY    . CRANIOTOMY Left 01/27/2020   Procedure: Left Craniotomy for Tumor Resection;  Surgeon: Judith Part, MD;  Location: [facility];  Service: Neurosurgery;  Laterality: Left;  . ENDOMETRIAL ABLATION    . ESOPHAGOGASTRODUODENOSCOPY  10/2019  . LAPAROSCOPY N/A 01/19/2020   Procedure: LAPAROSCOPY DIAGNOSTIC LAPAROTOMY WITH SMALL BOWEL RESECTION;  Surgeon: Ileana Roup, MD;  Location: [facility];  Service: General;  Laterality: N/A;  . PELVIC LAPAROSCOPY  1999   left salpingectomy, excision of Right, paratubal cyst  . PELVIC LAPAROSCOPY     laparoscopy with lysis of pelvic adhesions  . SHOULDER SURGERY  11/2010   "FROZEN SHOULDER"  . TOTAL HIP ARTHROPLASTY Left 08/17/2019   Procedure: LEFT TOTAL HIP ARTHROPLASTY ANTERIOR APPROACH;  Surgeon: Melrose Nakayama, MD;  Location: [facility];  Service: Orthopedics;  Laterality: Left;  . TUBAL LIGATION    . UPPER GASTROINTESTINAL  ENDOSCOPY    . VENTRICULOSTOMY Left 01/27/2020   Procedure: VENTRICULOSTOMY;  Surgeon: Judith Part, MD;  Location: [facility];  Service: Neurosurgery;  Laterality: Left;    There were no vitals filed for this visit.   Subjective Assessment - 03/15/20 0848    Pertinent History Unilateral vestibular schwannoma, s/p craniotomy for tumor resection 01/27/20.  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 decr hearing L side    Limitations fall risk, L eye patched due to inability to close eye    Patient Stated Goals wants to be able to drive, improve balance, perform financial management, be able to take care of mother    Currently in Pain? No/denies          Pt performing environmental scanning with 10/12 items found on first pass, remaining 2 found on 2nd pass w/ min cues.   Money exchange worksheet with 2 errors in result and cued to correct errors. Pt then added additional coin in 1 row, and 2 rows got coins in wrong place (d/t vision).  Simple financial management task: filling out register for 5 debits and 2 deposits correctly, and first 3 calculations done correctly with use of  calculator, however pt off by 1 in tens place which threw remaining balance off. Pt also made 1 other error in cents spot                         OT Short Term Goals - 03/15/20 1223      OT SHORT TERM GOAL #1   Title Pt will be independent with initial HEP for LUE coordination and strength.--check STGs 03/31/20    Time 6    Period Weeks    Status Achieved    Target Date 03/31/20      OT SHORT TERM GOAL #2   Title Pt will improve L hand coordination for ADLs as shown by improving time on 9-hole peg test by at least 12sec.    Baseline  40.40sec    Time 6    Period Weeks    Status New      OT SHORT TERM GOAL #3   Title Pt will be able to perform tabletop scanning with at least 95% accuracy for incr attention and IADL tasks.    Time 6    Period Weeks    Status New      OT SHORT TERM GOAL #4   Title Pt will perform simple home maintenance tasks mod I.    Time 6    Period Weeks    Status Achieved   03/07/20:  sweeping, laundry, dishes, wiping counters     OT SHORT TERM GOAL #5   Title Pt will perform simple financial management tasks with min A.    Time 6    Period Weeks    Status New             OT Long Term Goals - 02/15/20 1532      OT LONG TERM GOAL #1   Title Pt will be independent with updated HEP.--check LTGs 05/15/20    Time 12    Period Weeks    Status New    Target Date 05/15/20      OT LONG TERM GOAL #2   Title Pt will improve L grip strength by at least 8lbs to assist with home maintenance tasks/in prep for yardwork.    Time 12    Period Weeks    Status New      OT LONG TERM GOAL #3   Title Pt will demo improved balance and strength to be able to water plants.    Time 12    Period Weeks    Status New      OT LONG TERM GOAL #4   Title Pt will perform environmental scanning with at least 90% accuracy for incr safety for community activities.    Time 12    Period Weeks    Status New      OT LONG TERM GOAL #5   Title Pt will perform previous financial management tasks with supervision.    Time 12    Period Weeks    Status New      Long Term Additional Goals   Additional Long Term Goals Yes      OT LONG TERM GOAL #6   Title Pt will be able to alternate attention between at least 1 physical and 1 cognitive task with at least 90% accuracy for incr safety with IADLs.    Time 12    Period Weeks    Status New  Plan - 03/15/20 1224    Clinical Impression Statement Pt progressing towards goals.    Occupational performance deficits (Please refer to evaluation  for details): ADL's;IADL's;Work;Leisure;Other    Body Structure / Function / Physical Skills ADL;Hearing;Vision;IADL;Balance;Mobility;Strength;Coordination;FMC;UE functional use    Cognitive Skills Attention;Memory;Perception;Thought    Rehab Potential Good    OT Frequency 2x / week    OT Duration 12 weeks    OT Treatment/Interventions Self-care/ADL training;DME and/or AE instruction;Therapeutic activities;Aquatic Therapy;Therapeutic exercise;Cognitive remediation/compensation;Visual/perceptual remediation/compensation;Functional Mobility Training;Neuromuscular education;Patient/family education    Plan continue progress towards goals    Consulted and Agree with Plan of Care Patient           Patient will benefit from skilled therapeutic intervention in order to improve the following deficits and impairments:   Body Structure / Function / Physical Skills: ADL, Hearing, Vision, IADL, Balance, Mobility, Strength, Coordination, FMC, UE functional use Cognitive Skills: Attention, Memory, Perception, Thought     Visit Diagnosis: Frontal lobe and executive function deficit  Attention and concentration deficit    Problem List Patient Active Problem List   Diagnosis Date Noted  . Cranial nerve VII palsy   . Hypoalbuminemia due to protein-calorie malnutrition ([place])   . Acute blood loss anemia   . Sinus tachycardia   . Postoperative pain   . 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[place]) 02/03/2020  . Protein-calorie malnutrition, severe 01/30/2020  . Brain tumor ([place]) 01/27/2020  . Low folate 01/23/2020  . Anemia of chronic disease 01/23/2020  . Small bowel obstruction from retained endoscopy capsule at stricture s/p ileal resection 01/19/2020 01/18/2020  . 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 Primary localized osteoarthritis of left hip 2019-09-08  . Death of family member-  husband passed Jul 2020 04/29/2019  . Reactive depression  04/01/2019  . Sleep difficulties 09/09/2018  . Pre-diabetes 03/09/2018  . Postmenopausal state- went thru early 40's 01/02/2018  . Primary osteoarthritis of left hip 01/02/2018  . Caregiver burden- for husband with ALLeukemia 01/02/2018  . Left hip pain 01/02/2018  . Osteopenia 07/31/2016  . Hypertension 05/20/2011  . Menopausal state 05/20/2011    Carey Bullocks, OTR/L 03/15/2020, 12:25 PM  [facility] [address] [place], [place], [address] Phone: [phone]   Fax:  [phone]  Name: Cristina Conner MRN: [id number] Date of Birth: [birth year]

## 2020-03-20 ENCOUNTER — Ambulatory Visit: Payer: BC Managed Care – PPO

## 2020-03-20 ENCOUNTER — Ambulatory Visit: Payer: BC Managed Care – PPO | Admitting: Speech Pathology

## 2020-03-20 ENCOUNTER — Ambulatory Visit: Payer: BC Managed Care – PPO | Admitting: Occupational Therapy

## 2020-03-20 ENCOUNTER — Encounter: Payer: Self-pay | Admitting: Speech Pathology

## 2020-03-20 ENCOUNTER — Encounter: Payer: Self-pay | Admitting: Occupational Therapy

## 2020-03-20 ENCOUNTER — Other Ambulatory Visit: Payer: Self-pay

## 2020-03-20 DIAGNOSIS — R2689 Other abnormalities of gait and mobility: Secondary | ICD-10-CM

## 2020-03-20 DIAGNOSIS — M6281 Muscle weakness (generalized): Secondary | ICD-10-CM

## 2020-03-20 DIAGNOSIS — R278 Other lack of coordination: Secondary | ICD-10-CM

## 2020-03-20 DIAGNOSIS — R41840 Attention and concentration deficit: Secondary | ICD-10-CM

## 2020-03-20 DIAGNOSIS — R41841 Cognitive communication deficit: Secondary | ICD-10-CM

## 2020-03-20 DIAGNOSIS — R2681 Unsteadiness on feet: Secondary | ICD-10-CM

## 2020-03-20 DIAGNOSIS — R41844 Frontal lobe and executive function deficit: Secondary | ICD-10-CM

## 2020-03-20 DIAGNOSIS — R42 Dizziness and giddiness: Secondary | ICD-10-CM

## 2020-03-20 DIAGNOSIS — R1311 Dysphagia, oral phase: Secondary | ICD-10-CM

## 2020-03-20 NOTE — Patient Instructions (Signed)
  Amado Nash - SLP at [facility] who specializes in [place]. It may be worth calling her and explaining your CN VII palsy and see what she says [phone]; [phone]  Go to a restaurant and scout out a corner seat where you can face in

## 2020-03-20 NOTE — Therapy (Signed)
[facility] [address] [place], [place], [address] Phone: [phone]   Fax:  [phone]  Occupational Therapy Treatment  Patient Details  Name: Cristina Conner MRN: [id number] Date of Birth: 1960/10/12 Referring Provider (OT): Dr. Alger Simons   Encounter Date: 03/20/2020   OT End of Session - 03/20/20 0941    Visit Number 6    Number of Visits 25    Date for OT Re-Evaluation 05/15/20    Authorization Type [employer] covered 100%, no visit limit    OT Start Time 0936    OT Stop Time 1015    OT Time Calculation (min) 39 min    Activity Tolerance Patient tolerated treatment well    Behavior During Therapy [facility] for tasks assessed/performed           Past Medical History:  Diagnosis Date  . Anxiety   . Arthritis   . Diverticulosis   . GERD (gastroesophageal reflux disease)   . Hypertension   . Osteopenia   . Post-operative nausea and vomiting    vomiting after general anesthesia per pt.  . Pre-diabetes     Past Surgical History:  Procedure Laterality Date  . CESAREAN SECTION     x 2  . COLONOSCOPY    . CRANIOTOMY Left 01/27/2020   Procedure: Left Craniotomy for Tumor Resection;  Surgeon: Judith Part, MD;  Location: [facility];  Service: Neurosurgery;  Laterality: Left;  . ENDOMETRIAL ABLATION    . ESOPHAGOGASTRODUODENOSCOPY  10/2019  . LAPAROSCOPY N/A 01/19/2020   Procedure: LAPAROSCOPY DIAGNOSTIC LAPAROTOMY WITH SMALL BOWEL RESECTION;  Surgeon: Ileana Roup, MD;  [facility];  Service: General;  Laterality: N/A;  . PELVIC LAPAROSCOPY  1999   left salpingectomy, excision of Right, paratubal cyst  . PELVIC LAPAROSCOPY     laparoscopy with lysis of pelvic adhesions  . SHOULDER SURGERY  11/2010   "FROZEN SHOULDER"  . TOTAL HIP ARTHROPLASTY Left 08/17/2019   Procedure: LEFT TOTAL HIP ARTHROPLASTY ANTERIOR APPROACH;  Surgeon: Melrose Nakayama, MD;  [facility];  Service: Orthopedics;   Laterality: Left;  . TUBAL LIGATION    . UPPER GASTROINTESTINAL ENDOSCOPY    . VENTRICULOSTOMY Left 01/27/2020   Procedure: VENTRICULOSTOMY;  Surgeon: Judith Part, MD;  Location: [facility];  Service: Neurosurgery;  Laterality: Left;    There were no vitals filed for this visit.   Subjective Assessment - 03/20/20 0938    Subjective  Eye appt Thursday.  Pt rpeorts that she went to grocery store with with dtr., but got all groceries, loaded/unloaded groceries (with help) and paid.  Made a casserole (boil chicken, cut chicken, mixed, put in/out of oven), but didn't cut oven.  Pt also cooked grits, bacon, hashbrowns.    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 decr hearing L side    Limitations fall risk, L eye patched due to inability to close eye    Patient Stated Goals wants to be able to drive, improve balance, perform financial management, be able to take care of mother    Currently in Pain? No/denies            Discussed progress with IADLs.  Pt reports that dtr is going  out of town Thurs-Monday.  Mother-in-law available to assist prn.    Completing [place] Pegboard for incr coordination with L hand, min difficulty.  Then removing using in-hand manipulation with min difficulty.  Constant therapy:  Alternating symbol match with 98% accuracy for alternating attention with 86.88sec response time, level 6.  Every Day Math with 100% accuracy and 71.82sec average response time, level 1.  Pt reports that she went through her mother's mail and has been doing Doctor, hospital with supervision initially, but now mod I.        OT Short  Term Goals - 03/15/20 1223      OT SHORT TERM GOAL #1   Title Pt will be independent with initial HEP for LUE coordination and strength.--check STGs 03/31/20    Time 6    Period Weeks    Status Achieved    Target Date 03/31/20      OT SHORT TERM GOAL #2   Title Pt will improve L hand coordination for ADLs as shown by improving time on 9-hole peg test by at least 12sec.    Baseline 40.40sec    Time 6    Period Weeks    Status New      OT SHORT TERM GOAL #3   Title Pt will be able to perform tabletop scanning with at least 95% accuracy for incr attention and IADL tasks.    Time 6    Period Weeks    Status New      OT SHORT TERM GOAL #4   Title Pt will perform simple home maintenance tasks mod I.    Time 6    Period Weeks    Status Achieved   03/07/20:  sweeping, laundry, dishes, wiping counters     OT SHORT TERM GOAL #5   Title Pt will perform simple financial management tasks with min A.    Time 6    Period Weeks    Status New             OT Long Term Goals - 02/15/20 1532      OT LONG TERM GOAL #1   Title Pt will be independent with updated HEP.--check LTGs 05/15/20    Time 12    Period Weeks    Status New    Target Date 05/15/20      OT LONG TERM GOAL #2   Title Pt will improve L grip strength by at least 8lbs to assist with home maintenance tasks/in prep for yardwork.    Time 12    Period Weeks    Status New      OT LONG TERM GOAL #3   Title Pt will demo improved balance and strength to be able to water plants.    Time 12    Period Weeks    Status New      OT LONG TERM GOAL #4   Title Pt will perform environmental scanning with at least 90% accuracy for incr safety for community activities.    Time 12    Period Weeks    Status New      OT LONG TERM GOAL #5   Title Pt will perform previous financial management tasks with supervision.    Time 12    Period Weeks    Status New      Long Term Additional Goals   Additional Long Term Goals Yes      OT  LONG TERM GOAL #6   Title Pt will be  able to alternate attention between at least 1 physical and 1 cognitive task with at least 90% accuracy for incr safety with IADLs.    Time 12    Period Weeks    Status New                 Plan - 03/20/20 0942    Clinical Impression Statement Pt progressing towards goals.  Pt reports incr independence with IADL tasks.    Occupational performance deficits (Please refer to evaluation for details): ADL's;IADL's;Work;Leisure;Other    Body Structure / Function / Physical Skills ADL;Hearing;Vision;IADL;Balance;Mobility;Strength;Coordination;FMC;UE functional use    Cognitive Skills Attention;Memory;Perception;Thought    Rehab Potential Good    OT Frequency 2x / week    OT Duration 12 weeks    OT Treatment/Interventions Self-care/ADL training;DME and/or AE instruction;Therapeutic activities;Aquatic Therapy;Therapeutic exercise;Cognitive remediation/compensation;Visual/perceptual remediation/compensation;Functional Mobility Training;Neuromuscular education;Patient/family education    Plan continue progress towards goals; dynamic balance (simulate watering plants), visual scanning, attention/financial management.    Consulted and Agree with Plan of Care Patient           Patient will benefit from skilled therapeutic intervention in order to improve the following deficits and impairments:   Body Structure / Function / Physical Skills: ADL, Hearing, Vision, IADL, Balance, Mobility, Strength, Coordination, FMC, UE functional use Cognitive Skills: Attention, Memory, Perception, Thought     Visit Diagnosis: Other lack of coordination  Frontal lobe and executive function deficit  Attention and concentration deficit  Unsteadiness on feet  Muscle weakness (generalized)  Other abnormalities of gait and mobility    Problem List Patient Active Problem List   Diagnosis Date Noted  . Cranial nerve VII palsy   . Hypoalbuminemia due to  protein-calorie malnutrition ([place])   . Acute blood loss anemia   . Sinus tachycardia   . Postoperative pain   . 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[place]) 02/03/2020  . Protein-calorie malnutrition, severe 01/30/2020  . Brain tumor ([place]) 01/27/2020  . Low folate 01/23/2020  . Anemia of chronic disease 01/23/2020  . Small bowel obstruction from retained endoscopy capsule at stricture s/p ileal resection 01/19/2020 01/18/2020  . 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 Primary localized osteoarthritis of left hip 2019/09/09  . Death of family member-  husband passed Jul 2020 04/29/2019  . 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 Sleep difficulties 09/09/2018  . Pre-diabetes 03/09/2018  . Postmenopausal state- went thru early 40's 01/02/2018  . Primary osteoarthritis of left hip 01/02/2018  . Caregiver burden- for husband with ALLeukemia 01/02/2018  . Left hip pain 01/02/2018  . Osteopenia 07/31/2016  . Hypertension 05/20/2011  . Menopausal state 05/20/2011    [facility] 03/20/2020, 10:49 AM  [facility] [address] [place], [place], [address] Phone: [phone]   Fax:  [phone]  Name: SHRESTA RISDEN MRN: [id number] Date of Birth: 1961/02/15   Vianne Bulls, OTR/L [facility] [address] [place], [place]  [address] [phone] phone [phone] 03/20/20 10:49 AM

## 2020-03-20 NOTE — Therapy (Signed)
[facility] [address] [place], [place], [address] Phone: [phone]   Fax:  [phone]  Physical Therapy Treatment  Patient Details  Name: Cristina Conner MRN: [id number] Date of Birth: 03/31/1961 Referring Provider (PT): Reesa Chew, [place]   Encounter Date: 03/20/2020   PT End of Session - 03/20/20 0801    Visit Number 11    Number of Visits 17    Date for PT Re-Evaluation 04/14/20    Authorization Type [employer] - [place]    PT Start Time 0800    PT Stop Time 0844    PT Time Calculation (min) 44 min    Equipment Utilized During Treatment Gait belt    Activity Tolerance Patient tolerated treatment well    Behavior During Therapy WFL for tasks assessed/performed           Past Medical History:  Diagnosis Date   Anxiety    Arthritis    Diverticulosis    GERD (gastroesophageal reflux disease)    Hypertension    Osteopenia    Post-operative nausea and vomiting    vomiting after general anesthesia per pt.   Pre-diabetes     Past Surgical History:  Procedure Laterality Date   CESAREAN SECTION     x 2   COLONOSCOPY     CRANIOTOMY Left 01/27/2020   Procedure: Left Craniotomy for Tumor Resection;  Surgeon: Judith Part, MD;  Location: [facility];  Service: Neurosurgery;  Laterality: Left;   ENDOMETRIAL ABLATION     ESOPHAGOGASTRODUODENOSCOPY  10/2019   LAPAROSCOPY N/A 01/19/2020   Procedure: LAPAROSCOPY DIAGNOSTIC LAPAROTOMY WITH SMALL BOWEL RESECTION;  Surgeon: Ileana Roup, MD;  Location: [facility];  Service: General;  Laterality: N/A;   PELVIC LAPAROSCOPY  1999   left salpingectomy, excision of Right, paratubal cyst   PELVIC LAPAROSCOPY     laparoscopy with lysis of pelvic adhesions   SHOULDER SURGERY  11/2010   "FROZEN SHOULDER"   TOTAL HIP ARTHROPLASTY Left 08/17/2019   Procedure: LEFT TOTAL HIP ARTHROPLASTY ANTERIOR APPROACH;  Surgeon: Melrose Nakayama, MD;  Location: [facility];   Service: Orthopedics;  Laterality: Left;   TUBAL LIGATION     UPPER GASTROINTESTINAL ENDOSCOPY     VENTRICULOSTOMY Left 01/27/2020   Procedure: VENTRICULOSTOMY;  Surgeon: Judith Part, MD;  Location: [facility];  Service: Neurosurgery;  Laterality: Left;    There were no vitals filed for this visit.   Subjective Assessment - 03/20/20 0801    Subjective Patient reports no new issues/complaints. Patient reports that she has been practicing her balance exercises at home. mild pain in the L forehead.    Pertinent History tachycardia, HTN after lapaaroscopic sx on 01-18-20, Lt THR on 08-17-19, anxiety    Diagnostic tests CT revealed hydrocephalus with tumor on Lt side 4th ventricle ;  MRI revealed Lt cerebellar pontine mass compatible with vestibular schwannoma    Patient Stated Goals get back to doing [employer] - would like to get back to work at [employer]    Currently in Pain? Yes    Pain Score 4     Pain Location Head   forehead   Pain Orientation Left    Pain Descriptors / Indicators Shooting    Pain Type Acute pain    Pain Onset More than a month ago    Pain Frequency Intermittent  [facility] Adult PT Treatment/Exercise - 03/20/20 0001      Ambulation/Gait   Ambulation/Gait Yes    Ambulation/Gait Assistance 6: Modified independent (Device/Increase time)    Ambulation/Gait Assistance Details completed ambulation throughout therapy gym with activities    Assistive device None    Gait Pattern Step-through pattern;Decreased arm swing - left    Ambulation Surface Level;Indoor      High Level Balance   High Level Balance Activities Head turns;Negotiating over obstacles    High Level Balance Comments Completed obstacle course around gym including stepping over orange hurdles followed by horizontal/vertical head turns 3 laps x 115 ft each. PT providing supervision. Only mild dizziness with head turns to R, rated 1/10 dizziness. Patient reports enough to  notice but very mild.       Neuro Re-ed    Neuro Re-ed Details  Standing on red foam, completed side stepping with alternating toe tap to cone with light UE support. Completed x 4 laps down and back on red mat. Intermittent CGA as needed.                Balance Exercises - 03/20/20 0001      Balance Exercises: Standing   Tandem Stance Eyes open;Foam/compliant surface;Intermittent upper extremity support    Tandem Stance Time completed initially on firm surface with patient able to hold full 30 seconds on BLE. progressed to complaint surface with patient able to hold 20-30 secs with intermittent UE support.     SLS Eyes open;Solid surface;Foam/compliant surface    SLS Time 10-30    SLS Limitations completed initially on foam surface with patient demo ability to hold for full 20 - 30 seconds, progressed to complaint surface. increased difficulty noted with foam, able to hold <10 secs prior to needing intermittent UE support. Increased difficulty with SLS on LLE noted.     Tandem Gait Forward;Foam/compliant surface;Intermittent upper extremity support    Tandem Gait Limitations completed on blue foam foam, down and back x 4 laps with intermittent UE as needed. Increased difficulty with backwards tandem walking    Sidestepping Foam/compliant support;4 reps    Sidestepping Limitations completed side stepping w/o UE support along blue balance beam, completed x 4 laps down and back.                PT Short Term Goals - 03/15/20 0809      PT SHORT TERM GOAL #1   Title Improve Berg balance score by at least 4 points for reduced fall risk.    Baseline BERG assessed on 02/17/20, scored 50/56; 53/56 on 03/15/20    Time 4    Period Weeks    Status On-going    Target Date 03/17/20      PT SHORT TERM GOAL #2   Title Pt will improve TUG score from 14.57 secs to </= 12 secs without use of SPC for reduced fall risk.    Baseline 14.57 secs - pt carried cane, 9.62 w/o AD    Time 4    Period  Weeks    Status Achieved    Target Date 03/17/20      PT SHORT TERM GOAL #3   Title Pt will ambulate 500' without use of SPC on flat even surfaces with SBA for incr. community accessibility.    Baseline 59o' w/ no AD, Mod I    Time 4    Period Weeks    Status Achieved    Target Date 03/17/20  PT SHORT TERM GOAL #4   Title Pt will amb. 27' with horizontal head turns without device with supervision for safety with environmental scanning.    Baseline 115' w/ horizontal head turns, no AD, supervision level    Time 4    Period Weeks    Status Achieved    Target Date 03/17/20      PT SHORT TERM GOAL #5   Title Pt will participate in aquatic therapy after permission obtained from MD (craniotomy incision healed).    Baseline not initiated aquatic therapy at this time    Time 4    Period Weeks    Status On-going    Target Date 03/17/20      PT SHORT TERM GOAL #6   Title Independent in balance HEP.    Baseline Reports independence with exercises, only completing 4/7 days of week    Time 4    Period Weeks    Status Partially Met    Target Date 03/17/20      PT SHORT TERM GOAL #7   Title Perform SOT & establish goal as appropriate.    Time 4    Period Weeks    Status New    Target Date 04/21/20             PT Long Term Goals - 03/01/20 1354      PT LONG TERM GOAL #1   Title Pt will improve FGA score by at least 8 points from baseline score for incr. safety with amb.    Baseline TBA    Time 8    Period Weeks    Status New      PT LONG TERM GOAL #2   Title Pt will improve Berg balance test by at least 10 points from baseline for reduced fall risk.    Time 8    Period Weeks    Status New      PT LONG TERM GOAL #3   Title Pt will amb. 1000' on flat even/uneven srufaces without device with supervision.    Time 8    Period Weeks    Status New      PT LONG TERM GOAL #4   Title Pt will be independent in updated HEP including aquatic exercises at time of D/C from  PT.    Time 8    Period Weeks    Status New      PT LONG TERM GOAL #5   Title Pt will amb. 25' with head turns with no c/o dizziness and no imbalance for incr. safety with environmental scanning.    Time 8    Period Weeks    Status New                 Plan - 03/20/20 0916    Clinical Impression Statement Today's skilled PT session focused on continued progression of balance exercises as tolerated by patient. patient demo improved ability with tandem stance and SLS on complaint surfaces. Patient still experiencing very mild dizziness with horiozntal head turns. Patient will continue to benefit from skilled PT service to progress toward all unmet goals.    Personal Factors and Comorbidities Comorbidity 2;Transportation;Behavior Pattern;Comorbidity 3+    Comorbidities cranial nerve VII palsy, craniotomy on 01-27-20 for tumor resection, unilateral vestibular schwannoma, anxiety,, s/p Lt THR Dec. 2020    Examination-Activity Limitations Transfers;Locomotion Level;Squat;Stairs;Stand;Carry    Examination-Participation Restrictions Meal Prep;Cleaning;Community Activity;Driving;Shop;Laundry;Interpersonal Relationship    Stability/Clinical Decision Making Evolving/Moderate complexity    Rehab  Potential Good    PT Frequency 2x / week    PT Duration 8 weeks    PT Treatment/Interventions Aquatic Therapy;Gait training;Stair training;Therapeutic activities;Therapeutic exercise;Balance training;Neuromuscular re-education;Patient/family education;Vestibular    PT Next Visit Plan Perform SOT. hip ABD strengthening, perform obstacles. Cont balance exs- SLS and LLE strengthening - hip and quad esp.  Continue functional BLE strengthening, standing balance on compliant surfaces with head turns, eyes closed. tandem balance. Any update on aquatics from [facility]?    Consulted and Agree with Plan of Care Patient;Family member/caregiver    Family Member Consulted [name]           Patient will  benefit from skilled therapeutic intervention in order to improve the following deficits and impairments:  Abnormal gait, Decreased balance, Decreased activity tolerance, Decreased coordination, Dizziness, Impaired vision/preception  Visit Diagnosis: Unsteadiness on feet  Other abnormalities of gait and mobility  Muscle weakness (generalized)  Dizziness and giddiness     Problem List Patient Active Problem List   Diagnosis Date Noted   Cranial nerve VII palsy    Hypoalbuminemia due to protein-calorie malnutrition ([place])    Acute blood loss anemia    Sinus tachycardia    Postoperative pain    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[place]) 02/03/2020   Protein-calorie malnutrition, severe 01/30/2020   Brain tumor ([place]) 01/27/2020   Low folate 01/23/2020   Anemia of chronic disease 01/23/2020   Small bowel obstruction from retained endoscopy capsule at stricture s/p ileal resection 01/19/2020 01/18/2020   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 2020 09/07/2019   Hemoglobin low 09/07/2019   Primary localized osteoarthritis of left hip 09/04/19   Death of family member-  husband passed Jul 2020 04/29/2019   Reactive depression 04/01/2019   Sleep difficulties 09/09/2018   Pre-diabetes 03/09/2018   Postmenopausal state- went thru early 40's 01/02/2018   Primary osteoarthritis of left hip 01/02/2018   Caregiver burden- for husband with ALLeukemia 01/02/2018   Left hip pain 01/02/2018   Osteopenia 07/31/2016   Hypertension 05/20/2011   Menopausal state 05/20/2011    Jones Bales, PT, DPT 03/20/2020, 9:24 AM  [facility] [address] [place], [place], [address] Phone: [phone]   Fax:  [phone]  Name: ALEXANDRA LIPPS MRN: [id number] Date of Birth: June 14, 1961

## 2020-03-21 NOTE — Therapy (Signed)
[facility] [address] [place], [place], [address] Phone: [phone]   Fax:  [phone]  Speech Language Pathology Treatment  Patient Details  Name: Cristina Conner MRN: [id number] Date of Birth: 02-18-1961 Referring Provider (SLP): Dr. Alger Simons   Encounter Date: 03/20/2020   End of Session - 03/21/20 1019    Visit Number 11    Number of Visits 17    Date for SLP Re-Evaluation 04/12/20    SLP Start Time 0847    SLP Stop Time  0931    SLP Time Calculation (min) 44 min           Past Medical History:  Diagnosis Date   Anxiety    Arthritis    Diverticulosis    GERD (gastroesophageal reflux disease)    Hypertension    Osteopenia    Post-operative nausea and vomiting    vomiting after general anesthesia per pt.   Pre-diabetes     Past Surgical History:  Procedure Laterality Date   CESAREAN SECTION     x 2   COLONOSCOPY     CRANIOTOMY Left 01/27/2020   Procedure: Left Craniotomy for Tumor Resection;  Surgeon: Judith Part, MD;  Location: [facility];  Service: Neurosurgery;  Laterality: Left;   ENDOMETRIAL ABLATION     ESOPHAGOGASTRODUODENOSCOPY  10/2019   LAPAROSCOPY N/A 01/19/2020   Procedure: LAPAROSCOPY DIAGNOSTIC LAPAROTOMY WITH SMALL BOWEL RESECTION;  Surgeon: Ileana Roup, MD;  [facility];  Service: General;  Laterality: N/A;   PELVIC LAPAROSCOPY  1999   left salpingectomy, excision of Right, paratubal cyst   PELVIC LAPAROSCOPY     laparoscopy with lysis of pelvic adhesions   SHOULDER SURGERY  11/2010   "FROZEN SHOULDER"   TOTAL HIP ARTHROPLASTY Left 08/17/2019   Procedure: LEFT TOTAL HIP ARTHROPLASTY ANTERIOR APPROACH;  Surgeon: Melrose Nakayama, MD;  Location[facility];  Service: Orthopedics;  Laterality: Left;   TUBAL LIGATION     UPPER GASTROINTESTINAL ENDOSCOPY     VENTRICULOSTOMY Left 01/27/2020   Procedure: VENTRICULOSTOMY;  Surgeon: Judith Part, MD;  Location: [facility];  Service: Neurosurgery;  Laterality: Left;    There were no vitals filed for this visit.   Subjective Assessment - 03/20/20 0854    Subjective "I am stillk workingn on my husbands estate"                 ADULT SLP TREATMENT - 03/20/20 0855      General Information   Behavior/Cognition Alert;Cooperative;Pleasant mood      Treatment Provided   Treatment provided Cognitive-Linquistic      Cognitive-Linquistic Treatment   Treatment focused on Cognition;Patient/family/caregiver education    Skilled Treatment Cristina Conner reports success using compensatory strategies of  meal planning, using a list, going non peak times. 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 Cristina Conner required rare min A to return to task . Extended time minimal.       Assessment / Recommendations / Plan   Plan Continue with current plan of care      Dysphagia Recommendations   Diet recommendations Regular;Thin liquid    Liquids provided via Straw    Medication Administration Whole meds with liquid    Supervision Patient able to self feed    Compensations Check for pocketing;Slow rate      Progression Toward Goals   Progression toward goals Progressing  toward goals              SLP Short Term Goals - 03/21/20 1018      SLP SHORT TERM GOAL #1   Title Pt will complete HEP for oral dysphagia/ CN VII palsy with mod I    Status Partially Met      SLP SHORT TERM GOAL #2   Title Pt will follow swallow precautions and diet recommendation with mod I    Status Partially Met      SLP SHORT TERM GOAL #3   Title Pt will demonstrate anticipatory awareness on high level cognitive linguistic tasks in therapy and at home with rare min A    Status Achieved      SLP SHORT TERM GOAL #4   Title Pt will alternate attention on IADL tasks/high level congitive linguistic tasks  with 95% accuracy on each with rare min A    Time 1    Status Achieved            SLP Long Term Goals - 03/21/20 1019      SLP LONG TERM GOAL #1   Title Pt will utilize compensations for attention to successfully complete IADL's and high level congitive linguistic tasks with 90% accuracy and rare min A    Time 4    Period Weeks    Status On-going      SLP LONG TERM GOAL #2   Title Pt will generate 4 accomodations/compensations for attention to carryover in workplace with rare min A over 2 sessions    Time 4    Period Weeks    Status On-going      SLP LONG TERM GOAL #3   Title Pt will tolerate regular diet without avoiding less than 2 foods with rare min A over 2 sessions    Time 4    Period Weeks    Status On-going            Plan - 03/21/20 1017    Clinical Impression Statement Cristina Conner training in compensations for high level attention for 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 Continue skilled ST to maximize cognition for high level IADL's    Speech Therapy Frequency 2x / week    Duration --   8 weeks or 17 visits   Treatment/Interventions Aspiration precaution training;Environmental controls;Cueing hierarchy;Oral motor exercises;SLP instruction and feedback;Compensatory strategies;Functional tasks;Cognitive reorganization;Compensatory techniques;Diet toleration management by SLP;Trials of upgraded texture/liquids;Internal/external aids;Multimodal communcation approach;Patient/family education    Potential to Achieve Goals Good           Patient will benefit from skilled therapeutic intervention in order to improve the following deficits and impairments:   Cognitive communication deficit  Dysphagia, oral phase    Problem List Patient Active Problem List   Diagnosis Date Noted   Cranial nerve VII palsy    Hypoalbuminemia due to protein-calorie malnutrition ([place])    Acute blood loss anemia     Sinus tachycardia    Postoperative pain    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   Protein-calorie malnutrition, severe 01/30/2020   Brain tumor ([place]) 01/27/2020   Low folate 01/23/2020   Anemia of chronic disease 01/23/2020   Small bowel obstruction from retained endoscopy capsule at stricture s/p ileal resection 01/19/2020 01/18/2020   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 2020 09/07/2019   Hemoglobin low 09/07/2019   Primary localized osteoarthritis of left hip 09-09-19   Death of  family member-  husband passed Jul 2020 04/29/2019   Reactive depression 04/01/2019   Sleep difficulties 09/09/2018   Pre-diabetes 03/09/2018   Postmenopausal state- went thru early 40's 01/02/2018   Primary osteoarthritis of left hip 01/02/2018   Caregiver burden- for husband with ALLeukemia 01/02/2018   Left hip pain 01/02/2018   Osteopenia 07/31/2016   Hypertension 05/20/2011   Menopausal state 05/20/2011    Cristina Conner, Cristina Conner, Cristina Conner 03/21/2020, 10:21 AM  [facility] [address] [place], [place], [address] Phone: [phone]   Fax:  [phone]   Name: Cristina Conner MRN: [id number] Date of Birth: Sep 11, 1960

## 2020-03-22 ENCOUNTER — Encounter: Payer: Self-pay | Admitting: Speech Pathology

## 2020-03-22 ENCOUNTER — Other Ambulatory Visit: Payer: Self-pay

## 2020-03-22 ENCOUNTER — Ambulatory Visit: Payer: BC Managed Care – PPO

## 2020-03-22 ENCOUNTER — Ambulatory Visit: Payer: BC Managed Care – PPO | Admitting: Occupational Therapy

## 2020-03-22 ENCOUNTER — Ambulatory Visit: Payer: BC Managed Care – PPO | Admitting: Speech Pathology

## 2020-03-22 DIAGNOSIS — M6281 Muscle weakness (generalized): Secondary | ICD-10-CM

## 2020-03-22 DIAGNOSIS — R41841 Cognitive communication deficit: Secondary | ICD-10-CM | POA: Diagnosis not present

## 2020-03-22 DIAGNOSIS — R2681 Unsteadiness on feet: Secondary | ICD-10-CM

## 2020-03-22 DIAGNOSIS — R2689 Other abnormalities of gait and mobility: Secondary | ICD-10-CM

## 2020-03-22 DIAGNOSIS — R1311 Dysphagia, oral phase: Secondary | ICD-10-CM

## 2020-03-22 DIAGNOSIS — R42 Dizziness and giddiness: Secondary | ICD-10-CM

## 2020-03-22 NOTE — Therapy (Signed)
[facility] [address] [place], [place], [address] Phone: [phone]   Fax:  [phone]  Physical Therapy Treatment  Patient Details  Name: Cristina Conner MRN: [id number] Date of Birth: [birth year] Referring Provider (PT): Reesa Chew, [place]   Encounter Date: 03/22/2020   PT End of Session - 03/22/20 1111    Visit Number 12    Number of Visits 17    Date for PT Re-Evaluation 04/14/20    Authorization Type [employer] - [place]    PT Start Time [phone]   PT running late with previous patient   PT Stop Time 1018    PT Time Calculation (min) 40 min    Equipment Utilized During Treatment Gait belt    Activity Tolerance Patient tolerated treatment well    Behavior During Therapy [facility] for tasks assessed/performed           Past Medical History:  Diagnosis Date  . Anxiety   . Arthritis   . Diverticulosis   . GERD (gastroesophageal reflux disease)   . Hypertension   . Osteopenia   . Post-operative nausea and vomiting    vomiting after general anesthesia per pt.  . Pre-diabetes     Past Surgical History:  Procedure Laterality Date  . CESAREAN SECTION     x 2  . COLONOSCOPY    . CRANIOTOMY Left 01/27/2020   Procedure: Left Craniotomy for Tumor Resection;  Surgeon: Judith Part, MD;  Location: [facility];  Service: Neurosurgery;  Laterality: Left;  . ENDOMETRIAL ABLATION    . ESOPHAGOGASTRODUODENOSCOPY  10/2019  . LAPAROSCOPY N/A 01/19/2020   Procedure: LAPAROSCOPY DIAGNOSTIC LAPAROTOMY WITH SMALL BOWEL RESECTION;  Surgeon: Ileana Roup, MD;  Location: [facility];  Service: General;  Laterality: N/A;  . PELVIC LAPAROSCOPY  1999   left salpingectomy, excision of Right, paratubal cyst  . PELVIC LAPAROSCOPY     laparoscopy with lysis of pelvic adhesions  . SHOULDER SURGERY  11/2010   "FROZEN SHOULDER"  . TOTAL HIP ARTHROPLASTY Left 08/17/2019   Procedure: LEFT TOTAL HIP ARTHROPLASTY ANTERIOR APPROACH;  Surgeon:  Melrose Nakayama, MD;  Location[facility];  Service: Orthopedics;  Laterality: Left;  . TUBAL LIGATION    . UPPER GASTROINTESTINAL ENDOSCOPY    . VENTRICULOSTOMY Left 01/27/2020   Procedure: VENTRICULOSTOMY;  Surgeon: Judith Part, MD;  Location: [facility];  Service: Neurosurgery;  Laterality: Left;    There were no vitals filed for this visit.         Neuro re-ed: sensory organization test performed with following results: Conditions: 1: All 3 trials above age related norm 2: All 3 trials above age related norm 3: First trial above normal, second two trials below age norm  40: first trial below age related norm, following two trials above age related norm (patient reporting she had to get used to support surface moving) 5: first and third trial was marked as FALL due to imbalance, second trial below age related norm 6: all three trials marked as fall, patient demo decreased ability to maintain balance with posterior sway Composite score: 50 (below age related norm) Sensory Analysis Som: Above age related norm (approx 67)  Vis: Above age related norm (approx 52)  Vest: severely below age related norm (approx 10-15)  Pref: Patient demo below norm for preference, however very closed Strategy analysis: for the majority of time able to use hip/ankle strategy appropriately, but tends to lean more toward utilization of ankle strategy.  COG alignment: patient demo midline to posterior alignment                   Balance Exercises - 03/22/20 0001      Balance Exercises: Standing   Tandem Stance Eyes open;Foam/compliant surface;Intermittent upper extremity support;3 reps;Time    Tandem Stance Time 15-30    Tandem Stance Limitations completed in corner on pillows as review for HEP. Educated to use light UE support as needed.     SLS Eyes open;Foam/compliant surface;Intermittent upper extremity support;3 reps;Time    SLS Time 15-25    SLS Limitations completed in corner on  pillows as review for HEP. Educated to use light UE support as needed.                PT Short Term Goals - 03/15/20 0809      PT SHORT TERM GOAL #1   Title Improve Berg balance score by at least 4 points for reduced fall risk.    Baseline BERG assessed on 02/17/20, scored 50/56; 53/56 on 03/15/20    Time 4    Period Weeks    Status On-going    Target Date 03/17/20      PT SHORT TERM GOAL #2   Title Pt will improve TUG score from 14.57 secs to </= 12 secs without use of SPC for reduced fall risk.    Baseline 14.57 secs - pt carried cane, 9.62 w/o AD    Time 4    Period Weeks    Status Achieved    Target Date 03/17/20      PT SHORT TERM GOAL #3   Title Pt will ambulate 500' without use of SPC on flat even surfaces with SBA for incr. community accessibility.    Baseline 59o' w/ no AD, Mod I    Time 4    Period Weeks    Status Achieved    Target Date 03/17/20      PT SHORT TERM GOAL #4   Title Pt will amb. 71' with horizontal head turns without device with supervision for safety with environmental scanning.    Baseline 115' w/ horizontal head turns, no AD, supervision level    Time 4    Period Weeks    Status Achieved    Target Date 03/17/20      PT SHORT TERM GOAL #5   Title Pt will participate in aquatic therapy after permission obtained from MD (craniotomy incision healed).    Baseline not initiated aquatic therapy at this time    Time 4    Period Weeks    Status On-going    Target Date 03/17/20      PT SHORT TERM GOAL #6   Title Independent in balance HEP.    Baseline Reports independence with exercises, only completing 4/7 days of week    Time 4    Period Weeks    Status Partially Met    Target Date 03/17/20      PT SHORT TERM GOAL #7   Title Perform SOT & establish goal as appropriate.    Time 4    Period Weeks    Status New    Target Date 04/21/20             PT Long Term Goals - 03/01/20 1354      PT LONG TERM GOAL #1   Title Pt will  improve FGA score by at least 8 points from baseline score for incr. safety with amb.  Baseline TBA    Time 8    Period Weeks    Status New      PT LONG TERM GOAL #2   Title Pt will improve Berg balance test by at least 10 points from baseline for reduced fall risk.    Time 8    Period Weeks    Status New      PT LONG TERM GOAL #3   Title Pt will amb. 1000' on flat even/uneven srufaces without device with supervision.    Time 8    Period Weeks    Status New      PT LONG TERM GOAL #4   Title Pt will be independent in updated HEP including aquatic exercises at time of D/C from PT.    Time 8    Period Weeks    Status New      PT LONG TERM GOAL #5   Title Pt will amb. 6' with head turns with no c/o dizziness and no imbalance for incr. safety with environmental scanning.    Time 8    Period Weeks    Status New                 Plan - 03/22/20 1111    Clinical Impression Statement Today's skilled PT session included completion of Sensory Organization Test to further assess balance systems. Patient score above normal with Somatosensory and Vision, however below normal with vestibular, demonstrating continued need for PT services targeting vestibular system. Patient will continue to benefit from skilled PT services to progress toward all goals.    Personal Factors and Comorbidities Comorbidity 2;Transportation;Behavior Pattern;Comorbidity 3+    Comorbidities cranial nerve VII palsy, craniotomy on 01-27-20 for tumor resection, unilateral vestibular schwannoma, anxiety,, s/p Lt THR Dec. 2020    Examination-Activity Limitations Transfers;Locomotion Level;Squat;Stairs;Stand;Carry    Examination-Participation Restrictions Meal Prep;Cleaning;Community Activity;Driving;Shop;Laundry;Interpersonal Relationship    Stability/Clinical Decision Making Evolving/Moderate complexity    Rehab Potential Good    PT Frequency 2x / week    PT Duration 8 weeks    PT Treatment/Interventions  Aquatic Therapy;Gait training;Stair training;Therapeutic activities;Therapeutic exercise;Balance training;Neuromuscular re-education;Patient/family education;Vestibular    PT Next Visit Plan hip ABD strengthening, perform obstacles. Cont balance exs- SLS and LLE strengthening - hip and quad esp.  Continue functional BLE strengthening, standing balance on compliant surfaces with head turns, eyes closed. tandem balance. Any update on aquatics from [place]?    Consulted and Agree with Plan of Care Patient;Family member/caregiver    Family Member Consulted [name]           Patient will benefit from skilled therapeutic intervention in order to improve the following deficits and impairments:  Abnormal gait, Decreased balance, Decreased activity tolerance, Decreased coordination, Dizziness, Impaired vision/preception  Visit Diagnosis: Unsteadiness on feet  Muscle weakness (generalized)  Other abnormalities of gait and mobility  Dizziness and giddiness     Problem List Patient Active Problem List   Diagnosis Date Noted  . Cranial nerve VII palsy   . Hypoalbuminemia due to protein-calorie malnutrition ([place])   . Acute blood loss anemia   . Sinus tachycardia   . Postoperative pain   . 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 Protein-calorie malnutrition, severe 01/30/2020  . Brain tumor ([place]) 01/27/2020  . Low folate 01/23/2020  . Anemia of chronic disease 01/23/2020  . Small bowel obstruction from retained endoscopy capsule at stricture s/p ileal resection 01/19/2020 01/18/2020  . 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 Hemoglobin  low 09/07/2019  . Primary localized osteoarthritis of left hip September 03, 2019  . Death of family member-  husband passed Jul 2020 04/29/2019  . 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 Sleep difficulties 09/09/2018  . Pre-diabetes 03/09/2018  . Postmenopausal state- went thru early 40's 01/02/2018  . Primary osteoarthritis of left  hip 01/02/2018  . Caregiver burden- for husband with ALLeukemia 01/02/2018  . Left hip pain 01/02/2018  . Osteopenia 07/31/2016  . Hypertension 05/20/2011  . Menopausal state 05/20/2011    Jones Bales, PT, DPT 03/22/2020, 11:13 AM  [facility] [address] [place], [place], [address] Phone: [phone]   Fax:  [phone]  Name: Cristina Conner MRN: [id number] Date of Birth: [birth year]

## 2020-03-22 NOTE — Patient Instructions (Signed)
  When you return to work:  Consider shorter hours  Job that doesn't require multitasking  Have set times at the beginning and end of shift for checking emails and VM - let others know you may not respond right away  Let co-workers know not to interrupt you and that you may not be able to visit while doing a job at the same time  Let your boss know you may need to sit down and take a break for a few minutes  Same for Zumba, its OK to take a break for a song to let your brain reset

## 2020-03-22 NOTE — Therapy (Signed)
[facility] [address] [place], [place], [address] Phone: [phone]   Fax:  [phone]  Speech Language Pathology Treatment & Discharge Summary  Patient Details  Name: LEANE LORING MRN: [id number] Date of Birth: 1961/01/23 Referring Provider (SLP): Dr. Alger Simons   Encounter Date: 03/22/2020   End of Session - 03/22/20 0928    Visit Number 12    Number of Visits 17    Date for SLP Re-Evaluation 04/12/20    SLP Start Time 0844    SLP Stop Time  0928    SLP Time Calculation (min) 44 min    Activity Tolerance Patient tolerated treatment well           Past Medical History:  Diagnosis Date  . Anxiety   . Arthritis   . Diverticulosis   . GERD (gastroesophageal reflux disease)   . Hypertension   . Osteopenia   . Post-operative nausea and vomiting    vomiting after general anesthesia per pt.  . Pre-diabetes     Past Surgical History:  Procedure Laterality Date  . CESAREAN SECTION     x 2  . COLONOSCOPY    . CRANIOTOMY Left 01/27/2020   Procedure: Left Craniotomy for Tumor Resection;  Surgeon: Judith Part, MD;  Location: [facility];  Service: Neurosurgery;  Laterality: Left;  . ENDOMETRIAL ABLATION    . ESOPHAGOGASTRODUODENOSCOPY  10/2019  . LAPAROSCOPY N/A 01/19/2020   Procedure: LAPAROSCOPY DIAGNOSTIC LAPAROTOMY WITH SMALL BOWEL RESECTION;  Surgeon: Ileana Roup, MD;  [facility];  Service: General;  Laterality: N/A;  . PELVIC LAPAROSCOPY  1999   left salpingectomy, excision of Right, paratubal cyst  . PELVIC LAPAROSCOPY     laparoscopy with lysis of pelvic adhesions  . SHOULDER SURGERY  11/2010   "FROZEN SHOULDER"  . TOTAL HIP ARTHROPLASTY Left 08/17/2019   Procedure: LEFT TOTAL HIP ARTHROPLASTY ANTERIOR APPROACH;  Surgeon: Melrose Nakayama, MD;  [facility];  Service: Orthopedics;  Laterality: Left;  . TUBAL LIGATION    . UPPER GASTROINTESTINAL ENDOSCOPY    .  VENTRICULOSTOMY Left 01/27/2020   Procedure: VENTRICULOSTOMY;  Surgeon: Judith Part, MD;  Location: [facility];  Service: Neurosurgery;  Laterality: Left;    There were no vitals filed for this visit.   Subjective Assessment - 03/22/20 0849    Subjective "I have alot of medications of my husbands to get rid of"    Currently in Pain? Yes    Pain Score 1     Pain Location Head    Pain Orientation Left    Pain Type Acute pain    Pain Onset More than a month ago    Pain Frequency Intermittent                 ADULT SLP TREATMENT - 03/22/20 0853      General Information   Behavior/Cognition Alert;Cooperative;Pleasant mood      Treatment Provided   Treatment provided Cognitive-Linquistic      Cognitive-Linquistic Treatment   Treatment focused on Cognition;Patient/family/caregiver education    Skilled Treatment Maxene is having success clearning out husband's affects and managing business calls, estate and medical bills. She is keeping them organized at home in a filing system with success. Malayia is doing her hardest tasks early in the day when she is most alert and awake. She can work for a couple of hours them takes a break. She is carrying over multiple compensations for attention, energy conservation and  organization. Complex numerical reasoning as she enjoys soduku. Pt alternated convesation with cognitive task with mod I. She ID'd and corrected errors with rare min A. Ivin Booty generated changes/accomodations including shorter hours,, taking breaks and switching to a postion that doesn't require multitasking, reducing interruptions, having set times to check emails and VM. Ceana is tolerating regular diet cut into small pieces.       Assessment / Recommendations / Plan   Plan Continue with current plan of care      Dysphagia Recommendations   Diet recommendations Regular;Thin liquid    Liquids provided via [place]    Medication Administration Whole meds with liquid     Supervision Patient able to self feed    Compensations Check for pocketing;Slow rate      Progression Toward Goals   Progression toward goals Progressing toward goals            SLP Education - 03/22/20 0925    Education Details work Physiological scientist) Educated Patient    Methods Explanation    Comprehension Verbalized understanding          SPEECH THERAPY DISCHARGE SUMMARY  Visits from [facility] of Care: 12  Current functional level related to goals / functional outcomes: See goals below   Remaining deficits: Attention with fatigue   Education / Equipment: Compensations for cognition, energy conservation Plan: Patient agrees to discharge.  Patient goals were met. Patient is being discharged due to meeting the stated rehab goals.  ?????           SLP Short Term Goals - 03/22/20 0927      SLP SHORT TERM GOAL #1   Title Pt will complete HEP for oral dysphagia/ CN VII palsy with mod I    Status Partially Met      SLP SHORT TERM GOAL #2   Title Pt will follow swallow precautions and diet recommendation with mod I    Status Partially Met      SLP SHORT TERM GOAL #3   Title Pt will demonstrate anticipatory awareness on high level cognitive linguistic tasks in therapy and at home with rare min A    Status Achieved      SLP SHORT TERM GOAL #4   Title Pt will alternate attention on IADL tasks/high level congitive linguistic tasks with 95% accuracy on each with rare min A    Time 1    Status Achieved            SLP Long Term Goals - 03/22/20 9811      SLP LONG TERM GOAL #1   Title Pt will utilize compensations for attention to successfully complete IADL's and high level congitive linguistic tasks with 90% accuracy and rare min A    Time 4    Period Weeks    Status Achieved      SLP LONG TERM GOAL #2   Title Pt will generate 4 accomodations/compensations for attention to carryover in workplace with rare min A over 2 sessions    Time 4    Period  Weeks    Status Achieved      SLP LONG TERM GOAL #3   Title Pt will tolerate regular diet without avoiding less than 2 foods with rare min A over 2 sessions    Time 4    Period Weeks    Status Achieved            Plan - 03/22/20 0925    Clinical Impression  Statement Trevor has met all ST goals. She is successfully carrying over strategties for attention, energy conservation and organization to complete complex IADL's at home, including settling her husbands estate. We reviewed goas, all goals met. Recommend Aunika be d/c'd from [facility]. She is in agreement.    Speech Therapy Frequency 2x / week    Treatment/Interventions Aspiration precaution training;Environmental controls;Cueing hierarchy;Oral motor exercises;SLP instruction and feedback;Compensatory strategies;Functional tasks;Cognitive reorganization;Compensatory techniques;Diet toleration management by SLP;Trials of upgraded texture/liquids;Internal/external aids;Multimodal communcation approach;Patient/family education    Potential to Achieve Goals Good    Potential Considerations Ability to learn/carryover information           Patient will benefit from skilled therapeutic intervention in order to improve the following deficits and impairments:   Cognitive communication deficit  Dysphagia, oral phase    Problem List Patient Active Problem List   Diagnosis Date Noted  . Cranial nerve VII palsy   . Hypoalbuminemia due to protein-calorie malnutrition ([place])   . Acute blood loss anemia   . Sinus tachycardia   . Postoperative pain   . 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 Protein-calorie malnutrition, severe 01/30/2020  . Brain tumor ([place]) 01/27/2020  . Low folate 01/23/2020  . Anemia of chronic disease 01/23/2020  . Small bowel obstruction from retained endoscopy capsule at stricture s/p ileal resection 01/19/2020 01/18/2020  . 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 Hemoglobin low 09/07/2019   . Primary localized osteoarthritis of left hip 2019-09-09  . Death of family member-  husband passed Jul 2020 04/29/2019  . 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 Sleep difficulties 09/09/2018  . Pre-diabetes 03/09/2018  . Postmenopausal state- went thru early 40's 01/02/2018  . Primary osteoarthritis of left hip 01/02/2018  . Caregiver burden- for husband with ALLeukemia 01/02/2018  . Left hip pain 01/02/2018  . Osteopenia 07/31/2016  . Hypertension 05/20/2011  . Menopausal state 05/20/2011    Anny Sayler, Annye Rusk MS, CCC-SLP 03/22/2020, 1:17 PM  [facility] [address] [place], [place], [address] Phone: [phone]   Fax:  [phone]   Name: KARIAH LOREDO MRN: [id number] Date of Birth: 10-Aug-1961

## 2020-03-25 ENCOUNTER — Other Ambulatory Visit: Payer: Self-pay

## 2020-03-27 ENCOUNTER — Ambulatory Visit: Payer: BC Managed Care – PPO | Admitting: Occupational Therapy

## 2020-03-27 ENCOUNTER — Ambulatory Visit: Payer: BC Managed Care – PPO | Admitting: Speech Pathology

## 2020-03-27 ENCOUNTER — Ambulatory Visit: Payer: BC Managed Care – PPO

## 2020-03-27 NOTE — Telephone Encounter (Signed)
PMP was Reviewed: Placed a call to Cristina Conner regarding Tramadol usage, no answer. Left message to return the call.  Dr. Naaman Plummer note was reviewed.

## 2020-03-28 ENCOUNTER — Telehealth: Payer: Self-pay

## 2020-03-28 NOTE — Telephone Encounter (Signed)
Cristina Conner returned your phone call today.

## 2020-03-28 NOTE — Telephone Encounter (Signed)
Return Ms. Broughton call, no answer. Left message for her to return the call. Also asked if she would  let us know how she's taking her Tramadol, Awaiting a return call.

## 2020-03-29 ENCOUNTER — Other Ambulatory Visit: Payer: Self-pay

## 2020-03-29 ENCOUNTER — Encounter: Payer: BC Managed Care – PPO | Admitting: Speech Pathology

## 2020-03-29 ENCOUNTER — Ambulatory Visit: Payer: BC Managed Care – PPO | Admitting: Physical Therapy

## 2020-03-29 ENCOUNTER — Ambulatory Visit: Payer: BC Managed Care – PPO | Admitting: Occupational Therapy

## 2020-03-29 DIAGNOSIS — R41840 Attention and concentration deficit: Secondary | ICD-10-CM

## 2020-03-29 DIAGNOSIS — R278 Other lack of coordination: Secondary | ICD-10-CM

## 2020-03-29 DIAGNOSIS — R2689 Other abnormalities of gait and mobility: Secondary | ICD-10-CM

## 2020-03-29 DIAGNOSIS — R2681 Unsteadiness on feet: Secondary | ICD-10-CM

## 2020-03-29 DIAGNOSIS — R41841 Cognitive communication deficit: Secondary | ICD-10-CM | POA: Diagnosis not present

## 2020-03-29 DIAGNOSIS — M6281 Muscle weakness (generalized): Secondary | ICD-10-CM

## 2020-03-29 NOTE — Therapy (Signed)
[facility] [address] [place], [place], [address] Phone: [phone]   Fax:  [phone]  Occupational Therapy Treatment  Patient Details  Name: Cristina Conner MRN: [id number] Date of Birth: 08-28-1961 Referring Provider (OT): Dr. Alger Simons   Encounter Date: 03/29/2020   OT End of Session - 03/29/20 0942    Visit Number 7    Number of Visits 25    Date for OT Re-Evaluation 05/15/20    Authorization Type BC[employer] covered 100%, no visit limit    OT Start Time 0845    OT Stop Time 0930    OT Time Calculation (min) 45 min    Activity Tolerance Patient tolerated treatment well    Behavior During Therapy [facility] for tasks assessed/performed           Past Medical History:  Diagnosis Date   Anxiety    Arthritis    Diverticulosis    GERD (gastroesophageal reflux disease)    Hypertension    Osteopenia    Post-operative nausea and vomiting    vomiting after general anesthesia per pt.   Pre-diabetes     Past Surgical History:  Procedure Laterality Date   CESAREAN SECTION     x 2   COLONOSCOPY     CRANIOTOMY Left 01/27/2020   Procedure: Left Craniotomy for Tumor Resection;  Surgeon: Judith Part, MD;  Location: [facility];  Service: Neurosurgery;  Laterality: Left;   ENDOMETRIAL ABLATION     ESOPHAGOGASTRODUODENOSCOPY  10/2019   LAPAROSCOPY N/A 01/19/2020   Procedure: LAPAROSCOPY DIAGNOSTIC LAPAROTOMY WITH SMALL BOWEL RESECTION;  Surgeon: Ileana Roup, MD;  Location: [facility];  Service: General;  Laterality: N/A;   PELVIC LAPAROSCOPY  1999   left salpingectomy, excision of Right, paratubal cyst   PELVIC LAPAROSCOPY     laparoscopy with lysis of pelvic adhesions   SHOULDER SURGERY  11/2010   "FROZEN SHOULDER"   TOTAL HIP ARTHROPLASTY Left 08/17/2019   Procedure: LEFT TOTAL HIP ARTHROPLASTY ANTERIOR APPROACH;  Surgeon: Melrose Nakayama, MD;  Location[facility];  Service: Orthopedics;   Laterality: Left;   TUBAL LIGATION     UPPER GASTROINTESTINAL ENDOSCOPY     VENTRICULOSTOMY Left 01/27/2020   Procedure: VENTRICULOSTOMY;  Surgeon: Judith Part, MD;  Location: [facility];  Service: Neurosurgery;  Laterality: Left;    There were no vitals filed for this visit.   Subjective Assessment - 03/29/20 0850    Pertinent History Unilateral vestibular schwannoma, s/p craniotomy for tumor resection 01/27/20.  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 decr hearing L side    Limitations fall risk, L eye patched due to inability to close eye    Patient Stated Goals wants to be able to drive, improve balance, perform financial management, be able to take care of mother    Currently in Pain? Yes    Pain Location Head    Pain Orientation Left    Pain Descriptors / Indicators Shooting    Pain Onset More than a month ago    Pain Frequency Intermittent    Aggravating Factors  nothing    Pain Relieving Factors tylenol           Pt simulated watering plants carrying water back/forth x 3 to fill up pot on floor. Pt  also practiced getting cones off floor for dynamic balance/IADL tasks with no LOB.  Pt performing visual scanning crossing out #'s in numerical order and crossing out double #'s and writing times they appear on Rt side of page w/ 100% accuracy. Pt performing environmental scanning with 11/13 items found on first pass (85% accuracy). Pt found remaining items on 2nd pass.  Pt placing grooved pegs in pegboard Lt hand for coordination then removing with tweezers.                         OT Short Term Goals - 03/15/20 1223      OT SHORT TERM GOAL #1   Title Pt will be independent with initial HEP for LUE  coordination and strength.--check STGs 03/31/20    Time 6    Period Weeks    Status Achieved    Target Date 03/31/20      OT SHORT TERM GOAL #2   Title Pt will improve L hand coordination for ADLs as shown by improving time on 9-hole peg test by at least 12sec.    Baseline 40.40sec    Time 6    Period Weeks    Status New      OT SHORT TERM GOAL #3   Title Pt will be able to perform tabletop scanning with at least 95% accuracy for incr attention and IADL tasks.    Time 6    Period Weeks    Status New      OT SHORT TERM GOAL #4   Title Pt will perform simple home maintenance tasks mod I.    Time 6    Period Weeks    Status Achieved   03/07/20:  sweeping, laundry, dishes, wiping counters     OT SHORT TERM GOAL #5   Title Pt will perform simple financial management tasks with min A.    Time 6    Period Weeks    Status New             OT Long Term Goals - 02/15/20 1532      OT LONG TERM GOAL #1   Title Pt will be independent with updated HEP.--check LTGs 05/15/20    Time 12    Period Weeks    Status New    Target Date 05/15/20      OT LONG TERM GOAL #2   Title Pt will improve L grip strength by at least 8lbs to assist with home maintenance tasks/in prep for yardwork.    Time 12    Period Weeks    Status New      OT LONG TERM GOAL #3   Title Pt will demo improved balance and strength to be able to water plants.    Time 12    Period Weeks    Status New      OT LONG TERM GOAL #4   Title Pt will perform environmental scanning with at least 90% accuracy for incr safety for community activities.    Time 12    Period Weeks    Status New      OT LONG TERM GOAL #5   Title Pt will perform previous financial management tasks with supervision.    Time 12    Period Weeks    Status New      Long Term Additional Goals   Additional Long Term Goals Yes      OT LONG TERM GOAL #6  Title Pt will be able to alternate attention between at least 1 physical and 1 cognitive  task with at least 90% accuracy for incr safety with IADLs.    Time 12    Period Weeks    Status New                 Plan - 03/29/20 3149    Clinical Impression Statement Pt progressing towards goals.  Pt reports incr independence with IADL tasks.    Occupational performance deficits (Please refer to evaluation for details): ADL's;IADL's;Work;Leisure;Other    Body Structure / Function / Physical Skills ADL;Hearing;Vision;IADL;Balance;Mobility;Strength;Coordination;FMC;UE functional use    Cognitive Skills Attention;Memory;Perception;Thought    Rehab Potential Good    OT Frequency 2x / week    OT Duration 12 weeks    OT Treatment/Interventions Self-care/ADL training;DME and/or AE instruction;Therapeutic activities;Aquatic Therapy;Therapeutic exercise;Cognitive remediation/compensation;Visual/perceptual remediation/compensation;Functional Mobility Training;Neuromuscular education;Patient/family education    Plan attention/financial management.    Consulted and Agree with Plan of Care Patient           Patient will benefit from skilled therapeutic intervention in order to improve the following deficits and impairments:   Body Structure / Function / Physical Skills: ADL, Hearing, Vision, IADL, Balance, Mobility, Strength, Coordination, FMC, UE functional use Cognitive Skills: Attention, Memory, Perception, Thought     Visit Diagnosis: Unsteadiness on feet  Other lack of coordination  Attention and concentration deficit    Problem List Patient Active Problem List   Diagnosis Date Noted   Cranial nerve VII palsy    Hypoalbuminemia due to protein-calorie malnutrition (HCC)    Acute blood loss anemia    Sinus tachycardia    Postoperative pain    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HCC) 02/03/2020   Protein-calorie malnutrition, severe 01/30/2020   Brain tumor ([place]) 01/27/2020   Low folate 01/23/2020   Anemia of chronic disease 01/23/2020   Small bowel  obstruction from retained endoscopy capsule at stricture s/p ileal resection 01/19/2020 01/18/2020   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 2020 09/07/2019   Hemoglobin low 09/07/2019   Primary localized osteoarthritis of left hip 2019/08/18   Death of family member-  husband passed Jul 2020 04/29/2019   Reactive depression 04/01/2019   Sleep difficulties 09/09/2018   Pre-diabetes 03/09/2018   Postmenopausal state- went thru early 40's 01/02/2018   Primary osteoarthritis of left hip 01/02/2018   Caregiver burden- for husband with ALLeukemia 01/02/2018   Left hip pain 01/02/2018   Osteopenia 07/31/2016   Hypertension 05/20/2011   Menopausal state 05/20/2011    Carey Bullocks, OTR/L 03/29/2020, 9:44 AM  [facility] [address] [place], [place], [address] Phone: [phone]   Fax:  [phone]  Name: Cristina Conner MRN: [id number] Date of Birth: [birth year]

## 2020-03-29 NOTE — Therapy (Signed)
[facility] [address] [place], [place], [address] Phone: [phone]   Fax:  [phone]  Physical Therapy Treatment  Patient Details  Name: Cristina Conner MRN: [id number] Date of Birth: May 31, 1961 Referring Provider (PT): Reesa Chew, [place]   Encounter Date: 03/29/2020   PT End of Session - 03/29/20 1028    Visit Number 13    Number of Visits 17    Date for PT Re-Evaluation 04/14/20    Authorization Type BCBS - [place]    PT Start Time 0932    PT Stop Time 1013    PT Time Calculation (min) 41 min    Equipment Utilized During Treatment Gait belt    Activity Tolerance Patient tolerated treatment well    Behavior During Therapy [facility] for tasks assessed/performed           Past Medical History:  Diagnosis Date  . Anxiety   . Arthritis   . Diverticulosis   . GERD (gastroesophageal reflux disease)   . Hypertension   . Osteopenia   . Post-operative nausea and vomiting    vomiting after general anesthesia per pt.  . Pre-diabetes     Past Surgical History:  Procedure Laterality Date  . CESAREAN SECTION     x 2  . COLONOSCOPY    . CRANIOTOMY Left 01/27/2020   Procedure: Left Craniotomy for Tumor Resection;  Surgeon: Judith Part, MD;  Location: [facility];  Service: Neurosurgery;  Laterality: Left;  . ENDOMETRIAL ABLATION    . ESOPHAGOGASTRODUODENOSCOPY  10/2019  . LAPAROSCOPY N/A 01/19/2020   Procedure: LAPAROSCOPY DIAGNOSTIC LAPAROTOMY WITH SMALL BOWEL RESECTION;  Surgeon: Ileana Roup, MD;  [facility];  Service: General;  Laterality: N/A;  . PELVIC LAPAROSCOPY  1999   left salpingectomy, excision of Right, paratubal cyst  . PELVIC LAPAROSCOPY     laparoscopy with lysis of pelvic adhesions  . SHOULDER SURGERY  11/2010   "FROZEN SHOULDER"  . TOTAL HIP ARTHROPLASTY Left 08/17/2019   Procedure: LEFT TOTAL HIP ARTHROPLASTY ANTERIOR APPROACH;  Surgeon: Melrose Nakayama, MD;  [facility];   Service: Orthopedics;  Laterality: Left;  . TUBAL LIGATION    . UPPER GASTROINTESTINAL ENDOSCOPY    . VENTRICULOSTOMY Left 01/27/2020   Procedure: VENTRICULOSTOMY;  Surgeon: Judith Part, MD;  Location: [facility];  Service: Neurosurgery;  Laterality: Left;    There were no vitals filed for this visit.   Subjective Assessment - 03/29/20 0934    Subjective Tried the exercises at home - standing on one leg is difficult. Pain gets worse in the afternoon.    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    Currently in Pain? No/denies    Pain Onset More than a month ago                                  Balance Exercises - 03/29/20 0001      Balance Exercises: Standing   SLS Eyes open;Foam/compliant surface;Intermittent upper extremity support    SLS Limitations Standing on foam: alternating toe taps to soccer ball x20 reps then  rolling soccer ball forwards and back 5 times, performing 3 reps on each leg.     Step Ups Forward;Lateral;6 inch;Limitations    Step Ups Limitations no UE support x10 reps forwards with LLE, 2 x 10 reps laterally with LLE, cues to "float" nonstance leg for SLS and cues for control when lowering    Other Standing Exercises On foam: wide BOS eyes closed x30 seconds, feet hip width eyes closed x30 seconds progressing to more narrow BOS with eyes closed multiple reps progressing up to 30 seconds with pt needing intermittent fingertip support to bars for balance, then with wider BOS and eyes closed head turns and nods x5 reps each and then with more narrow (but feet not touching) and eyes closed 2 x 10 reps head turns and 2 x 10 reps head nods -  intermittent min guard for balance.     Other Standing Exercises Comments Stepping up with LLE onto red beam for SLS and tapping floor bubble x5 reps, and then to cone x5 reps. Then progressed to forward stepping LLE onto uneven side of blue balance disc x10 reps and tapping single cone with RLE. Then standing with LLE on blue balance disc and lateral taps with RLE to floor foam bubble - cues for slowed and controlled.                PT Short Term Goals - 03/29/20 1029      PT SHORT TERM GOAL #1   Title Improve Berg balance score by at least 4 points for reduced fall risk.    Baseline BERG assessed on 02/17/20, scored 50/56; 53/56 on 03/15/20    Time 4    Period Weeks    Status On-going    Target Date 03/17/20      PT SHORT TERM GOAL #2   Title Pt will improve TUG score from 14.57 secs to </= 12 secs without use of SPC for reduced fall risk.    Baseline 14.57 secs - pt carried cane, 9.62 w/o AD    Time 4    Period Weeks    Status Achieved    Target Date 03/17/20      PT SHORT TERM GOAL #3   Title Pt will ambulate 500' without use of SPC on flat even surfaces with SBA for incr. community accessibility.    Baseline 59o' w/ no AD, Mod I    Time 4    Period Weeks    Status Achieved    Target Date 03/17/20      PT SHORT TERM GOAL #4   Title Pt will amb. 32' with horizontal head turns without device with supervision for safety with environmental scanning.    Baseline 115' w/ horizontal head turns, no AD, supervision level    Time 4    Period Weeks    Status Achieved    Target Date 03/17/20      PT SHORT TERM GOAL #5   Title Pt will participate in aquatic therapy after permission obtained from MD (craniotomy incision healed).    Baseline not initiated aquatic therapy at this time    Time 4    Period Weeks    Status On-going    Target Date 03/17/20      PT SHORT TERM GOAL #6   Title Independent in balance HEP.    Baseline Reports independence with exercises, only  completing 4/7 days of week    Time 4    Period Weeks  Status Partially Met    Target Date 03/17/20      PT SHORT TERM GOAL #7   Title Perform SOT & establish goal as appropriate.    Baseline performed on 03/22/20 - LTG written as appropriate.    Time 4    Period Weeks    Status Achieved    Target Date 04/21/20             PT Long Term Goals - 03/29/20 1031      PT LONG TERM GOAL #1   Title Pt will improve FGA score by at least 8 points from baseline score for incr. safety with amb.    Baseline TBA    Time 8    Period Weeks    Status New      PT LONG TERM GOAL #2   Title Pt will improve Berg balance test by at least 10 points from baseline for reduced fall risk.    Time 8    Period Weeks    Status New      PT LONG TERM GOAL #3   Title Pt will amb. 1000' on flat even/uneven srufaces without device with supervision.    Time 8    Period Weeks    Status New      PT LONG TERM GOAL #4   Title Pt will be independent in updated HEP including aquatic exercises at time of D/C from PT.    Time 8    Period Weeks    Status New      PT LONG TERM GOAL #5   Title Pt will amb. 4' with head turns with no c/o dizziness and no imbalance for incr. safety with environmental scanning.    Time 8    Period Weeks    Status New      PT LONG TERM GOAL #6   Title Pt will improve composite score of SOT to at least 70 in order to demo improved vestibular input for balance.    Baseline composite score: 50    Time 8    Period Weeks    Status New                 Plan - 03/29/20 1032    Clinical Impression Statement Today's skilled session continued to focus on improving vestibular input for balance, LLE strengthening, and SLS on compliant surfaces. Pt with no reports of dizziness throughout session. Pt does improve with incr reps, esp with SLS. SOT performed last session on 03/22/20 and LTG added as appropriate. Will continue to progress towards LTGs.    Personal Factors and  Comorbidities Comorbidity 2;Transportation;Behavior Pattern;Comorbidity 3+    Comorbidities cranial nerve VII palsy, craniotomy on 01-27-20 for tumor resection, unilateral vestibular schwannoma, anxiety,, s/p Lt THR Dec. 2020    Examination-Activity Limitations Transfers;Locomotion Level;Squat;Stairs;Stand;Carry    Examination-Participation Restrictions Meal Prep;Cleaning;Community Activity;Driving;Shop;Laundry;Interpersonal Relationship    Stability/Clinical Decision Making Evolving/Moderate complexity    Rehab Potential Good    PT Frequency 2x / week    PT Duration 8 weeks    PT Treatment/Interventions Aquatic Therapy;Gait training;Stair training;Therapeutic activities;Therapeutic exercise;Balance training;Neuromuscular re-education;Patient/family education;Vestibular    PT Next Visit Plan activities for vestibular input for balance. perform obstacles. Cont balance exs- SLS and LLE strengthening - hip and quad esp.  Continue functional BLE strengthening, standing balance on compliant surfaces with head turns. Any update on aquatics from [place]?    Consulted and Agree with Plan of Care Patient;Family member/caregiver    Family Member  Consulted [name]           Patient will benefit from skilled therapeutic intervention in order to improve the following deficits and impairments:  Abnormal gait, Decreased balance, Decreased activity tolerance, Decreased coordination, Dizziness, Impaired vision/preception  Visit Diagnosis: Unsteadiness on feet  Muscle weakness (generalized)  Other abnormalities of gait and mobility  Other lack of coordination     Problem List Patient Active Problem List   Diagnosis Date Noted  . Cranial nerve VII palsy   . Hypoalbuminemia due to protein-calorie malnutrition ([place])   . Acute blood loss anemia   . Sinus tachycardia   . Postoperative pain   . 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 Protein-calorie malnutrition, severe 01/30/2020   . Brain tumor ([place]) 01/27/2020  . Low folate 01/23/2020  . Anemia of chronic disease 01/23/2020  . Small bowel obstruction from retained endoscopy capsule at stricture s/p ileal resection 01/19/2020 01/18/2020  . 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 Primary localized osteoarthritis of left hip 2019-08-28  . Death of family member-  husband passed Jul 2020 04/29/2019  . 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 Sleep difficulties 09/09/2018  . Pre-diabetes 03/09/2018  . Postmenopausal state- went thru early 40's 01/02/2018  . Primary osteoarthritis of left hip 01/02/2018  . Caregiver burden- for husband with ALLeukemia 01/02/2018  . Left hip pain 01/02/2018  . Osteopenia 07/31/2016  . Hypertension 05/20/2011  . Menopausal state 05/20/2011    Arliss Journey, PT, DPT  03/29/2020, 10:42 AM  [facility] [address] [place], [place], [address] Phone: [phone]   Fax:  [phone]  Name: Cristina Conner MRN: [id number] Date of Birth: December 03, 1960

## 2020-03-30 ENCOUNTER — Telehealth: Payer: Self-pay | Admitting: *Deleted

## 2020-03-30 NOTE — Telephone Encounter (Signed)
Cristina Conner has called back to speak with Zella Ball.

## 2020-03-31 ENCOUNTER — Telehealth: Payer: Self-pay | Admitting: Registered Nurse

## 2020-03-31 MED ORDER — TRAMADOL HCL 50 MG PO TABS: 50.0000 mg | ORAL_TABLET | Freq: Every day | ORAL | 1 refills | Status: DC | PRN

## 2020-03-31 NOTE — Telephone Encounter (Signed)
Return Cristina Conner call, no answer. Left message to return the call.

## 2020-03-31 NOTE — Telephone Encounter (Signed)
Ms. Sather called office today, stating she takes her Tramadol daily. Tramadol e-scribed today.

## 2020-04-03 ENCOUNTER — Ambulatory Visit: Payer: BC Managed Care – PPO | Admitting: Physical Therapy

## 2020-04-03 ENCOUNTER — Ambulatory Visit: Payer: BC Managed Care – PPO | Admitting: Occupational Therapy

## 2020-04-05 ENCOUNTER — Ambulatory Visit: Payer: BC Managed Care – PPO | Attending: Physical Medicine and Rehabilitation

## 2020-04-05 ENCOUNTER — Ambulatory Visit: Payer: BC Managed Care – PPO | Admitting: Occupational Therapy

## 2020-04-05 ENCOUNTER — Other Ambulatory Visit: Payer: Self-pay

## 2020-04-05 DIAGNOSIS — R2681 Unsteadiness on feet: Secondary | ICD-10-CM | POA: Diagnosis not present

## 2020-04-05 DIAGNOSIS — R278 Other lack of coordination: Secondary | ICD-10-CM | POA: Diagnosis present

## 2020-04-05 DIAGNOSIS — R2689 Other abnormalities of gait and mobility: Secondary | ICD-10-CM | POA: Diagnosis present

## 2020-04-05 DIAGNOSIS — R42 Dizziness and giddiness: Secondary | ICD-10-CM | POA: Diagnosis present

## 2020-04-05 DIAGNOSIS — M6281 Muscle weakness (generalized): Secondary | ICD-10-CM | POA: Diagnosis present

## 2020-04-05 DIAGNOSIS — R41840 Attention and concentration deficit: Secondary | ICD-10-CM | POA: Diagnosis present

## 2020-04-05 DIAGNOSIS — R41844 Frontal lobe and executive function deficit: Secondary | ICD-10-CM | POA: Insufficient documentation

## 2020-04-05 NOTE — Therapy (Signed)
[facility] [address] [place], [place], [address] Phone: [phone]   Fax:  [phone]  Occupational Therapy Treatment  Patient Details  Name: Cristina Conner MRN: [id number] Date of Birth: [birth year] Referring Provider (OT): Dr. Alger Simons   Encounter Date: 04/05/2020   OT End of Session - 04/05/20 0816    Visit Number 8    Number of Visits 25    Date for OT Re-Evaluation 05/15/20    Authorization Type [employer] covered 100%, no visit limit    OT Start Time 0800    OT Stop Time 0845    OT Time Calculation (min) 45 min    Activity Tolerance Patient tolerated treatment well    Behavior During Therapy [facility] for tasks assessed/performed           Past Medical History:  Diagnosis Date  . Anxiety   . Arthritis   . Diverticulosis   . GERD (gastroesophageal reflux disease)   . Hypertension   . Osteopenia   . Post-operative nausea and vomiting    vomiting after general anesthesia per pt.  . Pre-diabetes     Past Surgical History:  Procedure Laterality Date  . CESAREAN SECTION     x 2  . COLONOSCOPY    . CRANIOTOMY Left 01/27/2020   Procedure: Left Craniotomy for Tumor Resection;  Surgeon: Judith Part, MD;  Location: [facility];  Service: Neurosurgery;  Laterality: Left;  . ENDOMETRIAL ABLATION    . ESOPHAGOGASTRODUODENOSCOPY  10/2019  . LAPAROSCOPY N/A 01/19/2020   Procedure: LAPAROSCOPY DIAGNOSTIC LAPAROTOMY WITH SMALL BOWEL RESECTION;  Surgeon: Ileana Roup, MD;  [facility];  Service: General;  Laterality: N/A;  . PELVIC LAPAROSCOPY  1999   left salpingectomy, excision of Right, paratubal cyst  . PELVIC LAPAROSCOPY     laparoscopy with lysis of pelvic adhesions  . SHOULDER SURGERY  11/2010   "FROZEN SHOULDER"  . TOTAL HIP ARTHROPLASTY Left 08/17/2019   Procedure: LEFT TOTAL HIP ARTHROPLASTY ANTERIOR APPROACH;  Surgeon: Melrose Nakayama, MD;  Location[facility];  Service: Orthopedics;   Laterality: Left;  . TUBAL LIGATION    . UPPER GASTROINTESTINAL ENDOSCOPY    . VENTRICULOSTOMY Left 01/27/2020   Procedure: VENTRICULOSTOMY;  Surgeon: Judith Part, MD;  Location: [facility];  Service: Neurosurgery;  Laterality: Left;    There were no vitals filed for this visit.   Subjective Assessment - 04/05/20 0804    Subjective  I had to cancel this Monday's appointment for another eye appointment. I'm doing more cooking too; just not cutting/chopping due to my vision    Pertinent History Unilateral vestibular schwannoma, s/p craniotomy for tumor resection 01/27/20.  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 decr hearing L side    Limitations fall risk, L eye patched due to inability to close eye    Patient Stated Goals wants to be able to drive, improve balance, perform financial management, be able to take care of mother    Currently in Pain? No/denies           Pt copying peg design Lt hand with min difficulty and 1 peg omitted but easily id with questioning cue. Otherwise copied design correctly.  9 hole peg design Lt = 31.03 sec Financial management tasks (2 deposits and  5 debits) with 2 errors: omitting 1 debit and accidentally subtracting deposit vs. Adding. Pt then able to correct mistakes and do accurately with calculator.                        OT Short Term Goals - 04/05/20 8280      OT SHORT TERM GOAL #1   Title Pt will be independent with initial HEP for LUE coordination and strength.--check STGs 03/31/20    Time 6    Period Weeks    Status Achieved    Target Date 03/31/20      OT SHORT TERM GOAL #2   Title Pt will improve L hand coordination for ADLs as shown by improving time on 9-hole peg  test by at least 12sec.    Baseline 40.40sec    Time 6    Period Weeks    Status On-going   31.03 sec     OT SHORT TERM GOAL #3   Title Pt will be able to perform tabletop scanning with at least 95% accuracy for incr attention and IADL tasks.    Time 6    Period Weeks    Status Achieved      OT SHORT TERM GOAL #4   Title Pt will perform simple home maintenance tasks mod I.    Time 6    Period Weeks    Status Achieved   03/07/20:  sweeping, laundry, dishes, wiping counters     OT SHORT TERM GOAL #5   Title Pt will perform simple financial management tasks with min A.    Time 6    Period Weeks    Status Achieved   Pt required min assist to correct 1 mistake (subtracting deposit vs. adding)            OT Long Term Goals - 04/05/20 0829      OT LONG TERM GOAL #1   Title Pt will be independent with updated HEP.--check LTGs 05/15/20    Time 12    Period Weeks    Status New      OT LONG TERM GOAL #2   Title Pt will improve L grip strength by at least 8lbs to assist with home maintenance tasks/in prep for yardwork.    Time 12    Period Weeks    Status New      OT LONG TERM GOAL #3   Title Pt will demo improved balance and strength to be able to water plants.    Time 12    Period Weeks    Status Achieved      OT LONG TERM GOAL #4   Title Pt will perform environmental scanning with at least 90% accuracy for incr safety for community activities.    Time 12    Period Weeks    Status On-going      OT LONG TERM GOAL #5   Title Pt will perform previous financial management tasks with supervision.    Time 12    Period Weeks    Status On-going      OT LONG TERM GOAL #6   Title Pt will be able to alternate attention between at least 1 physical and 1 cognitive task with at least 90% accuracy for incr safety with IADLs.    Time 12    Period Weeks    Status New                 Plan -  04/05/20 0844    Clinical Impression Statement Pt progressing towards goals.  See  goal section for updates. Pt reports incr independence with IADL tasks.    Occupational performance deficits (Please refer to evaluation for details): ADL's;IADL's;Work;Leisure;Other    Body Structure / Function / Physical Skills ADL;Hearing;Vision;IADL;Balance;Mobility;Strength;Coordination;FMC;UE functional use    Cognitive Skills Attention;Memory;Perception;Thought    Rehab Potential Good    OT Frequency 2x / week    OT Duration 12 weeks    OT Treatment/Interventions Self-care/ADL training;DME and/or AE instruction;Therapeutic activities;Aquatic Therapy;Therapeutic exercise;Cognitive remediation/compensation;Visual/perceptual remediation/compensation;Functional Mobility Training;Neuromuscular education;Patient/family education    Plan Lt grip strength, continue environmental scanning    Consulted and Agree with Plan of Care Patient           Patient will benefit from skilled therapeutic intervention in order to improve the following deficits and impairments:   Body Structure / Function / Physical Skills: ADL, Hearing, Vision, IADL, Balance, Mobility, Strength, Coordination, FMC, UE functional use Cognitive Skills: Attention, Memory, Perception, Thought     Visit Diagnosis: Attention and concentration deficit  Other lack of coordination    Problem List Patient Active Problem List   Diagnosis Date Noted  . Cranial nerve VII palsy   . Hypoalbuminemia due to protein-calorie malnutrition ([place])   . Acute blood loss anemia   . Sinus tachycardia   . Postoperative pain   . 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 Protein-calorie malnutrition, severe 01/30/2020  . Brain tumor ([place]) 01/27/2020  . Low folate 01/23/2020  . Anemia of chronic disease 01/23/2020  . Small bowel obstruction from retained endoscopy capsule at stricture s/p ileal resection 01/19/2020 01/18/2020  . 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 Primary  localized osteoarthritis of left hip 08/30/2019  . Death of family member-  husband passed Jul 2020 04/29/2019  . 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 Sleep difficulties 09/09/2018  . Pre-diabetes 03/09/2018  . Postmenopausal state- went thru early 40's 01/02/2018  . Primary osteoarthritis of left hip 01/02/2018  . Caregiver burden- for husband with ALLeukemia 01/02/2018  . Left hip pain 01/02/2018  . Osteopenia 07/31/2016  . Hypertension 05/20/2011  . Menopausal state 05/20/2011    Carey Bullocks, OTR/L 04/05/2020, 8:58 AM  [facility] [address] [place], [place], [address] Phone: [phone]   Fax:  [phone]  Name: JUDAEA BURGOON MRN: [id number] Date of Birth: April 04, 1961

## 2020-04-05 NOTE — Therapy (Signed)
[facility] [address] [place], [place], [address] Phone: [phone]   Fax:  [phone]  Physical Therapy Treatment  Patient Details  Name: Cristina Conner MRN: [id number] Date of Birth: [birth year] Referring Provider (PT): Reesa Chew, [place]   Encounter Date: 04/05/2020   PT End of Session - 04/05/20 0939    Visit Number 14    Number of Visits 17    Date for PT Re-Evaluation 04/14/20    Authorization Type BCBS - [place]    PT Start Time 0932    PT Stop Time 1015    PT Time Calculation (min) 43 min    Equipment Utilized During Treatment Gait belt    Activity Tolerance Patient tolerated treatment well    Behavior During Therapy [facility] for tasks assessed/performed           Past Medical History:  Diagnosis Date  . Anxiety   . Arthritis   . Diverticulosis   . GERD (gastroesophageal reflux disease)   . Hypertension   . Osteopenia   . Post-operative nausea and vomiting    vomiting after general anesthesia per pt.  . Pre-diabetes     Past Surgical History:  Procedure Laterality Date  . CESAREAN SECTION     x 2  . COLONOSCOPY    . CRANIOTOMY Left 01/27/2020   Procedure: Left Craniotomy for Tumor Resection;  Surgeon: Judith Part, MD;  Location: [facility];  Service: Neurosurgery;  Laterality: Left;  . ENDOMETRIAL ABLATION    . ESOPHAGOGASTRODUODENOSCOPY  10/2019  . LAPAROSCOPY N/A 01/19/2020   Procedure: LAPAROSCOPY DIAGNOSTIC LAPAROTOMY WITH SMALL BOWEL RESECTION;  Surgeon: Ileana Roup, MD;  [facility];  Service: General;  Laterality: N/A;  . PELVIC LAPAROSCOPY  1999   left salpingectomy, excision of Right, paratubal cyst  . PELVIC LAPAROSCOPY     laparoscopy with lysis of pelvic adhesions  . SHOULDER SURGERY  11/2010   "FROZEN SHOULDER"  . TOTAL HIP ARTHROPLASTY Left 08/17/2019   Procedure: LEFT TOTAL HIP ARTHROPLASTY ANTERIOR APPROACH;  Surgeon: Melrose Nakayama, MD;  [facility];   Service: Orthopedics;  Laterality: Left;  . TUBAL LIGATION    . UPPER GASTROINTESTINAL ENDOSCOPY    . VENTRICULOSTOMY Left 01/27/2020   Procedure: VENTRICULOSTOMY;  Surgeon: Judith Part, MD;  Location: [facility];  Service: Neurosurgery;  Laterality: Left;    There were no vitals filed for this visit.   Subjective Assessment - 04/05/20 0936    Subjective Patient reports that she is going to start taking eyes drops for her L eyes. No pain.    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 No/denies    Pain Onset More than a month ago                                  Balance Exercises - 04/05/20 0001      Balance Exercises: Standing   Standing Eyes Closed Narrow base of support (BOS);Head turns;Foam/compliant surface;3 reps;30 secs;Limitations    Standing Eyes Closed Limitations completed 3 x 30 seconds of  narrow BOS with eyes closed with intermittent UE support as needed. Completed horizontal/vertical head turns 2 x 10 reps each with narrow BOS and eyes closed. Patietn requiring slightly widing BOS with vertical head turns due to imabalance with vision removesd.     SLS Eyes open;Foam/compliant surface;Intermittent upper extremity support;2 reps;20 secs    SLS Time 15-25    SLS Limitations Completed static SLS on foam, 2 x 15-25 seconds each alternating BLE. Increased difficulty with SLS on LLE. Progressed SLS acticities, inlcuding SLS on foam, with one lower extremity placed on soccer ball, completed ant/post roll and lateral rolling x 10 reps each direction. Switched stance limb and completed same reps. Increased difficulty with LLE,  intermittent UE support and CGA required.     Rockerboard Anterior/posterior;Lateral;EO;EC;Intermittent UE support;Limitations    Rockerboard Limitations Completed static standing on rockerboard with eyes open, PT providing ant/posterior perturbations and lateral perturbations x 2 minutes each direction, patient able to do well with this and utilize appropriate ankle/hip strategy to maintain balance. Completed static standing on rockerboard with vision removed, postioned ant/posterior 3 x 30 seconds, postioned laterally 3 x 20-30 seconds. increased difficulty with vision removed and board positioned laterally. Patient tend to lose balance to L side requiring UE support from bars.     Other Standing Exercises Completed standing balance on firm surface with narrow BOS and horizontal head turns completing the stroop task x 4 minutes. With patient working on stating the color of the word. Patient demo 1-2 mistakes with completion.                PT Short Term Goals - 03/29/20 1029      PT SHORT TERM GOAL #1   Title Improve Berg balance score by at least 4 points for reduced fall risk.    Baseline BERG assessed on 02/17/20, scored 50/56; 53/56 on 03/15/20    Time 4    Period Weeks    Status On-going    Target Date 03/17/20      PT SHORT TERM GOAL #2   Title Pt will improve TUG score from 14.57 secs to </= 12 secs without use of SPC for reduced fall risk.    Baseline 14.57 secs - pt carried cane, 9.62 w/o AD    Time 4    Period Weeks    Status Achieved    Target Date 03/17/20      PT SHORT TERM GOAL #3   Title Pt will ambulate 500' without use of SPC on flat even surfaces with SBA for incr. community accessibility.    Baseline 59o' w/ no AD, Mod I    Time 4    Period Weeks    Status Achieved    Target Date 03/17/20      PT SHORT TERM GOAL #4   Title Pt will amb. 80' with horizontal head turns without device with supervision for safety with environmental scanning.    Baseline 115' w/  horizontal head turns, no AD, supervision level    Time 4    Period Weeks    Status Achieved    Target Date 03/17/20      PT SHORT TERM GOAL #5   Title Pt will participate in aquatic therapy after permission obtained from MD (craniotomy incision healed).    Baseline not initiated aquatic therapy at this time    Time 4    Period Weeks    Status On-going    Target Date 03/17/20  PT SHORT TERM GOAL #6   Title Independent in balance HEP.    Baseline Reports independence with exercises, only completing 4/7 days of week    Time 4    Period Weeks    Status Partially Met    Target Date 03/17/20      PT SHORT TERM GOAL #7   Title Perform SOT & establish goal as appropriate.    Baseline performed on 03/22/20 - LTG written as appropriate.    Time 4    Period Weeks    Status Achieved    Target Date 04/21/20             PT Long Term Goals - 03/29/20 1031      PT LONG TERM GOAL #1   Title Pt will improve FGA score by at least 8 points from baseline score for incr. safety with amb.    Baseline TBA    Time 8    Period Weeks    Status New      PT LONG TERM GOAL #2   Title Pt will improve Berg balance test by at least 10 points from baseline for reduced fall risk.    Time 8    Period Weeks    Status New      PT LONG TERM GOAL #3   Title Pt will amb. 1000' on flat even/uneven srufaces without device with supervision.    Time 8    Period Weeks    Status New      PT LONG TERM GOAL #4   Title Pt will be independent in updated HEP including aquatic exercises at time of D/C from PT.    Time 8    Period Weeks    Status New      PT LONG TERM GOAL #5   Title Pt will amb. 42' with head turns with no c/o dizziness and no imbalance for incr. safety with environmental scanning.    Time 8    Period Weeks    Status New      PT LONG TERM GOAL #6   Title Pt will improve composite score of SOT to at least 70 in order to demo improved vestibular input for balance.    Baseline  composite score: 50    Time 8    Period Weeks    Status New                 Plan - 04/05/20 1109    Clinical Impression Statement Continued progression of balance activites today with focus on vestibular component of balance. Introduced stroop task into session today with horizontal head turns, with patient tolerating well. Patient will continued to benefit from skilled PT services to progress toward all goals.    Personal Factors and Comorbidities Comorbidity 2;Transportation;Behavior Pattern;Comorbidity 3+    Comorbidities cranial nerve VII palsy, craniotomy on 01-27-20 for tumor resection, unilateral vestibular schwannoma, anxiety,, s/p Lt THR Dec. 2020    Examination-Activity Limitations Transfers;Locomotion Level;Squat;Stairs;Stand;Carry    Examination-Participation Restrictions Meal Prep;Cleaning;Community Activity;Driving;Shop;Laundry;Interpersonal Relationship    Stability/Clinical Decision Making Evolving/Moderate complexity    Rehab Potential Good    PT Frequency 2x / week    PT Duration 8 weeks    PT Treatment/Interventions Aquatic Therapy;Gait training;Stair training;Therapeutic activities;Therapeutic exercise;Balance training;Neuromuscular re-education;Patient/family education;Vestibular    PT Next Visit Plan How has eyes drops been for eye? activities for vestibular input for balance. perform obstacles. Cont balance exs- SLS and LLE strengthening - hip and quad esp.  Continue  functional BLE strengthening, standing balance on compliant surfaces with head turns. Any update on aquatics from [place]? Completed stroop task/add in cognitive tasks to vestibular exercises. Potentially try Stroop Task on Foam.    Consulted and Agree with Plan of Care Patient;Family member/caregiver    Family Member Consulted [name]           Patient will benefit from skilled therapeutic intervention in order to improve the following deficits and impairments:  Abnormal gait, Decreased  balance, Decreased activity tolerance, Decreased coordination, Dizziness, Impaired vision/preception  Visit Diagnosis: Unsteadiness on feet  Muscle weakness (generalized)  Other abnormalities of gait and mobility  Dizziness and giddiness     Problem List Patient Active Problem List   Diagnosis Date Noted  . Cranial nerve VII palsy   . Hypoalbuminemia due to protein-calorie malnutrition ([place])   . Acute blood loss anemia   . Sinus tachycardia   . Postoperative pain   . 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 Protein-calorie malnutrition, severe 01/30/2020  . Brain tumor ([place]) 01/27/2020  . Low folate 01/23/2020  . Anemia of chronic disease 01/23/2020  . Small bowel obstruction from retained endoscopy capsule at stricture s/p ileal resection 01/19/2020 01/18/2020  . 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 Primary localized osteoarthritis of left hip 2019/09/05  . Death of family member-  husband passed Jul 2020 04/29/2019  . 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 Sleep difficulties 09/09/2018  . Pre-diabetes 03/09/2018  . Postmenopausal state- went thru early 40's 01/02/2018  . Primary osteoarthritis of left hip 01/02/2018  . Caregiver burden- for husband with ALLeukemia 01/02/2018  . Left hip pain 01/02/2018  . Osteopenia 07/31/2016  . Hypertension 05/20/2011  . Menopausal state 05/20/2011    Jones Bales, PT, DPT 04/05/2020, 11:12 AM  [facility] [address] [place], [place], [address] Phone: [phone]   Fax:  [phone]  Name: Cristina Conner MRN: [id number] Date of Birth: 15-Oct-1960

## 2020-04-10 ENCOUNTER — Other Ambulatory Visit: Payer: Self-pay

## 2020-04-10 ENCOUNTER — Encounter: Payer: Self-pay | Admitting: Physical Therapy

## 2020-04-10 ENCOUNTER — Ambulatory Visit: Payer: BC Managed Care – PPO | Admitting: Physical Therapy

## 2020-04-10 ENCOUNTER — Ambulatory Visit: Payer: BC Managed Care – PPO | Admitting: Occupational Therapy

## 2020-04-10 ENCOUNTER — Encounter: Payer: Self-pay | Admitting: Occupational Therapy

## 2020-04-10 DIAGNOSIS — R2681 Unsteadiness on feet: Secondary | ICD-10-CM | POA: Diagnosis not present

## 2020-04-10 DIAGNOSIS — R2689 Other abnormalities of gait and mobility: Secondary | ICD-10-CM

## 2020-04-10 DIAGNOSIS — R278 Other lack of coordination: Secondary | ICD-10-CM

## 2020-04-10 DIAGNOSIS — R41840 Attention and concentration deficit: Secondary | ICD-10-CM

## 2020-04-10 DIAGNOSIS — M6281 Muscle weakness (generalized): Secondary | ICD-10-CM

## 2020-04-10 DIAGNOSIS — R42 Dizziness and giddiness: Secondary | ICD-10-CM

## 2020-04-10 DIAGNOSIS — R41844 Frontal lobe and executive function deficit: Secondary | ICD-10-CM

## 2020-04-10 NOTE — Therapy (Signed)
[facility] [address] [place], [place], [address] Phone: [phone]   Fax:  [phone]  Occupational Therapy Treatment  Patient Details  Name: Cristina Conner MRN: [id number] Date of Birth: Sep 14, 1960 Referring Provider (OT): Dr. Alger Simons   Encounter Date: 04/10/2020   OT End of Session - 04/10/20 0904    Visit Number 9    Number of Visits 25    Date for OT Re-Evaluation 05/15/20    Authorization Type BCBS covered 100%, no visit limit    OT Start Time 0848    OT Stop Time 0930    OT Time Calculation (min) 42 min    Activity Tolerance Patient tolerated treatment well    Behavior During Therapy [facility] for tasks assessed/performed           Past Medical History:  Diagnosis Date  . Anxiety   . Arthritis   . Diverticulosis   . GERD (gastroesophageal reflux disease)   . Hypertension   . Osteopenia   . Post-operative nausea and vomiting    vomiting after general anesthesia per pt.  . Pre-diabetes     Past Surgical History:  Procedure Laterality Date  . CESAREAN SECTION     x 2  . COLONOSCOPY    . CRANIOTOMY Left 01/27/2020   Procedure: Left Craniotomy for Tumor Resection;  Surgeon: Judith Part, MD;  Location: [facility];  Service: Neurosurgery;  Laterality: Left;  . ENDOMETRIAL ABLATION    . ESOPHAGOGASTRODUODENOSCOPY  10/2019  . LAPAROSCOPY N/A 01/19/2020   Procedure: LAPAROSCOPY DIAGNOSTIC LAPAROTOMY WITH SMALL BOWEL RESECTION;  Surgeon: Ileana Roup, MD;  Location: [facility];  Service: General;  Laterality: N/A;  . PELVIC LAPAROSCOPY  1999   left salpingectomy, excision of Right, paratubal cyst  . PELVIC LAPAROSCOPY     laparoscopy with lysis of pelvic adhesions  . SHOULDER SURGERY  11/2010   "FROZEN SHOULDER"  . TOTAL HIP ARTHROPLASTY Left 08/17/2019   Procedure: LEFT TOTAL HIP ARTHROPLASTY ANTERIOR APPROACH;  Surgeon: Melrose Nakayama, MD;  Location: [facility];  Service: Orthopedics;   Laterality: Left;  . TUBAL LIGATION    . UPPER GASTROINTESTINAL ENDOSCOPY    . VENTRICULOSTOMY Left 01/27/2020   Procedure: VENTRICULOSTOMY;  Surgeon: Judith Part, MD;  Location: [facility];  Service: Neurosurgery;  Laterality: Left;    There were no vitals filed for this visit.   Subjective Assessment - 04/10/20 0852    Subjective  Pt reports that she puts medication in eye every 2 hrs for 6x/day, antibiotic drops, wearing new patch all the time.  Cornea damage and infection.  Has seen the ophthalmologist 4x.  Pt reports that under eyelid is tender--Goes back Friday.  Pt is doing more cooking and cut 1/2 onion over the weekend    Pertinent History Unilateral vestibular schwannoma, s/p craniotomy for tumor resection 01/27/20.  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 decr hearing L side    Limitations fall risk, L eye patched due to inability to close eye    Patient Stated Goals wants to be able to drive, improve balance, perform financial management, be able to take care of mother    Currently in Pain? No/denies              Discussed progress.  Pt reports that she is started ordering groceries online and getting them delivered and is doing well.  Pt reports that she is not using shower seat (for past 3 weeks) without difficulty and discussed removing it so it's not in the way.  Pt reports that she is doing well with financial management without supervision.   Simple environmental scanning with ambulation with 100% accuracy.  Playing "spot it" for improved attention, reaction time, and visual scanning with min difficulty.        OT Short Term Goals - 04/05/20 6720      OT SHORT TERM GOAL #1   Title Pt will be independent  with initial HEP for LUE coordination and strength.--check STGs 03/31/20    Time 6    Period Weeks    Status Achieved    Target Date 03/31/20      OT SHORT TERM GOAL #2   Title Pt will improve L hand coordination for ADLs as shown by improving time on 9-hole peg test by at least 12sec.    Baseline 40.40sec    Time 6    Period Weeks    Status On-going   31.03 sec     OT SHORT TERM GOAL #3   Title Pt will be able to perform tabletop scanning with at least 95% accuracy for incr attention and IADL tasks.    Time 6    Period Weeks    Status Achieved      OT SHORT TERM GOAL #4   Title Pt will perform simple home maintenance tasks mod I.    Time 6    Period Weeks    Status Achieved   03/07/20:  sweeping, laundry, dishes, wiping counters     OT SHORT TERM GOAL #5   Title Pt will perform simple financial management tasks with min A.    Time 6    Period Weeks    Status Achieved   Pt required min assist to correct 1 mistake (subtracting deposit vs. adding)            OT Long Term Goals - 04/05/20 0829      OT LONG TERM GOAL #1   Title Pt will be independent with updated HEP.--check LTGs 05/15/20    Time 12    Period Weeks    Status New      OT LONG TERM GOAL #2   Title Pt will improve L grip strength by at least 8lbs to assist with home maintenance tasks/in prep for yardwork.    Time 12    Period Weeks    Status New      OT LONG TERM GOAL #3   Title Pt will demo improved balance and strength to be able to water plants.    Time 12    Period Weeks    Status Achieved      OT LONG TERM GOAL #4   Title Pt will perform environmental scanning with at least 90% accuracy for incr safety for community activities.    Time 12    Period Weeks    Status On-going      OT LONG TERM GOAL #5   Title Pt will perform previous financial management tasks with supervision.    Time 12    Period Weeks    Status On-going      OT LONG TERM GOAL #6   Title Pt will be able to alternate  attention between at least 1 physical and 1 cognitive task with  at least 90% accuracy for incr safety with IADLs.    Time 12    Period Weeks    Status New                 Plan - 04/10/20 0906    Clinical Impression Statement Pt continues to report incr independence with IADLs and is progressing towards goals.    Occupational performance deficits (Please refer to evaluation for details): ADL's;IADL's;Work;Leisure;Other    Body Structure / Function / Physical Skills ADL;Hearing;Vision;IADL;Balance;Mobility;Strength;Coordination;FMC;UE functional use    Cognitive Skills Attention;Memory;Perception;Thought    Rehab Potential Good    OT Frequency 2x / week    OT Duration 12 weeks    OT Treatment/Interventions Self-care/ADL training;DME and/or AE instruction;Therapeutic activities;Aquatic Therapy;Therapeutic exercise;Cognitive remediation/compensation;Visual/perceptual remediation/compensation;Functional Mobility Training;Neuromuscular education;Patient/family education    Plan Lt grip strength, environmental scanning in busy environment, divided attention    Consulted and Agree with Plan of Care Patient           Patient will benefit from skilled therapeutic intervention in order to improve the following deficits and impairments:   Body Structure / Function / Physical Skills: ADL, Hearing, Vision, IADL, Balance, Mobility, Strength, Coordination, FMC, UE functional use Cognitive Skills: Attention, Memory, Perception, Thought     Visit Diagnosis: Other lack of coordination  Muscle weakness (generalized)  Attention and concentration deficit  Unsteadiness on feet  Other abnormalities of gait and mobility  Frontal lobe and executive function deficit    Problem List Patient Active Problem List   Diagnosis Date Noted  . Cranial nerve VII palsy   . Hypoalbuminemia due to protein-calorie malnutrition ([place])   . Acute blood loss anemia   . Sinus tachycardia   . Postoperative  pain   . 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 Protein-calorie malnutrition, severe 01/30/2020  . Brain tumor ([place]) 01/27/2020  . Low folate 01/23/2020  . Anemia of chronic disease 01/23/2020  . Small bowel obstruction from retained endoscopy capsule at stricture s/p ileal resection 01/19/2020 01/18/2020  . 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 Primary localized osteoarthritis of left hip 2019/08/30  . Death of family member-  husband passed Jul 2020 04/29/2019  . 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 Sleep difficulties 09/09/2018  . Pre-diabetes 03/09/2018  . Postmenopausal state- went thru early 40's 01/02/2018  . Primary osteoarthritis of left hip 01/02/2018  . Caregiver burden- for husband with ALLeukemia 01/02/2018  . Left hip pain 01/02/2018  . Osteopenia 07/31/2016  . Hypertension 05/20/2011  . Menopausal state 05/20/2011    [facility] 04/10/2020, 3:10 PM  [facility] [address] [place], [place], [address] Phone: [phone]   Fax:  [phone]  Name: Cristina Conner MRN: [id number] Date of Birth: November 21, 1960   Vianne Bulls, OTR/L [facility] [address] [place],   [address] [phone] phone [phone] 04/10/20 3:10 PM

## 2020-04-10 NOTE — Therapy (Signed)
[facility] [address] [place], [place], [address] Phone: [phone]   Fax:  [phone]  Physical Therapy Treatment  Patient Details  Name: Cristina Conner MRN: [id number] Date of Birth: 1960-10-09 Referring Provider (PT): Reesa Chew, [place]   Encounter Date: 04/10/2020   PT End of Session - 04/10/20 0809    Visit Number 15    Number of Visits 17    Date for PT Re-Evaluation 04/14/20    Authorization Type BCBS - [place]    PT Start Time 0804    PT Stop Time 0845    PT Time Calculation (min) 41 min    Equipment Utilized During Treatment Gait belt    Activity Tolerance Patient tolerated treatment well    Behavior During Therapy [facility] for tasks assessed/performed           Past Medical History:  Diagnosis Date  . Anxiety   . Arthritis   . Diverticulosis   . GERD (gastroesophageal reflux disease)   . Hypertension   . Osteopenia   . Post-operative nausea and vomiting    vomiting after general anesthesia per pt.  . Pre-diabetes     Past Surgical History:  Procedure Laterality Date  . CESAREAN SECTION     x 2  . COLONOSCOPY    . CRANIOTOMY Left 01/27/2020   Procedure: Left Craniotomy for Tumor Resection;  Surgeon: Judith Part, MD;  Location: [facility];  Service: Neurosurgery;  Laterality: Left;  . ENDOMETRIAL ABLATION    . ESOPHAGOGASTRODUODENOSCOPY  10/2019  . LAPAROSCOPY N/A 01/19/2020   Procedure: LAPAROSCOPY DIAGNOSTIC LAPAROTOMY WITH SMALL BOWEL RESECTION;  Surgeon: Ileana Roup, MD;  [facility];  Service: General;  Laterality: N/A;  . PELVIC LAPAROSCOPY  1999   left salpingectomy, excision of Right, paratubal cyst  . PELVIC LAPAROSCOPY     laparoscopy with lysis of pelvic adhesions  . SHOULDER SURGERY  11/2010   "FROZEN SHOULDER"  . TOTAL HIP ARTHROPLASTY Left 08/17/2019   Procedure: LEFT TOTAL HIP ARTHROPLASTY ANTERIOR APPROACH;  Surgeon: Melrose Nakayama, MD;  [facility];   Service: Orthopedics;  Laterality: Left;  . TUBAL LIGATION    . UPPER GASTROINTESTINAL ENDOSCOPY    . VENTRICULOSTOMY Left 01/27/2020   Procedure: VENTRICULOSTOMY;  Surgeon: Judith Part, MD;  Location: [facility];  Service: Neurosurgery;  Laterality: Left;    There were no vitals filed for this visit.   Subjective Assessment - 04/10/20 0806    Subjective No new complaints. Saw eye doctor this past Friday with him stating "things are moving in the right direction". He is also referring her to a cornea specialist on Aug 18th. No falls.    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 Yes    Pain Score 2     Pain Location Generalized   left cheeck below her eye   Pain Orientation Left    Pain Descriptors / Indicators Sore;Tender    Pain Type Acute pain    Pain Onset In the past 7 days    Pain Frequency Intermittent    Aggravating Factors  touching the area  Pain Relieving Factors tylenol              [facility] PT Assessment - 04/10/20 0812      Transfers   Transfers Stand to Sit;Sit to Stand    Sit to Stand 5: Supervision;Without upper extremity assist    Stand to Sit 5: Supervision;Without upper extremity assist      Ambulation/Gait   Ambulation/Gait Yes    Ambulation/Gait Assistance 5: Supervision    Ambulation/Gait Assistance Details minor veering noted at times with no overt loss of balance.    Ambulation Distance (Feet) 1000 Feet    Assistive device None    Gait Pattern Step-through pattern;Decreased arm swing - left    Ambulation Surface Level;Unlevel;Indoor;Outdoor;Paved      Berg Balance Test   Sit to Stand Able to stand without using hands and  stabilize independently    Standing Unsupported Able to stand safely 2 minutes    Sitting with Back Unsupported but Feet Supported on Floor or Stool Able to sit safely and securely 2 minutes    Stand to Sit Sits safely with minimal use of hands    Transfers Able to transfer safely, minor use of hands    Standing Unsupported with Eyes Closed Able to stand 10 seconds safely    Standing Unsupported with Feet Together Able to place feet together independently and stand 1 minute safely    From Standing, Reach Forward with Outstretched Arm Can reach confidently >25 cm (10")    From Standing Position, Pick up Object from Floor Able to pick up shoe safely and easily    From Standing Position, Turn to Look Behind Over each Shoulder Looks behind from both sides and weight shifts well    Turn 360 Degrees Able to turn 360 degrees safely in 4 seconds or less    Standing Unsupported, Alternately Place Feet on Step/Stool Able to stand independently and safely and complete 8 steps in 20 seconds   10.09 sec's   Standing Unsupported, One Foot in Front Able to place foot tandem independently and hold 30 seconds    Standing on One Leg Able to lift leg independently and hold > 10 seconds    Total Score 56    Berg comment: 56/56      Functional Gait  Assessment   Gait assessed  Yes    Gait Level Surface Walks 20 ft in less than 7 sec but greater than 5.5 sec, uses assistive device, slower speed, mild gait deviations, or deviates 6-10 in outside of the 12 in walkway width.   6.79 sec's   Change in Gait Speed Able to smoothly change walking speed without loss of balance or gait deviation. Deviate no more than 6 in outside of the 12 in walkway width.    Gait with Horizontal Head Turns Performs head turns smoothly with no change in gait. Deviates no more than 6 in outside 12 in walkway width    Gait with Vertical Head Turns Performs head turns with no change in gait. Deviates no more than 6 in outside 12 in walkway  width.    Gait and Pivot Turn Pivot turns safely within 3 sec and stops quickly with no loss of balance.    Step Over Obstacle Is able to step over 2 stacked shoe boxes taped together (9 in total height) without changing gait speed. No evidence of imbalance.    Gait with Narrow Base of Support Ambulates 7-9 steps.   mild "shaking" at ankles, no  step out noted   Gait with Eyes Closed Walks 20 ft, uses assistive device, slower speed, mild gait deviations, deviates 6-10 in outside 12 in walkway width. Ambulates 20 ft in less than 9 sec but greater than 7 sec.   7.31 sec's   Ambulating Backwards Walks 20 ft, uses assistive device, slower speed, mild gait deviations, deviates 6-10 in outside 12 in walkway width.   18.79 sec's   Steps Alternating feet, must use rail.    Total Score 25    FGA comment: 25/30- low risk for falling                   PT Short Term Goals - 03/29/20 1029      PT SHORT TERM GOAL #1   Title Improve Berg balance score by at least 4 points for reduced fall risk.    Baseline BERG assessed on 02/17/20, scored 50/56; 53/56 on 03/15/20    Time 4    Period Weeks    Status On-going    Target Date 03/17/20      PT SHORT TERM GOAL #2   Title Pt will improve TUG score from 14.57 secs to </= 12 secs without use of SPC for reduced fall risk.    Baseline 14.57 secs - pt carried cane, 9.62 w/o AD    Time 4    Period Weeks    Status Achieved    Target Date 03/17/20      PT SHORT TERM GOAL #3   Title Pt will ambulate 500' without use of SPC on flat even surfaces with SBA for incr. community accessibility.    Baseline 59o' w/ no AD, Mod I    Time 4    Period Weeks    Status Achieved    Target Date 03/17/20      PT SHORT TERM GOAL #4   Title Pt will amb. 63' with horizontal head turns without device with supervision for safety with environmental scanning.    Baseline 115' w/ horizontal head turns, no AD, supervision level    Time 4    Period Weeks    Status Achieved     Target Date 03/17/20      PT SHORT TERM GOAL #5   Title Pt will participate in aquatic therapy after permission obtained from MD (craniotomy incision healed).    Baseline not initiated aquatic therapy at this time    Time 4    Period Weeks    Status On-going    Target Date 03/17/20      PT SHORT TERM GOAL #6   Title Independent in balance HEP.    Baseline Reports independence with exercises, only completing 4/7 days of week    Time 4    Period Weeks    Status Partially Met    Target Date 03/17/20      PT SHORT TERM GOAL #7   Title Perform SOT & establish goal as appropriate.    Baseline performed on 03/22/20 - LTG written as appropriate.    Time 4    Period Weeks    Status Achieved    Target Date 04/21/20             PT Long Term Goals - 04/10/20 0810      PT LONG TERM GOAL #1   Title Pt will improve FGA score by at least 8 points from baseline score for incr. safety with amb.    Baseline 04/10/20: pt improved to 25/30 (was  17/30)    Time --    Period --    Status Achieved      PT LONG TERM GOAL #2   Title Pt will improve Berg balance test by at least 10 points from baseline for reduced fall risk.    Baseline 04/10/20: pt acheived max score of 56/56    Time --    Period Weeks    Status Achieved      PT LONG TERM GOAL #3   Title Pt will amb. 1000' on flat even/uneven srufaces without device with supervision.    Baseline 04/10/20: met in session today on indoor/outdoor paved surfaces    Time --    Period --    Status Achieved      PT LONG TERM GOAL #4   Title Pt will be independent in updated HEP including aquatic exercises at time of D/C from PT.    Time 8    Period Weeks    Status On-going      PT LONG TERM GOAL #5   Title Pt will amb. 72' with head turns with no c/o dizziness and no imbalance for incr. safety with environmental scanning.    Time 8    Period Weeks    Status On-going      PT LONG TERM GOAL #6   Title Pt will improve composite score of SOT  to at least 70 in order to demo improved vestibular input for balance.    Baseline composite score: 50    Time 8    Period Weeks    Status On-going                 Plan - 04/10/20 0810    Clinical Impression Statement Today's skilled session began to address progress toward LTGs due to cert ending on 0/16/01. Pt met her Merrilee Jansky Balance Test goal with score of 56/56, she improved her Functional Gait Index score by 8 points with a score of 25/30. Pt also met her gait outdoor goal with no device. Will plan to check remaining goals at next session for anticipated recert.    Personal Factors and Comorbidities Comorbidity 2;Transportation;Behavior Pattern;Comorbidity 3+    Comorbidities cranial nerve VII palsy, craniotomy on 01-27-20 for tumor resection, unilateral vestibular schwannoma, anxiety,, s/p Lt THR Dec. 2020    Examination-Activity Limitations Transfers;Locomotion Level;Squat;Stairs;Stand;Carry    Examination-Participation Restrictions Meal Prep;Cleaning;Community Activity;Driving;Shop;Laundry;Interpersonal Relationship    Stability/Clinical Decision Making Evolving/Moderate complexity    Rehab Potential Good    PT Frequency 2x / week    PT Duration 8 weeks    PT Treatment/Interventions Aquatic Therapy;Gait training;Stair training;Therapeutic activities;Therapeutic exercise;Balance training;Neuromuscular re-education;Patient/family education;Vestibular    PT Next Visit Plan check remaining goals for anticipated recert    Consulted and Agree with Plan of Care Patient;Family member/caregiver    Family Member Consulted [name]           Patient will benefit from skilled therapeutic intervention in order to improve the following deficits and impairments:  Abnormal gait, Decreased balance, Decreased activity tolerance, Decreased coordination, Dizziness, Impaired vision/preception  Visit Diagnosis: Muscle weakness (generalized)  Unsteadiness on feet  Other abnormalities of  gait and mobility  Dizziness and giddiness     Problem List Patient Active Problem List   Diagnosis Date Noted  . Cranial nerve VII palsy   . Hypoalbuminemia due to protein-calorie malnutrition ([place])   . Acute blood loss anemia   . Sinus tachycardia   . Postoperative pain   . 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 Protein-calorie malnutrition, severe 01/30/2020  . Brain tumor ([place]) 01/27/2020  . Low folate 01/23/2020  . Anemia of chronic disease 01/23/2020  . Small bowel obstruction from retained endoscopy capsule at stricture s/p ileal resection 01/19/2020 01/18/2020  . 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 Primary localized osteoarthritis of left hip 04-Sep-2019  . Death of family member-  husband passed Jul 2020 04/29/2019  . 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 Sleep difficulties 09/09/2018  . Pre-diabetes 03/09/2018  . Postmenopausal state- went thru early 40's 01/02/2018  . Primary osteoarthritis of left hip 01/02/2018  . Caregiver burden- for husband with ALLeukemia 01/02/2018  . Left hip pain 01/02/2018  . Osteopenia 07/31/2016  . Hypertension 05/20/2011  . Menopausal state 05/20/2011    Willow Ora, PTA, [facility] [address] [place], [place] [address] [phone] 04/10/20, 10:40 AM   Name: KENSINGTON RIOS MRN: [id number] Date of Birth: 06-Jul-1961

## 2020-04-12 ENCOUNTER — Ambulatory Visit: Payer: BC Managed Care – PPO | Admitting: Physical Therapy

## 2020-04-12 ENCOUNTER — Other Ambulatory Visit: Payer: Self-pay

## 2020-04-12 ENCOUNTER — Ambulatory Visit: Payer: BC Managed Care – PPO | Admitting: Occupational Therapy

## 2020-04-12 ENCOUNTER — Encounter: Payer: BC Managed Care – PPO | Admitting: Speech Pathology

## 2020-04-12 DIAGNOSIS — M6281 Muscle weakness (generalized): Secondary | ICD-10-CM

## 2020-04-12 DIAGNOSIS — R278 Other lack of coordination: Secondary | ICD-10-CM

## 2020-04-12 DIAGNOSIS — R2689 Other abnormalities of gait and mobility: Secondary | ICD-10-CM

## 2020-04-12 DIAGNOSIS — R2681 Unsteadiness on feet: Secondary | ICD-10-CM

## 2020-04-12 DIAGNOSIS — R41840 Attention and concentration deficit: Secondary | ICD-10-CM

## 2020-04-12 NOTE — Patient Instructions (Signed)
Access Code: [email or web address] URL: [email or web address] Date: 04/12/2020 Prepared by: Janann August  Exercises Standing Marching - 2 x daily - 5 x weekly - 3 sets Standing Tandem Balance with Counter Support - 2 x daily - 5 x weekly - 3 sets - 15-20 hold Standing Hip Abduction with Counter Support - 1 x daily - 5 x weekly - 2 sets - 10 reps Standing with Head Rotation - 1 x daily - 5 x weekly - 2 sets - 10 reps Single Leg Stance - 1 x daily - 5 x weekly - 1 sets - 3 reps - 30 hold Romberg Stance Eyes Closed on Foam Pad - 1 x daily - 7 x weekly - 3 sets - 30 hold

## 2020-04-12 NOTE — Therapy (Signed)
[facility] [address] [place], [place], [address] Phone: [phone]   Fax:  [phone]  Physical Therapy Treatment/Re-Cert  Patient Details  Name: Cristina Conner MRN: [id number] Date of Birth: [birth year] Referring Provider (PT): Dr. Alger Simons   Encounter Date: 04/12/2020   PT End of Session - 04/12/20 1132    Visit Number 16    Number of Visits 22    Date for PT Re-Evaluation 06/11/20   written for 4 week POC   Authorization Type [employer]BS - [place]    PT Start Time 0803    PT Stop Time 0847    PT Time Calculation (min) 44 min    Equipment Utilized During Treatment Gait belt    Activity Tolerance Patient tolerated treatment well    Behavior During Therapy [facility] for tasks assessed/performed           Past Medical History:  Diagnosis Date  . Anxiety   . Arthritis   . Diverticulosis   . GERD (gastroesophageal reflux disease)   . Hypertension   . Osteopenia   . Post-operative nausea and vomiting    vomiting after general anesthesia per pt.  . Pre-diabetes     Past Surgical History:  Procedure Laterality Date  . CESAREAN SECTION     x 2  . COLONOSCOPY    . CRANIOTOMY Left 01/27/2020   Procedure: Left Craniotomy for Tumor Resection;  Surgeon: Judith Part, MD;  Location: [facility];  Service: Neurosurgery;  Laterality: Left;  . ENDOMETRIAL ABLATION    . ESOPHAGOGASTRODUODENOSCOPY  10/2019  . LAPAROSCOPY N/A 01/19/2020   Procedure: LAPAROSCOPY DIAGNOSTIC LAPAROTOMY WITH SMALL BOWEL RESECTION;  Surgeon: Ileana Roup, MD;  Location: [facility];  Service: General;  Laterality: N/A;  . PELVIC LAPAROSCOPY  1999   left salpingectomy, excision of Right, paratubal cyst  . PELVIC LAPAROSCOPY     laparoscopy with lysis of pelvic adhesions  . SHOULDER SURGERY  11/2010   "FROZEN SHOULDER"  . TOTAL HIP ARTHROPLASTY Left 08/17/2019   Procedure: LEFT TOTAL HIP ARTHROPLASTY ANTERIOR APPROACH;  Surgeon: Melrose Nakayama, MD;  Location[facility];  Service: Orthopedics;  Laterality: Left;  . TUBAL LIGATION    . UPPER GASTROINTESTINAL ENDOSCOPY    . VENTRICULOSTOMY Left 01/27/2020   Procedure: VENTRICULOSTOMY;  Surgeon: Judith Part, MD;  Location: [facility];  Service: Neurosurgery;  Laterality: Left;    There were no vitals filed for this visit.   Subjective Assessment - 04/12/20 0806    Subjective No new changes since the last time she was here. Going to the eye doctor more. Also started a new medication for her cornea.    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 Alger Simons    Onset Date/Surgical Date 01/27/20      Prior Function  Level of Independence Independent                         [facility] Adult PT Treatment/Exercise - 04/12/20 0834      Ambulation/Gait   Ambulation/Gait Yes    Ambulation/Gait Assistance 5: Supervision    Ambulation/Gait Assistance Details while scanning environment and performing L/R head turns, no LOB but pt slowly down gait speed    Ambulation Distance (Feet) 115 Feet    Assistive device None    Gait Pattern Step-through pattern;Decreased arm swing - left    Ambulation Surface Level;Indoor              Access Code: [email or web address] URL: [email or web address] Date: 04/12/2020 Prepared by: Janann August  Upgraded and reviewed bolded exercise below, due to time constraints unable to review  other exercises  Exercises Standing Marching - 2 x daily - 5 x weekly - 3 sets Standing Tandem Balance with Counter Support - 2 x daily - 5 x weekly - 3 sets - 15-20 hold Standing Hip Abduction with Counter Support - 1 x daily - 5 x weekly - 2 sets - 10 reps Standing with Head Rotation - 1 x daily - 5 x weekly - 2 sets - 10 reps Single Leg Stance - 1 x daily - 5 x weekly - 1 sets - 3 reps - 30 hold Romberg Stance Eyes Closed on Foam Pad - 1 x daily - 7 x weekly - 3 sets - 30 hold      Neuro re-ed: sensory organization test performed with following results: Conditions: 1: pt below age related norm on 1st trial due to pt unaware test was starting, remainder of 2 trials above age related norm 2: 1st and 3rd trial above age related norm, 2nd just slightly below 3: 2nd trial above normal, 1st and 3rd below age norm           4: all 4 above age related normal 5: first trial marked as fall, 2nd trial below age related norm, 3rd trial above - indicating improved ability for pt's balance to react to incr practice  6: 2 trials marked as fall, last trial below age related norm  Composite score: 46 (below age related norm) Sensory Analysis Som: Above age related norm (approx 41)  Vis: Above age related norm (approx 15)  Vest: below age related norm (approx 57)  Pref: Patient demo below norm for preference, however very close Strategy analysis: for the majority of time able to use hip/ankle strategy appropriately, but continues to lean more toward utilization of ankle strategy.       COG alignment: patient demo midline to L posterior alignment          PT Education - 04/12/20 1131    Education Details upgraded feet together eyes closed on pillow for HEP and educated on importance of performing for incr vestibular input for balance, progress towards goals, extending POC for an additional month    Person(s) Educated Patient   pt's daughter   Methods Explanation;Demonstration;Handout     Comprehension Verbalized understanding;Returned demonstration            PT Short Term Goals - 03/29/20 1029      PT SHORT TERM GOAL #1   Title Improve Berg balance score by at least 4 points for reduced fall risk.    Baseline BERG assessed on 02/17/20, scored 50/56; 53/56 on 03/15/20    Time 4    Period Weeks  Status On-going    Target Date 03/17/20      PT SHORT TERM GOAL #2   Title Pt will improve TUG score from 14.57 secs to </= 12 secs without use of SPC for reduced fall risk.    Baseline 14.57 secs - pt carried cane, 9.62 w/o AD    Time 4    Period Weeks    Status Achieved    Target Date 03/17/20      PT SHORT TERM GOAL #3   Title Pt will ambulate 500' without use of SPC on flat even surfaces with SBA for incr. community accessibility.    Baseline 59o' w/ no AD, Mod I    Time 4    Period Weeks    Status Achieved    Target Date 03/17/20      PT SHORT TERM GOAL #4   Title Pt will amb. 51' with horizontal head turns without device with supervision for safety with environmental scanning.    Baseline 115' w/ horizontal head turns, no AD, supervision level    Time 4    Period Weeks    Status Achieved    Target Date 03/17/20      PT SHORT TERM GOAL #5   Title Pt will participate in aquatic therapy after permission obtained from MD (craniotomy incision healed).    Baseline not initiated aquatic therapy at this time    Time 4    Period Weeks    Status On-going    Target Date 03/17/20      PT SHORT TERM GOAL #6   Title Independent in balance HEP.    Baseline Reports independence with exercises, only completing 4/7 days of week    Time 4    Period Weeks    Status Partially Met    Target Date 03/17/20      PT SHORT TERM GOAL #7   Title Perform SOT & establish goal as appropriate.    Baseline performed on 03/22/20 - LTG written as appropriate.    Time 4    Period Weeks    Status Achieved    Target Date 04/21/20             PT Long Term Goals - 04/12/20  0829      PT LONG TERM GOAL #1   Title Pt will improve FGA score by at least 8 points from baseline score for incr. safety with amb.    Baseline 04/10/20: pt improved to 25/30 (was 17/30)    Status Achieved      PT LONG TERM GOAL #2   Title Pt will improve Berg balance test by at least 10 points from baseline for reduced fall risk.    Baseline 04/10/20: pt acheived max score of 56/56    Period Weeks    Status Achieved      PT LONG TERM GOAL #3   Title Pt will amb. 1000' on flat even/uneven srufaces without device with supervision.    Baseline 04/10/20: met in session today on indoor/outdoor paved surfaces    Status Achieved      PT LONG TERM GOAL #4   Title Pt will be independent in updated HEP including aquatic exercises at time of D/C from PT.    Time 8    Period Weeks    Status On-going      PT LONG TERM GOAL #5   Title Pt will amb. 31' with head turns with no c/o dizziness and no imbalance for incr.  safety with environmental scanning.    Baseline no dizziness, slightly slowed down gait speed.    Time 8    Period Weeks    Status Achieved      PT LONG TERM GOAL #6   Title Pt will improve composite score of SOT to at least 70 in order to demo improved vestibular input for balance.    Baseline composite score: 50, 58 on 04/12/20    Time 8    Period Weeks    Status Not Met           Revised LTGs for re-cert:  PT Long Term Goals - 04/12/20 1146      PT LONG TERM GOAL #1   Title Pt will improve FGA score to at least a 27/30 in order to demo decr fall risk.ALL LTGS DUE 04/14/20    Baseline 04/10/20: pt improved to 25/30 (was 17/30)    Time 4    Period Weeks    Status Revised    Target Date 05/10/20      PT LONG TERM GOAL #2   Title Pt will be independent in updated HEP at time of D/C from PT.    Time 4    Period Weeks    Status On-going      PT LONG TERM GOAL #3   Title Pt will improve composite score of SOT to at least 70 in order to demo improved vestibular input for  balance.    Baseline composite score: 50, 58 on 04/12/20    Time 4    Period Weeks    Status New                Plan - 04/12/20 1142    Clinical Impression Statement Focus of today's skilled session was assessing pt's LTGs Pt met LTG #5 - able to scan environment and perform head turns with no dizziness, however does slow down gait speed but no LOB. LTG #4 is still on-going, unable to review HEP due to time constraints. Did review feet together eyes closed on compliant surface for incr vestibular input for balance. Performed the SOT again today with pt scoring above normal for somatosensory and vision and below normal for vestibular (however has improved since last assessed on 03/22/20). Pt is making excellent progress - will re-cert for an additional 4 week POC to continue to focus on PT interventions targeting pt's vestibular system to improve balance, gait, and functional mobility. LTGs revised and update as appropriate.    Personal Factors and Comorbidities Comorbidity 2;Transportation;Behavior Pattern;Comorbidity 3+    Comorbidities cranial nerve VII palsy, craniotomy on 01-27-20 for tumor resection, unilateral vestibular schwannoma, anxiety,, s/p Lt THR Dec. 2020    Examination-Activity Limitations Transfers;Locomotion Level;Squat;Stairs;Stand;Carry    Examination-Participation Restrictions Meal Prep;Cleaning;Community Activity;Driving;Shop;Laundry;Interpersonal Relationship    Stability/Clinical Decision Making Evolving/Moderate complexity    Rehab Potential Good    PT Frequency 2x / week   plus 1x week for 2 weeks   PT Duration 2 weeks   plus 1x week for 2 weeks   PT Treatment/Interventions Aquatic Therapy;Gait training;Stair training;Therapeutic activities;Therapeutic exercise;Balance training;Neuromuscular re-education;Patient/family education;Vestibular    PT Next Visit Plan review and upgrade HEP as necessary for improved vestibular input for balance. continue gait/high level  balance with focus on vestibular system    Consulted and Agree with Plan of Care Patient;Family member/caregiver    Family Member Consulted [name]           Patient will benefit from skilled therapeutic  intervention in order to improve the following deficits and impairments:  Abnormal gait, Decreased balance, Decreased activity tolerance, Decreased coordination, Dizziness, Impaired vision/preception  Visit Diagnosis: Other lack of coordination  Muscle weakness (generalized)  Unsteadiness on feet  Other abnormalities of gait and mobility     Problem List Patient Active Problem List   Diagnosis Date Noted  . Cranial nerve VII palsy   . Hypoalbuminemia due to protein-calorie malnutrition ([place])   . Acute blood loss anemia   . Sinus tachycardia   . Postoperative pain   . 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 Protein-calorie malnutrition, severe 01/30/2020  . Brain tumor ([place]) 01/27/2020  . Low folate 01/23/2020  . Anemia of chronic disease 01/23/2020  . Small bowel obstruction from retained endoscopy capsule at stricture s/p ileal resection 01/19/2020 01/18/2020  . 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 Primary localized osteoarthritis of left hip September 11, 2019  . Death of family member-  husband passed Jul 2020 04/29/2019  . 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 Sleep difficulties 09/09/2018  . Pre-diabetes 03/09/2018  . Postmenopausal state- went thru early 40's 01/02/2018  . Primary osteoarthritis of left hip 01/02/2018  . Caregiver burden- for husband with ALLeukemia 01/02/2018  . Left hip pain 01/02/2018  . Osteopenia 07/31/2016  . Hypertension 05/20/2011  . Menopausal state 05/20/2011    Arliss Journey, PT, DPT  04/12/2020, 11:44 AM  [facility] [address] [place], [place], [address] Phone: [phone]   Fax:  [phone]  Name:  Cristina Conner MRN: [id number] Date of Birth: 1961-01-30

## 2020-04-12 NOTE — Therapy (Signed)
[facility] [address] [place], [place], [address] Phone: [phone]   Fax:  [phone]  Occupational Therapy Treatment  Patient Details  Name: Cristina Conner MRN: [id number] Date of Birth: 1961/03/03 Referring Provider (OT): Dr. Alger Simons   Encounter Date: 04/12/2020   OT End of Session - 04/12/20 1204    Visit Number 10    Number of Visits 25    Date for OT Re-Evaluation 05/15/20    Authorization Type BCBS covered 100%, no visit limit    OT Start Time 0845    OT Stop Time 0930    OT Time Calculation (min) 45 min    Activity Tolerance Patient tolerated treatment well    Behavior During Therapy [facility] for tasks assessed/performed           Past Medical History:  Diagnosis Date  . Anxiety   . Arthritis   . Diverticulosis   . GERD (gastroesophageal reflux disease)   . Hypertension   . Osteopenia   . Post-operative nausea and vomiting    vomiting after general anesthesia per pt.  . Pre-diabetes     Past Surgical History:  Procedure Laterality Date  . CESAREAN SECTION     x 2  . COLONOSCOPY    . CRANIOTOMY Left 01/27/2020   Procedure: Left Craniotomy for Tumor Resection;  Surgeon: Judith Part, MD;  Location: [facility];  Service: Neurosurgery;  Laterality: Left;  . ENDOMETRIAL ABLATION    . ESOPHAGOGASTRODUODENOSCOPY  10/2019  . LAPAROSCOPY N/A 01/19/2020   Procedure: LAPAROSCOPY DIAGNOSTIC LAPAROTOMY WITH SMALL BOWEL RESECTION;  Surgeon: Ileana Roup, MD;  [facility];  Service: General;  Laterality: N/A;  . PELVIC LAPAROSCOPY  1999   left salpingectomy, excision of Right, paratubal cyst  . PELVIC LAPAROSCOPY     laparoscopy with lysis of pelvic adhesions  . SHOULDER SURGERY  11/2010   "FROZEN SHOULDER"  . TOTAL HIP ARTHROPLASTY Left 08/17/2019   Procedure: LEFT TOTAL HIP ARTHROPLASTY ANTERIOR APPROACH;  Surgeon: Melrose Nakayama, MD;  Location[facility];  Service: Orthopedics;   Laterality: Left;  . TUBAL LIGATION    . UPPER GASTROINTESTINAL ENDOSCOPY    . VENTRICULOSTOMY Left 01/27/2020   Procedure: VENTRICULOSTOMY;  Surgeon: Judith Part, MD;  Location: [facility];  Service: Neurosurgery;  Laterality: Left;    There were no vitals filed for this visit.   Subjective Assessment - 04/12/20 0853    Subjective  I still have trouble picking up really small things like my pills with my Lt hand    Patient is accompanied by: Family member   daughter   Pertinent History Unilateral vestibular schwannoma, s/p craniotomy for tumor resection 01/27/20.  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 decr hearing L side    Limitations fall risk, L eye patched due to inability to close eye    Patient Stated Goals wants to be able to drive, improve balance, perform financial management, be able to take care of mother    Currently in Pain? No/denies              [facility] OT Assessment - 04/12/20 0001      Hand Function   Left Hand Grip (lbs) 54.2 lbs          Gripper  set at level 3 resistance to pick up blocks Lt hand for sustained grip strength.  Environmental scanning while tossing ball Lt hand with mod to max drops, Rt hand min drops finding 13/14 items on first pass (93% accuracy). Pt then ambulating while tossing ball switching hands (more drops LUE) and performing category generation (naming foods alphabetically) w/ significantly decreased gait speed and pattern changes, min cues for naming/listing, and mod to max drops Lt hand, min drops Rt hand (partly d/t vision).  Discussed ways to incorporate divided attention tasks at home (watching news while folding clothes, cooking 2 things together, etc)                      OT Short Term Goals - 04/12/20 1204      OT SHORT TERM GOAL #1   Title Pt will be independent with initial HEP for LUE coordination and strength.--check STGs 03/31/20    Time 6    Period Weeks    Status Achieved    Target Date 03/31/20      OT SHORT TERM GOAL #2   Title Pt will improve L hand coordination for ADLs as shown by improving time on 9-hole peg test by at least 12sec.    Baseline 40.40sec    Time 6    Period Weeks    Status On-going   31.03 sec     OT SHORT TERM GOAL #3   Title Pt will be able to perform tabletop scanning with at least 95% accuracy for incr attention and IADL tasks.    Time 6    Period Weeks    Status Achieved      OT SHORT TERM GOAL #4   Title Pt will perform simple home maintenance tasks mod I.    Time 6    Period Weeks    Status Achieved   03/07/20:  sweeping, laundry, dishes, wiping counters     OT SHORT TERM GOAL #5   Title Pt will perform simple financial management tasks with min A.    Time 6    Period Weeks    Status Achieved   Pt required min assist to correct 1 mistake (subtracting deposit vs. adding)            OT Long Term Goals - 04/12/20 1205      OT LONG TERM GOAL #1   Title Pt will be independent with updated HEP.--check LTGs 05/15/20    Time 12    Period Weeks    Status New      OT LONG TERM GOAL #2   Title Pt will improve L grip strength by at least 8lbs to assist with home maintenance tasks/in prep for yardwork.    Baseline 45 LBS    Time 12    Period Weeks    Status Achieved   54.2 LBS     OT LONG TERM GOAL #3   Title Pt will demo improved balance and strength to be able to water plants.    Time 12    Period Weeks    Status Achieved      OT LONG TERM GOAL #4   Title Pt will perform environmental scanning with at least 90% accuracy for incr safety for community activities.    Time 12    Period Weeks    Status Achieved   93% with distractions     OT LONG TERM GOAL #5    Title Pt will perform previous financial  management tasks with supervision.    Time 12    Period Weeks    Status On-going   met at home, pt w/ min errors in clinic w/ 5 transactions     OT LONG TERM GOAL #6   Title Pt will be able to alternate attention between at least 1 physical and 1 cognitive task with at least 90% accuracy for incr safety with IADLs.    Time 12    Period Weeks    Status On-going   Pt performing w/ mod drops Lt hand and decreased/modified gait speed w/ cognitive tasks                Plan - 04/12/20 1209    Clinical Impression Statement Pt has improved grip strength Lt hand and has met some LTG's at this time.    Occupational performance deficits (Please refer to evaluation for details): ADL's;IADL's;Work;Leisure;Other    Body Structure / Function / Physical Skills ADL;Hearing;Vision;IADL;Balance;Mobility;Strength;Coordination;FMC;UE functional use    Cognitive Skills Attention;Memory;Perception;Thought    Rehab Potential Good    OT Frequency 2x / week    OT Duration 12 weeks    OT Treatment/Interventions Self-care/ADL training;DME and/or AE instruction;Therapeutic activities;Aquatic Therapy;Therapeutic exercise;Cognitive remediation/compensation;Visual/perceptual remediation/compensation;Functional Mobility Training;Neuromuscular education;Patient/family education    Plan work on small pegs/coordination Lt hand, continue divided attention, discuss POC & potential d/c by 05/01/20 (therapist added 1x/wk for 2 weeks in September but unsure if needed)    Consulted and Agree with Plan of Care Patient           Patient will benefit from skilled therapeutic intervention in order to improve the following deficits and impairments:   Body Structure / Function / Physical Skills: ADL, Hearing, Vision, IADL, Balance, Mobility, Strength, Coordination, FMC, UE functional use Cognitive Skills: Attention, Memory, Perception, Thought     Visit Diagnosis: Muscle weakness  (generalized)  Other lack of coordination  Attention and concentration deficit    Problem List Patient Active Problem List   Diagnosis Date Noted  . Cranial nerve VII palsy   . Hypoalbuminemia due to protein-calorie malnutrition ([place])   . Acute blood loss anemia   . Sinus tachycardia   . Postoperative pain   . 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 Protein-calorie malnutrition, severe 01/30/2020  . Brain tumor ([place]) 01/27/2020  . Low folate 01/23/2020  . Anemia of chronic disease 01/23/2020  . Small bowel obstruction from retained endoscopy capsule at stricture s/p ileal resection 01/19/2020 01/18/2020  . 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 Primary localized osteoarthritis of left hip Aug 25, 2019  . Death of family member-  husband passed Jul 2020 04/29/2019  . 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 Sleep difficulties 09/09/2018  . Pre-diabetes 03/09/2018  . Postmenopausal state- went thru early 40's 01/02/2018  . Primary osteoarthritis of left hip 01/02/2018  . Caregiver burden- for husband with ALLeukemia 01/02/2018  . Left hip pain 01/02/2018  . Osteopenia 07/31/2016  . Hypertension 05/20/2011  . Menopausal state 05/20/2011    Carey Bullocks, OTR/L 04/12/2020, 12:11 PM  [facility] [address] [place], [place], [address] Phone: [phone]   Fax:  [phone]  Name: Cristina Conner MRN: [id number] Date of Birth: April 06, 1961

## 2020-04-17 ENCOUNTER — Ambulatory Visit: Payer: BC Managed Care – PPO | Admitting: Occupational Therapy

## 2020-04-17 ENCOUNTER — Other Ambulatory Visit: Payer: Self-pay

## 2020-04-17 ENCOUNTER — Ambulatory Visit: Payer: BC Managed Care – PPO

## 2020-04-17 DIAGNOSIS — R2689 Other abnormalities of gait and mobility: Secondary | ICD-10-CM

## 2020-04-17 DIAGNOSIS — R2681 Unsteadiness on feet: Secondary | ICD-10-CM

## 2020-04-17 DIAGNOSIS — R278 Other lack of coordination: Secondary | ICD-10-CM

## 2020-04-17 DIAGNOSIS — R41840 Attention and concentration deficit: Secondary | ICD-10-CM

## 2020-04-17 DIAGNOSIS — M6281 Muscle weakness (generalized): Secondary | ICD-10-CM

## 2020-04-17 DIAGNOSIS — R42 Dizziness and giddiness: Secondary | ICD-10-CM

## 2020-04-17 DIAGNOSIS — R41844 Frontal lobe and executive function deficit: Secondary | ICD-10-CM

## 2020-04-17 NOTE — Therapy (Signed)
[facility] °[facility] °[address] °[place], [place], [address] °Phone: [phone]   Fax:  [phone] ° °Physical Therapy Treatment ° °Patient Details  °Name: Cristina Conner °MRN: [id number] °Date of Birth: 03/22/1961 °Referring Provider (PT): Dr. Zachary Swartz ° ° °Encounter Date: 04/17/2020 ° ° PT End of Session - 04/17/20 0807   ° Visit Number 17   ° Number of Visits 22   ° Date for PT Re-Evaluation 06/11/20   written for 4 week POC  ° Authorization Type [place] - [place]   ° PT Start Time 0803   ° PT Stop Time 0845   ° PT Time Calculation (min) 42 min   ° Equipment Utilized During Treatment Gait belt   ° Activity Tolerance Patient tolerated treatment well   ° Behavior During Therapy WFL for tasks assessed/performed   °  °  °  ° ° °Past Medical History:  °Diagnosis Date  °• Anxiety   °• Arthritis   °• Diverticulosis   °• GERD (gastroesophageal reflux disease)   °• Hypertension   °• Osteopenia   °• Post-operative nausea and vomiting   ° vomiting after general anesthesia per pt.  °• Pre-diabetes   ° ° °Past Surgical History:  °Procedure Laterality Date  °• CESAREAN SECTION    ° x 2  °• COLONOSCOPY    °• CRANIOTOMY Left 01/27/2020  ° Procedure: Left Craniotomy for Tumor Resection;  Surgeon: Ostergard, Thomas A, MD;  [facility];  Service: Neurosurgery;  Laterality: Left;  °• ENDOMETRIAL ABLATION    °• ESOPHAGOGASTRODUODENOSCOPY  10/2019  °• LAPAROSCOPY N/A 01/19/2020  ° Procedure: LAPAROSCOPY DIAGNOSTIC LAPAROTOMY WITH SMALL BOWEL RESECTION;  Surgeon: White, Christopher M, MD;  Location: [facility];  Service: General;  Laterality: N/A;  °• PELVIC LAPAROSCOPY  1999  ° left salpingectomy, excision of Right, paratubal cyst  °• PELVIC LAPAROSCOPY    ° laparoscopy with lysis of pelvic adhesions  °• SHOULDER SURGERY  11/2010  ° "FROZEN SHOULDER"  °• TOTAL HIP ARTHROPLASTY Left 08/17/2019  ° Procedure: LEFT TOTAL HIP ARTHROPLASTY ANTERIOR APPROACH;  Surgeon: Dalldorf, Peter, MD;   [facility];  Service: Orthopedics;  Laterality: Left;  °• TUBAL LIGATION    °• UPPER GASTROINTESTINAL ENDOSCOPY    °• VENTRICULOSTOMY Left 01/27/2020  ° Procedure: VENTRICULOSTOMY;  Surgeon: Ostergard, Thomas A, MD;  [facility];  Service: Neurosurgery;  Laterality: Left;  ° ° °There were no vitals filed for this visit. ° ° Subjective Assessment - 04/17/20 0807   ° Subjective Reports no changes/complaints. Believes HEP addition with eyes closed is challenging.   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 No/denies   ° Pain Onset In the past 7 days   °  °  °  ° ° ° ° ° ° ° ° ° ° ° ° ° ° ° ° ° ° ° ° [facility] Adult PT Treatment/Exercise - 04/17/20 0001   °  ° Ambulation/Gait  ° Ambulation/Gait Yes   ° Ambulation/Gait Assistance 5: Supervision   ° Ambulation/Gait Assistance Details completed ambulation throughout therapy gym with activities   ° Assistive device None   °   Gait Pattern Step-through pattern;Decreased arm swing - left    Ambulation Surface Level;Indoor           Vestibular Treatment/Exercise - 04/17/20 0001      Vestibular Treatment/Exercise   Gaze Exercises X1 Viewing Horizontal;X1 Viewing Vertical      X1 Viewing Horizontal   Foot Position standing on firm surface    Reps 2    Comments completed VOR x 1 with Stroop Task (focus on naming color of word, and reading word). increased difficulty with horizontal. PT providing vebral cues for smaller range and improved frequency of head turns.       X1 Viewing Vertical   Foot Position standing on firm surface    Reps 1    Comments completed VOR x 1 with Stroop Task (naming color of word). No dizziness or  difficulty              Balance Exercises - 04/17/20 0001      Balance Exercises: Standing   Standing Eyes Closed Narrow base of support (BOS);Head turns;Foam/compliant surface;Limitations    Standing Eyes Closed Limitations completed 1 x 10 reps of horizontal/vertical head turns with eyes closed on foam pad. Increased difficulty with horizontal > vertical. Completed romberg with eyes closed, patient demo difficulty with full romberg, PT educating to put 1 inch space between feet for improved completion with HEP. Completed 3 x 30 seconds.     Tandem Gait Forward;Foam/compliant surface;Limitations;4 reps    Tandem Gait Limitations completed x 4 reps on blue balance beam, down and back with intermittent UE support    Other Standing Exercises All completed with flat side of BOSU up: Completed standing on Bosu Ball with focus on maintaining steady without UE support, patient require intermittent UE support to steady, 3 x 1 minute each. Increased shakiness in BLE with standing without UE support. Completed lateral weight shift to R/L x 10 reps bilaterally without UE support. With Round side of Bosu Up: completed alternating step up with BLE x 10 reps, with opposite knee drive to promote SLS. Patient require intermittent UE support with completion.                PT Short Term Goals - 03/29/20 1029      PT SHORT TERM GOAL #1   Title Improve Berg balance score by at least 4 points for reduced fall risk.    Baseline BERG assessed on 02/17/20, scored 50/56; 53/56 on 03/15/20    Time 4    Period Weeks    Status On-going    Target Date 03/17/20      PT SHORT TERM GOAL #2   Title Pt will improve TUG score from 14.57 secs to </= 12 secs without use of SPC for reduced fall risk.    Baseline 14.57 secs - pt carried cane, 9.62 w/o AD    Time 4    Period Weeks    Status Achieved    Target Date 03/17/20      PT SHORT TERM GOAL #3   Title Pt will ambulate 500' without use of SPC on flat even  surfaces with SBA for incr. community accessibility.    Baseline 59o' w/ no AD, Mod I    Time 4    Period Weeks    Status Achieved    Target Date 03/17/20      PT SHORT TERM GOAL #4   Title Pt will amb. 33' with horizontal head turns without device with supervision for safety  with environmental scanning.   ° Baseline 115' w/ horizontal head turns, no AD, supervision level   ° Time 4   ° Period Weeks   ° Status Achieved   ° Target Date 03/17/20   °  ° PT SHORT TERM GOAL #5  ° Title Pt will participate in aquatic therapy after permission obtained from MD (craniotomy incision healed).   ° Baseline not initiated aquatic therapy at this time   ° Time 4   ° Period Weeks   ° Status On-going   ° Target Date 03/17/20   °  ° PT SHORT TERM GOAL #6  ° Title Independent in balance HEP.   ° Baseline Reports independence with exercises, only completing 4/7 days of week   ° Time 4   ° Period Weeks   ° Status Partially Met   ° Target Date 03/17/20   °  ° PT SHORT TERM GOAL #7  ° Title Perform SOT & establish goal as appropriate.   ° Baseline performed on 03/22/20 - LTG written as appropriate.   ° Time 4   ° Period Weeks   ° Status Achieved   ° Target Date 04/21/20   °  °  °  ° ° ° ° PT Long Term Goals - 04/12/20 1146   °  ° PT LONG TERM GOAL #1  ° Title Pt will improve FGA score to at least a 27/30 in order to demo decr fall risk.ALL LTGS DUE 04/14/20   ° Baseline 04/10/20: pt improved to 25/30 (was 17/30)   ° Time 4   ° Period Weeks   ° Status Revised   ° Target Date 05/10/20   °  ° PT LONG TERM GOAL #2  ° Title Pt will be independent in updated HEP at time of D/C from PT.   ° Time 4   ° Period Weeks   ° Status On-going   °  ° PT LONG TERM GOAL #3  ° Title Pt will improve composite score of SOT to at least 70 in order to demo improved vestibular input for balance.   ° Baseline composite score: 50, 58 on 04/12/20   ° Time 4   ° Period Weeks   ° Status New   °  °  °  ° ° ° ° ° ° ° ° Plan - 04/17/20 0955   ° Clinical Impression  Statement Continued balance activites today with progression onto BOSU ball and activites to promote SLS with patient tolerating well. Also completed Stroop Task with VOR x 1, with patient demo increased difficulty with horizontal > vertical. Patient will continue to benefit from skilled PT services.   ° Personal Factors and Comorbidities Comorbidity 2;Transportation;Behavior Pattern;Comorbidity 3+   ° Comorbidities cranial nerve VII palsy, craniotomy on 01-27-20 for tumor resection, unilateral vestibular schwannoma, anxiety,, s/p Lt THR Dec. 2020   ° Examination-Activity Limitations Transfers;Locomotion Level;Squat;Stairs;Stand;Carry   ° Examination-Participation Restrictions Meal Prep;Cleaning;Community Activity;Driving;Shop;Laundry;Interpersonal Relationship   ° Stability/Clinical Decision Making Evolving/Moderate complexity   ° Rehab Potential Good   ° PT Frequency 2x / week   plus 1x week for 2 weeks  ° PT Duration 2 weeks   plus 1x week for 2 weeks  ° PT Treatment/Interventions Aquatic Therapy;Gait training;Stair training;Therapeutic activities;Therapeutic exercise;Balance training;Neuromuscular re-education;Patient/family education;Vestibular   ° PT Next Visit Plan update HEP as necessary for improved vestibular input for balance. continue gait/high level balance with focus on vestibular system. Stroop Task with activites. Bosu Ball.   ° Consulted and   Agree with Plan of Care Patient;Family member/caregiver   ° Family Member Consulted daughter Julia   °  °  °  ° ° °Patient will benefit from skilled therapeutic intervention in order to improve the following deficits and impairments:  Abnormal gait, Decreased balance, Decreased activity tolerance, Decreased coordination, Dizziness, Impaired vision/preception ° °Visit Diagnosis: °Muscle weakness (generalized) ° °Unsteadiness on feet ° °Other abnormalities of gait and mobility ° °Dizziness and giddiness ° ° ° ° °Problem List °Patient Active Problem List  °  Diagnosis Date Noted  °• Cranial nerve VII palsy   °• Hypoalbuminemia due to protein-calorie malnutrition (HCC)   °• Acute blood loss anemia   °• Sinus tachycardia   °• Postoperative pain   °• 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HCC) 02/03/2020  °• Protein-calorie malnutrition, severe 01/30/2020  °• Brain tumor (HCC) 01/27/2020  °• Low folate 01/23/2020  °• Anemia of chronic disease 01/23/2020  °• Small bowel obstruction from retained endoscopy capsule at stricture s/p ileal resection 01/19/2020 01/18/2020  °• 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 2020 09/07/2019  °• Hemoglobin low 09/07/2019  °• Primary localized osteoarthritis of left hip 08/17/2019  °• Death of family member-  husband passed Jul 2020 04/29/2019  °• Reactive depression 04/01/2019  °• Sleep difficulties 09/09/2018  °• Pre-diabetes 03/09/2018  °• Postmenopausal state- went thru early 40's 01/02/2018  °• Primary osteoarthritis of left hip 01/02/2018  °• Caregiver burden- for husband with ALLeukemia 01/02/2018  °• Left hip pain 01/02/2018  °• Osteopenia 07/31/2016  °• Hypertension 05/20/2011  °• Menopausal state 05/20/2011  ° ° ° B , PT, DPT °04/17/2020, 9:58 AM ° °[facility] °[facility] °[address] °[place], [place], [address] °Phone: [phone]   Fax:  [phone] ° °Name: Cristina Conner °MRN: [id number] °Date of Birth: 01/13/1961 ° ° °

## 2020-04-17 NOTE — Therapy (Signed)
[facility] [address] [place], [place], [address] Phone: [phone]   Fax:  [phone]  Occupational Therapy Treatment  Patient Details  Name: Cristina Conner MRN: [id number] Date of Birth: 1961/06/08 Referring Provider (OT): Dr. Alger Simons   Encounter Date: 04/17/2020   OT End of Session - 04/17/20 0903    Visit Number 11    Number of Visits 25    Date for OT Re-Evaluation 05/15/20    Authorization Type BCBS covered 100%, no visit limit    OT Start Time 0848    OT Stop Time 0930    OT Time Calculation (min) 42 min    Activity Tolerance Patient tolerated treatment well    Behavior During Therapy [facility] for tasks assessed/performed           Past Medical History:  Diagnosis Date   Anxiety    Arthritis    Diverticulosis    GERD (gastroesophageal reflux disease)    Hypertension    Osteopenia    Post-operative nausea and vomiting    vomiting after general anesthesia per pt.   Pre-diabetes     Past Surgical History:  Procedure Laterality Date   CESAREAN SECTION     x 2   COLONOSCOPY     CRANIOTOMY Left 01/27/2020   Procedure: Left Craniotomy for Tumor Resection;  Surgeon: Judith Part, MD;  Location: [facility];  Service: Neurosurgery;  Laterality: Left;   ENDOMETRIAL ABLATION     ESOPHAGOGASTRODUODENOSCOPY  10/2019   LAPAROSCOPY N/A 01/19/2020   Procedure: LAPAROSCOPY DIAGNOSTIC LAPAROTOMY WITH SMALL BOWEL RESECTION;  Surgeon: Ileana Roup, MD;  [facility];  Service: General;  Laterality: N/A;   PELVIC LAPAROSCOPY  1999   left salpingectomy, excision of Right, paratubal cyst   PELVIC LAPAROSCOPY     laparoscopy with lysis of pelvic adhesions   SHOULDER SURGERY  11/2010   "FROZEN SHOULDER"   TOTAL HIP ARTHROPLASTY Left 08/17/2019   Procedure: LEFT TOTAL HIP ARTHROPLASTY ANTERIOR APPROACH;  Surgeon: Melrose Nakayama, MD;  [facility];  Service: Orthopedics;   Laterality: Left;   TUBAL LIGATION     UPPER GASTROINTESTINAL ENDOSCOPY     VENTRICULOSTOMY Left 01/27/2020   Procedure: VENTRICULOSTOMY;  Surgeon: Judith Part, MD;  Location: [facility];  Service: Neurosurgery;  Laterality: Left;    There were no vitals filed for this visit.   Subjective Assessment - 04/17/20 0850    Subjective  Pt reports that she needs to cancel therapy appts Wednesday as she is seeing a specialist for her eye (cornea specialist).  Has been handling husband's estate and set up virtual visit/begin to care for mother.    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 decr hearing L side    Limitations fall risk, L eye patched due to inability to close eye    Patient Stated Goals wants to be able to drive, improve balance, perform financial management, be able to take care of mother    Currently in Pain? No/denies             Functional reaching to place small pegs in vertical pegboard for incr LUE  coordination and visual scanning.  Pt copied with 100% accuracy and min difficulty/drops with coordination.  Removing pegs with in-hand manipulation with min difficulty/drops.  Financial management task:  Calculating ending balance (using phone as calculator) when given beginning balance and 2-3 deposits/checks.  Pt with 100% accuracy with incr time (self-corrected errors without cueing)  Arm bike x59mn level 1 for conditioning without rest with min cueing to maintain >40rpms while performing category generation with min difficulty with maintaining speed while thinking.     Placing O'connor pegs in pegboard with good accuracy for incr coordination.  Began discussing progress/POC.      OT Short Term Goals - 04/12/20 1204      OT SHORT TERM GOAL #1   Title Pt will be independent with initial HEP for LUE coordination and strength.--check STGs 03/31/20    Time 6    Period Weeks    Status Achieved    Target Date 03/31/20      OT SHORT TERM GOAL #2   Title Pt will improve L hand coordination for ADLs as shown by improving time on 9-hole peg test by at least 12sec.    Baseline 40.40sec    Time 6    Period Weeks    Status On-going   31.03 sec     OT SHORT TERM GOAL #3   Title Pt will be able to perform tabletop scanning with at least 95% accuracy for incr attention and IADL tasks.    Time 6    Period Weeks    Status Achieved      OT SHORT TERM GOAL #4   Title Pt will perform simple home maintenance tasks mod I.    Time 6    Period Weeks    Status Achieved   03/07/20:  sweeping, laundry, dishes, wiping counters     OT SHORT TERM GOAL #5   Title Pt will perform simple financial management tasks with min A.    Time 6    Period Weeks    Status Achieved   Pt required min assist to correct 1 mistake (subtracting deposit vs. adding)            OT Long Term Goals - 04/17/20 0851      OT LONG TERM GOAL #1   Title Pt will be independent with updated HEP.--check LTGs 05/15/20    Time 12    Period Weeks    Status New      OT LONG TERM GOAL #2   Title Pt will improve L grip strength by at least 8lbs to assist with home maintenance tasks/in prep for yardwork.    Baseline 45 LBS    Time 12    Period Weeks    Status Achieved   54.2 LBS     OT LONG TERM GOAL #3   Title Pt will demo improved balance and strength to be able to water plants.    Time 12    Period Weeks    Status Achieved      OT LONG TERM GOAL #4   Title Pt will perform environmental scanning with at least 90% accuracy for incr safety for community activities.    Time 12     Period Weeks    Status Achieved   93% with distractions     OT LONG TERM GOAL #5   Title Pt will perform previous financial management tasks with supervision.    Time 12    Period Weeks    Status  Achieved   met at home, pt w/ min errors in clinic w/ 5 transactions.  04/17/20:  pt has been doing all finances and handling husband's estate     OT LONG TERM GOAL #6   Title Pt will be able to alternate attention between at least 1 physical and 1 cognitive task with at least 90% accuracy for incr safety with IADLs.    Time 12    Period Weeks    Status On-going   Pt performing w/ mod drops Lt hand and decreased/modified gait speed w/ cognitive tasks                Plan - 04/17/20 0853    Clinical Impression Statement Pt is progressing towards goals with improving coordination, visual scanning, and cognition.    Occupational performance deficits (Please refer to evaluation for details): ADL's;IADL's;Work;Leisure;Other    Body Structure / Function / Physical Skills ADL;Hearing;Vision;IADL;Balance;Mobility;Strength;Coordination;FMC;UE functional use    Cognitive Skills Attention;Memory;Perception;Thought    Rehab Potential Good    OT Frequency 2x / week    OT Duration 12 weeks    OT Treatment/Interventions Self-care/ADL training;DME and/or AE instruction;Therapeutic activities;Aquatic Therapy;Therapeutic exercise;Cognitive remediation/compensation;Visual/perceptual remediation/compensation;Functional Mobility Training;Neuromuscular education;Patient/family education    Plan continue divided attention and address remaining goals, continue to discuss POC & potential d/c by 05/01/20 (therapist added 1x/wk for 2 weeks in September but unsure if needed)    Consulted and Agree with Plan of Care Patient           Patient will benefit from skilled therapeutic intervention in order to improve the following deficits and impairments:   Body Structure / Function / Physical Skills: ADL, Hearing,  Vision, IADL, Balance, Mobility, Strength, Coordination, FMC, UE functional use Cognitive Skills: Attention, Memory, Perception, Thought     Visit Diagnosis: Other lack of coordination  Unsteadiness on feet  Other abnormalities of gait and mobility  Muscle weakness (generalized)  Attention and concentration deficit  Frontal lobe and executive function deficit    Problem List Patient Active Problem List   Diagnosis Date Noted   Cranial nerve VII palsy    Hypoalbuminemia due to protein-calorie malnutrition ([facility])    Acute blood loss anemia    Sinus tachycardia    Postoperative pain    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HCC) 02/03/2020   Protein-calorie malnutrition, severe 01/30/2020   Brain tumor ([facility]) 01/27/2020   Low folate 01/23/2020   Anemia of chronic disease 01/23/2020   Small bowel obstruction from retained endoscopy capsule at stricture s/p ileal resection 01/19/2020 01/18/2020   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 2020 09/07/2019   Hemoglobin low 09/07/2019   Primary localized osteoarthritis of left hip 29-Aug-2019   Death of family member-  husband passed Jul 2020 04/29/2019   Reactive depression 04/01/2019   Sleep difficulties 09/09/2018   Pre-diabetes 03/09/2018   Postmenopausal state- went thru early 40's 01/02/2018   Primary osteoarthritis of left hip 01/02/2018   Caregiver burden- for husband with ALLeukemia 01/02/2018   Left hip pain 01/02/2018   Osteopenia 07/31/2016   Hypertension 05/20/2011   Menopausal state 05/20/2011    [facility] 04/17/2020, 9:33 AM  [facility] [address] [place], [place], [address] Phone: [phone]   Fax:  [phone]  Name: RAEANNA SOBERANES MRN: [id number] Date of Birth: 02/01/1961   Vianne Bulls, OTR/L [facility] [address] [place], [place]  [address] [phone]  phone [phone] 04/17/20 9:33 AM

## 2020-04-19 ENCOUNTER — Ambulatory Visit: Payer: BC Managed Care – PPO | Admitting: Occupational Therapy

## 2020-04-19 ENCOUNTER — Ambulatory Visit: Payer: BC Managed Care – PPO

## 2020-04-19 ENCOUNTER — Encounter: Payer: BC Managed Care – PPO | Admitting: Speech Pathology

## 2020-04-20 ENCOUNTER — Ambulatory Visit: Payer: BC Managed Care – PPO

## 2020-04-20 ENCOUNTER — Other Ambulatory Visit: Payer: Self-pay

## 2020-04-20 ENCOUNTER — Encounter: Payer: Self-pay | Admitting: Occupational Therapy

## 2020-04-20 ENCOUNTER — Ambulatory Visit: Payer: BC Managed Care – PPO | Admitting: Occupational Therapy

## 2020-04-20 DIAGNOSIS — R2681 Unsteadiness on feet: Secondary | ICD-10-CM | POA: Diagnosis not present

## 2020-04-20 DIAGNOSIS — M6281 Muscle weakness (generalized): Secondary | ICD-10-CM

## 2020-04-20 DIAGNOSIS — R2689 Other abnormalities of gait and mobility: Secondary | ICD-10-CM

## 2020-04-20 DIAGNOSIS — R41844 Frontal lobe and executive function deficit: Secondary | ICD-10-CM

## 2020-04-20 DIAGNOSIS — R41840 Attention and concentration deficit: Secondary | ICD-10-CM

## 2020-04-20 DIAGNOSIS — R278 Other lack of coordination: Secondary | ICD-10-CM

## 2020-04-20 DIAGNOSIS — R42 Dizziness and giddiness: Secondary | ICD-10-CM

## 2020-04-20 NOTE — Therapy (Signed)
[facility] [address] [place], [place], [address] Phone: [phone]   Fax:  [phone]  Occupational Therapy Treatment  Patient Details  Name: Cristina Conner MRN: [id number] Date of Birth: 09/02/1961 Referring Provider (OT): Dr. Alger Simons   Encounter Date: 04/20/2020   OT End of Session - 04/20/20 1243    Visit Number 12    Number of Visits 25    Date for OT Re-Evaluation 05/15/20    Authorization Type BC[employer] covered 100%, no visit limit    OT Start Time 1235    OT Stop Time 1315    OT Time Calculation (min) 40 min    Activity Tolerance Patient tolerated treatment well    Behavior During Therapy [facility] for tasks assessed/performed           Past Medical History:  Diagnosis Date  . Anxiety   . Arthritis   . Diverticulosis   . GERD (gastroesophageal reflux disease)   . Hypertension   . Osteopenia   . Post-operative nausea and vomiting    vomiting after general anesthesia per pt.  . Pre-diabetes     Past Surgical History:  Procedure Laterality Date  . CESAREAN SECTION     x 2  . COLONOSCOPY    . CRANIOTOMY Left 01/27/2020   Procedure: Left Craniotomy for Tumor Resection;  Surgeon: Judith Part, MD;  Location: [place];  Service: Neurosurgery;  Laterality: Left;  . ENDOMETRIAL ABLATION    . ESOPHAGOGASTRODUODENOSCOPY  10/2019  . LAPAROSCOPY N/A 01/19/2020   Procedure: LAPAROSCOPY DIAGNOSTIC LAPAROTOMY WITH SMALL BOWEL RESECTION;  Surgeon: Ileana Roup, MD;  [facility];  Service: General;  Laterality: N/A;  . PELVIC LAPAROSCOPY  1999   left salpingectomy, excision of Right, paratubal cyst  . PELVIC LAPAROSCOPY     laparoscopy with lysis of pelvic adhesions  . SHOULDER SURGERY  11/2010   "FROZEN SHOULDER"  . TOTAL HIP ARTHROPLASTY Left 08/17/2019   Procedure: LEFT TOTAL HIP ARTHROPLASTY ANTERIOR APPROACH;  Surgeon: Melrose Nakayama, MD;  Location[facility];  Service: Orthopedics;   Laterality: Left;  . TUBAL LIGATION    . UPPER GASTROINTESTINAL ENDOSCOPY    . VENTRICULOSTOMY Left 01/27/2020   Procedure: VENTRICULOSTOMY;  Surgeon: Judith Part, MD;  Location: [place];  Service: Neurosurgery;  Laterality: Left;    There were no vitals filed for this visit.   Subjective Assessment - 04/20/20 1241    Subjective  Pt reports that she will have eye partially stictched closed. Consultation 05/03/20 in [place].    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 decr hearing L side    Limitations fall risk, L eye patched due to inability to close eye    Patient Stated Goals wants to be able to drive, improve balance, perform financial management, be able to take care of mother    Currently in Pain? No/denies             Sitting, functional reaching to place/remove small pegs in vertical pegboard to copy design for incr coordination.  Copied design with 100% accuracy and removed pegs using in-hand manipulation with min drops, (pt reports  difficulty with depth perception due to L eye occluded).  Copying PVC design with good accuracy, only 1 min v.c. initially.  For visual scanning/problem solving.    Environmental scanning in very busy, dynamic environment with 13/15 items found upon first attempt and remaining 2 found quickly on 2nd pass without cueing.  Completing Purdue Pegboard with mod difficulty manipulating all 3 pieces in hand to place 1 at a time.             OT Short Term Goals - 04/12/20 1204      OT SHORT TERM GOAL #1   Title Pt will be independent  with initial HEP for LUE coordination and strength.--check STGs 03/31/20    Time 6    Period Weeks    Status Achieved    Target Date 03/31/20      OT SHORT TERM GOAL #2   Title Pt will improve L hand coordination for ADLs as shown by improving time on 9-hole peg test by at least 12sec.    Baseline 40.40sec    Time 6    Period Weeks    Status On-going   31.03 sec     OT SHORT TERM GOAL #3   Title Pt will be able to perform tabletop scanning with at least 95% accuracy for incr attention and IADL tasks.    Time 6    Period Weeks    Status Achieved      OT SHORT TERM GOAL #4   Title Pt will perform simple home maintenance tasks mod I.    Time 6    Period Weeks    Status Achieved   03/07/20:  sweeping, laundry, dishes, wiping counters     OT SHORT TERM GOAL #5   Title Pt will perform simple financial management tasks with min A.    Time 6    Period Weeks    Status Achieved   Pt required min assist to correct 1 mistake (subtracting deposit vs. adding)            OT Long Term Goals - 04/17/20 0851      OT LONG TERM GOAL #1   Title Pt will be independent with updated HEP.--check LTGs 05/15/20    Time 12    Period Weeks    Status New      OT LONG TERM GOAL #2   Title Pt will improve L grip strength by at least 8lbs to assist with home maintenance tasks/in prep for yardwork.    Baseline 45 LBS    Time 12    Period Weeks    Status Achieved   54.2 LBS     OT LONG TERM GOAL #3   Title Pt will demo improved balance and strength to be able to water plants.    Time 12    Period Weeks    Status Achieved      OT LONG TERM GOAL #4   Title Pt will perform environmental scanning with at least 90% accuracy for incr safety for community activities.    Time 12    Period Weeks    Status Achieved   93% with distractions     OT LONG TERM GOAL #5   Title Pt will perform previous financial management tasks with supervision.    Time 12    Period Weeks    Status Achieved   met at  home, pt w/ min errors in clinic w/ 5 transactions.  04/17/20:  pt has been doing  all finances and handling husband's estate     OT LONG TERM GOAL #6   Title Pt will be able to alternate attention between at least 1 physical and 1 cognitive task with at least 90% accuracy for incr safety with IADLs.    Time 12    Period Weeks    Status On-going   Pt performing w/ mod drops Lt hand and decreased/modified gait speed w/ cognitive tasks                Plan - 04/20/20 1244    Clinical Impression Statement Pt is progressing towards goals with improving coordination, visual scanning, and cognition.    Occupational performance deficits (Please refer to evaluation for details): ADL's;IADL's;Work;Leisure;Other    Body Structure / Function / Physical Skills ADL;Hearing;Vision;IADL;Balance;Mobility;Strength;Coordination;FMC;UE functional use    Cognitive Skills Attention;Memory;Perception;Thought    Rehab Potential Good    OT Frequency 2x / week    OT Duration 12 weeks    OT Treatment/Interventions Self-care/ADL training;DME and/or AE instruction;Therapeutic activities;Aquatic Therapy;Therapeutic exercise;Cognitive remediation/compensation;Visual/perceptual remediation/compensation;Functional Mobility Training;Neuromuscular education;Patient/family education    Plan continue divided attention and address/begin checking remaining goals, continue to discuss POC & potential d/c by 05/01/20    Consulted and Agree with Plan of Care Patient           Patient will benefit from skilled therapeutic intervention in order to improve the following deficits and impairments:   Body Structure / Function / Physical Skills: ADL, Hearing, Vision, IADL, Balance, Mobility, Strength, Coordination, FMC, UE functional use Cognitive Skills: Attention, Memory, Perception, Thought     Visit Diagnosis: Other lack of coordination  Unsteadiness on feet  Other abnormalities of gait and mobility  Attention and  concentration deficit  Frontal lobe and executive function deficit  Muscle weakness (generalized)    Problem List Patient Active Problem List   Diagnosis Date Noted  . Cranial nerve VII palsy   . Hypoalbuminemia due to protein-calorie malnutrition ([place])   . Acute blood loss anemia   . Sinus tachycardia   . Postoperative pain   . 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[place]) 02/03/2020  . Protein-calorie malnutrition, severe 01/30/2020  . Brain tumor ([place]) 01/27/2020  . Low folate 01/23/2020  . Anemia of chronic disease 01/23/2020  . Small bowel obstruction from retained endoscopy capsule at stricture s/p ileal resection 01/19/2020 01/18/2020  . 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 Primary localized osteoarthritis of left hip 09/02/19  . Death of family member-  husband passed Jul 2020 04/29/2019  . 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 Sleep difficulties 09/09/2018  . Pre-diabetes 03/09/2018  . Postmenopausal state- went thru early 40's 01/02/2018  . Primary osteoarthritis of left hip 01/02/2018  . Caregiver burden- for husband with ALLeukemia 01/02/2018  . Left hip pain 01/02/2018  . Osteopenia 07/31/2016  . Hypertension 05/20/2011  . Menopausal state 05/20/2011    [facility] 04/20/2020, 1:11 PM  [facility] [address] [place], [place], [address] Phone: [phone]   Fax:  [phone]  Name: Cristina Conner MRN: [id number] Date of Birth: [birth year]   Vianne Bulls, OTR/L [facility] [address] [place], [place]  [address] [phone] phone [phone] 04/20/20 1:11 PM

## 2020-04-20 NOTE — Therapy (Signed)
[facility] [address] [place], [place], [address] Phone: [phone]   Fax:  [phone]  Physical Therapy Treatment  Patient Details  Name: Cristina Conner MRN: [id number] Date of Birth: 11-12-1960 Referring Provider (PT): Dr. Alger Simons   Encounter Date: 04/20/2020   PT End of Session - 04/20/20 1324    Visit Number 18    Number of Visits 22    Date for PT Re-Evaluation 06/11/20   written for 4 week POC   Authorization Type BCBS - [place]    PT Start Time 4097    PT Stop Time 1400    PT Time Calculation (min) 43 min    Equipment Utilized During Treatment Gait belt    Activity Tolerance Patient tolerated treatment well    Behavior During Therapy [facility] for tasks assessed/performed           Past Medical History:  Diagnosis Date  . Anxiety   . Arthritis   . Diverticulosis   . GERD (gastroesophageal reflux disease)   . Hypertension   . Osteopenia   . Post-operative nausea and vomiting    vomiting after general anesthesia per pt.  . Pre-diabetes     Past Surgical History:  Procedure Laterality Date  . CESAREAN SECTION     x 2  . COLONOSCOPY    . CRANIOTOMY Left 01/27/2020   Procedure: Left Craniotomy for Tumor Resection;  Surgeon: Judith Part, MD;  Location: [facility];  Service: Neurosurgery;  Laterality: Left;  . ENDOMETRIAL ABLATION    . ESOPHAGOGASTRODUODENOSCOPY  10/2019  . LAPAROSCOPY N/A 01/19/2020   Procedure: LAPAROSCOPY DIAGNOSTIC LAPAROTOMY WITH SMALL BOWEL RESECTION;  Surgeon: Ileana Roup, MD;  [facility];  Service: General;  Laterality: N/A;  . PELVIC LAPAROSCOPY  1999   left salpingectomy, excision of Right, paratubal cyst  . PELVIC LAPAROSCOPY     laparoscopy with lysis of pelvic adhesions  . SHOULDER SURGERY  11/2010   "FROZEN SHOULDER"  . TOTAL HIP ARTHROPLASTY Left 08/17/2019   Procedure: LEFT TOTAL HIP ARTHROPLASTY ANTERIOR APPROACH;  Surgeon: Melrose Nakayama, MD;   [facility];  Service: Orthopedics;  Laterality: Left;  . TUBAL LIGATION    . UPPER GASTROINTESTINAL ENDOSCOPY    . VENTRICULOSTOMY Left 01/27/2020   Procedure: VENTRICULOSTOMY;  Surgeon: Judith Part, MD;  Location: [facility];  Service: Neurosurgery;  Laterality: Left;    There were no vitals filed for this visit.   Subjective Assessment - 04/20/20 1432    Subjective No new changes/complaints. Has another consult schedule for eye in [place] on 05/03/20.    Pertinent History tachycardia, HTN after lapaaroscopic sx on 01-18-20, Lt THR on 08-17-19, anxiety    Diagnostic tests CT revealed hydrocephalus with tumor on Lt side 4th ventricle ;  MRI revealed Lt cerebellar pontine mass compatible with vestibular schwannoma    Patient Stated Goals get back to doing [employer] - would like to get back to work at [employer]    Currently in Pain? No/denies    Pain Onset In the past 7 days                              Vestibular Treatment/Exercise - 04/20/20 0001      Vestibular Treatment/Exercise   Gaze Exercises X1 Viewing Horizontal      X1 Viewing Horizontal   Foot Position standing on airex    Reps 3  Comments completed VOR x 1 with cognitive task (number chart), patient have difficulty maintain gaze on target with increasing difficulty of cogntiive task. PT continue to provide verbal cues for small range and increased freq of head turns.               Balance Exercises - 04/20/20 0001      Balance Exercises: Standing   Standing Eyes Opened Narrow base of support (BOS);Head turns;Foam/compliant surface;Limitations    Standing Eyes Opened Limitations on thick blue foam: completed static standing with narrow BOS and eyes open working on maintaining balance. With balance achieved completed horizontal/vertical head turns 1 x 10 reps each.    Tandem Stance Eyes open;Foam/compliant surface;3 reps;30 secs;Limitations    Tandem Stance Time completed 3 x 30 seconds without  UE support, on airex    SLS Eyes open;Foam/compliant surface;Intermittent upper extremity support;3 reps;Limitations    SLS Time 15-25    SLS Limitations completed on airex pad, completed SLS alternating between lower extremity. Patient reqiure increased UE support with stance on LLE    Balance Beam Static standing across balance beam with shoulder width stance with eyes open, 2 x 1 minute each. Progressed to completing with eyes closed 2 x 30 seconds. Progressed foot position to narrow BOS with eyes open 2 x 1 minute each, and progressed to include eyes closed 2 x 20-25 seconds. Standing across blue balance beam completed alternating to colored pebbles x 15 reps. Then completed crossover toe taps to colored pebbles x 15 reps. Increased difficulty with crossover taps today.                PT Short Term Goals - 03/29/20 1029      PT SHORT TERM GOAL #1   Title Improve Berg balance score by at least 4 points for reduced fall risk.    Baseline BERG assessed on 02/17/20, scored 50/56; 53/56 on 03/15/20    Time 4    Period Weeks    Status On-going    Target Date 03/17/20      PT SHORT TERM GOAL #2   Title Pt will improve TUG score from 14.57 secs to </= 12 secs without use of SPC for reduced fall risk.    Baseline 14.57 secs - pt carried cane, 9.62 w/o AD    Time 4    Period Weeks    Status Achieved    Target Date 03/17/20      PT SHORT TERM GOAL #3   Title Pt will ambulate 500' without use of SPC on flat even surfaces with SBA for incr. community accessibility.    Baseline 59o' w/ no AD, Mod I    Time 4    Period Weeks    Status Achieved    Target Date 03/17/20      PT SHORT TERM GOAL #4   Title Pt will amb. 65' with horizontal head turns without device with supervision for safety with environmental scanning.    Baseline 115' w/ horizontal head turns, no AD, supervision level    Time 4    Period Weeks    Status Achieved    Target Date 03/17/20      PT SHORT TERM GOAL #5    Title Pt will participate in aquatic therapy after permission obtained from MD (craniotomy incision healed).    Baseline not initiated aquatic therapy at this time    Time 4    Period Weeks    Status On-going    Target  Date 03/17/20      PT SHORT TERM GOAL #6   Title Independent in balance HEP.    Baseline Reports independence with exercises, only completing 4/7 days of week    Time 4    Period Weeks    Status Partially Met    Target Date 03/17/20      PT SHORT TERM GOAL #7   Title Perform SOT & establish goal as appropriate.    Baseline performed on 03/22/20 - LTG written as appropriate.    Time 4    Period Weeks    Status Achieved    Target Date 04/21/20             PT Long Term Goals - 04/12/20 1146      PT LONG TERM GOAL #1   Title Pt will improve FGA score to at least a 27/30 in order to demo decr fall risk.ALL LTGS DUE 04/14/20    Baseline 04/10/20: pt improved to 25/30 (was 17/30)    Time 4    Period Weeks    Status Revised    Target Date 05/10/20      PT LONG TERM GOAL #2   Title Pt will be independent in updated HEP at time of D/C from PT.    Time 4    Period Weeks    Status On-going      PT LONG TERM GOAL #3   Title Pt will improve composite score of SOT to at least 70 in order to demo improved vestibular input for balance.    Baseline composite score: 50, 58 on 04/12/20    Time 4    Period Weeks    Status New                 Plan - 04/20/20 1440    Clinical Impression Statement Today's skilled session included continued progression of balance activities including progression to thicker complaint surface with patient tolerating well. Patient still continue to demo SLS on LLE, and increased difficulty with vision removed at this time. Will continue to progress toward goals.    Personal Factors and Comorbidities Comorbidity 2;Transportation;Behavior Pattern;Comorbidity 3+    Comorbidities cranial nerve VII palsy, craniotomy on 01-27-20 for tumor  resection, unilateral vestibular schwannoma, anxiety,, s/p Lt THR Dec. 2020    Examination-Activity Limitations Transfers;Locomotion Level;Squat;Stairs;Stand;Carry    Examination-Participation Restrictions Meal Prep;Cleaning;Community Activity;Driving;Shop;Laundry;Interpersonal Relationship    Stability/Clinical Decision Making Evolving/Moderate complexity    Rehab Potential Good    PT Frequency 2x / week   plus 1x week for 2 weeks   PT Duration 2 weeks   plus 1x week for 2 weeks   PT Treatment/Interventions Aquatic Therapy;Gait training;Stair training;Therapeutic activities;Therapeutic exercise;Balance training;Neuromuscular re-education;Patient/family education;Vestibular    PT Next Visit Plan update HEP as necessary for improved vestibular input for balance. continue gait/high level balance with focus on vestibular system. Stroop Task with activites. [employer].    Consulted and Agree with Plan of Care Patient;Family member/caregiver    Family Member Consulted [name]           Patient will benefit from skilled therapeutic intervention in order to improve the following deficits and impairments:  Abnormal gait, Decreased balance, Decreased activity tolerance, Decreased coordination, Dizziness, Impaired vision/preception  Visit Diagnosis: Unsteadiness on feet  Other abnormalities of gait and mobility  Muscle weakness (generalized)  Dizziness and giddiness     Problem List Patient Active Problem List   Diagnosis Date Noted  . Cranial nerve VII palsy   .  Hypoalbuminemia due to protein-calorie malnutrition ([place])   . Acute blood loss anemia   . Sinus tachycardia   . Postoperative pain   . 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[place]) 02/03/2020  . Protein-calorie malnutrition, severe 01/30/2020  . Brain tumor ([place]) 01/27/2020  . Low folate 01/23/2020  . Anemia of chronic disease 01/23/2020  . Small bowel obstruction from retained endoscopy capsule at stricture s/p ileal  resection 01/19/2020 01/18/2020  . 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 Primary localized osteoarthritis of left hip 08/20/19  . Death of family member-  husband passed Jul 2020 04/29/2019  . 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 Sleep difficulties 09/09/2018  . Pre-diabetes 03/09/2018  . Postmenopausal state- went thru early 40's 01/02/2018  . Primary osteoarthritis of left hip 01/02/2018  . Caregiver burden- for husband with ALLeukemia 01/02/2018  . Left hip pain 01/02/2018  . Osteopenia 07/31/2016  . Hypertension 05/20/2011  . Menopausal state 05/20/2011    Jones Bales, PT, DPT 04/20/2020, 2:42 PM  [facility] [address] [place], [place], [address] Phone: [phone]   Fax:  [phone]  Name: Cristina Conner MRN: [id number] Date of Birth: 11-17-1960

## 2020-04-24 ENCOUNTER — Ambulatory Visit: Payer: BC Managed Care – PPO | Admitting: Occupational Therapy

## 2020-04-24 ENCOUNTER — Ambulatory Visit: Payer: BC Managed Care – PPO | Admitting: Physical Therapy

## 2020-04-24 ENCOUNTER — Encounter: Payer: BC Managed Care – PPO | Admitting: Speech Pathology

## 2020-04-24 ENCOUNTER — Other Ambulatory Visit: Payer: Self-pay

## 2020-04-24 DIAGNOSIS — R278 Other lack of coordination: Secondary | ICD-10-CM

## 2020-04-24 DIAGNOSIS — R2681 Unsteadiness on feet: Secondary | ICD-10-CM

## 2020-04-24 DIAGNOSIS — R42 Dizziness and giddiness: Secondary | ICD-10-CM

## 2020-04-24 DIAGNOSIS — R2689 Other abnormalities of gait and mobility: Secondary | ICD-10-CM

## 2020-04-24 DIAGNOSIS — R41844 Frontal lobe and executive function deficit: Secondary | ICD-10-CM

## 2020-04-24 DIAGNOSIS — R41840 Attention and concentration deficit: Secondary | ICD-10-CM

## 2020-04-24 NOTE — Therapy (Signed)
[facility] [address] [place], [place], [address] Phone: [phone]   Fax:  [phone]  Occupational Therapy Treatment  Patient Details  Name: Cristina Conner MRN: [id number] Date of Birth: [birth year] Referring Provider (OT): Dr. Alger Simons   Encounter Date: 04/24/2020   OT End of Session - 04/24/20 1223    Visit Number 13    Number of Visits 25    Date for OT Re-Evaluation 05/15/20    Authorization Type [employer] covered 100%, no visit limit    OT Start Time 0930    OT Stop Time 1013    OT Time Calculation (min) 43 min    Activity Tolerance Patient tolerated treatment well    Behavior During Therapy [facility] for tasks assessed/performed           Past Medical History:  Diagnosis Date  . Anxiety   . Arthritis   . Diverticulosis   . GERD (gastroesophageal reflux disease)   . Hypertension   . Osteopenia   . Post-operative nausea and vomiting    vomiting after general anesthesia per pt.  . Pre-diabetes     Past Surgical History:  Procedure Laterality Date  . CESAREAN SECTION     x 2  . COLONOSCOPY    . CRANIOTOMY Left 01/27/2020   Procedure: Left Craniotomy for Tumor Resection;  Surgeon: Judith Part, MD;  Location: [facility];  Service: Neurosurgery;  Laterality: Left;  . ENDOMETRIAL ABLATION    . ESOPHAGOGASTRODUODENOSCOPY  10/2019  . LAPAROSCOPY N/A 01/19/2020   Procedure: LAPAROSCOPY DIAGNOSTIC LAPAROTOMY WITH SMALL BOWEL RESECTION;  Surgeon: Ileana Roup, MD;  Location[facility];  Service: General;  Laterality: N/A;  . PELVIC LAPAROSCOPY  1999   left salpingectomy, excision of Right, paratubal cyst  . PELVIC LAPAROSCOPY     laparoscopy with lysis of pelvic adhesions  . SHOULDER SURGERY  11/2010   "FROZEN SHOULDER"  . TOTAL HIP ARTHROPLASTY Left 08/17/2019   Procedure: LEFT TOTAL HIP ARTHROPLASTY ANTERIOR APPROACH;  Surgeon: Melrose Nakayama, MD;  [facility];  Service: Orthopedics;   Laterality: Left;  . TUBAL LIGATION    . UPPER GASTROINTESTINAL ENDOSCOPY    . VENTRICULOSTOMY Left 01/27/2020   Procedure: VENTRICULOSTOMY;  Surgeon: Judith Part, MD;  Location: [facility];  Service: Neurosurgery;  Laterality: Left;    There were no vitals filed for this visit.   Subjective Assessment - 04/24/20 0934    Subjective  I changed the sheets on my king bed by myself this weekend - first time I've done that. 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 decr hearing L side    Limitations fall risk, L eye patched due to inability to close eye    Patient Stated Goals wants to be able to drive, improve balance, perform financial management, be able to take care of mother    Currently in Pain? Yes    Pain Score 0-No pain   up to 4 1/0   Pain Location Head    Pain Orientation Left;Anterior    Pain Descriptors / Indicators Aching;Shooting  Pain Frequency Intermittent    Aggravating Factors  headache    Pain Relieving Factors tylenol           Discussed progress with IADLS - see subjective. Also discussed possible d/c next session as pt has met most goals at this time.   Worked on placing O'Conner pegs in pegboard Lt hand and then removing (up to 3 in hand) for in hand manipulation and fingertip to/from palm translation with mod difficulty and min drops.   Ambulating while tossing ball in Lt hand/Rt hand w/ min difficulty Lt hand and slowed gait speed w/ step together pattern. Added cognitive component to increase  difficulty - pt still tossing/catching ball but gait speed slowed down further.                        OT Short Term Goals - 04/12/20 1204      OT SHORT TERM GOAL #1   Title Pt will be independent with initial HEP for LUE coordination and strength.--check STGs 03/31/20    Time 6    Period Weeks    Status Achieved    Target Date 03/31/20      OT SHORT TERM GOAL #2   Title Pt will improve L hand coordination for ADLs as shown by improving time on 9-hole peg test by at least 12sec.    Baseline 40.40sec    Time 6    Period Weeks    Status On-going   31.03 sec     OT SHORT TERM GOAL #3   Title Pt will be able to perform tabletop scanning with at least 95% accuracy for incr attention and IADL tasks.    Time 6    Period Weeks    Status Achieved      OT SHORT TERM GOAL #4   Title Pt will perform simple home maintenance tasks mod I.    Time 6    Period Weeks    Status Achieved   03/07/20:  sweeping, laundry, dishes, wiping counters     OT SHORT TERM GOAL #5   Title Pt will perform simple financial management tasks with min A.    Time 6    Period Weeks    Status Achieved   Pt required min assist to correct 1 mistake (subtracting deposit vs. adding)            OT Long Term Goals - 04/17/20 0851      OT LONG TERM GOAL #1   Title Pt will be independent with updated HEP.--check LTGs 05/15/20    Time 12    Period Weeks    Status New      OT LONG TERM GOAL #2   Title Pt will improve L grip strength by at least 8lbs to assist with home maintenance tasks/in prep for yardwork.    Baseline 45 LBS    Time 12    Period Weeks    Status Achieved   54.2 LBS     OT LONG TERM GOAL #3   Title Pt will demo improved balance and strength to be able to water plants.    Time 12    Period Weeks    Status Achieved      OT LONG TERM GOAL #4   Title Pt will perform environmental scanning with at least 90% accuracy for incr safety for community activities.    Time 12     Period Weeks    Status Achieved  93% with distractions     OT LONG TERM GOAL #5   Title Pt will perform previous financial management tasks with supervision.    Time 12    Period Weeks    Status Achieved   met at home, pt w/ min errors in clinic w/ 5 transactions.  04/17/20:  pt has been doing all finances and handling husband's estate     OT LONG TERM GOAL #6   Title Pt will be able to alternate attention between at least 1 physical and 1 cognitive task with at least 90% accuracy for incr safety with IADLs.    Time 12    Period Weeks    Status On-going   Pt performing w/ mod drops Lt hand and decreased/modified gait speed w/ cognitive tasks                Plan - 04/24/20 1224    Clinical Impression Statement Pt progressing with coordination and divided attention    Occupational performance deficits (Please refer to evaluation for details): ADL's;IADL's;Work;Leisure;Other    Body Structure / Function / Physical Skills ADL;Hearing;Vision;IADL;Balance;Mobility;Strength;Coordination;FMC;UE functional use    Cognitive Skills Attention;Memory;Perception;Thought    Rehab Potential Good    OT Frequency 2x / week    OT Duration 12 weeks    OT Treatment/Interventions Self-care/ADL training;DME and/or AE instruction;Therapeutic activities;Aquatic Therapy;Therapeutic exercise;Cognitive remediation/compensation;Visual/perceptual remediation/compensation;Functional Mobility Training;Neuromuscular education;Patient/family education    Plan assess remaining goals and possible d/c next session    Consulted and Agree with Plan of Care Patient           Patient will benefit from skilled therapeutic intervention in order to improve the following deficits and impairments:   Body Structure / Function / Physical Skills: ADL, Hearing, Vision, IADL, Balance, Mobility, Strength, Coordination, FMC, UE functional use Cognitive Skills: Attention, Memory, Perception, Thought     Visit  Diagnosis: Unsteadiness on feet  Other lack of coordination  Attention and concentration deficit  Frontal lobe and executive function deficit    Problem List Patient Active Problem List   Diagnosis Date Noted  . Cranial nerve VII palsy   . Hypoalbuminemia due to protein-calorie malnutrition ([place])   . Acute blood loss anemia   . Sinus tachycardia   . Postoperative pain   . 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[place]) 02/03/2020  . Protein-calorie malnutrition, severe 01/30/2020  . Brain tumor ([place]) 01/27/2020  . Low folate 01/23/2020  . Anemia of chronic disease 01/23/2020  . Small bowel obstruction from retained endoscopy capsule at stricture s/p ileal resection 01/19/2020 01/18/2020  . 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 Primary localized osteoarthritis of left hip 2019/08/27  . Death of family member-  husband passed Jul 2020 04/29/2019  . 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 Sleep difficulties 09/09/2018  . Pre-diabetes 03/09/2018  . Postmenopausal state- went thru early 40's 01/02/2018  . Primary osteoarthritis of left hip 01/02/2018  . Caregiver burden- for husband with ALLeukemia 01/02/2018  . Left hip pain 01/02/2018  . Osteopenia 07/31/2016  . Hypertension 05/20/2011  . Menopausal state 05/20/2011    Carey Bullocks, OTR/L 04/24/2020, 12:26 PM  [facility] [address] [place], [place], [address] Phone: [phone]   Fax:  [phone]  Name: Cristina Conner MRN: [id number] Date of Birth: 03-Sep-1960

## 2020-04-25 ENCOUNTER — Encounter: Payer: Self-pay | Admitting: Physical Therapy

## 2020-04-25 NOTE — Therapy (Signed)
[facility] [address] [place], [place], [address] Phone: [phone]   Fax:  [phone]  Physical Therapy Treatment  Patient Details  Name: Cristina Conner MRN: [id number] Date of Birth: May 06, 1961 Referring Provider (PT): Dr. Alger Simons   Encounter Date: 04/24/2020   PT End of Session - 04/25/20 1139    Visit Number 19    Number of Visits 22    Date for PT Re-Evaluation 06/11/20   written for 4 week POC   Authorization Type BCBS - [place]    PT Start Time 0801    PT Stop Time 0845    PT Time Calculation (min) 44 min    Equipment Utilized During Treatment Gait belt    Activity Tolerance Patient tolerated treatment well    Behavior During Therapy [facility] for tasks assessed/performed           Past Medical History:  Diagnosis Date  . Anxiety   . Arthritis   . Diverticulosis   . GERD (gastroesophageal reflux disease)   . Hypertension   . Osteopenia   . Post-operative nausea and vomiting    vomiting after general anesthesia per pt.  . Pre-diabetes     Past Surgical History:  Procedure Laterality Date  . CESAREAN SECTION     x 2  . COLONOSCOPY    . CRANIOTOMY Left 01/27/2020   Procedure: Left Craniotomy for Tumor Resection;  Surgeon: Judith Part, MD;  Location: [facility];  Service: Neurosurgery;  Laterality: Left;  . ENDOMETRIAL ABLATION    . ESOPHAGOGASTRODUODENOSCOPY  10/2019  . LAPAROSCOPY N/A 01/19/2020   Procedure: LAPAROSCOPY DIAGNOSTIC LAPAROTOMY WITH SMALL BOWEL RESECTION;  Surgeon: Ileana Roup, MD;  [facility];  Service: General;  Laterality: N/A;  . PELVIC LAPAROSCOPY  1999   left salpingectomy, excision of Right, paratubal cyst  . PELVIC LAPAROSCOPY     laparoscopy with lysis of pelvic adhesions  . SHOULDER SURGERY  11/2010   "FROZEN SHOULDER"  . TOTAL HIP ARTHROPLASTY Left 08/17/2019   Procedure: LEFT TOTAL HIP ARTHROPLASTY ANTERIOR APPROACH;  Surgeon: Melrose Nakayama, MD;   [facility];  Service: Orthopedics;  Laterality: Left;  . TUBAL LIGATION    . UPPER GASTROINTESTINAL ENDOSCOPY    . VENTRICULOSTOMY Left 01/27/2020   Procedure: VENTRICULOSTOMY;  Surgeon: Judith Part, MD;  Location: [facility];  Service: Neurosurgery;  Laterality: Left;    There were no vitals filed for this visit.   Subjective Assessment - 04/25/20 1128    Subjective No new changes/complaints. 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 Yes    Pain Score 3     Pain Location Head    Pain Orientation Left;Anterior    Pain Descriptors / Indicators Aching;Shooting    Pain Type Acute pain    Pain Onset 1 to 4 weeks ago    Pain Frequency Intermittent  [facility] Adult PT Treatment/Exercise - 04/25/20 0001      Ambulation/Gait   Ambulation/Gait Yes    Ambulation/Gait Assistance 5: Supervision    Ambulation Distance (Feet) 100 Feet    Assistive device None    Gait Pattern Step-through pattern    Ambulation Surface Level;Indoor    Gait Comments pt performed abrupt stop and turn 2 reps; abrupt turn and stop 2 reps       High Level Balance   High Level Balance Activities Backward walking;Direction changes;Turns;Sudden stops               Balance Exercises - 04/25/20 0001      Balance Exercises: Standing   Tandem Stance Eyes open;2 reps;30 secs   30 secs    Rockerboard Anterior/posterior;EO;EC;5 reps;Intermittent UE support    Partial Tandem Stance Eyes open;Eyes closed;Foam/compliant surface;5 reps      Turning Right;Left   4 reps to each side   Other Standing Exercises pt performed sit to stand with pivot turn to Rt and Lt sides 5 reps each with feet on floor; 5 reps to Rt and Lt with feet on blue mat with CGA    Other Standing Exercises Comments marching in place on blue mat with EO and then with EC with CGA t min assist for recovery of LOB; pt performed alternating tap downs to floor with CGA ;  pt performed simulated kitchen activtiy - placing cones from counter to stove 4 reps with increasing speed to fascilitate speed with turning for incr. challenge          Pt performed standing on blue mat - alternate tap downs to floor focusing straight ahead  5 reps each foot; added head turns Horizontally 5 reps with CGA to min assist  Marching on blue mat - EO then EC; EO with head turns horizontal 5 reps, vertical 5 reps; EC - no head movement; EC with horizontal head turns Then with vertical head turns 5 reps each      PT Short Term Goals - 04/25/20 1145      PT SHORT TERM GOAL #1   Title Improve Berg balance score by at least 4 points for reduced fall risk.    Baseline BERG assessed on 02/17/20, scored 50/56; 53/56 on 03/15/20    Time 4    Period Weeks    Status On-going    Target Date 03/17/20      PT SHORT TERM GOAL #2   Title Pt will improve TUG score from 14.57 secs to </= 12 secs without use of SPC for reduced fall risk.    Baseline 14.57 secs - pt carried cane, 9.62 w/o AD    Time 4    Period Weeks    Status Achieved    Target Date 03/17/20      PT SHORT TERM GOAL #3   Title Pt will ambulate 500' without use of SPC on flat even surfaces with SBA for incr. community accessibility.    Baseline 59o' w/ no AD, Mod I    Time 4    Period Weeks    Status Achieved    Target Date 03/17/20      PT SHORT TERM GOAL #4   Title Pt will amb. 72' with horizontal head turns without device with supervision for safety with environmental scanning.    Baseline 115' w/ horizontal head  turns, no AD, supervision level    Time 4    Period Weeks    Status Achieved  Target Date 03/17/20      PT SHORT TERM GOAL #5   Title Pt will participate in aquatic therapy after permission obtained from MD (craniotomy incision healed).    Baseline not initiated aquatic therapy at this time    Time 4    Period Weeks    Status On-going    Target Date 03/17/20      PT SHORT TERM GOAL #6   Title Independent in balance HEP.    Baseline Reports independence with exercises, only completing 4/7 days of week    Time 4    Period Weeks    Status Partially Met    Target Date 03/17/20      PT SHORT TERM GOAL #7   Title Perform SOT & establish goal as appropriate.    Baseline performed on 03/22/20 - LTG written as appropriate.    Time 4    Period Weeks    Status Achieved    Target Date 04/21/20             PT Long Term Goals - 04/25/20 1146      PT LONG TERM GOAL #1   Title Pt will improve FGA score to at least a 27/30 in order to demo decr fall risk.ALL LTGS DUE 04/14/20    Baseline 04/10/20: pt improved to 25/30 (was 17/30)    Time 4    Period Weeks    Status Revised      PT LONG TERM GOAL #2   Title Pt will be independent in updated HEP at time of D/C from PT.    Time 4    Period Weeks    Status On-going      PT LONG TERM GOAL #3   Title Pt will improve composite score of SOT to at least 70 in order to demo improved vestibular input for balance.    Baseline composite score: 50, 58 on 04/12/20    Time 4    Period Weeks    Status New                 Plan - 04/25/20 1139    Clinical Impression Statement Pt has most difficulty maintaining balance with EC with standing on compliant surfaces and also some difficulty noted with maintaining balance with head turns (vertical  more challenging than horizontal) with standing on compliant surfaces, indicative of decreased vestibualr input in maintaining balance.  Lack of vision in Lt eye (due to pt wearing patch) contributes  to balance deficits also.  Pt did well with performing quick turns in kitchen for simulated kithen activityas performed at home.   Pt is progressing towards goals.    Personal Factors and Comorbidities Comorbidity 2;Transportation;Behavior Pattern;Comorbidity 3+    Comorbidities cranial nerve VII palsy, craniotomy on 01-27-20 for tumor resection, unilateral vestibular schwannoma, anxiety,, s/p Lt THR Dec. 2020    Examination-Activity Limitations Transfers;Locomotion Level;Squat;Stairs;Stand;Carry    Examination-Participation Restrictions Meal Prep;Cleaning;Community Activity;Driving;Shop;Laundry;Interpersonal Relationship    Stability/Clinical Decision Making Evolving/Moderate complexity    Rehab Potential Good    PT Frequency 2x / week   plus 1x week for 2 weeks   PT Duration 2 weeks   plus 1x week for 2 weeks   PT Treatment/Interventions Aquatic Therapy;Gait training;Stair training;Therapeutic activities;Therapeutic exercise;Balance training;Neuromuscular re-education;Patient/family education;Vestibular    PT Next Visit Plan Try marching on ramp (place mat on surface as appropriate for compliant surface training) for balance and vestibular activities:  continue gait/high level balance with focus on vestibular system. Stroop  Task with activites. [employer].    Consulted and Agree with Plan of Care Patient;Family member/caregiver    Family Member Consulted [name]           Patient will benefit from skilled therapeutic intervention in order to improve the following deficits and impairments:  Abnormal gait, Decreased balance, Decreased activity tolerance, Decreased coordination, Dizziness, Impaired vision/preception  Visit Diagnosis: Unsteadiness on feet  Other abnormalities of gait and mobility  Dizziness and giddiness     Problem List Patient Active Problem List   Diagnosis Date Noted  . Cranial nerve VII palsy   . Hypoalbuminemia due to protein-calorie malnutrition ([place])   .  Acute blood loss anemia   . Sinus tachycardia   . Postoperative pain   . 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[place]) 02/03/2020  . Protein-calorie malnutrition, severe 01/30/2020  . Brain tumor ([place]) 01/27/2020  . Low folate 01/23/2020  . Anemia of chronic disease 01/23/2020  . Small bowel obstruction from retained endoscopy capsule at stricture s/p ileal resection 01/19/2020 01/18/2020  . 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 Primary localized osteoarthritis of left hip 09/01/2019  . Death of family member-  husband passed Jul 2020 04/29/2019  . 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 Sleep difficulties 09/09/2018  . Pre-diabetes 03/09/2018  . Postmenopausal state- went thru early 40's 01/02/2018  . Primary osteoarthritis of left hip 01/02/2018  . Caregiver burden- for husband with ALLeukemia 01/02/2018  . Left hip pain 01/02/2018  . Osteopenia 07/31/2016  . Hypertension 05/20/2011  . Menopausal state 05/20/2011    [place], PT 04/25/2020, 11:47 AM  [facility] [address] [place], [place], [address] Phone: [phone]   Fax:  [phone]  Name: Cristina Conner MRN: [id number] Date of Birth: 12-27-1960

## 2020-04-26 ENCOUNTER — Encounter: Payer: BC Managed Care – PPO | Admitting: Speech Pathology

## 2020-04-26 ENCOUNTER — Ambulatory Visit: Payer: BC Managed Care – PPO | Admitting: Occupational Therapy

## 2020-04-26 ENCOUNTER — Ambulatory Visit: Payer: BC Managed Care – PPO

## 2020-04-26 ENCOUNTER — Other Ambulatory Visit: Payer: Self-pay

## 2020-04-26 DIAGNOSIS — R278 Other lack of coordination: Secondary | ICD-10-CM

## 2020-04-26 DIAGNOSIS — R41840 Attention and concentration deficit: Secondary | ICD-10-CM

## 2020-04-26 DIAGNOSIS — R42 Dizziness and giddiness: Secondary | ICD-10-CM

## 2020-04-26 DIAGNOSIS — R2681 Unsteadiness on feet: Secondary | ICD-10-CM | POA: Diagnosis not present

## 2020-04-26 DIAGNOSIS — R2689 Other abnormalities of gait and mobility: Secondary | ICD-10-CM

## 2020-04-26 DIAGNOSIS — M6281 Muscle weakness (generalized): Secondary | ICD-10-CM

## 2020-04-26 NOTE — Therapy (Signed)
[facility] [address] [place], [place], [address] Phone: [phone]   Fax:  [phone]  Occupational Therapy Treatment  Patient Details  Name: Cristina Conner MRN: [id number] Date of Birth: [birth year] Referring Provider (OT): Dr. Alger Simons   Encounter Date: 04/26/2020   OT End of Session - 04/26/20 0819    Visit Number 14    Number of Visits 25    Date for OT Re-Evaluation 05/15/20    Authorization Type [employer] covered 100%, no visit limit    OT Start Time 0800    OT Stop Time 0845    OT Time Calculation (min) 45 min    Activity Tolerance Patient tolerated treatment well    Behavior During Therapy [facility] for tasks assessed/performed           Past Medical History:  Diagnosis Date   Anxiety    Arthritis    Diverticulosis    GERD (gastroesophageal reflux disease)    Hypertension    Osteopenia    Post-operative nausea and vomiting    vomiting after general anesthesia per pt.   Pre-diabetes     Past Surgical History:  Procedure Laterality Date   CESAREAN SECTION     x 2   COLONOSCOPY     CRANIOTOMY Left 01/27/2020   Procedure: Left Craniotomy for Tumor Resection;  Surgeon: Judith Part, MD;  Location: [facility];  Service: Neurosurgery;  Laterality: Left;   ENDOMETRIAL ABLATION     ESOPHAGOGASTRODUODENOSCOPY  10/2019   LAPAROSCOPY N/A 01/19/2020   Procedure: LAPAROSCOPY DIAGNOSTIC LAPAROTOMY WITH SMALL BOWEL RESECTION;  Surgeon: Ileana Roup, MD;  Location: [facility];  Service: General;  Laterality: N/A;   PELVIC LAPAROSCOPY  1999   left salpingectomy, excision of Right, paratubal cyst   PELVIC LAPAROSCOPY     laparoscopy with lysis of pelvic adhesions   SHOULDER SURGERY  11/2010   "FROZEN SHOULDER"   TOTAL HIP ARTHROPLASTY Left 08/17/2019   Procedure: LEFT TOTAL HIP ARTHROPLASTY ANTERIOR APPROACH;  Surgeon: Melrose Nakayama, MD;  Location: [facility];  Service: Orthopedics;   Laterality: Left;   TUBAL LIGATION     UPPER GASTROINTESTINAL ENDOSCOPY     VENTRICULOSTOMY Left 01/27/2020   Procedure: VENTRICULOSTOMY;  Surgeon: Judith Part, MD;  Location: [facility];  Service: Neurosurgery;  Laterality: Left;    There were no vitals filed for this visit.   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 decr hearing L side    Limitations fall risk, L eye patched due to inability to close eye    Patient Stated Goals wants to be able to drive, improve balance, perform financial management, be able to take care of mother    Currently in Pain? Yes    Pain Score 3     Pain Location Head    Pain Orientation Left;Anterior    Pain Descriptors / Indicators Headache    Pain Type Acute pain    Pain Frequency Intermittent    Aggravating Factors  headache    Pain Relieving Factors tylenol            9 hole peg test Lt = 25.25 sec. Reviewed/assessed remaining goals  for d/c.   Issued theraband HEP for LUE as pt felt fatigued easier than RUE. MMT LUE WFL's however endurance decreased. See pt instructions for details on HEP. Pt performed each x 10 reps.  UBE x 5 min, level 5 for UB conditioning/endurance  Divided attn b/t physical and cognitive task w/ more drops today than last session. Pt reports vision appears slightly worse today which she reports hindered her ability to catch ball. Pt however still able to divide attention w/ only 1 self corrected error in cognitive task, slower gait speed.  BP = 143/86, HR = 60, O2 sats = 100%                    OT Education -  04/26/20 0817    Education Details theraband HEP    Person(s) Educated Patient    Methods Explanation;Demonstration;Handout    Comprehension Verbalized understanding;Returned demonstration            OT Short Term Goals - 04/26/20 0819      OT SHORT TERM GOAL #1   Title Pt will be independent with initial HEP for LUE coordination and strength.--check STGs 03/31/20    Time 6    Period Weeks    Status Achieved    Target Date 03/31/20      OT SHORT TERM GOAL #2   Title Pt will improve L hand coordination for ADLs as shown by improving time on 9-hole peg test by at least 12sec.    Baseline 40.40sec    Time 6    Period Weeks    Status Achieved   25.25 sec     OT SHORT TERM GOAL #3   Title Pt will be able to perform tabletop scanning with at least 95% accuracy for incr attention and IADL tasks.    Time 6    Period Weeks    Status Achieved      OT SHORT TERM GOAL #4   Title Pt will perform simple home maintenance tasks mod I.    Time 6    Period Weeks    Status Achieved   03/07/20:  sweeping, laundry, dishes, wiping counters     OT SHORT TERM GOAL #5   Title Pt will perform simple financial management tasks with min A.    Time 6    Period Weeks    Status Achieved   Pt required min assist to correct 1 mistake (subtracting deposit vs. adding)            OT Long Term Goals - 04/26/20 0820      OT LONG TERM GOAL #1   Title Pt will be independent with updated HEP.--check LTGs 05/15/20    Time 12    Period Weeks    Status Achieved      OT LONG TERM GOAL #2   Title Pt will improve L grip strength by at least 8lbs to assist with home maintenance tasks/in prep for yardwork.    Baseline 45 LBS    Time 12    Period Weeks    Status Achieved   54.2 LBS     OT LONG TERM GOAL #3   Title Pt will demo improved balance and strength to be able to water plants.    Time 12    Period Weeks    Status Achieved      OT LONG TERM GOAL #4   Title Pt will perform environmental  scanning with at least 90% accuracy for  incr safety for community activities.    Time 12    Period Weeks    Status Achieved   93% with distractions     OT LONG TERM GOAL #5   Title Pt will perform previous financial management tasks with supervision.    Time 12    Period Weeks    Status Achieved   met at home, pt w/ min errors in clinic w/ 5 transactions.  04/17/20:  pt has been doing all finances and handling husband's estate     OT LONG TERM GOAL #6   Title Pt will be able to alternate attention between at least 1 physical and 1 cognitive task with at least 90% accuracy for incr safety with IADLs.    Time 12    Period Weeks    Status Partially Met   Pt performing w/ min drops to mod drops Lt hand and decreased/modified gait speed w/ cognitive tasks                Plan - 04/26/20 4239    Clinical Impression Statement Pt met all STG's and LTG's.    Occupational performance deficits (Please refer to evaluation for details): ADL's;IADL's;Work;Leisure;Other    Body Structure / Function / Physical Skills ADL;Hearing;Vision;IADL;Balance;Mobility;Strength;Coordination;FMC;UE functional use    Cognitive Skills Attention;Memory;Perception;Thought    Rehab Potential Good    OT Frequency 2x / week    OT Duration 12 weeks    OT Treatment/Interventions Self-care/ADL training;DME and/or AE instruction;Therapeutic activities;Aquatic Therapy;Therapeutic exercise;Cognitive remediation/compensation;Visual/perceptual remediation/compensation;Functional Mobility Training;Neuromuscular education;Patient/family education    Plan D/C O.T.    Consulted and Agree with Plan of Care Patient           Patient will benefit from skilled therapeutic intervention in order to improve the following deficits and impairments:   Body Structure / Function / Physical Skills: ADL, Hearing, Vision, IADL, Balance, Mobility, Strength, Coordination, FMC, UE functional use Cognitive Skills: Attention, Memory,  Perception, Thought     Visit Diagnosis: Other lack of coordination  Attention and concentration deficit  Muscle weakness (generalized)    Problem List Patient Active Problem List   Diagnosis Date Noted   Cranial nerve VII palsy    Hypoalbuminemia due to protein-calorie malnutrition (HCC)    Acute blood loss anemia    Sinus tachycardia    Postoperative pain    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HCC) 02/03/2020   Protein-calorie malnutrition, severe 01/30/2020   Brain tumor ([place]) 01/27/2020   Low folate 01/23/2020   Anemia of chronic disease 01/23/2020   Small bowel obstruction from retained endoscopy capsule at stricture s/p ileal resection 01/19/2020 01/18/2020   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 2020 09/07/2019   Hemoglobin low 09/07/2019   Primary localized osteoarthritis of left hip 2019-09-06   Death of family member-  husband passed Jul 2020 04/29/2019   Reactive depression 04/01/2019   Sleep difficulties 09/09/2018   Pre-diabetes 03/09/2018   Postmenopausal state- went thru early 40's 01/02/2018   Primary osteoarthritis of left hip 01/02/2018   Caregiver burden- for husband with ALLeukemia 01/02/2018   Left hip pain 01/02/2018   Osteopenia 07/31/2016   Hypertension 05/20/2011   Menopausal state 05/20/2011    OCCUPATIONAL THERAPY DISCHARGE SUMMARY  Visits from Start of Care: 14  Current functional level related to goals / functional outcomes: See above   Remaining deficits: Vision (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  Patient goals were  met. Patient is being discharged due to meeting the stated rehab goals.  ?????        Carey Bullocks, OTR/L 04/26/2020, 9:53 AM  [facility] [address] [place], [place], [address] Phone: [phone]   Fax:   [phone]  Name: ORIA KLIMAS MRN: [id number] Date of Birth: 16-Mar-1961

## 2020-04-26 NOTE — Therapy (Signed)
[facility] [address] [place], [place], [address] Phone: [phone]   Fax:  [phone]  Physical Therapy Treatment  Patient Details  Name: Cristina Conner MRN: [id number] Date of Birth: 02/18/1961 Referring Provider (PT): Dr. Alger Simons   Encounter Date: 04/26/2020   PT End of Session - 04/26/20 0934    Visit Number 20    Number of Visits 22    Date for PT Re-Evaluation 06/11/20   written for 4 week POC   Authorization Type BCBS - [place]    PT Start Time 0932    PT Stop Time 1014    PT Time Calculation (min) 42 min    Equipment Utilized During Treatment Gait belt    Activity Tolerance Patient tolerated treatment well    Behavior During Therapy [facility] for tasks assessed/performed           Past Medical History:  Diagnosis Date  . Anxiety   . Arthritis   . Diverticulosis   . GERD (gastroesophageal reflux disease)   . Hypertension   . Osteopenia   . Post-operative nausea and vomiting    vomiting after general anesthesia per pt.  . Pre-diabetes     Past Surgical History:  Procedure Laterality Date  . CESAREAN SECTION     x 2  . COLONOSCOPY    . CRANIOTOMY Left 01/27/2020   Procedure: Left Craniotomy for Tumor Resection;  Surgeon: Judith Part, MD;  Location: [facility];  Service: Neurosurgery;  Laterality: Left;  . ENDOMETRIAL ABLATION    . ESOPHAGOGASTRODUODENOSCOPY  10/2019  . LAPAROSCOPY N/A 01/19/2020   Procedure: LAPAROSCOPY DIAGNOSTIC LAPAROTOMY WITH SMALL BOWEL RESECTION;  Surgeon: Ileana Roup, MD;  [facility];  Service: General;  Laterality: N/A;  . PELVIC LAPAROSCOPY  1999   left salpingectomy, excision of Right, paratubal cyst  . PELVIC LAPAROSCOPY     laparoscopy with lysis of pelvic adhesions  . SHOULDER SURGERY  11/2010   "FROZEN SHOULDER"  . TOTAL HIP ARTHROPLASTY Left 08/17/2019   Procedure: LEFT TOTAL HIP ARTHROPLASTY ANTERIOR APPROACH;  Surgeon: Melrose Nakayama, MD;   [facility];  Service: Orthopedics;  Laterality: Left;  . TUBAL LIGATION    . UPPER GASTROINTESTINAL ENDOSCOPY    . VENTRICULOSTOMY Left 01/27/2020   Procedure: VENTRICULOSTOMY;  Surgeon: Judith Part, MD;  Location: [facility];  Service: Neurosurgery;  Laterality: Left;    There were no vitals filed for this visit.   Subjective Assessment - 04/26/20 0936    Subjective Patient reports no new changes/complaints. Patient reports feeling slightly off in terms of balance.    Pertinent History tachycardia, HTN after lapaaroscopic sx on 01-18-20, Lt THR on 08-17-19, anxiety    Diagnostic tests CT revealed hydrocephalus with tumor on Lt side 4th ventricle ;  MRI revealed Lt cerebellar pontine mass compatible with vestibular schwannoma    Patient Stated Goals get back to doing [employer] - would like to get back to work at [employer]    Currently in Pain? Yes    Pain Score 2     Pain Location Head    Pain Orientation Left;Anterior    Pain Descriptors / Indicators Headache    Pain Type Acute pain    Pain Onset 1 to 4 weeks ago                             [facility] Adult PT Treatment/Exercise - 04/26/20 0001  Ambulation/Gait   Ambulation/Gait Yes    Ambulation/Gait Assistance 5: Supervision    Ambulation/Gait Assistance Details throughout therapy gym with activities     Assistive device None    Gait Pattern Step-through pattern    Ambulation Surface Level;Indoor               Balance Exercises - 04/26/20 0001      Balance Exercises: Standing   Standing Eyes Opened Narrow base of support (BOS);Head turns;Limitations    Standing Eyes Opened Limitations completed standing with narrow BOS on BOSU with flat side up. completed with eyes open 3 x 30 seconds, progressed to horizontal head turns with intermittent UE support x 10 reps. Progressed by further narrowing BOS throughout completion as tolerated by patient.     SLS Eyes open;Intermittent upper extremity support;3  reps;10 secs;Limitations    SLS Time 5-10 seconds    SLS Limitations completed SLS on Bosu Ball, initially completing with BUE support and progressed to no UE support with patient tolerating well, able to hold approx 5-10 seconds prior to slight touch to // bars.     Tandem Gait Forward;3 reps;Limitations    Tandem Gait Limitations completed 3 x 30". progressed to completing tandem gait with horizontal head turns. Increased difficulty noted with horizontal head turns., requiring CGA from PT to maintain balance at times.     Other Standing Exercises Standing on incline on blue mat: completed alternating marching x 15 reps without UE support and eyes open, progressed to completing marching with eyes closed with increased unsteadiness, patient demo drift to L side with eyes closed. Progressed to completing feet together with eyes open and horizontal/vertical head turns 1 x 15 reps each. Completed tandem stance 3 x 1 minutes alteranting foot position, with addition of horizontal/vertical head turns 2 x 10 reps with each. Pt demo increased difficulty with horizontal head turns with tandem stance > vertical.     Other Standing Exercises Comments standing on incline with blue mat: completed alternating toe taps to pebbles 1 x 15 reps with patient maintain balance well. progressed to completing crossover toe taps x 15 reps alternating, increased balance challenge noted and more time reqiured for completion.                PT Short Term Goals - 04/25/20 1145      PT SHORT TERM GOAL #1   Title Improve Berg balance score by at least 4 points for reduced fall risk.    Baseline BERG assessed on 02/17/20, scored 50/56; 53/56 on 03/15/20    Time 4    Period Weeks    Status On-going    Target Date 03/17/20      PT SHORT TERM GOAL #2   Title Pt will improve TUG score from 14.57 secs to </= 12 secs without use of SPC for reduced fall risk.    Baseline 14.57 secs - pt carried cane, 9.62 w/o AD    Time 4     Period Weeks    Status Achieved    Target Date 03/17/20      PT SHORT TERM GOAL #3   Title Pt will ambulate 500' without use of SPC on flat even surfaces with SBA for incr. community accessibility.    Baseline 59o' w/ no AD, Mod I    Time 4    Period Weeks    Status Achieved    Target Date 03/17/20      PT SHORT TERM GOAL #4  Title Pt will amb. 6' with horizontal head turns without device with supervision for safety with environmental scanning.    Baseline 115' w/ horizontal head turns, no AD, supervision level    Time 4    Period Weeks    Status Achieved    Target Date 03/17/20      PT SHORT TERM GOAL #5   Title Pt will participate in aquatic therapy after permission obtained from MD (craniotomy incision healed).    Baseline not initiated aquatic therapy at this time    Time 4    Period Weeks    Status On-going    Target Date 03/17/20      PT SHORT TERM GOAL #6   Title Independent in balance HEP.    Baseline Reports independence with exercises, only completing 4/7 days of week    Time 4    Period Weeks    Status Partially Met    Target Date 03/17/20      PT SHORT TERM GOAL #7   Title Perform SOT & establish goal as appropriate.    Baseline performed on 03/22/20 - LTG written as appropriate.    Time 4    Period Weeks    Status Achieved    Target Date 04/21/20             PT Long Term Goals - 04/25/20 1146      PT LONG TERM GOAL #1   Title Pt will improve FGA score to at least a 27/30 in order to demo decr fall risk.ALL LTGS DUE 04/14/20    Baseline 04/10/20: pt improved to 25/30 (was 17/30)    Time 4    Period Weeks    Status Revised      PT LONG TERM GOAL #2   Title Pt will be independent in updated HEP at time of D/C from PT.    Time 4    Period Weeks    Status On-going      PT LONG TERM GOAL #3   Title Pt will improve composite score of SOT to at least 70 in order to demo improved vestibular input for balance.    Baseline composite score: 50, 58 on  04/12/20    Time 4    Period Weeks    Status New                 Plan - 04/26/20 1114    Clinical Impression Statement Continued balance actvities focused on incline/complaint surface with vision removed and horizontal head turns as demo increased balance challenge with these situations. Patient will conitnue to benefit ftrom skilled PT services and progress toward all LTGs.    Personal Factors and Comorbidities Comorbidity 2;Transportation;Behavior Pattern;Comorbidity 3+    Comorbidities cranial nerve VII palsy, craniotomy on 01-27-20 for tumor resection, unilateral vestibular schwannoma, anxiety,, s/p Lt THR Dec. 2020    Examination-Activity Limitations Transfers;Locomotion Level;Squat;Stairs;Stand;Carry    Examination-Participation Restrictions Meal Prep;Cleaning;Community Activity;Driving;Shop;Laundry;Interpersonal Relationship    Stability/Clinical Decision Making Evolving/Moderate complexity    Rehab Potential Good    PT Frequency 2x / week   plus 1x week for 2 weeks   PT Duration 2 weeks   plus 1x week for 2 weeks   PT Treatment/Interventions Aquatic Therapy;Gait training;Stair training;Therapeutic activities;Therapeutic exercise;Balance training;Neuromuscular re-education;Patient/family education;Vestibular    PT Next Visit Plan Potentially check LTG's and discharge next visit? Update/Review HEP    Consulted and Agree with Plan of Care Patient;Family member/caregiver    Family Member Consulted [name]  Patient will benefit from skilled therapeutic intervention in order to improve the following deficits and impairments:  Abnormal gait, Decreased balance, Decreased activity tolerance, Decreased coordination, Dizziness, Impaired vision/preception  Visit Diagnosis: Unsteadiness on feet  Other lack of coordination  Other abnormalities of gait and mobility  Dizziness and giddiness     Problem List Patient Active Problem List   Diagnosis Date Noted  .  Cranial nerve VII palsy   . Hypoalbuminemia due to protein-calorie malnutrition ([place])   . Acute blood loss anemia   . Sinus tachycardia   . Postoperative pain   . 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[place]) 02/03/2020  . Protein-calorie malnutrition, severe 01/30/2020  . Brain tumor ([place]) 01/27/2020  . Low folate 01/23/2020  . Anemia of chronic disease 01/23/2020  . Small bowel obstruction from retained endoscopy capsule at stricture s/p ileal resection 01/19/2020 01/18/2020  . 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 Primary localized osteoarthritis of left hip 2019-09-08  . Death of family member-  husband passed Jul 2020 04/29/2019  . 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 Sleep difficulties 09/09/2018  . Pre-diabetes 03/09/2018  . Postmenopausal state- went thru early 40's 01/02/2018  . Primary osteoarthritis of left hip 01/02/2018  . Caregiver burden- for husband with ALLeukemia 01/02/2018  . Left hip pain 01/02/2018  . Osteopenia 07/31/2016  . Hypertension 05/20/2011  . Menopausal state 05/20/2011    Jones Bales, PT, DPT 04/26/2020, 11:16 AM  [facility] [address] [place], [place], [address] Phone: [phone]   Fax:  [phone]  Name: SHEILA OCASIO MRN: [id number] Date of Birth: 10-Oct-1960

## 2020-04-26 NOTE — Patient Instructions (Signed)
    Strengthening: Resisted Flexion   Hold tubing with _____ arm(s) at side. Pull forward and up. Move shoulder through pain-free range of motion. Repeat __10__ times per set.  Do _1-2_ sessions per day , every other day   Strengthening: Resisted Extension   Hold tubing in _____ hand(s), arm forward. Pull arm back, elbow straight. Repeat _10___ times per set. Do _1-2___ sessions per day, every other day.   Resisted Horizontal Abduction: Bilateral   Sit or stand, tubing in both hands, arms out in front. Keeping arms straight, pinch shoulder blades together and stretch arms out. Repeat _10___ times per set. Do _1-2___ sessions per day, every other day.

## 2020-05-01 ENCOUNTER — Encounter: Payer: BC Managed Care – PPO | Admitting: Speech Pathology

## 2020-05-01 ENCOUNTER — Ambulatory Visit: Payer: BC Managed Care – PPO | Admitting: Physical Therapy

## 2020-05-01 ENCOUNTER — Encounter: Payer: BC Managed Care – PPO | Admitting: Occupational Therapy

## 2020-05-01 ENCOUNTER — Other Ambulatory Visit: Payer: Self-pay

## 2020-05-01 DIAGNOSIS — R2689 Other abnormalities of gait and mobility: Secondary | ICD-10-CM

## 2020-05-01 DIAGNOSIS — R2681 Unsteadiness on feet: Secondary | ICD-10-CM

## 2020-05-01 DIAGNOSIS — R42 Dizziness and giddiness: Secondary | ICD-10-CM

## 2020-05-02 ENCOUNTER — Encounter: Payer: Self-pay | Admitting: Physical Therapy

## 2020-05-02 NOTE — Therapy (Addendum)
[facility] [address] [place], [place], [address] Phone: [phone]   Fax:  [phone]  Physical Therapy Treatment  Patient Details  Name: Cristina Conner MRN: [id number] Date of Birth: 1961/04/03 Referring Provider (PT): Dr. Alger Simons   Encounter Date: 05/01/2020   PT End of Session - 05/02/20 1600    Visit Number 21    Number of Visits 22    Date for PT Re-Evaluation 06/11/20   written for 4 week POC   Authorization Type [employer] - [place]    PT Start Time 0848    PT Stop Time 0931    PT Time Calculation (min) 43 min    Equipment Utilized During Treatment --    Activity Tolerance Patient tolerated treatment well    Behavior During Therapy [facility] for tasks assessed/performed           Past Medical History:  Diagnosis Date  . Anxiety   . Arthritis   . Diverticulosis   . GERD (gastroesophageal reflux disease)   . Hypertension   . Osteopenia   . Post-operative nausea and vomiting    vomiting after general anesthesia per pt.  . Pre-diabetes     Past Surgical History:  Procedure Laterality Date  . CESAREAN SECTION     x 2  . COLONOSCOPY    . CRANIOTOMY Left 01/27/2020   Procedure: Left Craniotomy for Tumor Resection;  Surgeon: Judith Part, MD;  Location: [facility];  Service: Neurosurgery;  Laterality: Left;  . ENDOMETRIAL ABLATION    . ESOPHAGOGASTRODUODENOSCOPY  10/2019  . LAPAROSCOPY N/A 01/19/2020   Procedure: LAPAROSCOPY DIAGNOSTIC LAPAROTOMY WITH SMALL BOWEL RESECTION;  Surgeon: Ileana Roup, MD;  Location: [facility];  Service: General;  Laterality: N/A;  . PELVIC LAPAROSCOPY  1999   left salpingectomy, excision of Right, paratubal cyst  . PELVIC LAPAROSCOPY     laparoscopy with lysis of pelvic adhesions  . SHOULDER SURGERY  11/2010   "FROZEN SHOULDER"  . TOTAL HIP ARTHROPLASTY Left 08/17/2019   Procedure: LEFT TOTAL HIP ARTHROPLASTY ANTERIOR APPROACH;  Surgeon: Melrose Nakayama, MD;   Location: [facility];  Service: Orthopedics;  Laterality: Left;  . TUBAL LIGATION    . UPPER GASTROINTESTINAL ENDOSCOPY    . VENTRICULOSTOMY Left 01/27/2020   Procedure: VENTRICULOSTOMY;  Surgeon: Judith Part, MD;  Location: [facility];  Service: Neurosurgery;  Laterality: Left;    There were no vitals filed for this visit.   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    Change in Gait Speed Able to  smoothly change walking speed without loss of balance or gait deviation. Deviate no more than 6 in outside of the 12 in walkway width.    Gait with Horizontal Head Turns Performs head turns smoothly with no change in gait. Deviates no more than 6 in outside 12 in walkway width    Gait with Vertical Head Turns Performs head turns with no change in gait. Deviates no more than 6 in outside 12 in walkway width.    Gait and Pivot Turn Pivot turns safely within 3 sec and stops quickly with no loss of balance.    Step Over Obstacle Is able to step over 2 stacked shoe boxes taped together (9 in total height) without changing gait speed. No evidence of imbalance.    Gait with Narrow Base of Support Ambulates 7-9 steps.    Gait with Eyes Closed Walks  20 ft, uses assistive device, slower speed, mild gait deviations, deviates 6-10 in outside 12 in walkway width. Ambulates 20 ft in less than 9 sec but greater than 7 sec.    Ambulating Backwards Walks 20 ft, no assistive devices, good speed, no evidence for imbalance, normal gait    Steps Alternating feet, no rail.    Total Score 28           sensory organization test - composite score 75/100 (WNL's)  Somatosensory, visual & vestibular inputs are all WNL's  Condition 1 - all 3 trials WNL's Condition 2 - trials 1 and 2 WNL's with trial 2 slightly below N Condition 3 - all 3 trials WNL's Condition 4 - all 3 trials WNL's Condition 5 - all 3 trials WNL's Condition 6 - trial 1 below N:  Trials 2 & 3 WNL's                        PT Short Term Goals - 05/01/20 0854      PT SHORT TERM GOAL #1   Title Improve Berg balance score by at least 4 points for reduced fall risk.    Baseline BERG assessed on 02/17/20, scored 50/56; 53/56 on 03/15/20    Time 4    Period Weeks    Status On-going    Target Date 03/17/20      PT SHORT TERM GOAL #2   Title Pt will improve TUG score from 14.57 secs to </= 12 secs without use of SPC for reduced fall risk.    Baseline 14.57 secs - pt carried cane, 9.62 w/o AD    Time 4    Period Weeks    Status Achieved    Target Date 03/17/20      PT SHORT TERM GOAL #3   Title Pt will ambulate 500' without use of SPC on flat even surfaces with SBA for incr. community accessibility.    Baseline 59o' w/ no AD, Mod I    Time 4    Period Weeks    Status Achieved    Target Date 03/17/20      PT SHORT TERM GOAL #4   Title Pt will amb. 39' with horizontal head turns without device with supervision for safety with environmental scanning.    Baseline 115' w/ horizontal head turns, no AD, supervision level    Time 4    Period Weeks    Status Achieved    Target Date 03/17/20      PT SHORT TERM GOAL #5   Title Pt will participate in aquatic  therapy  after permission obtained from MD (craniotomy incision healed).    Baseline not initiated aquatic therapy at this time    Time 4    Period Weeks    Status On-going    Target Date 03/17/20      PT SHORT TERM GOAL #6   Title Independent in balance HEP.    Baseline Reports independence with exercises, only completing 4/7 days of week    Time 4    Period Weeks    Status Partially Met    Target Date 03/17/20      PT SHORT TERM GOAL #7   Title Perform SOT & establish goal as appropriate.    Baseline performed on 03/22/20 - LTG written as appropriate.    Time 4    Period Weeks    Status Achieved    Target Date 04/21/20             PT Long Term Goals - 05/01/20 0855      PT LONG TERM GOAL #1   Title Pt will improve FGA score to at least a 27/30 in order to demo decr fall risk.ALL LTGS DUE 04/14/20    Baseline 04/10/20: pt improved to 25/30 (was 17/30); score 28/30 on 05-01-20    Time 4    Period Weeks    Status Achieved      PT LONG TERM GOAL #2   Title Pt will be independent in updated HEP at time of D/C from PT.    Time 4    Period Weeks    Status Achieved      PT LONG TERM GOAL #3   Title Pt will improve composite score of SOT to at least 70 in order to demo improved vestibular input for balance.    Baseline composite score: 50, 58 on 04/12/20; 75/100 on 05-01-20 (WNL's)    Time 4    Period Weeks    Status Achieved      PT LONG TERM GOAL #4   Title Pt will be independent in updated HEP including aquatic exercises at time of D/C from PT.    Baseline aquatic ex. deferred due to Lt eye problem    Status Achieved      PT LONG TERM GOAL #5   Title Pt will amb. 46' with head turns with no c/o dizziness and no imbalance for incr. safety with environmental scanning.    Status Achieved      PT LONG TERM GOAL #6   Title Pt will improve composite score of SOT to at least 70 in order to demo improved vestibular input for balance.    Status Achieved                  Plan - 05/02/20 1948    Clinical Impression Statement Pt has met all LTGs; SOT score is now WNL's for composite and vestibular input.  Pt has made excellent progress - is discharged due to goals met.    Personal Factors and Comorbidities Comorbidity 2;Transportation;Behavior Pattern;Comorbidity 3+    Comorbidities cranial nerve VII palsy, craniotomy on 01-27-20 for tumor resection, unilateral vestibular schwannoma, anxiety,, s/p Lt THR Dec. 2020    Examination-Activity Limitations Transfers;Locomotion Level;Squat;Stairs;Stand;Carry    Examination-Participation Restrictions Meal Prep;Cleaning;Community Activity;Driving;Shop;Laundry;Interpersonal Relationship    Stability/Clinical Decision Making Evolving/Moderate complexity    Rehab Potential Good    PT Frequency 2x / week   plus 1x week for 2 weeks   PT Duration 2 weeks   plus 1x week for  2 weeks   PT Treatment/Interventions Aquatic Therapy;Gait training;Stair training;Therapeutic activities;Therapeutic exercise;Balance training;Neuromuscular re-education;Patient/family education;Vestibular    PT Next Visit Plan N/A - D/C    Consulted and Agree with Plan of Care Patient;Family member/caregiver    Family Member Consulted [name]           Patient will benefit from skilled therapeutic intervention in order to improve the following deficits and impairments:  Abnormal gait, Decreased balance, Decreased activity tolerance, Decreased coordination, Dizziness, Impaired vision/preception  Visit Diagnosis: Unsteadiness on feet  Dizziness and giddiness  Other abnormalities of gait and mobility     Problem List Patient Active Problem List   Diagnosis Date Noted  . Cranial nerve VII palsy   . Hypoalbuminemia due to protein-calorie malnutrition ([place])   . Acute blood loss anemia   . Sinus tachycardia   . Postoperative pain   . 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 Protein-calorie malnutrition, severe  01/30/2020  . Brain tumor ([place]) 01/27/2020  . Low folate 01/23/2020  . Anemia of chronic disease 01/23/2020  . Small bowel obstruction from retained endoscopy capsule at stricture s/p ileal resection 01/19/2020 01/18/2020  . 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 Primary localized osteoarthritis of left hip 08/27/19  . Death of family member-  husband passed Jul 2020 04/29/2019  . 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 Sleep difficulties 09/09/2018  . Pre-diabetes 03/09/2018  . Postmenopausal state- went thru early 40's 01/02/2018  . Primary osteoarthritis of left hip 01/02/2018  . Caregiver burden- for husband with ALLeukemia 01/02/2018  . Left hip pain 01/02/2018  . Osteopenia 07/31/2016  . Hypertension 05/20/2011  . Menopausal state 05/20/2011    PHYSICAL THERAPY DISCHARGE SUMMARY  Visits from Start of Care:  21  Current functional level related to goals / functional outcomes: See above for progress towards goals - all goals met   Remaining deficits: Decreased high level balance skills; Lt eye is patched due to scratched cornea - lack of vision out of Lt eye impacts balance   Education / Equipment: Pt has been instructed in HEP for balance and vestibular exercises Plan: Patient agrees to discharge.  Patient goals were met. Patient is being discharged due to meeting the stated rehab goals.  ?????       Alda Lea, PT 05/02/2020, 7:50 PM  [facility] [address] [place], [place], [address] Phone: [phone]   Fax:  [phone]  Name: PAYLIN HAILU MRN: [id number] Date of Birth: 01/06/1961

## 2020-05-03 ENCOUNTER — Encounter: Payer: BC Managed Care – PPO | Admitting: Physical Medicine & Rehabilitation

## 2020-05-10 ENCOUNTER — Ambulatory Visit: Payer: BC Managed Care – PPO

## 2020-05-10 ENCOUNTER — Encounter: Payer: BC Managed Care – PPO | Admitting: Occupational Therapy

## 2020-05-17 ENCOUNTER — Encounter: Payer: BC Managed Care – PPO | Admitting: Occupational Therapy

## 2020-05-17 ENCOUNTER — Ambulatory Visit: Payer: BC Managed Care – PPO

## 2020-05-22 ENCOUNTER — Telehealth: Payer: Self-pay | Admitting: Physician Assistant

## 2020-05-22 ENCOUNTER — Other Ambulatory Visit (HOSPITAL_COMMUNITY): Payer: Self-pay | Admitting: Physical Medicine & Rehabilitation

## 2020-05-22 ENCOUNTER — Other Ambulatory Visit: Payer: Self-pay | Admitting: Physician Assistant

## 2020-05-22 MED ORDER — METOPROLOL TARTRATE 25 MG PO TABS: 25.0000 mg | ORAL_TABLET | Freq: Two times a day (BID) | ORAL | 0 refills | Status: DC

## 2020-05-22 NOTE — Telephone Encounter (Signed)
Please call patient to schedule apt for further med refills.  ° °AS,CMA °

## 2020-05-31 ENCOUNTER — Other Ambulatory Visit: Payer: Self-pay

## 2020-05-31 ENCOUNTER — Encounter: Payer: Self-pay | Admitting: Physical Medicine & Rehabilitation

## 2020-05-31 ENCOUNTER — Encounter
Payer: BC Managed Care – PPO | Attending: Physical Medicine & Rehabilitation | Admitting: Physical Medicine & Rehabilitation

## 2020-05-31 VITALS — BP 159/92 | HR 70 | Temp 98.8°F | Ht 66.0 in | Wt 121.0 lb

## 2020-05-31 DIAGNOSIS — Z5181 Encounter for therapeutic drug level monitoring: Secondary | ICD-10-CM | POA: Diagnosis present

## 2020-05-31 DIAGNOSIS — G894 Chronic pain syndrome: Secondary | ICD-10-CM | POA: Diagnosis not present

## 2020-05-31 DIAGNOSIS — Z79891 Long term (current) use of opiate analgesic: Secondary | ICD-10-CM | POA: Diagnosis present

## 2020-05-31 DIAGNOSIS — D333 Benign neoplasm of cranial nerves: Secondary | ICD-10-CM | POA: Insufficient documentation

## 2020-05-31 DIAGNOSIS — G510 Bell's palsy: Secondary | ICD-10-CM | POA: Insufficient documentation

## 2020-05-31 NOTE — Progress Notes (Signed)
Subjective:    Patient ID: Cristina Conner, female    DOB: [birth year], 59 y.o.   MRN: [id number]  HPI   Cristina Conner is here in f/u of her vestibular schwannoma. She was seen by Dr. Frederico Hamman and she was ultimately referred to [facility] for a lid weight, ?suture. She has opacitiy in the eye still with film, drainage. She is aggressive with her eye gtts. She wears an eye patch continuously  She is walking daily. She cooks, cleans, etc without issues. She doesn't drive. Balance is improving. She takes her time.   She will have intermittent pain along the left side of her head. It sometims is  Severe but it's typically fleeting. Tramadol helps for severe pain. She uses tylenol for lesser pain.   Pain Inventory Average Pain 3 Pain Right Now 0 My pain is intermittent and sharp  In the last 24 hours, has pain interfered with the following? General activity 0 Relation with others 0 Enjoyment of life 0 What TIME of day is your pain at its worst? varies Sleep (in general) Good  Pain is worse with: unsure Pain improves with: medication Relief from Meds: 8  Family History  Problem Relation Age of Onset  . Hypertension Mother   . Heart disease Mother   . Diabetes Mother   . Hypertension Father   . Colon cancer Neg Hx   . Esophageal cancer Neg Hx   . Stomach cancer Neg Hx   . Rectal cancer Neg Hx   . Colon polyps Neg Hx    Social History   Socioeconomic History  . Marital status: Widowed    Spouse name: Not on file  . Number of children: Not on file  . Years of education: Not on file  . Highest education level: Not on file  Occupational History  . Not on file  Tobacco Use  . Smoking status: Never Smoker  . Smokeless tobacco: Never Used  Vaping Use  . Vaping Use: Never used  Substance and Sexual Activity  . Alcohol use: Not Currently  . Drug use: No  . Sexual activity: Not Currently    Partners: Male    Birth control/protection: Post-menopausal    Comment: 1st intercourse- 89,  partners- 16,  married- 5 yrs   Other Topics Concern  . Not on file  Social History Narrative   Widowed in summer 2020   Former smoker no alcohol or drug use   Social Determinants of Radio broadcast assistant Strain:   . Difficulty of Paying Living Expenses: Not on file  Food Insecurity:   . Worried About Charity fundraiser in the Last Year: Not on file  . Ran Out of Food in the Last Year: Not on file  Transportation Needs:   . Lack of Transportation (Medical): Not on file  . Lack of Transportation (Non-Medical): Not on file  Physical Activity:   . Days of Exercise per Week: Not on file  . Minutes of Exercise per Session: Not on file  Stress:   . Feeling of Stress : Not on file  Social Connections:   . Frequency of Communication with Friends and Family: Not on file  . Frequency of Social Gatherings with Friends and Family: Not on file  . Attends Religious Services: Not on file  . Active Member of Clubs or Organizations: Not on file  . Attends Archivist Meetings: Not on file  . Marital Status: Not on file   Past Surgical History:  Procedure Laterality Date  . CESAREAN SECTION     x 2  . COLONOSCOPY    . CRANIOTOMY Left 01/27/2020   Procedure: Left Craniotomy for Tumor Resection;  Surgeon: Judith Part, MD;  Location: [facility];  Service: Neurosurgery;  Laterality: Left;  . ENDOMETRIAL ABLATION    . ESOPHAGOGASTRODUODENOSCOPY  10/2019  . LAPAROSCOPY N/A 01/19/2020   Procedure: LAPAROSCOPY DIAGNOSTIC LAPAROTOMY WITH SMALL BOWEL RESECTION;  Surgeon: Ileana Roup, MD;  Location[facility];  Service: General;  Laterality: N/A;  . PELVIC LAPAROSCOPY  1999   left salpingectomy, excision of Right, paratubal cyst  . PELVIC LAPAROSCOPY     laparoscopy with lysis of pelvic adhesions  . SHOULDER SURGERY  11/2010   "FROZEN SHOULDER"  . TOTAL HIP ARTHROPLASTY Left 08/17/2019   Procedure: LEFT TOTAL HIP ARTHROPLASTY ANTERIOR APPROACH;  Surgeon: Melrose Nakayama, MD;   [facility];  Service: Orthopedics;  Laterality: Left;  . TUBAL LIGATION    . UPPER GASTROINTESTINAL ENDOSCOPY    . VENTRICULOSTOMY Left 01/27/2020   Procedure: VENTRICULOSTOMY;  Surgeon: Judith Part, MD;  Location: [facility];  Service: Neurosurgery;  Laterality: Left;   Past Surgical History:  Procedure Laterality Date  . CESAREAN SECTION     x 2  . COLONOSCOPY    . CRANIOTOMY Left 01/27/2020   Procedure: Left Craniotomy for Tumor Resection;  Surgeon: Judith Part, MD;  Location: [facility];  Service: Neurosurgery;  Laterality: Left;  . ENDOMETRIAL ABLATION    . ESOPHAGOGASTRODUODENOSCOPY  10/2019  . LAPAROSCOPY N/A 01/19/2020   Procedure: LAPAROSCOPY DIAGNOSTIC LAPAROTOMY WITH SMALL BOWEL RESECTION;  Surgeon: Ileana Roup, MD;  [facility];  Service: General;  Laterality: N/A;  . PELVIC LAPAROSCOPY  1999   left salpingectomy, excision of Right, paratubal cyst  . PELVIC LAPAROSCOPY     laparoscopy with lysis of pelvic adhesions  . SHOULDER SURGERY  11/2010   "FROZEN SHOULDER"  . TOTAL HIP ARTHROPLASTY Left 08/17/2019   Procedure: LEFT TOTAL HIP ARTHROPLASTY ANTERIOR APPROACH;  Surgeon: Melrose Nakayama, MD;  [facility];  Service: Orthopedics;  Laterality: Left;  . TUBAL LIGATION    . UPPER GASTROINTESTINAL ENDOSCOPY    . VENTRICULOSTOMY Left 01/27/2020   Procedure: VENTRICULOSTOMY;  Surgeon: Judith Part, MD;  Location: [facility];  Service: Neurosurgery;  Laterality: Left;   Past Medical History:  Diagnosis Date  . Anxiety   . Arthritis   . Diverticulosis   . GERD (gastroesophageal reflux disease)   . Hypertension   . Osteopenia   . Post-operative nausea and vomiting    vomiting after general anesthesia per pt.  . Pre-diabetes    BP (!) 159/92   Pulse 70   Temp 98.8 F (37.1 C)   Ht 5\' 6"  (1.676 m)   Wt 121 lb (54.9 kg)   LMP 05/01/2009   SpO2 99%   BMI 19.53 kg/m   Opioid Risk Score:   Fall Risk Score:  `1  Depression screen PHQ  2/9  Depression screen [facility] 2/9 03/01/2020 12/07/2019 09/20/2019 09/07/2019 08/09/2019 07/19/2019 07/02/2019  Decreased Interest 1 3 1 1 1 2 1   Down, Depressed, Hopeless 0 3 2 0 0 2 1  PHQ - 2 Score 1 6 3 1 1 4 2   Altered sleeping 1 2 1  0 1 2 1   Tired, decreased energy 1 3 2 3 1 2  0  Change in appetite 0 2 2 1  0 0 0  Feeling bad or failure about yourself  0 2 0 0 0 0 0  Trouble concentrating 0 2 0 0 0 0 0  Moving slowly or fidgety/restless 1 2 3  0 0 2 0  Suicidal thoughts 0 1 0 0 0 0 0  PHQ-9 Score 4 20 11 5 3 10 3   Difficult doing work/chores Somewhat difficult Somewhat difficult Very difficult Not difficult at all Not difficult at all Somewhat difficult Somewhat difficult  Some recent data might be hidden    Review of Systems  Constitutional: Negative.   HENT: Negative.   Eyes: Negative.   Respiratory: Negative.   Cardiovascular: Negative.   Gastrointestinal: Negative.   Endocrine: Negative.   Genitourinary: Negative.   Musculoskeletal: Negative.   Skin: Negative.   Allergic/Immunologic: Negative.   Neurological: Positive for headaches.  Hematological: Negative.   Psychiatric/Behavioral: Negative.   All other systems reviewed and are negative.      Objective:   Physical Exam Gen: no distress, normal appearing HEENT: oral mucosa pink and moist, NCAT, sclera left eye very red.  Area is quite lubricated.  She is wearing her eye patch.  Extraocular eye movements are intact. Cardio: Reg rate Chest: normal effort, normal rate of breathing Abd: soft, non-distended Ext: no edema Skin: intact Neuro: closes eye lid to 50%. Left facial droop, tongue devitation. Numbness along left hemi-face. Balance and gait much improved. Romberg negative. cognitively she showed reasonable insight and awareness concentration and short-term memory.  Strength is grossly 4+ to 5 out of 5 in all 4 limbs..  Mild left hearing loss Musculoskeletal: No focal deficits.  Normal range of motion Psych: pleasant,  normal affect            Assessment & Plan:  1.Impaired function due to vestibular schwannoma with impaired balance, gait and L hearing loss and dysphagia, and mild/moderate cognitive issues              -HEP  -driving once discharged from ophthalmology 2.  Pain: Really intermittent at best.  Continue Tylenol as needed             -May use tramadol for severe pain---no RF needed today 4. Left VII nerve injury: Continue eye drops 5 X day and patch at night/when asleep. May use systane             -pending lid weight, ?suturing? 5. ABLA: may use iron/B complex with mulit-vitamin to decrease some of the pills that she is taking daily 11. HTN:             fair control at present with metoprolol.  I will make no changes today 13. Left hip pain s/p THR 12/20:              -stable 7. Vestibular dysfunction: improving.    15 minutes of face to face patient care time were spent during this visit. All questions were encouraged and answered.  Follow up with me in 6 mos .

## 2020-05-31 NOTE — Patient Instructions (Signed)
PLEASE FEEL FREE TO CALL OUR OFFICE WITH ANY PROBLEMS OR QUESTIONS ([phone])      

## 2020-07-04 MED ORDER — TRAMADOL HCL 50 MG PO TABS: 50.0000 mg | ORAL_TABLET | Freq: Every day | ORAL | 1 refills | Status: DC | PRN

## 2020-07-04 NOTE — Telephone Encounter (Signed)
Tramadol RF'ed

## 2020-07-13 ENCOUNTER — Other Ambulatory Visit: Payer: Self-pay | Admitting: Physician Assistant

## 2020-07-24 ENCOUNTER — Other Ambulatory Visit: Payer: Self-pay | Admitting: Physician Assistant

## 2020-07-25 ENCOUNTER — Ambulatory Visit (INDEPENDENT_AMBULATORY_CARE_PROVIDER_SITE_OTHER): Payer: BC Managed Care – PPO | Admitting: Physician Assistant

## 2020-07-25 ENCOUNTER — Encounter: Payer: Self-pay | Admitting: Physician Assistant

## 2020-07-25 ENCOUNTER — Other Ambulatory Visit: Payer: Self-pay

## 2020-07-25 VITALS — BP 160/89 | HR 68 | Temp 98.2°F | Ht 65.5 in | Wt 117.7 lb

## 2020-07-25 DIAGNOSIS — D333 Benign neoplasm of cranial nerves: Secondary | ICD-10-CM | POA: Diagnosis not present

## 2020-07-25 DIAGNOSIS — Z23 Encounter for immunization: Secondary | ICD-10-CM

## 2020-07-25 DIAGNOSIS — D649 Anemia, unspecified: Secondary | ICD-10-CM

## 2020-07-25 DIAGNOSIS — Z0000 Encounter for general adult medical examination without abnormal findings: Secondary | ICD-10-CM | POA: Diagnosis not present

## 2020-07-25 DIAGNOSIS — I10 Essential (primary) hypertension: Secondary | ICD-10-CM

## 2020-07-25 DIAGNOSIS — D638 Anemia in other chronic diseases classified elsewhere: Secondary | ICD-10-CM

## 2020-07-25 DIAGNOSIS — G510 Bell's palsy: Secondary | ICD-10-CM

## 2020-07-25 DIAGNOSIS — R7303 Prediabetes: Secondary | ICD-10-CM | POA: Diagnosis not present

## 2020-07-25 NOTE — Progress Notes (Signed)
Female Physical   Impression and Recommendations:    1. Healthcare maintenance   2. 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 Need for shingles vaccine   4. Pre-diabetes   5. Hypertension, unspecified type   6. Hemoglobin low   7. Anemia of chronic disease   8. Cranial nerve VII palsy      1) Anticipatory Guidance: Discussed skin CA prevention and sunscreen when outside along with skin surveillance; eating a balanced and modest diet; physical activity at least 25 minutes per day or minimum of 150 min/ week moderate to intense activity.  2) Immunizations / Screenings / Labs:   All immunizations are up-to-date per recommendations or will be updated today if pt allows.    - Patient understands with dental and vision screens they will schedule independently.  - Will obtain CBC, CMP, HgA1c, direct LDL (pt non fasting), and TSH. - UTD on mammogram and pap smear (followed by OB-GYN, has upcoming appointment) - UTD on colonoscopy, Tdap, HIV screening and influenza vaccine. - Agreeable to Shingrix and Hep C screening.  3) Weight: Continue diet habits to improve overall feelings of well being and objective health data. Improve nutrient density of diet through increasing intake of fruits and vegetables and decreasing saturated fats, white flour products and refined sugars.  4) Healthcare Maintenance: -Continue to follow up with various specialists. -Continue with therapy sessions and use good support system. -Continue current medication regimen. Take Lunesta as needed for severe insomnia and do not take with Trazodone. Pt aware. -Will notify of lab results once available. -BP elevated today. Advised to monitor BP at home and if consistently >140/90 to notify the office for medication adjustments. -Follow up for HTN in 3-4 months.    No orders of the defined types were placed in this encounter.   Orders Placed This Encounter  Procedures   Varicella-zoster vaccine IM (Shingrix)    CBC with Differential/Platelet   Comprehensive metabolic panel   Hemoglobin A1c   TSH   Direct LDL     Return for HTN, mood in 3-4 months.      Gross side effects, risk and benefits, and alternatives of medications discussed with patient.  Patient is aware that all medications have potential side effects and we are unable to predict every side effect or drug-drug interaction that may occur.  Expresses verbal understanding and consents to current therapy plan and treatment regimen.  F-up preventative CPE in 1 year-this is in addition to any chronic care visits.    Please see orders placed and AVS handed out to patient at the end of our visit for further patient instructions/ counseling done pertaining to today's office visit.  Note:  This note was prepared with assistance of Dragon voice recognition software. Occasional wrong-word or sound-a-like substitutions may have occurred due to the inherent limitations of voice recognition software.     Subjective:     CPE HPI: Cristina Conner is a 59 y.o. female who presents to [place] [facility] today for a yearly health maintenance exam.   Health Maintenance Summary  - Reviewed and updated, unless pt declines services.  Last Cologuard or Colonoscopy:   10/13/2012- repeat in 10 years Tobacco History Reviewed:  Y, never a smoker Alcohol and/or drug use:    No concerns; no use Exercise Habits:   Not meeting AHA guidelines Eye exams: Y Female Health:  PAP Smear - last known results: 08/01/2017- negative, followed by OB-GYN STD concerns:   none  Menses regular:   Postmenopausal  Lumps or breast concerns:  none Breast Cancer Family History:  No    Additional concerns beyond health maintenance issues: Patient continues to recover from vestibular schwannoma surgery, has completed PT/OT/Speech therapy. Followed by various eye specialists for paralytic lagophthalmus. Continues to see Dr. Naaman Plummer for vestibular  schwannoma, chronic pain syndrome and CN VII palsy.    Immunization History  Administered Date(s) Administered   Influenza,inj,Quad PF,6+ Mos 06/18/2017, 06/19/2018, 05/13/2019   Influenza-Unspecified 05/04/2020   PFIZER SARS-COV-2 Vaccination 11/24/2019, 06/19/2020   Tdap 08/24/2012   Zoster Recombinat (Shingrix) 07/25/2020     Health Maintenance  Topic Date Due   Hepatitis C Screening  Never done   PAP SMEAR-Modifier  08/01/2018   MAMMOGRAM  10/18/2020   TETANUS/TDAP  08/24/2022   COLONOSCOPY  10/13/2022   INFLUENZA VACCINE  Completed   COVID-19 Vaccine  Completed   HIV Screening  Completed     Wt Readings from Last 3 Encounters:  07/25/20 117 lb 11.2 oz (53.4 kg)  05/31/20 121 lb (54.9 kg)  03/01/20 124 lb (56.2 kg)   BP Readings from Last 3 Encounters:  07/25/20 (!) 160/89  05/31/20 (!) 159/92  03/13/20 138/86   Pulse Readings from Last 3 Encounters:  07/25/20 68  05/31/20 70  03/01/20 68     Past Medical History:  Diagnosis Date   Anxiety    Arthritis    Diverticulosis    GERD (gastroesophageal reflux disease)    Hypertension    Osteopenia    Post-operative nausea and vomiting    vomiting after general anesthesia per pt.   Pre-diabetes       Past Surgical History:  Procedure Laterality Date   BRAIN SURGERY N/A    Phreesia 07/22/2020   CESAREAN SECTION     x 2   COLONOSCOPY     CRANIOTOMY Left 01/27/2020   Procedure: Left Craniotomy for Tumor Resection;  Surgeon: Judith Part, MD;  Location: [facility];  Service: Neurosurgery;  Laterality: Left;   ENDOMETRIAL ABLATION     ESOPHAGOGASTRODUODENOSCOPY  10/2019   LAPAROSCOPY N/A 01/19/2020   Procedure: LAPAROSCOPY DIAGNOSTIC LAPAROTOMY WITH SMALL BOWEL RESECTION;  Surgeon: Ileana Roup, MD;  Location: [facility];  Service: General;  Laterality: N/A;   PELVIC LAPAROSCOPY  1999   left salpingectomy, excision of Right, paratubal cyst   PELVIC LAPAROSCOPY      laparoscopy with lysis of pelvic adhesions   SHOULDER SURGERY  11/2010   "FROZEN SHOULDER"   TOTAL HIP ARTHROPLASTY Left 08/17/2019   Procedure: LEFT TOTAL HIP ARTHROPLASTY ANTERIOR APPROACH;  Surgeon: Melrose Nakayama, MD;  Location: [facility];  Service: Orthopedics;  Laterality: Left;   TUBAL LIGATION     UPPER GASTROINTESTINAL ENDOSCOPY     VENTRICULOSTOMY Left 01/27/2020   Procedure: VENTRICULOSTOMY;  Surgeon: Judith Part, MD;  Location: [place];  Service: Neurosurgery;  Laterality: Left;      Family History  Problem Relation Age of Onset   Hypertension Mother    Heart disease Mother    Diabetes Mother    Hypertension Father    Colon cancer Neg Hx    Esophageal cancer Neg Hx    Stomach cancer Neg Hx    Rectal cancer Neg Hx    Colon polyps Neg Hx       Social History   Substance and Sexual Activity  Drug Use No  ,   Social History   Substance and Sexual Activity  Alcohol Use Not Currently  ,  Social History   Tobacco Use  Smoking Status Never Smoker  Smokeless Tobacco Never Used  ,   Social History   Substance and Sexual Activity  Sexual Activity Not Currently   Partners: Male   Birth control/protection: Post-menopausal   Comment: 1st intercourse- 19, partners- 82,  married- 74 yrs     Current Outpatient Medications on File Prior to Visit  Medication Sig Dispense Refill   acetaminophen (TYLENOL) 500 MG tablet Take 1-2 tablets (500-1,000 mg total) by mouth every 4 (four) hours as needed for moderate pain. 30 tablet 0   artificial tears (LACRILUBE) OINT ophthalmic ointment Place into both eyes at bedtime. 7 g 1   eszopiclone (LUNESTA) 1 MG TABS tablet Take 1 mg by mouth at bedtime as needed for sleep. Take immediately before bedtime     Polyethyl Glycol-Propyl Glycol (SYSTANE) 0.4-0.3 % GEL ophthalmic gel Apply to eye as needed.     traMADol (ULTRAM) 50 MG tablet Take 1 tablet (50 mg total) by mouth daily as needed. 30 tablet 1    traZODone (DESYREL) 50 MG tablet Take 0.5-1 tablets (25-50 mg total) by mouth at bedtime as needed for sleep. 10 tablet 0   metoprolol tartrate (LOPRESSOR) 25 MG tablet Take 1 tablet (25 mg total) by mouth 2 (two) times daily. 180 tablet 0   No current facility-administered medications on file prior to visit.    Allergies: Hydrocodone and Other  Review of Systems: General:   Denies fever, chills, unexplained weight loss.  Optho/Auditory:   Denies visual changes, +blurred vision/LOV Respiratory:   Denies SOB, DOE more than baseline levels.   Cardiovascular:   Denies chest pain, palpitations, new onset peripheral edema  Gastrointestinal:   Denies nausea, vomiting, diarrhea.  Genitourinary: Denies dysuria, freq/ urgency, flank pain  Endocrine:     Denies hot or cold intolerance, polyuria, polydipsia. Musculoskeletal:   Denies unexplained myalgias, joint swelling, unexplained arthralgias, gait problems.  Skin:  Denies rash, suspicious lesions Neurological:     Denies dizziness, unexplained weakness, numbness  Psychiatric/Behavioral:   Denies mood changes, suicidal or homicidal ideations, hallucinations    Objective:    Blood pressure (!) 160/89, pulse 68, temperature 98.2 F (36.8 C), temperature source Oral, height 5' 5.5" (1.664 m), weight 117 lb 11.2 oz (53.4 kg), last menstrual period 05/01/2009, SpO2 100 %. Body mass index is 19.29 kg/m. 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)  Ears:    Normal TM's and external ear canals, both ears  Nose:   Nares normal, mucosa normal, no drainage    or sinus tenderness  Throat:   Lips w/o lesion, mucosa moist; teeth and gums normal   Neck:   Supple, symmetrical, trachea midline, no adenopathy;    thyroid:  no enlargement/tenderness/nodules;  no JVD  Back:     Symmetric, no obvious curvature, ROM normal, no CVA tenderness  Lungs:     Clear to auscultation bilaterally, respirations unlabored, no       Wh/ R/ R  Chest Wall:    No tenderness or gross deformity; normal excursion   Heart:    Regular rate and rhythm, S1 and S2 normal, no murmur, rub   or gallop  Breast Exam:    Deferred to Ob-Gyn.  Abdomen:     Soft, non-tender, bowel sounds active  all four quadrants, No   G/R/R, no masses, no organomegaly  Genitalia:   Deferred to Ob-Gyn.   Rectal:   Deferred to Ob-Gyn.  Extremities:   Extremities normal, atraumatic, no cyanosis or gross edema  Pulses:   Normal  Skin:   Warm, dry, Skin color, texture, turgor normal, no obvious rashes or lesions Psych: No HI/SI, judgement and insight good, Euthymic mood. Full Affect.  Neurologic:   CNII-XII grossly intact with exception of CN VII, sensation to light touch

## 2020-07-25 NOTE — Patient Instructions (Signed)
Preventive Care 40-58 Years Old, Female °Preventive care refers to visits with your health care provider and lifestyle choices that can promote health and wellness. This includes: °· A yearly physical exam. This may also be called an annual well check. °· Regular dental visits and eye exams. °· Immunizations. °· Screening for certain conditions. °· Healthy lifestyle choices, such as eating a healthy diet, getting regular exercise, not using drugs or products that contain nicotine and tobacco, and limiting alcohol use. °What can I expect for my preventive care visit? °Physical exam °Your health care provider will check your: °· Height and weight. This may be used to calculate body mass index (BMI), which tells if you are at a healthy weight. °· Heart rate and blood pressure. °· Skin for abnormal spots. °Counseling °Your health care provider may ask you questions about your: °· Alcohol, tobacco, and drug use. °· Emotional well-being. °· Home and relationship well-being. °· Sexual activity. °· Eating habits. °· Work and work environment. °· Method of birth control. °· Menstrual cycle. °· Pregnancy history. °What immunizations do I need? ° °Influenza (flu) vaccine °· This is recommended every year. °Tetanus, diphtheria, and pertussis (Tdap) vaccine °· You may need a Td booster every 10 years. °Varicella (chickenpox) vaccine °· You may need this if you have not been vaccinated. °Zoster (shingles) vaccine °· You may need this after age 60. °Measles, mumps, and rubella (MMR) vaccine °· You may need at least one dose of MMR if you were born in 1957 or later. You may also need a second dose. °Pneumococcal conjugate (PCV13) vaccine °· You may need this if you have certain conditions and were not previously vaccinated. °Pneumococcal polysaccharide (PPSV23) vaccine °· You may need one or two doses if you smoke cigarettes or if you have certain conditions. °Meningococcal conjugate (MenACWY) vaccine °· You may need this if you  have certain conditions. °Hepatitis A vaccine °· You may need this if you have certain conditions or if you travel or work in places where you may be exposed to hepatitis A. °Hepatitis B vaccine °· You may need this if you have certain conditions or if you travel or work in places where you may be exposed to hepatitis B. °Haemophilus influenzae type b (Hib) vaccine °· You may need this if you have certain conditions. °Human papillomavirus (HPV) vaccine °· If recommended by your health care provider, you may need three doses over 6 months. °You may receive vaccines as individual doses or as more than one vaccine together in one shot (combination vaccines). Talk with your health care provider about the risks and benefits of combination vaccines. °What tests do I need? °Blood tests °· Lipid and cholesterol levels. These may be checked every 5 years, or more frequently if you are over 50 years old. °· Hepatitis C test. °· Hepatitis B test. °Screening °· Lung cancer screening. You may have this screening every year starting at age 55 if you have a 30-pack-year history of smoking and currently smoke or have quit within the past 15 years. °· Colorectal cancer screening. All adults should have this screening starting at age 50 and continuing until age 75. Your health care provider may recommend screening at age 45 if you are at increased risk. You will have tests every 1-10 years, depending on your results and the type of screening test. °· Diabetes screening. This is done by checking your blood sugar (glucose) after you have not eaten for a while (fasting). You may have this   done every 1-3 years.  Mammogram. This may be done every 1-2 years. Talk with your health care provider about when you should start having regular mammograms. This may depend on whether you have a family history of breast cancer.  BRCA-related cancer screening. This may be done if you have a family history of breast, ovarian, tubal, or peritoneal  cancers.  Pelvic exam and Pap test. This may be done every 3 years starting at age 76. Starting at age 89, this may be done every 5 years if you have a Pap test in combination with an HPV test. Other tests  Sexually transmitted disease (STD) testing.  Bone density scan. This is done to screen for osteoporosis. You may have this scan if you are at high risk for osteoporosis. Follow these instructions at home: Eating and drinking  Eat a diet that includes fresh fruits and vegetables, whole grains, lean protein, and low-fat dairy.  Take vitamin and mineral supplements as recommended by your health care provider.  Do not drink alcohol if: ? Your health care provider tells you not to drink. ? You are pregnant, may be pregnant, or are planning to become pregnant.  If you drink alcohol: ? Limit how much you have to 0-1 drink a day. ? Be aware of how much alcohol is in your drink. In the [place] one drink equals one 12 oz bottle of beer (355 mL), one 5 oz glass of wine (148 mL), or one 1 oz glass of hard liquor (44 mL). Lifestyle  Take daily care of your teeth and gums.  Stay active. Exercise for at least 30 minutes on 5 or more days each week.  Do not use any products that contain nicotine or tobacco, such as cigarettes, e-cigarettes, and chewing tobacco. If you need help quitting, ask your health care provider.  If you are sexually active, practice safe sex. Use a condom or other form of birth control (contraception) in order to prevent pregnancy and STIs (sexually transmitted infections).  If told by your health care provider, take low-dose aspirin daily starting at age 37. What's next?  Visit your health care provider once a year for a well check visit.  Ask your health care provider how often you should have your eyes and teeth checked.  Stay up to date on all vaccines. This information is not intended to replace advice given to you by your health care provider. Make sure you  discuss any questions you have with your health care provider. Document Revised: 04/30/2018 Document Reviewed: 04/30/2018 Elsevier Patient Education  2020 [employer].

## 2020-07-26 LAB — COMPREHENSIVE METABOLIC PANEL
ALT: 9 IU/L (ref 0–32)
AST: 14 IU/L (ref 0–40)
Albumin/Globulin Ratio: 2 (ref 1.2–2.2)
Albumin: 4.5 g/dL (ref 3.8–4.9)
Alkaline Phosphatase: 92 IU/L (ref 44–121)
BUN/Creatinine Ratio: 17 (ref 9–23)
BUN: 18 mg/dL (ref 6–24)
Bilirubin Total: 0.5 mg/dL (ref 0.0–1.2)
CO2: 21 mmol/L (ref 20–29)
Calcium: 9.7 mg/dL (ref 8.7–10.2)
Chloride: 100 mmol/L (ref 96–106)
Creatinine, Ser: 1.03 mg/dL — ABNORMAL HIGH (ref 0.57–1.00)
GFR calc Af Amer: 69 mL/min/{1.73_m2} (ref >59)
GFR calc non Af Amer: 60 mL/min/{1.73_m2} (ref >59)
Globulin, Total: 2.2 g/dL (ref 1.5–4.5)
Glucose: 69 mg/dL (ref 65–99)
Potassium: 4.2 mmol/L (ref 3.5–5.2)
Sodium: 137 mmol/L (ref 134–144)
Total Protein: 6.7 g/dL (ref 6.0–8.5)

## 2020-07-26 LAB — CBC WITH DIFFERENTIAL/PLATELET
Basophils Absolute: 0.1 10*3/uL (ref 0.0–0.2)
Basos: 1 %
EOS (ABSOLUTE): 0.2 10*3/uL (ref 0.0–0.4)
Eos: 3 %
Hematocrit: 36.2 % (ref 34.0–46.6)
Hemoglobin: 11.3 g/dL (ref 11.1–15.9)
Immature Grans (Abs): 0 10*3/uL (ref 0.0–0.1)
Immature Granulocytes: 0 %
Lymphocytes Absolute: 1.5 10*3/uL (ref 0.7–3.1)
Lymphs: 25 %
MCH: 25.7 pg — ABNORMAL LOW (ref 26.6–33.0)
MCHC: 31.2 g/dL — ABNORMAL LOW (ref 31.5–35.7)
MCV: 83 fL (ref 79–97)
Monocytes Absolute: 0.4 10*3/uL (ref 0.1–0.9)
Monocytes: 6 %
Neutrophils Absolute: 3.7 10*3/uL (ref 1.4–7.0)
Neutrophils: 65 %
Platelets: 323 10*3/uL (ref 150–450)
RBC: 4.39 x10E6/uL (ref 3.77–5.28)
RDW: 13.9 % (ref 11.7–15.4)
WBC: 5.8 10*3/uL (ref 3.4–10.8)

## 2020-07-26 LAB — TSH: TSH: 0.554 u[IU]/mL (ref 0.450–4.500)

## 2020-07-26 LAB — LDL CHOLESTEROL, DIRECT: LDL Direct: 91 mg/dL (ref 0–99)

## 2020-07-26 LAB — HEMOGLOBIN A1C
Est. average glucose Bld gHb Est-mCnc: 105 mg/dL
Hgb A1c MFr Bld: 5.3 % (ref 4.8–5.6)

## 2020-08-07 ENCOUNTER — Other Ambulatory Visit: Payer: Self-pay | Admitting: Physical Medicine and Rehabilitation

## 2020-08-07 ENCOUNTER — Encounter: Payer: Self-pay | Admitting: Physician Assistant

## 2020-08-07 ENCOUNTER — Other Ambulatory Visit: Payer: Self-pay | Admitting: Physician Assistant

## 2020-08-07 MED ORDER — ESZOPICLONE 1 MG PO TABS: 1.0000 mg | ORAL_TABLET | Freq: Every evening | ORAL | 0 refills | Status: DC | PRN

## 2020-08-07 NOTE — Telephone Encounter (Signed)
Patient last seen 07/25/2020 and advised to follow up in 3-4 months.   Last refill of Lunesta given by Opalski on 10/15/2019.   Please approve if deemed appropriate.

## 2020-08-08 ENCOUNTER — Encounter: Payer: Self-pay | Admitting: Physician Assistant

## 2020-08-08 MED ORDER — ESZOPICLONE 1 MG PO TABS: 1.0000 mg | ORAL_TABLET | Freq: Every evening | ORAL | 0 refills | Status: DC | PRN

## 2020-08-08 NOTE — Telephone Encounter (Signed)
I called to [facility] where the original refill was sent to verify that patient had not picked medication up. Patient had not. Refill was canceled at that pharmacy.   Please sent medication refill to [facility] on [address] per patient request due to cost. AS, CMA

## 2020-08-31 ENCOUNTER — Other Ambulatory Visit: Payer: Self-pay | Admitting: Physical Medicine & Rehabilitation

## 2020-09-12 ENCOUNTER — Other Ambulatory Visit: Payer: Self-pay | Admitting: Physical Medicine & Rehabilitation

## 2020-09-23 DIAGNOSIS — H179 Unspecified corneal scar and opacity: Secondary | ICD-10-CM | POA: Insufficient documentation

## 2020-10-19 LAB — HM MAMMOGRAPHY

## 2020-10-24 ENCOUNTER — Encounter: Payer: Self-pay | Admitting: Anesthesiology

## 2020-10-24 NOTE — Telephone Encounter (Signed)
It was not normal. Calcifications requiring additional views.

## 2020-10-31 ENCOUNTER — Encounter: Payer: Self-pay | Admitting: Physician Assistant

## 2020-11-13 ENCOUNTER — Encounter: Payer: Self-pay | Admitting: Anesthesiology

## 2020-11-14 HISTORY — PX: CORNEAL TRANSPLANT: SHX108

## 2020-11-20 ENCOUNTER — Other Ambulatory Visit: Payer: Self-pay | Admitting: Physical Medicine & Rehabilitation

## 2020-11-20 ENCOUNTER — Other Ambulatory Visit: Payer: Self-pay | Admitting: Physician Assistant

## 2020-11-22 ENCOUNTER — Encounter: Payer: Self-pay | Admitting: Physician Assistant

## 2020-11-22 ENCOUNTER — Ambulatory Visit (INDEPENDENT_AMBULATORY_CARE_PROVIDER_SITE_OTHER): Payer: BC Managed Care – PPO | Admitting: Physician Assistant

## 2020-11-22 ENCOUNTER — Other Ambulatory Visit: Payer: Self-pay

## 2020-11-22 VITALS — BP 143/81 | HR 61 | Temp 97.5°F | Ht 66.0 in | Wt 117.3 lb

## 2020-11-22 DIAGNOSIS — I10 Essential (primary) hypertension: Secondary | ICD-10-CM | POA: Diagnosis not present

## 2020-11-22 DIAGNOSIS — G4700 Insomnia, unspecified: Secondary | ICD-10-CM

## 2020-11-22 DIAGNOSIS — D333 Benign neoplasm of cranial nerves: Secondary | ICD-10-CM

## 2020-11-22 NOTE — Patient Instructions (Signed)

## 2020-11-22 NOTE — Progress Notes (Signed)
Established Patient Office Visit  Subjective:  Patient ID: Cristina Conner, female    DOB: [birth year]  Age: 60 y.o. MRN: [id number]  CC:  Chief Complaint  Patient presents with  . Follow-up  . Hypertension    HPI Cristina Conner presents for follow up on hypertension and insomnia. Patient is s/p keratoplasty 11/14/20 and recovering well. Has already noticed an improvement with vision. Reports decreased appetite but has started drinking Premier protein drinks.   HTN: Pt denies chest pain, palpitations, dizziness or lower extremity. Taking medication as directed without side effects. Checks BP at home and readings range 120-130s/80s. Pt tries to stay hydrated. States she has struggled with her health the last couple of months she gets anxious and worried when going to doctor's visits which makes her blood pressure go up.  Insomnia: Reports takes Lunesta as needed for severe insomnia. States has taken Trazodone the last few weeks for sleep instead of Lunesta which helps with sleep but feels groggy the next morning. Feels like her brain rests with the Lunesta.   Past Medical History:  Diagnosis Date  . Anxiety   . Arthritis   . Diverticulosis   . GERD (gastroesophageal reflux disease)   . Hypertension   . Osteopenia   . Post-operative nausea and vomiting    vomiting after general anesthesia per pt.  . Pre-diabetes     Past Surgical History:  Procedure Laterality Date  . BRAIN SURGERY N/A    Phreesia 07/22/2020  . CESAREAN SECTION     x 2  . COLONOSCOPY    . CRANIOTOMY Left 01/27/2020   Procedure: Left Craniotomy for Tumor Resection;  Surgeon: Judith Part, MD;  Location: [facility];  Service: Neurosurgery;  Laterality: Left;  . ENDOMETRIAL ABLATION    . ESOPHAGOGASTRODUODENOSCOPY  10/2019  . LAPAROSCOPY N/A 01/19/2020   Procedure: LAPAROSCOPY DIAGNOSTIC LAPAROTOMY WITH SMALL BOWEL RESECTION;  Surgeon: Ileana Roup, MD;  Location: [facility];  Service: General;   Laterality: N/A;  . PELVIC LAPAROSCOPY  1999   left salpingectomy, excision of Right, paratubal cyst  . PELVIC LAPAROSCOPY     laparoscopy with lysis of pelvic adhesions  . SHOULDER SURGERY  11/2010   "FROZEN SHOULDER"  . TOTAL HIP ARTHROPLASTY Left 08/17/2019   Procedure: LEFT TOTAL HIP ARTHROPLASTY ANTERIOR APPROACH;  Surgeon: Melrose Nakayama, MD;  Location: [facility];  Service: Orthopedics;  Laterality: Left;  . TUBAL LIGATION    . UPPER GASTROINTESTINAL ENDOSCOPY    . VENTRICULOSTOMY Left 01/27/2020   Procedure: VENTRICULOSTOMY;  Surgeon: Judith Part, MD;  Location: [facility];  Service: Neurosurgery;  Laterality: Left;    Family History  Problem Relation Age of Onset  . Hypertension Mother   . Heart disease Mother   . Diabetes Mother   . Hypertension Father   . Colon cancer Neg Hx   . Esophageal cancer Neg Hx   . Stomach cancer Neg Hx   . Rectal cancer Neg Hx   . Colon polyps Neg Hx     Social History   Socioeconomic History  . Marital status: Widowed    Spouse name: Not on file  . Number of children: Not on file  . Years of education: Not on file  . Highest education level: Not on file  Occupational History  . Not on file  Tobacco Use  . Smoking status: Never Smoker  . Smokeless tobacco: Never Used  Vaping Use  . Vaping Use: Never used  Substance and Sexual  Activity  . Alcohol use: Not Currently  . Drug use: No  . Sexual activity: Not Currently    Partners: Male    Birth control/protection: Post-menopausal    Comment: 1st intercourse- 58, partners- 27,  married- 79 yrs   Other Topics Concern  . Not on file  Social History Narrative   Widowed in summer 2020   Former smoker no alcohol or drug use   Social Determinants of Radio broadcast assistant Strain: Not on file  Food Insecurity: Not on file  Transportation Needs: Not on file  Physical Activity: Not on file  Stress: Not on file  Social Connections: Not on file  Intimate Partner Violence: Not on  file    Outpatient Medications Prior to Visit  Medication Sig Dispense Refill  . acetaminophen (TYLENOL) 500 MG tablet Take 1-2 tablets (500-1,000 mg total) by mouth every 4 (four) hours as needed for moderate pain. 30 tablet 0  . [employer] STOOL SOFTENER 100 MG capsule TAKE 1 CAPSULE BY MOUTH TWICE A DAY 60 capsule 1  . eszopiclone (LUNESTA) 1 MG TABS tablet Take 1 tablet (1 mg total) by mouth at bedtime as needed for sleep. Take immediately before bedtime 30 tablet 0  . moxifloxacin (VIGAMOX) 0.5 % ophthalmic solution Place 1 drop into the left eye 3 (three) times daily.    . prednisoLONE acetate (PRED FORTE) 1 % ophthalmic suspension Administer 1 drop into the left eye Every two (2) hours while awake.    . traMADol (ULTRAM) 50 MG tablet TAKE 1 TABLET BY MOUTH EVERY DAY AS NEEDED 30 tablet 1  . traZODone (DESYREL) 50 MG tablet Take 0.5-1 tablets (25-50 mg total) by mouth at bedtime as needed for sleep. 10 tablet 0  . metoprolol tartrate (LOPRESSOR) 25 MG tablet TAKE 1 TABLET BY MOUTH TWICE A DAY 180 tablet 0  . artificial tears (LACRILUBE) OINT ophthalmic ointment Place into both eyes at bedtime. 7 g 1  . Polyethyl Glycol-Propyl Glycol (SYSTANE) 0.4-0.3 % GEL ophthalmic gel Apply to eye as needed.     No facility-administered medications prior to visit.    Allergies  Allergen Reactions  . Hydrocodone Nausea And Vomiting  . Other     ROS Review of Systems Review of Systems:  A fourteen system review of systems was performed and found to be positive as per HPI.  Objective:    Physical Exam General:  Pleasant and cooperative, in no acute distress, wearing left eye patch  Neuro:  Alert and oriented,  extra-ocular muscles intact  HEENT:  Normocephalic, atraumatic, neck supple Skin:  no gross rash, warm, pink. Cardiac:  RRR, S1 S2 wnl's Respiratory:  ECTA B/L w/o wheezing, crackles or rales, Not using accessory muscles, speaking in full sentences- unlabored. Vascular:  Ext warm, no  cyanosis apprec.; no edema  Psych:  No HI/SI, judgement and insight good, Euthymic mood. Full Affect.   BP (!) 143/81 (BP Location: Left Arm, Patient Position: Sitting)   Pulse 61   Temp (!) 97.5 F (36.4 C)   Ht 5\' 6"  (1.676 m)   Wt 117 lb 4.8 oz (53.2 kg)   LMP 05/01/2009   SpO2 100%   BMI 18.93 kg/m  Wt Readings from Last 3 Encounters:  11/22/20 117 lb 4.8 oz (53.2 kg)  07/25/20 117 lb 11.2 oz (53.4 kg)  05/31/20 121 lb (54.9 kg)     Health Maintenance Due  Topic Date Due  . Hepatitis C Screening  Never done  . PAP  SMEAR-Modifier  08/01/2018    There are no preventive care reminders to display for this patient.  Lab Results  Component Value Date   TSH 0.554 07/25/2020   Lab Results  Component Value Date   WBC 5.8 07/25/2020   HGB 11.3 07/25/2020   HCT 36.2 07/25/2020   MCV 83 07/25/2020   PLT 323 07/25/2020   Lab Results  Component Value Date   NA 137 07/25/2020   K 4.2 07/25/2020   CO2 21 07/25/2020   GLUCOSE 69 07/25/2020   BUN 18 07/25/2020   CREATININE 1.03 (H) 07/25/2020   BILITOT 0.5 07/25/2020   ALKPHOS 92 07/25/2020   AST 14 07/25/2020   ALT 9 07/25/2020   PROT 6.7 07/25/2020   ALBUMIN 4.5 07/25/2020   CALCIUM 9.7 07/25/2020   ANIONGAP 9 02/07/2020   GFR 72.37 11/10/2019   Lab Results  Component Value Date   CHOL 141 01/23/2020   Lab Results  Component Value Date   HDL 34 (L) 01/23/2020   Lab Results  Component Value Date   LDLCALC 74 01/23/2020   Lab Results  Component Value Date   TRIG 164 (H) 01/23/2020   Lab Results  Component Value Date   CHOLHDL 4.1 01/23/2020   Lab Results  Component Value Date   HGBA1C 5.3 07/25/2020      Assessment & Plan:   Problem List Items Addressed This Visit      Cardiovascular and Mediastinum   Hypertension - Primary (Chronic)     Nervous and Auditory   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HCC)    Other Visit Diagnoses    Insomnia, unspecified type         Hypertension: -BP  elevated in office. Ambulatory readings are better controlled. Advised patient to check BP/pulse twice daily for 2 weeks and keep a log to forward to the office. If BP readings consistently >140/90 recommend treatment adjustments by adding CCB such as amlodipine 2.5 mg. Patient verbalized understanding. -Continue metoprolol 25 mg BID. -Recommend to stay well hydrated.  Insomnia, unspecified type: -Discussed with patient Lunesta and Trazodone can be taken for insomnia and do not recommend taking two sleep aids. If Trazodone helps with sleep then can continue use and d/c Lunesta. Patient prefers to continue with Lunesta at this time due to less side effects but hopes to d/c use in the near future as she continues to recover from vestibular schwannoma complications. -Recommend to avoid taking naps during the day. -Will continue to monitor.  Unilateral vestibular schwannoma: -Followed by Ophthalmology and Pain Management (Dr. Naaman Plummer). -S/p left retrosigmoid craniotomy and keratoplasty.   No orders of the defined types were placed in this encounter.   Follow-up: Return in about 4 months (around 03/24/2021) for HTN, insomnia .    Lorrene Reid, PA-C

## 2020-11-24 IMAGING — MR MR HEAD WO/W CM
9 of 11 series · 34 of 48 positions shown · IV contrast (gadavist)
Comparison: None.

CLINICAL DATA: Benign neoplasm of the brain/CNS. Craniotomy for
tumor resection.

EXAM:
MRI HEAD WITHOUT AND WITH CONTRAST
TECHNIQUE: Multiplanar, multiecho pulse sequences of the brain and surrounding
structures were obtained without and with intravenous contrast.
CONTRAST:  5mL GADAVIST GADOBUTROL 1 MMOL/ML IV SOLN

[Series 3: DWI · axial · 3.0mm · 1.09mm/px · z∈[-74,+76]mm · 8 of 102 slices shown (1 of 4)]
[im 1/102]
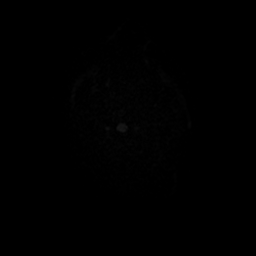
[im 12/102]
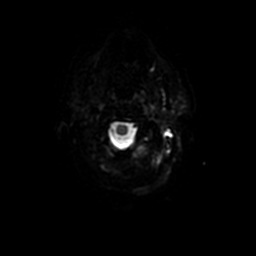
[im 34/102]
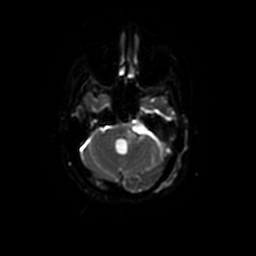
[im 45/102]
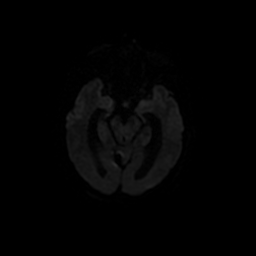
[im 57/102]
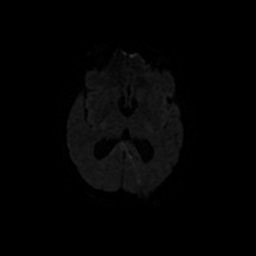
[im 68/102]
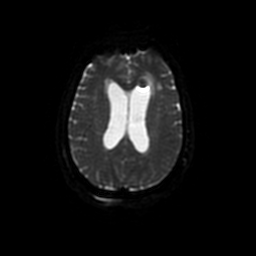
[im 90/102]
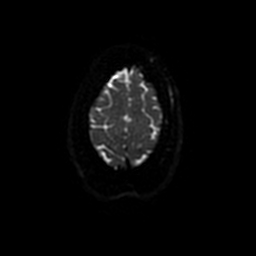
[im 102/102]
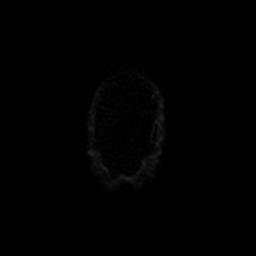

[Series 4: DWI · coronal · 5.0mm · 1.09mm/px · 7 of 72 slices shown (2 of 4)]
[im 1/72]
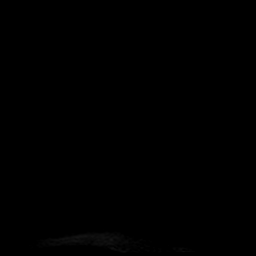
[im 12/72]
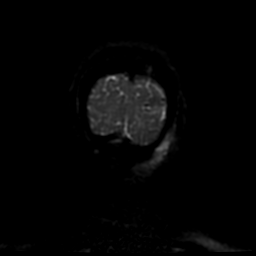
[im 24/72]
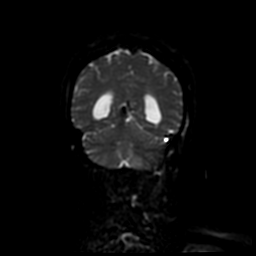
[im 36/72]
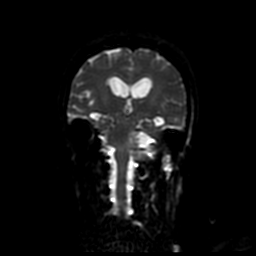
[im 48/72]
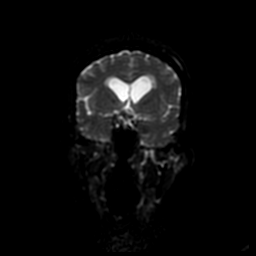
[im 60/72]
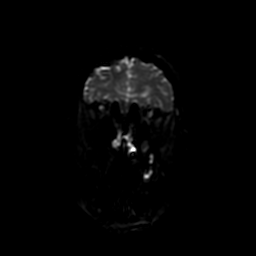
[im 72/72]
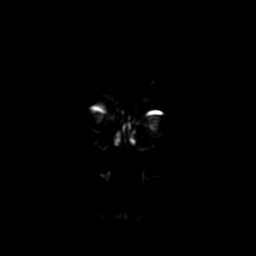

[Series 5: T1 · sagittal · 5.0mm · 0.47mm/px · 1 of 26 slices shown]
[im 1/26]
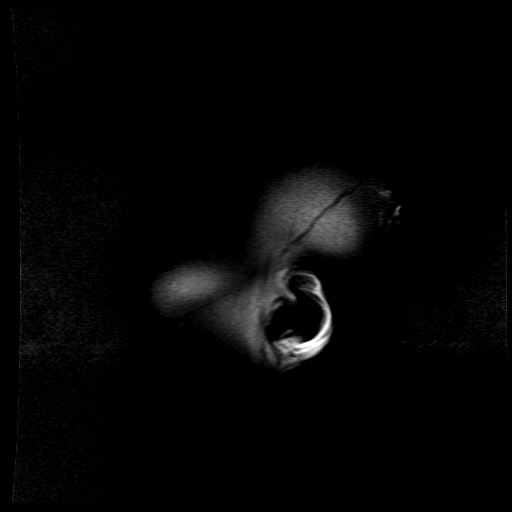

[Series 6: T2 · axial · 5.0mm · 0.43mm/px · z∈[-76,+73]mm · 2 of 26 slices shown]
[im 1/26]
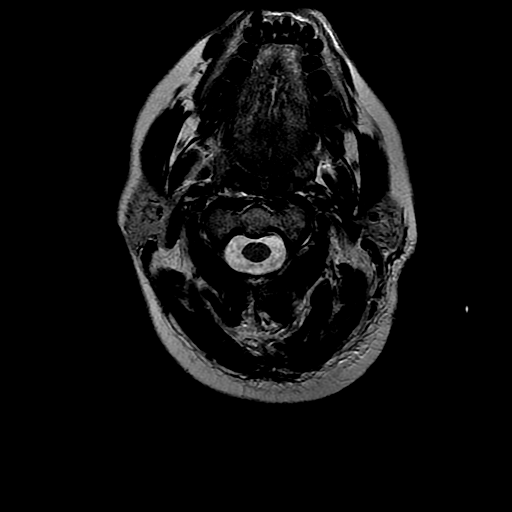
[im 26/26]
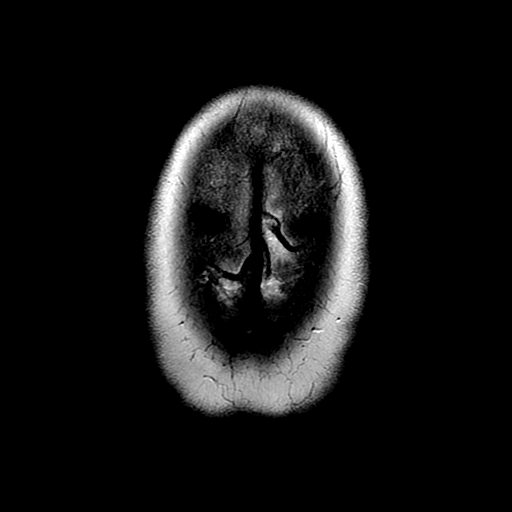

[Series 7: FLAIR · axial · 3.0mm · 0.43mm/px · z∈[-76,+73]mm · 2 of 26 slices shown]
[im 1/26]
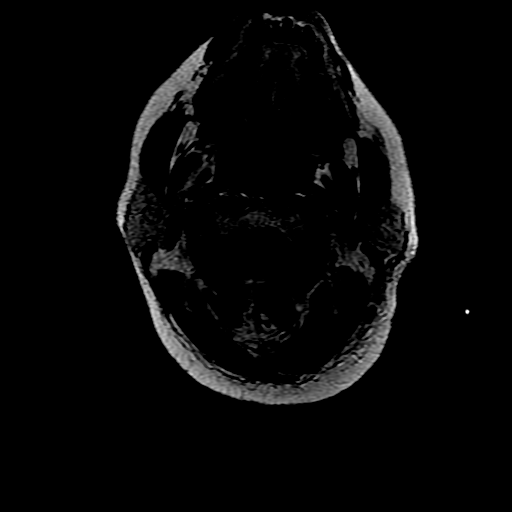
[im 26/26]
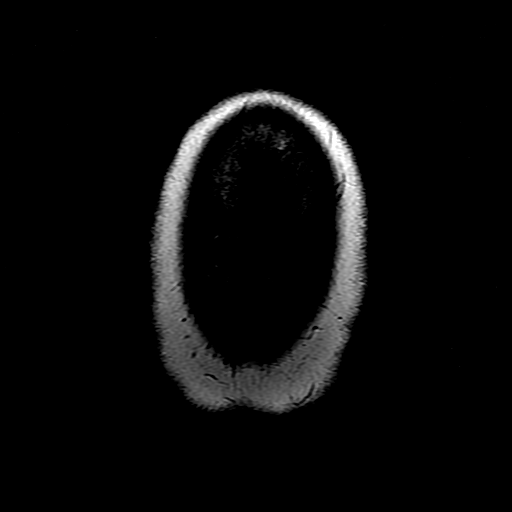

[Series 10: T2 post-contrast · coronal · 5.0mm · 0.39mm/px · 2 of 25 slices shown]
[im 1/25]
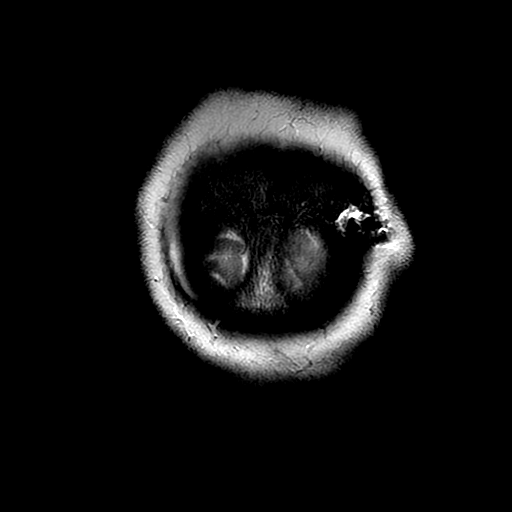
[im 25/25]
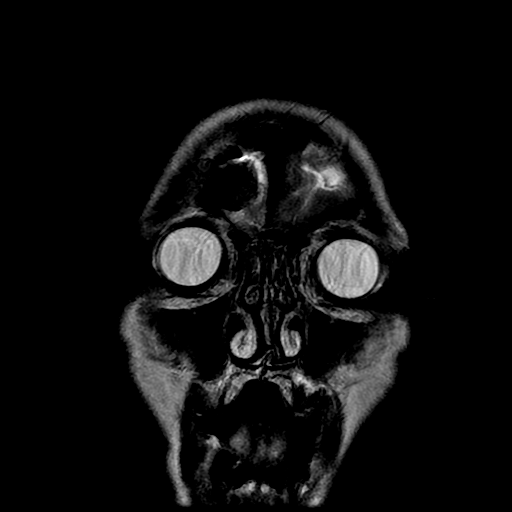

[Series 11: T1 post-contrast · axial · 3.0mm · 0.47mm/px · z∈[-68,+79]mm · 4 of 50 slices shown]
[im 1/50]
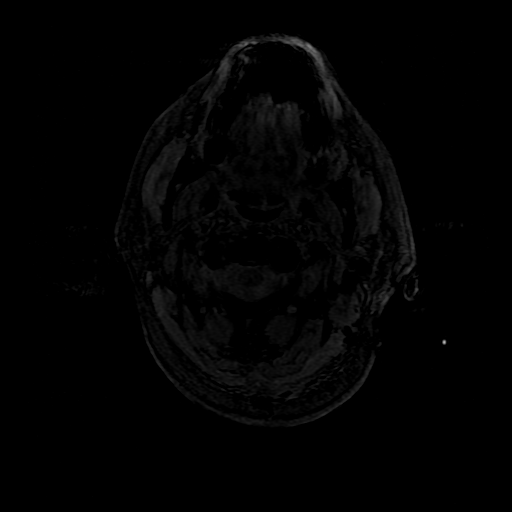
[im 17/50]
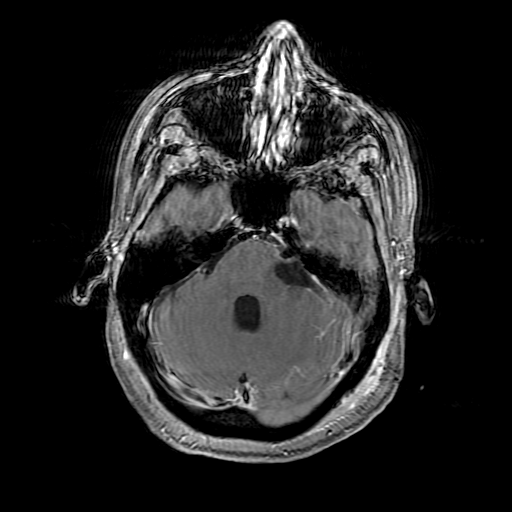
[im 33/50]
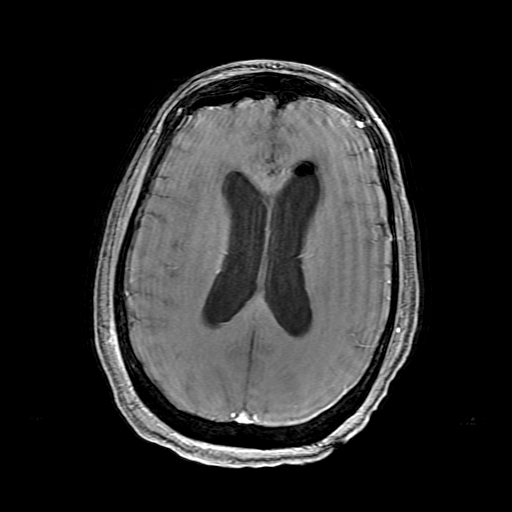
[im 50/50]
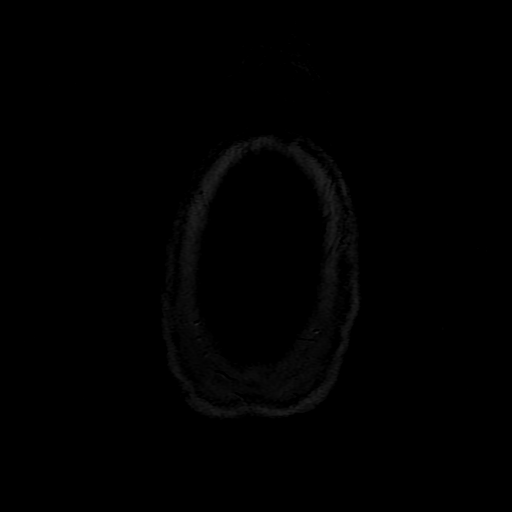

[Series 300: DWI · axial · 3.0mm · 1.09mm/px · z∈[-74,+76]mm · 5 of 51 slices shown (3 of 4)]
[im 1/51]
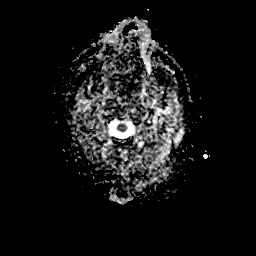
[im 13/51]
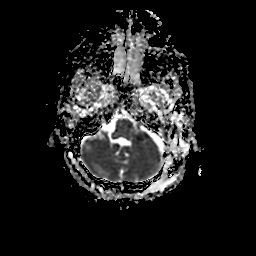
[im 26/51]
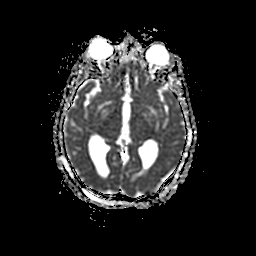
[im 38/51]
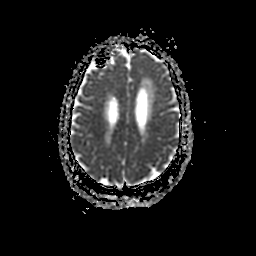
[im 51/51]
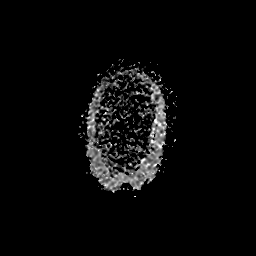

[Series 400: DWI · coronal · 5.0mm · 1.09mm/px · 3 of 35 slices shown (4 of 4)]
[im 1/35]
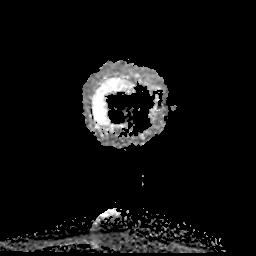
[im 18/35]
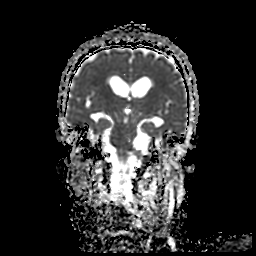
[im 35/35]
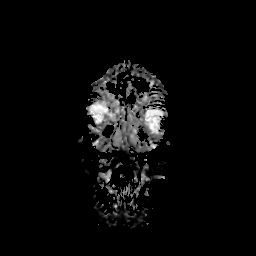

[34 of 48 positions shown; findings below may reference images not displayed]

FINDINGS: Brain: Status post left retrosigmoid craniotomy for resection of a
left cerebellopontine angle mass. Widening of the left
cerebellopontine angle cistern is noted with small pneumocephalus.
Surgical cavity involving left middle cerebellar peduncle and
lateral aspect of the pons with surrounding cytotoxic edema
extending inferiorly to the medulla, corresponding to postsurgical
infarct. Small focus of cytotoxic edema is also noted in the
inferior left cerebellar hemisphere. Residual tumor is noted within
the left internal auditory canal with no evidence of residual tumor
within the cerebellopontine angle cistern.

Persistent dilatation of the supratentorial ventricles seen with
associated T2 hyperintensity of the periventricular white matter.

External ventricular drain is noted through the left occipital lobe
with the tip terminating in the quadrigeminal cistern. Note that the
drain does not appear to communicate with the ventricular system.

Vascular: Normal flow voids.

Skull and upper cervical spine: Normal marrow signal.

Sinuses/Orbits: Mucous retention cyst in left maxillary sinus.

Other: Left mastoid effusion.
IMPRESSION: 1. Postsurgical changes from resection of a left cerebellopontine
angle mass with small residual tumor within the left internal
auditory canal. Cytotoxic edema noted at the margins of the surgical
cavity in the left middle cerebellar peduncle, lateral aspect of the
pons and medulla.
2. External ventricular drain along the left occipital lobe
terminating in the quadrigeminal cistern without effectively
communicating with the ventricular system.

These results will be called to the ordering clinician or
representative by the Radiologist Assistant, and communication
documented in the PACS or [REDACTED].

## 2020-11-27 ENCOUNTER — Ambulatory Visit: Payer: BC Managed Care – PPO | Admitting: Obstetrics & Gynecology

## 2020-11-27 ENCOUNTER — Other Ambulatory Visit: Payer: Self-pay

## 2020-11-27 ENCOUNTER — Ambulatory Visit (INDEPENDENT_AMBULATORY_CARE_PROVIDER_SITE_OTHER): Payer: BC Managed Care – PPO | Admitting: Physician Assistant

## 2020-11-27 ENCOUNTER — Telehealth: Payer: Self-pay | Admitting: *Deleted

## 2020-11-27 ENCOUNTER — Encounter: Payer: Self-pay | Admitting: Obstetrics & Gynecology

## 2020-11-27 VITALS — BP 152/94 | Ht 65.25 in | Wt 114.0 lb

## 2020-11-27 VITALS — BP 157/83 | HR 69 | Temp 98.3°F | Ht 66.0 in | Wt 117.0 lb

## 2020-11-27 DIAGNOSIS — Z01419 Encounter for gynecological examination (general) (routine) without abnormal findings: Secondary | ICD-10-CM

## 2020-11-27 DIAGNOSIS — R921 Mammographic calcification found on diagnostic imaging of breast: Secondary | ICD-10-CM

## 2020-11-27 DIAGNOSIS — M8589 Other specified disorders of bone density and structure, multiple sites: Secondary | ICD-10-CM

## 2020-11-27 DIAGNOSIS — Z23 Encounter for immunization: Secondary | ICD-10-CM

## 2020-11-27 DIAGNOSIS — Z780 Asymptomatic menopausal state: Secondary | ICD-10-CM | POA: Diagnosis not present

## 2020-11-27 NOTE — Progress Notes (Signed)
Patient tolerated vaccine well. VIS given. AS, CMA 

## 2020-11-27 NOTE — Progress Notes (Signed)
Cristina Conner [birth year] [id number]   History:    60 y.o. G2P2L2 Widowed x 03/2019.  GU:[id number] presenting for annual gyn exam   HYW:[id number], well on no HRT. No PMB. No pelvic pain. Abstinence. Urine/BMs wnl. Breasts wnl.  Lt Dx mammo 0.3 mm Ca++, Lt Breast Bx scheduled 4/6th. BMI 18.83.  Healthy nutrition. Health labs with Fam MD. Colono normal 2014/Sigmoidoscopy 01/2020.  Brain tumor surgery/Cx Facial nerve damage, had Corneal transplant left eye.   Past medical history,surgical history, family history and social history were all reviewed and documented in the EPIC chart.  Gynecologic History Patient's last menstrual period was 05/01/2009.  Obstetric History OB History  Gravida Para Term Preterm AB Living  2 2       2   SAB IAB Ectopic Multiple Live Births               # Outcome Date GA Lbr Len/2nd Weight Sex Delivery Anes PTL Lv  2 Para           1 Para              ROS: A ROS was performed and pertinent positives and negatives are included in the history.  GENERAL: No fevers or chills. HEENT: No change in vision, no earache, sore throat or sinus congestion. NECK: No pain or stiffness. CARDIOVASCULAR: No chest pain or pressure. No palpitations. PULMONARY: No shortness of breath, cough or wheeze. GASTROINTESTINAL: No abdominal pain, nausea, vomiting or diarrhea, melena or bright red blood per rectum. GENITOURINARY: No urinary frequency, urgency, hesitancy or dysuria. MUSCULOSKELETAL: No joint or muscle pain, no back pain, no recent trauma. DERMATOLOGIC: No rash, no itching, no lesions. ENDOCRINE: No polyuria, polydipsia, no heat or cold intolerance. No recent change in weight. HEMATOLOGICAL: No anemia or easy bruising or bleeding. NEUROLOGIC: No headache, seizures, numbness, tingling or weakness. PSYCHIATRIC: No depression, no loss of interest in normal activity or change in sleep pattern.     Exam:   BP (!) 152/94   Ht 5' 5.25" (1.657 m)    Wt 114 lb (51.7 kg)   LMP 05/01/2009   BMI 18.83 kg/m   Body mass index is 18.83 kg/m.  General appearance : Well developed well nourished female. No acute distress HEENT: Eyes: no retinal hemorrhage or exudates,  Neck supple, trachea midline, no carotid bruits, no thyroidmegaly Lungs: Clear to auscultation, no rhonchi or wheezes, or rib retractions  Heart: Regular rate and rhythm, no murmurs or gallops Breast:Examined in sitting and supine position were symmetrical in appearance, no palpable masses or tenderness,  no skin retraction, no nipple inversion, no nipple discharge, no skin discoloration, no axillary or supraclavicular lymphadenopathy Abdomen: no palpable masses or tenderness, no rebound or guarding Extremities: no edema or skin discoloration or tenderness  Pelvic: Vulva: Normal             Vagina: No gross lesions or discharge  Cervix: No gross lesions or discharge.  Pap reflex done  Uterus  AV, normal size, shape and consistency, non-tender and mobile  Adnexa  Without masses or tenderness  Anus: Normal   Assessment/Plan:  60 y.o. female for annual exam   1. Well female exam with routine gynecological exam Normal gynecologic exam in menopause.  Pap reflex done.  Breast exam normal.  Left breast calcifications on left diagnostic mammogram, left breast biopsy schedule December 06, 2020. Colonoscopy 2014/sigmoidoscopy May 2021.  Health labs with family physician.  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 - Pap IG w/ reflex to HPV when ASC-U  2. Postmenopausal Postmenopausal, well on no hormone replacement therapy.  No postmenopausal bleeding.  3. Breast calcification, left Left breast calcification, left breast biopsy schedule December 06, 2020.  4. Osteopenia of multiple sites Bone density in 2017 showed osteopenia with a T score of -2.0 at the left femoral neck.  Recommend a repeat bone density now.  Vitamin D  supplements, calcium intake of 1.5 g/day total and regular weightbearing physical activity is recommended. - DG Bone Density; Future  Princess Bruins MD, 9:10 AM 11/27/2020

## 2020-11-27 NOTE — Telephone Encounter (Signed)
-----   Message from Princess Bruins, MD sent at 11/27/2020 10:30 AM EDT ----- Regarding: Schedule Bone Density Overdue for BD, please schedule. Please inform patient that after her visit, I reviewed her chart and realized that she needs a BD.  Last BD here 2017 Osteopenia -2.0 Lt femur neck.

## 2020-11-27 NOTE — Telephone Encounter (Signed)
I called and informed patient Dr.Lavoie wanted her to repeat dexa this year. Order placed and patient would like appointments to call and schedule.

## 2020-11-28 LAB — PAP IG W/ RFLX HPV ASCU

## 2020-11-29 ENCOUNTER — Other Ambulatory Visit: Payer: Self-pay

## 2020-11-29 ENCOUNTER — Encounter: Payer: Self-pay | Admitting: Physical Medicine & Rehabilitation

## 2020-11-29 ENCOUNTER — Encounter
Payer: BC Managed Care – PPO | Attending: Physical Medicine & Rehabilitation | Admitting: Physical Medicine & Rehabilitation

## 2020-11-29 VITALS — BP 115/77 | HR 95 | Temp 99.1°F | Ht 66.0 in | Wt 116.8 lb

## 2020-11-29 DIAGNOSIS — G510 Bell's palsy: Secondary | ICD-10-CM

## 2020-11-29 DIAGNOSIS — D333 Benign neoplasm of cranial nerves: Secondary | ICD-10-CM | POA: Diagnosis present

## 2020-11-29 MED ORDER — CYCLOBENZAPRINE HCL 5 MG PO TABS: 5.0000 mg | ORAL_TABLET | Freq: Three times a day (TID) | ORAL | 1 refills | Status: DC | PRN

## 2020-11-29 NOTE — Progress Notes (Signed)
Subjective:    Patient ID: Cristina Conner, female    DOB: 27-Oct-1960, 60 y.o.   MRN: [id number]  HPI   Cristina Conner is here in follow up of her vestibular schwannoma and associated deficits. She is still having pain in her left fronto-temporal area which she characterizes as pressure. She also has numbness on her left face which can be uncomfortable. The pain and spasms tend to be worst in the evenings when she's fatigued.  She uses tramadol once per day on avg. She asked if she could get hooked on tramadol and ultimately would like to be off the medication.  She had corneal replacement 2 weeks ago and is doing well. She is not yet driving.   Pain Inventory Average Pain 6 Pain Right Now 4 My pain is intermittent, sharp, dull and aching  In the last 24 hours, has pain interfered with the following? General activity 4 Relation with others 2 Enjoyment of life 2 What TIME of day is your pain at its worst? daytime Sleep (in general) Good  Pain is worse with: later on in the day Pain improves with: medication Relief from Meds: 8  Family History  Problem Relation Age of Onset  . Hypertension Mother   . Heart disease Mother   . Diabetes Mother   . Hypertension Father   . Colon cancer Neg Hx   . Esophageal cancer Neg Hx   . Stomach cancer Neg Hx   . Rectal cancer Neg Hx   . Colon polyps Neg Hx    Social History   Socioeconomic History  . Marital status: Widowed    Spouse name: Not on file  . Number of children: Not on file  . Years of education: Not on file  . Highest education level: Not on file  Occupational History  . Not on file  Tobacco Use  . Smoking status: Never Smoker  . Smokeless tobacco: Never Used  Vaping Use  . Vaping Use: Never used  Substance and Sexual Activity  . Alcohol use: Not Currently  . Drug use: No  . Sexual activity: Not Currently    Partners: Male    Birth control/protection: Post-menopausal    Comment: 1st intercourse- 68, partners- 74,   married- 16 yrs   Other Topics Concern  . Not on file  Social History Narrative   Widowed in summer 2020   Former smoker no alcohol or drug use   Social Determinants of Radio broadcast assistant Strain: Not on file  Food Insecurity: Not on file  Transportation Needs: Not on file  Physical Activity: Not on file  Stress: Not on file  Social Connections: Not on file   Past Surgical History:  Procedure Laterality Date  . BRAIN SURGERY N/A    Phreesia 07/22/2020  . CESAREAN SECTION     x 2  . COLONOSCOPY    . 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 CRANIOTOMY Left 01/27/2020   Procedure: Left Craniotomy for Tumor Resection;  Surgeon: Judith Part, MD;  Location: [facility];  Service: Neurosurgery;  Laterality: Left;  . ENDOMETRIAL ABLATION    . ESOPHAGOGASTRODUODENOSCOPY  10/2019  . LAPAROSCOPY N/A 01/19/2020   Procedure: LAPAROSCOPY DIAGNOSTIC LAPAROTOMY WITH SMALL BOWEL RESECTION;  Surgeon: Ileana Roup, MD;  [facility];  Service: General;  Laterality: N/A;  . PELVIC LAPAROSCOPY  1999   left salpingectomy, excision of Right, paratubal cyst  . PELVIC LAPAROSCOPY     laparoscopy  with lysis of pelvic adhesions  . SHOULDER SURGERY  11/2010   "FROZEN SHOULDER"  . TOTAL HIP ARTHROPLASTY Left 08/17/2019   Procedure: LEFT TOTAL HIP ARTHROPLASTY ANTERIOR APPROACH;  Surgeon: Melrose Nakayama, MD;  Location[facility];  Service: Orthopedics;  Laterality: Left;  . TUBAL LIGATION    . UPPER GASTROINTESTINAL ENDOSCOPY    . VENTRICULOSTOMY Left 01/27/2020   Procedure: VENTRICULOSTOMY;  Surgeon: Judith Part, MD;  Location: [facility];  Service: Neurosurgery;  Laterality: Left;   Past Surgical History:  Procedure Laterality Date  . BRAIN SURGERY N/A    Phreesia 07/22/2020  . CESAREAN SECTION     x 2  . COLONOSCOPY    . 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 CRANIOTOMY Left 01/27/2020   Procedure: Left Craniotomy for Tumor Resection;   Surgeon: Judith Part, MD;  Location: [facility];  Service: Neurosurgery;  Laterality: Left;  . ENDOMETRIAL ABLATION    . ESOPHAGOGASTRODUODENOSCOPY  10/2019  . LAPAROSCOPY N/A 01/19/2020   Procedure: LAPAROSCOPY DIAGNOSTIC LAPAROTOMY WITH SMALL BOWEL RESECTION;  Surgeon: Ileana Roup, MD;  [facility];  Service: General;  Laterality: N/A;  . PELVIC LAPAROSCOPY  1999   left salpingectomy, excision of Right, paratubal cyst  . PELVIC LAPAROSCOPY     laparoscopy with lysis of pelvic adhesions  . SHOULDER SURGERY  11/2010   "FROZEN SHOULDER"  . TOTAL HIP ARTHROPLASTY Left 08/17/2019   Procedure: LEFT TOTAL HIP ARTHROPLASTY ANTERIOR APPROACH;  Surgeon: Melrose Nakayama, MD;  [facility];  Service: Orthopedics;  Laterality: Left;  . TUBAL LIGATION    . UPPER GASTROINTESTINAL ENDOSCOPY    . VENTRICULOSTOMY Left 01/27/2020   Procedure: VENTRICULOSTOMY;  Surgeon: Judith Part, MD;  Location: [facility];  Service: Neurosurgery;  Laterality: Left;   Past Medical History:  Diagnosis Date  . Anxiety   . Arthritis   . Diverticulosis   . GERD (gastroesophageal reflux disease)   . Hypertension   . Osteopenia   . Post-operative nausea and vomiting    vomiting after general anesthesia per pt.  . Pre-diabetes    BP 115/77   Pulse 95   Temp 99.1 F (37.3 C)   Ht 5\' 6"  (1.676 m)   Wt 116 lb 12.8 oz (53 kg)   LMP 05/01/2009   SpO2 100%   BMI 18.85 kg/m   Opioid Risk Score:   Fall Risk Score:  `1  Depression screen PHQ 2/9  Depression screen [facility] 2/9 11/29/2020 11/22/2020 07/25/2020 03/01/2020 12/07/2019 09/20/2019 09/07/2019  Decreased Interest 0 (No Data) 0 1 3 1 1   Down, Depressed, Hopeless 0 0 0 0 3 2 0  PHQ - 2 Score 0 0 0 1 6 3 1   Altered sleeping - 1 2 1 2 1  0  Tired, decreased energy - 1 0 1 3 2 3   Change in appetite - 1 1 0 2 2 1   Feeling bad or failure about yourself  - 0 0 0 2 0 0  Trouble concentrating - 0 0 0 2 0 0  Moving slowly or fidgety/restless - 1 0 1 2 3  0   Suicidal thoughts - 0 0 0 1 0 0  PHQ-9 Score - 4 3 4 20 11 5   Difficult doing work/chores - - Somewhat difficult Somewhat difficult Somewhat difficult Very difficult Not difficult at all  Some recent data might be hidden   Review of Systems  Neurological: Positive for light-headedness and headaches.  All other  systems reviewed and are negative.      Objective:   Physical Exam  General: No acute distress HEENT: EOMI, oral membranes moist. Left sclera, white, no drainage Cards: reg rate  Chest: normal effort Abdomen: Soft, NT, ND Skin: dry, intact Extremities: no edema Psych: pleasant and appropriate Neuro: left facial weakness, lid lag. Balance and gait much improved. Romberg negative. cognitively she showed reasonable insight and awareness concentration and short-term memory.  Strength is grossly 4+ to 5 out of 5 in all 4 limbs..  Musculoskeletal: No focal deficits.  Normal range of motion              Assessment & Plan:  1.Impaired function due to vestibular schwannoma with impaired balance, gait and L hearing loss and dysphagia, and mild/moderate cognitive issues              -HEP              2.  Pain: Really intermittent at best.  Continue Tylenol as needed             -can continue tramadol  -flexeril 5mg  prn for spasms  -consider gabapentin 4. Left VII nerve injury: Continue eye drops 5 X day and patch at night/when asleep. May use systane             -s/p corneal replacement and surgery 5. ABLA: may use iron/B complex with mulit-vitamin to decrease some of the pills that she is taking daily 11. HTN:            well controlled 13. Left hip pain s/p THR 12/20:              -stable 7. Vestibular dysfunction: improving.    15 minutes of face to face patient care time were spent during this visit. All questions were encouraged and answered.  Follow up with me in 3 mos .

## 2020-11-29 NOTE — Patient Instructions (Signed)
PLEASE FEEL FREE TO CALL OUR OFFICE WITH ANY PROBLEMS OR QUESTIONS ([phone])      

## 2020-11-30 IMAGING — CR DG HIP (WITH OR WITHOUT PELVIS) 2-3V*L*
3 series · 3 of 3 positions shown · non-contrast
Comparison: 08/17/2019

CLINICAL DATA: Left hip pain for 5 months

EXAM:
DG HIP (WITH OR WITHOUT PELVIS) 2-3V LEFT

[pelvis ap]
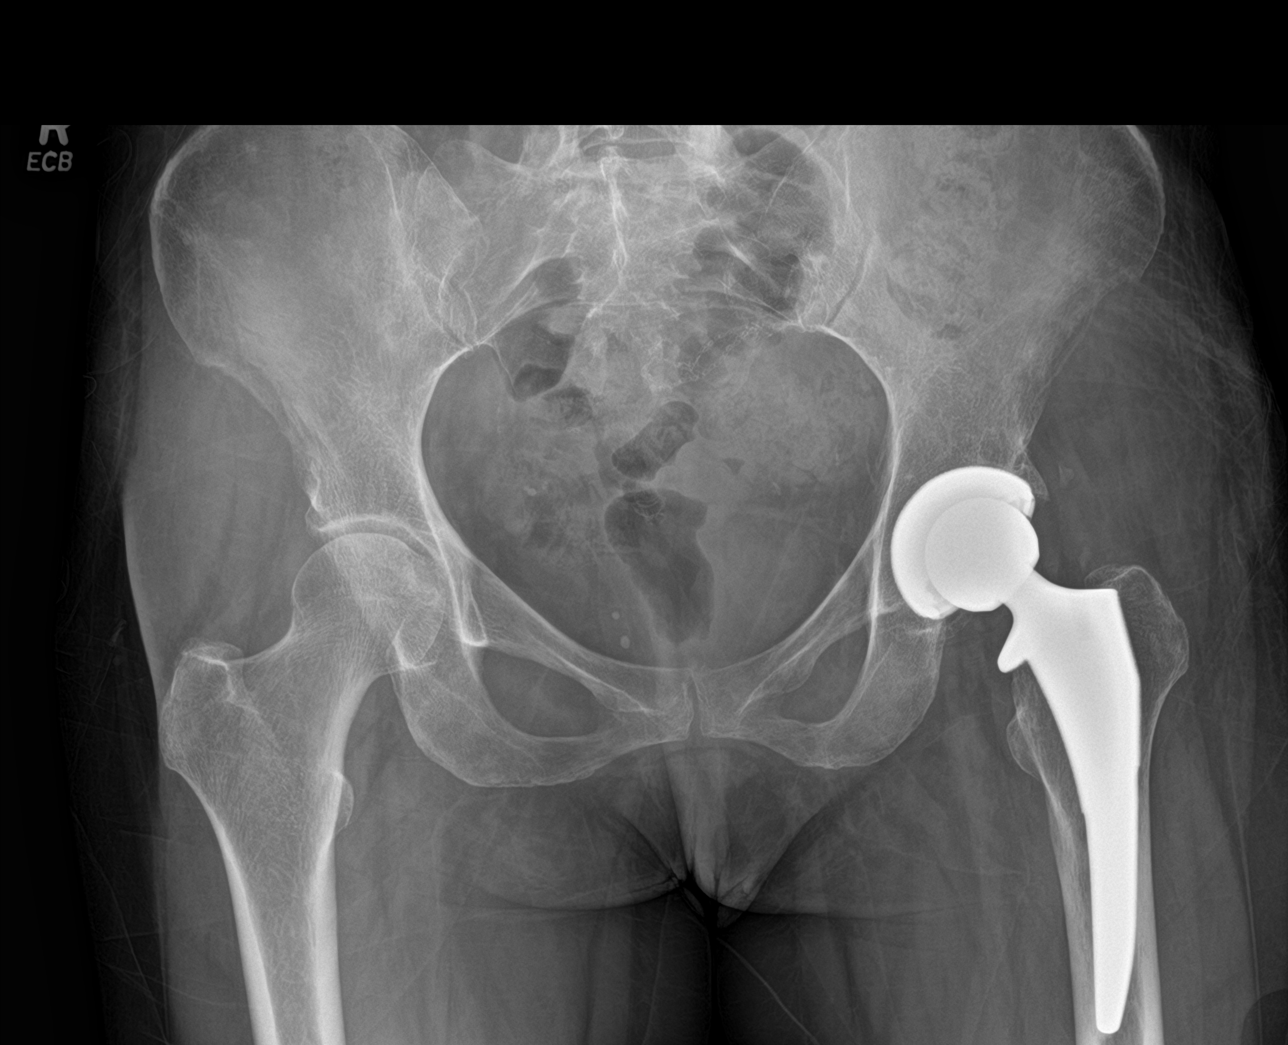

[hip ap]
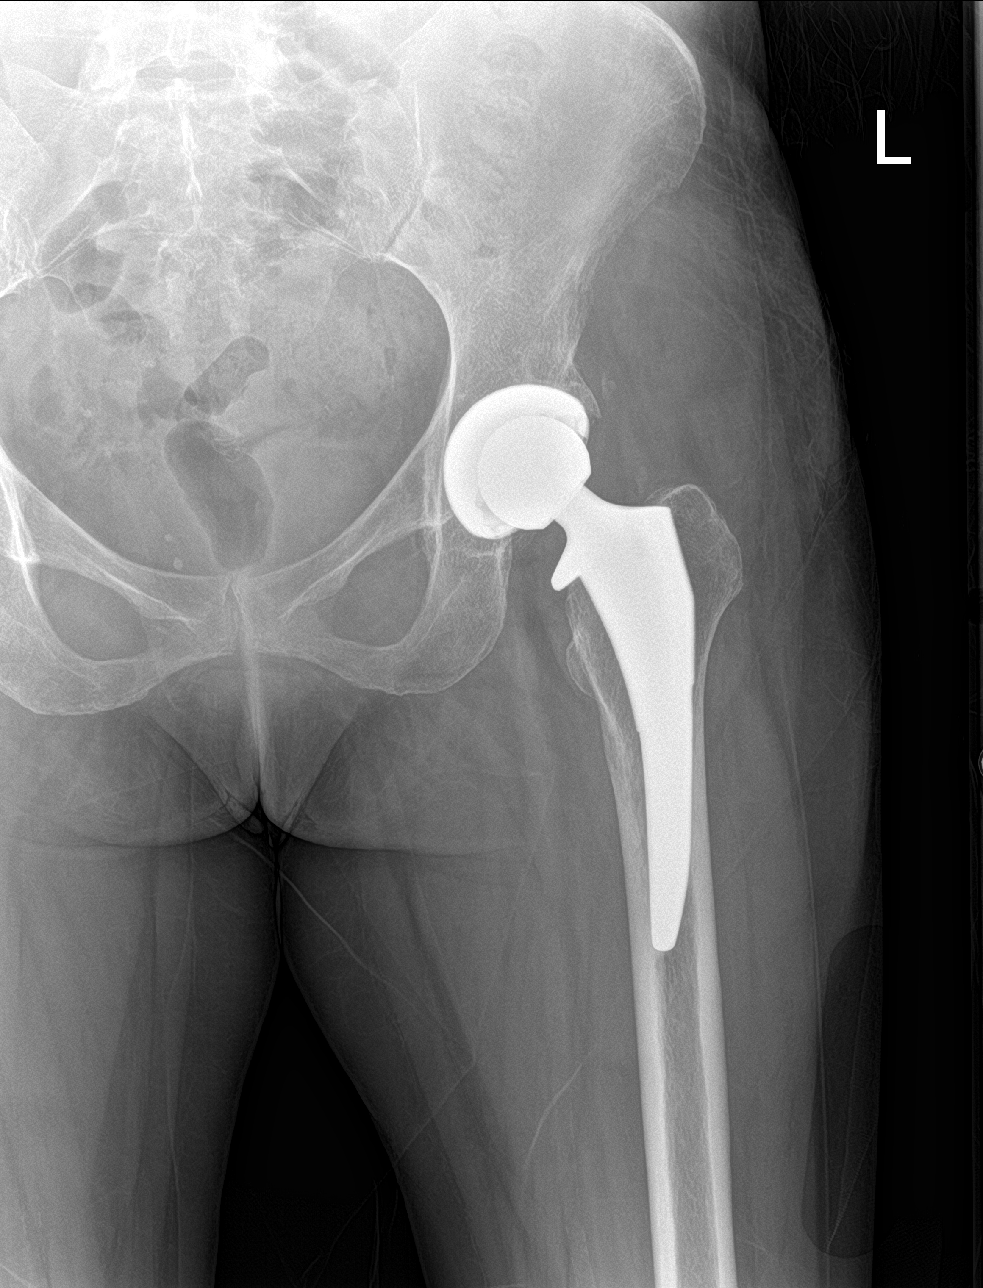

[hip lat]
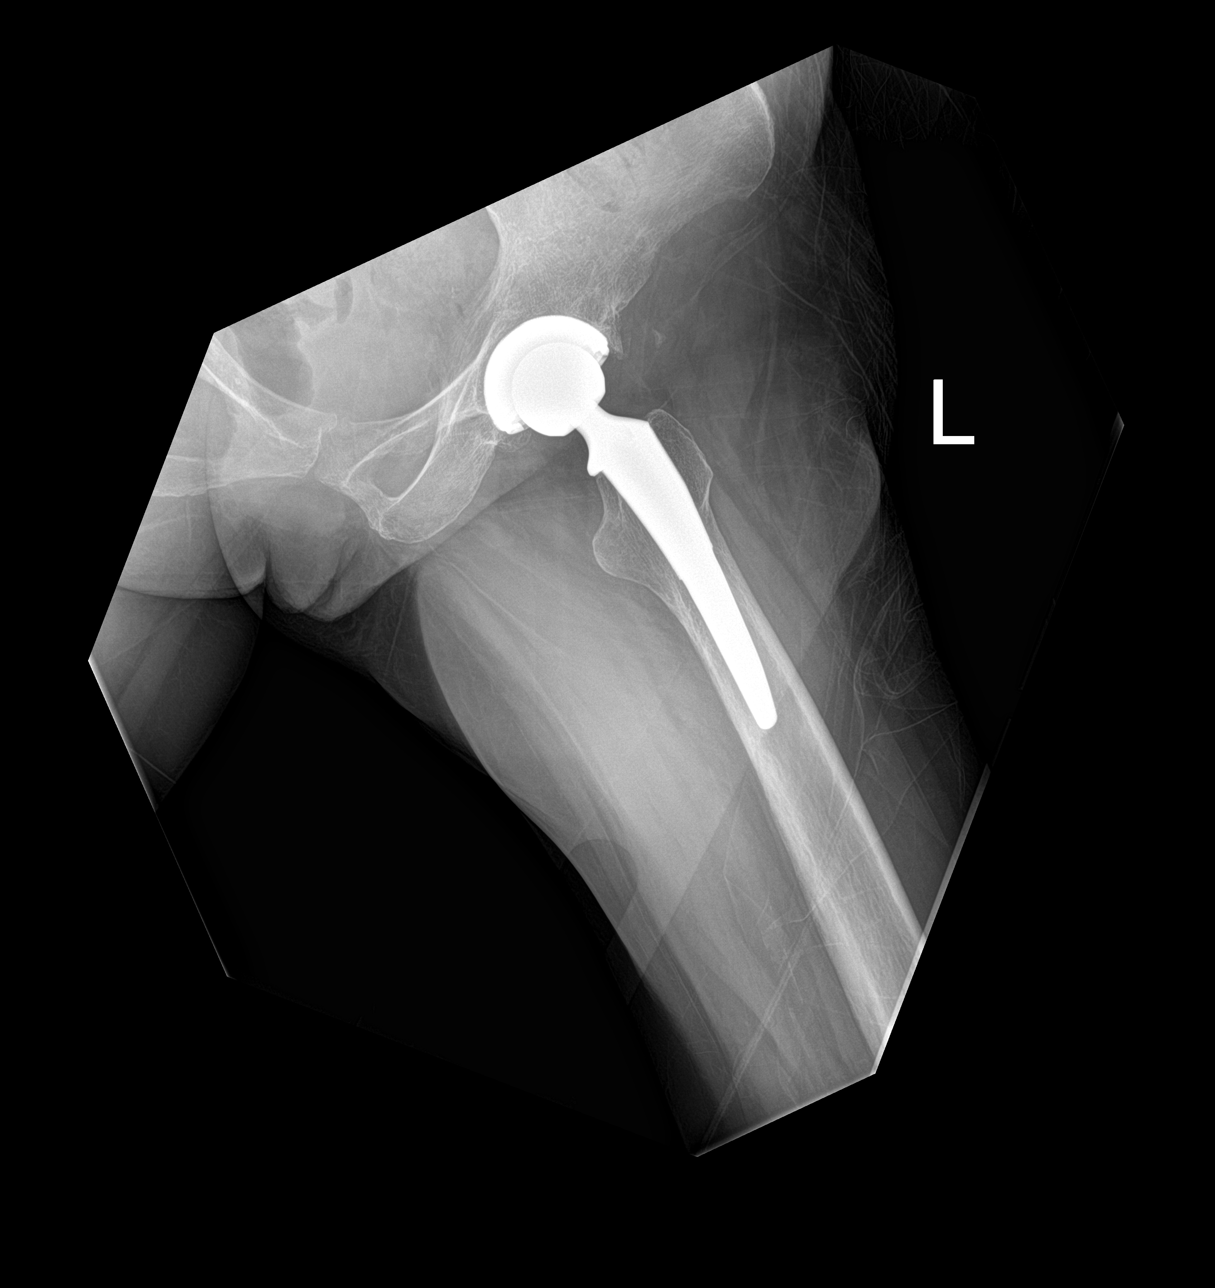

[3 of 3 positions shown; findings below may reference images not displayed]

FINDINGS: Frontal view of the pelvis as well as frontal and frogleg lateral
views of the left hip are obtained. The left hip arthroplasty is in
stable position with no evidence of complication. There are no acute
displaced fractures. Right hip is unremarkable. Soft tissues are
normal.
IMPRESSION: 1. Unremarkable left hip arthroplasty.
2. No acute bony abnormality.

## 2020-12-11 ENCOUNTER — Encounter: Payer: Self-pay | Admitting: Physician Assistant

## 2020-12-18 ENCOUNTER — Other Ambulatory Visit: Payer: Self-pay | Admitting: Physician Assistant

## 2020-12-20 NOTE — Telephone Encounter (Signed)
LM to schedule Dexa

## 2021-01-01 ENCOUNTER — Other Ambulatory Visit: Payer: Self-pay | Admitting: Physical Medicine & Rehabilitation

## 2021-01-12 ENCOUNTER — Other Ambulatory Visit: Payer: Self-pay | Admitting: Physical Medicine & Rehabilitation

## 2021-01-12 DIAGNOSIS — G510 Bell's palsy: Secondary | ICD-10-CM

## 2021-01-12 DIAGNOSIS — D333 Benign neoplasm of cranial nerves: Secondary | ICD-10-CM

## 2021-01-23 ENCOUNTER — Ambulatory Visit (INDEPENDENT_AMBULATORY_CARE_PROVIDER_SITE_OTHER): Payer: BC Managed Care – PPO

## 2021-01-23 ENCOUNTER — Other Ambulatory Visit: Payer: Self-pay | Admitting: Obstetrics & Gynecology

## 2021-01-23 ENCOUNTER — Other Ambulatory Visit: Payer: Self-pay

## 2021-01-23 DIAGNOSIS — M8589 Other specified disorders of bone density and structure, multiple sites: Secondary | ICD-10-CM

## 2021-01-23 DIAGNOSIS — Z780 Asymptomatic menopausal state: Secondary | ICD-10-CM

## 2021-01-23 DIAGNOSIS — M810 Age-related osteoporosis without current pathological fracture: Secondary | ICD-10-CM | POA: Diagnosis not present

## 2021-02-13 ENCOUNTER — Other Ambulatory Visit: Payer: Self-pay | Admitting: Physical Medicine & Rehabilitation

## 2021-02-13 DIAGNOSIS — D333 Benign neoplasm of cranial nerves: Secondary | ICD-10-CM

## 2021-02-13 DIAGNOSIS — G510 Bell's palsy: Secondary | ICD-10-CM

## 2021-02-28 ENCOUNTER — Other Ambulatory Visit: Payer: Self-pay

## 2021-02-28 ENCOUNTER — Encounter
Payer: BC Managed Care – PPO | Attending: Physical Medicine & Rehabilitation | Admitting: Physical Medicine & Rehabilitation

## 2021-02-28 ENCOUNTER — Encounter: Payer: Self-pay | Admitting: Physical Medicine & Rehabilitation

## 2021-02-28 VITALS — BP 145/90 | HR 79 | Temp 98.9°F | Ht 66.0 in | Wt 124.0 lb

## 2021-02-28 DIAGNOSIS — Z79891 Long term (current) use of opiate analgesic: Secondary | ICD-10-CM | POA: Insufficient documentation

## 2021-02-28 DIAGNOSIS — Z5181 Encounter for therapeutic drug level monitoring: Secondary | ICD-10-CM | POA: Diagnosis not present

## 2021-02-28 DIAGNOSIS — G510 Bell's palsy: Secondary | ICD-10-CM

## 2021-02-28 DIAGNOSIS — G894 Chronic pain syndrome: Secondary | ICD-10-CM

## 2021-02-28 DIAGNOSIS — D333 Benign neoplasm of cranial nerves: Secondary | ICD-10-CM | POA: Insufficient documentation

## 2021-02-28 NOTE — Patient Instructions (Signed)
PLEASE FEEL FREE TO CALL OUR OFFICE WITH ANY PROBLEMS OR QUESTIONS ([phone])      

## 2021-02-28 NOTE — Progress Notes (Signed)
Subjective:    Patient ID: Cristina Conner, female    DOB: 1960-11-23, 60 y.o.   MRN: [id number]  HPI  Cristina Conner is here in follow up of her vestibular schwannoma and associated CN7 palsy. Her vision is improving in her left eye although she has a cataract which will need to be removed. She is now driving. Her balance is improving. Flexeril has helped with her with her facial spasms/twitching. It even helps with her chewing. She is using 2 per day most days. She is using one tramadol typically at night which controls her facial and jaw pain.   She has been able to get out in the yard and planted some flowers.   Her bowels and bladder are moving regularly.   Pain Inventory Average Pain 4 Pain Right Now 0 My pain is sharp, dull, and stabbing  In the last 24 hours, has pain interfered with the following? General activity 1 Relation with others 0 Enjoyment of life 1 What TIME of day is your pain at its worst? evening Sleep (in general) NA  Pain is worse with: some activites Pain improves with: rest, pacing activities, and medication Relief from Meds: 7  Family History  Problem Relation Age of Onset   Hypertension Mother    Heart disease Mother    Diabetes Mother    Hypertension Father    Colon cancer Neg Hx    Esophageal cancer Neg Hx    Stomach cancer Neg Hx    Rectal cancer Neg Hx    Colon polyps Neg Hx    Social History   Socioeconomic History   Marital status: Widowed    Spouse name: Not on file   Number of children: Not on file   Years of education: Not on file   Highest education level: Not on file  Occupational History   Not on file  Tobacco Use   Smoking status: Never   Smokeless tobacco: Never  Vaping Use   Vaping Use: Never used  Substance and Sexual Activity   Alcohol use: Not Currently   Drug use: No   Sexual activity: Not Currently    Partners: Male    Birth control/protection: Post-menopausal    Comment: 1st intercourse- 33, partners- 76,   married- 59 yrs   Other Topics Concern   Not on file  Social History Narrative   Widowed in summer 2020   Former smoker no alcohol or drug use   Social Determinants of Radio broadcast assistant Strain: Not on file  Food Insecurity: Not on file  Transportation Needs: Not on file  Physical Activity: Not on file  Stress: Not on file  Social Connections: Not on file   Past Surgical History:  Procedure Laterality Date   BRAIN SURGERY N/A    Phreesia 07/22/2020   CESAREAN SECTION     x 2   COLONOSCOPY     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   at [place] Left 01/27/2020   Procedure: Left Craniotomy for Tumor Resection;  Surgeon: Judith Part, MD;  Location: [facility];  Service: Neurosurgery;  Laterality: Left;   ENDOMETRIAL ABLATION     ESOPHAGOGASTRODUODENOSCOPY  10/2019   LAPAROSCOPY N/A 01/19/2020   Procedure: LAPAROSCOPY DIAGNOSTIC LAPAROTOMY WITH SMALL BOWEL RESECTION;  Surgeon: Ileana Roup, MD;  [facility];  Service: General;  Laterality: N/A;   PELVIC LAPAROSCOPY  1999   left salpingectomy, excision of Right, paratubal cyst   PELVIC LAPAROSCOPY  laparoscopy with lysis of pelvic adhesions   SHOULDER SURGERY  11/2010   "FROZEN SHOULDER"   TOTAL HIP ARTHROPLASTY Left 08/17/2019   Procedure: LEFT TOTAL HIP ARTHROPLASTY ANTERIOR APPROACH;  Surgeon: Melrose Nakayama, MD;  [facility];  Service: Orthopedics;  Laterality: Left;   TUBAL LIGATION     UPPER GASTROINTESTINAL ENDOSCOPY     VENTRICULOSTOMY Left 01/27/2020   Procedure: VENTRICULOSTOMY;  Surgeon: Judith Part, MD;  Location: [facility];  Service: Neurosurgery;  Laterality: Left;   Past Surgical History:  Procedure Laterality Date   BRAIN SURGERY N/A    Phreesia 07/22/2020   CESAREAN SECTION     x 2   COLONOSCOPY     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   at [place] Left 01/27/2020   Procedure: Left Craniotomy for Tumor Resection;  Surgeon: Judith Part, MD;  Location: [facility];  Service: Neurosurgery;  Laterality: Left;   ENDOMETRIAL ABLATION     ESOPHAGOGASTRODUODENOSCOPY  10/2019   LAPAROSCOPY N/A 01/19/2020   Procedure: LAPAROSCOPY DIAGNOSTIC LAPAROTOMY WITH SMALL BOWEL RESECTION;  Surgeon: Ileana Roup, MD;  [facility];  Service: General;  Laterality: N/A;   PELVIC LAPAROSCOPY  1999   left salpingectomy, excision of Right, paratubal cyst   PELVIC LAPAROSCOPY     laparoscopy with lysis of pelvic adhesions   SHOULDER SURGERY  11/2010   "FROZEN SHOULDER"   TOTAL HIP ARTHROPLASTY Left 08/17/2019   Procedure: LEFT TOTAL HIP ARTHROPLASTY ANTERIOR APPROACH;  Surgeon: Melrose Nakayama, MD;  [facility];  Service: Orthopedics;  Laterality: Left;   TUBAL LIGATION     UPPER GASTROINTESTINAL ENDOSCOPY     VENTRICULOSTOMY Left 01/27/2020   Procedure: VENTRICULOSTOMY;  Surgeon: Judith Part, MD;  Location: [facility];  Service: Neurosurgery;  Laterality: Left;   Past Medical History:  Diagnosis Date   Anxiety    Arthritis    Diverticulosis    GERD (gastroesophageal reflux disease)    Hypertension    Osteopenia    Post-operative nausea and vomiting    vomiting after general anesthesia per pt.   Pre-diabetes    BP (!) 145/90   Pulse 79   Temp 98.9 F (37.2 C)   Ht 5\' 6"  (1.676 m)   Wt 124 lb (56.2 kg)   LMP 05/01/2009   SpO2 99%   BMI 20.01 kg/m   Opioid Risk Score:   Fall Risk Score:  `1  Depression screen PHQ 2/9  Depression screen [facility] 2/9 11/29/2020 11/22/2020 07/25/2020 03/01/2020 12/07/2019 09/20/2019 09/07/2019  Decreased Interest 0 (No Data) 0 1 3 1 1   Down, Depressed, Hopeless 0 0 0 0 3 2 0  PHQ - 2 Score 0 0 0 1 6 3 1   Altered sleeping - 1 2 1 2 1  0  Tired, decreased energy - 1 0 1 3 2 3   Change in appetite - 1 1 0 2 2 1   Feeling bad or failure about yourself  - 0 0 0 2 0 0  Trouble concentrating - 0 0 0 2 0 0  Moving slowly or fidgety/restless - 1 0 1 2 3  0  Suicidal thoughts - 0 0 0 1 0 0  PHQ-9 Score  - 4 3 4 20 11 5   Difficult doing work/chores - - Somewhat difficult Somewhat difficult Somewhat difficult Very difficult Not difficult at all  Some recent data might be hidden    Review of Systems  Constitutional: Negative.   HENT: Negative.  Eyes: Negative.   Respiratory: Negative.    Cardiovascular: Negative.   Gastrointestinal: Negative.   Endocrine: Negative.   Genitourinary: Negative.   Musculoskeletal: Negative.   Skin: Negative.   Allergic/Immunologic: Negative.   Neurological:  Positive for headaches.  Hematological: Negative.   Psychiatric/Behavioral: Negative.    All other systems reviewed and are negative.     Objective:   Physical Exam  General: No acute distress HEENT: EOMI, oral membranes moist, sclera white Cards: reg rate  Chest: normal effort Abdomen: Soft, NT, ND Skin: dry, intact Extremities: no edema Psych: pleasant and appropriate  Neuro: closes eye lid to 75-80%. Left facial droop, tongue deviation persists. Numbness along left hemi-face still present. Balance nearly normal. Tandem gait normal.  Strength is grossly 4+ to 5 out of 5 in all 4 limbs..  Mild left hearing loss still noted Musculoskeletal: No focal deficits.  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 Encouraged her to go visit friends, go to beach, etc 2.  Pain: generally much improved.             -May use tramadol for severe pain---this was RF last month  -continue flexeril 5mg  prn for spasms  -UDS today  We will continue the controlled substance monitoring program, this consists of regular clinic visits, examinations, routine drug screening, pill counts as well as use of [place] Controlled Substance Reporting System. [place].   3. Left VII nerve injury: lid weight,  closure improved.   -continue per ophtho. Local eye care  -outpt therapy as mentioned above potentially for facial weakness. Will d/w speech therapy at neuro rehab re: nerve stim. 4. ABLA: may use iron/B complex with mulit-vitamin to decrease some of the pills that she is taking daily 5. HTN:             borderline--follow up with PCP 6. Left hip pain s/p THR 12/20:              -stable 7. Vestibular dysfunction: improved   15 minutes of face to face patient care time were spent during this visit. All questions were encouraged and answered.  Follow up with me in 6 mos .

## 2021-03-03 ENCOUNTER — Other Ambulatory Visit: Payer: Self-pay | Admitting: Physical Medicine & Rehabilitation

## 2021-03-03 ENCOUNTER — Other Ambulatory Visit: Payer: Self-pay | Admitting: Physician Assistant

## 2021-03-08 LAB — TOXASSURE SELECT,+ANTIDEPR,UR

## 2021-03-09 ENCOUNTER — Telehealth: Payer: Self-pay | Admitting: *Deleted

## 2021-03-09 NOTE — Telephone Encounter (Signed)
Urine drug screen for this encounter is consistent for prescribed medication 

## 2021-03-20 ENCOUNTER — Other Ambulatory Visit: Payer: Self-pay

## 2021-03-20 ENCOUNTER — Other Ambulatory Visit: Payer: BC Managed Care – PPO

## 2021-03-20 DIAGNOSIS — Z0000 Encounter for general adult medical examination without abnormal findings: Secondary | ICD-10-CM

## 2021-03-21 ENCOUNTER — Other Ambulatory Visit: Payer: Self-pay | Admitting: Physical Medicine & Rehabilitation

## 2021-03-21 DIAGNOSIS — G510 Bell's palsy: Secondary | ICD-10-CM

## 2021-03-21 DIAGNOSIS — D333 Benign neoplasm of cranial nerves: Secondary | ICD-10-CM

## 2021-03-21 LAB — COMPREHENSIVE METABOLIC PANEL
ALT: 11 IU/L (ref 0–32)
AST: 17 IU/L (ref 0–40)
Albumin/Globulin Ratio: 1.8 (ref 1.2–2.2)
Albumin: 4.4 g/dL (ref 3.8–4.9)
Alkaline Phosphatase: 93 IU/L (ref 44–121)
BUN/Creatinine Ratio: 15 (ref 9–23)
BUN: 15 mg/dL (ref 6–24)
Bilirubin Total: 0.2 mg/dL (ref 0.0–1.2)
CO2: 25 mmol/L (ref 20–29)
Calcium: 9.3 mg/dL (ref 8.7–10.2)
Chloride: 102 mmol/L (ref 96–106)
Creatinine, Ser: 1.03 mg/dL — ABNORMAL HIGH (ref 0.57–1.00)
Globulin, Total: 2.5 g/dL (ref 1.5–4.5)
Glucose: 80 mg/dL (ref 65–99)
Potassium: 4.5 mmol/L (ref 3.5–5.2)
Sodium: 141 mmol/L (ref 134–144)
Total Protein: 6.9 g/dL (ref 6.0–8.5)
eGFR: 63 mL/min/{1.73_m2} (ref >59)

## 2021-03-21 LAB — CBC WITH DIFFERENTIAL/PLATELET
Basophils Absolute: 0 10*3/uL (ref 0.0–0.2)
Basos: 1 %
EOS (ABSOLUTE): 0.2 10*3/uL (ref 0.0–0.4)
Eos: 3 %
Hematocrit: 38.2 % (ref 34.0–46.6)
Hemoglobin: 12.6 g/dL (ref 11.1–15.9)
Immature Grans (Abs): 0 10*3/uL (ref 0.0–0.1)
Immature Granulocytes: 0 %
Lymphocytes Absolute: 1.6 10*3/uL (ref 0.7–3.1)
Lymphs: 27 %
MCH: 26.4 pg — ABNORMAL LOW (ref 26.6–33.0)
MCHC: 33 g/dL (ref 31.5–35.7)
MCV: 80 fL (ref 79–97)
Monocytes Absolute: 0.4 10*3/uL (ref 0.1–0.9)
Monocytes: 8 %
Neutrophils Absolute: 3.6 10*3/uL (ref 1.4–7.0)
Neutrophils: 61 %
Platelets: 308 10*3/uL (ref 150–450)
RBC: 4.77 x10E6/uL (ref 3.77–5.28)
RDW: 16.7 % — ABNORMAL HIGH (ref 11.7–15.4)
WBC: 5.8 10*3/uL (ref 3.4–10.8)

## 2021-03-21 LAB — HEMOGLOBIN A1C
Est. average glucose Bld gHb Est-mCnc: 111 mg/dL
Hgb A1c MFr Bld: 5.5 % (ref 4.8–5.6)

## 2021-03-21 LAB — LIPID PANEL
Chol/HDL Ratio: 2.6 ratio (ref 0.0–4.4)
Cholesterol, Total: 185 mg/dL (ref 100–199)
HDL: 72 mg/dL (ref >39)
LDL Chol Calc (NIH): 102 mg/dL — ABNORMAL HIGH (ref 0–99)
Triglycerides: 58 mg/dL (ref 0–149)
VLDL Cholesterol Cal: 11 mg/dL (ref 5–40)

## 2021-03-21 LAB — TSH: TSH: 1.07 u[IU]/mL (ref 0.450–4.500)

## 2021-03-23 ENCOUNTER — Encounter: Payer: Self-pay | Admitting: Physician Assistant

## 2021-03-23 ENCOUNTER — Other Ambulatory Visit: Payer: Self-pay

## 2021-03-23 ENCOUNTER — Ambulatory Visit (INDEPENDENT_AMBULATORY_CARE_PROVIDER_SITE_OTHER): Payer: BC Managed Care – PPO | Admitting: Physician Assistant

## 2021-03-23 VITALS — BP 132/80 | HR 94 | Temp 98.4°F | Ht 66.0 in | Wt 127.2 lb

## 2021-03-23 DIAGNOSIS — Z0000 Encounter for general adult medical examination without abnormal findings: Secondary | ICD-10-CM

## 2021-03-23 DIAGNOSIS — G4700 Insomnia, unspecified: Secondary | ICD-10-CM | POA: Diagnosis not present

## 2021-03-23 DIAGNOSIS — Z947 Corneal transplant status: Secondary | ICD-10-CM | POA: Diagnosis not present

## 2021-03-23 DIAGNOSIS — G510 Bell's palsy: Secondary | ICD-10-CM | POA: Diagnosis not present

## 2021-03-23 DIAGNOSIS — I10 Essential (primary) hypertension: Secondary | ICD-10-CM

## 2021-03-23 NOTE — Patient Instructions (Signed)
Heart-Healthy Eating Plan Heart-healthy meal planning includes: Eating less unhealthy fats. Eating more healthy fats. Making other changes in your diet. Talk with your doctor or a diet specialist (dietitian) to create an eating plan that is right for you. What is my plan? Your doctor may recommend an eating plan that includes: Total fat: ______% or less of total calories a day. Saturated fat: ______% or less of total calories a day. Cholesterol: less than _________mg a day. What are tips for following this plan? Cooking Avoid frying your food. Try to bake, boil, grill, or broil it instead. You can also reduce fat by: Removing the skin from poultry. Removing all visible fats from meats. Steaming vegetables in water or broth. Meal planning  At meals, divide your plate into four equal parts: Fill one-half of your plate with vegetables and green salads. Fill one-fourth of your plate with whole grains. Fill one-fourth of your plate with lean protein foods. Eat 4-5 servings of vegetables per day. A serving of vegetables is: 1 cup of raw or cooked vegetables. 2 cups of raw leafy greens. Eat 4-5 servings of fruit per day. A serving of fruit is: 1 medium whole fruit.  cup of dried fruit.  cup of fresh, frozen, or canned fruit.  cup of 100% fruit juice. Eat more foods that have soluble fiber. These are apples, broccoli, carrots, beans, peas, and barley. Try to get 20-30 g of fiber per day. Eat 4-5 servings of nuts, legumes, and seeds per week: 1 serving of dried beans or legumes equals  cup after being cooked. 1 serving of nuts is  cup. 1 serving of seeds equals 1 tablespoon.  General information Eat more home-cooked food. Eat less restaurant, buffet, and fast food. Limit or avoid alcohol. Limit foods that are high in starch and sugar. Avoid fried foods. Lose weight if you are overweight. Keep track of how much salt (sodium) you eat. This is important if you have high blood  pressure. Ask your doctor to tell you more about this. Try to add vegetarian meals each week. Fats Choose healthy fats. These include olive oil and canola oil, flaxseeds, walnuts, almonds, and seeds. Eat more omega-3 fats. These include salmon, mackerel, sardines, tuna, flaxseed oil, and ground flaxseeds. Try to eat fish at least 2 times each week. Check food labels. Avoid foods with trans fats or high amounts of saturated fat. Limit saturated fats. These are often found in animal products, such as meats, butter, and cream. These are also found in plant foods, such as palm oil, palm kernel oil, and coconut oil. Avoid foods with partially hydrogenated oils in them. These have trans fats. Examples are stick margarine, some tub margarines, cookies, crackers, and other baked goods. What foods can I eat? Fruits All fresh, canned (in natural juice), or frozen fruits. Vegetables Fresh or frozen vegetables (raw, steamed, roasted, or grilled). Green salads. Grains Most grains. Choose whole wheat and whole grains most of the time. Rice andpasta, including brown rice and pastas made with whole wheat. Meats and other proteins Lean, well-trimmed beef, veal, pork, and lamb. Chicken and turkey without skin. All fish and shellfish. Wild duck, rabbit, pheasant, and venison. Egg whites or low-cholesterol egg substitutes. Dried beans, peas, lentils, and tofu. Seedsand most nuts. Dairy Low-fat or nonfat cheeses, including ricotta and mozzarella. Skim or 1% milk that is liquid, powdered, or evaporated. Buttermilk that is made with low-fatmilk. Nonfat or low-fat yogurt. Fats and oils Non-hydrogenated (trans-free) margarines. Vegetable oils, including soybean, sesame,   sunflower, olive, peanut, safflower, corn, canola, and cottonseed. Salad dressings or mayonnaisemade with a vegetable oil. Beverages Mineral water. Coffee and tea. Diet carbonated beverages. Sweets and desserts Sherbet, gelatin, and fruit ice. Small  amounts of dark chocolate. Limit all sweets and desserts. Seasonings and condiments All seasonings and condiments. The items listed above may not be a complete list of foods and drinks you can eat. Contact a dietitian for more options. What foods should I avoid? Fruits Canned fruit in heavy syrup. Fruit in cream or butter sauce. Fried fruit. Limitcoconut. Vegetables Vegetables cooked in cheese, cream, or butter sauce. Fried vegetables. Grains Breads that are made with saturated or trans fats, oils, or whole milk. Croissants. Sweet rolls. Donuts. High-fat crackers,such as cheese crackers. Meats and other proteins Fatty meats, such as hot dogs, ribs, sausage, bacon, rib-eye roast or steak. High-fat deli meats, such as salami and bologna. Caviar. Domestic duck andgoose. Organ meats, such as liver. Dairy Cream, sour cream, cream cheese, and creamed cottage cheese. Whole-milk cheeses. Whole or 2% milk that is liquid, evaporated, or condensed. Whole buttermilk. Cream sauce or high-fat cheese sauce. Yogurt that is made fromwhole milk. Fats and oils Meat fat, or shortening. Cocoa butter, hydrogenated oils, palm oil, coconut oil, palm kernel oil. Solid fats and shortenings, including bacon fat, salt pork, lard, and butter. Nondairy cream substitutes. Salad dressings with cheeseor sour cream. Beverages Regular sodas and juice drinks with added sugar. Sweets and desserts Frosting. Pudding. Cookies. Cakes. Pies. Milk chocolate or white chocolate.Buttered syrups. Full-fat ice cream or ice cream drinks. The items listed above may not be a complete list of foods and drinks to avoid. Contact a dietitian for more information. Summary Heart-healthy meal planning includes eating less unhealthy fats, eating more healthy fats, and making other changes in your diet. Eat a balanced diet. This includes fruits and vegetables, low-fat or nonfat dairy, lean protein, nuts and legumes, whole grains, and heart-healthy  oils and fats. This information is not intended to replace advice given to you by your health care provider. Make sure you discuss any questions you have with your healthcare provider. Document Revised: 10/23/2017 Document Reviewed: 09/26/2017 Elsevier Patient Education  2022 [employer]  

## 2021-03-23 NOTE — Progress Notes (Signed)
Established Patient Office Visit  Subjective:  Patient ID: Cristina Conner, female    DOB: Jan 04, 1961  Age: 60 y.o. MRN: [id number]  CC:  Chief Complaint  Patient presents with   Follow-up    Insomnia    Hypertension    HPI Cristina Conner presents for follow up on insomnia and hypertension. Patient is s/p left eye cornea transplant and reports recovering well, her vision continues to improve. 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   Insomnia: Patient reports she is sleeping better. States every now and then takes trazodone and has not needed to take [facility] which she was taking if having a significantly difficult time falling asleep.   HTN: Pt denies chest pain, palpitations, dizziness or lower extremity swelling. Taking medication as directed without side effects. Checks BP at home and readings range 121-133/75-85. Pt follows a low salt diet.   Past Medical History:  Diagnosis Date   Anxiety    Arthritis    Diverticulosis    GERD (gastroesophageal reflux disease)    Hypertension    Osteopenia    Post-operative nausea and vomiting    vomiting after general anesthesia per pt.   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   at [place] Left 01/27/2020   Procedure: Left Craniotomy for Tumor Resection;  Surgeon: Judith Part, MD;  Location: [facility];  Service: Neurosurgery;  Laterality: Left;   ENDOMETRIAL ABLATION     ESOPHAGOGASTRODUODENOSCOPY  10/2019   LAPAROSCOPY N/A 01/19/2020   Procedure: LAPAROSCOPY DIAGNOSTIC LAPAROTOMY WITH SMALL BOWEL RESECTION;  Surgeon: Ileana Roup, MD;  Location: [facility];  Service: General;  Laterality: N/A;    PELVIC LAPAROSCOPY  1999   left salpingectomy, excision of Right, paratubal cyst   PELVIC LAPAROSCOPY     laparoscopy with lysis of pelvic adhesions   SHOULDER SURGERY  11/2010   "FROZEN SHOULDER"   TOTAL HIP ARTHROPLASTY Left 08/17/2019   Procedure: LEFT TOTAL HIP ARTHROPLASTY ANTERIOR APPROACH;  Surgeon: Melrose Nakayama, MD;  Location: [facility];  Service: Orthopedics;  Laterality: Left;   TUBAL LIGATION     UPPER GASTROINTESTINAL ENDOSCOPY     VENTRICULOSTOMY Left 01/27/2020   Procedure: VENTRICULOSTOMY;  Surgeon: Judith Part, MD;  Location: [facility];  Service: Neurosurgery;  Laterality: Left;    Family History  Problem Relation Age of Onset   Hypertension Mother    Heart disease Mother    Diabetes Mother    Hypertension Father    Colon cancer Neg Hx    Esophageal cancer Neg Hx    Stomach cancer Neg Hx    Rectal cancer Neg Hx    Colon polyps Neg Hx     Social History   Socioeconomic History   Marital status: Widowed    Spouse name: Not on file   Number of children: Not on file   Years of education: Not on file   Highest education level: Not on file  Occupational History   Not on file  Tobacco Use   Smoking status: Never   Smokeless tobacco: Never  Vaping Use   Vaping Use: Never used  Substance and  Sexual Activity   Alcohol use: Not Currently   Drug use: No   Sexual activity: Not Currently    Partners: Male    Birth control/protection: Post-menopausal    Comment: 1st intercourse- 31, partners- 2,  married- 81 yrs   Other Topics Concern   Not on file  Social History Narrative   Widowed in summer 2020   Former smoker no alcohol or drug use   Social Determinants of Radio broadcast assistant Strain: Not on file  Food Insecurity: Not on file  Transportation Needs: Not on file  Physical Activity: Not on file  Stress: Not on file  Social Connections: Not on file  Intimate Partner Violence: Not on file    Outpatient Medications Prior to Visit  Medication  Sig Dispense Refill   acetaminophen (TYLENOL) 500 MG tablet Take 1-2 tablets (500-1,000 mg total) by mouth every 4 (four) hours as needed for moderate pain. 30 tablet 0   calcium carbonate (CALTRATE 600) 1500 (600 Ca) MG TABS tablet Take by mouth 2 (two) times daily with a meal.     cholecalciferol (VITAMIN D3) 25 MCG (1000 UNIT) tablet Take 2,000 Units by mouth daily.     cyclobenzaprine (FLEXERIL) 5 MG tablet TAKE 1 TABLET BY MOUTH THREE TIMES A DAY AS NEEDED FOR MUSCLE SPASMS 30 tablet 1   docusate sodium (COLACE) 100 MG capsule TAKE 1 CAPSULE BY MOUTH TWICE A DAY 60 capsule 5   eszopiclone (LUNESTA) 1 MG TABS tablet Take 1 tablet (1 mg total) by mouth at bedtime as needed for sleep. Take immediately before bedtime 30 tablet 0   metoprolol tartrate (LOPRESSOR) 25 MG tablet TAKE 1 TABLET BY MOUTH TWICE A DAY 180 tablet 0   Multiple Vitamin (MULTIVITAMIN) tablet Take 1 tablet by mouth daily.     prednisoLONE acetate (PRED FORTE) 1 % ophthalmic suspension in the morning and at bedtime.     traMADol (ULTRAM) 50 MG tablet TAKE 1 TABLET BY MOUTH EVERY DAY AS NEEDED 30 tablet 1   traZODone (DESYREL) 50 MG tablet Take 0.5-1 tablets (25-50 mg total) by mouth at bedtime as needed for sleep. 10 tablet 0   calcium carbonate (OSCAL) 1500 (600 Ca) MG TABS tablet Take by mouth 2 (two) times daily with a meal.     No facility-administered medications prior to visit.    Allergies  Allergen Reactions   Hydrocodone Nausea And Vomiting   Other     ROS Review of Systems Review of Systems:  A fourteen system review of systems was performed and found to be positive as per HPI.   Objective:    Physical Exam General:  Well Developed, well nourished, in no acute distress  Neuro:  Alert and oriented x3, left sided facial nerve palsy  HEENT:  Normocephalic, atraumatic, neck supple Skin:  no gross rash, warm, pink. Cardiac:  RRR Respiratory:  ECTA B/L w/o wheezing, crackles or rales, Not using accessory  muscles, speaking in full sentences- unlabored. Vascular:  Ext warm, no cyanosis apprec.; cap RF less 2 sec. Psych:  No HI/SI, judgement and insight good, Euthymic mood. Full Affect.  BP 132/80   Pulse 94   Temp 98.4 F (36.9 C)   Ht _0  (1.676 m)   Wt 127 lb 3.2 oz (57.7 kg)   LMP 05/01/2009   SpO2 100%   BMI 20.53 kg/m  Wt Readings from Last 3 Encounters:  03/23/21 127 lb 3.2 oz (57.7 kg)  02/28/21 124 lb (56.2 kg)  11/29/20 116 lb 12.8 oz (53 kg)     Health Maintenance Due  Topic Date Due   Hepatitis C Screening  Never done   PAP SMEAR-Modifier  08/01/2018   COVID-19 Vaccine (4 - Booster for [employer] series) 10/20/2020    There are no preventive care reminders to display for this patient.  Lab Results  Component Value Date   TSH 1.070 03/20/2021   Lab Results  Component Value Date   WBC 5.8 03/20/2021   HGB 12.6 03/20/2021   HCT 38.2 03/20/2021   MCV 80 03/20/2021   PLT 308 03/20/2021   Lab Results  Component Value Date   NA 141 03/20/2021   K 4.5 03/20/2021   CO2 25 03/20/2021   GLUCOSE 80 03/20/2021   BUN 15 03/20/2021   CREATININE 1.03 (H) 03/20/2021   BILITOT 0.2 03/20/2021   ALKPHOS 93 03/20/2021   AST 17 03/20/2021   ALT 11 03/20/2021   PROT 6.9 03/20/2021   ALBUMIN 4.4 03/20/2021   CALCIUM 9.3 03/20/2021   ANIONGAP 9 02/07/2020   EGFR 63 03/20/2021   GFR 72.37 11/10/2019   Lab Results  Component Value Date   CHOL 185 03/20/2021   Lab Results  Component Value Date   HDL 72 03/20/2021   Lab Results  Component Value Date   LDLCALC 102 (H) 03/20/2021   Lab Results  Component Value Date   TRIG 58 03/20/2021   Lab Results  Component Value Date   CHOLHDL 2.6 03/20/2021   Lab Results  Component Value Date   HGBA1C 5.5 03/20/2021      Assessment & Plan:   Problem List Items Addressed This Visit       Cardiovascular and Mediastinum   Hypertension - Primary (Chronic)     Nervous and Auditory   Cranial nerve VII palsy    Other Visit Diagnoses     Insomnia, unspecified type       S/P PKP (penetrating keratoplasty)       Healthcare maintenance          Hypertension: -Controlled. -Continue current medication regimen. -Will continue to monitor.  Insomnia: -Improved. -Recommend to continue trazodone as needed for sleep. If sleep remains stable then recommend discontinuing Lunesta. -Will continue to monitor.  Cranial nerve VII palsy: -Discussed with patient indications for starting trental and recommend further research on other uses for trental such as for facial palsy, current data is limited and controversial.  -Encourage to continue with physiotherapy exercises.   S/P PKP: -Followed by [facility] opthalmology.   Healthcare Maintenance: -Discussed most recent labs which are essentially within normal limits or stable from prior. LDL mildly increased, advised to reduce dairy products. -Recommend to stay well hydrated. -Advised to schedule CPE.  No orders of the defined types were placed in this encounter.   Follow-up: Return in about 4 months (around 07/24/2021) for CPE.    Lorrene Reid, PA-C

## 2021-04-25 ENCOUNTER — Other Ambulatory Visit: Payer: Self-pay | Admitting: Physical Medicine & Rehabilitation

## 2021-05-09 ENCOUNTER — Other Ambulatory Visit: Payer: Self-pay | Admitting: Physical Medicine & Rehabilitation

## 2021-05-09 ENCOUNTER — Other Ambulatory Visit: Payer: Self-pay | Admitting: Physician Assistant

## 2021-05-09 DIAGNOSIS — D333 Benign neoplasm of cranial nerves: Secondary | ICD-10-CM

## 2021-05-09 DIAGNOSIS — G510 Bell's palsy: Secondary | ICD-10-CM

## 2021-07-11 ENCOUNTER — Other Ambulatory Visit: Payer: Self-pay | Admitting: Physical Medicine & Rehabilitation

## 2021-07-11 DIAGNOSIS — G510 Bell's palsy: Secondary | ICD-10-CM

## 2021-07-11 DIAGNOSIS — D333 Benign neoplasm of cranial nerves: Secondary | ICD-10-CM

## 2021-07-19 ENCOUNTER — Other Ambulatory Visit: Payer: Self-pay | Admitting: Physical Medicine & Rehabilitation

## 2021-07-20 ENCOUNTER — Ambulatory Visit (INDEPENDENT_AMBULATORY_CARE_PROVIDER_SITE_OTHER): Payer: BC Managed Care – PPO | Admitting: Physician Assistant

## 2021-07-20 ENCOUNTER — Encounter: Payer: Self-pay | Admitting: Physician Assistant

## 2021-07-20 ENCOUNTER — Other Ambulatory Visit: Payer: Self-pay

## 2021-07-20 VITALS — BP 130/79 | HR 80 | Temp 97.9°F | Ht 66.0 in | Wt 129.8 lb

## 2021-07-20 DIAGNOSIS — I10 Essential (primary) hypertension: Secondary | ICD-10-CM | POA: Diagnosis not present

## 2021-07-20 DIAGNOSIS — Z0000 Encounter for general adult medical examination without abnormal findings: Secondary | ICD-10-CM

## 2021-07-20 NOTE — Progress Notes (Signed)
Subjective:     Cristina Conner is a 60 y.o. female and is here for a comprehensive physical exam. The patient reports no problems.  Social History   Socioeconomic History   Marital status: Widowed    Spouse name: Not on file   Number of children: Not on file   Years of education: Not on file   Highest education level: Not on file  Occupational History   Not on file  Tobacco Use   Smoking status: Never   Smokeless tobacco: Never  Vaping Use   Vaping Use: Never used  Substance and Sexual Activity   Alcohol use: Not Currently   Drug use: No   Sexual activity: Not Currently    Partners: Male    Birth control/protection: Post-menopausal    Comment: 1st intercourse- 77, partners- 72,  married- 89 yrs   Other Topics Concern   Not on file  Social History Narrative   Widowed in summer 2020   Former smoker no alcohol or drug use   Social Determinants of Radio broadcast assistant Strain: Not on file  Food Insecurity: Not on file  Transportation Needs: Not on file  Physical Activity: Not on file  Stress: Not on file  Social Connections: Not on file  Intimate Partner Violence: Not on file   Health Maintenance  Topic Date Due   Hepatitis C Screening  Never done   PAP SMEAR-Modifier  08/01/2018   COVID-19 Vaccine (4 - Booster for [place] series) 08/14/2020   MAMMOGRAM  10/19/2021   TETANUS/TDAP  08/24/2022   COLONOSCOPY (Pts 45-67yrs Insurance coverage will need to be confirmed)  10/13/2022   INFLUENZA VACCINE  Completed   HIV Screening  Completed   Zoster Vaccines- Shingrix  Completed   Pneumococcal Vaccine 27-41 Years old  Aged Out   HPV VACCINES  Aged Out    The following portions of the patient's history were reviewed and updated as appropriate: allergies, current medications, past family history, past medical history, past social history, past surgical history, and problem list.  Review of Systems Pertinent items noted in HPI and remainder of comprehensive ROS  otherwise negative.   Objective:    BP 130/79   Pulse 80   Temp 97.9 F (36.6 C)   Ht 5\' 6"  (1.676 m)   Wt 129 lb 12.8 oz (58.9 kg)   LMP 05/01/2009   SpO2 100%   BMI 20.95 kg/m  General appearance: alert, cooperative, and no distress Head: Normocephalic, without obvious abnormality, atraumatic Eyes: negative findings: conjunctivae and sclerae normal and PERRL, left cornea implant Ears: normal TM's and external ear canals both ears Nose: Nares normal. Septum midline. Mucosa normal. No drainage or sinus tenderness. Throat: normal findings: lips normal without lesions and palpation of salivary glands negative and teeth fair Neck: no adenopathy, no JVD, supple, symmetrical, trachea midline, and thyroid: normal to inspection and palpation Back: symmetric, no curvature. ROM normal. No CVA tenderness. Lungs: clear to auscultation bilaterally Breasts:  Deferred to OB/GYN Heart: regular rate and rhythm and S1, S2 normal Abdomen: soft, non-tender; bowel sounds normal; no masses,  no organomegaly Pelvic: deferred Extremities: extremities normal, atraumatic, no cyanosis or edema Pulses: 2+ and symmetric Skin: Skin color, texture, turgor normal. No rashes or lesions Lymph nodes: Cervical adenopathy: normal and Supraclavicular adenopathy: normal Neurologic: Grossly normal , left facial paralysis/facial droop     Assessment:    Healthy female exam.     Plan:  -UTD mammogram, colonoscopy, Tdap, Shingrix, bone density.  Completed influenza vaccine at the pharmacy [facility]). -Will request pap results from [place]. -BP stable. -Recommend to continue with strengthening exercises and heart healthy diet. -Follow up in 4 months for HTN, HLD and FBW (cmp, lipid panel) few days prior.   See After Visit Summary for Counseling Recommendations

## 2021-07-20 NOTE — Patient Instructions (Signed)
Preventive Care 92-59 Years Old, Female Preventive care refers to lifestyle choices and visits with your health care provider that can promote health and wellness. Preventive care visits are also called wellness exams. What can I expect for my preventive care visit? Counseling Your health care provider may ask you questions about your: Medical history, including: Past medical problems. Family medical history. Pregnancy history. Current health, including: Menstrual cycle. Method of birth control. Emotional well-being. Home life and relationship well-being. Sexual activity and sexual health. Lifestyle, including: Alcohol, nicotine or tobacco, and drug use. Access to firearms. Diet, exercise, and sleep habits. Work and work Statistician. Sunscreen use. Safety issues such as seatbelt and bike helmet use. Physical exam Your health care provider will check your: Height and weight. These may be used to calculate your BMI (body mass index). BMI is a measurement that tells if you are at a healthy weight. Waist circumference. This measures the distance around your waistline. This measurement also tells if you are at a healthy weight and may help predict your risk of certain diseases, such as type 2 diabetes and high blood pressure. Heart rate and blood pressure. Body temperature. Skin for abnormal spots. What immunizations do I need? Vaccines are usually given at various ages, according to a schedule. Your health care provider will recommend vaccines for you based on your age, medical history, and lifestyle or other factors, such as travel or where you work. What tests do I need? Screening Your health care provider may recommend screening tests for certain conditions. This may include: Lipid and cholesterol levels. Diabetes screening. This is done by checking your blood sugar (glucose) after you have not eaten for a while (fasting). Pelvic exam and Pap test. Hepatitis B test. Hepatitis C  test. HIV (human immunodeficiency virus) test. STI (sexually transmitted infection) testing, if you are at risk. Lung cancer screening. Colorectal cancer screening. Mammogram. Talk with your health care provider about when you should start having regular mammograms. This may depend on whether you have a family history of breast cancer. BRCA-related cancer screening. This may be done if you have a family history of breast, ovarian, tubal, or peritoneal cancers. Bone density scan. This is done to screen for osteoporosis. Talk with your health care provider about your test results, treatment options, and if necessary, the need for more tests. Follow these instructions at home: Eating and drinking  Eat a diet that includes fresh fruits and vegetables, whole grains, lean protein, and low-fat dairy products. Take vitamin and mineral supplements as recommended by your health care provider. Do not drink alcohol if: Your health care provider tells you not to drink. You are pregnant, may be pregnant, or are planning to become pregnant. If you drink alcohol: Limit how much you have to 0-1 drink a day. Know how much alcohol is in your drink. In the [place] one drink equals one 12 oz bottle of beer (355 mL), one 5 oz glass of wine (148 mL), or one 1 oz glass of hard liquor (44 mL). Lifestyle Brush your teeth every morning and night with fluoride toothpaste. Floss one time each day. Exercise for at least 30 minutes 5 or more days each week. Do not use any products that contain nicotine or tobacco. These products include cigarettes, chewing tobacco, and vaping devices, such as e-cigarettes. If you need help quitting, ask your health care provider. Do not use drugs. If you are sexually active, practice safe sex. Use a condom or other form of protection to prevent  STIs. If you do not wish to become pregnant, use a form of birth control. If you plan to become pregnant, see your health care provider for a  prepregnancy visit. Take aspirin only as told by your health care provider. Make sure that you understand how much to take and what form to take. Work with your health care provider to find out whether it is safe and beneficial for you to take aspirin daily. Find healthy ways to manage stress, such as: Meditation, yoga, or listening to music. Journaling. Talking to a trusted person. Spending time with friends and family. Minimize exposure to UV radiation to reduce your risk of skin cancer. Safety Always wear your seat belt while driving or riding in a vehicle. Do not drive: If you have been drinking alcohol. Do not ride with someone who has been drinking. When you are tired or distracted. While texting. If you have been using any mind-altering substances or drugs. Wear a helmet and other protective equipment during sports activities. If you have firearms in your house, make sure you follow all gun safety procedures. Seek help if you have been physically or sexually abused. What's next? Visit your health care provider once a year for an annual wellness visit. Ask your health care provider how often you should have your eyes and teeth checked. Stay up to date on all vaccines. This information is not intended to replace advice given to you by your health care provider. Make sure you discuss any questions you have with your health care provider. Document Revised: 02/14/2021 Document Reviewed: 02/14/2021 Elsevier Patient Education  [place].

## 2021-08-13 ENCOUNTER — Other Ambulatory Visit: Payer: Self-pay | Admitting: Physician Assistant

## 2021-08-29 ENCOUNTER — Encounter
Payer: BC Managed Care – PPO | Attending: Physical Medicine & Rehabilitation | Admitting: Physical Medicine & Rehabilitation

## 2021-08-29 ENCOUNTER — Encounter: Payer: Self-pay | Admitting: Physical Medicine & Rehabilitation

## 2021-08-29 ENCOUNTER — Other Ambulatory Visit: Payer: Self-pay

## 2021-08-29 VITALS — BP 176/95 | HR 69 | Temp 98.8°F | Ht 66.0 in | Wt 124.4 lb

## 2021-08-29 DIAGNOSIS — D333 Benign neoplasm of cranial nerves: Secondary | ICD-10-CM | POA: Diagnosis not present

## 2021-08-29 DIAGNOSIS — G510 Bell's palsy: Secondary | ICD-10-CM

## 2021-08-29 DIAGNOSIS — H179 Unspecified corneal scar and opacity: Secondary | ICD-10-CM | POA: Diagnosis present

## 2021-08-29 MED ORDER — TRAMADOL HCL 50 MG PO TABS: 50.0000 mg | ORAL_TABLET | Freq: Every day | ORAL | 3 refills | Status: DC | PRN

## 2021-08-29 NOTE — Progress Notes (Signed)
Subjective:    Patient ID: Cristina Conner, female    DOB: [birth year], 60 y.o.   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  I last saw her over the summer.  From a pain standpoint she is doing fairly well.  She still uses tramadol once or twice a day at times for breakthrough pain.  She notices that when she has physical or mental stress that her pain levels can be higher.  She has not noticed a big change with the cold temperatures this past week.  Her mother came down with COVID and has been sick recently and this is led to a lot of stress over the past month.  She has been getting out more and doing some traveling and activities with friends which is helped her with pain also.  She continues to deal with some weakness on the left side of her face but has noticed some improvement.  She is followed by ophthalmology who is planning to do cataract surgery and to remove sutures from her left eyelid next month.  She still has some problems seeing peripherally mostly due to the lid suturing.  She is having occasional vestibular symptoms but they are fairly limited and only last for a few seconds.  She denies any falls or near misses.   Pain Inventory Average Pain 3 Pain Right Now 0 My pain is sharp  In the last 24 hours, has pain interfered with the following? General activity 1 Relation with others 0 Enjoyment of life 0 What TIME of day is your pain at its worst? evening Sleep (in general) Good  Pain is worse with: some activites and when doing a lot of activity Pain improves with: rest and medication Relief from Meds: 10  Family History  Problem Relation Age of Onset   Hypertension Mother    Heart disease Mother    Diabetes Mother    Hypertension Father    Colon cancer Neg Hx    Esophageal cancer Neg Hx    Stomach cancer Neg Hx    Rectal cancer Neg Hx    Colon polyps Neg Hx    Social History    Socioeconomic History   Marital status: Widowed    Spouse name: Not on file   Number of children: Not on file   Years of education: Not on file   Highest education level: Not on file  Occupational History   Not on file  Tobacco Use   Smoking status: Never   Smokeless tobacco: Never  Vaping Use   Vaping Use: Never used  Substance and Sexual Activity   Alcohol use: Not Currently   Drug use: No   Sexual activity: Not Currently    Partners: Male    Birth control/protection: Post-menopausal    Comment: 1st intercourse- 48, partners- 68,  married- 78 yrs   Other Topics Concern   Not on file  Social History Narrative   Widowed in summer 2020   Former smoker no alcohol or drug use   Social Determinants of Radio broadcast assistant Strain: Not on file  Food Insecurity: Not on file  Transportation Needs: Not on file  Physical Activity: Not on file  Stress: Not on file  Social Connections: Not on file   Past Surgical History:  Procedure Laterality Date   BRAIN SURGERY N/A    [employer]rees[employer] 07/22/2020   CESAREAN SECTION  x 2   COLONOSCOPY     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   at [place] Left 01/27/2020   Procedure: Left Craniotomy for Tumor Resection;  Surgeon: Judith Part, MD;  Location: [facility];  Service: Neurosurgery;  Laterality: Left;   ENDOMETRIAL ABLATION     ESOPHAGOGASTRODUODENOSCOPY  10/2019   LAPAROSCOPY N/A 01/19/2020   Procedure: LAPAROSCOPY DIAGNOSTIC LAPAROTOMY WITH SMALL BOWEL RESECTION;  Surgeon: Ileana Roup, MD;  Location: [facility];  Service: General;  Laterality: N/A;   PELVIC LAPAROSCOPY  1999   left salpingectomy, excision of Right, paratubal cyst   PELVIC LAPAROSCOPY     laparoscopy with lysis of pelvic adhesions   SHOULDER SURGERY  11/2010   "FROZEN SHOULDER"   TOTAL HIP ARTHROPLASTY Left 08/17/2019   Procedure: LEFT TOTAL HIP ARTHROPLASTY ANTERIOR APPROACH;  Surgeon: Melrose Nakayama, MD;  Location: [facility];  Service:  Orthopedics;  Laterality: Left;   TUBAL LIGATION     UPPER GASTROINTESTINAL ENDOSCOPY     VENTRICULOSTOMY Left 01/27/2020   Procedure: VENTRICULOSTOMY;  Surgeon: Judith Part, MD;  Location: [facility];  Service: Neurosurgery;  Laterality: Left;   Past Surgical History:  Procedure Laterality Date   BRAIN SURGERY N/A    [employer]reesia 07/22/2020   CESAREAN SECTION     x 2   COLONOSCOPY     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   at [place] Left 01/27/2020   Procedure: Left Craniotomy for Tumor Resection;  Surgeon: Judith Part, MD;  Location: [facility];  Service: Neurosurgery;  Laterality: Left;   ENDOMETRIAL ABLATION     ESOPHAGOGASTRODUODENOSCOPY  10/2019   LAPAROSCOPY N/A 01/19/2020   Procedure: LAPAROSCOPY DIAGNOSTIC LAPAROTOMY WITH SMALL BOWEL RESECTION;  Surgeon: Ileana Roup, MD;  Location: [facility];  Service: General;  Laterality: N/A;   PELVIC LAPAROSCOPY  1999   left salpingectomy, excision of Right, paratubal cyst   PELVIC LAPAROSCOPY     laparoscopy with lysis of pelvic adhesions   SHOULDER SURGERY  11/2010   "FROZEN SHOULDER"   TOTAL HIP ARTHROPLASTY Left 08/17/2019   Procedure: LEFT TOTAL HIP ARTHROPLASTY ANTERIOR APPROACH;  Surgeon: Melrose Nakayama, MD;  Location: [facility];  Service: Orthopedics;  Laterality: Left;   TUBAL LIGATION     UPPER GASTROINTESTINAL ENDOSCOPY     VENTRICULOSTOMY Left 01/27/2020   Procedure: VENTRICULOSTOMY;  Surgeon: Judith Part, MD;  Location: [facility];  Service: Neurosurgery;  Laterality: Left;   Past Medical History:  Diagnosis Date   Anxiety    Arthritis    Diverticulosis    GERD (gastroesophageal reflux disease)    Hypertension    Osteopenia    Post-operative nausea and vomiting    vomiting after general anesthesia per pt.   Pre-diabetes    Ht 5\' 6"  (1.676 m)    Wt 124 lb 6.4 oz (56.4 kg)    LMP 05/01/2009    BMI 20.08 kg/m   Opioid Risk Score:   Fall Risk Score:  `1  Depression screen PHQ  2/9  Depression screen [facility] 2/9 08/29/2021 07/20/2021 03/23/2021 11/29/2020 11/22/2020 07/25/2020 03/01/2020  Decreased Interest 0 0 0 0 (No Data) 0 1  Down, Depressed, Hopeless 0 0 0 0 0 0 0  PHQ - 2 Score 0 0 0 0 0 0 1  Altered sleeping - 0 0 - 1 2 1   Tired, decreased energy - 0 0 - 1 0 1  Change in appetite - 0 0 - 1 1 0  Feeling bad or failure about yourself  - 0 0 - 0 0 0  Trouble concentrating - 0 0 - 0 0 0  Moving slowly or fidgety/restless - 0 1 - 1 0 1  Suicidal thoughts - 0 0 - 0 0 0  PHQ-9 Score - 0 1 - 4 3 4   Difficult doing work/chores - - - - - Somewhat difficult Somewhat difficult  Some recent data might be hidden     Review of Systems  Constitutional: Negative.   HENT: Negative.    Eyes: Negative.   Respiratory: Negative.    Cardiovascular: Negative.   Gastrointestinal: Negative.   Endocrine: Negative.   Genitourinary: Negative.   Musculoskeletal: Negative.   Skin: Negative.   Allergic/Immunologic: Negative.   Neurological:  Positive for headaches.  Hematological: Negative.   Psychiatric/Behavioral: Negative.        Objective:   Physical Exam General: No acute distress HEENT: NCAT, EOMI, oral membranes moist Cards: reg rate  Chest: normal effort Abdomen: Soft, NT, ND Skin: dry, intact Extremities: no edema Psych: pleasant and appropriate  Neuro: closes eye lid to almost 95-100% with effort. Left facial droop, tongue deviation persists but perhaps a little better. Numbness along left hemi-face still present. Balance nearly normal. Tandem gait normal.  Strength is grossly 4+ to 5 out of 5 in all 4 limbs..   mild left hearing loss Musculoskeletal: No focal deficits.  Normal range of motion           Assessment & Plan:  1.Impaired function due to vestibular schwannoma with impaired balance, gait and L hearing loss and dysphagia, and mild/moderate cognitive issues                         -driving socializing more, happy about that! 2.  Pain: generally  much improved.             -May use tramadol for severe pain. RF today             -continue flexeril 5mg  prn for spasms             We will continue the controlled substance monitoring program, this consists of regular clinic visits, examinations, routine drug screening, pill counts as well as use of [place] Controlled Substance Reporting System. [place].   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              -continue per ophtho. Local eye care           -discussed importance of use of muscles in everyday activities. Not aware of acupuncture leading to muscle return in this scenario 4. ABLA: may use iron/B complex with mulit-vitamin to decrease some of the pills that she is taking daily 5. HTN:             borderline--needs to f/u with pcp. Up again today 6. Left hip pain s/p THR 12/20:              -stable 7. Vestibular dysfunction: improved. Occasional symptoms   15 minutes of face to face patient care time were spent during this visit. All questions were encouraged and answered.  Follow up with me in 6 mos .

## 2021-08-29 NOTE — Patient Instructions (Signed)
PLEASE FEEL FREE TO CALL OUR OFFICE WITH ANY PROBLEMS OR QUESTIONS ([phone])      

## 2021-08-30 ENCOUNTER — Encounter: Payer: Self-pay | Admitting: Physical Medicine & Rehabilitation

## 2021-09-18 ENCOUNTER — Other Ambulatory Visit: Payer: Self-pay | Admitting: Physical Medicine and Rehabilitation

## 2021-09-18 ENCOUNTER — Other Ambulatory Visit: Payer: Self-pay | Admitting: Physical Medicine & Rehabilitation

## 2021-09-18 DIAGNOSIS — G510 Bell's palsy: Secondary | ICD-10-CM

## 2021-09-18 DIAGNOSIS — D333 Benign neoplasm of cranial nerves: Secondary | ICD-10-CM

## 2021-10-24 ENCOUNTER — Encounter: Payer: Self-pay | Admitting: Obstetrics & Gynecology

## 2021-11-12 ENCOUNTER — Other Ambulatory Visit: Payer: Self-pay

## 2021-11-12 DIAGNOSIS — E7800 Pure hypercholesterolemia, unspecified: Secondary | ICD-10-CM

## 2021-11-12 DIAGNOSIS — I10 Essential (primary) hypertension: Secondary | ICD-10-CM

## 2021-11-13 ENCOUNTER — Other Ambulatory Visit: Payer: Self-pay

## 2021-11-13 ENCOUNTER — Other Ambulatory Visit: Payer: BC Managed Care – PPO

## 2021-11-13 DIAGNOSIS — I10 Essential (primary) hypertension: Secondary | ICD-10-CM

## 2021-11-13 DIAGNOSIS — E7800 Pure hypercholesterolemia, unspecified: Secondary | ICD-10-CM

## 2021-11-14 LAB — COMPREHENSIVE METABOLIC PANEL
ALT: 14 IU/L (ref 0–32)
AST: 16 IU/L (ref 0–40)
Albumin/Globulin Ratio: 1.7 (ref 1.2–2.2)
Albumin: 4.5 g/dL (ref 3.8–4.9)
Alkaline Phosphatase: 96 IU/L (ref 44–121)
BUN/Creatinine Ratio: 17 (ref 9–23)
BUN: 15 mg/dL (ref 6–24)
Bilirubin Total: 0.4 mg/dL (ref 0.0–1.2)
CO2: 26 mmol/L (ref 20–29)
Calcium: 10 mg/dL (ref 8.7–10.2)
Chloride: 99 mmol/L (ref 96–106)
Creatinine, Ser: 0.9 mg/dL (ref 0.57–1.00)
Globulin, Total: 2.7 g/dL (ref 1.5–4.5)
Glucose: 94 mg/dL (ref 70–99)
Potassium: 4.2 mmol/L (ref 3.5–5.2)
Sodium: 138 mmol/L (ref 134–144)
Total Protein: 7.2 g/dL (ref 6.0–8.5)
eGFR: 74 mL/min/{1.73_m2} (ref >59)

## 2021-11-14 LAB — LIPID PANEL
Chol/HDL Ratio: 2.5 ratio (ref 0.0–4.4)
Cholesterol, Total: 211 mg/dL — ABNORMAL HIGH (ref 100–199)
HDL: 83 mg/dL (ref >39)
LDL Chol Calc (NIH): 114 mg/dL — ABNORMAL HIGH (ref 0–99)
Triglycerides: 82 mg/dL (ref 0–149)
VLDL Cholesterol Cal: 14 mg/dL (ref 5–40)

## 2021-11-19 ENCOUNTER — Encounter: Payer: Self-pay | Admitting: Physician Assistant

## 2021-11-19 ENCOUNTER — Ambulatory Visit: Payer: BC Managed Care – PPO | Admitting: Physician Assistant

## 2021-11-19 VITALS — BP 124/82 | HR 80 | Temp 98.0°F | Ht 66.0 in | Wt 127.0 lb

## 2021-11-19 DIAGNOSIS — E785 Hyperlipidemia, unspecified: Secondary | ICD-10-CM | POA: Diagnosis not present

## 2021-11-19 DIAGNOSIS — I10 Essential (primary) hypertension: Secondary | ICD-10-CM

## 2021-11-19 DIAGNOSIS — G4700 Insomnia, unspecified: Secondary | ICD-10-CM | POA: Diagnosis not present

## 2021-11-19 NOTE — Assessment & Plan Note (Addendum)
-  BP mildly elevated on intake, BP repeated and improved. Ambulatory BP readings have been stable. Discussed recent CMP which is normal. Will continue current medication regimen. ?

## 2021-11-19 NOTE — Assessment & Plan Note (Signed)
-  Discussed recent lipid panel, LDL has increased from prior. Discussed with patient a heart healthy diet and reducing saturated fats. Will repeat lipid panel in 4 months. If LDL fails to improve then recommend to consider starting medication therapy. Will continue to monitor. ?

## 2021-11-19 NOTE — Progress Notes (Signed)
?Established patient visit ? ? ?Patient: Cristina Conner   DOB: 13-Mar-1961   61 y.o. Female  MRN: [id number] ?Visit Date: 11/19/2021 ? ?Chief Complaint  ?Patient presents with  ? Follow-up  ? Hyperlipidemia  ? Hypertension  ? ?Subjective  ?  ?HPI  ?Patient presents for follow-up on hypertension, hyperlipidemia and insomnia.  ? ?HTN: Pt denies chest pain, palpitations, dizziness or leg swelling. Taking medication as directed without side effects. Checks BP at home and readings range in 121-137/70-86. Pt follows a low salt diet. ? ?HLD: Pt reports likes to eat potato chips and diary products such as cheese and ice cream. States has not been as active. ? ?Insomnia: Patient reports had some trouble with staying asleep several weeks ago. Unfortunately, her mother and aunt passed away. In grief counseling which has been helpful.  ? ? ?Medications: ?Outpatient Medications Prior to Visit  ?Medication Sig  ? acetaminophen (TYLENOL) 500 MG tablet Take 1-2 tablets (500-1,000 mg total) by mouth every 4 (four) hours as needed for moderate pain.  ? calcium carbonate (OSCAL) 1500 (600 Ca) MG TABS tablet Take by mouth 2 (two) times daily with a meal.  ? cholecalciferol (VITAMIN D3) 25 MCG (1000 UNIT) tablet Take 2,000 Units by mouth daily.  ? cyclobenzaprine (FLEXERIL) 5 MG tablet TAKE 1 TABLET BY MOUTH THREE TIMES A DAY AS NEEDED FOR MUSCLE SPASMS  ? docusate sodium (COLACE) 100 MG capsule TAKE 1 CAPSULE BY MOUTH TWICE A DAY  ? metoprolol tartrate (LOPRESSOR) 25 MG tablet TAKE 1 TABLET BY MOUTH TWICE A DAY  ? Multiple Vitamin (MULTIVITAMIN) tablet Take 1 tablet by mouth daily.  ? traMADol (ULTRAM) 50 MG tablet Take 1 tablet (50 mg total) by mouth daily as needed.  ? ?No facility-administered medications prior to visit.  ? ? ?Review of Systems ?Review of Systems:  ?A fourteen system review of systems was performed and found to be positive as per HPI. ? ? ?  Objective  ?  ?BP 124/82   Pulse 80   Temp 98 ?F (36.7 ?C)   Ht '5\' 6"'$   (1.676 m)   Wt 127 lb (57.6 kg)   LMP 05/01/2009   SpO2 98%   BMI 20.50 kg/m?  ?BP Readings from Last 3 Encounters:  ?11/19/21 124/82  ?08/29/21 (!) 176/95  ?07/20/21 130/79  ? ?Wt Readings from Last 3 Encounters:  ?11/19/21 127 lb (57.6 kg)  ?08/29/21 124 lb 6.4 oz (56.4 kg)  ?07/20/21 129 lb 12.8 oz (58.9 kg)  ? ? ?Physical Exam  ?General:  Well Developed, well nourished, appropriate for stated age.  ?Neuro:  Alert and oriented,  extra-ocular muscles intact  ?HEENT:  Normocephalic, atraumatic, neck supple  ?Skin:  no gross rash, warm, pink. ?Cardiac:  RRR, S1 S2 ?Respiratory: CTA B/L  ?Vascular:  Ext warm, no cyanosis apprec.; cap RF less 2 sec. ?Psych:  No HI/SI, judgement and insight good, Euthymic mood. Full Affect. ? ? ?No results found for any visits on 11/19/21. ? Assessment & Plan  ?  ? ? ?Problem List Items Addressed This Visit   ? ?  ? Cardiovascular and Mediastinum  ? Hypertension - Primary (Chronic)  ?  -BP mildly elevated on intake, BP repeated and improved. Ambulatory BP readings have been stable. Discussed recent CMP which is normal. Will continue current medication regimen. ?  ?  ?  ? Other  ? Hyperlipidemia LDL goal <100  ?  -Discussed recent lipid panel, LDL has increased from prior. Discussed with patient  a heart healthy diet and reducing saturated fats. Will repeat lipid panel in 4 months. If LDL fails to improve then recommend to consider starting medication therapy. Will continue to monitor. ?  ?  ? ?Other Visit Diagnoses   ? ? Insomnia, unspecified type      ? ?  ? ?Insomnia: ?-Improved. Good sleep hygiene discussed. Recommend to continue with grief counseling.  ? ? ?Return in about 4 months (around 03/21/2022) for HTN, HLD and FBW (lipid panel, cmp) few days prior .  ?   ? ? ? ?Lorrene Reid, PA-C  ?[facility] ?[phone] (phone) ?[phone] (fax) ? ?[facility] ?

## 2021-11-19 NOTE — Patient Instructions (Signed)
Heart-Healthy Eating Plan Heart-healthy meal planning includes: Eating less unhealthy fats. Eating more healthy fats. Making other changes in your diet. Talk with your doctor or a diet specialist (dietitian) to create an eating plan that is right for you. What is my plan? Your doctor may recommend an eating plan that includes: Total fat: ______% or less of total calories a day. Saturated fat: ______% or less of total calories a day. Cholesterol: less than _________mg a day. What are tips for following this plan? Cooking Avoid frying your food. Try to bake, boil, grill, or broil it instead. You can also reduce fat by: Removing the skin from poultry. Removing all visible fats from meats. Steaming vegetables in water or broth. Meal planning  At meals, divide your plate into four equal parts: Fill one-half of your plate with vegetables and green salads. Fill one-fourth of your plate with whole grains. Fill one-fourth of your plate with lean protein foods. Eat 4-5 servings of vegetables per day. A serving of vegetables is: 1 cup of raw or cooked vegetables. 2 cups of raw leafy greens. Eat 4-5 servings of fruit per day. A serving of fruit is: 1 medium whole fruit.  cup of dried fruit.  cup of fresh, frozen, or canned fruit.  cup of 100% fruit juice. Eat more foods that have soluble fiber. These are apples, broccoli, carrots, beans, peas, and barley. Try to get 20-30 g of fiber per day. Eat 4-5 servings of nuts, legumes, and seeds per week: 1 serving of dried beans or legumes equals  cup after being cooked. 1 serving of nuts is  cup. 1 serving of seeds equals 1 tablespoon. General information Eat more home-cooked food. Eat less restaurant, buffet, and fast food. Limit or avoid alcohol. Limit foods that are high in starch and sugar. Avoid fried foods. Lose weight if you are overweight. Keep track of how much salt (sodium) you eat. This is important if you have high blood  pressure. Ask your doctor to tell you more about this. Try to add vegetarian meals each week. Fats Choose healthy fats. These include olive oil and canola oil, flaxseeds, walnuts, almonds, and seeds. Eat more omega-3 fats. These include salmon, mackerel, sardines, tuna, flaxseed oil, and ground flaxseeds. Try to eat fish at least 2 times each week. Check food labels. Avoid foods with trans fats or high amounts of saturated fat. Limit saturated fats. These are often found in animal products, such as meats, butter, and cream. These are also found in plant foods, such as palm oil, palm kernel oil, and coconut oil. Avoid foods with partially hydrogenated oils in them. These have trans fats. Examples are stick margarine, some tub margarines, cookies, crackers, and other baked goods. What foods can I eat? Fruits All fresh, canned (in natural juice), or frozen fruits. Vegetables Fresh or frozen vegetables (raw, steamed, roasted, or grilled). Green salads. Grains Most grains. Choose whole wheat and whole grains most of the time. Rice and pasta, including brown rice and pastas made with whole wheat. Meats and other proteins Lean, well-trimmed beef, veal, pork, and lamb. Chicken and turkey without skin. All fish and shellfish. Wild duck, rabbit, pheasant, and venison. Egg whites or low-cholesterol egg substitutes. Dried beans, peas, lentils, and tofu. Seeds and most nuts. Dairy Low-fat or nonfat cheeses, including ricotta and mozzarella. Skim or 1% milk that is liquid, powdered, or evaporated. Buttermilk that is made with low-fat milk. Nonfat or low-fat yogurt. Fats and oils Non-hydrogenated (trans-free) margarines. Vegetable oils, including   soybean, sesame, sunflower, olive, peanut, safflower, corn, canola, and cottonseed. Salad dressings or mayonnaise made with a vegetable oil. Beverages Mineral water. Coffee and tea. Diet carbonated beverages. Sweets and desserts Sherbet, gelatin, and fruit ice.  Small amounts of dark chocolate. Limit all sweets and desserts. Seasonings and condiments All seasonings and condiments. The items listed above may not be a complete list of foods and drinks you can eat. Contact a dietitian for more options. What foods should I avoid? Fruits Canned fruit in heavy syrup. Fruit in cream or butter sauce. Fried fruit. Limit coconut. Vegetables Vegetables cooked in cheese, cream, or butter sauce. Fried vegetables. Grains Breads that are made with saturated or trans fats, oils, or whole milk. Croissants. Sweet rolls. Donuts. High-fat crackers, such as cheese crackers. Meats and other proteins Fatty meats, such as hot dogs, ribs, sausage, bacon, rib-eye roast or steak. High-fat deli meats, such as salami and bologna. Caviar. Domestic duck and goose. Organ meats, such as liver. Dairy Cream, sour cream, cream cheese, and creamed cottage cheese. Whole-milk cheeses. Whole or 2% milk that is liquid, evaporated, or condensed. Whole buttermilk. Cream sauce or high-fat cheese sauce. Yogurt that is made from whole milk. Fats and oils Meat fat, or shortening. Cocoa butter, hydrogenated oils, palm oil, coconut oil, palm kernel oil. Solid fats and shortenings, including bacon fat, salt pork, lard, and butter. Nondairy cream substitutes. Salad dressings with cheese or sour cream. Beverages Regular sodas and juice drinks with added sugar. Sweets and desserts Frosting. Pudding. Cookies. Cakes. Pies. Milk chocolate or white chocolate. Buttered syrups. Full-fat ice cream or ice cream drinks. The items listed above may not be a complete list of foods and drinks to avoid. Contact a dietitian for more information. Summary Heart-healthy meal planning includes eating less unhealthy fats, eating more healthy fats, and making other changes in your diet. Eat a balanced diet. This includes fruits and vegetables, low-fat or nonfat dairy, lean protein, nuts and legumes, whole grains, and  heart-healthy oils and fats. This information is not intended to replace advice given to you by your health care provider. Make sure you discuss any questions you have with your health care provider. Document Revised: 12/28/2020 Document Reviewed: 12/28/2020 Elsevier Patient Education  2022 Elsevier Inc.  

## 2021-11-23 ENCOUNTER — Other Ambulatory Visit: Payer: Self-pay | Admitting: Physical Medicine & Rehabilitation

## 2021-11-23 DIAGNOSIS — G510 Bell's palsy: Secondary | ICD-10-CM

## 2021-11-23 DIAGNOSIS — D333 Benign neoplasm of cranial nerves: Secondary | ICD-10-CM

## 2021-12-20 ENCOUNTER — Encounter: Payer: Self-pay | Admitting: Obstetrics & Gynecology

## 2021-12-20 ENCOUNTER — Ambulatory Visit (INDEPENDENT_AMBULATORY_CARE_PROVIDER_SITE_OTHER): Payer: BC Managed Care – PPO | Admitting: Obstetrics & Gynecology

## 2021-12-20 VITALS — BP 124/80 | HR 64 | Resp 16 | Ht 65.0 in | Wt 129.0 lb

## 2021-12-20 DIAGNOSIS — M810 Age-related osteoporosis without current pathological fracture: Secondary | ICD-10-CM

## 2021-12-20 DIAGNOSIS — Z780 Asymptomatic menopausal state: Secondary | ICD-10-CM

## 2021-12-20 DIAGNOSIS — Z01419 Encounter for gynecological examination (general) (routine) without abnormal findings: Secondary | ICD-10-CM | POA: Diagnosis not present

## 2021-12-20 MED ORDER — ALENDRONATE SODIUM 70 MG PO TABS: 70.0000 mg | ORAL_TABLET | ORAL | 4 refills | Status: DC

## 2021-12-20 NOTE — Progress Notes (Signed)
? ? ?Cristina Conner 1961-07-12 [id number] ? ? ?History:    61 y.o.  G2P2L2 Widowed x 03/2019. ?  ?RP:  Established patient presenting for annual gyn exam  ?  ?HPI: Postmenopausal, well on no HRT.  No PMB.  No pelvic pain.  Abstinence.  Pap 10/2020 Neg.  Urine/BMs wnl.  Breasts wnl.  Mammo Neg 10/2021. BMI 21.47.  Healthy nutrition.  Health labs with Fam MD.  Colono normal 2014/Sigmoidoscopy 01/2020.  BD 12/2020 Osteoporosis T-Score -2.7 at the [facility] .  Brain tumor surgery/Cx Facial nerve damage, had Corneal transplant left eye. ?  ? ?Past medical history,surgical history, family history and social history were all reviewed and documented in the EPIC chart. ? ?Gynecologic History ?Patient's last menstrual period was 05/01/2009. ? ?Obstetric History ?OB History  ?Gravida Para Term Preterm AB Living  ?'2 2       2  '$ ?SAB IAB Ectopic Multiple Live Births  ?           ?  ?# Outcome Date GA Lbr Len/2nd Weight Sex Delivery Anes PTL Lv  ?2 Para           ?1 Para           ? ? ? ?ROS: A ROS was performed and pertinent positives and negatives are included in the history. ? GENERAL: No fevers or chills. HEENT: No change in vision, no earache, sore throat or sinus congestion. NECK: No pain or stiffness. CARDIOVASCULAR: No chest pain or pressure. No palpitations. PULMONARY: No shortness of breath, cough or wheeze. GASTROINTESTINAL: No abdominal pain, nausea, vomiting or diarrhea, melena or bright red blood per rectum. GENITOURINARY: No urinary frequency, urgency, hesitancy or dysuria. MUSCULOSKELETAL: No joint or muscle pain, no back pain, no recent trauma. DERMATOLOGIC: No rash, no itching, no lesions. ENDOCRINE: No polyuria, polydipsia, no heat or cold intolerance. No recent change in weight. HEMATOLOGICAL: No anemia or easy bruising or bleeding. NEUROLOGIC: No headache, seizures, numbness, tingling or weakness. PSYCHIATRIC: No depression, no loss of interest in normal activity or change in sleep pattern.  ?  ? ?Exam: ? ? ?BP  124/80   Pulse 64   Resp 16   Ht '5\' 5"'$  (1.651 m)   Wt 129 lb (58.5 kg)   LMP 05/01/2009   BMI 21.47 kg/m?  ? ?Body mass index is 21.47 kg/m?. ? ?General appearance : Well developed well nourished female. No acute distress ?HEENT: Eyes: no retinal hemorrhage or exudates,  Neck supple, trachea midline, no carotid bruits, no thyroidmegaly ?Lungs: Clear to auscultation, no rhonchi or wheezes, or rib retractions  ?Heart: Regular rate and rhythm, no murmurs or gallops ?Breast:Examined in sitting and supine position were symmetrical in appearance, no palpable masses or tenderness,  no skin retraction, no nipple inversion, no nipple discharge, no skin discoloration, no axillary or supraclavicular lymphadenopathy ?Abdomen: no palpable masses or tenderness, no rebound or guarding ?Extremities: no edema or skin discoloration or tenderness ? ?Pelvic: Vulva: Normal ?            Vagina: No gross lesions or discharge ? Cervix: No gross lesions or discharge ? Uterus  AV, normal size, shape and consistency, non-tender and mobile ? Adnexa  Without masses or tenderness ? Anus: Normal ? ? ?Assessment/Plan:  61 y.o. female for annual exam  ? ?1. Well female exam with routine gynecological exam ?Postmenopausal, well on no HRT.  No PMB.  No pelvic pain.  Abstinence.  Pap 10/2020 Neg.  Urine/BMs wnl.  Breasts  wnl.  Mammo Neg 10/2021. BMI 21.47.  Healthy nutrition.  Health labs with Fam MD.  Colono normal 2014/Sigmoidoscopy 01/2020.  BD 12/2020 Osteoporosis T-Score -2.7 at the [facility] .  Brain tumor surgery/Cx Facial nerve damage, had Corneal transplant left eye. ?  ?2. Postmenopausal ?Postmenopausal, well on no HRT.  No PMB.  No pelvic pain.  Abstinence.  ? ?3. Age-related osteoporosis without current pathological fracture ?BD 12/2020 Osteoporosis T-Score -2.7 at the [facility].  Vit D/Ca++ supplements, weight bearing physical activities.  Start on Fosamax 70 mg PO weekly.  Risks/benefits and usage thoroughly reviewed with patient.  In  particular the risks of GERD and Necrosis of the jaw were thoroughly discussed.  Recommend stopping Fosamax before and after any major dental work. ? ?Other orders ?- prednisoLONE acetate (PRED FORTE) 1 % ophthalmic suspension; Place 1 drop into the left eye every morning. ?- tobramycin (TOBREX) 0.3 % ophthalmic solution; Place 1 drop into the left eye 2 (two) times daily. ?- alendronate (FOSAMAX) 70 MG tablet; Take 1 tablet (70 mg total) by mouth every 7 (seven) days. Take with a full glass of water on an empty stomach.  ? ?Princess Bruins MD, 3:36 PM 12/20/2021 ? ?  ?

## 2021-12-23 ENCOUNTER — Encounter: Payer: Self-pay | Admitting: Obstetrics & Gynecology

## 2021-12-24 MED ORDER — IBANDRONATE SODIUM 150 MG PO TABS: 150.0000 mg | ORAL_TABLET | ORAL | 4 refills | Status: DC

## 2022-01-22 ENCOUNTER — Other Ambulatory Visit: Payer: Self-pay | Admitting: Physical Medicine & Rehabilitation

## 2022-01-22 DIAGNOSIS — G510 Bell's palsy: Secondary | ICD-10-CM

## 2022-01-22 DIAGNOSIS — D333 Benign neoplasm of cranial nerves: Secondary | ICD-10-CM

## 2022-02-10 ENCOUNTER — Other Ambulatory Visit: Payer: Self-pay | Admitting: Physician Assistant

## 2022-02-20 ENCOUNTER — Other Ambulatory Visit: Payer: Self-pay | Admitting: Physical Medicine & Rehabilitation

## 2022-02-20 DIAGNOSIS — G510 Bell's palsy: Secondary | ICD-10-CM

## 2022-02-20 DIAGNOSIS — H179 Unspecified corneal scar and opacity: Secondary | ICD-10-CM

## 2022-02-20 NOTE — Telephone Encounter (Signed)
PMP was Reviewed.  Tramadol e-scribed today.  

## 2022-02-27 ENCOUNTER — Encounter
Payer: BC Managed Care – PPO | Attending: Physical Medicine & Rehabilitation | Admitting: Physical Medicine & Rehabilitation

## 2022-02-27 ENCOUNTER — Encounter: Payer: Self-pay | Admitting: Physical Medicine & Rehabilitation

## 2022-02-27 VITALS — BP 163/100 | HR 86 | Ht 65.0 in | Wt 132.0 lb

## 2022-02-27 DIAGNOSIS — D333 Benign neoplasm of cranial nerves: Secondary | ICD-10-CM | POA: Diagnosis not present

## 2022-02-27 DIAGNOSIS — H179 Unspecified corneal scar and opacity: Secondary | ICD-10-CM | POA: Diagnosis not present

## 2022-02-27 DIAGNOSIS — G510 Bell's palsy: Secondary | ICD-10-CM | POA: Insufficient documentation

## 2022-02-27 MED ORDER — CYCLOBENZAPRINE HCL 5 MG PO TABS: ORAL_TABLET | ORAL | 4 refills | Status: DC

## 2022-02-27 MED ORDER — TRAMADOL HCL 50 MG PO TABS: 50.0000 mg | ORAL_TABLET | Freq: Two times a day (BID) | ORAL | 2 refills | Status: DC | PRN

## 2022-02-27 NOTE — Patient Instructions (Addendum)
PLEASE FEEL FREE TO CALL OUR OFFICE WITH ANY PROBLEMS OR QUESTIONS ([phone])   USING EYE PATCH AT NIGHT?

## 2022-02-27 NOTE — Progress Notes (Signed)
Subjective:    Patient ID: Cristina Conner, female    DOB: [birth year], 61 y.o.   MRN: [id number]  HPI  Mrs Besecker is here in follow up of her chronic balance and visual issues. She continues to follow up with ophtho regarding her vision. She may have a cataracts surgery. She is wearing a contact to protect her cornea. She is not wearing an eye patch at night  She still deals with balance issues. She tends to walk a lot and does it on flat/level surfaces. She denies any falls.    She uses tramadol for breakthrough pain as well as flexeril for spasm both of which help. She uses tramadol 1-2 x per day when he pain is the worst, typically in the evening. Pain is most typically along the left side of her face and temple. It is sometime is a heavy, swollen feeling and at times can feel "tingling".   Pain Inventory Average Pain 3 Pain Right Now 1 My pain is sharp, dull, stabbing, and tingling  In the last 24 hours, has pain interfered with the following? General activity 1 Relation with others 0 Enjoyment of life 0 What TIME of day is your pain at its worst? evening Sleep (in general) Good  Pain is worse with: some activites Pain improves with: pacing activities and medication Relief from Meds: 8  Family History  Problem Relation Age of Onset   Hypertension Mother    Heart disease Mother    Diabetes Mother    Kidney disease Mother    Hypertension Father    Social History   Socioeconomic History   Marital status: Widowed    Spouse name: Not on file   Number of children: Not on file   Years of education: Not on file   Highest education level: Not on file  Occupational History   Not on file  Tobacco Use   Smoking status: Never   Smokeless tobacco: Never  Vaping Use   Vaping Use: Never used  Substance and Sexual Activity   Alcohol use: Not Currently   Drug use: No   Sexual activity: Not Currently    Partners: Male    Birth control/protection: Post-menopausal     Comment: 1st intercourse- 64, partners- 59  Other Topics Concern   Not on file  Social History Narrative   Widowed in summer 2020   Former smoker no alcohol or drug use   Social Determinants of Radio broadcast assistant Strain: Not on file  Food Insecurity: Not on file  Transportation Needs: Not on file  Physical Activity: Not on file  Stress: Not on file  Social Connections: Not on file   Past Surgical History:  Procedure Laterality Date   BRAIN SURGERY N/A    [employer]reesia 07/22/2020   CESAREAN SECTION     x 2   COLONOSCOPY     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   at [place] Left 01/27/2020   Procedure: Left Craniotomy for Tumor Resection;  Surgeon: Judith Part, MD;  Location: [facility];  Service: Neurosurgery;  Laterality: Left;   ENDOMETRIAL ABLATION     ESOPHAGOGASTRODUODENOSCOPY  10/2019   LAPAROSCOPY N/A 01/19/2020   Procedure: LAPAROSCOPY DIAGNOSTIC LAPAROTOMY WITH SMALL BOWEL RESECTION;  Surgeon: Ileana Roup, MD;  Location: [facility];  Service: General;  Laterality: N/A;   PELVIC LAPAROSCOPY  1999   left salpingectomy, excision of Right, paratubal cyst   PELVIC LAPAROSCOPY     laparoscopy with  lysis of pelvic adhesions   SHOULDER SURGERY  11/2010   "FROZEN SHOULDER"   TOTAL HIP ARTHROPLASTY Left 08/17/2019   Procedure: LEFT TOTAL HIP ARTHROPLASTY ANTERIOR APPROACH;  Surgeon: Melrose Nakayama, MD;  Location: [facility];  Service: Orthopedics;  Laterality: Left;   TUBAL LIGATION     UPPER GASTROINTESTINAL ENDOSCOPY     VENTRICULOSTOMY Left 01/27/2020   Procedure: VENTRICULOSTOMY;  Surgeon: Judith Part, MD;  Location: [facility];  Service: Neurosurgery;  Laterality: Left;   Past Surgical History:  Procedure Laterality Date   BRAIN SURGERY N/A    [employer]reesia 07/22/2020   CESAREAN SECTION     x 2   COLONOSCOPY     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   at [place] Left 01/27/2020   Procedure: Left Craniotomy for Tumor Resection;   Surgeon: Judith Part, MD;  Location: [facility] [place];  Service: Neurosurgery;  Laterality: Left;   ENDOMETRIAL ABLATION     ESOPHAGOGASTRODUODENOSCOPY  10/2019   LAPAROSCOPY N/A 01/19/2020   Procedure: LAPAROSCOPY DIAGNOSTIC LAPAROTOMY WITH SMALL BOWEL RESECTION;  Surgeon: Ileana Roup, MD;  Location[facility];  Service: General;  Laterality: N/A;   PELVIC LAPAROSCOPY  1999   left salpingectomy, excision of Right, paratubal cyst   PELVIC LAPAROSCOPY     laparoscopy with lysis of pelvic adhesions   SHOULDER SURGERY  11/2010   "FROZEN SHOULDER"   TOTAL HIP ARTHROPLASTY Left 08/17/2019   Procedure: LEFT TOTAL HIP ARTHROPLASTY ANTERIOR APPROACH;  Surgeon: Melrose Nakayama, MD;  Location: [facility];  Service: Orthopedics;  Laterality: Left;   TUBAL LIGATION     UPPER GASTROINTESTINAL ENDOSCOPY     VENTRICULOSTOMY Left 01/27/2020   Procedure: VENTRICULOSTOMY;  Surgeon: Judith Part, MD;  Location: [facility];  Service: Neurosurgery;  Laterality: Left;   Past Medical History:  Diagnosis Date   Anxiety    Arthritis    Brain tumor ([place])    vestibular neuroma   Diverticulosis    GERD (gastroesophageal reflux disease)    Hypertension    Osteopenia    Post-operative nausea and vomiting    vomiting after general anesthesia per pt.   Pre-diabetes    BP (!) 163/100   Pulse 86   Ht '5\' 5"'$  (1.651 m)   Wt 132 lb (59.9 kg)   LMP 05/01/2009   SpO2 97%   BMI 21.97 kg/m   Opioid Risk Score:   Fall Risk Score:  `1  Depression screen [facility] 2/9     08/29/2021    9:49 AM 07/20/2021   10:37 AM 03/23/2021   10:21 AM 11/29/2020   10:13 AM 11/22/2020   10:11 AM 07/25/2020   11:01 AM 03/01/2020   11:46 AM  Depression screen PHQ 2/9  Decreased Interest 0 0 0 0  0 1  Down, Depressed, Hopeless 0 0 0 0 0 0 0  PHQ - 2 Score 0 0 0 0 0 0 1  Altered sleeping  0 0  '1 2 1  '$ Tired, decreased energy  0 0  1 0 1  Change in appetite  0 0  1 1 0  Feeling bad or failure about yourself   0 0  0 0 0  Trouble  concentrating  0 0  0 0 0  Moving slowly or fidgety/restless  0 1  1 0 1  Suicidal thoughts  0 0  0 0 0  PHQ-9 Score  0 '1  4 3 4  '$ Difficult doing work/chores  Somewhat difficult Somewhat difficult     Review of Systems  Genitourinary:        Left side of face  All other systems reviewed and are negative.     Objective:   Physical Exam  General: No acute distress HEENT: NCAT, EOMI, oral membranes moist. No scleral irritation Cards: reg rate  Chest: normal effort Abdomen: Soft, NT, ND Skin: dry, intact Extremities: no edema Psych: pleasant and appropriate  Neuro: closes eye lid to almost 95%  . Left facial droop, tongue deviation persists but perhaps a little better. Numbness along left hemi-face still present. Balance nearly normal. Tandem gait normal.  Strength is grossly 4+ to 5 out of 5 in all 4 limbs..   mild left hearing loss Musculoskeletal: No focal deficits.  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 2.  Pain: generally much improved.             -May use tramadol for severe pain. RF today #60             -continue flexeril '5mg'$  prn for spasms--this helps             We will continue the controlled substance monitoring program, this consists of regular clinic visits, examinations, routine drug screening, pill counts as well as use of [place] Controlled Substance Reporting System. [place].    -would beneift from brief breaks/rests where she closes her eyes and relaxes.  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             -continue per ophtho. Local eye care           -eye patch at night might be helpful vs ?swim goggle" 4. ABLA:   iron/B complex with mulit-vitamin  5. HTN:             pt says  bp has been well controlled at home and today's reading is not indicative 6. Left hip pain s/p THR 12/20:              -stable 7. Vestibular dysfunction: improved. Occasional symptoms. Coping well.    15 minutes of face to face patient care time were spent during this visit. All questions were encouraged and answered.  Follow up with me in 6 mos .

## 2022-03-15 ENCOUNTER — Other Ambulatory Visit: Payer: Self-pay | Admitting: Physician Assistant

## 2022-03-15 DIAGNOSIS — E785 Hyperlipidemia, unspecified: Secondary | ICD-10-CM

## 2022-03-15 DIAGNOSIS — I10 Essential (primary) hypertension: Secondary | ICD-10-CM

## 2022-03-22 ENCOUNTER — Other Ambulatory Visit: Payer: BC Managed Care – PPO

## 2022-03-22 DIAGNOSIS — E785 Hyperlipidemia, unspecified: Secondary | ICD-10-CM

## 2022-03-22 DIAGNOSIS — I10 Essential (primary) hypertension: Secondary | ICD-10-CM

## 2022-03-23 LAB — LIPID PANEL
Chol/HDL Ratio: 2.9 ratio (ref 0.0–4.4)
Cholesterol, Total: 178 mg/dL (ref 100–199)
HDL: 62 mg/dL (ref >39)
LDL Chol Calc (NIH): 102 mg/dL — ABNORMAL HIGH (ref 0–99)
Triglycerides: 77 mg/dL (ref 0–149)
VLDL Cholesterol Cal: 14 mg/dL (ref 5–40)

## 2022-03-23 LAB — COMPREHENSIVE METABOLIC PANEL
ALT: 15 IU/L (ref 0–32)
AST: 20 IU/L (ref 0–40)
Albumin/Globulin Ratio: 1.6 (ref 1.2–2.2)
Albumin: 4.4 g/dL (ref 3.8–4.9)
Alkaline Phosphatase: 73 IU/L (ref 44–121)
BUN/Creatinine Ratio: 14 (ref 9–23)
BUN: 12 mg/dL (ref 6–24)
Bilirubin Total: 0.3 mg/dL (ref 0.0–1.2)
CO2: 22 mmol/L (ref 20–29)
Calcium: 9.5 mg/dL (ref 8.7–10.2)
Chloride: 100 mmol/L (ref 96–106)
Creatinine, Ser: 0.88 mg/dL (ref 0.57–1.00)
Globulin, Total: 2.7 g/dL (ref 1.5–4.5)
Glucose: 93 mg/dL (ref 70–99)
Potassium: 4.3 mmol/L (ref 3.5–5.2)
Sodium: 137 mmol/L (ref 134–144)
Total Protein: 7.1 g/dL (ref 6.0–8.5)
eGFR: 76 mL/min/{1.73_m2} (ref >59)

## 2022-03-26 ENCOUNTER — Encounter: Payer: Self-pay | Admitting: Physician Assistant

## 2022-03-26 ENCOUNTER — Ambulatory Visit (INDEPENDENT_AMBULATORY_CARE_PROVIDER_SITE_OTHER): Payer: BC Managed Care – PPO | Admitting: Physician Assistant

## 2022-03-26 VITALS — BP 138/82 | HR 85 | Ht 64.96 in | Wt 134.0 lb

## 2022-03-26 DIAGNOSIS — E785 Hyperlipidemia, unspecified: Secondary | ICD-10-CM

## 2022-03-26 DIAGNOSIS — G4700 Insomnia, unspecified: Secondary | ICD-10-CM | POA: Diagnosis not present

## 2022-03-26 DIAGNOSIS — I10 Essential (primary) hypertension: Secondary | ICD-10-CM

## 2022-03-26 DIAGNOSIS — M810 Age-related osteoporosis without current pathological fracture: Secondary | ICD-10-CM

## 2022-03-26 NOTE — Patient Instructions (Signed)

## 2022-03-26 NOTE — Assessment & Plan Note (Signed)
-  Discussed with patient recent lipid panel and cmp results. LDL has improved from 114 to 102, slightly above goal of less than 100. Patient will continue to work on diet and lifestyle interventions. Will repeat lipid panel with annual physical in 4 months.

## 2022-03-26 NOTE — Assessment & Plan Note (Signed)
-  Discussed with patient BP today along with ambulatory BP readings are essentially stable. Will continue with metoprolol tartrate 25 mg BID. Advised to continue with ambulatory BP monitoring and notify the office if readings consistently >135/85 for treatment adjustments. Pt verbalized understanding. Will continue to monitor.

## 2022-03-26 NOTE — Progress Notes (Signed)
Established patient visit   Patient: Cristina Conner   DOB: 1961-01-29   61 y.o. Female  MRN: [id number] Visit Date: 03/26/2022  Chief Complaint  Patient presents with   Follow-up   Subjective    HPI  Patient presents for chronic follow-up. Patient was started on medication for osteoporosis by OB/GYN. Reports did not tolerate Fosamax due to body aches and was changed to [facility] which she is tolerating without issues.   HTN: Pt denies chest pain, palpitations, dizziness, syncope or lower extremity swelling. Taking medication as directed without side effects. Checks BP at home and readings range in 116-136/70-80s. Pt follows a low salt diet.  HLD: Pt reports has reduced bacon and potato chips. Walking 3 miles 4 times per week.  Insomnia: Reports sleeping more but it is when she takes her muscle relaxer. Has not needed to take sleeping medication.   Medications: Outpatient Medications Prior to Visit  Medication Sig   acetaminophen (TYLENOL) 500 MG tablet Take 1-2 tablets (500-1,000 mg total) by mouth every 4 (four) hours as needed for moderate pain.   calcium carbonate (OSCAL) 1500 (600 Ca) MG TABS tablet Take by mouth daily.   cholecalciferol (VITAMIN D3) 25 MCG (1000 UNIT) tablet Take 1,000 Units by mouth daily.   cyclobenzaprine (FLEXERIL) 5 MG tablet TAKE 1 TABLET BY MOUTH THREE TIMES A DAY AS NEEDED FOR MUSCLE SPASMS   docusate sodium (COLACE) 100 MG capsule TAKE 1 CAPSULE BY MOUTH TWICE A DAY   ibandronate (BONIVA) 150 MG tablet Take 1 tablet (150 mg total) by mouth every 30 (thirty) days. Take in the morning with a full glass of water, on an empty stomach, and do not take anything else by mouth or lie down for the next 30 min.   metoprolol tartrate (LOPRESSOR) 25 MG tablet TAKE 1 TABLET BY MOUTH TWICE A DAY   Multiple Vitamin (MULTIVITAMIN) tablet Take 1 tablet by mouth daily.   prednisoLONE acetate (PRED FORTE) 1 % ophthalmic suspension Place 1 drop into the left eye every  morning.   tobramycin (TOBREX) 0.3 % ophthalmic solution Place 1 drop into the left eye 2 (two) times daily.   traMADol (ULTRAM) 50 MG tablet Take 1 tablet (50 mg total) by mouth every 12 (twelve) hours as needed.   No facility-administered medications prior to visit.    Review of Systems Review of Systems:  A fourteen system review of systems was performed and found to be positive as per HPI.   Last CBC Lab Results  Component Value Date   WBC 5.8 03/20/2021   HGB 12.6 03/20/2021   HCT 38.2 03/20/2021   MCV 80 03/20/2021   MCH 26.4 (L) 03/20/2021   RDW 16.7 (H) 03/20/2021   PLT 308 64/33/2951   Last metabolic panel Lab Results  Component Value Date   GLUCOSE 93 03/22/2022   NA 137 03/22/2022   K 4.3 03/22/2022   CL 100 03/22/2022   CO2 22 03/22/2022   BUN 12 03/22/2022   CREATININE 0.88 03/22/2022   EGFR 76 03/22/2022   CALCIUM 9.5 03/22/2022   PHOS 2.6 01/30/2020   PROT 7.1 03/22/2022   ALBUMIN 4.4 03/22/2022   LABGLOB 2.7 03/22/2022   AGRATIO 1.6 03/22/2022   BILITOT 0.3 03/22/2022   ALKPHOS 73 03/22/2022   AST 20 03/22/2022   ALT 15 03/22/2022   ANIONGAP 9 02/07/2020   Last lipids Lab Results  Component Value Date   CHOL 178 03/22/2022   HDL 62 03/22/2022   LDLCALC  102 (H) 03/22/2022   LDLDIRECT 91 07/25/2020   TRIG 77 03/22/2022   CHOLHDL 2.9 03/22/2022   Last hemoglobin A1c Lab Results  Component Value Date   HGBA1C 5.5 03/20/2021   Last thyroid functions Lab Results  Component Value Date   TSH 1.070 03/20/2021   T3TOTAL 112 01/23/2020       Objective    BP 138/82   Pulse 85   Ht 5' 4.96" (1.65 m)   Wt 134 lb (60.8 kg)   LMP 05/01/2009   SpO2 100%   BMI 22.33 kg/m  BP Readings from Last 3 Encounters:  03/26/22 138/82  02/27/22 (!) 163/100  12/20/21 124/80   Wt Readings from Last 3 Encounters:  03/26/22 134 lb (60.8 kg)  02/27/22 132 lb (59.9 kg)  12/20/21 129 lb (58.5 kg)    Physical Exam  General:  Pleasant and  cooperative, appropriate for stated age.  Neuro:  Alert and oriented HEENT:  Normocephalic, atraumatic, neck supple  Skin:  no gross rash, warm, pink. Cardiac:  RRR, S1 S2, no murmur  Respiratory: CTA B/L  Vascular:  Ext warm, no cyanosis apprec.; cap RF less 2 sec. No edema. Psych:  No HI/SI, judgement and insight good, Euthymic mood. Full Affect.   No results found for any visits on 03/26/22.  Assessment & Plan      Problem List Items Addressed This Visit       Cardiovascular and Mediastinum   Hypertension - Primary (Chronic)    -Discussed with patient BP today along with ambulatory BP readings are essentially stable. Will continue with metoprolol tartrate 25 mg BID. Advised to continue with ambulatory BP monitoring and notify the office if readings consistently >135/85 for treatment adjustments. Pt verbalized understanding. Will continue to monitor.        Other   Hyperlipidemia LDL goal <100    -Discussed with patient recent lipid panel and cmp results. LDL has improved from 114 to 102, slightly above goal of less than 100. Patient will continue to work on diet and lifestyle interventions. Will repeat lipid panel with annual physical in 4 months.       Other Visit Diagnoses     Insomnia, unspecified type       Osteoporosis without current pathological fracture, unspecified osteoporosis type          Insomnia: -Stable. Discussed with patient drowsiness is a potential side effect with muscle relaxer (Flexeril) so recommend to continue to avoid sleeping aids when taking muscle relaxer. Will continue to monitor.  Osteoporosis: -Reviewed OB/GYN notes and bone density.  -Patient recently had labs drawn so will contact [facility] to add Vitamin D. If unable to add lab, will contact patient to return for lab visit. Recommend to continue with calcium and Vitamin D supplements.  Return in about 4 months (around 07/27/2022) for CPE and FBW.        Lorrene Reid, PA-C  [facility]  [facility] [phone] (phone) [phone] (fax)  [place]

## 2022-03-27 ENCOUNTER — Encounter: Payer: Self-pay | Admitting: Physician Assistant

## 2022-03-27 LAB — SPECIMEN STATUS REPORT

## 2022-03-27 LAB — VITAMIN D 25 HYDROXY (VIT D DEFICIENCY, FRACTURES): Vit D, 25-Hydroxy: 121 ng/mL — ABNORMAL HIGH (ref 30.0–100.0)

## 2022-04-05 ENCOUNTER — Encounter: Payer: Self-pay | Admitting: Physical Medicine & Rehabilitation

## 2022-04-15 ENCOUNTER — Other Ambulatory Visit: Payer: Self-pay | Admitting: Physical Medicine & Rehabilitation

## 2022-07-13 ENCOUNTER — Other Ambulatory Visit: Payer: Self-pay | Admitting: Physical Medicine & Rehabilitation

## 2022-07-13 DIAGNOSIS — D333 Benign neoplasm of cranial nerves: Secondary | ICD-10-CM

## 2022-07-13 DIAGNOSIS — G510 Bell's palsy: Secondary | ICD-10-CM

## 2022-07-29 ENCOUNTER — Encounter: Payer: BC Managed Care – PPO | Admitting: Physician Assistant

## 2022-07-30 ENCOUNTER — Ambulatory Visit (INDEPENDENT_AMBULATORY_CARE_PROVIDER_SITE_OTHER): Payer: BC Managed Care – PPO | Admitting: Nurse Practitioner

## 2022-07-30 ENCOUNTER — Encounter: Payer: Self-pay | Admitting: Nurse Practitioner

## 2022-07-30 VITALS — BP 168/98 | HR 76 | Resp 20 | Ht 64.96 in | Wt 138.0 lb

## 2022-07-30 DIAGNOSIS — E785 Hyperlipidemia, unspecified: Secondary | ICD-10-CM

## 2022-07-30 DIAGNOSIS — Z0001 Encounter for general adult medical examination with abnormal findings: Secondary | ICD-10-CM | POA: Diagnosis not present

## 2022-07-30 DIAGNOSIS — G510 Bell's palsy: Secondary | ICD-10-CM | POA: Diagnosis not present

## 2022-07-30 DIAGNOSIS — I10 Essential (primary) hypertension: Secondary | ICD-10-CM | POA: Diagnosis not present

## 2022-07-30 NOTE — Progress Notes (Signed)
Complete physical exam   Patient: Cristina Conner   DOB: [birth year]   61 y.o. Female  MRN: [id number] Visit Date: 07/30/2022    Chief Complaint  Patient presents with   Annual Exam    Non fasting   Subjective    Cristina Conner is a 61 y.o. female who presents today for a complete physical exam.  She reports consuming a  generally healthy  diet.  She generally feels well. She does not have additional problems to discuss today.   HPI HPI     Annual Exam    Additional comments: Non fasting      Last edited by Gemma Payor, CMA on 07/30/2022  3:37 PM.       Annual physical.  Hypertension  -generally elevated at start of visit.  --has been monitoring this at home. Blood pressure usually around 130/80  -denies chest pain, chest pressure, or shortness of breath. He denies headaches or visual disturbances. He denies abdominal pain, nausea, vomiting, or changes in bowel or bladder habits.    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  -paralysis of left side of mouth.    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   Pre-diabetes    Past Surgical History:  Procedure Laterality Date   BRAIN SURGERY N/A    Phreesia 07/22/2020   CESAREAN SECTION     x 2   COLONOSCOPY     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   at [place] Left 01/27/2020   Procedure: Left Craniotomy for Tumor Resection;  Surgeon: Judith Part, MD;  Location: [place];  Service: Neurosurgery;  Laterality: Left;   ENDOMETRIAL ABLATION     ESOPHAGOGASTRODUODENOSCOPY  10/2019    LAPAROSCOPY N/A 01/19/2020   Procedure: LAPAROSCOPY DIAGNOSTIC LAPAROTOMY WITH SMALL BOWEL RESECTION;  Surgeon: Ileana Roup, MD;  [facility];  Service: General;  Laterality: N/A;   PELVIC LAPAROSCOPY  1999   left salpingectomy, excision of Right, paratubal cyst   PELVIC LAPAROSCOPY     laparoscopy with lysis of pelvic adhesions   SHOULDER SURGERY  11/2010   "FROZEN SHOULDER"   TOTAL HIP ARTHROPLASTY Left 08/17/2019   Procedure: LEFT TOTAL HIP ARTHROPLASTY ANTERIOR APPROACH;  Surgeon: Melrose Nakayama, MD;  [facility];  Service: Orthopedics;  Laterality: Left;   TUBAL LIGATION     UPPER GASTROINTESTINAL ENDOSCOPY     VENTRICULOSTOMY Left 01/27/2020   Procedure: VENTRICULOSTOMY;  Surgeon: Judith Part, MD;  Location: [place];  Service: Neurosurgery;  Laterality: Left;   Social History   Socioeconomic History   Marital status: Widowed    Spouse name: Not on file   Number of children: Not on file   Years of education: Not on file   Highest education level: Not on file  Occupational History   Not on file  Tobacco Use   Smoking status: Never   Smokeless tobacco: Never  Vaping Use   Vaping Use: Never used  Substance and Sexual Activity   Alcohol  use: Not Currently   Drug use: No   Sexual activity: Not Currently    Partners: Male    Birth control/protection: Post-menopausal    Comment: 1st intercourse- 27, partners- 1  Other Topics Concern   Not on file  Social History Narrative   Widowed in summer 2020   Former smoker no alcohol or drug use   Social Determinants of Radio broadcast assistant Strain: Not on file  Food Insecurity: Not on file  Transportation Needs: Not on file  Physical Activity: Not on file  Stress: Not on file  Social Connections: Not on file  Intimate Partner Violence: Not on file   Family Status  Relation Name Status   Mother  Deceased   Father  Deceased   Family History  Problem Relation Age of Onset   Hypertension Mother     Heart disease Mother    Diabetes Mother    Kidney disease Mother    Hypertension Father    Allergies  Allergen Reactions   Hydrocodone Nausea And Vomiting    Patient Care Team: Lorrene Reid, PA-C as PCP - General (Physician Assistant) Druscilla Brownie, MD as Consulting Physician (Dermatology) Opthamology, [place] (Ophthalmology) Princess Bruins, MD as Consulting Physician (Obstetrics and Gynecology) Melrose Nakayama, MD as Consulting Physician (Orthopedic Surgery) Ileana Roup, MD as Consulting Physician (General Surgery) Gatha Mayer, MD as Consulting Physician (Gastroenterology)   Medications: Outpatient Medications Prior to Visit  Medication Sig   acetaminophen (TYLENOL) 500 MG tablet Take 1-2 tablets (500-1,000 mg total) by mouth every 4 (four) hours as needed for moderate pain.   calcium carbonate (OSCAL) 1500 (600 Ca) MG TABS tablet Take by mouth daily.   cyclobenzaprine (FLEXERIL) 5 MG tablet TAKE 1 TABLET BY MOUTH THREE TIMES A DAY AS NEEDED FOR MUSCLE SPASM   docusate sodium (COLACE) 100 MG capsule TAKE 1 CAPSULE BY MOUTH TWICE A DAY   metoprolol tartrate (LOPRESSOR) 25 MG tablet TAKE 1 TABLET BY MOUTH TWICE A DAY   moxifloxacin (VIGAMOX) 0.5 % ophthalmic solution Place 1 drop into the left eye 3 (three) times daily.   Multiple Vitamin (MULTIVITAMIN) tablet Take 1 tablet by mouth daily.   prednisoLONE acetate (PRED FORTE) 1 % ophthalmic suspension Place 1 drop into the left eye every morning.   [DISCONTINUED] traMADol (ULTRAM) 50 MG tablet Take 1 tablet (50 mg total) by mouth every 12 (twelve) hours as needed.   cholecalciferol (VITAMIN D3) 25 MCG (1000 UNIT) tablet Take 1,000 Units by mouth daily. (Patient not taking: Reported on 07/30/2022)   ibandronate (BONIVA) 150 MG tablet Take 1 tablet (150 mg total) by mouth every 30 (thirty) days. Take in the morning with a full glass of water, on an empty stomach, and do not take anything else by mouth or lie  down for the next 30 min. (Patient not taking: Reported on 07/30/2022)   tobramycin (TOBREX) 0.3 % ophthalmic solution Place 1 drop into the left eye 2 (two) times daily. (Patient not taking: Reported on 07/30/2022)   No facility-administered medications prior to visit.    Review of Systems  Constitutional:  Negative for activity change, appetite change, chills, fatigue and fever.  HENT:  Negative for congestion, postnasal drip, rhinorrhea, sinus pressure, sinus pain, sneezing and sore throat.   Eyes: Negative.   Respiratory:  Negative for cough, chest tightness, shortness of breath and wheezing.   Cardiovascular:  Negative for chest pain and palpitations.  Gastrointestinal:  Negative for abdominal pain, constipation, diarrhea, nausea and  vomiting.  Endocrine: Negative for cold intolerance, heat intolerance, polydipsia and polyuria.  Genitourinary:  Negative for dyspareunia, dysuria, flank pain, frequency and urgency.  Musculoskeletal:  Negative for arthralgias, back pain and myalgias.  Skin:  Negative for rash.  Allergic/Immunologic: Negative for environmental allergies.  Neurological:  Negative for dizziness, weakness and headaches.  Hematological:  Negative for adenopathy.  Psychiatric/Behavioral:  The patient is not nervous/anxious.     Last CBC Lab Results  Component Value Date   WBC 5.8 03/20/2021   HGB 12.6 03/20/2021   HCT 38.2 03/20/2021   MCV 80 03/20/2021   MCH 26.4 (L) 03/20/2021   RDW 16.7 (H) 03/20/2021   PLT 308 34/74/2595   Last metabolic panel Lab Results  Component Value Date   GLUCOSE 93 03/22/2022   NA 137 03/22/2022   K 4.3 03/22/2022   CL 100 03/22/2022   CO2 22 03/22/2022   BUN 12 03/22/2022   CREATININE 0.88 03/22/2022   EGFR 76 03/22/2022   CALCIUM 9.5 03/22/2022   PHOS 2.6 01/30/2020   PROT 7.1 03/22/2022   ALBUMIN 4.4 03/22/2022   LABGLOB 2.7 03/22/2022   AGRATIO 1.6 03/22/2022   BILITOT 0.3 03/22/2022   ALKPHOS 73 03/22/2022   AST 20  03/22/2022   ALT 15 03/22/2022   ANIONGAP 9 02/07/2020   Last lipids Lab Results  Component Value Date   CHOL 178 03/22/2022   HDL 62 03/22/2022   LDLCALC 102 (H) 03/22/2022   LDLDIRECT 91 07/25/2020   TRIG 77 03/22/2022   CHOLHDL 2.9 03/22/2022   Last hemoglobin A1c Lab Results  Component Value Date   HGBA1C 5.5 03/20/2021   Last thyroid functions Lab Results  Component Value Date   TSH 1.070 03/20/2021   T3TOTAL 112 01/23/2020   Last vitamin D Lab Results  Component Value Date   VD25OH 121.0 (H) 03/22/2022       Objective     Today's Vitals   07/30/22 1537 07/30/22 1619  BP: (Abnormal) 165/98 (Abnormal) 168/98  Pulse: 88 76  Resp: 20   SpO2: 97%   Weight: 138 lb (62.6 kg)   Height: 5' 4.96" (1.65 m)   PainSc: 0-No pain    Body mass index is 22.99 kg/m.  BP Readings from Last 3 Encounters:  07/30/22 (Abnormal) 168/98  03/26/22 138/82  02/27/22 (Abnormal) 163/100    Wt Readings from Last 3 Encounters:  07/30/22 138 lb (62.6 kg)  03/26/22 134 lb (60.8 kg)  02/27/22 132 lb (59.9 kg)     Physical Exam Vitals and nursing note reviewed.  Constitutional:      Appearance: Normal appearance. She is well-developed.  HENT:     Head: Normocephalic and atraumatic.     Right Ear: Tympanic membrane, ear canal and external ear normal.     Left Ear: Tympanic membrane, ear canal and external ear normal.     Nose: Nose normal.     Mouth/Throat:     Mouth: Mucous membranes are moist.     Pharynx: Oropharynx is clear.  Eyes:     Extraocular Movements: Extraocular movements intact.     Conjunctiva/sclera: Conjunctivae normal.     Pupils: Pupils are equal, round, and reactive to light.  Neck:     Vascular: No carotid bruit.  Cardiovascular:     Rate and Rhythm: Normal rate and regular rhythm.     Pulses: Normal pulses.     Heart sounds: Normal heart sounds.  Pulmonary:     Effort: Pulmonary effort  is normal.     Breath sounds: Normal breath sounds.   Abdominal:     General: Bowel sounds are normal. There is no distension.     Palpations: Abdomen is soft. There is no mass.     Tenderness: There is no abdominal tenderness. There is no right CVA tenderness, left CVA tenderness, guarding or rebound.     Hernia: No hernia is present.  Musculoskeletal:        General: Normal range of motion.     Cervical back: Normal range of motion and neck supple.  Lymphadenopathy:     Cervical: No cervical adenopathy.  Skin:    General: Skin is warm and dry.     Capillary Refill: Capillary refill takes less than 2 seconds.  Neurological:     General: No focal deficit present.     Mental Status: She is alert and oriented to person, place, and time.  Psychiatric:        Mood and Affect: Mood normal.        Behavior: Behavior normal.        Thought Content: Thought content normal.        Judgment: Judgment normal.      Last depression screening scores   Row Labels 07/30/2022    3:40 PM 03/26/2022    9:38 AM 02/27/2022   11:07 AM  PHQ 2/9 Scores   Section Header. No data exists in this row.     PHQ - 2 Score   0 0 0  PHQ- 9 Score   0 1    Last fall risk screening   Row Labels 07/30/2022    3:41 PM  [place]. No data exists in this row.   Falls in the past year?   0  Number falls in past yr:   0  Injury with Fall?   0     Assessment & Plan    1. Encounter for general adult medical examination with abnormal findings Annual physical today   2. Primary hypertension Generally stable, however high during office visits. Recommend DASH diet and frequent monitoring.   3. Hyperlipidemia LDL goal <100 Recommend patient limit intake of fried and fatty foods. She should increase intake of lean proteins and green leafy vegetables. Adding exercise into daily routine will also be beneficial.    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 Stable.    Immunization History  Administered Date(s) Administered   Influenza Inj  Mdck Quad Pf 06/03/2022   Influenza,inj,Quad PF,6+ Mos 06/18/2017, 06/19/2018, 05/13/2019   Influenza-Unspecified 05/04/2020, 06/01/2021   PFIZER(Purple Top)SARS-COV-2 Vaccination 11/24/2019, 12/15/2019, 06/19/2020, 01/02/2021, 06/03/2022   Pfizer Covid-19 Vaccine Bivalent Booster 38yr & up 05/17/2021   Tdap 08/24/2012   Zoster Recombinat (Shingrix) 07/25/2020, 11/27/2020    Health Maintenance  Topic Date Due   Hepatitis C Screening  Never done   PAP SMEAR-Modifier  11/27/2021   COVID-19 Vaccine (7 - 2023-24 season) 07/29/2022   DTaP/Tdap/Td (2 - Td or Tdap) 08/24/2022   COLONOSCOPY (Pts 45-425yrInsurance coverage will need to be confirmed)  10/13/2022   MAMMOGRAM  10/24/2022   INFLUENZA VACCINE  Completed   HIV Screening  Completed   Zoster Vaccines- Shingrix  Completed   HPV VACCINES  Aged Out    Discussed health benefits of physical activity, and encouraged her to engage in regular exercise appropriate for her age and condition.  Problem List Items Addressed This Visit  Cardiovascular and Mediastinum   Hypertension (Chronic)     Nervous and Auditory   Cranial nerve VII palsy     Other   Hyperlipidemia LDL goal <100   Other Visit Diagnoses     Encounter for general adult medical examination with abnormal findings    -  Primary        Return in about 6 months (around 01/28/2023) for blood pressure, FBW a week prior to visit.        Ronnell Freshwater, NP  [facility] at [facility] [phone] (phone) [phone] (fax)  [place]

## 2022-08-06 ENCOUNTER — Other Ambulatory Visit: Payer: Self-pay | Admitting: Physical Medicine & Rehabilitation

## 2022-08-06 DIAGNOSIS — H179 Unspecified corneal scar and opacity: Secondary | ICD-10-CM

## 2022-08-06 DIAGNOSIS — G510 Bell's palsy: Secondary | ICD-10-CM

## 2022-09-04 ENCOUNTER — Encounter: Payer: Self-pay | Admitting: Physical Medicine & Rehabilitation

## 2022-09-04 ENCOUNTER — Encounter
Payer: BC Managed Care – PPO | Attending: Physical Medicine & Rehabilitation | Admitting: Physical Medicine & Rehabilitation

## 2022-09-04 VITALS — BP 175/102 | HR 72 | Ht 64.0 in | Wt 141.0 lb

## 2022-09-04 DIAGNOSIS — Z79891 Long term (current) use of opiate analgesic: Secondary | ICD-10-CM | POA: Diagnosis not present

## 2022-09-04 DIAGNOSIS — Z5181 Encounter for therapeutic drug level monitoring: Secondary | ICD-10-CM | POA: Diagnosis not present

## 2022-09-04 DIAGNOSIS — G894 Chronic pain syndrome: Secondary | ICD-10-CM | POA: Diagnosis not present

## 2022-09-04 NOTE — Progress Notes (Signed)
Subjective:    Patient ID: Cristina Conner, female    DOB: [birth year], 62 y.o.   MRN: [id number]  HPI  Cristina Conner is here in follow up of her left VII injury/schwannoma. She follows with an ophthalmologist in [facility] who's trying different contacts to help keep her eye better lubricated. She still has some pain for which she uses tramadol as well as her muscle relaxant. She recently over-scrubbed her left face when she didn't feel the scrubbing pad was abrading her skin.   Balance is still impaired but better. She does well when her energy level is high.    Pain Inventory Average Pain 3 Pain Right Now 2 My pain is sharp, dull, stabbing, and aching  In the last 24 hours, has pain interfered with the following? General activity 1 Relation with others 0 Enjoyment of life 3 What TIME of day is your pain at its worst? evening Sleep (in general) Good  Pain is worse with:  talking Pain improves with: rest and medication Relief from Meds: 7  Family History  Problem Relation Age of Onset   Hypertension Mother    Heart disease Mother    Diabetes Mother    Kidney disease Mother    Hypertension Father    Social History   Socioeconomic History   Marital status: Widowed    Spouse name: Not on file   Number of children: Not on file   Years of education: Not on file   Highest education level: Not on file  Occupational History   Not on file  Tobacco Use   Smoking status: Never   Smokeless tobacco: Never  Vaping Use   Vaping Use: Never used  Substance and Sexual Activity   Alcohol use: Not Currently   Drug use: No   Sexual activity: Not Currently    Partners: Male    Birth control/protection: Post-menopausal    Comment: 1st intercourse- 85, partners- 32  Other Topics Concern   Not on file  Social History Narrative   Widowed in summer 2020   Former smoker no alcohol or drug use   Social Determinants of Radio broadcast assistant Strain: Not on file  Food Insecurity:  Not on file  Transportation Needs: Not on file  Physical Activity: Not on file  Stress: Not on file  Social Connections: Not on file   Past Surgical History:  Procedure Laterality Date   BRAIN SURGERY N/A    Phreesia 07/22/2020   CESAREAN SECTION     x 2   COLONOSCOPY     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   at [place] Left 01/27/2020   Procedure: Left Craniotomy for Tumor Resection;  Surgeon: Judith Part, MD;  Location: [place];  Service: Neurosurgery;  Laterality: Left;   ENDOMETRIAL ABLATION     ESOPHAGOGASTRODUODENOSCOPY  10/2019   LAPAROSCOPY N/A 01/19/2020   Procedure: LAPAROSCOPY DIAGNOSTIC LAPAROTOMY WITH SMALL BOWEL RESECTION;  Surgeon: Ileana Roup, MD;  [facility];  Service: General;  Laterality: N/A;   PELVIC LAPAROSCOPY  1999   left salpingectomy, excision of Right, paratubal cyst   PELVIC LAPAROSCOPY     laparoscopy with lysis of pelvic adhesions   SHOULDER SURGERY  11/2010   "FROZEN SHOULDER"   TOTAL HIP ARTHROPLASTY Left 08/17/2019   Procedure: LEFT TOTAL HIP ARTHROPLASTY ANTERIOR APPROACH;  Surgeon: Melrose Nakayama, MD;  [facility];  Service: Orthopedics;  Laterality: Left;   TUBAL LIGATION     UPPER  GASTROINTESTINAL ENDOSCOPY     VENTRICULOSTOMY Left 01/27/2020   Procedure: VENTRICULOSTOMY;  Surgeon: Judith Part, MD;  Location: [place];  Service: Neurosurgery;  Laterality: Left;   Past Surgical History:  Procedure Laterality Date   BRAIN SURGERY N/A    Phreesia 07/22/2020   CESAREAN SECTION     x 2   COLONOSCOPY     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   at [place] Left 01/27/2020   Procedure: Left Craniotomy for Tumor Resection;  Surgeon: Judith Part, MD;  Location: [place];  Service: Neurosurgery;  Laterality: Left;   ENDOMETRIAL ABLATION     ESOPHAGOGASTRODUODENOSCOPY  10/2019   LAPAROSCOPY N/A 01/19/2020   Procedure: LAPAROSCOPY DIAGNOSTIC LAPAROTOMY WITH SMALL BOWEL RESECTION;   Surgeon: Ileana Roup, MD;  [facility];  Service: General;  Laterality: N/A;   PELVIC LAPAROSCOPY  1999   left salpingectomy, excision of Right, paratubal cyst   PELVIC LAPAROSCOPY     laparoscopy with lysis of pelvic adhesions   SHOULDER SURGERY  11/2010   "FROZEN SHOULDER"   TOTAL HIP ARTHROPLASTY Left 08/17/2019   Procedure: LEFT TOTAL HIP ARTHROPLASTY ANTERIOR APPROACH;  Surgeon: Melrose Nakayama, MD;  [facility];  Service: Orthopedics;  Laterality: Left;   TUBAL LIGATION     UPPER GASTROINTESTINAL ENDOSCOPY     VENTRICULOSTOMY Left 01/27/2020   Procedure: VENTRICULOSTOMY;  Surgeon: Judith Part, MD;  Location: [place];  Service: Neurosurgery;  Laterality: Left;   Past Medical History:  Diagnosis Date   Anxiety    Arthritis    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   Pre-diabetes    LMP 05/01/2009   Opioid Risk Score:   Fall Risk Score:  `1  Depression screen [facility] 2/9     07/30/2022    3:40 PM 03/26/2022    9:38 AM 02/27/2022   11:07 AM 08/29/2021    9:49 AM 07/20/2021   10:37 AM 03/23/2021   10:21 AM 11/29/2020   10:13 AM  Depression screen PHQ 2/9  Decreased Interest 0 0 0 0 0 0 0  Down, Depressed, Hopeless 0 0 0 0 0 0 0  PHQ - 2 Score 0 0 0 0 0 0 0  Altered sleeping 0 1   0 0   Tired, decreased energy 0 0   0 0   Change in appetite 0 0   0 0   Feeling bad or failure about yourself  0 0   0 0   Trouble concentrating 0 0   0 0   Moving slowly or fidgety/restless 0 0   0 1   Suicidal thoughts 0 0   0 0   PHQ-9 Score 0 1   0 1   Difficult doing work/chores Not difficult at all            Review of Systems  Musculoskeletal:        Jaw pain Eye temple pain  All other systems reviewed and are negative.     Objective:   Physical Exam  General: No acute  distress HEENT: NCAT, EOMI, oral membranes moist. No scleral irritation Cards: reg rate  Chest: normal effort Abdomen: Soft, NT, ND Skin: dry, intact Extremities: no edema Psych: pleasant and appropriate  Neuro: closes  eye lid to almost 95%  . Left facial droop, tongue deviation persists but perhaps a little better. Numbness along left hemi-face still present. Balance functional Tandem gait normal.  Strength is grossly 4+ to 5 out of 5 in all 4 limbs.. left hearing loss Musculoskeletal: No focal deficits.  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  Pain: generally much improved.             -May use tramadol for severe pain. No RF today #60             -continue flexeril '5mg'$  prn for spasms-  -UDS today              would beneift from brief breaks/rests where she closes her eyes and relaxes.  3. Left VII nerve injury: lid weight, closure improved- further surgery?.             -continue per ophtho. Local eye care           -eye patch at night might be helpful vs ?swim goggle"  -contacts per ophthamollogy  4. ABLA:   iron/B complex with mulit-vitamin  5. HTN:             pt says bp has been well controlled at home and today's reading is not indicative once again 6. Left hip pain s/p THR 12/20:              -stable 7. Vestibular dysfunction: improved. Occasional symptoms. Coping well.   Twenty minutes of face to face patient care time were spent during this visit. All questions were encouraged and answered.  Follow up with me in 6 mos .

## 2022-09-04 NOTE — Patient Instructions (Signed)
ALWAYS FEEL FREE TO CALL OUR OFFICE WITH ANY PROBLEMS OR QUESTIONS ([phone])  **PLEASE NOTE** ALL MEDICATION REFILL REQUESTS (INCLUDING CONTROLLED SUBSTANCES) NEED TO BE MADE AT LEAST 7 DAYS PRIOR TO REFILL BEING DUE. ANY REFILL REQUESTS INSIDE THAT TIME FRAME MAY RESULT IN DELAYS IN RECEIVING YOUR PRESCRIPTION.                    

## 2022-09-07 LAB — TOXASSURE SELECT,+ANTIDEPR,UR

## 2022-10-25 ENCOUNTER — Encounter: Payer: Self-pay | Admitting: Obstetrics & Gynecology

## 2022-10-26 ENCOUNTER — Other Ambulatory Visit: Payer: Self-pay | Admitting: Physical Medicine & Rehabilitation

## 2022-11-08 ENCOUNTER — Other Ambulatory Visit: Payer: Self-pay | Admitting: Physical Medicine & Rehabilitation

## 2022-11-08 DIAGNOSIS — G510 Bell's palsy: Secondary | ICD-10-CM

## 2022-11-08 DIAGNOSIS — H179 Unspecified corneal scar and opacity: Secondary | ICD-10-CM

## 2022-11-13 ENCOUNTER — Encounter: Payer: Self-pay | Admitting: Internal Medicine

## 2022-11-28 ENCOUNTER — Other Ambulatory Visit: Payer: Self-pay | Admitting: Physical Medicine & Rehabilitation

## 2022-11-28 DIAGNOSIS — G510 Bell's palsy: Secondary | ICD-10-CM

## 2022-11-28 DIAGNOSIS — D333 Benign neoplasm of cranial nerves: Secondary | ICD-10-CM

## 2022-12-26 ENCOUNTER — Ambulatory Visit (INDEPENDENT_AMBULATORY_CARE_PROVIDER_SITE_OTHER): Payer: BC Managed Care – PPO | Admitting: Obstetrics & Gynecology

## 2022-12-26 ENCOUNTER — Encounter: Payer: Self-pay | Admitting: Obstetrics & Gynecology

## 2022-12-26 VITALS — BP 130/90 | HR 80 | Ht 65.25 in | Wt 146.0 lb

## 2022-12-26 DIAGNOSIS — Z780 Asymptomatic menopausal state: Secondary | ICD-10-CM

## 2022-12-26 DIAGNOSIS — M810 Age-related osteoporosis without current pathological fracture: Secondary | ICD-10-CM

## 2022-12-26 DIAGNOSIS — I10 Essential (primary) hypertension: Secondary | ICD-10-CM | POA: Diagnosis not present

## 2022-12-26 DIAGNOSIS — Z01419 Encounter for gynecological examination (general) (routine) without abnormal findings: Secondary | ICD-10-CM

## 2022-12-26 NOTE — Progress Notes (Signed)
Cristina Conner April 28, 1961 [id number]   History:    62 y.o. G2P2L2 Widowed x 03/2019.   RP:  Established patient presenting for annual gyn exam    HPI: Postmenopausal, well on no HRT.  No PMB.  No pelvic pain.  New relationship.  Had dryness/discomfort with IC.  Suggest Astroglide or coconut oil.  May benefit from practicing by herself to strengthen the vaginal skin.  Declines vaginal Estradiol treatment.  Pap 10/2020 Neg.  No h/o abnormal Pap.  Repeat Pap at 3 years. Urine/BMs wnl.  Breasts wnl. Mammo Neg 10/2022. BMI 24.11.  Healthy nutrition.  Health labs with Fam MD. Colono normal 10/2012/Sigmoidoscopy 01/2020.  Will schedule Colono this year.  BD 12/2020 Osteoporosis T-Score -2.7 at the [facility].  Repeat BD 01/2023 at the [facility]. Brain tumor surgery/Cx Facial nerve damage, had Corneal transplant left eye.  cHTN, increased recently.  Past medical history,surgical history, family history and social history were all reviewed and documented in the EPIC chart.  Gynecologic History Patient's last menstrual period was 05/01/2009.  Obstetric History OB History  Gravida Para Term Preterm AB Living  SAB IAB Ectopic Multiple Live Births          2    # Outcome Date GA Lbr Len/2nd Weight Sex Delivery Anes PTL Lv  2 Preterm           1 Term              ROS: A ROS was performed and pertinent positives and negatives are included in the history. GENERAL: No fevers or chills. HEENT: No change in vision, no earache, sore throat or sinus congestion. NECK: No pain or stiffness. CARDIOVASCULAR: No chest pain or pressure. No palpitations. PULMONARY: No shortness of breath, cough or wheeze. GASTROINTESTINAL: No abdominal pain, nausea, vomiting or diarrhea, melena or bright red blood per rectum. GENITOURINARY: No urinary frequency, urgency, hesitancy or dysuria. MUSCULOSKELETAL: No joint or muscle pain, no back pain, no recent trauma. DERMATOLOGIC: No rash, no itching, no lesions. ENDOCRINE:  No polyuria, polydipsia, no heat or cold intolerance. No recent change in weight. HEMATOLOGICAL: No anemia or easy bruising or bleeding. NEUROLOGIC: No headache, seizures, numbness, tingling or weakness. PSYCHIATRIC: No depression, no loss of interest in normal activity or change in sleep pattern.     Exam:   BP (!) 130/90   Pulse 80   Ht 5' 5.25" (1.657 m)   Wt 146 lb (66.2 kg)   LMP 05/01/2009 Comment: not sexually active  SpO2 99%   BMI 24.11 kg/m   Body mass index is 24.11 kg/m.  General appearance : Well developed well nourished female. No acute distress HEENT: Eyes: no retinal hemorrhage or exudates,  Neck supple, trachea midline, no carotid bruits, no thyroidmegaly Lungs: Clear to auscultation, no rhonchi or wheezes, or rib retractions  Heart: Regular rate and rhythm, no murmurs or gallops Breast:Examined in sitting and supine position were symmetrical in appearance, no palpable masses or tenderness,  no skin retraction, no nipple inversion, no nipple discharge, no skin discoloration, no axillary or supraclavicular lymphadenopathy Abdomen: no palpable masses or tenderness, no rebound or guarding Extremities: no edema or skin discoloration or tenderness  Pelvic: Vulva: Normal             Vagina: No gross lesions or discharge  Cervix: No gross lesions or discharge  Uterus  AV, normal size, shape and consistency, non-tender and mobile  Adnexa  Without  masses or tenderness  Anus: Normal   Assessment/Plan:  62 y.o. female for annual exam   1. Well female exam with routine gynecological exam Postmenopausal, well on no HRT.  No PMB.  No pelvic pain.  New relationship.  Had dryness/discomfort with IC.  Suggest Astroglide or coconut oil.  May benefit from practicing by herself to strengthen the vaginal skin.  Declines vaginal Estradiol treatment.  Pap 10/2020 Neg.  No h/o abnormal Pap.  Repeat Pap at 3 years. Urine/BMs wnl. Breasts wnl. Mammo Neg 10/2022. BMI 24.11.  Healthy  nutrition. Health labs with Fam MD. Colono normal 10/2012/Sigmoidoscopy 01/2020.  Will schedule Colono this year.  BD 12/2020 Osteoporosis T-Score -2.7 at the [facility].  Repeat BD 01/2023 at the [facility]. Brain tumor surgery/Cx Facial nerve damage, had Corneal transplant left eye.  cHTN, increased recently.  2. Postmenopausal Postmenopausal, well on no HRT.  No PMB.  No pelvic pain.  New relationship.  Had dryness/discomfort with IC.  Suggest Astroglide or coconut oil.  May benefit from practicing by herself to strengthen the vaginal skin.  Declines vaginal Estradiol treatment.   3. Age-related osteoporosis without current pathological fracture  BD 12/2020 Osteoporosis T-Score -2.7 at the [facility].  [facility] - DG Bone Density; Future  4. Primary hypertension Increased BP recently.  Patient is on Lopressor 25 mg BID.  Will call Fam MD to manage.  Other orders - Docusate Sodium (DSS) 100 MG CAPS; Take by mouth. - UNABLE TO FIND; Med Name: hair growth vitamin   [name] MD, 11:02 AM

## 2022-12-31 ENCOUNTER — Other Ambulatory Visit: Payer: Self-pay | Admitting: Family Medicine

## 2022-12-31 DIAGNOSIS — I10 Essential (primary) hypertension: Secondary | ICD-10-CM

## 2022-12-31 DIAGNOSIS — E785 Hyperlipidemia, unspecified: Secondary | ICD-10-CM

## 2022-12-31 DIAGNOSIS — Z0000 Encounter for general adult medical examination without abnormal findings: Secondary | ICD-10-CM

## 2023-01-09 ENCOUNTER — Encounter: Payer: Self-pay | Admitting: Internal Medicine

## 2023-01-09 ENCOUNTER — Ambulatory Visit: Payer: BC Managed Care – PPO | Admitting: Internal Medicine

## 2023-01-09 VITALS — BP 160/100 | HR 79 | Temp 98.4°F | Ht 65.25 in | Wt 147.0 lb

## 2023-01-09 DIAGNOSIS — D638 Anemia in other chronic diseases classified elsewhere: Secondary | ICD-10-CM

## 2023-01-09 DIAGNOSIS — Z1211 Encounter for screening for malignant neoplasm of colon: Secondary | ICD-10-CM

## 2023-01-09 DIAGNOSIS — Z23 Encounter for immunization: Secondary | ICD-10-CM

## 2023-01-09 DIAGNOSIS — Z1159 Encounter for screening for other viral diseases: Secondary | ICD-10-CM | POA: Diagnosis not present

## 2023-01-09 DIAGNOSIS — R7303 Prediabetes: Secondary | ICD-10-CM | POA: Diagnosis not present

## 2023-01-09 DIAGNOSIS — E785 Hyperlipidemia, unspecified: Secondary | ICD-10-CM | POA: Diagnosis not present

## 2023-01-09 DIAGNOSIS — I10 Essential (primary) hypertension: Secondary | ICD-10-CM

## 2023-01-09 DIAGNOSIS — M8589 Other specified disorders of bone density and structure, multiple sites: Secondary | ICD-10-CM

## 2023-01-09 DIAGNOSIS — E673 Hypervitaminosis D: Secondary | ICD-10-CM

## 2023-01-09 DIAGNOSIS — Z634 Disappearance and death of family member: Secondary | ICD-10-CM

## 2023-01-09 DIAGNOSIS — D333 Benign neoplasm of cranial nerves: Secondary | ICD-10-CM

## 2023-01-09 LAB — CBC
HCT: 40.9 % (ref 36.0–46.0)
Hemoglobin: 13.6 g/dL (ref 12.0–15.0)
MCHC: 33.2 g/dL (ref 30.0–36.0)
MCV: 85.7 fl (ref 78.0–100.0)
Platelets: 312 10*3/uL (ref 150.0–400.0)
RBC: 4.77 Mil/uL (ref 3.87–5.11)
RDW: 14.2 % (ref 11.5–15.5)
WBC: 5.2 10*3/uL (ref 4.0–10.5)

## 2023-01-09 LAB — VITAMIN D 25 HYDROXY (VIT D DEFICIENCY, FRACTURES): VITD: 65.96 ng/mL (ref 30.00–100.00)

## 2023-01-09 LAB — COMPREHENSIVE METABOLIC PANEL
ALT: 19 U/L (ref 0–35)
AST: 19 U/L (ref 0–37)
Albumin: 4.3 g/dL (ref 3.5–5.2)
Alkaline Phosphatase: 69 U/L (ref 39–117)
BUN: 13 mg/dL (ref 6–23)
CO2: 30 mEq/L (ref 19–32)
Calcium: 9.7 mg/dL (ref 8.4–10.5)
Chloride: 102 mEq/L (ref 96–112)
Creatinine, Ser: 0.94 mg/dL (ref 0.40–1.20)
GFR: 65.91 mL/min (ref >60.00)
Glucose, Bld: 95 mg/dL (ref 70–99)
Potassium: 4.3 mEq/L (ref 3.5–5.1)
Sodium: 138 mEq/L (ref 135–145)
Total Bilirubin: 0.4 mg/dL (ref 0.2–1.2)
Total Protein: 7.5 g/dL (ref 6.0–8.3)

## 2023-01-09 LAB — LIPID PANEL
Cholesterol: 191 mg/dL (ref 0–200)
HDL: 68.3 mg/dL (ref >39.00)
LDL Cholesterol: 108 mg/dL — ABNORMAL HIGH (ref 0–99)
NonHDL: 122.86
Total CHOL/HDL Ratio: 3
Triglycerides: 74 mg/dL (ref 0.0–149.0)
VLDL: 14.8 mg/dL (ref 0.0–40.0)

## 2023-01-09 LAB — HEMOGLOBIN A1C: Hgb A1c MFr Bld: 5.9 % (ref 4.6–6.5)

## 2023-01-09 MED ORDER — METOPROLOL SUCCINATE ER 25 MG PO TB24: 25.0000 mg | ORAL_TABLET | Freq: Every day | ORAL | 3 refills | Status: DC

## 2023-01-09 NOTE — Progress Notes (Signed)
   Subjective:   Patient ID: Cristina Conner, female    DOB: 08-01-1961, 62 y.o.   MRN: [id number]  HPI The patient is a new 62 YO female coming in for concerns see A/P for details.   PMH, [facility], social history reviewed and updated  Review of Systems  Constitutional: Negative.   HENT: Negative.    Eyes:  Positive for visual disturbance.  Respiratory:  Negative for cough, chest tightness and shortness of breath.   Cardiovascular:  Negative for chest pain, palpitations and leg swelling.  Gastrointestinal:  Negative for abdominal distention, abdominal pain, constipation, diarrhea, nausea and vomiting.  Musculoskeletal: Negative.   Skin: Negative.   Neurological: Negative.   Psychiatric/Behavioral: Negative.      Objective:  Physical Exam Constitutional:      Appearance: She is well-developed.  HENT:     Head: Normocephalic and atraumatic.  Cardiovascular:     Rate and Rhythm: Normal rate and regular rhythm.  Pulmonary:     Effort: Pulmonary effort is normal. No respiratory distress.     Breath sounds: Normal breath sounds. No wheezing or rales.  Abdominal:     General: Bowel sounds are normal. There is no distension.     Palpations: Abdomen is soft.     Tenderness: There is no abdominal tenderness. There is no rebound.  Musculoskeletal:     Cervical back: Normal range of motion.  Skin:    General: Skin is warm and dry.  Neurological:     Mental Status: She is alert and oriented to person, place, and time.     Coordination: Coordination normal.     Comments: Left face with changes post-op, left eye with visual changes, left ear no hearing     Vitals:   01/09/23 1032 01/09/23 1037  BP: (!) 174/95 (!) 160/100  Pulse: 79   Temp: 98.4 F (36.9 C)   TempSrc: Oral   SpO2: 99%   Weight: 147 lb (66.7 kg)   Height: 5' 5.25" (1.657 m)     Assessment & Plan:  Tdap given at visit

## 2023-01-09 NOTE — Patient Instructions (Addendum)
Check the sodium in your diet and if above 5000 mg sodium daily try to reduce. If below 5000 mg sodium  We have sent in metoprolol to take 1 pill daily.   We have given you the tetanus today

## 2023-01-10 LAB — HEPATITIS C ANTIBODY: Hepatitis C Ab: NONREACTIVE

## 2023-01-10 NOTE — Assessment & Plan Note (Signed)
S/P surgery and under surveillance still. 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

## 2023-01-10 NOTE — Assessment & Plan Note (Signed)
BP elevated today but has white coat hypertension. She has home readings with average 140/85. We will change metoprolol to 25 mg daily succinate version to see if this gives more consistent readings. We discussed if BP is still borderline at home plan to increase to 50 mg daily succinate metoprolol. She will let us know readings in 2-3 weeks. Checking CMP

## 2023-01-10 NOTE — Assessment & Plan Note (Signed)
Up-to-date on DEXA

## 2023-01-10 NOTE — Assessment & Plan Note (Signed)
Several family members have passed away including husband and parents in the last few years. She is having a lot of anniversaries this time of year. Is in counseling and denies need for additional treatment at this time.

## 2023-01-10 NOTE — Assessment & Plan Note (Signed)
Checking CBC for stability. No clinical signs of GI bleeding.

## 2023-01-10 NOTE — Assessment & Plan Note (Signed)
Checking lipid panel and adjust as needed. Currently managed by diet is unable to exercise as intensely due to brain surgery due to tumor and new balance problems since that time.

## 2023-01-10 NOTE — Assessment & Plan Note (Signed)
Checking HgA1c and adjust as needed.  

## 2023-01-20 ENCOUNTER — Other Ambulatory Visit: Payer: BC Managed Care – PPO

## 2023-01-28 ENCOUNTER — Ambulatory Visit: Payer: BC Managed Care – PPO | Admitting: Family Medicine

## 2023-02-04 ENCOUNTER — Telehealth: Payer: Self-pay | Admitting: Internal Medicine

## 2023-02-04 NOTE — Telephone Encounter (Signed)
Good Morning Dr.Pyrtle  Supervising MD for June 4 -AM  We received a call from this patient wishing  to switch care over to a different provider for colonoscopy procedure. She is Dr.Gessner patient but she stated she do not want to continue care with him, patient do not have a specific provider she is wanting to transfer to. Will you please review and advise [place] on how to proceed?

## 2023-02-04 NOTE — Telephone Encounter (Signed)
Dr. Leone Payor, any potential insight here?

## 2023-02-04 NOTE — Telephone Encounter (Signed)
The last time I saw her we were dealing with a retained capsule endoscope that had to be removed surgically.

## 2023-02-05 NOTE — Telephone Encounter (Signed)
Ok to transfer [employer]

## 2023-02-06 ENCOUNTER — Other Ambulatory Visit: Payer: Self-pay | Admitting: Physical Medicine & Rehabilitation

## 2023-02-06 ENCOUNTER — Encounter: Payer: Self-pay | Admitting: Internal Medicine

## 2023-02-06 DIAGNOSIS — G510 Bell's palsy: Secondary | ICD-10-CM

## 2023-02-06 DIAGNOSIS — H179 Unspecified corneal scar and opacity: Secondary | ICD-10-CM

## 2023-03-05 ENCOUNTER — Encounter: Payer: Self-pay | Admitting: Physical Medicine & Rehabilitation

## 2023-03-05 ENCOUNTER — Encounter
Payer: BC Managed Care – PPO | Attending: Physical Medicine & Rehabilitation | Admitting: Physical Medicine & Rehabilitation

## 2023-03-05 VITALS — BP 162/93 | HR 101 | Ht 60.25 in | Wt 150.8 lb

## 2023-03-05 DIAGNOSIS — G510 Bell's palsy: Secondary | ICD-10-CM | POA: Insufficient documentation

## 2023-03-05 DIAGNOSIS — D333 Benign neoplasm of cranial nerves: Secondary | ICD-10-CM | POA: Insufficient documentation

## 2023-03-05 MED ORDER — METOPROLOL SUCCINATE ER 50 MG PO TB24: 50.0000 mg | ORAL_TABLET | Freq: Every day | ORAL | 3 refills | Status: DC

## 2023-03-05 NOTE — Progress Notes (Signed)
Subjective:    Patient ID: Cristina Conner, female    DOB: Mar 30, 1961, 62 y.o.   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 She has seen ophthalmology who placed a lens with amniotic fluid which is supposed to protect her lens. The eye is feeling better but vision is cloudy thru lens. Balance is still an issue as is her hearing.   Overall, her pain is stable to improved. She is using tramadol twice daily. She use flexeril at night.   Her BP has been a bit elevated. Her pcp is following this but apparently decreased her from 25mg  bid an put her on 25mg  xl daily.   Pain Inventory Average Pain 2 Pain Right Now 0 My pain is intermittent, aching, and pressure  In the last 24 hours, has pain interfered with the following? General activity 1 Relation with others 0 Enjoyment of life 1 What TIME of day is your pain at its worst? evening Sleep (in general) Good  Pain is worse with: some activites Pain improves with: medication Relief from Meds: 9  Family History  Problem Relation Age of Onset   Hypertension Mother    Heart disease Mother    Diabetes Mother    Kidney disease Mother    Hypertension Father    Social History   Socioeconomic History   Marital status: Widowed    Spouse name: Not on file   Number of children: Not on file   Years of education: Not on file   Highest education level: Not on file  Occupational History   Not on file  Tobacco Use   Smoking status: Never   Smokeless tobacco: Never  Vaping Use   Vaping Use: Never used  Substance and Sexual Activity   Alcohol use: Not Currently   Drug use: No   Sexual activity: Not Currently    Partners: Male    Birth control/protection: Post-menopausal    Comment: 1st intercourse- 26, partners- 4  Other Topics Concern   Not on file  Social History Narrative   Widowed  in summer 2020   Former smoker no alcohol or drug use   Social Determinants of Corporate investment banker Strain: Not on file  Food Insecurity: Not on file  Transportation Needs: Not on file  Physical Activity: Not on file  Stress: Not on file  Social Connections: Not on file   Past Surgical History:  Procedure Laterality Date   BRAIN SURGERY N/A    Phreesia 07/22/2020   CESAREAN SECTION     x 2   COLONOSCOPY     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   at [facility]   CRANIOTOMY Left 01/27/2020   Procedure: Left Craniotomy for Tumor Resection;  Surgeon: Jadene Pierini, MD;  Location: [facility] OR;  Service: Neurosurgery;  Laterality: Left;   ENDOMETRIAL ABLATION     ESOPHAGOGASTRODUODENOSCOPY  10/2019   LAPAROSCOPY N/A 01/19/2020   Procedure: LAPAROSCOPY DIAGNOSTIC LAPAROTOMY WITH SMALL BOWEL RESECTION;  Surgeon: Andria Meuse, MD;  Location: [facility] ORS;  Service: General;  Laterality: N/A;   PELVIC LAPAROSCOPY  1999   left salpingectomy, excision of Right, paratubal cyst   PELVIC LAPAROSCOPY     laparoscopy with lysis of pelvic adhesions   SHOULDER SURGERY  11/2010   "FROZEN SHOULDER"   TOTAL HIP ARTHROPLASTY Left 08/17/2019  Procedure: LEFT TOTAL HIP ARTHROPLASTY ANTERIOR APPROACH;  Surgeon: Marcene Corning, MD;  Location: [facility];  Service: Orthopedics;  Laterality: Left;   TUBAL LIGATION     UPPER GASTROINTESTINAL ENDOSCOPY     VENTRICULOSTOMY Left 01/27/2020   Procedure: VENTRICULOSTOMY;  Surgeon: Jadene Pierini, MD;  Location: [facility] OR;  Service: Neurosurgery;  Laterality: Left;   Past Surgical History:  Procedure Laterality Date   BRAIN SURGERY N/A    Phreesia 07/22/2020   CESAREAN SECTION     x 2   COLONOSCOPY     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   at [facility]   CRANIOTOMY Left 01/27/2020   Procedure: Left Craniotomy for Tumor Resection;  Surgeon: Jadene Pierini, MD;  Location[facility];  Service: Neurosurgery;  Laterality: Left;   ENDOMETRIAL ABLATION      ESOPHAGOGASTRODUODENOSCOPY  10/2019   LAPAROSCOPY N/A 01/19/2020   Procedure: LAPAROSCOPY DIAGNOSTIC LAPAROTOMY WITH SMALL BOWEL RESECTION;  Surgeon: Andria Meuse, MD;  Location: [facility];  Service: General;  Laterality: N/A;   PELVIC LAPAROSCOPY  1999   left salpingectomy, excision of Right, paratubal cyst   PELVIC LAPAROSCOPY     laparoscopy with lysis of pelvic adhesions   SHOULDER SURGERY  11/2010   "FROZEN SHOULDER"   TOTAL HIP ARTHROPLASTY Left 08/17/2019   Procedure: LEFT TOTAL HIP ARTHROPLASTY ANTERIOR APPROACH;  Surgeon: Marcene Corning, MD;  Location: [facility];  Service: Orthopedics;  Laterality: Left;   TUBAL LIGATION     UPPER GASTROINTESTINAL ENDOSCOPY     VENTRICULOSTOMY Left 01/27/2020   Procedure: VENTRICULOSTOMY;  Surgeon: Jadene Pierini, MD;  Location: [facility] OR;  Service: Neurosurgery;  Laterality: Left;   Past Medical History:  Diagnosis Date   Anxiety    Arthritis    Brain tumor (HCC)    vestibular neuroma   Diverticulosis    GERD (gastroesophageal reflux disease)    Hypertension    Osteopenia    Post-operative nausea and vomiting    vomiting after general anesthesia per pt.   Pre-diabetes    BP (!) 162/93   Pulse (!) 101   Ht 5' 0.25" (1.53 m)   Wt 150 lb 12.8 oz (68.4 kg)   LMP 05/01/2009 Comment: not sexually active  SpO2 98%   BMI 29.21 kg/m   Opioid Risk Score:   Fall Risk Score:  `1  Depression screen [facility] 2/9     01/09/2023   10:34 AM 09/04/2022   10:41 AM 07/30/2022    3:40 PM 03/26/2022    9:38 AM 02/27/2022   11:07 AM 08/29/2021    9:49 AM 07/20/2021   10:37 AM  Depression screen PHQ 2/9  Decreased Interest 0 0 0 0 0 0 0  Down, Depressed, Hopeless 0 0 0 0 0 0 0  PHQ - 2 Score 0 0 0 0 0 0 0  Altered sleeping 0  0 1   0  Tired, decreased energy 0  0 0   0  Change in appetite 0  0 0   0  Feeling bad or failure about yourself  0  0 0   0  Trouble concentrating 0  0 0   0  Moving slowly or fidgety/restless 0  0 0   0  Suicidal  thoughts 0  0 0   0  PHQ-9 Score 0  0 1   0  Difficult doing work/chores Not difficult at all  Not difficult at all         Review  of Systems  Neurological:  Positive for headaches.  All other systems reviewed and are negative.     Objective:   Physical Exam  General: No acute distress HEENT: NCAT, EOMI, oral membranes moist. Left eye with contact, only minimally irritated on inspection Cards: reg rate  Chest: normal effort Abdomen: Soft, NT, ND Skin: dry, intact Extremities: no edema Psych: pleasant and appropriate  Neuro: closes eye lid to almost 95%  . Left facial droop, tongue deviation stable. Numbness along left hemi-face still present. Balance functional/fair.  Strength is grossly 4+ to 5 out of 5 in all 4 limbs.. left sided hearing loss Musculoskeletal: No focal deficits.  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  Pain: generally much improved.             -May use tramadol for severe pain. 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             -continue per ophtho. Local eye care           -discussed swim goggle for night time             -contacts per ophthamollogy  4. ABLA:   iron/B complex with mulit-vitamin  5. HTN:             -increase metoprolol xl to 50mg  daily  -if no improvement follow up with primary 6. Left hip pain s/p THR 12/20:              -stable 7. Vestibular dysfunction: improved. Occasional symptoms. Coping well.    20+ minutes of face to face patient care time were spent during this visit. All questions were encouraged and  answered.  Follow up with me in 6 mos .

## 2023-03-27 ENCOUNTER — Encounter: Payer: Self-pay | Admitting: Internal Medicine

## 2023-05-08 ENCOUNTER — Other Ambulatory Visit: Payer: Self-pay | Admitting: Physical Medicine & Rehabilitation

## 2023-05-08 DIAGNOSIS — H179 Unspecified corneal scar and opacity: Secondary | ICD-10-CM

## 2023-05-08 DIAGNOSIS — G510 Bell's palsy: Secondary | ICD-10-CM

## 2023-05-08 DIAGNOSIS — D333 Benign neoplasm of cranial nerves: Secondary | ICD-10-CM

## 2023-05-21 ENCOUNTER — Encounter: Payer: Self-pay | Admitting: Internal Medicine

## 2023-05-21 ENCOUNTER — Ambulatory Visit (AMBULATORY_SURGERY_CENTER): Payer: BC Managed Care – PPO

## 2023-05-21 VITALS — Ht 66.0 in | Wt 148.0 lb

## 2023-05-21 DIAGNOSIS — Z1211 Encounter for screening for malignant neoplasm of colon: Secondary | ICD-10-CM

## 2023-05-21 MED ORDER — NA SULFATE-K SULFATE-MG SULF 17.5-3.13-1.6 GM/177ML PO SOLN
1.0000 | Freq: Once | ORAL | 0 refills | Status: AC
Start: 2023-05-21 — End: 2023-05-21

## 2023-05-21 NOTE — Progress Notes (Signed)

## 2023-06-02 ENCOUNTER — Encounter: Payer: Self-pay | Admitting: Internal Medicine

## 2023-06-10 ENCOUNTER — Ambulatory Visit (AMBULATORY_SURGERY_CENTER): Payer: BC Managed Care – PPO | Admitting: Internal Medicine

## 2023-06-10 ENCOUNTER — Encounter: Payer: Self-pay | Admitting: Internal Medicine

## 2023-06-10 VITALS — BP 161/78 | HR 60 | Temp 97.5°F | Resp 16 | Ht 66.0 in | Wt 148.0 lb

## 2023-06-10 DIAGNOSIS — Z1211 Encounter for screening for malignant neoplasm of colon: Secondary | ICD-10-CM

## 2023-06-10 MED ORDER — SODIUM CHLORIDE 0.9 % IV SOLN: 500.0000 mL | Freq: Once | INTRAVENOUS | Status: DC

## 2023-06-10 NOTE — Progress Notes (Signed)
Pt reports no new medical changes since last pre-visit.

## 2023-06-10 NOTE — Progress Notes (Signed)
GASTROENTEROLOGY PROCEDURE H&P NOTE   Primary Care Physician: Myrlene Broker, MD    Reason for Procedure:  Colon cancer screening  Plan:    Colonoscopy  Patient is appropriate for endoscopic procedure(s) in the ambulatory ([facility]) setting.  The nature of the procedure, as well as the risks, benefits, and alternatives were carefully and thoroughly reviewed with the patient. Ample time for discussion and questions allowed. The patient understood, was satisfied, and agreed to proceed.     HPI: Cristina Conner is a 62 y.o. female who presents for colonoscopy.  Medical history as below.  Tolerated the prep.  No recent chest pain or shortness of breath.  No abdominal pain today.  Past Medical History:  Diagnosis Date   Anxiety    Brain tumor (HCC)    vestibular neuroma   Diverticulosis    Hypertension    Osteopenia    Osteoporosis    Post-operative nausea and vomiting    vomiting after general anesthesia per pt.   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   at [facility]   CRANIOTOMY Left 01/27/2020   Procedure: Left Craniotomy for Tumor Resection;  Surgeon: Jadene Pierini, MD;  Location: [facility] OR;  Service: Neurosurgery;  Laterality: Left;   ENDOMETRIAL ABLATION     ESOPHAGOGASTRODUODENOSCOPY  10/2019   LAPAROSCOPY N/A 01/19/2020   Procedure: LAPAROSCOPY DIAGNOSTIC LAPAROTOMY WITH SMALL BOWEL RESECTION;  Surgeon: Andria Meuse, MD;  Location: [facility];  Service: General;  Laterality: N/A;   PELVIC LAPAROSCOPY  1999   left salpingectomy, excision of Right, paratubal cyst   PELVIC LAPAROSCOPY     laparoscopy with lysis of pelvic adhesions   SHOULDER SURGERY  11/2010   "FROZEN SHOULDER"   TOTAL HIP ARTHROPLASTY Left 08/17/2019   Procedure: LEFT TOTAL HIP ARTHROPLASTY ANTERIOR APPROACH;  Surgeon: Marcene Corning, MD;  Location: [facility];  Service: Orthopedics;  Laterality: Left;   TUBAL LIGATION     UPPER GASTROINTESTINAL ENDOSCOPY     VENTRICULOSTOMY Left 01/27/2020   Procedure: VENTRICULOSTOMY;  Surgeon: Jadene Pierini, MD;  Location: [facility] OR;  Service: Neurosurgery;  Laterality: Left;    Prior to Admission medications   Medication Sig Start Date End Date Taking? Authorizing Provider  acetaminophen (TYLENOL) 500 MG tablet Take 1-2 tablets (500-1,000 mg total) by mouth every 4 (four) hours as needed for moderate pain. 02/09/20  Yes Love, Evlyn Kanner, PA-C  calcium carbonate (OSCAL) 1500 (600 Ca) MG TABS tablet Take by mouth daily.   Yes [provider]  cyclobenzaprine (FLEXERIL) 5 MG tablet TAKE 1 TABLET BY MOUTH THREE TIMES A DAY AS NEEDED FOR MUSCLE SPASM 05/09/23  Yes Ranelle Oyster, MD  Docusate Sodium (DSS) 100 MG CAPS Take by mouth. 09/18/21  Yes [provider]  metoprolol succinate (TOPROL-XL) 50 MG 24 hr tablet TAKE 1 TABLET BY MOUTH EVERY DAY 05/09/23  Yes Ranelle Oyster, MD  Misc Natural Products (BEET ROOT PO) Take by mouth.   Yes [provider]  moxifloxacin (VIGAMOX) 0.5 % ophthalmic solution Place 1 drop into the left eye 3 (three) times daily.   Yes [provider]  Multiple Vitamin (MULTIVITAMIN) tablet Take 1 tablet by mouth daily.   Yes [provider]  prednisoLONE acetate (PRED FORTE) 1 %  ophthalmic suspension Place 1 drop into the left eye every morning. 11/23/21  Yes [provider]  traMADol (ULTRAM) 50 MG tablet TAKE 1 TABLET BY MOUTH EVERY 12 HOURS AS NEEDED 05/09/23  Yes Ranelle Oyster, MD  UNABLE TO FIND Med Name: hair growth vitamin    [provider]    Current Outpatient Medications  Medication Sig Dispense Refill   acetaminophen (TYLENOL) 500 MG tablet Take 1-2 tablets (500-1,000 mg total) by mouth every 4 (four) hours as needed for moderate pain. 30 tablet 0   calcium carbonate (OSCAL) 1500 (600 Ca)  MG TABS tablet Take by mouth daily.     cyclobenzaprine (FLEXERIL) 5 MG tablet TAKE 1 TABLET BY MOUTH THREE TIMES A DAY AS NEEDED FOR MUSCLE SPASM 30 tablet 3   Docusate Sodium (DSS) 100 MG CAPS Take by mouth.     metoprolol succinate (TOPROL-XL) 50 MG 24 hr tablet TAKE 1 TABLET BY MOUTH EVERY DAY 90 tablet 1   Misc Natural Products (BEET ROOT PO) Take by mouth.     moxifloxacin (VIGAMOX) 0.5 % ophthalmic solution Place 1 drop into the left eye 3 (three) times daily.     Multiple Vitamin (MULTIVITAMIN) tablet Take 1 tablet by mouth daily.     prednisoLONE acetate (PRED FORTE) 1 % ophthalmic suspension Place 1 drop into the left eye every morning.     traMADol (ULTRAM) 50 MG tablet TAKE 1 TABLET BY MOUTH EVERY 12 HOURS AS NEEDED 60 tablet 2   UNABLE TO FIND Med Name: hair growth vitamin     Current Facility-Administered Medications  Medication Dose Route Frequency Provider Last Rate Last Admin   0.9 %  sodium chloride infusion  500 mL Intravenous Once Gerrianne Aydelott, Carie Caddy, MD        Allergies as of 06/10/2023 - Review Complete 06/10/2023  Allergen Reaction Noted   Hydrocodone Nausea And Vomiting 09/20/2019    Family History  Problem Relation Age of Onset   Hypertension Mother    Heart disease Mother    Diabetes Mother    Kidney disease Mother    Hypertension Father    Colon cancer Neg Hx    Colon polyps Neg Hx    Esophageal cancer Neg Hx    Stomach cancer Neg Hx    Rectal cancer Neg Hx     Social History   Socioeconomic History   Marital status: Widowed    Spouse name: Not on file   Number of children: Not on file   Years of education: Not on file   Highest education level: Not on file  Occupational History   Not on file  Tobacco Use   Smoking status: Never   Smokeless tobacco: Never  Vaping Use   Vaping status: Never Used  Substance and Sexual Activity   Alcohol use: Not Currently   Drug use: No   Sexual activity: Not Currently    Partners: Male    Birth  control/protection: Post-menopausal    Comment: 1st intercourse- 34, partners- 4  Other Topics Concern   Not on file  Social History Narrative   Widowed in summer 2020   Former smoker no alcohol or drug use   Social Determinants of Corporate investment banker Strain: Not on file  Food Insecurity: Not on file  Transportation Needs: Not on file  Physical Activity: Not on file  Stress: Not on file  Social Connections: Not on file  Intimate Partner Violence: Not on file  Physical Exam: Vital signs in last 24 hours: @BP  (!) 188/102   Pulse 81   Temp (!) 97.5 F (36.4 C) (Temporal)   Ht 5\' 6"  (1.676 m)   Wt 148 lb (67.1 kg)   LMP 05/01/2009 Comment: not sexually active  SpO2 99%   BMI 23.89 kg/m  GEN: NAD EYE: Sclerae anicteric ENT: MMM CV: Non-tachycardic Pulm: CTA b/l GI: Soft, NT/ND NEURO:  Alert & Oriented x 3   Erick Blinks, MD [facility]  06/10/2023 11:29 AM

## 2023-06-10 NOTE — Patient Instructions (Signed)
YOU HAD AN ENDOSCOPIC PROCEDURE TODAY AT THE [facility]:   Refer to the procedure report that was given to you for any specific questions about what was found during the examination.  If the procedure report does not answer your questions, please call your gastroenterologist to clarify.  If you requested that your care partner not be given the details of your procedure findings, then the procedure report has been included in a sealed envelope for you to review at your convenience later.  **handout given on diverticulosis**  YOU SHOULD EXPECT: Some feelings of bloating in the abdomen. Passage of more gas than usual.  Walking can help get rid of the air that was put into your GI tract during the procedure and reduce the bloating. If you had a lower endoscopy (such as a colonoscopy or flexible sigmoidoscopy) you may notice spotting of blood in your stool or on the toilet paper. If you underwent a bowel prep for your procedure, you may not have a normal bowel movement for a few days.  Please Note:  You might notice some irritation and congestion in your nose or some drainage.  This is from the oxygen used during your procedure.  There is no need for concern and it should clear up in a day or so.  SYMPTOMS TO REPORT IMMEDIATELY:  Following lower endoscopy (colonoscopy or flexible sigmoidoscopy):  Excessive amounts of blood in the stool  Significant tenderness or worsening of abdominal pains  Swelling of the abdomen that is new, acute  Fever of 100F or higher  For urgent or emergent issues, a gastroenterologist can be reached at any hour by calling [phone]. Do not use MyChart messaging for urgent concerns.    DIET:  We do recommend a small meal at first, but then you may proceed to your regular diet.  Drink plenty of fluids but you should avoid alcoholic beverages for 24 hours.  ACTIVITY:  You should plan to take it easy for the rest of today and you should NOT DRIVE or use  heavy machinery until tomorrow (because of the sedation medicines used during the test).    FOLLOW UP: Our staff will call the number listed on your records the next business day following your procedure.  We will call around 7:15- 8:00 am to check on you and address any questions or concerns that you may have regarding the information given to you following your procedure. If we do not reach you, we will leave a message.     If any biopsies were taken you will be contacted by phone or by letter within the next 1-3 weeks.  Please call us at [phone] if you have not heard about the biopsies in 3 weeks.    SIGNATURES/CONFIDENTIALITY: You and/or your care partner have signed paperwork which will be entered into your electronic medical record.  These signatures attest to the fact that that the information above on your After Visit Summary has been reviewed and is understood.  Full responsibility of the confidentiality of this discharge information lies with you and/or your care-partner.

## 2023-06-10 NOTE — Op Note (Signed)
[facility] Patient Name: Cristina Conner Procedure Date: 06/10/2023 11:26 AM MRN: [id number] Endoscopist: Beverley Fiedler , MD, [id number] Age: 62 Referring MD:  Date of Birth: 10/14/1960 Gender: Female Account #: [id number] Procedure:                Colonoscopy Indications:              Screening for colorectal malignant neoplasm, Last                            colonoscopy 10 years ago Medicines:                Monitored Anesthesia Care Procedure:                Pre-Anesthesia Assessment:                           - Prior to the procedure, a History and Physical                            was performed, and patient medications and                            allergies were reviewed. The patient's tolerance of                            previous anesthesia was also reviewed. The risks                            and benefits of the procedure and the sedation                            options and risks were discussed with the patient.                            All questions were answered, and informed consent                            was obtained. Prior Anticoagulants: The patient has                            taken no anticoagulant or antiplatelet agents. ASA                            Grade Assessment: II - A patient with mild systemic                            disease. After reviewing the risks and benefits,                            the patient was deemed in satisfactory condition to                            undergo the procedure.  After obtaining informed consent, the colonoscope                            was passed under direct vision. Throughout the                            procedure, the patient's blood pressure, pulse, and                            oxygen saturations were monitored continuously. The                            Olympus PCF-H190DL (#[id number]) Colonoscope was                            introduced through the anus and  advanced to the                            terminal ileum. The colonoscopy was performed                            without difficulty. The patient tolerated the                            procedure well. The quality of the bowel                            preparation was good. The terminal ileum, ileocecal                            valve, appendiceal orifice, and rectum were                            photographed. Scope In: 11:36:22 AM Scope Out: 11:49:52 AM Scope Withdrawal Time: 0 hours 9 minutes 14 seconds  Total Procedure Duration: 0 hours 13 minutes 30 seconds  Findings:                 The digital rectal exam was normal.                           The terminal ileum contained a single small                            diverticulum.                           A few small-mouthed diverticula were found in the                            descending colon, transverse colon and ascending                            colon.  The exam was otherwise without abnormality on                            direct and retroflexion views. Complications:            No immediate complications. Estimated Blood Loss:     Estimated blood loss: none. Impression:               - Ileal diverticulum. Normal examined ileal mucosa.                           - Mild, scattered, diverticulosis in the descending                            colon, in the transverse colon and in the ascending                            colon.                           - The examination was otherwise normal on direct                            and retroflexion views.                           - No specimens collected. Recommendation:           - Patient has a contact number available for                            emergencies. The signs and symptoms of potential                            delayed complications were discussed with the                            patient. Return to normal activities tomorrow.                             Written discharge instructions were provided to the                            patient.                           - Resume previous diet.                           - Continue present medications.                           - Repeat colonoscopy in 10 years for screening                            purposes. Beverley Fiedler, MD 06/10/2023 11:52:42 AM This report has been signed electronically.

## 2023-06-10 NOTE — Progress Notes (Signed)
Report to PACU, RN, vss, BBS= Clear.  

## 2023-06-11 ENCOUNTER — Telehealth: Payer: Self-pay | Admitting: *Deleted

## 2023-06-11 NOTE — Telephone Encounter (Signed)
  Follow up Call-     06/10/2023   10:59 AM  Call back number  Post procedure Call Back phone  # [phone]  Permission to leave phone message Yes     Patient questions:  Do you have a fever, pain , or abdominal swelling? No. Pain Score  0 *  Have you tolerated food without any problems? Yes.    Have you been able to return to your normal activities? Yes.    Do you have any questions about your discharge instructions: Diet   No. Medications  No. Follow up visit  No.  Do you have questions or concerns about your Care? No.  Actions: * If pain score is 4 or above: No action needed, pain <4.

## 2023-07-09 ENCOUNTER — Encounter: Payer: Self-pay | Admitting: Internal Medicine

## 2023-07-09 ENCOUNTER — Ambulatory Visit: Payer: BC Managed Care – PPO | Admitting: Internal Medicine

## 2023-07-09 VITALS — BP 158/100 | HR 80 | Temp 97.9°F | Ht 66.0 in | Wt 155.0 lb

## 2023-07-09 DIAGNOSIS — R7303 Prediabetes: Secondary | ICD-10-CM

## 2023-07-09 DIAGNOSIS — I10 Essential (primary) hypertension: Secondary | ICD-10-CM

## 2023-07-09 LAB — POCT GLYCOSYLATED HEMOGLOBIN (HGB A1C): HbA1c POC (<> result, manual entry): 5.6 % (ref 4.0–5.6)

## 2023-07-09 MED ORDER — METOPROLOL SUCCINATE ER 50 MG PO TB24: 50.0000 mg | ORAL_TABLET | Freq: Two times a day (BID) | ORAL | 3 refills | Status: DC

## 2023-07-09 NOTE — Progress Notes (Signed)
   Subjective:   Patient ID: Cristina Conner, female    DOB: 11/08/1960, 62 y.o.   MRN: [id number]  HPI The patient is a 62 YO female coming in for follow up pre-diabetes. BP is doing better with increase in metoprolol to 50 mg BID and has been on this a week or so.  Review of Systems  Constitutional: Negative.   HENT: Negative.    Eyes:  Positive for visual disturbance.  Respiratory:  Negative for cough, chest tightness and shortness of breath.   Cardiovascular:  Negative for chest pain, palpitations and leg swelling.  Gastrointestinal:  Negative for abdominal distention, abdominal pain, constipation, diarrhea, nausea and vomiting.  Musculoskeletal: Negative.   Skin: Negative.   Neurological: Negative.        Balance change  Psychiatric/Behavioral: Negative.      Objective:  Physical Exam Constitutional:      Appearance: She is well-developed.  HENT:     Head: Normocephalic and atraumatic.  Cardiovascular:     Rate and Rhythm: Normal rate and regular rhythm.  Pulmonary:     Effort: Pulmonary effort is normal. No respiratory distress.     Breath sounds: Normal breath sounds. No wheezing or rales.  Abdominal:     General: Bowel sounds are normal. There is no distension.     Palpations: Abdomen is soft.     Tenderness: There is no abdominal tenderness. There is no rebound.  Musculoskeletal:     Cervical back: Normal range of motion.  Skin:    General: Skin is warm and dry.  Neurological:     Mental Status: She is alert and oriented to person, place, and time.     Coordination: Coordination normal.     Vitals:   07/09/23 1003 07/09/23 1005  BP: (!) 158/100 (!) 158/100  Pulse: 80   Temp: 97.9 F (36.6 C)   TempSrc: Oral   SpO2: 99%   Weight: 155 lb (70.3 kg)   Height: 5\' 6"  (1.676 m)     Assessment & Plan:

## 2023-07-09 NOTE — Assessment & Plan Note (Signed)
POC Hga1c done today and in normal range. Will continue to follow up in 6-12 months for monitoring until consistently normal. Counseled about diet and she is working to get back into exercise had been limited by balance after surgery for schwannoma.

## 2023-07-09 NOTE — Assessment & Plan Note (Signed)
BP was mildly above goal and she made change to metoprolol 50 mg BID. HR acceptable today. New rx done to reflect new dosing. Home BP readings are improving and at goal on this new medication. She does have white coat hypertension and typically has elevated levels in office.

## 2023-07-09 NOTE — Patient Instructions (Signed)
Your sugars are better and now normal at 5.6

## 2023-07-10 ENCOUNTER — Other Ambulatory Visit: Payer: Self-pay | Admitting: Internal Medicine

## 2023-07-10 ENCOUNTER — Other Ambulatory Visit: Payer: Self-pay | Admitting: Obstetrics and Gynecology

## 2023-07-10 DIAGNOSIS — Z01419 Encounter for gynecological examination (general) (routine) without abnormal findings: Secondary | ICD-10-CM

## 2023-07-10 DIAGNOSIS — M810 Age-related osteoporosis without current pathological fracture: Secondary | ICD-10-CM

## 2023-07-10 DIAGNOSIS — I10 Essential (primary) hypertension: Secondary | ICD-10-CM

## 2023-07-10 DIAGNOSIS — Z780 Asymptomatic menopausal state: Secondary | ICD-10-CM

## 2023-07-16 ENCOUNTER — Ambulatory Visit
Admission: RE | Admit: 2023-07-16 | Discharge: 2023-07-16 | Disposition: A | Discharge status: Home / Self Care | Payer: BC Managed Care – PPO | Source: Ambulatory Visit | Attending: Obstetrics & Gynecology | Admitting: Obstetrics & Gynecology

## 2023-07-16 DIAGNOSIS — M810 Age-related osteoporosis without current pathological fracture: Secondary | ICD-10-CM

## 2023-07-17 LAB — HM DEXA SCAN: HM Dexa Scan: -2.5

## 2023-07-18 ENCOUNTER — Encounter: Payer: Self-pay | Admitting: Internal Medicine

## 2023-08-05 ENCOUNTER — Other Ambulatory Visit: Payer: Self-pay | Admitting: Physical Medicine & Rehabilitation

## 2023-08-05 DIAGNOSIS — G510 Bell's palsy: Secondary | ICD-10-CM

## 2023-08-05 DIAGNOSIS — H179 Unspecified corneal scar and opacity: Secondary | ICD-10-CM

## 2023-09-03 ENCOUNTER — Other Ambulatory Visit: Payer: Self-pay | Admitting: Physical Medicine & Rehabilitation

## 2023-09-03 DIAGNOSIS — G510 Bell's palsy: Secondary | ICD-10-CM

## 2023-09-03 DIAGNOSIS — D333 Benign neoplasm of cranial nerves: Secondary | ICD-10-CM

## 2023-09-09 ENCOUNTER — Ambulatory Visit: Payer: BC Managed Care – PPO | Admitting: Physical Medicine & Rehabilitation

## 2023-09-10 ENCOUNTER — Encounter: Payer: 59 | Attending: Physical Medicine & Rehabilitation | Admitting: Physical Medicine & Rehabilitation

## 2023-09-10 ENCOUNTER — Encounter: Payer: Self-pay | Admitting: Physical Medicine & Rehabilitation

## 2023-09-10 VITALS — BP 139/83 | HR 78 | Ht 66.0 in | Wt 148.0 lb

## 2023-09-10 DIAGNOSIS — G894 Chronic pain syndrome: Secondary | ICD-10-CM

## 2023-09-10 MED ORDER — GABAPENTIN 100 MG PO CAPS: 100.0000 mg | ORAL_CAPSULE | Freq: Three times a day (TID) | ORAL | 5 refills | Status: DC

## 2023-09-10 NOTE — Progress Notes (Signed)
 Subjective:    Patient ID: Cristina Conner, female    DOB: 1961/07/01, 63 y.o.   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 Since I saw her she went on a trip to the [place] and has a trip to [place] planned.   From a pain standpoint she's doing well. She remains on tramadol  50mg  twice daily.  She tried skipping an am dose one day and her pain was significant by mid afternoon. Pain is typically burning/pins and needles in quallity. Occasionally she will have stabbing pain.  She is moving her bowels and bladder. Sleeps well.     Pain Inventory Average Pain 2 Pain Right Now 0 My pain is constant, sharp, and stabbing  In the last 24 hours, has pain interfered with the following? General activity 0 Relation with others 0 Enjoyment of life 0 What TIME of day is your pain at its worst? evening Sleep (in general) Good  Pain is worse with: unsure Pain improves with: rest and medication Relief from Meds: 10  Family History  Problem Relation Age of Onset   Hypertension Mother    Heart disease Mother    Diabetes Mother    Kidney disease Mother    Hypertension Father    Colon cancer Neg Hx    Colon polyps Neg Hx    Esophageal cancer Neg Hx    Stomach cancer Neg Hx    Rectal cancer Neg Hx    Social History   Socioeconomic History   Marital status: Widowed    Spouse name: Not on file   Number of children: Not on file   Years of education: Not on file   Highest education level: Not on file  Occupational History   Not on file  Tobacco Use   Smoking status: Never   Smokeless tobacco: Never  Vaping Use   Vaping status: Never Used  Substance and Sexual Activity   Alcohol  use: Not Currently   Drug use: No   Sexual activity: Not Currently    Partners: Male    Birth control/protection: Post-menopausal    Comment: 1st intercourse- 19, partners- 4  Other Topics Concern   Not on file  Social History  Narrative   Widowed in summer 2020   Former smoker no alcohol  or drug use   Social Drivers of Corporate Investment Banker Strain: Not on file  Food Insecurity: Not on file  Transportation Needs: Not on file  Physical Activity: Not on file  Stress: Not on file  Social Connections: Not on file   Past Surgical History:  Procedure Laterality Date   BRAIN SURGERY N/A    [employer] 07/22/2020   CESAREAN SECTION     x 2   COLONOSCOPY     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   at [facility]   CRANIOTOMY Left 01/27/2020   Procedure: Left Craniotomy for Tumor Resection;  Surgeon: Cheryle Debby LABOR, MD;  Location: [facility] OR;  Service: Neurosurgery;  Laterality: Left;   ENDOMETRIAL ABLATION     ESOPHAGOGASTRODUODENOSCOPY  10/2019   LAPAROSCOPY N/A 01/19/2020   Procedure: LAPAROSCOPY DIAGNOSTIC LAPAROTOMY WITH SMALL BOWEL RESECTION;  Surgeon: Teresa Lonni HERO, MD;  Location: [facility] ORS;  Service: General;  Laterality: N/A;   PELVIC LAPAROSCOPY  1999   left salpingectomy, excision of Right, paratubal cyst   PELVIC LAPAROSCOPY     laparoscopy with lysis of pelvic adhesions   SHOULDER SURGERY  11/2010   FROZEN SHOULDER   TOTAL HIP ARTHROPLASTY Left 08/17/2019   Procedure: LEFT TOTAL HIP ARTHROPLASTY ANTERIOR APPROACH;  Surgeon: Sheril Coy, MD;  Location: [facility];  Service: Orthopedics;  Laterality: Left;   TUBAL LIGATION     UPPER GASTROINTESTINAL ENDOSCOPY     VENTRICULOSTOMY Left 01/27/2020   Procedure: VENTRICULOSTOMY;  Surgeon: Cheryle Debby LABOR, MD;  Location: [facility] OR;  Service: Neurosurgery;  Laterality: Left;   Past Surgical History:  Procedure Laterality Date   BRAIN SURGERY N/A    [employer] 07/22/2020   CESAREAN SECTION     x 2   COLONOSCOPY     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   at [facility]   CRANIOTOMY Left 01/27/2020   Procedure: Left Craniotomy for Tumor Resection;  Surgeon: Cheryle Debby LABOR, MD;  Location: [facility] OR;  Service: Neurosurgery;  Laterality: Left;    ENDOMETRIAL ABLATION     ESOPHAGOGASTRODUODENOSCOPY  10/2019   LAPAROSCOPY N/A 01/19/2020   Procedure: LAPAROSCOPY DIAGNOSTIC LAPAROTOMY WITH SMALL BOWEL RESECTION;  Surgeon: Teresa Lonni HERO, MD;  Location: [facility];  Service: General;  Laterality: N/A;   PELVIC LAPAROSCOPY  1999   left salpingectomy, excision of Right, paratubal cyst   PELVIC LAPAROSCOPY     laparoscopy with lysis of pelvic adhesions   SHOULDER SURGERY  11/2010   FROZEN SHOULDER   TOTAL HIP ARTHROPLASTY Left 08/17/2019   Procedure: LEFT TOTAL HIP ARTHROPLASTY ANTERIOR APPROACH;  Surgeon: Sheril Coy, MD;  Location: [facility];  Service: Orthopedics;  Laterality: Left;   TUBAL LIGATION     UPPER GASTROINTESTINAL ENDOSCOPY     VENTRICULOSTOMY Left 01/27/2020   Procedure: VENTRICULOSTOMY;  Surgeon: Cheryle Debby LABOR, MD;  Location: [facility] OR;  Service: Neurosurgery;  Laterality: Left;   Past Medical History:  Diagnosis Date   Anxiety    Brain tumor (HCC)    vestibular neuroma   Diverticulosis    Hypertension    Osteopenia    Osteoporosis    Post-operative nausea and vomiting    vomiting after general anesthesia per pt.   Pre-diabetes    BP (!) 169/91   Pulse 78   Ht 5' 6 (1.676 m)   Wt 148 lb (67.1 kg)   LMP 05/01/2009 Comment: not sexually active  SpO2 100%   BMI 23.89 kg/m   Opioid Risk Score:   Fall Risk Score:  `1  Depression screen [facility] 2/9     01/09/2023   10:34 AM 09/04/2022   10:41 AM 07/30/2022    3:40 PM 03/26/2022    9:38 AM 02/27/2022   11:07 AM 08/29/2021    9:49 AM 07/20/2021   10:37 AM  Depression screen [facility] 2/9  Decreased Interest 0 0 0 0 0 0 0  Down, Depressed, Hopeless 0 0 0 0 0 0 0  PHQ - 2 Score 0 0 0 0 0 0 0  Altered sleeping 0  0 1   0  Tired, decreased energy 0  0 0   0  Change in appetite 0  0 0   0  Feeling bad or failure about yourself  0  0 0   0  Trouble concentrating 0  0 0   0  Moving slowly or fidgety/restless 0  0 0   0  Suicidal thoughts 0  0 0   0  PHQ-9 Score  0  0 1   0  Difficult doing work/chores Not difficult at all  Not difficult at all  Review of Systems     Objective:   Physical Exam General: No acute distress HEENT: NCAT, EOMI, oral membranes moist. Eye without irritation Cards: reg rate  Chest: normal effort Abdomen: Soft, NT, ND Skin: dry, intact Extremities: no edema Psych: pleasant and appropriate  Neuro: closes eye lid to NEARLY 95%  . Left facial droop, tongue deviation stable. Numbness along left hemi-face still present. Balance functional/fair.  Strength is grossly 5 out of 5 in all 4 limbs.. left sided hearing loss present.  Musculoskeletal: No focal deficits.  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 Is driving now! 2.  Pain: generally much improved.             - tramadol  for severe pain. No new  RF needed today #60             -continue flexeril  5mg  prn for spasms-no new RF needed             -will begin a trial of gabapentin  for neuropathic pain with goal of potentially coming off tramadol .100mg  tid to start. 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             -continue per ophtho. Local eye care            -contacts per ophthamollogy helping! 4. ABLA:   iron /B complex with mulit-vitamin  5. HTN:             -metoprolol . Primary following 6. Left hip pain s/p THR 12/20:              -stable 7. Vestibular dysfunction: improved. Occasional symptoms. Coping well.    20 minutes of face to face patient care time were spent during this visit. All questions were encouraged and answered.  Follow up with me in 3 mos .

## 2023-09-10 NOTE — Patient Instructions (Signed)
 ALWAYS FEEL FREE TO CALL OUR OFFICE WITH ANY PROBLEMS OR QUESTIONS [phone])  **PLEASE NOTE** ALL MEDICATION REFILL REQUESTS (INCLUDING CONTROLLED SUBSTANCES) NEED TO BE MADE AT LEAST 7 DAYS PRIOR TO REFILL BEING DUE. ANY REFILL REQUESTS INSIDE THAT TIME FRAME MAY RESULT IN DELAYS IN RECEIVING YOUR PRESCRIPTION.     !!!!!HAPPY NEW YEAR!!!!!   GABAPENTIN  100 MG THREE X DAILY   IN TWO WEEKS, IF NO SIDE EFFECTS AND NO CHANGE IN PAIN, THEN INCREASE TO 200 MG THEE X DAILY.  CALL ME IF YOU NEED REFILLS INSIDE OF 30 DAYS.

## 2023-10-01 ENCOUNTER — Encounter: Payer: Self-pay | Admitting: Physical Medicine & Rehabilitation

## 2023-10-01 DIAGNOSIS — G894 Chronic pain syndrome: Secondary | ICD-10-CM

## 2023-10-01 MED ORDER — GABAPENTIN 100 MG PO CAPS: 200.0000 mg | ORAL_CAPSULE | Freq: Three times a day (TID) | ORAL | 5 refills | Status: DC

## 2023-10-01 NOTE — Telephone Encounter (Signed)
RX changed to 200mg  tid and sent to pharmacy

## 2023-11-03 ENCOUNTER — Encounter: Payer: Self-pay | Admitting: Obstetrics and Gynecology

## 2023-11-19 ENCOUNTER — Encounter: Payer: Self-pay | Admitting: Physical Medicine & Rehabilitation

## 2023-11-19 ENCOUNTER — Encounter: Payer: Self-pay | Admitting: Internal Medicine

## 2023-11-21 ENCOUNTER — Ambulatory Visit: Admitting: Nurse Practitioner

## 2023-11-21 ENCOUNTER — Ambulatory Visit: Admitting: Internal Medicine

## 2023-11-21 VITALS — BP 148/86 | HR 95 | Temp 98.2°F | Ht 66.0 in | Wt 160.5 lb

## 2023-11-21 DIAGNOSIS — D229 Melanocytic nevi, unspecified: Secondary | ICD-10-CM | POA: Diagnosis not present

## 2023-11-21 DIAGNOSIS — L299 Pruritus, unspecified: Secondary | ICD-10-CM

## 2023-11-21 MED ORDER — BETAMETHASONE VALERATE 0.1 % EX CREA: TOPICAL_CREAM | Freq: Two times a day (BID) | CUTANEOUS | 0 refills | Status: DC

## 2023-11-21 NOTE — Assessment & Plan Note (Signed)
 Lesions of concern to the patient on chest appear consistent with seborrheic keratoses.  She also has a lesion under the skin that appears to be consistent with either sebaceous cyst or lipoma on her chest.  Lesion on her leg possibly consistent with wart.  I did offer reassurance to patient but will also refer to dermatology per her request.

## 2023-11-21 NOTE — Progress Notes (Signed)
   Established Patient Office Visit  Subjective   Patient ID: Cristina Conner, female    DOB: 08/16/1961  Age: 63 y.o. MRN: [id number]  Chief Complaint  Patient presents with   Nevus   Patient arrives today for the above She reports that she has had moles and other skin lesions located to her chest and left leg that have been present for months to years, but seem to be changing over time.  She would like to be established with dermatologist.  She has no personal history of skin cancer but does have family history of skin cancer in her mother.  She is not sure what type of cancer her mom had but does report that mother had to undergo surgery for treatment.  She also mentions an itchy scalp, she has 1 specific spot at the top of her scalp that is very bothersome.  She denies dandruff or dry skin elsewhere.     ROS: see HPI    Objective:     BP (!) 148/86   Pulse 95   Temp 98.2 F (36.8 C) (Temporal)   Ht 5\' 6"  (1.676 m)   Wt 160 lb 8 oz (72.8 kg)   LMP 05/01/2009 Comment: not sexually active  SpO2 98%   BMI 25.91 kg/m  BP Readings from Last 3 Encounters:  11/21/23 (!) 148/86  09/10/23 139/83  07/09/23 (!) 158/100   Wt Readings from Last 3 Encounters:  11/21/23 160 lb 8 oz (72.8 kg)  09/10/23 148 lb (67.1 kg)  07/09/23 155 lb (70.3 kg)      Physical Exam Vitals reviewed.  Constitutional:      Appearance: Normal appearance.  HENT:     Head: Normocephalic.  Pulmonary:     Effort: Pulmonary effort is normal.  Skin:    General: Skin is warm and dry.       Neurological:     Mental Status: She is alert.  Psychiatric:        Mood and Affect: Mood normal.        Behavior: Behavior normal.        Thought Content: Thought content normal.        Judgment: Judgment normal.      No results found for any visits on 11/21/23.    The 10-year ASCVD risk score (Arnett DK, et al., 2019) is: 9.3%    Assessment & Plan:   Problem List Items Addressed This Visit        Musculoskeletal and Integument   Numerous moles - Primary   Lesions of concern to the patient on chest appear consistent with seborrheic keratoses.  She also has a lesion under the skin that appears to be consistent with either sebaceous cyst or lipoma on her chest.  Lesion on her leg possibly consistent with wart.  I did offer reassurance to patient but will also refer to dermatology per her request.      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 Treat with low potency topical steroid twice a day for up to 2 weeks.  Also discussed use of oral antihistamine, cold compress. Referral to dermatology ordered today.      Relevant Medications   betamethasone valerate (VALISONE) 0.1 % cream    Return if symptoms worsen or fail to improve.    Elenore Paddy, NP

## 2023-11-21 NOTE — Assessment & Plan Note (Signed)
 Chronic (ongoing for at least 6 months) Etiology unclear, no evidence of support dermatitis identified on scalp. Treat with low potency topical steroid twice a day for up to 2 weeks.  Also discussed use of oral antihistamine, cold compress. Referral to dermatology ordered today.

## 2023-12-10 ENCOUNTER — Encounter: Payer: Self-pay | Admitting: Physical Medicine & Rehabilitation

## 2023-12-10 ENCOUNTER — Encounter: Payer: 59 | Attending: Physical Medicine & Rehabilitation | Admitting: Physical Medicine & Rehabilitation

## 2023-12-10 VITALS — BP 167/97 | HR 80 | Ht 66.0 in | Wt 161.0 lb

## 2023-12-10 DIAGNOSIS — D333 Benign neoplasm of cranial nerves: Secondary | ICD-10-CM | POA: Diagnosis present

## 2023-12-10 DIAGNOSIS — M792 Neuralgia and neuritis, unspecified: Secondary | ICD-10-CM | POA: Insufficient documentation

## 2023-12-10 DIAGNOSIS — G510 Bell's palsy: Secondary | ICD-10-CM

## 2023-12-10 DIAGNOSIS — G894 Chronic pain syndrome: Secondary | ICD-10-CM | POA: Diagnosis not present

## 2023-12-10 MED ORDER — GABAPENTIN 300 MG PO CAPS: 300.0000 mg | ORAL_CAPSULE | Freq: Three times a day (TID) | ORAL | 4 refills | Status: DC

## 2023-12-10 MED ORDER — CYCLOBENZAPRINE HCL 5 MG PO TABS: ORAL_TABLET | ORAL | 3 refills | Status: DC

## 2023-12-10 NOTE — Patient Instructions (Signed)
 ALWAYS FEEL FREE TO CALL OUR OFFICE WITH ANY PROBLEMS OR QUESTIONS [phone])  **PLEASE NOTE** ALL MEDICATION REFILL REQUESTS (INCLUDING CONTROLLED SUBSTANCES) NEED TO BE MADE AT LEAST 7 DAYS PRIOR TO REFILL BEING DUE. ANY REFILL REQUESTS INSIDE THAT TIME FRAME MAY RESULT IN DELAYS IN RECEIVING YOUR PRESCRIPTION.

## 2023-12-10 NOTE — Progress Notes (Signed)
 Subjective:    Patient ID: Cristina Conner, female    DOB: 1960-10-24, 63 y.o.   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  I last saw her in January. She's had some itching on her scalp which contains a steroid. She asked why she has sensation there.   We started her on gabapentin at last visit with titration to 200 mg 3 times daily.  Her pain is much improved and she no longer is needing to use tramadol at this point.  She does have occasional pain in the evening.  She seems to be tolerating the medication well.  From a balance standpoint she is doing well.  She reports no changes from the standpoint of the management of her left eye.  [name] remains minimally irritated and lid closure is improved.  She is still has facial sensory loss on the left.   Pain Inventory Average Pain 2 Pain Right Now 0 My pain is dull and aching  In the last 24 hours, has pain interfered with the following? General activity 0 Relation with others 0 Enjoyment of life 0 What TIME of day is your pain at its worst? evening Sleep (in general) Fair  Pain is worse with:  fatigue Pain improves with: medication Relief from Meds: 10  Family History  Problem Relation Age of Onset   Hypertension Mother    Heart disease Mother    Diabetes Mother    Kidney disease Mother    Hypertension Father    Colon cancer Neg Hx    Colon polyps Neg Hx    Esophageal cancer Neg Hx    Stomach cancer Neg Hx    Rectal cancer Neg Hx    Social History   Socioeconomic History   Marital status: Widowed    Spouse name: Not on file   Number of children: Not on file   Years of education: Not on file   Highest education level: Not on file  Occupational History   Not on file  Tobacco Use   Smoking status: Never   Smokeless tobacco: Never  Vaping Use   Vaping status: Never Used  Substance and Sexual  Activity   Alcohol use: Not Currently   Drug use: No   Sexual activity: Not Currently    Partners: Male    Birth control/protection: Post-menopausal    Comment: 1st intercourse- 52, partners- 4  Other Topics Concern   Not on file  Social History Narrative   Widowed in summer 2020   Former smoker no alcohol or drug use   Social Drivers of Corporate investment banker Strain: Not on file  Food Insecurity: Not on file  Transportation Needs: Not on file  Physical Activity: Not on file  Stress: Not on file  Social Connections: Not on file   Past Surgical History:  Procedure Laterality Date   BRAIN SURGERY N/A    [employer]rees[employer] 07/22/2020   CESAREAN SECTION     x 2   COLONOSCOPY     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   at [facility]   CRANIOTOMY Left 01/27/2020   Procedure: Left Craniotomy for Tumor Resection;  Surgeon: Jadene Pierini, MD;  Location: [facility] OR;  Service: Neurosurgery;  Laterality: Left;   ENDOMETRIAL ABLATION     ESOPHAGOGASTRODUODENOSCOPY  10/2019   LAPAROSCOPY N/A 01/19/2020   Procedure: LAPAROSCOPY DIAGNOSTIC LAPAROTOMY WITH SMALL BOWEL RESECTION;  Surgeon: Andria Meuse, MD;  Location: [facility] ORS;  Service: General;  Laterality: N/A;   PELVIC LAPAROSCOPY  1999   left salpingectomy, excision of Right, paratubal cyst   PELVIC LAPAROSCOPY     laparoscopy with lysis of pelvic adhesions   SHOULDER SURGERY  11/2010   "FROZEN SHOULDER"   TOTAL HIP ARTHROPLASTY Left 08/17/2019   Procedure: LEFT TOTAL HIP ARTHROPLASTY ANTERIOR APPROACH;  Surgeon: Marcene Corning, MD;  Location: [facility];  Service: Orthopedics;  Laterality: Left;   TUBAL LIGATION     UPPER GASTROINTESTINAL ENDOSCOPY     VENTRICULOSTOMY Left 01/27/2020   Procedure: VENTRICULOSTOMY;  Surgeon: Jadene Pierini, MD;  Location: [facility] OR;  Service: Neurosurgery;  Laterality: Left;   Past Surgical History:  Procedure Laterality Date   BRAIN SURGERY N/A    [employer]rees[employer] 07/22/2020   CESAREAN SECTION     x 2    COLONOSCOPY     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   at [facility]   CRANIOTOMY Left 01/27/2020   Procedure: Left Craniotomy for Tumor Resection;  Surgeon: Jadene Pierini, MD;  Location: [facility] OR;  Service: Neurosurgery;  Laterality: Left;   ENDOMETRIAL ABLATION     ESOPHAGOGASTRODUODENOSCOPY  10/2019   LAPAROSCOPY N/A 01/19/2020   Procedure: LAPAROSCOPY DIAGNOSTIC LAPAROTOMY WITH SMALL BOWEL RESECTION;  Surgeon: Andria Meuse, MD;  Location: [facility];  Service: General;  Laterality: N/A;   PELVIC LAPAROSCOPY  1999   left salpingectomy, excision of Right, paratubal cyst   PELVIC LAPAROSCOPY     laparoscopy with lysis of pelvic adhesions   SHOULDER SURGERY  11/2010   "FROZEN SHOULDER"   TOTAL HIP ARTHROPLASTY Left 08/17/2019   Procedure: LEFT TOTAL HIP ARTHROPLASTY ANTERIOR APPROACH;  Surgeon: Marcene Corning, MD;  Location: [facility];  Service: Orthopedics;  Laterality: Left;   TUBAL LIGATION     UPPER GASTROINTESTINAL ENDOSCOPY     VENTRICULOSTOMY Left 01/27/2020   Procedure: VENTRICULOSTOMY;  Surgeon: Jadene Pierini, MD;  Location: [facility] OR;  Service: Neurosurgery;  Laterality: Left;   Past Medical History:  Diagnosis Date   Anxiety    Brain tumor (HCC)    vestibular neuroma   Diverticulosis    Hypertension    Osteopenia    Osteoporosis    Post-operative nausea and vomiting    vomiting after general anesthesia per pt.   Pre-diabetes    BP (!) 167/97   Pulse 80   Ht 5\' 6"  (1.676 m)   Wt 161 lb (73 kg)   LMP 05/01/2009 Comment: not sexually active  SpO2 98%   BMI 25.99 kg/m   Opioid Risk Score:   Fall Risk Score:  `1  Depression screen [facility] 2/9     01/09/2023   10:34 AM 09/04/2022   10:41 AM 07/30/2022    3:40 PM 03/26/2022    9:38 AM 02/27/2022   11:07 AM 08/29/2021    9:49 AM 07/20/2021   10:37 AM  Depression screen PHQ 2/9  Decreased Interest 0 0 0 0 0 0 0  Down, Depressed, Hopeless 0 0 0 0 0 0 0  PHQ - 2 Score 0 0 0 0 0 0 0  Altered sleeping 0  0 1   0   Tired, decreased energy 0  0 0   0  Change in appetite 0  0 0   0  Feeling bad or failure about yourself  0  0 0   0  Trouble concentrating 0  0 0   0  Moving slowly or fidgety/restless 0  0 0   0  Suicidal thoughts 0  0 0   0  PHQ-9 Score 0  0 1   0  Difficult doing work/chores Not difficult at all  Not difficult at all        Review of Systems  Musculoskeletal:        Left side of head  All other systems reviewed and are negative.     Objective:   Physical Exam General: No acute distress HEENT: NCAT, EOMI, oral membranes moist Cards: reg rate  Chest: normal effort Abdomen: Soft, NT, ND Skin: dry, intact Extremities: no edema Psych: pleasant and appropriate   Neuro: closes eye lid almost shut. Less pronounced  . Left facial droop, tongue deviation stable. Numbness along left hemi-face remains. Balance is stable/good  Strength is grossly 5 out of 5 in all 4 limbs.. left sided hearing loss present.  Musculoskeletal: No focal deficits.  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   -she walks daily. She is very active 2.  Pain: generally much improved.             - tramadol for severe pain only.  Will keep this on her list for now just in case she has any breakthrough pain.             -continue flexeril 5mg  prn for spasms-occasionally in the evening-- RF today             -titrate gabapentin to 300mg  tid to help further with pain and reduce pill count 3. Left VII nerve injury: lid weight, closure improved--no new plan            -continue per ophtho. Local eye care            -contacts per ophthamollogy helping! 4. ABLA:   iron/B complex with mulit-vitamin  5. HTN:             -metoprolol. Primary following 6. Left hip pain s/p THR 12/20:               -stable 7. Vestibular dysfunction: much improved   20  minutes of face to face patient care time were spent during this visit. All questions were encouraged and answered.  Follow up with me in 6 mos .

## 2023-12-31 ENCOUNTER — Other Ambulatory Visit: Payer: Self-pay | Admitting: Physical Medicine & Rehabilitation

## 2024-01-06 ENCOUNTER — Encounter: Payer: Self-pay | Admitting: Internal Medicine

## 2024-01-06 ENCOUNTER — Ambulatory Visit (INDEPENDENT_AMBULATORY_CARE_PROVIDER_SITE_OTHER): Payer: BC Managed Care – PPO | Admitting: Internal Medicine

## 2024-01-06 VITALS — BP 134/86 | HR 88 | Temp 97.8°F | Ht 66.0 in | Wt 160.0 lb

## 2024-01-06 DIAGNOSIS — I10 Essential (primary) hypertension: Secondary | ICD-10-CM | POA: Diagnosis not present

## 2024-01-06 DIAGNOSIS — Z0000 Encounter for general adult medical examination without abnormal findings: Secondary | ICD-10-CM | POA: Diagnosis not present

## 2024-01-06 DIAGNOSIS — E785 Hyperlipidemia, unspecified: Secondary | ICD-10-CM

## 2024-01-06 DIAGNOSIS — M792 Neuralgia and neuritis, unspecified: Secondary | ICD-10-CM

## 2024-01-06 DIAGNOSIS — R7303 Prediabetes: Secondary | ICD-10-CM | POA: Diagnosis not present

## 2024-01-06 DIAGNOSIS — D333 Benign neoplasm of cranial nerves: Secondary | ICD-10-CM

## 2024-01-06 LAB — CBC
HCT: 34.3 % — ABNORMAL LOW (ref 36.0–46.0)
Hemoglobin: 10.6 g/dL — ABNORMAL LOW (ref 12.0–15.0)
MCHC: 30.9 g/dL (ref 30.0–36.0)
MCV: 74.4 fl — ABNORMAL LOW (ref 78.0–100.0)
Platelets: 327 10*3/uL (ref 150.0–400.0)
RBC: 4.61 Mil/uL (ref 3.87–5.11)
RDW: 16.1 % — ABNORMAL HIGH (ref 11.5–15.5)
WBC: 5.7 10*3/uL (ref 4.0–10.5)

## 2024-01-06 LAB — LIPID PANEL
Cholesterol: 189 mg/dL (ref 0–200)
HDL: 56.7 mg/dL (ref >39.00)
LDL Cholesterol: 110 mg/dL — ABNORMAL HIGH (ref 0–99)
NonHDL: 132.4
Total CHOL/HDL Ratio: 3
Triglycerides: 113 mg/dL (ref 0.0–149.0)
VLDL: 22.6 mg/dL (ref 0.0–40.0)

## 2024-01-06 LAB — COMPREHENSIVE METABOLIC PANEL WITH GFR
ALT: 20 U/L (ref 0–35)
AST: 19 U/L (ref 0–37)
Albumin: 4.4 g/dL (ref 3.5–5.2)
Alkaline Phosphatase: 66 U/L (ref 39–117)
BUN: 12 mg/dL (ref 6–23)
CO2: 28 meq/L (ref 19–32)
Calcium: 10.1 mg/dL (ref 8.4–10.5)
Chloride: 104 meq/L (ref 96–112)
Creatinine, Ser: 0.84 mg/dL (ref 0.40–1.20)
GFR: 74.91 mL/min (ref >60.00)
Glucose, Bld: 99 mg/dL (ref 70–99)
Potassium: 4.2 meq/L (ref 3.5–5.1)
Sodium: 139 meq/L (ref 135–145)
Total Bilirubin: 0.4 mg/dL (ref 0.2–1.2)
Total Protein: 7.5 g/dL (ref 6.0–8.3)

## 2024-01-06 LAB — HEMOGLOBIN A1C: Hgb A1c MFr Bld: 6.2 % (ref 4.6–6.5)

## 2024-01-06 NOTE — Assessment & Plan Note (Signed)
 Suspect itching on scalp may be neurogenic s/p surgery. She is encouraged to see derm to rule out scalp condition. Advised to trim nails back to help avoid damage to scalp since she does not have sensation.

## 2024-01-06 NOTE — Assessment & Plan Note (Signed)
Checking HgA1c. Adjust as needed. 

## 2024-01-06 NOTE — Assessment & Plan Note (Signed)
BP at goal on metoprolol 50 mg daily. Checking CMP and adjust as needed.

## 2024-01-06 NOTE — Assessment & Plan Note (Signed)
 S/P surgery and with residual deficits including sensation, balance, hearing loss, facial asymetry.

## 2024-01-06 NOTE — Assessment & Plan Note (Signed)
 Flu shot yearly. Shingrix complete. Tetanus up to date. Colonoscopy up to date. Mammogram up to date, pap smear getting soon and dexa up to date. Counseled about sun safety and mole surveillance. Counseled about the dangers of distracted driving. Given 10 year screening recommendations.

## 2024-01-06 NOTE — Progress Notes (Signed)
   Subjective:   Patient ID: Cristina Conner, female    DOB: 1961-04-14, 63 y.o.   MRN: [id number]  HPI The patient is here for physical.  PMH, [facility], social history reviewed and updated  Review of Systems  Constitutional: Negative.   HENT: Negative.    Eyes: Negative.   Respiratory:  Negative for cough, chest tightness and shortness of breath.   Cardiovascular:  Negative for chest pain, palpitations and leg swelling.  Gastrointestinal:  Negative for abdominal distention, abdominal pain, constipation, diarrhea, nausea and vomiting.  Musculoskeletal: Negative.   Skin: Negative.        Scalp itching  Neurological: Negative.   Psychiatric/Behavioral: Negative.      Objective:  Physical Exam Constitutional:      Appearance: She is well-developed.  HENT:     Head: Normocephalic and atraumatic.  Cardiovascular:     Rate and Rhythm: Normal rate and regular rhythm.  Pulmonary:     Effort: Pulmonary effort is normal. No respiratory distress.     Breath sounds: Normal breath sounds. No wheezing or rales.  Abdominal:     General: Bowel sounds are normal. There is no distension.     Palpations: Abdomen is soft.     Tenderness: There is no abdominal tenderness. There is no rebound.  Musculoskeletal:     Cervical back: Normal range of motion.  Skin:    General: Skin is warm and dry.     Comments:  Sore on scalp  Neurological:     Mental Status: She is alert and oriented to person, place, and time.     Coordination: Coordination normal.     Vitals:   01/06/24 0948 01/06/24 1022  BP: (!) 144/92 134/86  Pulse: 88   Temp: 97.8 F (36.6 C)   TempSrc: Oral   SpO2: 98%   Weight: 160 lb (72.6 kg)   Height: 5\' 6"  (1.676 m)     Assessment & Plan:

## 2024-01-06 NOTE — Assessment & Plan Note (Signed)
 Checking lipid panel and adjust as needed.

## 2024-01-08 ENCOUNTER — Ambulatory Visit (INDEPENDENT_AMBULATORY_CARE_PROVIDER_SITE_OTHER): Payer: 59 | Admitting: Obstetrics and Gynecology

## 2024-01-08 ENCOUNTER — Other Ambulatory Visit (HOSPITAL_COMMUNITY)
Admission: RE | Admit: 2024-01-08 | Discharge: 2024-01-08 | Disposition: A | Discharge status: Home / Self Care | Source: Ambulatory Visit | Attending: Obstetrics and Gynecology | Admitting: Obstetrics and Gynecology

## 2024-01-08 ENCOUNTER — Encounter: Payer: Self-pay | Admitting: Obstetrics and Gynecology

## 2024-01-08 VITALS — BP 120/84 | HR 76 | Resp 16 | Ht 64.75 in | Wt 160.0 lb

## 2024-01-08 DIAGNOSIS — Z1331 Encounter for screening for depression: Secondary | ICD-10-CM

## 2024-01-08 DIAGNOSIS — E2839 Other primary ovarian failure: Secondary | ICD-10-CM | POA: Diagnosis not present

## 2024-01-08 DIAGNOSIS — Z01419 Encounter for gynecological examination (general) (routine) without abnormal findings: Secondary | ICD-10-CM | POA: Diagnosis present

## 2024-01-08 NOTE — Progress Notes (Signed)
 63 y.o. y.o. female here for annual exam. Patient's last menstrual period was 05/01/2009.   G2P2L2 Widowed x 03/2019.   RP:  Established patient presenting for annual gyn exam    HPI: Postmenopausal, well on no HRT.  No PMB.  No pelvic pain.  New relationship.  Had dryness/discomfort with IC.  Suggest Astroglide or coconut oil.  May benefit from practicing by herself to strengthen the vaginal skin.  Declines vaginal Estradiol  treatment.  Pap 10/2020 Neg.  No h/o abnormal Pap.  Repeat Pap at 3 years. Urine/BMs wnl.  Breasts wnl. Mammo Neg 10/2022. BMI 24.11.  Healthy nutrition.  Health labs with Fam MD. Colono normal 10/2012/Sigmoidoscopy 01/2020.  Will schedule Colono this year.  BD 12/2020 Osteoporosis T-Score -2.7 at the [facility].  Repeat BD 01/2023 -2.0 with no treatment?error. TO get one year follow-up and repeat.  at the [facility]. Brain tumor surgery/Cx Facial nerve damage, had Corneal transplant left eye.  cHTN, increased recently.  Blood pressure 120/84, pulse 76, resp. rate 16, height 5' 4.75" (1.645 m), weight 160 lb (72.6 kg), last menstrual period 05/01/2009.  Body mass index is 26.83 kg/m.     01/08/2024   10:23 AM 01/09/2023   10:34 AM 09/04/2022   10:41 AM  Depression screen PHQ 2/9  Decreased Interest 0 0 0  Down, Depressed, Hopeless 0 0 0  PHQ - 2 Score 0 0 0  Altered sleeping  0   Tired, decreased energy  0   Change in appetite  0   Feeling bad or failure about yourself   0   Trouble concentrating  0   Moving slowly or fidgety/restless  0   Suicidal thoughts  0   PHQ-9 Score  0   Difficult doing work/chores  Not difficult at all     Blood pressure 120/84, pulse 76, resp. rate 16, height 5' 4.75" (1.645 m), weight 160 lb (72.6 kg), last menstrual period 05/01/2009.  No results found for: "DIAGPAP", "HPVHIGH", "ADEQPAP"  GYN HISTORY: No results found for: "DIAGPAP", "HPVHIGH", "ADEQPAP"  OB History  Gravida Para Term Preterm AB Living  2 2 1 1  2   SAB IAB  Ectopic Multiple Live Births      2    # Outcome Date GA Lbr Len/2nd Weight Sex Type Anes PTL Lv  2 Preterm           1 Term             Past Medical History:  Diagnosis Date   Anxiety    Brain tumor (HCC)    vestibular neuroma   Diverticulosis    Hypertension    Osteopenia    Osteoporosis    Post-operative nausea and vomiting    vomiting after general anesthesia per pt.   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   at [facility]   CRANIOTOMY Left 01/27/2020   Procedure: Left Craniotomy for Tumor Resection;  Surgeon: Cannon Champion, MD;  Location: [facility] OR;  Service: Neurosurgery;  Laterality: Left;   ENDOMETRIAL ABLATION     ESOPHAGOGASTRODUODENOSCOPY  10/2019   LAPAROSCOPY N/A 01/19/2020   Procedure: LAPAROSCOPY DIAGNOSTIC LAPAROTOMY WITH SMALL BOWEL RESECTION;  Surgeon: Melvenia Stabs, MD;  Location: [facility];  Service: General;  Laterality: N/A;  PELVIC LAPAROSCOPY  1999   left salpingectomy, excision of Right, paratubal cyst   PELVIC LAPAROSCOPY     laparoscopy with lysis of pelvic adhesions   SHOULDER SURGERY  11/2010   "FROZEN SHOULDER"   TOTAL HIP ARTHROPLASTY Left 08/17/2019   Procedure: LEFT TOTAL HIP ARTHROPLASTY ANTERIOR APPROACH;  Surgeon: Dayne Even, MD;  Location: [facility];  Service: Orthopedics;  Laterality: Left;   TUBAL LIGATION     UPPER GASTROINTESTINAL ENDOSCOPY     VENTRICULOSTOMY Left 01/27/2020   Procedure: VENTRICULOSTOMY;  Surgeon: Cannon Champion, MD;  Location: [facility] OR;  Service: Neurosurgery;  Laterality: Left;    Current Outpatient Medications on File Prior to Visit  Medication Sig Dispense Refill   acetaminophen  (TYLENOL ) 500 MG tablet Take 1-2 tablets (500-1,000 mg total) by mouth every 4 (four) hours as needed for moderate pain. 30 tablet 0   betamethasone  valerate (VALISONE )  0.1 % cream Apply topically 2 (two) times daily. 30 g 0   calcium carbonate (OSCAL) 1500 (600 Ca) MG TABS tablet Take by mouth daily.     cyclobenzaprine  (FLEXERIL ) 5 MG tablet TAKE 1 TABLET BY MOUTH THREE TIMES A DAY AS NEEDED FOR MUSCLE SPASM 30 tablet 3   Docusate Sodium  (DSS) 100 MG CAPS Take by mouth.     gabapentin  (NEURONTIN ) 100 MG capsule Take 100 mg by mouth 3 (three) times daily.     metoprolol  succinate (TOPROL -XL) 50 MG 24 hr tablet TAKE 1 TABLET BY MOUTH EVERY DAY (Patient taking differently: Take 50 mg by mouth in the morning and at bedtime.) 90 tablet 3   Misc Natural Products (BEET ROOT PO) Take by mouth.     moxifloxacin (VIGAMOX) 0.5 % ophthalmic solution Place 1 drop into the left eye 3 (three) times daily.     Multiple Vitamin (MULTIVITAMIN) tablet Take 1 tablet by mouth daily.     prednisoLONE acetate (PRED FORTE) 1 % ophthalmic suspension Place 1 drop into the left eye every morning.     UNABLE TO FIND Med Name: hair growth vitamin     gabapentin  (NEURONTIN ) 300 MG capsule Take 1 capsule (300 mg total) by mouth 3 (three) times daily. (Patient not taking: Reported on 01/08/2024) 90 capsule 4   No current facility-administered medications on file prior to visit.    Social History   Socioeconomic History   Marital status: Widowed    Spouse name: Not on file   Number of children: Not on file   Years of education: Not on file   Highest education level: Bachelor's degree (e.g., BA, AB, BS)  Occupational History   Not on file  Tobacco Use   Smoking status: Never   Smokeless tobacco: Never  Vaping Use   Vaping status: Never Used  Substance and Sexual Activity   Alcohol  use: Not Currently   Drug use: No   Sexual activity: Not Currently    Partners: Male    Birth control/protection: Post-menopausal    Comment: 1st intercourse- 19, partners- 4  Other Topics Concern   Not on file  Social History Narrative   Widowed in summer 2020   Former smoker no alcohol  or drug  use   Social Drivers of Corporate investment banker Strain: Low Risk  (01/05/2024)   Overall Financial Resource Strain (CARDIA)    Difficulty of Paying Living Expenses: Not hard at all  Food Insecurity: No Food Insecurity (01/05/2024)   Hunger Vital Sign    Worried About Running Out of Food in the  Last Year: Never true    Ran Out of Food in the Last Year: Never true  Transportation Needs: No Transportation Needs (01/05/2024)   PRAPARE - Administrator, Civil Service (Medical): No    Lack of Transportation (Non-Medical): No  Physical Activity: Sufficiently Active (01/05/2024)   Exercise Vital Sign    Days of Exercise per Week: 2 days    Minutes of Exercise per Session: 90 min  Stress: No Stress Concern Present (01/05/2024)   [employer] of Occupational Health - Occupational Stress Questionnaire    Feeling of Stress : Not at all  Social Connections: Moderately Integrated (01/05/2024)   Social Connection and Isolation Panel [NHANES]    Frequency of Communication with Friends and Family: More than three times a week    Frequency of Social Gatherings with Friends and Family: Three times a week    Attends Religious Services: 1 to 4 times per year    Active Member of Clubs or Organizations: Yes    Attends Banker Meetings: More than 4 times per year    Marital Status: Widowed  Catering manager Violence: Not on file    Family History  Problem Relation Age of Onset   Hypertension Mother    Heart disease Mother    Diabetes Mother    Kidney disease Mother    Hypertension Father    Colon cancer Neg Hx    Colon polyps Neg Hx    Esophageal cancer Neg Hx    Stomach cancer Neg Hx    Rectal cancer Neg Hx      Allergies  Allergen Reactions   Hydrocodone  Nausea And Vomiting      Patient's last menstrual period was Patient's last menstrual period was 05/01/2009.Aaron Aas             Review of Systems Alls systems reviewed and are negative.     Physical  Exam Constitutional:      Appearance: Normal appearance.  Genitourinary:     Vulva and urethral meatus normal.     No lesions in the vagina.     Right Labia: No rash, lesions or skin changes.    Left Labia: No lesions, skin changes or rash.    No vaginal discharge or tenderness.     No vaginal prolapse present.    No vaginal atrophy present.     Right Adnexa: not tender, not palpable and no mass present.    Left Adnexa: not tender, not palpable and no mass present.    No cervical motion tenderness or discharge.     Uterus is not enlarged, tender or irregular.  Breasts:    Right: Normal.     Left: Normal.  HENT:     Head: Normocephalic.  Neck:     Thyroid: No thyroid mass, thyromegaly or thyroid tenderness.  Cardiovascular:     Rate and Rhythm: Normal rate and regular rhythm.     Heart sounds: Normal heart sounds, S1 normal and S2 normal.  Pulmonary:     Effort: Pulmonary effort is normal.     Breath sounds: Normal breath sounds and air entry.  Abdominal:     General: There is no distension.     Palpations: Abdomen is soft. There is no mass.     Tenderness: There is no abdominal tenderness. There is no guarding or rebound.  Musculoskeletal:        General: Normal range of motion.     Cervical back: Full passive range of  motion without pain, normal range of motion and neck supple. No tenderness.     Right lower leg: No edema.     Left lower leg: No edema.  Neurological:     Mental Status: She is alert.  Skin:    General: Skin is warm.  Psychiatric:        Mood and Affect: Mood normal.        Behavior: Behavior normal.        Thought Content: Thought content normal.  Vitals and nursing note reviewed. Exam conducted with a chaperone present.       A:         Well Woman GYN exam                             P:        Pap smear collected today Encouraged annual mammogram screening Colon cancer screening up-to-date DXA ordered today repeat concern for error as she  was severe osteoporosis to osteopenia without treatment. Labs and immunizations to do with PMD Discussed breast self exams Encouraged healthy lifestyle practices Encouraged Vit D and Calcium   No follow-ups on file.  Reinaldo Caras

## 2024-01-12 ENCOUNTER — Encounter: Payer: Self-pay | Admitting: Internal Medicine

## 2024-01-13 LAB — CYTOLOGY - PAP: Diagnosis: NEGATIVE

## 2024-01-14 ENCOUNTER — Ambulatory Visit: Payer: Self-pay | Admitting: Obstetrics and Gynecology

## 2024-01-22 ENCOUNTER — Encounter: Payer: Self-pay | Admitting: Dermatology

## 2024-01-22 ENCOUNTER — Ambulatory Visit: Admitting: Dermatology

## 2024-01-22 VITALS — BP 163/70 | HR 87

## 2024-01-22 DIAGNOSIS — L578 Other skin changes due to chronic exposure to nonionizing radiation: Secondary | ICD-10-CM | POA: Diagnosis not present

## 2024-01-22 DIAGNOSIS — W908XXA Exposure to other nonionizing radiation, initial encounter: Secondary | ICD-10-CM | POA: Diagnosis not present

## 2024-01-22 DIAGNOSIS — D492 Neoplasm of unspecified behavior of bone, soft tissue, and skin: Secondary | ICD-10-CM

## 2024-01-22 DIAGNOSIS — Z1283 Encounter for screening for malignant neoplasm of skin: Secondary | ICD-10-CM

## 2024-01-22 DIAGNOSIS — S0001XA Abrasion of scalp, initial encounter: Secondary | ICD-10-CM

## 2024-01-22 DIAGNOSIS — Z808 Family history of malignant neoplasm of other organs or systems: Secondary | ICD-10-CM

## 2024-01-22 DIAGNOSIS — L821 Other seborrheic keratosis: Secondary | ICD-10-CM

## 2024-01-22 DIAGNOSIS — L814 Other melanin hyperpigmentation: Secondary | ICD-10-CM

## 2024-01-22 DIAGNOSIS — D229 Melanocytic nevi, unspecified: Secondary | ICD-10-CM

## 2024-01-22 DIAGNOSIS — D1801 Hemangioma of skin and subcutaneous tissue: Secondary | ICD-10-CM

## 2024-01-22 DIAGNOSIS — D485 Neoplasm of uncertain behavior of skin: Secondary | ICD-10-CM

## 2024-01-22 DIAGNOSIS — T148XXA Other injury of unspecified body region, initial encounter: Secondary | ICD-10-CM

## 2024-01-22 MED ORDER — CLOBETASOL PROPIONATE 0.05 % EX SOLN: 1.0000 | Freq: Two times a day (BID) | CUTANEOUS | 3 refills | Status: DC

## 2024-01-22 MED ORDER — CLINDAMYCIN PHOSPHATE 1 % EX SOLN: Freq: Every day | CUTANEOUS | 3 refills | Status: DC

## 2024-01-22 NOTE — Patient Instructions (Signed)

## 2024-01-22 NOTE — Progress Notes (Signed)
   New Patient Visit   Subjective  Cristina Conner is a 63 y.o. female who presents for the following: Skin Cancer Screening and Full Body Skin Exam. No hx of skin caner. Family hx of NMSC. 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   The patient presents for Total-Body Skin Exam (TBSE) for skin cancer screening and mole check. The patient has spots, moles and lesions to be evaluated, some may be new or changing.  The following portions of the chart were reviewed this encounter and updated as appropriate: medications, allergies, medical history  Review of Systems:  No other skin or systemic complaints except as noted in HPI or Assessment and Plan.  Objective  Well appearing patient in no apparent distress; mood and affect are within normal limits.  A full examination was performed including scalp, head, eyes, ears, nose, lips, neck, chest, axillae, abdomen, back, buttocks, bilateral upper extremities, bilateral lower extremities, hands, feet, fingers, toes, fingernails, and toenails. All findings within normal limits unless otherwise noted below.   Relevant physical exam findings are noted in the Assessment and Plan.    Assessment & Plan   SKIN CANCER SCREENING PERFORMED TODAY.  ACTINIC DAMAGE - Chronic condition, secondary to cumulative UV/sun exposure - diffuse scaly erythematous macules with underlying dyspigmentation - Recommend daily broad spectrum sunscreen SPF 30+ to sun-exposed areas, reapply every 2 hours as needed.  - Staying in the shade or wearing long sleeves, sun glasses (UVA+UVB protection) and wide brim hats (4-inch brim around the entire circumference of the hat) are also recommended for sun protection.  - Call for new or changing lesions.  LENTIGINES, SEBORRHEIC KERATOSES, HEMANGIOMAS - Benign  normal skin lesions - Benign-appearing - Call for any changes  MELANOCYTIC NEVI - Tan-brown and/or pink-flesh-colored symmetric macules and papules - Benign appearing on exam today - Observation - Call clinic for new or changing moles - Recommend daily use of broad spectrum spf 30+ sunscreen to sun-exposed areas.   Neoplasm of Skin- semi form, yellow pink nodule on central chest - Will get ultrasound to investigate soft tissue aspect of lesion - Will follow up and consider WLE based on ultrasound results  EXCORIATION Exam: Excoriation on the mid frontal scalp  Treatment Plan: Discussed this may be due to nerve damage from previous neuroma surgery Recommend topical steroid for local itch control EXCORIATION Scalp clobetasol  (TEMOVATE ) 0.05 % external solution - Scalp Apply 1 Application topically 2 (two) times daily.  clindamycin  (CLEOCIN  T) 1 % external solution - Scalp Apply topically daily. 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  I, Haig Levan, Surg Tech III, am acting as scribe for Deneise Finlay, MD.   Documentation: I have reviewed the above documentation for accuracy and completeness, and I agree with the above.  Deneise Finlay, MD

## 2024-01-26 ENCOUNTER — Encounter: Payer: Self-pay | Admitting: Obstetrics and Gynecology

## 2024-02-02 NOTE — Telephone Encounter (Signed)
 Frax score calculations reviewed with patient. Pertinent positive is her father's hip fracture  19%/1.4%  Discussed with normal frax to repeat bone scan in 2 years Patient agreed  Dr. Tia Flowers

## 2024-02-03 ENCOUNTER — Ambulatory Visit (HOSPITAL_BASED_OUTPATIENT_CLINIC_OR_DEPARTMENT_OTHER)

## 2024-02-06 ENCOUNTER — Ambulatory Visit (HOSPITAL_BASED_OUTPATIENT_CLINIC_OR_DEPARTMENT_OTHER)
Admission: RE | Admit: 2024-02-06 | Discharge: 2024-02-06 | Disposition: A | Discharge status: Home / Self Care | Source: Ambulatory Visit | Attending: Dermatology | Admitting: Dermatology

## 2024-02-06 DIAGNOSIS — D485 Neoplasm of uncertain behavior of skin: Secondary | ICD-10-CM | POA: Insufficient documentation

## 2024-02-15 ENCOUNTER — Ambulatory Visit: Payer: Self-pay | Admitting: Dermatology

## 2024-04-09 ENCOUNTER — Other Ambulatory Visit: Payer: Self-pay | Admitting: Physical Medicine & Rehabilitation

## 2024-04-09 DIAGNOSIS — G510 Bell's palsy: Secondary | ICD-10-CM

## 2024-04-09 DIAGNOSIS — D333 Benign neoplasm of cranial nerves: Secondary | ICD-10-CM

## 2024-05-08 ENCOUNTER — Other Ambulatory Visit: Payer: Self-pay | Admitting: Physical Medicine & Rehabilitation

## 2024-05-08 DIAGNOSIS — G894 Chronic pain syndrome: Secondary | ICD-10-CM

## 2024-06-09 ENCOUNTER — Encounter: Payer: Self-pay | Admitting: Physical Medicine & Rehabilitation

## 2024-06-09 ENCOUNTER — Ambulatory Visit: Payer: Self-pay

## 2024-06-09 ENCOUNTER — Encounter: Attending: Physical Medicine & Rehabilitation | Admitting: Physical Medicine & Rehabilitation

## 2024-06-09 VITALS — BP 173/95 | HR 78 | Ht 64.0 in | Wt 165.8 lb

## 2024-06-09 DIAGNOSIS — D333 Benign neoplasm of cranial nerves: Secondary | ICD-10-CM | POA: Diagnosis not present

## 2024-06-09 DIAGNOSIS — G510 Bell's palsy: Secondary | ICD-10-CM | POA: Diagnosis present

## 2024-06-09 NOTE — Progress Notes (Signed)
 Subjective:    Patient ID: Cristina Conner, female    DOB: 10-07-1960, 63 y.o.   MRN: [id number]  HPI  Karlissa is here in follow up of her vestibular schwannoma and associated pain issues.  She really has been doing fairly well since I last saw her in the spring.  We increased her gabapentin  to 300 mg 3 times daily which seems to be working nicely for her.  She takes Flexeril  usually at bedtime as well.  She is using less Tylenol  now as a result.  She does feel a bit sleepy at times but that is tolerable.  Her left eye has remained stable.  She remains follow-up with [facility].  She has noticed her blood pressure has been a bit elevated the last 2 weeks.  It was elevated today at 170/95 here in the office.  She has not changed any medications or things in her diet.  She has been dealing with a lot of stress related to a major remodel at her home which has been going on for a few months.   Pain Inventory Average Pain 3 Pain Right Now 1 My pain is intermittent and dull  In the last 24 hours, has pain interfered with the following? General activity 0 Relation with others 0 Enjoyment of life 0 What TIME of day is your pain at its worst? evening Sleep (in general) Fair  Pain is worse with: bending Pain improves with: rest and medication Relief from Meds: 10  Family History  Problem Relation Age of Onset   Hypertension Mother    Heart disease Mother    Diabetes Mother    Kidney disease Mother    Hypertension Father    Colon cancer Neg Hx    Colon polyps Neg Hx    Esophageal cancer Neg Hx    Stomach cancer Neg Hx    Rectal cancer Neg Hx    Social History   Socioeconomic History   Marital status: Widowed    Spouse name: Not on file   Number of children: Not on file   Years of education: Not on file   Highest education level: Bachelor's degree (e.g., BA, AB, BS)  Occupational History   Not on file  Tobacco Use   Smoking status: Never   Smokeless tobacco: Never   Vaping Use   Vaping status: Never Used  Substance and Sexual Activity   Alcohol  use: Not Currently   Drug use: No   Sexual activity: Not Currently    Partners: Male    Birth control/protection: Post-menopausal    Comment: 1st intercourse- 19, partners- 4  Other Topics Concern   Not on file  Social History Narrative   Widowed in summer 2020   Former smoker no alcohol  or drug use   Social Drivers of Corporate investment banker Strain: Low Risk  (01/05/2024)   Overall Financial Resource Strain (CARDIA)    Difficulty of Paying Living Expenses: Not hard at all  Food Insecurity: No Food Insecurity (01/05/2024)   Hunger Vital Sign    Worried About Running Out of Food in the Last Year: Never true    Ran Out of Food in the Last Year: Never true  Transportation Needs: No Transportation Needs (01/05/2024)   PRAPARE - Administrator, Civil Service (Medical): No    Lack of Transportation (Non-Medical): No  Physical Activity: Sufficiently Active (01/05/2024)   Exercise Vital Sign    Days of Exercise per Week: 2  days    Minutes of Exercise per Session: 90 min  Stress: No Stress Concern Present (01/05/2024)   [employer] of Occupational Health - Occupational Stress Questionnaire    Feeling of Stress : Not at all  Social Connections: Moderately Integrated (01/05/2024)   Social Connection and Isolation Panel    Frequency of Communication with Friends and Family: More than three times a week    Frequency of Social Gatherings with Friends and Family: Three times a week    Attends Religious Services: 1 to 4 times per year    Active Member of Clubs or Organizations: Yes    Attends Banker Meetings: More than 4 times per year    Marital Status: Widowed   Past Surgical History:  Procedure Laterality Date   BRAIN SURGERY N/A    Phreesia 07/22/2020   CESAREAN SECTION     x 2   COLONOSCOPY     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   at [facility]   CRANIOTOMY Left  01/27/2020   Procedure: Left Craniotomy for Tumor Resection;  Surgeon: Cheryle Debby LABOR, MD;  Location: [facility] OR;  Service: Neurosurgery;  Laterality: Left;   ENDOMETRIAL ABLATION     ESOPHAGOGASTRODUODENOSCOPY  10/2019   LAPAROSCOPY N/A 01/19/2020   Procedure: LAPAROSCOPY DIAGNOSTIC LAPAROTOMY WITH SMALL BOWEL RESECTION;  Surgeon: Teresa Lonni HERO, MD;  Location: [facility] ORS;  Service: General;  Laterality: N/A;   PELVIC LAPAROSCOPY  1999   left salpingectomy, excision of Right, paratubal cyst   PELVIC LAPAROSCOPY     laparoscopy with lysis of pelvic adhesions   SHOULDER SURGERY  11/2010   FROZEN SHOULDER   TOTAL HIP ARTHROPLASTY Left 08/17/2019   Procedure: LEFT TOTAL HIP ARTHROPLASTY ANTERIOR APPROACH;  Surgeon: Sheril Coy, MD;  Location: [facility];  Service: Orthopedics;  Laterality: Left;   TUBAL LIGATION     UPPER GASTROINTESTINAL ENDOSCOPY     VENTRICULOSTOMY Left 01/27/2020   Procedure: VENTRICULOSTOMY;  Surgeon: Cheryle Debby LABOR, MD;  Location: [facility] OR;  Service: Neurosurgery;  Laterality: Left;   Past Surgical History:  Procedure Laterality Date   BRAIN SURGERY N/A    Phreesia 07/22/2020   CESAREAN SECTION     x 2   COLONOSCOPY     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   at [facility]   CRANIOTOMY Left 01/27/2020   Procedure: Left Craniotomy for Tumor Resection;  Surgeon: Cheryle Debby LABOR, MD;  Location: [facility] OR;  Service: Neurosurgery;  Laterality: Left;   ENDOMETRIAL ABLATION     ESOPHAGOGASTRODUODENOSCOPY  10/2019   LAPAROSCOPY N/A 01/19/2020   Procedure: LAPAROSCOPY DIAGNOSTIC LAPAROTOMY WITH SMALL BOWEL RESECTION;  Surgeon: Teresa Lonni HERO, MD;  Location: [facility];  Service: General;  Laterality: N/A;   PELVIC LAPAROSCOPY  1999   left salpingectomy, excision of Right, paratubal cyst   PELVIC LAPAROSCOPY     laparoscopy with lysis of pelvic adhesions   SHOULDER SURGERY  11/2010   FROZEN SHOULDER   TOTAL HIP ARTHROPLASTY Left 08/17/2019   Procedure: LEFT TOTAL  HIP ARTHROPLASTY ANTERIOR APPROACH;  Surgeon: Sheril Coy, MD;  Location: [facility];  Service: Orthopedics;  Laterality: Left;   TUBAL LIGATION     UPPER GASTROINTESTINAL ENDOSCOPY     VENTRICULOSTOMY Left 01/27/2020   Procedure: VENTRICULOSTOMY;  Surgeon: Cheryle Debby LABOR, MD;  Location: [facility] OR;  Service: Neurosurgery;  Laterality: Left;   Past Medical History:  Diagnosis Date   Anxiety    Brain tumor [facility])    vestibular neuroma   Diverticulosis  Hypertension    Osteopenia    Osteoporosis    Post-operative nausea and vomiting    vomiting after general anesthesia per pt.   Pre-diabetes    BP (!) 173/95   Pulse 78   Ht 5' 4 (1.626 m)   Wt 165 lb 12.8 oz (75.2 kg)   LMP 05/01/2009 Comment: not sexually active  SpO2 98%   BMI 28.46 kg/m   Opioid Risk Score:   Fall Risk Score:  `1  Depression screen [facility] 2/9     06/09/2024   11:03 AM 01/08/2024   10:23 AM 01/09/2023   10:34 AM 09/04/2022   10:41 AM 07/30/2022    3:40 PM 03/26/2022    9:38 AM 02/27/2022   11:07 AM  Depression screen PHQ 2/9  Decreased Interest 0 0 0 0 0 0 0  Down, Depressed, Hopeless 0 0 0 0 0 0 0  PHQ - 2 Score 0 0 0 0 0 0 0  Altered sleeping   0  0 1   Tired, decreased energy   0  0 0   Change in appetite   0  0 0   Feeling bad or failure about yourself    0  0 0   Trouble concentrating   0  0 0   Moving slowly or fidgety/restless   0  0 0   Suicidal thoughts   0  0 0   PHQ-9 Score   0  0 1   Difficult doing work/chores   Not difficult at all  Not difficult at all      Review of Systems  Musculoskeletal:  Positive for neck pain.  Neurological:  Positive for headaches.  All other systems reviewed and are negative.      Objective:   Physical Exam General: No acute distress HEENT: NCAT, EOMI, oral membranes moist.  Left eye is clear Cards: reg rate  Chest: normal effort Abdomen: Soft, NT, ND Skin: dry, intact Extremities: no edema Psych: pleasant and appropriate   Neuro: closes eye lid  almost shut with extra effort. Less pronounced  . Left facial droop, tongue deviation stable. Numbness along left hemi-face remains. Balance is stable/good  Strength is grossly 5 out of 5 in all 4 limbs.. left sided hearing loss present.  Musculoskeletal: No focal deficits.  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              -she stays active 2.  Pain: generally much improved.             - tramadol  for severe pain only.  She can keep her supply at home.  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 Local eye care            -contacts per ophthamollogy helping 4. ABLA:   iron /B complex  with mulit-vitamin  5. HTN:             -metoprolol . Primary following  -I suspect that some of the stress surrounding her large house project may be contributing. She will check with her PCP next month 6. Left hip pain s/p THR 12/20:              -stable 7. Vestibular dysfunction: much improved   20  minutes of face to face patient care time were spent during this visit. All questions were encouraged and answered.  Follow up with me in 12 mos.

## 2024-06-09 NOTE — Telephone Encounter (Signed)
 FYI Only or Action Required?: FYI only for provider.  Patient was last seen in primary care on 01/06/2024 by Rollene Almarie LABOR, MD.  Called Nurse Triage reporting Hypertension.  Symptoms began yesterday.  Interventions attempted: Rest, hydration, or home remedies.  Symptoms are: unchanged.  Triage Disposition: See Physician Within 24 Hours  Patient/caregiver understands and will follow disposition?: Yes  Copied from CRM [id number]. Topic: Clinical - Red Word Triage >> Jun 09, 2024  4:43 PM Jasmin G wrote: Red Word that prompted transfer to Nurse Triage: Pt states that she was told to call if her bp continued to spike throughout the day, which it has, highest being at 189/117. Reason for Disposition  Systolic BP >= 180 OR Diastolic >= 110  Answer Assessment - Initial Assessment Questions Pt states she had appointment this AM and bp was elevated to 189/117 at beginning of visit. At the end of visit it was 173/95. States she takes metoprolol  50mg  BID. Was told by PCP to schedule appointment if BP remains high. Pt admits to intermittent HA nausea and dizziness. Takes BP at home.   1. BLOOD PRESSURE: What is your blood pressure? Did you take at least two measurements 5 minutes apart?     189/117, was this AM, and then after it was back down 173/95 2. ONSET: When did you take your blood pressure?     Today 3. HOW: How did you take your blood pressure? (e.g., automatic home BP monitor, visiting nurse)     OV 4. HISTORY: Do you have a history of high blood pressure?     Yes, takes metoprolol  5. MEDICINES: Are you taking any medicines for blood pressure? Have you missed any doses recently?     Metoprolol . 50mg  BID 6. OTHER SYMPTOMS: Do you have any symptoms? (e.g., blurred vision, chest pain, difficulty breathing, headache, weakness)     HA nausea occasional dizziness  Protocols used: Blood Pressure - High-A-AH

## 2024-06-09 NOTE — Patient Instructions (Signed)
 ALWAYS FEEL FREE TO CALL OUR OFFICE WITH ANY PROBLEMS OR QUESTIONS [phone])  **PLEASE NOTE** ALL MEDICATION REFILL REQUESTS (INCLUDING CONTROLLED SUBSTANCES) NEED TO BE MADE AT LEAST 7 DAYS PRIOR TO REFILL BEING DUE. ANY REFILL REQUESTS INSIDE THAT TIME FRAME MAY RESULT IN DELAYS IN RECEIVING YOUR PRESCRIPTION.

## 2024-06-10 ENCOUNTER — Ambulatory Visit: Payer: Self-pay | Admitting: Family Medicine

## 2024-06-10 ENCOUNTER — Ambulatory Visit: Admitting: Family Medicine

## 2024-06-10 VITALS — BP 150/90 | HR 84 | Temp 97.6°F | Ht 64.0 in | Wt 165.8 lb

## 2024-06-10 DIAGNOSIS — K219 Gastro-esophageal reflux disease without esophagitis: Secondary | ICD-10-CM

## 2024-06-10 DIAGNOSIS — I10 Essential (primary) hypertension: Secondary | ICD-10-CM | POA: Diagnosis not present

## 2024-06-10 DIAGNOSIS — K5900 Constipation, unspecified: Secondary | ICD-10-CM

## 2024-06-10 LAB — COMPREHENSIVE METABOLIC PANEL WITH GFR
ALT: 12 U/L (ref 0–35)
AST: 12 U/L (ref 0–37)
Albumin: 4.4 g/dL (ref 3.5–5.2)
Alkaline Phosphatase: 67 U/L (ref 39–117)
BUN: 16 mg/dL (ref 6–23)
CO2: 31 meq/L (ref 19–32)
Calcium: 9.4 mg/dL (ref 8.4–10.5)
Chloride: 101 meq/L (ref 96–112)
Creatinine, Ser: 0.83 mg/dL (ref 0.40–1.20)
GFR: 75.77 mL/min (ref >60.00)
Glucose, Bld: 113 mg/dL — ABNORMAL HIGH (ref 70–99)
Potassium: 4.3 meq/L (ref 3.5–5.1)
Sodium: 138 meq/L (ref 135–145)
Total Bilirubin: 0.4 mg/dL (ref 0.2–1.2)
Total Protein: 7.3 g/dL (ref 6.0–8.3)

## 2024-06-10 LAB — CBC WITH DIFFERENTIAL/PLATELET
Basophils Absolute: 0 K/uL (ref 0.0–0.1)
Basophils Relative: 0.5 % (ref 0.0–3.0)
Eosinophils Absolute: 0.1 K/uL (ref 0.0–0.7)
Eosinophils Relative: 1.5 % (ref 0.0–5.0)
HCT: 43 % (ref 36.0–46.0)
Hemoglobin: 13.8 g/dL (ref 12.0–15.0)
Lymphocytes Relative: 16.2 % (ref 12.0–46.0)
Lymphs Abs: 1 K/uL (ref 0.7–4.0)
MCHC: 32.2 g/dL (ref 30.0–36.0)
MCV: 85 fl (ref 78.0–100.0)
Monocytes Absolute: 0.3 K/uL (ref 0.1–1.0)
Monocytes Relative: 4.7 % (ref 3.0–12.0)
Neutro Abs: 4.8 K/uL (ref 1.4–7.7)
Neutrophils Relative %: 77.1 % — ABNORMAL HIGH (ref 43.0–77.0)
Platelets: 283 K/uL (ref 150.0–400.0)
RBC: 5.06 Mil/uL (ref 3.87–5.11)
RDW: 14.5 % (ref 11.5–15.5)
WBC: 6.3 K/uL (ref 4.0–10.5)

## 2024-06-10 LAB — MICROALBUMIN / CREATININE URINE RATIO
Creatinine,U: 89.5 mg/dL
Microalb Creat Ratio: 10 mg/g (ref 0.0–30.0)
Microalb, Ur: 0.9 mg/dL (ref 0.0–1.9)

## 2024-06-10 MED ORDER — HYDROCHLOROTHIAZIDE 12.5 MG PO CAPS
12.5000 mg | ORAL_CAPSULE | Freq: Every day | ORAL | 1 refills | Status: DC
Start: 2024-06-10 — End: 2024-06-28

## 2024-06-10 NOTE — Patient Instructions (Addendum)
 Recommend pepcid  twice day for 2 weeks, then once a day afterward.  I have sent in hydrochlorothiazide for you to take once daily in the morning.   We are checking labs today, will be in contact with any results that require further attention  Follow up as scheduled

## 2024-06-10 NOTE — Progress Notes (Signed)
 "  Acute Office Visit  Subjective:     Patient ID: Cristina Conner, female    DOB: May 24, 1961, 63 y.o.   MRN: [id number]  Chief Complaint  Patient presents with   Acute Visit    x1 week having HA, lightheaded, dizzy, belching and stomach bloating, elevated BP    HPI  Discussed the use of AI scribe software for clinical note transcription with the patient, who gave verbal consent to proceed.  History of Present Illness Cristina Conner is a 63 year old female with primary hypertension who presents with elevated blood pressure and associated symptoms.  Elevated blood pressure and associated symptoms - Elevated blood pressure over the past week, with readings in the high 140s/80s compared to baseline of 130s/70s to low 80s - Mild headaches, lightheadedness, and dizziness present, not position-dependent - No vision changes - Recent blood pressure check at another office also showed elevated readings - Takes metoprolol  twice daily for hypertension  Gastrointestinal symptoms - Increased stomach bloating and belching - Sensation of tasting food when burping, suggestive of acid reflux - Pepcid  provides some relief - Unusually bloated abdomen - Difficulty with bowel movements, improved after consuming apple and prune juice - No swelling in feet or hands  Neurological and musculoskeletal symptoms - History of vestibular schwannoma, previously caused significant weight loss due to vomiting; current weight is 160 pounds - Takes a muscle relaxer and gabapentin  for facial tightness experienced in the evenings     ROS Per HPI      Objective:    BP (!) 150/90 (BP Location: Left Arm, Patient Position: Sitting)   Pulse 84   Temp 97.6 F (36.4 C) (Temporal)   Ht 5' 4 (1.626 m)   Wt 165 lb 12.8 oz (75.2 kg)   LMP 05/01/2009 Comment: not sexually active  SpO2 90%   BMI 28.46 kg/m    Physical Exam Vitals and nursing note reviewed.  Constitutional:      General: She is not in  acute distress.    Appearance: Normal appearance.  HENT:     Head: Atraumatic.     Comments: L facial palsy    Right Ear: External ear normal.     Left Ear: External ear normal.     Nose: Nose normal.     Mouth/Throat:     Mouth: Mucous membranes are moist.     Pharynx: Oropharynx is clear.  Eyes:     Extraocular Movements: Extraocular movements intact.     Pupils: Pupils are equal, round, and reactive to light.  Neck:     Vascular: No carotid bruit.  Cardiovascular:     Rate and Rhythm: Normal rate and regular rhythm.     Pulses: Normal pulses.     Heart sounds: Normal heart sounds.  Pulmonary:     Effort: Pulmonary effort is normal. No respiratory distress.     Breath sounds: Normal breath sounds. No wheezing, rhonchi or rales.  Musculoskeletal:        General: Normal range of motion.     Cervical back: Normal range of motion.     Right lower leg: No edema.     Left lower leg: No edema.  Lymphadenopathy:     Cervical: No cervical adenopathy.  Neurological:     General: No focal deficit present.     Mental Status: She is alert and oriented to person, place, and time.  Psychiatric:        Mood and Affect: Mood normal.  Thought Content: Thought content normal.     Results for orders placed or performed in visit on 06/10/24  CBC with Differential/Platelet  Result Value Ref Range   WBC 6.3 4.0 - 10.5 K/uL   RBC 5.06 3.87 - 5.11 Mil/uL   Hemoglobin 13.8 12.0 - 15.0 g/dL   HCT 56.9 63.9 - 53.9 %   MCV 85.0 78.0 - 100.0 fl   MCHC 32.2 30.0 - 36.0 g/dL   RDW 85.4 88.4 - 84.4 %   Platelets 283.0 150.0 - 400.0 K/uL   Neutrophils Relative % 77.1 (H) 43.0 - 77.0 %   Lymphocytes Relative 16.2 12.0 - 46.0 %   Monocytes Relative 4.7 3.0 - 12.0 %   Eosinophils Relative 1.5 0.0 - 5.0 %   Basophils Relative 0.5 0.0 - 3.0 %   Neutro Abs 4.8 1.4 - 7.7 K/uL   Lymphs Abs 1.0 0.7 - 4.0 K/uL   Monocytes Absolute 0.3 0.1 - 1.0 K/uL   Eosinophils Absolute 0.1 0.0 - 0.7 K/uL    Basophils Absolute 0.0 0.0 - 0.1 K/uL  Comprehensive metabolic panel with GFR  Result Value Ref Range   Sodium 138 135 - 145 mEq/L   Potassium 4.3 3.5 - 5.1 mEq/L   Chloride 101 96 - 112 mEq/L   CO2 31 19 - 32 mEq/L   Glucose, Bld 113 (H) 70 - 99 mg/dL   BUN 16 6 - 23 mg/dL   Creatinine, Ser 9.16 0.40 - 1.20 mg/dL   Total Bilirubin 0.4 0.2 - 1.2 mg/dL   Alkaline Phosphatase 67 39 - 117 U/L   AST 12 0 - 37 U/L   ALT 12 0 - 35 U/L   Total Protein 7.3 6.0 - 8.3 g/dL   Albumin  4.4 3.5 - 5.2 g/dL   GFR 24.22 >39.99 mL/min   Calcium 9.4 8.4 - 10.5 mg/dL  Microalbumin / creatinine urine ratio  Result Value Ref Range   Microalb, Ur 0.9 0.0 - 1.9 mg/dL   Creatinine,U 10.4 mg/dL   Microalb Creat Ratio 10.0 0.0 - 30.0 mg/g        Assessment & Plan:   Assessment and Plan Assessment & Plan Primary hypertension Recent blood pressure elevation with mild headaches, lightheadedness, and dizziness. Blood pressure previously controlled with metoprolol . Possible fluid retention. - Prescribed low dose diuretic in the morning. - Ordered labs for electrolytes and kidney function. - Instructed to monitor blood pressure and symptoms, report worsening or adverse effects.  Gastroesophageal reflux disease without esophagitis Increased bloating and belching, managed with Pepcid . Stress from hypertension may exacerbate symptoms. - Continue Pepcid  twice daily for two weeks, then once daily for two weeks, then as needed. - Provided dietary information for reflux management.  Constipation Abdominal bloating and suboptimal bowel movements, temporary relief with juices. - Monitor bowel movements and bloating symptoms.     Orders Placed This Encounter  Procedures   CBC with Differential/Platelet    Release to patient:   Immediate [1]   Comprehensive metabolic panel with GFR    Release to patient:   Immediate [1]   Microalbumin / creatinine urine ratio    Release to patient:   Immediate      Meds ordered this encounter  Medications   hydrochlorothiazide  (MICROZIDE ) 12.5 MG capsule    Sig: Take 1 capsule (12.5 mg total) by mouth daily.    Dispense:  30 capsule    Refill:  1    Return for about a month with Dr Rollene as scheduled.  [name] LITTIE Ku, FNP  "

## 2024-06-11 ENCOUNTER — Other Ambulatory Visit: Payer: Self-pay | Admitting: Internal Medicine

## 2024-06-11 DIAGNOSIS — K5900 Constipation, unspecified: Secondary | ICD-10-CM | POA: Insufficient documentation

## 2024-06-11 DIAGNOSIS — K219 Gastro-esophageal reflux disease without esophagitis: Secondary | ICD-10-CM | POA: Insufficient documentation

## 2024-06-11 MED ORDER — METOPROLOL SUCCINATE ER 50 MG PO TB24: 50.0000 mg | ORAL_TABLET | Freq: Two times a day (BID) | ORAL | 0 refills | Status: AC

## 2024-06-28 ENCOUNTER — Telehealth: Payer: Self-pay | Admitting: Family Medicine

## 2024-06-28 ENCOUNTER — Ambulatory Visit: Payer: Self-pay | Admitting: Internal Medicine

## 2024-06-28 ENCOUNTER — Other Ambulatory Visit: Payer: Self-pay | Admitting: Internal Medicine

## 2024-06-28 DIAGNOSIS — I10 Essential (primary) hypertension: Secondary | ICD-10-CM

## 2024-06-28 MED ORDER — HYDROCHLOROTHIAZIDE 25 MG PO TABS: 25.0000 mg | ORAL_TABLET | Freq: Every day | ORAL | 0 refills | Status: DC

## 2024-06-28 NOTE — Telephone Encounter (Signed)
 FYI Only or Action Required?: Action required by provider: medication refill request.  Patient was last seen in primary care on 06/10/2024 by Cristina Conner CROME, FNP.  Called Nurse Triage reporting Medication Refill.   Interventions attempted: Prescription medications: taking her hydrochlorothiazide twice daily.  Symptoms are: hypertension gradually improving. Patient also states at her next visit she would like to discuss her GERD with her PCP which has been improving on Pepcid .  Triage Disposition: Call PCP When Office is Open  Patient/caregiver understands and will follow disposition?: Yes                    Copied from CRM [id number]. Topic: Clinical - Medication Refill >> Jun 28, 2024  9:46 AM Tanazia G wrote: Medication:  hydrochlorothiazide (MICROZIDE) 12.5 MG capsule   Patient said she ran out because she has been doubling the medication due to the regular dosage not working.   Has the patient contacted their pharmacy? Yes (Agent: If no, request that the patient contact the pharmacy for the refill. If patient does not wish to contact the pharmacy document the reason why and proceed with request.) (Agent: If yes, when and what did the pharmacy advise?)   This is the patient's preferred pharmacy:  [facility] [phone] [place] [place], [place] - [address] [place] [address] Phone: [phone] Fax: [phone]   Is this the correct pharmacy for this prescription? Yes If no, delete pharmacy and type the correct one.    Has the prescription been filled recently? Yes   Is the patient out of the medication? Yes   Has the patient been seen for an appointment in the last year OR does the patient have an upcoming appointment? Yes   Can we respond through MyChart? Yes   Agent: Please be advised that Rx refills may take up to 3 business days. We ask that you follow-up with your pharmacy. Reason for Disposition  [1] Caller has  NON-URGENT medicine question about med that doctor (or NP/PA) prescribed AND [2] triager unable to answer question  Answer Assessment - Initial Assessment Questions Patient has been taking the hydrochlorothiazide once in the AM and once in the PM; medication is prescribed once daily. She states it has been helping her BP. She reports before taking the medication her blood pressure readings were: 142/82, 150/98; on hydrochlorothiazide her readings have been 120/76, 121/72, 130 systolic BP occasionally. She states she needs a new prescription to be sent in to take twice daily so her insurance will cover it.  1. DRUG NAME: What medicine do you need to have refilled?     Hydrochlorothiazide  2. REFILLS REMAINING: How many refills are remaining? Notes: The label on the medicine or pill bottle will show how many refills are remaining. If there are no refills remaining, then a renewal may be needed.     1.  3. EXPIRATION DATE: What is the expiration date? Note: The label states when the prescription will expire, and thus can no longer be refilled.)     06/10/25.  4. PRESCRIBER: Who prescribed it? Note: The prescribing doctor or group is responsible for refill approvals.SABRA Conner Cristina.  5. PHARMACY: Have you contacted your pharmacy (drugstore)? Note: Some pharmacies will contact the doctor (or NP/PA).      Yes, pharmacy refilled the medication for her but she had to pay out of pocket due to it being refilled too soon. She states she is going to  pick up the medication later today or tomorrow.  6. SYMPTOMS: Do you have any symptoms?     She states she had to pack up her stuff in boxes and has not been able to check her BP in the last week due to her cuff is in one of the boxes and she has not found it yet. She states as far as her blood pressure she is feeling better. She states she has a follow up with Dr Rollene on 07/14/24 and has been having GERD for about 3 weeks and has been  taking OTC Pepcid  (reports it has been improving). She spoke with provider at last visit. She denies chest pain.  Protocols used: Medication Refill and Renewal Call-A-AH

## 2024-06-28 NOTE — Telephone Encounter (Signed)
 Called and informed pt of new rx sent and to bring readings to upcoming appt, pt voiced understanding

## 2024-06-28 NOTE — Telephone Encounter (Signed)
 Have sent in hydrochlorothiazide 25 mg daily which is the same dose as she is currently taking (2 of the 12.5 mg). Ok to take 25 mg daily until upcoming visit

## 2024-06-28 NOTE — Telephone Encounter (Signed)
 Please see nurse triage encounter addressing medication refill request for increased frequency of medication.

## 2024-06-28 NOTE — Telephone Encounter (Signed)
 Copied from CRM [id number]. Topic: Clinical - Medication Refill >> Jun 28, 2024  9:46 AM Tanazia G wrote: Medication:  hydrochlorothiazide (MICROZIDE) 12.5 MG capsule  Patient said she ran out because she has been doubling the medication due to the regular dosage not working.  Has the patient contacted their pharmacy? Yes (Agent: If no, request that the patient contact the pharmacy for the refill. If patient does not wish to contact the pharmacy document the reason why and proceed with request.) (Agent: If yes, when and what did the pharmacy advise?)  This is the patient's preferred pharmacy:  [phone] [address] [place] [place] - [address] [place] [address] Phone: [phone] Fax: [phone]  Is this the correct pharmacy for this prescription? Yes If no, delete pharmacy and type the correct one.   Has the prescription been filled recently? Yes  Is the patient out of the medication? Yes  Has the patient been seen for an appointment in the last year OR does the patient have an upcoming appointment? Yes  Can we respond through MyChart? Yes  Agent: Please be advised that Rx refills may take up to 3 business days. We ask that you follow-up with your pharmacy.

## 2024-07-03 ENCOUNTER — Emergency Department (HOSPITAL_BASED_OUTPATIENT_CLINIC_OR_DEPARTMENT_OTHER)
Admission: EM | Admit: 2024-07-03 | Discharge: 2024-07-03 | Disposition: A | Discharge status: Home / Self Care | Attending: Emergency Medicine | Admitting: Emergency Medicine

## 2024-07-03 ENCOUNTER — Other Ambulatory Visit: Payer: Self-pay

## 2024-07-03 ENCOUNTER — Emergency Department (HOSPITAL_BASED_OUTPATIENT_CLINIC_OR_DEPARTMENT_OTHER)

## 2024-07-03 ENCOUNTER — Encounter (HOSPITAL_BASED_OUTPATIENT_CLINIC_OR_DEPARTMENT_OTHER): Payer: Self-pay | Admitting: Emergency Medicine

## 2024-07-03 DIAGNOSIS — R109 Unspecified abdominal pain: Secondary | ICD-10-CM

## 2024-07-03 DIAGNOSIS — R1030 Lower abdominal pain, unspecified: Secondary | ICD-10-CM | POA: Diagnosis present

## 2024-07-03 LAB — CBC WITH DIFFERENTIAL/PLATELET
Abs Immature Granulocytes: 0.03 K/uL (ref 0.00–0.07)
Basophils Absolute: 0 K/uL (ref 0.0–0.1)
Basophils Relative: 0 %
Eosinophils Absolute: 0.1 K/uL (ref 0.0–0.5)
Eosinophils Relative: 1 %
HCT: 42.6 % (ref 36.0–46.0)
Hemoglobin: 13.9 g/dL (ref 12.0–15.0)
Immature Granulocytes: 0 %
Lymphocytes Relative: 13 %
Lymphs Abs: 1.4 K/uL (ref 0.7–4.0)
MCH: 27.6 pg (ref 26.0–34.0)
MCHC: 32.6 g/dL (ref 30.0–36.0)
MCV: 84.5 fL (ref 80.0–100.0)
Monocytes Absolute: 0.6 K/uL (ref 0.1–1.0)
Monocytes Relative: 6 %
Neutro Abs: 9.2 K/uL — ABNORMAL HIGH (ref 1.7–7.7)
Neutrophils Relative %: 80 %
Platelets: 282 K/uL (ref 150–400)
RBC: 5.04 MIL/uL (ref 3.87–5.11)
RDW: 13.2 % (ref 11.5–15.5)
WBC: 11.4 K/uL — ABNORMAL HIGH (ref 4.0–10.5)
nRBC: 0 % (ref 0.0–0.2)

## 2024-07-03 LAB — COMPREHENSIVE METABOLIC PANEL WITH GFR
ALT: 22 U/L (ref 0–44)
AST: 22 U/L (ref 15–41)
Albumin: 4.5 g/dL (ref 3.5–5.0)
Alkaline Phosphatase: 90 U/L (ref 38–126)
Anion gap: 12 (ref 5–15)
BUN: 12 mg/dL (ref 8–23)
CO2: 26 mmol/L (ref 22–32)
Calcium: 10.3 mg/dL (ref 8.9–10.3)
Chloride: 100 mmol/L (ref 98–111)
Creatinine, Ser: 0.87 mg/dL (ref 0.44–1.00)
GFR, Estimated: >60 mL/min (ref >60)
Glucose, Bld: 128 mg/dL — ABNORMAL HIGH (ref 70–99)
Potassium: 3.5 mmol/L (ref 3.5–5.1)
Sodium: 137 mmol/L (ref 135–145)
Total Bilirubin: 0.5 mg/dL (ref 0.0–1.2)
Total Protein: 7.6 g/dL (ref 6.5–8.1)

## 2024-07-03 LAB — LIPASE, BLOOD: Lipase: 36 U/L (ref 11–51)

## 2024-07-03 MED ORDER — SODIUM CHLORIDE 0.9 % IV BOLUS
500.0000 mL | Freq: Once | INTRAVENOUS | Status: AC
  Administered 2024-07-03: 500 mL via INTRAVENOUS

## 2024-07-03 MED ORDER — IOHEXOL 300 MG/ML  SOLN
100.0000 mL | Freq: Once | INTRAMUSCULAR | Status: AC | PRN
  Administered 2024-07-03: 100 mL via INTRAVENOUS

## 2024-07-03 MED ORDER — ONDANSETRON 4 MG PO TBDP: 4.0000 mg | ORAL_TABLET | Freq: Three times a day (TID) | ORAL | 0 refills | Status: AC | PRN

## 2024-07-03 NOTE — ED Triage Notes (Signed)
 Intermittent stabbing abdo pains x 3 weeks Sometimes lower sometime epigastric Reports burping Last BM today after prune juice   Last time I had symptoms like this they found a brain tumor, I just want to make sure

## 2024-07-03 NOTE — ED Provider Notes (Signed)
 [place] EMERGENCY DEPARTMENT AT [facility] Provider Note   CSN: [id number] Arrival date & time: 07/03/24  1112     Patient presents with: Abdominal Pain   Cristina Conner is a 63 y.o. female.    Abdominal Pain Patient presents for bowel pain.  Comes and goes.  Has had for around 3 weeks on and off.  Will come on it lasted 50 minutes.  Not associated with eating.  No diarrhea or constipation.  No change in bowel habits.  However last night has been more constant.  Has had a previous narrowing in her small intestine.  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  Has had surgery for that also.     Prior to Admission medications   Medication Sig Start Date End Date Taking? Authorizing Provider  ondansetron  (ZOFRAN -ODT) 4 MG disintegrating tablet Take 1 tablet (4 mg total) by mouth every 8 (eight) hours as needed. 07/03/24  Yes Patsey Lot, MD  acetaminophen  (TYLENOL ) 500 MG tablet Take 1-2 tablets (500-1,000 mg total) by mouth every 4 (four) hours as needed for moderate pain. 02/09/20   Love, Sharlet RAMAN, PA-C  calcium carbonate (OSCAL) 1500 (600 Ca) MG TABS tablet Take by mouth daily.    [provider]  cyclobenzaprine  (FLEXERIL ) 5 MG tablet TAKE 1 TABLET BY MOUTH THREE TIMES A DAY AS NEEDED FOR MUSCLE SPASM 04/13/24   Babs Arthea DASEN, MD  Docusate Sodium  (DSS) 100 MG CAPS Take by mouth. 09/18/21   [provider]  gabapentin  (NEURONTIN ) 300 MG capsule TAKE 1 CAPSULE BY MOUTH THREE TIMES A DAY 05/10/24   Babs Arthea DASEN, MD  hydrochlorothiazide (HYDRODIURIL) 25 MG tablet Take 1 tablet (25 mg total) by mouth daily. 06/28/24   Rollene Almarie LABOR, MD  metoprolol  succinate (TOPROL -XL) 50 MG 24 hr tablet Take 1 tablet (50 mg total) by mouth in the morning and at bedtime. 06/11/24   Alvia [name] CROME, FNP  moxifloxacin (VIGAMOX) 0.5 % ophthalmic solution Place 1 drop into the left eye 3  (three) times daily.    [provider]  Multiple Vitamin (MULTIVITAMIN) tablet Take 1 tablet by mouth daily.    [provider]  prednisoLONE acetate (PRED FORTE) 1 % ophthalmic suspension Place 1 drop into the left eye every morning. 11/23/21   [provider]  UNABLE TO FIND Med Name: hair growth vitamin    [provider]    Allergies: Hydrocodone     Review of Systems  Gastrointestinal:  Positive for abdominal pain.    Updated Vital Signs BP (!) 147/97   Pulse (!) 102   Temp 98 F (36.7 C) (Oral)   Resp 15   LMP 05/01/2009 Comment: not sexually active  SpO2 100%   Physical Exam Vitals and nursing note reviewed.  Abdominal:     Tenderness: There is abdominal tenderness.     Comments: Mild lower abdominal tenderness without rebound or guarding.  No hernia palpated.  Neurological:     Mental Status: She is alert.     Comments: Chronic left-sided facial droop.     (all labs ordered are listed, but only abnormal results are displayed) Labs Reviewed  COMPREHENSIVE METABOLIC PANEL WITH GFR - Abnormal; Notable for the following components:      Result Value   Glucose, Bld 128 (*)    All other components within normal limits  CBC WITH DIFFERENTIAL/PLATELET - Abnormal; Notable for the following components:  WBC 11.4 (*)    Neutro Abs 9.2 (*)    All other components within normal limits  LIPASE, BLOOD    EKG: None  Radiology: CT ABDOMEN PELVIS W CONTRAST Result Date: 07/03/2024 EXAM: CT ABDOMEN AND PELVIS WITH CONTRAST 07/03/2024 12:53:01 PM TECHNIQUE: CT of the abdomen and pelvis was performed with the administration of 100 mL of iohexol  (OMNIPAQUE ) 300 MG/ML solution. Multiplanar reformatted images are provided for review. Automated exposure control, iterative reconstruction, and/or weight-based adjustment of the mA/kV was utilized to reduce the radiation dose to as low as reasonably achievable. COMPARISON: 01/14/2020 CLINICAL HISTORY:  LLQ abdominal pain. FINDINGS: LOWER CHEST: No acute abnormality. LIVER: Probable cyst or hemangioma seen in posterior segment of right hepatic lobe. GALLBLADDER AND BILE DUCTS: Gallbladder is unremarkable. No biliary ductal dilatation. SPLEEN: No acute abnormality. PANCREAS: No acute abnormality. ADRENAL GLANDS: No acute abnormality. KIDNEYS, URETERS AND BLADDER: No stones in the kidneys or ureters. No hydronephrosis. No perinephric or periureteral stranding. Urinary bladder is unremarkable. GI AND BOWEL: Postsurgical changes are seen involving small bowel and right side of the abdomen. Stomach demonstrates no acute abnormality. There is no bowel obstruction. PERITONEUM AND RETROPERITONEUM: No ascites. No free air. VASCULATURE: Aorta is normal in caliber. LYMPH NODES: No lymphadenopathy. REPRODUCTIVE ORGANS: No acute abnormality. BONES AND SOFT TISSUES: Status post left total hip arthroplasty. No acute osseous abnormality. No focal soft tissue abnormality. IMPRESSION: 1. No acute findings in the abdomen or pelvis. 2. Probable benign hepatic lesion in the posterior right hepatic lobe, likely cyst or hemangioma. Electronically signed by: Lynwood Seip MD 07/03/2024 01:07 PM EDT RP Workstation: [id number]     Procedures   Medications Ordered in the ED  sodium chloride  0.9 % bolus 500 mL (500 mLs Intravenous New Bag/Given 07/03/24 1159)  iohexol  (OMNIPAQUE ) 300 MG/ML solution 100 mL (100 mLs Intravenous Contrast Given 07/03/24 1244)                                    Medical Decision Making Amount and/or Complexity of Data Reviewed Labs: ordered. Radiology: ordered.  Risk Prescription drug management.  Patient with abdominal pain.  Differential diagnoses longed.  Comes and goes.  Workup reassuring.  Blood work reassuring.  No obstruction seen.  CT scan reassuring.  Can follow-up with GI who she has seen before.  Doubt that new intracranial pathology is the cause of this.      Final diagnoses:   Abdominal pain, unspecified abdominal location    ED Discharge Orders          Ordered    ondansetron  (ZOFRAN -ODT) 4 MG disintegrating tablet  Every 8 hours PRN        07/03/24 1324               Patsey Lot, MD 07/03/24 1328

## 2024-07-14 ENCOUNTER — Ambulatory Visit: Admitting: Internal Medicine

## 2024-07-14 ENCOUNTER — Encounter: Payer: Self-pay | Admitting: Internal Medicine

## 2024-07-14 VITALS — BP 130/80 | HR 82 | Temp 98.5°F | Ht 64.0 in | Wt 167.0 lb

## 2024-07-14 DIAGNOSIS — K219 Gastro-esophageal reflux disease without esophagitis: Secondary | ICD-10-CM

## 2024-07-14 DIAGNOSIS — I10 Essential (primary) hypertension: Secondary | ICD-10-CM

## 2024-07-14 DIAGNOSIS — R7303 Prediabetes: Secondary | ICD-10-CM

## 2024-07-14 DIAGNOSIS — Z23 Encounter for immunization: Secondary | ICD-10-CM

## 2024-07-14 LAB — CBC WITH DIFFERENTIAL/PLATELET
Basophils Absolute: 0 K/uL (ref 0.0–0.1)
Basophils Relative: 0.5 % (ref 0.0–3.0)
Eosinophils Absolute: 0.3 K/uL (ref 0.0–0.7)
Eosinophils Relative: 3.8 % (ref 0.0–5.0)
HCT: 40.7 % (ref 36.0–46.0)
Hemoglobin: 13.7 g/dL (ref 12.0–15.0)
Lymphocytes Relative: 23.5 % (ref 12.0–46.0)
Lymphs Abs: 1.6 K/uL (ref 0.7–4.0)
MCHC: 33.7 g/dL (ref 30.0–36.0)
MCV: 83.7 fl (ref 78.0–100.0)
Monocytes Absolute: 0.4 K/uL (ref 0.1–1.0)
Monocytes Relative: 6.2 % (ref 3.0–12.0)
Neutro Abs: 4.4 K/uL (ref 1.4–7.7)
Neutrophils Relative %: 66 % (ref 43.0–77.0)
Platelets: 293 K/uL (ref 150.0–400.0)
RBC: 4.87 Mil/uL (ref 3.87–5.11)
RDW: 14.1 % (ref 11.5–15.5)
WBC: 6.6 K/uL (ref 4.0–10.5)

## 2024-07-14 LAB — HEMOGLOBIN A1C: Hgb A1c MFr Bld: 6.3 % (ref 4.6–6.5)

## 2024-07-14 LAB — COMPREHENSIVE METABOLIC PANEL WITH GFR
ALT: 16 U/L (ref 0–35)
AST: 13 U/L (ref 0–37)
Albumin: 4.4 g/dL (ref 3.5–5.2)
Alkaline Phosphatase: 71 U/L (ref 39–117)
BUN: 12 mg/dL (ref 6–23)
CO2: 31 meq/L (ref 19–32)
Calcium: 10.4 mg/dL (ref 8.4–10.5)
Chloride: 99 meq/L (ref 96–112)
Creatinine, Ser: 0.86 mg/dL (ref 0.40–1.20)
GFR: 72.56 mL/min (ref >60.00)
Glucose, Bld: 96 mg/dL (ref 70–99)
Potassium: 3.8 meq/L (ref 3.5–5.1)
Sodium: 138 meq/L (ref 135–145)
Total Bilirubin: 0.3 mg/dL (ref 0.2–1.2)
Total Protein: 7.5 g/dL (ref 6.0–8.3)

## 2024-07-14 MED ORDER — OMEPRAZOLE 40 MG PO CPDR: 40.0000 mg | DELAYED_RELEASE_CAPSULE | Freq: Every day | ORAL | 3 refills | Status: DC

## 2024-07-14 NOTE — Patient Instructions (Addendum)
 We will send in omeprazole  40 mg daily to take with breakfast or first meal of day. Take this for 4 weeks or until you see GI. If no improvement within 1 week let us  know.

## 2024-07-14 NOTE — Progress Notes (Signed)
 Subjective:   Patient ID: Cristina Conner, female    DOB: [birth year], 62 y.o.   MRN: [id number]  Discussed the use of AI scribe software for clinical note transcription with the patient, who gave verbal consent to proceed.  History of Present Illness Cristina Conner is a 63 year old female with a history of gastrointestinal issues who presents with worsening acid reflux and abdominal pain.  She has been experiencing significant acid reflux and abdominal pain for three weeks, severe enough to confine her to bed. The pain varies between sharp and pressure-like sensations. She has been using Pepcid  without relief and is seeking stronger medication.  She has a history of a narrowing in her small intestine, identified during a previous hospital visit. Although a CT scan at that time did not show an obstruction, she experienced relief after drinking prune juice, which facilitated a bowel movement and reduced bloating.  She experiences burping and notes that her symptoms worsen when her stomach is empty. Sitting up for a couple of hours after eating provides some relief.  No new breathing troubles, chest pressure, or racing heart, except for pressure related to food intake. Her blood pressure readings at home have improved, with most readings in the 110s to 120s, down from previous readings of 140s to 150s.  She is concerned about her white blood cell count, which was slightly elevated during her hospital visit, although she has not had any recent colds or infections.  Review of Systems  Constitutional: Negative.   HENT:  Positive for trouble swallowing.   Eyes: Negative.   Respiratory:  Negative for cough, chest tightness and shortness of breath.   Cardiovascular:  Negative for chest pain, palpitations and leg swelling.  Gastrointestinal:  Positive for abdominal distention, abdominal pain and nausea. Negative for constipation, diarrhea and vomiting.  Musculoskeletal: Negative.   Skin:  Negative.   Neurological: Negative.   Psychiatric/Behavioral: Negative.      Objective:  Physical Exam Constitutional:      Appearance: She is well-developed.  HENT:     Head: Normocephalic and atraumatic.  Cardiovascular:     Rate and Rhythm: Normal rate and regular rhythm.  Pulmonary:     Effort: Pulmonary effort is normal. No respiratory distress.     Breath sounds: Normal breath sounds. No wheezing or rales.  Abdominal:     General: Bowel sounds are normal. There is no distension.     Palpations: Abdomen is soft.     Tenderness: There is abdominal tenderness.  Musculoskeletal:     Cervical back: Normal range of motion.  Skin:    General: Skin is warm and dry.  Neurological:     Mental Status: She is alert and oriented to person, place, and time.     Coordination: Coordination normal.     Vitals:   07/14/24 1102  BP: 130/80  Pulse: 82  Temp: 98.5 F (36.9 C)  TempSrc: Oral  SpO2: 98%  Weight: 167 lb (75.8 kg)  Height: 5' 4 (1.626 m)  Flu shot given at visit  Assessment and Plan Assessment & Plan Gastroesophageal reflux disease with esophageal symptoms   Chronic GERD worsens when lying down. Omeprazole  is preferred for stronger acid suppression to prevent potential esophageal damage. Prescribed omeprazole  40 mg daily, one tablet daily before the first meal. Advised to avoid lying down within two hours of eating.  Small bowel stricture   CT scan shows small intestine narrowing without obstruction. Symptoms improve with prune juice. Continue  prune juice as needed for bowel regularity.  Hypertension   Blood pressure is well-controlled with current medication, with home readings mostly in the 120s and 130s. Continue current antihypertensive regimen.  Prediabetes   There is concern for blood glucose control. Plan to recheck A1c. Ordered A1c test to evaluate blood glucose control.

## 2024-07-15 ENCOUNTER — Ambulatory Visit: Payer: Self-pay | Admitting: Internal Medicine

## 2024-07-15 DIAGNOSIS — R7303 Prediabetes: Secondary | ICD-10-CM

## 2024-07-15 NOTE — Assessment & Plan Note (Signed)
 There is concern for blood glucose control. Plan to recheck A1c. Ordered A1c test to evaluate blood glucose control.

## 2024-07-15 NOTE — Assessment & Plan Note (Signed)
 Blood pressure is well-controlled with current medication, with home readings mostly in the 120s and 130s. Continue current antihypertensive regimen. Checking CMP.

## 2024-07-15 NOTE — Assessment & Plan Note (Signed)
 Chronic GERD worsens when lying down. Omeprazole  is preferred for stronger acid suppression to prevent potential esophageal damage. Prescribed omeprazole  40 mg daily, one tablet daily before the first meal. Advised to avoid lying down within two hours of eating. With new dysphagia if still present when she sees GI in 1 month will need EGD.

## 2024-08-11 ENCOUNTER — Other Ambulatory Visit: Payer: Self-pay | Admitting: Physical Medicine & Rehabilitation

## 2024-08-11 DIAGNOSIS — D333 Benign neoplasm of cranial nerves: Secondary | ICD-10-CM

## 2024-08-11 DIAGNOSIS — G510 Bell's palsy: Secondary | ICD-10-CM

## 2024-08-19 ENCOUNTER — Ambulatory Visit: Admitting: Nurse Practitioner

## 2024-08-19 ENCOUNTER — Encounter: Payer: Self-pay | Admitting: Nurse Practitioner

## 2024-08-19 VITALS — BP 110/74 | HR 96 | Ht 65.25 in | Wt 161.1 lb

## 2024-08-19 DIAGNOSIS — K219 Gastro-esophageal reflux disease without esophagitis: Secondary | ICD-10-CM

## 2024-08-19 DIAGNOSIS — R0789 Other chest pain: Secondary | ICD-10-CM | POA: Diagnosis not present

## 2024-08-19 DIAGNOSIS — K30 Functional dyspepsia: Secondary | ICD-10-CM | POA: Diagnosis not present

## 2024-08-19 DIAGNOSIS — K5900 Constipation, unspecified: Secondary | ICD-10-CM

## 2024-08-19 NOTE — Patient Instructions (Signed)
°  Continue Omeprazole  40mg  once daily to be taken 30 minutes before breakfast   Avoid spicy, acidic and fatty/fried foods   Take Miralax  1 capful mixed in 8 ounces of water  at bed time for constipation as tolerated.  Christina Barnacle NP a [facility] with an update in 2 to 3 weeks.  Contact our office if your esophageal symptoms recur or worsen

## 2024-08-19 NOTE — Progress Notes (Signed)
 08/19/2024 Cristina Conner [id number] [birth year]   Chief Complaint: Acid reflux  History of Present Illness: Cristina Conner. Cristina Conner is a 63 year old female with a past medical history of hypertension, hyperlipidemia, prediabetes, vestibular schwannoma s/p resection, neuropathic pain and constipation.  Previously followed by Dr. Avram then transitioned to Dr. Albertus.  Discussed the use of AI scribe software for clinical note transcription with the patient, who gave verbal consent to proceed.  History of Present Illness Cristina Conner is a 63 year old female who presents with esophageal pressure and suspected acid reflux. She was referred by her primary care physician for evaluation of gastrointestinal symptoms.  She has been experiencing significant pressure in the esophageal area, particularly between her breasts, for about three weeks starting in early October which occurs 30 minutes after eating. Initially, she attributed the symptoms to acid reflux and tried Pepcid , taking it approximately twelve times over three weeks without relief.  She had worsening symptoms and presented to the ED 07/03/2024 for further evaluation.  Labs in the ED showed a WBC count 11.4.  Hemoglobin 13.9.  Normal LFTs and lipase level.  CTAP without acute intra-abdominal/pelvic pathology to explain her symptoms and showed a benign right liver lesion, likely cyst or hemangioma.  Following the above ED visit, she was seen by her PCP who prescribed Omeprazole  40 mg once daily in the morning on an empty stomach. She waits thirty minutes before eating and sits up after meals, which has alleviated her symptoms. No current symptoms of burning, heartburn, or difficulty swallowing. The pressure typically occurs about thirty minutes after eating and is not associated with exertion.  She underwent an EGD with Dr. Avram 10/13/2019 due to N/V and epigastric pain which was normal.  CT imaging showed evidence of enteritis and she  underwent a small bowel endoscopy/08/2020 which was normal.  Biopsies were negative for celiac disease.  A small bowel capsule endoscopy was attempted 12/28/2019, the study was incomplete as a capsule was retained in the small bowel.  A flex sig 01/14/2020 showed no evidence of the capsule in the left colon.   She also experiences constipation, initially having bowel movements every three days. She now drinks prune juice with lemon every other evening, which has improved her regularity to having bowel movements every day or every other day. Her stools are normal in color and consistency, with no blood or black discoloration.  She underwent a colonoscopy 06/10/2023 which showed an ileal diverticulum, diverticulosis in the descending, transverse and ascending colon without evidence of colon polyps.  She was advised to repeat a colonoscopy in 10 years.      Latest Ref Rng & Units 07/14/2024   11:23 AM 07/03/2024   12:00 PM 06/10/2024   11:24 AM  CBC  WBC 4.0 - 10.5 K/uL 6.6  11.4  6.3   Hemoglobin 12.0 - 15.0 g/dL 86.2  86.0  86.1   Hematocrit 36.0 - 46.0 % 40.7  42.6  43.0   Platelets 150.0 - 400.0 K/uL 293.0  282  283.0        Latest Ref Rng & Units 07/14/2024   11:23 AM 07/03/2024   12:00 PM 06/10/2024   11:24 AM  CMP  Glucose 70 - 99 mg/dL 96  871  886   BUN 6 - 23 mg/dL 12  12  16    Creatinine 0.40 - 1.20 mg/dL 9.13  9.12  9.16   Sodium 135 - 145 mEq/L 138  137  138   Potassium 3.5 - 5.1 mEq/L 3.8  3.5  4.3   Chloride 96 - 112 mEq/L 99  100  101   CO2 19 - 32 mEq/L 31  26  31    Calcium 8.4 - 10.5 mg/dL 89.5  89.6  9.4   Total Protein 6.0 - 8.3 g/dL 7.5  7.6  7.3   Total Bilirubin 0.2 - 1.2 mg/dL 0.3  0.5  0.4   Alkaline Phos 39 - 117 U/L 71  90  67   AST 0 - 37 U/L 13  22  12    ALT 0 - 35 U/L 16  22  12      Colonoscopy 06/10/2023: - Ileal diverticulum. Normal examined ileal mucosa.  - Mild, scattered, diverticulosis in the descending colon, in the transverse colon and in the  ascending colon.  - The examination was otherwise normal on direct and retroflexion views.  - No specimens collected.  - Recall colonoscopy 10 years   Flex sigmoidoscopy 01/14/2020: - The perianal and digital rectal examinations were normal.  - No capsule (foreign body) seen in the colon. Entire colon examined. Left colon good prep.   Small bowel capsule endoscopy 12/28/2019: Incomplete capsule study, capsule retained in the small bowel  Small bowel endoscopy 12/13/2019: - Normal esophagus.  -Normal stomach.  - Normal examined duodenum. - The examined portion of the jejunum was normal. Biopsied. - SMALL BOWEL MUCOSA WITH NO SIGNIFICANT HISTOPATHOLOGIC CHANGES. - NO FEATURES OF SPRUE, ACTIVE INFLAMMATION, CHRONIC CHANGES OR GRANULOMAS  EGD 10/13/2019: - Normal esophagus.  - Gastroesophageal flap valve classified as Hill Grade II (fold present, opens with respiration).  - Normal stomach.  - Normal examined duodenum. - No specimens collected.  Colonoscopy 10/13/2012: - Diverticulosis in the ascending colon - Colonoscopy 10 years  Medications Ordered Prior to Encounter[1] Allergies[2]  Current Medications, Allergies, Past Medical History, Past Surgical History, Family History and Social History were reviewed in [employer] record.  Review of Systems:   Constitutional: Negative for fever, sweats, chills or weight loss.  Respiratory: Negative for shortness of breath.   Cardiovascular: Negative for chest pain, palpitations and leg swelling.  Gastrointestinal: See HPI.  Musculoskeletal: Negative for back pain or muscle aches.  Neurological: Left  facial pain.   Physical Exam: BP 110/74 (BP Location: Left Arm, Patient Position: Sitting, Cuff Size: Normal)   Pulse 96   Ht 5' 5.25 (1.657 m) Comment: height measured without shoes  Wt 161 lb 2 oz (73.1 kg)   LMP 05/01/2009 Comment: not sexually active  BMI 26.61 kg/m   General: 63 year old female in no acute  distress. Head: Left facial/jaw deformity. Chronic left facial droop.  Eyes: No scleral icterus. Conjunctiva pink . Ears: Normal auditory acuity. Mouth: Dentition intact. No ulcers or lesions.  Reduced mouth/jaw range of motion, cannot open mouth widely. Lungs: Clear throughout to auscultation. Heart: Regular rate and rhythm, no murmur. Abdomen: Soft, nontender and nondistended. No masses or hepatomegaly. Normal bowel sounds x 4 quadrants.  Rectal: Deferred. Musculoskeletal: Symmetrical with no gross deformities. Extremities: No edema. Neurological: Alert oriented x 4. No focal deficits.  Psychological: Alert and cooperative. Normal mood and affect  Assessment and Recommendations:  63 year old female with indigestion, esophageal/chest pressure which occurs 30 minutes after eating and unrelated to activity/exertion. Possible acid reflux as her symptoms nearly resolved after initiating Omeprazole  40 mg once daily per her PCP.  Normal LFTs and lipase level. CTAP 07/03/2024 without acute intra-abdominal/pelvic pathology to explain her symptoms. -  Continue Omeprazole  40 mg once daily - GERD diet - Avoid eating 3 hours before going to bed - EGD to be scheduled if symptoms persist or worsen. EGD benefits and risks discussed including risk with sedation, risk of bleeding, perforation and infection. If future EGD pursued, will likely need to be performed in the hospital setting as patient has reduced oral/jaw ROM s/p left craniotomy for tumor resection 01/2020 - Patient instructed to provide symptom update via MyChart messaging in 2 to 3 weeks - Recommend cardiac evaluation if chest pressure recurs  Colon cancer screening. Colonoscopy 06/2023 did not identify any colon polyps. 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 30 tablet 0   calcium carbonate (OSCAL) 1500 (600 Ca) MG TABS tablet Take by mouth daily.     clobetasol  cream (TEMOVATE ) 0.05 % Apply 1 Application topically as needed.     cyclobenzaprine  (FLEXERIL ) 5 MG tablet TAKE 1 TABLET BY MOUTH THREE TIMES A DAY AS NEEDED FOR MUSCLE SPASM 30 tablet 3   Docusate Sodium  (DSS) 100 MG CAPS Take by mouth.     gabapentin  (NEURONTIN ) 300 MG capsule TAKE 1 CAPSULE BY MOUTH THREE TIMES A DAY 90 capsule 4   hydrochlorothiazide  (HYDRODIURIL ) 25 MG tablet Take 1 tablet (25 mg total) by mouth daily. 90 tablet 0   metoprolol  succinate (TOPROL -XL) 50 MG 24 hr tablet Take 1 tablet (50 mg total) by mouth in the morning and at bedtime. 60 tablet 0   moxifloxacin (VIGAMOX) 0.5 % ophthalmic solution Place 1 drop into the left eye 3 (three) times daily.     Multiple Vitamin (MULTIVITAMIN) tablet Take 1 tablet by mouth daily.     omeprazole  (PRILOSEC) 40 MG capsule Take 1 capsule (40 mg total) by mouth daily. 30 capsule 3   ondansetron  (ZOFRAN -ODT) 4 MG disintegrating tablet Take 1 tablet (4 mg total) by mouth every 8 (eight) hours as needed. 8 tablet 0   prednisoLONE acetate (PRED FORTE) 1 % ophthalmic suspension Place 1 drop into the left eye every morning.     UNABLE TO FIND Med Name: hair growth vitamin     No current facility-administered medications on file prior to visit.  [2]  Allergies Allergen Reactions   Hydrocodone  Nausea And Vomiting

## 2024-08-23 ENCOUNTER — Encounter: Admitting: Dietician

## 2024-08-23 ENCOUNTER — Encounter: Payer: Self-pay | Admitting: Dietician

## 2024-08-23 VITALS — Wt 164.0 lb

## 2024-08-23 DIAGNOSIS — R7303 Prediabetes: Secondary | ICD-10-CM | POA: Insufficient documentation

## 2024-08-23 NOTE — Progress Notes (Signed)
" °  Medical Nutrition Therapy  Appointment Start time:  [phone]  Appointment End time:  1045  Primary concerns today: How to prevent diabetes.  What to eat. She states that she has cut out her sugar containing drinks.    Referral diagnosis: prediabetes Preferred learning style: auditory Learning readiness: ready, change in progress   NUTRITION ASSESSMENT  66164 lbs 08/23/2024 117 lbs 2023 due to a tumor on the side of her face/ear 145 lbs 2023 UBW 150 lbs goal weight  Clinical Medical Hx: HTN, osteopenia, Vestibular neuroma with removal 2023 Medications: see list Labs: A1C 6.3% 07/14/2024 and 6.2% 01/06/2024 Notable Signs/Symptoms: none  Lifestyle & Dietary Hx Patient lives alone.  She buys most of her meals out.  Occasionally cooks.  Her husband passed in 2020.  Her daughter is a human resources officer. Patient retired - social research officer, government. Dislikes eggs  Estimated daily fluid intake: 64 oz Supplements: Calcium, Vitamin D , MVI Sleep: good Stress / self-care: normal Current average weekly physical activity: Was very active since the tumor as she has poor balance due to her surgery.  Walks without problems.  24-Hr Dietary Recall First Meal: cereal (trader jo's wheat flakes with strawberries), whole milk Snack: none Second Meal: [place], chicken wings Snack: none Third Meal: 1/2 grilled chicken salad Snack: none Beverages: water , apple juice, rare gingerale   NUTRITION DIAGNOSIS  NB-1.1 Food and nutrition-related knowledge deficit As related to balance of carbohydrates, protein, and fat.  As evidenced by diet hx and patient report.   NUTRITION INTERVENTION  Nutrition education (E-1) on the following topics:  Insulin  resistance and diabetes Parameters for diagnosis of prediabetes and diabetes Exercise and benefits to prevent diabetes - goals discussed Fats, types of fats, tips to decrease fat intake and benefits with diabetes Label reading Snack ideas Recipe  resources Portion of plate carbohydrates, protein, fat and affect on blood glucose  Handouts Provided Include  A1C chart Snack list Label reading Types of fats My plate Diabetes resources  Learning Style & Readiness for Change Teaching method utilized: Visual & Auditory  Demonstrated degree of understanding via: Teach Back  Barriers to learning/adherence to lifestyle change: none  Goals Established by Pt Walk 30 minutes most every day. Weights or bands 2 days per week Vitamin D3K2  Mindful choices when eating out  Eat until satisfied  Baked or grilled rather than fried  Dressing, butter, sour cream on the side  Side option of salad or vege rather than fries most often   Great job on changing your drinks!  When you have Gingerale, try Zevia or very small portions  Lower fat, particularly lower saturated fat  Baked or grilled   Less cheese  Less added fat such as butter and sour cream   MONITORING & EVALUATION Dietary intake, weekly physical activity prn  Next Steps  Patient is to call for questions.  "

## 2024-08-23 NOTE — Patient Instructions (Addendum)
 Walk 30 minutes most every day. Weights or bands 2 days per week Vitamin D3K2  Mindful choices when eating out  Eat until satisfied  Baked or grilled rather than fried  Dressing, butter, sour cream on the side  Side option of salad or vege rather than fries most often   Great job on changing your drinks!  When you have Gingerale, try Zevia or very small portions  Lower fat, particularly lower saturated fat  Baked or grilled   Less cheese  Less added fat such as butter and sour cream

## 2024-09-23 ENCOUNTER — Other Ambulatory Visit: Payer: Self-pay | Admitting: Internal Medicine

## 2024-10-05 ENCOUNTER — Other Ambulatory Visit: Payer: Self-pay | Admitting: Internal Medicine

## 2024-10-06 ENCOUNTER — Other Ambulatory Visit: Payer: Self-pay | Admitting: Physical Medicine & Rehabilitation

## 2024-10-06 DIAGNOSIS — G894 Chronic pain syndrome: Secondary | ICD-10-CM

## 2024-10-15 ENCOUNTER — Ambulatory Visit: Admitting: Internal Medicine

## 2025-01-11 ENCOUNTER — Ambulatory Visit: Admitting: Obstetrics and Gynecology

## 2025-06-08 ENCOUNTER — Ambulatory Visit: Admitting: Physical Medicine & Rehabilitation
